# Patient Record
Sex: Male | Born: 1935 | Race: White | Hispanic: No | Marital: Married | Smoking: Never smoker
Health system: Southern US, Community
[De-identification: ages and names within clinical notes are randomized; demographics above are authoritative.]

## PROBLEM LIST (undated history)

## (undated) DIAGNOSIS — Z8719 Personal history of other diseases of the digestive system: Secondary | ICD-10-CM

## (undated) DIAGNOSIS — J189 Pneumonia, unspecified organism: Secondary | ICD-10-CM

## (undated) DIAGNOSIS — I10 Essential (primary) hypertension: Secondary | ICD-10-CM

## (undated) DIAGNOSIS — E049 Nontoxic goiter, unspecified: Secondary | ICD-10-CM

## (undated) DIAGNOSIS — I428 Other cardiomyopathies: Secondary | ICD-10-CM

## (undated) DIAGNOSIS — Z95810 Presence of automatic (implantable) cardiac defibrillator: Secondary | ICD-10-CM

## (undated) DIAGNOSIS — R0601 Orthopnea: Secondary | ICD-10-CM

## (undated) DIAGNOSIS — N400 Enlarged prostate without lower urinary tract symptoms: Secondary | ICD-10-CM

## (undated) DIAGNOSIS — J329 Chronic sinusitis, unspecified: Secondary | ICD-10-CM

## (undated) DIAGNOSIS — R0981 Nasal congestion: Secondary | ICD-10-CM

## (undated) DIAGNOSIS — I447 Left bundle-branch block, unspecified: Secondary | ICD-10-CM

## (undated) DIAGNOSIS — I728 Aneurysm of other specified arteries: Secondary | ICD-10-CM

## (undated) DIAGNOSIS — R0600 Dyspnea, unspecified: Secondary | ICD-10-CM

## (undated) DIAGNOSIS — D649 Anemia, unspecified: Secondary | ICD-10-CM

## (undated) DIAGNOSIS — Z889 Allergy status to unspecified drugs, medicaments and biological substances status: Secondary | ICD-10-CM

## (undated) DIAGNOSIS — K589 Irritable bowel syndrome without diarrhea: Secondary | ICD-10-CM

## (undated) DIAGNOSIS — W19XXXA Unspecified fall, initial encounter: Secondary | ICD-10-CM

## (undated) DIAGNOSIS — R918 Other nonspecific abnormal finding of lung field: Secondary | ICD-10-CM

## (undated) DIAGNOSIS — M1990 Unspecified osteoarthritis, unspecified site: Secondary | ICD-10-CM

## (undated) DIAGNOSIS — I4892 Unspecified atrial flutter: Secondary | ICD-10-CM

## (undated) DIAGNOSIS — Z950 Presence of cardiac pacemaker: Secondary | ICD-10-CM

## (undated) DIAGNOSIS — J984 Other disorders of lung: Secondary | ICD-10-CM

## (undated) DIAGNOSIS — K219 Gastro-esophageal reflux disease without esophagitis: Secondary | ICD-10-CM

## (undated) DIAGNOSIS — F419 Anxiety disorder, unspecified: Secondary | ICD-10-CM

## (undated) DIAGNOSIS — I5022 Chronic systolic (congestive) heart failure: Secondary | ICD-10-CM

## (undated) HISTORY — DX: Chronic sinusitis, unspecified: J32.9

## (undated) HISTORY — PX: CORONARY ANGIOPLASTY: SHX604

## (undated) HISTORY — DX: Other disorders of lung: J98.4

## (undated) HISTORY — PX: INSERT / REPLACE / REMOVE PACEMAKER: SUR710

## (undated) HISTORY — DX: Nontoxic goiter, unspecified: E04.9

## (undated) HISTORY — DX: Nasal congestion: R09.81

## (undated) HISTORY — DX: Unspecified porphyria: E80.20

## (undated) HISTORY — PX: CHOLECYSTECTOMY: SHX55

## (undated) HISTORY — DX: Other nonspecific abnormal finding of lung field: R91.8

## (undated) HISTORY — DX: Chronic systolic (congestive) heart failure: I50.22

## (undated) HISTORY — DX: Gastro-esophageal reflux disease without esophagitis: K21.9

## (undated) HISTORY — DX: Benign prostatic hyperplasia without lower urinary tract symptoms: N40.0

## (undated) HISTORY — PX: OTHER SURGICAL HISTORY: SHX169

## (undated) HISTORY — PX: HERNIA REPAIR: SHX51

## (undated) HISTORY — PX: JOINT REPLACEMENT: SHX530

## (undated) HISTORY — DX: Unspecified fall, initial encounter: W19.XXXA

## (undated) HISTORY — DX: Aneurysm of other specified arteries: I72.8

## (undated) HISTORY — DX: Irritable bowel syndrome, unspecified: K58.9

## (undated) HISTORY — DX: Personal history of other diseases of the digestive system: Z87.19

## (undated) HISTORY — DX: Left bundle-branch block, unspecified: I44.7

## (undated) HISTORY — DX: Unspecified atrial flutter: I48.92

## (undated) HISTORY — DX: Other cardiomyopathies: I42.8

## (undated) HISTORY — DX: Pneumonia, unspecified organism: J18.9

---

## 1969-04-09 HISTORY — PX: NOSE SURGERY: SHX723

## 1995-04-10 HISTORY — PX: BACK SURGERY: SHX140

## 1996-05-18 ENCOUNTER — Encounter: Payer: Self-pay | Admitting: Family Medicine

## 1996-05-18 LAB — CONVERTED CEMR LAB: TSH: 1.67 microintl units/mL

## 1997-12-08 DIAGNOSIS — K589 Irritable bowel syndrome without diarrhea: Secondary | ICD-10-CM | POA: Insufficient documentation

## 1998-01-07 HISTORY — PX: COLONOSCOPY: SHX174

## 1998-01-07 HISTORY — PX: ESOPHAGOGASTRODUODENOSCOPY: SHX1529

## 1998-01-07 LAB — HM COLONOSCOPY

## 1998-02-08 ENCOUNTER — Encounter: Payer: Self-pay | Admitting: Internal Medicine

## 1998-02-08 ENCOUNTER — Ambulatory Visit (HOSPITAL_COMMUNITY): Admission: RE | Admit: 1998-02-08 | Discharge: 1998-02-08 | Payer: Self-pay | Admitting: Internal Medicine

## 1998-02-11 ENCOUNTER — Encounter: Payer: Self-pay | Admitting: Internal Medicine

## 1998-02-11 ENCOUNTER — Ambulatory Visit (HOSPITAL_COMMUNITY): Admission: RE | Admit: 1998-02-11 | Discharge: 1998-02-11 | Payer: Self-pay | Admitting: Internal Medicine

## 1998-02-18 HISTORY — PX: ESOPHAGEAL DILATION: SHX303

## 2000-05-10 ENCOUNTER — Encounter: Payer: Self-pay | Admitting: Family Medicine

## 2000-05-10 LAB — CONVERTED CEMR LAB: PSA: 1.65 ng/mL

## 2000-05-21 ENCOUNTER — Encounter: Payer: Self-pay | Admitting: Family Medicine

## 2000-05-21 LAB — CONVERTED CEMR LAB: PSA: 1.65 ng/mL

## 2000-05-27 ENCOUNTER — Encounter: Payer: Self-pay | Admitting: Family Medicine

## 2000-05-27 LAB — CONVERTED CEMR LAB
Blood Glucose, Fasting: 81 mg/dL
RBC count: 5.02 10*6/uL
TSH: 1.746 microintl units/mL
WBC, blood: 6.4 10*3/uL

## 2001-04-08 ENCOUNTER — Ambulatory Visit (HOSPITAL_COMMUNITY): Admission: RE | Admit: 2001-04-08 | Discharge: 2001-04-08 | Payer: Self-pay | Admitting: Internal Medicine

## 2001-04-08 HISTORY — PX: ESOPHAGOGASTRODUODENOSCOPY: SHX1529

## 2002-01-30 ENCOUNTER — Ambulatory Visit (HOSPITAL_BASED_OUTPATIENT_CLINIC_OR_DEPARTMENT_OTHER): Admission: RE | Admit: 2002-01-30 | Discharge: 2002-01-30 | Payer: Self-pay | Admitting: Surgery

## 2002-01-30 HISTORY — PX: HERNIA REPAIR: SHX51

## 2002-04-09 HISTORY — PX: OTHER SURGICAL HISTORY: SHX169

## 2002-04-17 ENCOUNTER — Encounter: Payer: Self-pay | Admitting: Family Medicine

## 2002-04-17 LAB — CONVERTED CEMR LAB
Blood Glucose, Fasting: 93 mg/dL
PSA: 1.1 ng/mL
RBC count: 5.06 10*6/uL
TSH: 1.82 microintl units/mL
WBC, blood: 4.4 10*3/uL

## 2003-07-06 ENCOUNTER — Encounter: Payer: Self-pay | Admitting: Family Medicine

## 2003-07-06 LAB — CONVERTED CEMR LAB
Blood Glucose, Fasting: 89 mg/dL
RBC count: 5.3 10*6/uL
TSH: 1.71 microintl units/mL
WBC, blood: 7.2 10*3/uL

## 2003-08-06 ENCOUNTER — Other Ambulatory Visit: Payer: Self-pay

## 2004-05-31 ENCOUNTER — Ambulatory Visit: Payer: Self-pay | Admitting: Family Medicine

## 2004-06-09 ENCOUNTER — Ambulatory Visit: Payer: Self-pay | Admitting: Family Medicine

## 2004-06-15 ENCOUNTER — Ambulatory Visit: Payer: Self-pay | Admitting: Cardiology

## 2004-06-22 ENCOUNTER — Inpatient Hospital Stay (HOSPITAL_BASED_OUTPATIENT_CLINIC_OR_DEPARTMENT_OTHER): Admission: RE | Admit: 2004-06-22 | Discharge: 2004-06-22 | Payer: Self-pay | Admitting: Cardiology

## 2004-06-22 ENCOUNTER — Ambulatory Visit: Payer: Self-pay | Admitting: Cardiology

## 2004-06-22 HISTORY — PX: CARDIAC CATHETERIZATION: SHX172

## 2004-07-04 ENCOUNTER — Ambulatory Visit: Payer: Self-pay | Admitting: Cardiology

## 2004-07-13 ENCOUNTER — Ambulatory Visit: Payer: Self-pay

## 2004-07-13 HISTORY — PX: US ECHOCARDIOGRAPHY: HXRAD669

## 2004-07-27 ENCOUNTER — Ambulatory Visit: Payer: Self-pay | Admitting: Cardiology

## 2004-10-23 ENCOUNTER — Ambulatory Visit: Payer: Self-pay | Admitting: Internal Medicine

## 2004-11-06 ENCOUNTER — Ambulatory Visit: Payer: Self-pay | Admitting: Internal Medicine

## 2004-11-23 ENCOUNTER — Ambulatory Visit: Payer: Self-pay | Admitting: Family Medicine

## 2004-11-27 ENCOUNTER — Ambulatory Visit: Payer: Self-pay | Admitting: Family Medicine

## 2004-12-04 ENCOUNTER — Ambulatory Visit: Payer: Self-pay | Admitting: Cardiology

## 2004-12-07 ENCOUNTER — Emergency Department (HOSPITAL_COMMUNITY): Admission: EM | Admit: 2004-12-07 | Discharge: 2004-12-07 | Payer: Self-pay | Admitting: Emergency Medicine

## 2004-12-08 ENCOUNTER — Ambulatory Visit: Payer: Self-pay | Admitting: Family Medicine

## 2004-12-13 ENCOUNTER — Ambulatory Visit: Payer: Self-pay | Admitting: Family Medicine

## 2004-12-19 ENCOUNTER — Ambulatory Visit: Payer: Self-pay | Admitting: Internal Medicine

## 2004-12-22 ENCOUNTER — Ambulatory Visit: Payer: Self-pay | Admitting: Internal Medicine

## 2004-12-22 HISTORY — PX: ESOPHAGOGASTRODUODENOSCOPY: SHX1529

## 2005-01-03 ENCOUNTER — Ambulatory Visit: Payer: Self-pay | Admitting: Cardiology

## 2005-01-25 ENCOUNTER — Ambulatory Visit: Payer: Self-pay | Admitting: Cardiology

## 2005-03-06 ENCOUNTER — Ambulatory Visit: Payer: Self-pay | Admitting: Internal Medicine

## 2005-04-09 HISTORY — PX: PENILE PROSTHESIS IMPLANT: SHX240

## 2005-05-01 ENCOUNTER — Ambulatory Visit: Payer: Self-pay | Admitting: Cardiology

## 2005-06-01 ENCOUNTER — Ambulatory Visit (HOSPITAL_COMMUNITY): Admission: RE | Admit: 2005-06-01 | Discharge: 2005-06-02 | Payer: Self-pay | Admitting: Urology

## 2005-06-29 ENCOUNTER — Ambulatory Visit: Payer: Self-pay | Admitting: Cardiovascular Disease

## 2005-07-11 ENCOUNTER — Ambulatory Visit: Payer: Self-pay

## 2005-07-11 ENCOUNTER — Encounter: Payer: Self-pay | Admitting: Cardiology

## 2005-08-07 ENCOUNTER — Ambulatory Visit: Payer: Self-pay | Admitting: Cardiology

## 2005-08-16 ENCOUNTER — Ambulatory Visit: Payer: Self-pay | Admitting: Internal Medicine

## 2005-08-24 ENCOUNTER — Ambulatory Visit: Payer: Self-pay | Admitting: Family Medicine

## 2005-08-24 LAB — CONVERTED CEMR LAB
Blood Glucose, Fasting: 81 mg/dL
RBC count: 4 10*6/uL
WBC, blood: 5.8 10*3/uL

## 2005-08-31 ENCOUNTER — Inpatient Hospital Stay (HOSPITAL_COMMUNITY): Admission: AD | Admit: 2005-08-31 | Discharge: 2005-09-01 | Payer: Self-pay | Admitting: Internal Medicine

## 2005-08-31 ENCOUNTER — Ambulatory Visit: Payer: Self-pay | Admitting: Internal Medicine

## 2005-09-13 ENCOUNTER — Ambulatory Visit: Payer: Self-pay

## 2005-09-18 ENCOUNTER — Ambulatory Visit: Payer: Self-pay | Admitting: Cardiology

## 2005-10-12 ENCOUNTER — Ambulatory Visit: Payer: Self-pay | Admitting: Family Medicine

## 2005-10-12 LAB — CONVERTED CEMR LAB
Blood Glucose, Fasting: 73 mg/dL
PSA: 1.11 ng/mL
RBC count: 4 10*6/uL
TSH: 1.88 microintl units/mL
WBC, blood: 4.4 10*3/uL

## 2005-10-15 ENCOUNTER — Ambulatory Visit: Payer: Self-pay | Admitting: Family Medicine

## 2005-10-22 ENCOUNTER — Ambulatory Visit: Payer: Self-pay | Admitting: Internal Medicine

## 2005-11-27 ENCOUNTER — Ambulatory Visit: Payer: Self-pay | Admitting: Internal Medicine

## 2005-11-29 ENCOUNTER — Ambulatory Visit: Payer: Self-pay | Admitting: Family Medicine

## 2005-12-06 ENCOUNTER — Ambulatory Visit: Payer: Self-pay | Admitting: Internal Medicine

## 2006-03-12 ENCOUNTER — Ambulatory Visit: Payer: Self-pay | Admitting: Internal Medicine

## 2006-06-21 ENCOUNTER — Ambulatory Visit: Payer: Self-pay | Admitting: Internal Medicine

## 2006-08-12 ENCOUNTER — Ambulatory Visit: Payer: Self-pay | Admitting: Cardiology

## 2006-08-29 ENCOUNTER — Ambulatory Visit: Payer: Self-pay | Admitting: Cardiology

## 2006-10-02 ENCOUNTER — Ambulatory Visit: Payer: Self-pay | Admitting: Cardiology

## 2006-10-30 ENCOUNTER — Ambulatory Visit: Payer: Self-pay | Admitting: Cardiology

## 2006-11-12 ENCOUNTER — Ambulatory Visit: Payer: Self-pay | Admitting: Internal Medicine

## 2006-11-18 ENCOUNTER — Ambulatory Visit: Payer: Self-pay | Admitting: Cardiology

## 2006-11-27 ENCOUNTER — Ambulatory Visit: Payer: Self-pay

## 2006-11-27 ENCOUNTER — Encounter: Payer: Self-pay | Admitting: Cardiology

## 2006-11-27 HISTORY — PX: US ECHOCARDIOGRAPHY: HXRAD669

## 2006-11-29 ENCOUNTER — Ambulatory Visit: Payer: Self-pay | Admitting: Cardiology

## 2006-12-16 ENCOUNTER — Encounter: Payer: Self-pay | Admitting: Family Medicine

## 2006-12-16 ENCOUNTER — Ambulatory Visit: Payer: Self-pay | Admitting: Family Medicine

## 2006-12-16 DIAGNOSIS — I5022 Chronic systolic (congestive) heart failure: Secondary | ICD-10-CM | POA: Insufficient documentation

## 2006-12-16 DIAGNOSIS — N400 Enlarged prostate without lower urinary tract symptoms: Secondary | ICD-10-CM | POA: Insufficient documentation

## 2006-12-16 DIAGNOSIS — J309 Allergic rhinitis, unspecified: Secondary | ICD-10-CM | POA: Insufficient documentation

## 2006-12-16 DIAGNOSIS — K219 Gastro-esophageal reflux disease without esophagitis: Secondary | ICD-10-CM | POA: Insufficient documentation

## 2006-12-16 DIAGNOSIS — E785 Hyperlipidemia, unspecified: Secondary | ICD-10-CM | POA: Insufficient documentation

## 2007-01-09 ENCOUNTER — Encounter: Payer: Self-pay | Admitting: Family Medicine

## 2007-01-16 ENCOUNTER — Ambulatory Visit: Payer: Self-pay | Admitting: Internal Medicine

## 2007-01-17 ENCOUNTER — Ambulatory Visit: Payer: Self-pay | Admitting: Cardiology

## 2007-02-11 ENCOUNTER — Ambulatory Visit: Payer: Self-pay | Admitting: Internal Medicine

## 2007-05-13 ENCOUNTER — Ambulatory Visit: Payer: Self-pay | Admitting: Internal Medicine

## 2007-05-15 ENCOUNTER — Ambulatory Visit: Payer: Self-pay | Admitting: Cardiology

## 2007-08-12 ENCOUNTER — Ambulatory Visit: Payer: Self-pay | Admitting: Internal Medicine

## 2007-09-04 ENCOUNTER — Encounter: Payer: Self-pay | Admitting: Family Medicine

## 2007-10-13 ENCOUNTER — Ambulatory Visit: Payer: Self-pay | Admitting: Family Medicine

## 2007-10-13 LAB — CONVERTED CEMR LAB
ALT: 14 units/L (ref 0–53)
AST: 15 units/L (ref 0–37)
Albumin: 3.9 g/dL (ref 3.5–5.2)
Alkaline Phosphatase: 64 units/L (ref 39–117)
BUN: 20 mg/dL (ref 6–23)
Basophils Absolute: 0 10*3/uL (ref 0.0–0.1)
Basophils Relative: 0.9 % (ref 0.0–1.0)
Bilirubin, Direct: 0.2 mg/dL (ref 0.0–0.3)
CO2: 31 meq/L (ref 19–32)
Calcium: 9.2 mg/dL (ref 8.4–10.5)
Chloride: 105 meq/L (ref 96–112)
Cholesterol: 154 mg/dL (ref 0–200)
Creatinine, Ser: 1.1 mg/dL (ref 0.4–1.5)
Eosinophils Absolute: 0.2 10*3/uL (ref 0.0–0.7)
Eosinophils Relative: 3.8 % (ref 0.0–5.0)
GFR calc Af Amer: 84 mL/min
GFR calc non Af Amer: 70 mL/min
Glucose, Bld: 92 mg/dL (ref 70–99)
HCT: 36.5 % — ABNORMAL LOW (ref 39.0–52.0)
HDL: 35.7 mg/dL — ABNORMAL LOW (ref >39.0)
Hemoglobin: 12.9 g/dL — ABNORMAL LOW (ref 13.0–17.0)
LDL Cholesterol: 100 mg/dL — ABNORMAL HIGH (ref 0–99)
Lymphocytes Relative: 30.7 % (ref 12.0–46.0)
MCHC: 35.3 g/dL (ref 30.0–36.0)
MCV: 85.3 fL (ref 78.0–100.0)
Monocytes Absolute: 0.4 10*3/uL (ref 0.1–1.0)
Monocytes Relative: 8.1 % (ref 3.0–12.0)
Neutro Abs: 2.7 10*3/uL (ref 1.4–7.7)
Neutrophils Relative %: 56.5 % (ref 43.0–77.0)
PSA: 1.69 ng/mL (ref 0.10–4.00)
Platelets: 140 10*3/uL — ABNORMAL LOW (ref 150–400)
Potassium: 4.9 meq/L (ref 3.5–5.1)
RBC: 4.28 M/uL (ref 4.22–5.81)
RDW: 13.7 % (ref 11.5–14.6)
Sodium: 141 meq/L (ref 135–145)
T4, Total: 7 ug/dL (ref 5.0–12.5)
TSH: 2.22 microintl units/mL (ref 0.35–5.50)
Total Bilirubin: 0.9 mg/dL (ref 0.3–1.2)
Total CHOL/HDL Ratio: 4.3
Total Protein: 6.5 g/dL (ref 6.0–8.3)
Triglycerides: 91 mg/dL (ref 0–149)
VLDL: 18 mg/dL (ref 0–40)
WBC: 4.8 10*3/uL (ref 4.5–10.5)

## 2007-10-17 ENCOUNTER — Ambulatory Visit: Payer: Self-pay | Admitting: Family Medicine

## 2007-10-31 ENCOUNTER — Ambulatory Visit: Payer: Self-pay | Admitting: Family Medicine

## 2007-10-31 LAB — CONVERTED CEMR LAB
OCCULT 1: NEGATIVE
OCCULT 2: NEGATIVE
OCCULT 3: NEGATIVE

## 2007-10-31 LAB — FECAL OCCULT BLOOD, GUAIAC: Fecal Occult Blood: NEGATIVE

## 2007-11-03 ENCOUNTER — Encounter (INDEPENDENT_AMBULATORY_CARE_PROVIDER_SITE_OTHER): Payer: Self-pay | Admitting: *Deleted

## 2007-11-14 ENCOUNTER — Ambulatory Visit: Payer: Self-pay | Admitting: Cardiology

## 2007-12-08 ENCOUNTER — Ambulatory Visit: Payer: Self-pay | Admitting: Internal Medicine

## 2008-01-13 ENCOUNTER — Ambulatory Visit: Payer: Self-pay | Admitting: Cardiology

## 2008-03-19 ENCOUNTER — Ambulatory Visit: Payer: Self-pay | Admitting: Internal Medicine

## 2008-04-05 ENCOUNTER — Telehealth: Payer: Self-pay | Admitting: Family Medicine

## 2008-04-09 DIAGNOSIS — K219 Gastro-esophageal reflux disease without esophagitis: Secondary | ICD-10-CM

## 2008-04-09 HISTORY — DX: Gastro-esophageal reflux disease without esophagitis: K21.9

## 2008-05-04 ENCOUNTER — Encounter: Payer: Self-pay | Admitting: Family Medicine

## 2008-05-13 ENCOUNTER — Ambulatory Visit: Payer: Self-pay | Admitting: Family Medicine

## 2008-06-17 ENCOUNTER — Encounter: Payer: Self-pay | Admitting: Internal Medicine

## 2008-06-30 ENCOUNTER — Ambulatory Visit: Payer: Self-pay | Admitting: Internal Medicine

## 2008-08-04 ENCOUNTER — Encounter: Payer: Self-pay | Admitting: Cardiology

## 2008-08-23 ENCOUNTER — Ambulatory Visit: Payer: Self-pay | Admitting: Family Medicine

## 2008-09-13 DIAGNOSIS — I447 Left bundle-branch block, unspecified: Secondary | ICD-10-CM | POA: Insufficient documentation

## 2008-09-13 DIAGNOSIS — Z95810 Presence of automatic (implantable) cardiac defibrillator: Secondary | ICD-10-CM | POA: Insufficient documentation

## 2008-09-13 DIAGNOSIS — I428 Other cardiomyopathies: Secondary | ICD-10-CM | POA: Insufficient documentation

## 2008-09-13 DIAGNOSIS — I429 Cardiomyopathy, unspecified: Secondary | ICD-10-CM | POA: Insufficient documentation

## 2008-09-16 ENCOUNTER — Encounter: Payer: Self-pay | Admitting: Family Medicine

## 2008-09-22 ENCOUNTER — Ambulatory Visit: Payer: Self-pay | Admitting: Family Medicine

## 2008-09-22 DIAGNOSIS — R1013 Epigastric pain: Secondary | ICD-10-CM | POA: Insufficient documentation

## 2008-09-28 ENCOUNTER — Ambulatory Visit: Payer: Self-pay | Admitting: Internal Medicine

## 2008-10-11 ENCOUNTER — Encounter: Payer: Self-pay | Admitting: Internal Medicine

## 2008-10-12 ENCOUNTER — Encounter: Payer: Self-pay | Admitting: Family Medicine

## 2008-10-14 ENCOUNTER — Ambulatory Visit: Payer: Self-pay | Admitting: Unknown Physician Specialty

## 2008-10-21 ENCOUNTER — Encounter: Payer: Self-pay | Admitting: Family Medicine

## 2008-10-21 ENCOUNTER — Ambulatory Visit: Payer: Self-pay | Admitting: Unknown Physician Specialty

## 2008-10-21 HISTORY — PX: ESOPHAGOGASTRODUODENOSCOPY: SHX1529

## 2008-10-21 HISTORY — PX: COLONOSCOPY: SHX174

## 2008-11-10 ENCOUNTER — Ambulatory Visit: Payer: Self-pay | Admitting: Unknown Physician Specialty

## 2008-11-24 ENCOUNTER — Encounter (INDEPENDENT_AMBULATORY_CARE_PROVIDER_SITE_OTHER): Payer: Self-pay | Admitting: *Deleted

## 2008-12-03 ENCOUNTER — Ambulatory Visit: Payer: Self-pay | Admitting: Family Medicine

## 2008-12-04 LAB — CONVERTED CEMR LAB
ALT: 16 units/L (ref 0–53)
AST: 17 units/L (ref 0–37)
Albumin: 3.9 g/dL (ref 3.5–5.2)
Alkaline Phosphatase: 62 units/L (ref 39–117)
BUN: 14 mg/dL (ref 6–23)
Basophils Absolute: 0 10*3/uL (ref 0.0–0.1)
Basophils Relative: 0.4 % (ref 0.0–3.0)
Bilirubin, Direct: 0.1 mg/dL (ref 0.0–0.3)
CO2: 29 meq/L (ref 19–32)
Calcium: 9.1 mg/dL (ref 8.4–10.5)
Chloride: 108 meq/L (ref 96–112)
Cholesterol: 149 mg/dL (ref 0–200)
Creatinine, Ser: 1.1 mg/dL (ref 0.4–1.5)
Eosinophils Absolute: 0.3 10*3/uL (ref 0.0–0.7)
Eosinophils Relative: 5.3 % — ABNORMAL HIGH (ref 0.0–5.0)
GFR calc non Af Amer: 69.32 mL/min (ref >60)
Glucose, Bld: 95 mg/dL (ref 70–99)
HCT: 37.5 % — ABNORMAL LOW (ref 39.0–52.0)
HDL: 43.1 mg/dL (ref >39.00)
Hemoglobin: 12.8 g/dL — ABNORMAL LOW (ref 13.0–17.0)
Iron: 71 ug/dL (ref 42–165)
LDL Cholesterol: 89 mg/dL (ref 0–99)
Lymphocytes Relative: 26.4 % (ref 12.0–46.0)
Lymphs Abs: 1.4 10*3/uL (ref 0.7–4.0)
MCHC: 34.2 g/dL (ref 30.0–36.0)
MCV: 87.9 fL (ref 78.0–100.0)
Monocytes Absolute: 0.4 10*3/uL (ref 0.1–1.0)
Monocytes Relative: 8.4 % (ref 3.0–12.0)
Neutro Abs: 3.2 10*3/uL (ref 1.4–7.7)
Neutrophils Relative %: 59.5 % (ref 43.0–77.0)
PSA: 1.44 ng/mL (ref 0.10–4.00)
Platelets: 121 10*3/uL — ABNORMAL LOW (ref 150.0–400.0)
Potassium: 4.6 meq/L (ref 3.5–5.1)
RBC: 4.27 M/uL (ref 4.22–5.81)
RDW: 14.7 % — ABNORMAL HIGH (ref 11.5–14.6)
Sodium: 141 meq/L (ref 135–145)
Total Bilirubin: 1 mg/dL (ref 0.3–1.2)
Total CHOL/HDL Ratio: 3
Total Protein: 6.3 g/dL (ref 6.0–8.3)
Triglycerides: 83 mg/dL (ref 0.0–149.0)
VLDL: 16.6 mg/dL (ref 0.0–40.0)
Vitamin B-12: 192 pg/mL — ABNORMAL LOW (ref 211–911)
WBC: 5.3 10*3/uL (ref 4.5–10.5)

## 2008-12-08 ENCOUNTER — Ambulatory Visit: Payer: Self-pay | Admitting: Family Medicine

## 2008-12-08 DIAGNOSIS — F419 Anxiety disorder, unspecified: Secondary | ICD-10-CM | POA: Insufficient documentation

## 2008-12-20 ENCOUNTER — Ambulatory Visit: Payer: Self-pay | Admitting: Internal Medicine

## 2008-12-20 DIAGNOSIS — I4892 Unspecified atrial flutter: Secondary | ICD-10-CM | POA: Insufficient documentation

## 2008-12-27 ENCOUNTER — Ambulatory Visit: Payer: Self-pay | Admitting: Internal Medicine

## 2008-12-27 LAB — CONVERTED CEMR LAB: POC INR: 2.2

## 2009-01-03 ENCOUNTER — Ambulatory Visit: Payer: Self-pay | Admitting: Internal Medicine

## 2009-01-03 LAB — CONVERTED CEMR LAB: POC INR: 2.1

## 2009-01-12 ENCOUNTER — Ambulatory Visit: Payer: Self-pay | Admitting: Cardiology

## 2009-01-17 ENCOUNTER — Ambulatory Visit: Payer: Self-pay | Admitting: Internal Medicine

## 2009-01-17 LAB — CONVERTED CEMR LAB: POC INR: 1.5

## 2009-01-24 ENCOUNTER — Ambulatory Visit: Payer: Self-pay | Admitting: Internal Medicine

## 2009-01-24 LAB — CONVERTED CEMR LAB: POC INR: 2

## 2009-02-04 ENCOUNTER — Ambulatory Visit: Payer: Self-pay | Admitting: Cardiology

## 2009-02-04 LAB — CONVERTED CEMR LAB: POC INR: 2

## 2009-02-11 ENCOUNTER — Ambulatory Visit: Payer: Self-pay | Admitting: Cardiovascular Disease

## 2009-02-11 LAB — CONVERTED CEMR LAB: POC INR: 2

## 2009-02-14 ENCOUNTER — Ambulatory Visit: Payer: Self-pay | Admitting: Internal Medicine

## 2009-02-18 ENCOUNTER — Ambulatory Visit: Payer: Self-pay | Admitting: Internal Medicine

## 2009-02-18 LAB — CONVERTED CEMR LAB
BUN: 18 mg/dL (ref 6–23)
CO2: 27 meq/L (ref 19–32)
Calcium: 8.9 mg/dL (ref 8.4–10.5)
Chloride: 104 meq/L (ref 96–112)
Creatinine, Ser: 0.98 mg/dL (ref 0.40–1.50)
Glucose, Bld: 90 mg/dL (ref 70–99)
HCT: 39.6 % (ref 39.0–52.0)
Hemoglobin: 13.3 g/dL (ref 13.0–17.0)
INR: 2.25 — ABNORMAL HIGH (ref <1.50)
MCHC: 33.6 g/dL (ref 30.0–36.0)
MCV: 85 fL (ref 78.0–100.0)
POC INR: 2.3
Platelets: 153 10*3/uL (ref 150–400)
Potassium: 4.6 meq/L (ref 3.5–5.3)
Prothrombin Time: 24.7 s — ABNORMAL HIGH (ref 11.6–15.2)
RBC: 4.66 M/uL (ref 4.22–5.81)
RDW: 15 % (ref 11.5–15.5)
Sodium: 140 meq/L (ref 135–145)
WBC: 5.3 10*3/uL (ref 4.0–10.5)

## 2009-02-22 ENCOUNTER — Ambulatory Visit (HOSPITAL_COMMUNITY): Admission: RE | Admit: 2009-02-22 | Discharge: 2009-02-22 | Payer: Self-pay | Admitting: Internal Medicine

## 2009-02-22 ENCOUNTER — Ambulatory Visit: Payer: Self-pay | Admitting: Internal Medicine

## 2009-02-22 HISTORY — PX: CARDIOVERSION: SHX1299

## 2009-02-23 ENCOUNTER — Telehealth: Payer: Self-pay | Admitting: Cardiology

## 2009-03-02 ENCOUNTER — Ambulatory Visit: Payer: Self-pay | Admitting: Internal Medicine

## 2009-03-02 ENCOUNTER — Encounter: Payer: Self-pay | Admitting: Internal Medicine

## 2009-03-02 LAB — CONVERTED CEMR LAB: POC INR: 1.7

## 2009-03-14 ENCOUNTER — Ambulatory Visit: Payer: Self-pay

## 2009-03-14 ENCOUNTER — Ambulatory Visit: Payer: Self-pay | Admitting: Internal Medicine

## 2009-03-14 LAB — CONVERTED CEMR LAB: POC INR: 2

## 2009-03-16 ENCOUNTER — Ambulatory Visit: Payer: Self-pay | Admitting: Internal Medicine

## 2009-04-05 ENCOUNTER — Telehealth: Payer: Self-pay | Admitting: Internal Medicine

## 2009-04-07 ENCOUNTER — Telehealth: Payer: Self-pay | Admitting: Internal Medicine

## 2009-04-12 ENCOUNTER — Encounter: Payer: Self-pay | Admitting: Internal Medicine

## 2009-04-20 ENCOUNTER — Telehealth: Payer: Self-pay | Admitting: Internal Medicine

## 2009-04-21 ENCOUNTER — Ambulatory Visit: Payer: Self-pay | Admitting: Internal Medicine

## 2009-05-03 ENCOUNTER — Ambulatory Visit: Payer: Self-pay | Admitting: Cardiology

## 2009-05-04 LAB — CONVERTED CEMR LAB: aPTT: 44 s — ABNORMAL HIGH (ref 24–37)

## 2009-05-27 ENCOUNTER — Telehealth: Payer: Self-pay | Admitting: Internal Medicine

## 2009-05-30 ENCOUNTER — Ambulatory Visit: Payer: Self-pay | Admitting: Pain Medicine

## 2009-05-30 ENCOUNTER — Ambulatory Visit: Payer: Self-pay | Admitting: Cardiovascular Disease

## 2009-05-30 ENCOUNTER — Encounter: Payer: Self-pay | Admitting: Family Medicine

## 2009-05-30 LAB — CONVERTED CEMR LAB: POC INR: 2.1

## 2009-06-06 ENCOUNTER — Encounter: Payer: Self-pay | Admitting: Cardiovascular Disease

## 2009-06-06 ENCOUNTER — Ambulatory Visit: Payer: Self-pay | Admitting: Cardiovascular Disease

## 2009-06-06 LAB — CONVERTED CEMR LAB: POC INR: 2.4

## 2009-06-09 ENCOUNTER — Encounter: Payer: Self-pay | Admitting: Family Medicine

## 2009-06-14 ENCOUNTER — Ambulatory Visit: Payer: Self-pay | Admitting: Internal Medicine

## 2009-06-21 ENCOUNTER — Encounter: Payer: Self-pay | Admitting: Internal Medicine

## 2009-07-04 ENCOUNTER — Ambulatory Visit: Payer: Self-pay | Admitting: Cardiovascular Disease

## 2009-07-04 LAB — CONVERTED CEMR LAB: POC INR: 1.8

## 2009-07-14 ENCOUNTER — Encounter: Payer: Self-pay | Admitting: Internal Medicine

## 2009-08-01 ENCOUNTER — Ambulatory Visit: Payer: Self-pay | Admitting: Internal Medicine

## 2009-08-01 LAB — CONVERTED CEMR LAB: POC INR: 1.7

## 2009-08-29 ENCOUNTER — Ambulatory Visit: Payer: Self-pay | Admitting: Cardiovascular Disease

## 2009-08-29 LAB — CONVERTED CEMR LAB: POC INR: 1.7

## 2009-09-13 ENCOUNTER — Ambulatory Visit: Payer: Self-pay | Admitting: Internal Medicine

## 2009-09-19 ENCOUNTER — Ambulatory Visit: Payer: Self-pay | Admitting: Cardiovascular Disease

## 2009-09-19 LAB — CONVERTED CEMR LAB: POC INR: 1.8

## 2009-10-03 ENCOUNTER — Ambulatory Visit: Payer: Self-pay | Admitting: Cardiovascular Disease

## 2009-10-03 LAB — CONVERTED CEMR LAB: POC INR: 2.1

## 2009-10-21 ENCOUNTER — Encounter: Payer: Self-pay | Admitting: Internal Medicine

## 2009-10-27 ENCOUNTER — Encounter: Payer: Self-pay | Admitting: Internal Medicine

## 2009-11-02 ENCOUNTER — Ambulatory Visit: Payer: Self-pay | Admitting: Cardiovascular Disease

## 2009-11-02 LAB — CONVERTED CEMR LAB: POC INR: 1.6

## 2009-11-10 ENCOUNTER — Encounter (INDEPENDENT_AMBULATORY_CARE_PROVIDER_SITE_OTHER): Payer: Self-pay | Admitting: *Deleted

## 2009-11-16 ENCOUNTER — Ambulatory Visit: Payer: Self-pay | Admitting: Cardiovascular Disease

## 2009-11-16 LAB — CONVERTED CEMR LAB: POC INR: 2.2

## 2009-12-14 ENCOUNTER — Ambulatory Visit: Payer: Self-pay | Admitting: Cardiovascular Disease

## 2009-12-14 LAB — CONVERTED CEMR LAB: POC INR: 2.8

## 2009-12-15 ENCOUNTER — Encounter: Payer: Self-pay | Admitting: Family Medicine

## 2009-12-15 ENCOUNTER — Ambulatory Visit: Payer: Self-pay | Admitting: Internal Medicine

## 2009-12-26 ENCOUNTER — Encounter: Payer: Self-pay | Admitting: Internal Medicine

## 2010-01-03 ENCOUNTER — Encounter: Payer: Self-pay | Admitting: Internal Medicine

## 2010-01-03 ENCOUNTER — Ambulatory Visit: Payer: Self-pay | Admitting: Cardiology

## 2010-01-03 ENCOUNTER — Encounter: Payer: Self-pay | Admitting: Cardiovascular Disease

## 2010-01-03 ENCOUNTER — Inpatient Hospital Stay: Payer: Self-pay | Admitting: Internal Medicine

## 2010-01-03 DIAGNOSIS — R079 Chest pain, unspecified: Secondary | ICD-10-CM | POA: Insufficient documentation

## 2010-01-03 LAB — CONVERTED CEMR LAB: POC INR: 2

## 2010-01-04 ENCOUNTER — Encounter: Payer: Self-pay | Admitting: Family Medicine

## 2010-01-04 ENCOUNTER — Encounter: Payer: Self-pay | Admitting: Cardiology

## 2010-01-04 LAB — CONVERTED CEMR LAB
BUN: 19 mg/dL (ref 6–23)
Basophils Absolute: 0 10*3/uL (ref 0.0–0.1)
Basophils Relative: 0.4 % (ref 0.0–3.0)
CO2: 31 meq/L (ref 19–32)
Calcium: 9.3 mg/dL (ref 8.4–10.5)
Chloride: 99 meq/L (ref 96–112)
Creatinine, Ser: 1.1 mg/dL (ref 0.4–1.5)
Eosinophils Absolute: 0.1 10*3/uL (ref 0.0–0.7)
Eosinophils Relative: 0.9 % (ref 0.0–5.0)
GFR calc non Af Amer: 66.33 mL/min (ref >60)
Glucose, Bld: 105 mg/dL — ABNORMAL HIGH (ref 70–99)
HCT: 39.7 % (ref 39.0–52.0)
Hemoglobin: 13.6 g/dL (ref 13.0–17.0)
Lymphocytes Relative: 18.1 % (ref 12.0–46.0)
Lymphs Abs: 1.7 10*3/uL (ref 0.7–4.0)
MCHC: 34.4 g/dL (ref 30.0–36.0)
MCV: 88.7 fL (ref 78.0–100.0)
Monocytes Absolute: 0.8 10*3/uL (ref 0.1–1.0)
Monocytes Relative: 8.8 % (ref 3.0–12.0)
Neutro Abs: 6.9 10*3/uL (ref 1.4–7.7)
Neutrophils Relative %: 71.8 % (ref 43.0–77.0)
Platelets: 135 10*3/uL — ABNORMAL LOW (ref 150.0–400.0)
Potassium: 4.3 meq/L (ref 3.5–5.1)
RBC: 4.47 M/uL (ref 4.22–5.81)
RDW: 15.5 % — ABNORMAL HIGH (ref 11.5–14.6)
Sodium: 137 meq/L (ref 135–145)
WBC: 9.6 10*3/uL (ref 4.5–10.5)

## 2010-01-16 ENCOUNTER — Ambulatory Visit: Payer: Self-pay | Admitting: Family Medicine

## 2010-01-17 LAB — CONVERTED CEMR LAB
Basophils Absolute: 0.1 10*3/uL (ref 0.0–0.1)
Basophils Relative: 1.3 % (ref 0.0–3.0)
Eosinophils Absolute: 0.1 10*3/uL (ref 0.0–0.7)
Eosinophils Relative: 2.2 % (ref 0.0–5.0)
HCT: 37.5 % — ABNORMAL LOW (ref 39.0–52.0)
Hemoglobin: 12.9 g/dL — ABNORMAL LOW (ref 13.0–17.0)
Lymphocytes Relative: 25.3 % (ref 12.0–46.0)
Lymphs Abs: 1.5 10*3/uL (ref 0.7–4.0)
MCHC: 34.4 g/dL (ref 30.0–36.0)
MCV: 89.3 fL (ref 78.0–100.0)
Monocytes Absolute: 0.3 10*3/uL (ref 0.1–1.0)
Monocytes Relative: 5.7 % (ref 3.0–12.0)
Neutro Abs: 4 10*3/uL (ref 1.4–7.7)
Neutrophils Relative %: 65.5 % (ref 43.0–77.0)
Platelets: 121 10*3/uL — ABNORMAL LOW (ref 150.0–400.0)
RBC: 4.2 M/uL — ABNORMAL LOW (ref 4.22–5.81)
RDW: 15.3 % — ABNORMAL HIGH (ref 11.5–14.6)
WBC: 6.1 10*3/uL (ref 4.5–10.5)

## 2010-01-18 ENCOUNTER — Ambulatory Visit: Payer: Self-pay | Admitting: Cardiology

## 2010-02-01 ENCOUNTER — Ambulatory Visit: Payer: Self-pay | Admitting: Cardiovascular Disease

## 2010-02-01 LAB — CONVERTED CEMR LAB: POC INR: 2.4

## 2010-02-08 ENCOUNTER — Telehealth: Payer: Self-pay | Admitting: Cardiology

## 2010-02-09 ENCOUNTER — Ambulatory Visit: Payer: Self-pay | Admitting: Family Medicine

## 2010-02-10 LAB — CONVERTED CEMR LAB
ALT: 16 units/L (ref 0–53)
AST: 19 units/L (ref 0–37)
Albumin: 3.9 g/dL (ref 3.5–5.2)
Alkaline Phosphatase: 58 units/L (ref 39–117)
BUN: 17 mg/dL (ref 6–23)
Basophils Absolute: 0.1 10*3/uL (ref 0.0–0.1)
Basophils Relative: 1.3 % (ref 0.0–3.0)
Bilirubin, Direct: 0.1 mg/dL (ref 0.0–0.3)
CO2: 31 meq/L (ref 19–32)
Calcium: 9.3 mg/dL (ref 8.4–10.5)
Chloride: 105 meq/L (ref 96–112)
Cholesterol: 191 mg/dL (ref 0–200)
Creatinine, Ser: 1 mg/dL (ref 0.4–1.5)
Eosinophils Absolute: 0.2 10*3/uL (ref 0.0–0.7)
Eosinophils Relative: 3.5 % (ref 0.0–5.0)
Free T4: 0.85 ng/dL (ref 0.60–1.60)
GFR calc non Af Amer: 78.96 mL/min (ref >60)
Glucose, Bld: 93 mg/dL (ref 70–99)
HCT: 41.7 % (ref 39.0–52.0)
HDL: 42.5 mg/dL (ref >39.00)
Hemoglobin: 14.4 g/dL (ref 13.0–17.0)
LDL Cholesterol: 119 mg/dL — ABNORMAL HIGH (ref 0–99)
Lymphocytes Relative: 26.8 % (ref 12.0–46.0)
Lymphs Abs: 1.5 10*3/uL (ref 0.7–4.0)
MCHC: 34.4 g/dL (ref 30.0–36.0)
MCV: 88.2 fL (ref 78.0–100.0)
Monocytes Absolute: 0.4 10*3/uL (ref 0.1–1.0)
Monocytes Relative: 8 % (ref 3.0–12.0)
Neutro Abs: 3.3 10*3/uL (ref 1.4–7.7)
Neutrophils Relative %: 60.4 % (ref 43.0–77.0)
PSA: 2.25 ng/mL (ref 0.10–4.00)
Platelets: 113 10*3/uL — ABNORMAL LOW (ref 150.0–400.0)
Potassium: 4.5 meq/L (ref 3.5–5.1)
RBC: 4.73 M/uL (ref 4.22–5.81)
RDW: 15 % — ABNORMAL HIGH (ref 11.5–14.6)
Sodium: 141 meq/L (ref 135–145)
TSH: 2 microintl units/mL (ref 0.35–5.50)
Total Bilirubin: 1 mg/dL (ref 0.3–1.2)
Total CHOL/HDL Ratio: 4
Total Protein: 6.5 g/dL (ref 6.0–8.3)
Triglycerides: 148 mg/dL (ref 0.0–149.0)
VLDL: 29.6 mg/dL (ref 0.0–40.0)
WBC: 5.5 10*3/uL (ref 4.5–10.5)

## 2010-02-16 ENCOUNTER — Ambulatory Visit: Payer: Self-pay | Admitting: Family Medicine

## 2010-02-16 DIAGNOSIS — K222 Esophageal obstruction: Secondary | ICD-10-CM | POA: Insufficient documentation

## 2010-02-22 ENCOUNTER — Telehealth: Payer: Self-pay | Admitting: Cardiology

## 2010-03-01 ENCOUNTER — Ambulatory Visit: Payer: Self-pay | Admitting: Cardiovascular Disease

## 2010-03-01 LAB — CONVERTED CEMR LAB: POC INR: 2.4

## 2010-03-09 ENCOUNTER — Telehealth: Payer: Self-pay | Admitting: Cardiology

## 2010-03-20 ENCOUNTER — Ambulatory Visit: Payer: Self-pay | Admitting: Internal Medicine

## 2010-03-20 ENCOUNTER — Encounter: Payer: Self-pay | Admitting: Internal Medicine

## 2010-03-20 LAB — CONVERTED CEMR LAB
BUN: 16 mg/dL (ref 6–23)
CO2: 29 meq/L (ref 19–32)
Calcium: 9.2 mg/dL (ref 8.4–10.5)
Chloride: 106 meq/L (ref 96–112)
Creatinine, Ser: 0.94 mg/dL (ref 0.40–1.50)
Glucose, Bld: 90 mg/dL (ref 70–99)
INR: 1.66 — ABNORMAL HIGH (ref <1.50)
Magnesium: 2.1 mg/dL (ref 1.5–2.5)
POC INR: 1.66
Potassium: 5.1 meq/L (ref 3.5–5.3)
Prothrombin Time: 19.8 s
Prothrombin Time: 19.8 s — ABNORMAL HIGH (ref 11.6–15.2)
Sodium: 140 meq/L (ref 135–145)
aPTT: 35 s (ref 24–37)

## 2010-03-22 ENCOUNTER — Encounter: Payer: Self-pay | Admitting: Internal Medicine

## 2010-03-23 ENCOUNTER — Ambulatory Visit: Payer: Self-pay | Admitting: Internal Medicine

## 2010-03-23 ENCOUNTER — Telehealth: Payer: Self-pay | Admitting: Internal Medicine

## 2010-03-29 ENCOUNTER — Ambulatory Visit: Payer: Self-pay | Admitting: Cardiovascular Disease

## 2010-03-29 LAB — CONVERTED CEMR LAB: POC INR: 1.9

## 2010-04-05 ENCOUNTER — Ambulatory Visit: Payer: Self-pay | Admitting: Cardiovascular Disease

## 2010-04-05 LAB — CONVERTED CEMR LAB: POC INR: 2.3

## 2010-04-12 ENCOUNTER — Ambulatory Visit: Admission: RE | Admit: 2010-04-12 | Discharge: 2010-04-12 | Payer: Self-pay | Source: Home / Self Care

## 2010-04-12 LAB — CONVERTED CEMR LAB: POC INR: 2.1

## 2010-04-19 ENCOUNTER — Ambulatory Visit: Admission: RE | Admit: 2010-04-19 | Discharge: 2010-04-19 | Payer: Self-pay | Source: Home / Self Care

## 2010-04-19 LAB — CONVERTED CEMR LAB: POC INR: 2.8

## 2010-04-25 ENCOUNTER — Ambulatory Visit: Admission: RE | Admit: 2010-04-25 | Discharge: 2010-04-25 | Payer: Self-pay | Source: Home / Self Care

## 2010-04-25 ENCOUNTER — Ambulatory Visit
Admission: RE | Admit: 2010-04-25 | Discharge: 2010-04-25 | Payer: Self-pay | Source: Home / Self Care | Attending: Internal Medicine | Admitting: Internal Medicine

## 2010-04-25 ENCOUNTER — Other Ambulatory Visit: Payer: Self-pay | Admitting: Internal Medicine

## 2010-04-25 LAB — BASIC METABOLIC PANEL
BUN: 16 mg/dL (ref 6–23)
CO2: 30 mEq/L (ref 19–32)
Calcium: 8.7 mg/dL (ref 8.4–10.5)
Chloride: 105 mEq/L (ref 96–112)
Creatinine, Ser: 1 mg/dL (ref 0.4–1.5)
GFR: 76.21 mL/min (ref >60.00)
Glucose, Bld: 75 mg/dL (ref 70–99)
Potassium: 4.7 mEq/L (ref 3.5–5.1)
Sodium: 141 mEq/L (ref 135–145)

## 2010-04-25 LAB — PROTIME-INR
INR: 3.5 ratio — ABNORMAL HIGH (ref 0.8–1.0)
Prothrombin Time: 34.4 s — ABNORMAL HIGH (ref 10.2–12.4)

## 2010-04-25 LAB — MAGNESIUM: Magnesium: 2.2 mg/dL (ref 1.5–2.5)

## 2010-04-26 ENCOUNTER — Telehealth: Payer: Self-pay | Admitting: Internal Medicine

## 2010-04-26 ENCOUNTER — Ambulatory Visit: Admit: 2010-04-26 | Payer: Self-pay

## 2010-04-26 ENCOUNTER — Inpatient Hospital Stay (HOSPITAL_COMMUNITY)
Admission: AD | Admit: 2010-04-26 | Discharge: 2010-04-29 | Payer: Self-pay | Source: Home / Self Care | Attending: Internal Medicine | Admitting: Internal Medicine

## 2010-04-28 NOTE — H&P (Signed)
.   Electronically Signed by Alen Blew P.A. on 04/27/2010 12:03:04 PM Electronically Signed by Charlton Haws MD [facility] on 04/28/2010 06:09:03 PM

## 2010-04-29 NOTE — Op Note (Signed)
  NAME:  OBE, AHLERS NO.:  [id number]  MEDICAL RECORD NO.:  [id number]          PATIENT TYPE:  INP  LOCATION:  2034                         FACILITY:  [facility]  PHYSICIAN:  Luis Abed, MD, FACCDATE OF BIRTH:  23-Mar-1932  DATE OF PROCEDURE: DATE OF DISCHARGE:                              OPERATIVE REPORT   Mr. Blann has left ventricular dysfunction.  He has been loaded with Tikosyn and he is anticoagulated and plans were to proceed with cardioversion.  This has been done today.  The patient received propofol 90 mg IV.  Anesthesia administered this. Anterior-posterior pads were in place with the biphasic defibrillator. The patient was cardioverted with 120 joules and he converted to sinus rhythm.  His ICD will be checked and interrogated to be sure the rhythm was sinus.  He is stable and waking up.  Successful cardioversion.     Luis Abed, MD, [facility]     JDK/MEDQ  D:  04/28/2010  T:  04/28/2010  Job:  [id number]  Electronically Signed by Willa Rough MD FACC on 04/29/2010 09:11:02 AM

## 2010-05-01 LAB — CBC
HCT: 38.6 % — ABNORMAL LOW (ref 39.0–52.0)
Hemoglobin: 13.1 g/dL (ref 13.0–17.0)
MCH: 28.4 pg (ref 26.0–34.0)
MCHC: 33.9 g/dL (ref 30.0–36.0)
MCV: 83.7 fL (ref 78.0–100.0)
Platelets: 95 10*3/uL — ABNORMAL LOW (ref 150–400)
RBC: 4.61 MIL/uL (ref 4.22–5.81)
RDW: 14.4 % (ref 11.5–15.5)
WBC: 4.8 10*3/uL (ref 4.0–10.5)

## 2010-05-01 LAB — BASIC METABOLIC PANEL
BUN: 16 mg/dL (ref 6–23)
BUN: 15 mg/dL (ref 6–23)
CO2: 26 mEq/L (ref 19–32)
CO2: 30 mEq/L (ref 19–32)
Calcium: 8.8 mg/dL (ref 8.4–10.5)
Calcium: 9 mg/dL (ref 8.4–10.5)
Chloride: 104 mEq/L (ref 96–112)
Chloride: 106 mEq/L (ref 96–112)
Creatinine, Ser: 1.02 mg/dL (ref 0.4–1.5)
Creatinine, Ser: 1.03 mg/dL (ref 0.4–1.5)
GFR calc Af Amer: >60 mL/min (ref >60)
GFR calc Af Amer: >60 mL/min (ref >60)
GFR calc non Af Amer: >60 mL/min (ref >60)
GFR calc non Af Amer: >60 mL/min (ref >60)
Glucose, Bld: 100 mg/dL — ABNORMAL HIGH (ref 70–99)
Glucose, Bld: 103 mg/dL — ABNORMAL HIGH (ref 70–99)
Potassium: 4.2 mEq/L (ref 3.5–5.1)
Potassium: 4.3 mEq/L (ref 3.5–5.1)
Sodium: 140 mEq/L (ref 135–145)
Sodium: 141 mEq/L (ref 135–145)

## 2010-05-01 LAB — PROTIME-INR
INR: 2.17 — ABNORMAL HIGH (ref 0.00–1.49)
INR: 2.21 — ABNORMAL HIGH (ref 0.00–1.49)
Prothrombin Time: 24.3 seconds — ABNORMAL HIGH (ref 11.6–15.2)
Prothrombin Time: 24.7 seconds — ABNORMAL HIGH (ref 11.6–15.2)

## 2010-05-02 LAB — BASIC METABOLIC PANEL
BUN: 19 mg/dL (ref 6–23)
CO2: 28 mEq/L (ref 19–32)
Calcium: 9.2 mg/dL (ref 8.4–10.5)
Chloride: 101 mEq/L (ref 96–112)
Creatinine, Ser: 1.06 mg/dL (ref 0.4–1.5)
GFR calc Af Amer: >60 mL/min (ref >60)
GFR calc non Af Amer: >60 mL/min (ref >60)
Glucose, Bld: 97 mg/dL (ref 70–99)
Potassium: 4.8 mEq/L (ref 3.5–5.1)
Sodium: 140 mEq/L (ref 135–145)

## 2010-05-02 LAB — PROTIME-INR
INR: 2.3 — ABNORMAL HIGH (ref 0.00–1.49)
Prothrombin Time: 25.4 seconds — ABNORMAL HIGH (ref 11.6–15.2)

## 2010-05-03 ENCOUNTER — Ambulatory Visit
Admission: RE | Admit: 2010-05-03 | Discharge: 2010-05-03 | Payer: Self-pay | Source: Home / Self Care | Attending: Internal Medicine | Admitting: Internal Medicine

## 2010-05-03 LAB — CONVERTED CEMR LAB: POC INR: 3

## 2010-05-08 ENCOUNTER — Encounter: Payer: Self-pay | Admitting: Internal Medicine

## 2010-05-08 ENCOUNTER — Ambulatory Visit
Admission: RE | Admit: 2010-05-08 | Discharge: 2010-05-08 | Payer: Self-pay | Source: Home / Self Care | Attending: Internal Medicine | Admitting: Internal Medicine

## 2010-05-08 NOTE — Discharge Summary (Signed)
NAME:  [name], [name] NO.:  [id number]  MEDICAL RECORD NO.:  [id number]          PATIENT TYPE:  INP  LOCATION:  2034                         FACILITY:  [facility]  PHYSICIAN:  Jesse Sans. Wall, MD, FACCDATE OF BIRTH:  02/19/1932  DATE OF ADMISSION:  04/26/2010 DATE OF DISCHARGE:  04/29/2010                              DISCHARGE SUMMARY   PRIMARY CARDIOLOGIST:  Doylene Canning. Ladona Ridgel, MD  PROCEDURES PERFORMED DURING HOSPITALIZATION:  [facility] cardioversion in the setting of atrial fibrillation per Dr. Willa Rough on April 28, 2010.  FINAL DISCHARGE DIAGNOSES: 1. Persistent atrial fibrillation.     a.     Status post direct current cardioversion successful on      April 28, 2010.     b.     Status post Tikosyn loading during hospitalization. 2. Chronic obstructive pulmonary disease. 3. Gastroesophageal reflux disease. 4. Porphyria. 5. Diverticulitis. 6. Benign prostatic hypertrophy. 7. Nonischemic cardiomyopathy.     a.     Congestive heart failure with class I-II symptoms with      ejection fraction of 20-30%. 8. Status post implantable cardioverter-defibrillator insertion dated     February 22, 2009.  HOSPITAL COURSE:  This is a 77 year old male with longstanding history of dilated cardiomyopathy, atrial flutter, and CHF who underwent electrophysiology study several months ago and had a pacemaker implantation in November 2010.  The patient remained in normal sinus rhythm but for several months has been having recurrence of atrial flutter.  The patient was also having New York Heart Association class I- II symptoms as well.  The patient was admitted for Tikosyn loading.  He also was seen by Dr. Myrtis Ser with a [facility] cardioversion.  The patient was followed throughout hospitalization by Dr. Ladona Ridgel and Dr. Valera Castle post cardioversion.  He was also seen by Dr. Willa Rough.  He tolerated all procedures well without incidents.  He was seen and examined by Dr. Valera Castle  on day of discharge with review of all of his EKGs for QT intervals.  The patient was loaded with Tikosyn 500 mg p.o. b.i.d. and will return home on same dose.  Most recent EKG completed 7:00 a.m. April 29, 2010, revealed a QT interval of 544 milliseconds with a QTc of 544 milliseconds.  DISCHARGE LABORATORY DATA:  PT 25.4 and INR 2.3.  Sodium 140, potassium 4.8, chloride 101, CO2 of 28, glucose 97, BUN 19, and creatinine 1.06. Hemoglobin 13.1, hematocrit 38.6, white blood cells 4.8, and platelets 95.  VITAL SIGNS ON DISCHARGE:  Blood pressure 99/52, pulse 60, respirations 18, temperature 97.3 with an O2 saturation of 96% on room air.  DISCHARGE MEDICATIONS: 1. Tikosyn 500 mg 1 p.o. b.i.d. 2. Astepro 0.15% solution to each nostril daily. 3. Carvedilol 6.25 mg b.i.d. 4. Coumadin 5 mg Tuesdays and Thursdays and 7.5 the rest of the week. 5. Docusate sodium 100 mg by mouth daily. 6. Equate Allergy Relief 1 tablet daily. 7. Enalapril 5 mg daily. 8. Furosemide oral solution 10 mg daily. 9. Mucous Relief 400 mg p.r.n. congestion. 10.Nasarel 29 mcg p.r.n. congestion. 11.Singulair 10 mg by mouth daily. 12.Valium 5 mg p.r.n. anxiety and sleep.  ALLERGIES:  PENICILLIN.  FOLLOWUP PLANS AND APPOINTMENT: 1. He will follow up with Dr. Lewayne Bunting in approximately 2 weeks     for reevaluation of atrial flutter with an EKG. 2. He will have prescription for Tikosyn as directed above. 3. He is advised to call our office for any problems associated with     medication and follow up bringing all medications to next     appointment. 4. He will follow with his primary care physician for continued     medical management.  Time spent with the patient to include physician time 40 minutes.     Bettey Mare. Lyman Bishop, NP   ______________________________ Jesse Sans Daleen Squibb, MD, [facility]    KML/MEDQ  D:  04/29/2010  T:  04/29/2010  Job:  [id number]  Electronically Signed by Joni Reining NP on  05/01/2010 04:44:45 PM Electronically Signed by Valera Castle MD FACC on 05/08/2010 10:59:50 AM

## 2010-05-09 NOTE — Medication Information (Signed)
Summary: CCR  Anticoagulant Therapy  Managed by: Cloyde Reams, RN, BSN Referring MD: Tonette Lederer, Fayrene Fearing PCP: Shaune Leeks MD Supervising MD: Mariah Milling Indication 1: Atrial Fibrillation [place] Site: [place] INR POC 1.6 INR RANGE 2.0-3.0  Dietary changes: yes       Details: Had turnips and salad last night.    Health status changes: no    Bleeding/hemorrhagic complications: no    Recent/future hospitalizations: no    Any changes in medication regimen? no    Recent/future dental: no  Any missed doses?: no       Is patient compliant with meds? yes       Allergies: 1)  ! Penicillin 2)  ! Terramycin 3)  ! Prilosec 4)  ! Biaxin 5)  ! Pepcid 6)  ! * Zegerid 7)  ! * Ranitidine  Anticoagulation Management History:      The patient is taking warfarin and comes in today for a routine follow up visit.  Positive risk factors for bleeding include an age of 75 years or older.  The bleeding index is 'intermediate risk'.  Positive CHADS2 values include History of CHF and Age > 69 years old.  His last INR was 2.25.  Anticoagulation responsible provider: [facility].  INR POC: 1.6.  Exp: 12/2010.    Anticoagulation Management Assessment/Plan:      The patient's current anticoagulation dose is Warfarin sodium 5 mg tabs: Use as directed by Anticoagulation Clinic.  The target INR is 2.0-3.0.  The next INR is due 11/16/2009.  Anticoagulation instructions were given to patient.  Results were reviewed/authorized by Cloyde Reams, RN, BSN.  He was notified by Cloyde Reams RN.         Prior Anticoagulation Instructions: INR 2.1  Continue on same dosage 1 tablet daily except 1.5 tablets on Mondays, Wednesdays, and Fridays.  Recheck in 4 weeks.    Current Anticoagulation Instructions: INR 1.6  Take 2 tablets today, then resume 1 tablet daily except 1.5 tablets on Mondays, Wednesdays, and Fridays.  Recheck in 2 weeks.

## 2010-05-09 NOTE — Medication Information (Signed)
Summary: CCR/AMD  Anticoagulant Therapy  Managed by: Charlena Cross, RN, BSN Referring MD: Tonette Lederer, Fayrene Fearing PCP: Shaune Leeks MD Supervising MD: Mariah Milling Indication 1: Atrial Fibrillation [place] Site: [place] INR POC 1.8 INR RANGE 2.0-3.0  Dietary changes: yes       Details: eating whatever he wants  Health status changes: no    Bleeding/hemorrhagic complications: no    Recent/future hospitalizations: no    Any changes in medication regimen? no    Recent/future dental: no  Any missed doses?: no       Is patient compliant with meds? yes       Allergies: 1)  ! Penicillin 2)  ! Terramycin 3)  ! Prilosec 4)  ! Biaxin 5)  ! Pepcid 6)  ! * Zegerid 7)  ! * Ranitidine  Anticoagulation Management History:      The patient is taking warfarin and comes in today for a routine follow up visit.  Positive risk factors for bleeding include an age of 75 years or older.  The bleeding index is 'intermediate risk'.  Positive CHADS2 values include History of CHF and Age > 75 years old.  His last INR was 2.25.  Anticoagulation responsible provider: [facility].  INR POC: 1.8.    Anticoagulation Management Assessment/Plan:      The patient's current anticoagulation dose is Warfarin sodium 5 mg tabs: Use as directed by Anticoagulation Clinic.  The target INR is 2.0-3.0.  The next INR is due 08/01/2009.  Anticoagulation instructions were given to patient.  Results were reviewed/authorized by Charlena Cross, RN, BSN.  He was notified by Charlena Cross, RN, BSN.         Prior Anticoagulation Instructions: The patient is to continue with the same dose of coumadin.  This dosage includes: coumadin 5 mg daily  Current Anticoagulation Instructions: coumadin 7.5 mg today then resume coumadin 5 mg daily

## 2010-05-09 NOTE — Medication Information (Signed)
Summary: ccr  Anticoagulant Therapy  Managed by: Charlena Cross, RN, BSN Referring MD: Tonette Lederer, Shann Medal MD: Ladona Ridgel MD, Sharlot Gowda Indication 1: Atrial Fibrillation [place] Site: [place] INR POC 2.1  Dietary changes: no    Health status changes: no    Bleeding/hemorrhagic complications: no    Recent/future hospitalizations: no    Any changes in medication regimen? no    Recent/future dental: no  Any missed doses?: no       Is patient compliant with meds? yes       Current Medications (verified): 1)  Enalapril Maleate 5 Mg  Tabs (Enalapril Maleate) .Marland Kitchen.. 1 Every Day 2)  Furosemide 20 Mg  Tabs (Furosemide) .... 1/2 Tablet Every Day 3)  Carvedilol 6.25 Mg  Tabs (Carvedilol) .Marland Kitchen.. 1 Tab Twice A Day 4)  Mucus Relief 400 Mg  Tabs (Guaifenesin) .Marland Kitchen.. 1 Tab Every Day 5)  Nasarel 29 Mcg/act  Soln (Flunisolide) .Marland Kitchen.. 1 Spray Every Day 6)  Singulair 10 Mg  Tabs (Montelukast Sodium) .Marland Kitchen.. 1 Tab Every Day As Needed 7)  Equate Allergy Relief 10mg  .... 1 Tab Every Day 8)  Valium 5 Mg  Tabs (Diazepam) .... 1/4 Tab At Bedtime As Needed 9)  Carafate 1 Gm Tabs (Sucralfate) .... 1/2 Tablet By Mouth As Needed 10)  Advair Diskus 100-50 Mcg/dose Aepb (Fluticasone-Salmeterol) .Marland Kitchen.. 1 Inhalation Twice A Day By Mouth 11)  Astepro 0.15 % Soln (Azelastine Hcl) .Marland Kitchen.. 1 Spray Each Nostril As Needed 12)  Warfarin Sodium 5 Mg Tabs (Warfarin Sodium) .... Use As Directed By Anticoagulation Clinic  Allergies (verified): 1)  ! Penicillin 2)  ! Terramycin 3)  ! Prilosec 4)  ! Biaxin 5)  ! Pepcid 6)  ! * Zegerid 7)  ! * Ranitidine  Anticoagulation Management History:      The patient is taking warfarin and comes in today for a routine follow up visit.  Positive risk factors for bleeding include an age of 75 years or older.  The bleeding index is 'intermediate risk'.  Positive CHADS2 values include History of CHF and Age > 79 years old.  Anticoagulation responsible Giavonna Pflum: Ladona Ridgel MD, Sharlot Gowda.   INR POC: 2.1.    Anticoagulation Management Assessment/Plan:      The patient's current anticoagulation dose is Warfarin sodium 5 mg tabs: Use as directed by Anticoagulation Clinic.  The target INR is 2.0-3.0.  The next INR is due 01/17/2009.  Anticoagulation instructions were given to patient.  Results were reviewed/authorized by Charlena Cross, RN, BSN.  He was notified by [place].         Prior Anticoagulation Instructions: The patient is to continue with the same dose of coumadin.  This dosage includes: coumadin 5mg  daily  Current Anticoagulation Instructions: Sun, Mon, Wed, Fri 2.5mg   tablet Tue, Thurs, Sat 5mg   tablet

## 2010-05-09 NOTE — Assessment & Plan Note (Signed)
Summary: eph/ gd   Visit Type:  Follow-up Primary Provider:  Eustaquio Boyden  MD  CC:  Atrial Fibrillation.  History of Present Illness: The patient presents for followup. I saw him a few days ago and admitted him to [facility] for a GI bleed. He did not need any transfusions were studies. Bleeding was self-limited. His Coumadin was held briefly. It was felt to be internal hemorrhoids or diverticular. He is back on his Coumadin now. He's had no problems since that admission. The chest discomfort that he was describing that they is not bothering him. He's not had any shortness of breath, PND or orthopnea. Is not having any palpitations, presyncope or syncope. He denies any weight gain or edema. Is in atrial fibrillation having failed flutter ablation earlier this year and having had a cardioversion. He does not notice this rhythm and was surprised to learn that he is in fibrillation.  Current Medications (verified): 1)  Enalapril Maleate 5 Mg  Tabs (Enalapril Maleate) .Marland Kitchen.. 1 By Mouth Two Times A Day 2)  Furosemide 20 Mg  Tabs (Furosemide) .... 1/2 Tablet Every Day 3)  Carvedilol 6.25 Mg  Tabs (Carvedilol) .Marland Kitchen.. 1 Tab Twice A Day 4)  Valium 5 Mg  Tabs (Diazepam) .... 1/4 Tab At Bedtime As Needed 5)  Carafate 1 Gm Tabs (Sucralfate) .... 1/2 Tablet By Mouth As Needed 6)  Warfarin Sodium 5 Mg Tabs (Warfarin Sodium) .... Use As Directed By Anticoagulation Clinic 7)  Nasarel 29 Mcg/act  Soln (Flunisolide) .Marland Kitchen.. 1 Spray Every Day 8)  Singulair 10 Mg  Tabs (Montelukast Sodium) .Marland Kitchen.. 1 Tab Every Day As Needed 9)  Astepro 0.15 % Soln (Azelastine Hcl) .Marland Kitchen.. 1 Spray Each Nostril As Needed 10)  Advair Diskus 100-50 Mcg/dose Aepb (Fluticasone-Salmeterol) .Marland Kitchen.. 1 Inhalation Twice A Day By Mouth 11)  Mucus Relief 400 Mg  Tabs (Guaifenesin) .Marland Kitchen.. 1 Tab Every Day As Needed 12)  Equate Allergy Relief 10mg  .... 1 Tab Every Day (Claritin Generic) 13)  Docusate Sodium 100 Mg Caps (Docusate Sodium) .... Take One  Pill Daily As Needed Constipation, Hold For Diarrhea  Allergies (verified): 1)  ! Penicillin 2)  ! Terramycin 3)  ! Prilosec 4)  ! Biaxin 5)  ! Pepcid 6)  ! * Zegerid 7)  ! * Ranitidine  Past History:  Past Medical History: Reviewed history from 01/11/2010 and no changes required. LBBB (ICD-426.3) ICD - IN SITU (ICD-V45.02) - EF 35% - (06/2004) CARDIOMYOPATHY, SECONDARY (ICD-425.9) - (06/2004) HYPERLIPIDEMIA (ICD-272.4) - (05/2000) COPD  VIA CXR (ICD-496) - :(07/2003) GERD (ICD-530.81) - via egd withh.h., acute gastritis, duodenitis:(12/2004) GAS/BLOATING (ICD-787.3) GOITER (ICD-240.9) PORPHYRIA (ICD-277.1) IBS (ICD-564.1) BENIGN PROSTATIC HYPERTROPHY (ICD-600.00) - (1995) DIVERTICULITIS, HX OF (ICD-V12.79) - , hx of:(01/1998) ALLERGIC RHINITIS (ICD-477.9) - and asthma per Dr. [place] Callas PAIN IN JOINT, PELVIS/THIGH (RIGHT BUTTOCK) (ICD-719.45) HEEL PAIN, RIGHT (ICD-729.5) SPECIAL SCREENING MALIG NEOPLASMS OTHER SITES (ICD-V76.49) Admission to [facility] with GI bleed 12/2009  Past Surgical History: Reviewed history from 02/23/2009 and no changes required. PENILE PROSTHESIS CHOLEYCYSTECTOMY GOITER REMOVAL CATH CLEART MID 1990s NASAL SURGERY(:1971) COLONOSCOPY;  DIVERTIC,SPLENIC, HEPATIC FLEURE ONLY:(01/1998) EGD STRICTURE,SLIDING H/H. ; GERD:(01/1998) EGD, DILITATION:(02/18/1998) EGD STRICTURE ; GASTRITIS, H.H. , GERD NO DILITATION TODAY DR. PERRY:(04/08/2001) HERNIA REPAIR:(DR. MARTIN):(01/30/2002) PLUMONARY EVALUATION ([facility]) CHRONIC CONGESTIVE  SYMPTOMS:(04/2002) CATHERIZATION, SEVERE NONISCHEMIA CARDIOMYOPATHY, EF 25 TO 30 %:(06/22/2004) ECHO EF25-30% ,MOD LVH; LA SEVERE DILATATION; MILD A.R.; IRTR:(07/13/2004) EGD, STRICTURE; GASTRITIS, DUODENITIS, GERD:(12/22/2004) BIVENTRICULAR ICD IMPLANTATION; 2ND TO CHF AND NON-ISCHEMICA CARDIOMYOPATHY:(12/02/2006) INFLATABLE PENILE PROTHESIS (DR. OTELIN):05/2005) ECHO HYPOKINESIS  POSTERIOR WALL, EF 35%, MILD AR:(11/27/2006) EGD  REFLUX  ERYTHEM. DUOD. (DR ELLIOTT) (10/21/2008) COLONOSCOPY   ABORTED   DIVERTICS INT HEMMS (DR ELLIOTT) (10/21/2008) [facility] AFLUTTER CARDIOVERTED 02/22/2009  Review of Systems       As stated in the HPI and negative for all other systems.   Vital Signs:  Patient profile:   77 year old male Height:      67 inches Weight:      146 pounds BMI:     22.95 Pulse rate:   68 / minute Resp:     16 per minute BP sitting:   138 / 79  (right arm)  Vitals Entered By: Marrion Coy, CNA (January 18, 2010 10:04 AM)  Physical Exam  General:  Well-developed,well-nourished,in no acute distress; alert,appropriate and cooperative throughout examination. Head:  normocephalic and atraumatic Eyes:  PERRLA/EOM intact; conjunctiva and lids normal. Mouth:  Upper dentures, gums and palate normal. Oral mucosa normal. Neck:  Neck supple, no JVD. No masses, thyromegaly or abnormal cervical nodes. Chest Wall:  Well healed ICD pocket Lungs:  Normal respiratory effort, chest expands symmetrically. Lungs are clear to auscultation, no crackles or wheezes. Abdomen:  Bowel sounds positive; abdomen soft and non-tender without masses, organomegaly, or hernias noted. No hepatosplenomegaly. Msk:  Back normal, normal gait. Muscle strength and tone normal. Extremities:  No clubbing or cyanosis. Neurologic:  Alert and oriented x 3. Skin:  Intact without lesions or rashes. Cervical Nodes:  no significant adenopathy Axillary Nodes:  no significant adenopathy Inguinal Nodes:  no significant adenopathy Psych:  Normal affect.   Detailed Cardiovascular Exam  Neck    Carotids: Carotids full and equal bilaterally without bruits.      Neck Veins: Normal, no JVD.    Heart    Inspection: no deformities or lifts noted.      Palpation: normal PMI with no thrills palpable.      Auscultation: irregular rate and rhythm, S1, S2 without murmurs, rubs, gallops, or clicks.    Vascular    Abdominal Aorta: no palpable masses,  pulsations, or audible bruits.      Femoral Pulses: normal femoral pulses bilaterally.      Pedal Pulses: 2+ periph pulses    Radial Pulses: normal radial pulses bilaterally.      Peripheral Circulation: no clubbing, cyanosis, or edema noted with normal capillary refill.     EKG  Procedure date:  01/18/2010  Findings:      past atrial fibrillation with ventricular pacing 100% capture   ICD Specifications Following MD:  Lewayne Bunting, MD     ICD Vendor:  [employer]     ICD Model Number:  7299     ICD Serial Number:  [id number] ICD DOI:  08/31/2005     ICD Implanting MD:  Lewayne Bunting, MD  Lead 1:    Location: RA     DOI: 08/31/2005     Model #: [id number]     Serial #: [id number]     Status: active Lead 2:    Location: RV     DOI: 08/31/2005     Model #: [id number]     Serial #: [id number]     Status: active Lead 3:    Location: LV     DOI: 08/31/2005     Model #: [id number]     Serial #: [id number]     Status: active  Indications::  NICM, CHF   ICD Follow Up ICD Dependent:  Yes  Episodes Coumadin:  Yes  Brady Parameters Mode DDD     Lower Rate Limit:  60     Upper Rate Limit 130 PAV 130     Sensed AV Delay:  100  Tachy Zones VF:  200     VT:  240     VT1:  176     Impression & Recommendations:  Problem # 1:  ATRIAL FLUTTER (ICD-427.32) The patient has bladder/. He is not noticing this dysrhythmia. Pacing 100% of the time. He tolerates Coumadin. At this point I would not pursue further cardioversion. However, the patient will be seeing Dr. Ladona Ridgel to discuss further.  Problem # 2:  CHEST PAIN (ICD-786.50) He is no longer having chest pain. Further cardiovascular testing is suggested. Orders: EKG w/ Interpretation (93000)  Problem # 3:  CONGESTIVE HEART FAILURE (ICD-428.0) He seems to be euvolemic and will continue the meds as listed.  Patient Instructions: 1)  Your physician recommends that you schedule a follow-up appointment in: 6 months 2)  Your physician recommends that you  continue on your current medications as directed. Please refer to the Current Medication list given to you today.

## 2010-05-09 NOTE — Miscellaneous (Signed)
Summary: meds update  Clinical Lists Changes  Medications: Removed medication of WARFARIN SODIUM 5 MG TABS (WARFARIN SODIUM) Use as directed by Anticoagulation Clinic Added new medication of * PRADAXA 150 MG 1 cap by mouth twice daily

## 2010-05-09 NOTE — Cardiovascular Report (Signed)
Summary: Office Visit    Office Visit    Imported By: Roderic Ovens 01/13/2010 16:49:41  _____________________________________________________________________  External Attachment:    Type:   Image     Comment:   External Document

## 2010-05-09 NOTE — Progress Notes (Signed)
Summary: [facility]/Allergy  [facility]/Allergy   Imported By: Eleonore Chiquito 01/28/2007 09:34:38  _____________________________________________________________________  External Attachment:    Type:   Image     Comment:   External Document

## 2010-05-09 NOTE — Assessment & Plan Note (Signed)
Summary: COUGH/DLO   Vital Signs:  Patient profile:   77 year old male Height:      65 inches Weight:      148 pounds BMI:     24.72 Temp:     97.8 degrees F oral Pulse rate:   68 / minute Pulse rhythm:   regular BP sitting:   134 / 64  (left arm) Cuff size:   regular  Vitals Entered By: Providence Crosby (Aug 23, 2008 11:33 AM) CC: cough with gagging// chest congestion// and stomach issues   History of Present Illness: Pt is here for congestion, is blowing out clear but is coughing up yellow sputum and gags with coughing. He wheezes with coughing, and has no fever. He had 2 handkerchiefs in bed with hoim last night, full of mucous by the end of the night. He has no ear pain, no real headache.  He also has RUQ abd pain, started before the coughing. Is located near his prior choleycystectomy scar. It hurts to push in a very specif spot in his RUQ. 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   Allergies: 1)  ! Penicillin 2)  ! Terramycin 3)  ! Prilosec 4)  ! Biaxin 5)  ! Pepcid  Physical Exam  General:  Well-developed,well-nourished,in no acute distress; alert,appropriate and cooperative throughout examination, congested. Head:  Normocephalic and atraumatic without obvious abnormalities. No apparent alopecia or balding. Sinuses nontender. Eyes:  Conjunctiva clear bilaterally.  Ears:  External ear exam shows no significant lesions or deformities.  Otoscopic examination reveals clear canals, tympanic membranes are intact bilaterally without bulging, retraction, inflammation or discharge. Hearing is grossly normal bilaterally. Nose:  External nasal examination shows no deformity or inflammation. Nasal mucosa are pink and moist without lesions or exudates. Mouth:  Oral mucosa and oropharynx without lesions or exudates.  Teeth in good repair. Minimal erythema in pharynx. Neck:  No deformities, masses, or  tenderness noted. Chest Wall:  No deformities, masses, tenderness or gynecomastia noted. Lungs:  Normal respiratory effort, chest expands symmetrically. Lungs are clear to auscultation, no crackles or wheezes. Minimal ronchi centrally. Heart:  Normal rate and regular rhythm. S1 and S2 normal without gallop, murmur, click, rub or other extra sounds. Abdomen:  Bowel sounds positive,abdomen soft and minimally tender without masses, organomegaly or hernias noted. Tenderness with direct palpation just lateral to middle of prior classic choleycystectomy scar.    Impression & Recommendations:  Problem # 1:  BRONCHITIS- ACUTE (ICD-466.0) Assessment New  Probably viral but with his history, will cover fopr bacterial. See instructions. The following medications were removed from the medication list:    Zithromax Z-pak 250 Mg Tabs (Azithromycin) .Marland Kitchen... As dir His updated medication list for this problem includes:    Mucus Relief 400 Mg Tabs (Guaifenesin) .Marland Kitchen... 1 tab every day    Singulair 10 Mg Tabs (Montelukast sodium) .Marland Kitchen... 1 tab every day as needed    Zithromax Z-pak 250 Mg Tabs (Azithromycin) .Marland Kitchen... As dir  Take antibiotics and other medications as directed. Encouraged to push clear liquids, get enough rest, and take acetaminophen as needed. To be seen in 5-7 days if no improvement, sooner if worse.  Problem # 2:  GAS/BLOATING (ICD-787.3) Assessment: New Trial of Somethicone. Do not think this divertics inflamed. Hold Zegerid for a few days and assess. May need to return. Continue activity as able. Drink lots of fluids.  Problem # 3:  DIVERTICULITIS, HX OF (ICD-V12.79) Assessment: Unchanged Seems stable.  Complete Medication List: 1)  Enalapril Maleate 5 Mg Tabs (Enalapril maleate) .Marland Kitchen.. 1 every day 2)  Furosemide 20 Mg Tabs (Furosemide) .... 1/2 tablet every day 3)  Carvedilol 6.25 Mg Tabs (Carvedilol) .Marland Kitchen.. 1 tab twice a day 4)  Mucus Relief 400 Mg Tabs (Guaifenesin) .Marland Kitchen.. 1 tab every  day 5)  Astelin 137 Mcg/spray Soln (Azelastine hcl) .Marland Kitchen.. 1 spray each nostril qd 6)  Nasarel 29 Mcg/act Soln (Flunisolide) .Marland Kitchen.. 1 spray every day 7)  Singulair 10 Mg Tabs (Montelukast sodium) .Marland Kitchen.. 1 tab every day as needed 8)  Equate Allergy Relief 10mg   .... 1 tab every day 9)  Zegerid 40-1100 Mg Caps (Omeprazole-sodium bicarbonate) .Marland Kitchen.. 1 tab  every other day not taken for 2 days 10)  Valium 5 Mg Tabs (Diazepam) .... 1/4 tab at bedtime as needed 11)  Zithromax Z-pak 250 Mg Tabs (Azithromycin) .... As dir  Patient Instructions: 1)  Try Mylanta/Simethicone per label every 4 hrs. 2)  Take Zithromax as directed. 3)  Take Guaifenesin by [facility] or [facility] and getting MUCOUS RELIEF EXPECTORANT (400mg ), take 11/2 tabs by mouth AM and NOON. 4)  Drink lots of fluids anytime taking Guaifenesin.  Prescriptions: ZITHROMAX Z-PAK 250 MG TABS (AZITHROMYCIN) as dir  #1 pak x 0   Entered and Authorized by:   Shaune Leeks MD   Signed by:   Shaune Leeks MD on 08/23/2008   Method used:   Electronically to        [id number] (retail)       [address]       [place], [place]  [address]       Ph: [phone] or [phone]       Fax: [phone]   RxID:   [id number]

## 2010-05-09 NOTE — Medication Information (Signed)
Summary: CCR/AMD  Anticoagulant Therapy  Managed by: Cloyde Reams, RN, BSN Referring MD: Tonette Lederer, Fayrene Fearing PCP: Eustaquio Boyden  MD Supervising MD: Mariah Milling Indication 1: Atrial Fibrillation Lab Used:  [facility] [place] Site: [place] INR POC 2.4 INR RANGE 2.0-3.0  Dietary changes: no    Health status changes: no    Bleeding/hemorrhagic complications: yes       Details: Rectal bleeding off coumadin x several weeks.  Resumed Coumadin 01/18/10.   Recent/future hospitalizations: no    Any changes in medication regimen? yes       Details: Added stool softner to medications.   Recent/future dental: no  Any missed doses?: no       Is patient compliant with meds? yes      Comments: Denies any bleeding since Coumadin restart.   Allergies: 1)  ! Penicillin 2)  ! Terramycin 3)  ! Prilosec 4)  ! Biaxin 5)  ! Pepcid 6)  ! * Zegerid 7)  ! * Ranitidine  Anticoagulation Management History:      The patient is taking warfarin and comes in today for a routine follow up visit.  Positive risk factors for bleeding include an age of 75 years or older.  The bleeding index is 'intermediate risk'.  Positive CHADS2 values include History of CHF and Age > 5 years old.  His last INR was 2.25.  Anticoagulation responsible Melvyn Hommes: gollan.  INR POC: 2.4.  Cuvette Lot#: [id number].  Exp: 02/2011.    Anticoagulation Management Assessment/Plan:      The patient's current anticoagulation dose is Warfarin sodium 5 mg tabs: Use as directed by Anticoagulation Clinic.  The target INR is 2.0-3.0.  The next INR is due 03/01/2010.  Anticoagulation instructions were given to patient.  Results were reviewed/authorized by Cloyde Reams, RN, BSN.  He was notified by Cloyde Reams RN.         Prior Anticoagulation Instructions: INR 2.0  Stop Coumadin.  Pt to call if Coumadin restarted.   Current Anticoagulation Instructions: INR 2.4  Continue on same dosage 1 tablet daily except 1.5  tablets on Mondays, Wednesdays, and Fridays.  Recheck in 4 weeks.

## 2010-05-09 NOTE — Assessment & Plan Note (Signed)
Summary: CPX/RBH   Vital Signs:  Patient profile:   77 year old male Height:      65 inches Weight:      144 pounds BMI:     24.05 Temp:     98.1 degrees F oral Pulse rate:   92 / minute Pulse rhythm:   regular Resp:     20 per minute BP sitting:   100 / 60  (left arm) Cuff size:   regular  Vitals Entered By: Providence Crosby LPN (December 08, 2008 9:22 AM) CC: check up// has been seeing DrEliott for [employer]   .  History of Present Illness: Pt here for Comp Exam, doing ok today. Has had multiple GI exams with EGD and Colonoscopy and then Barium Enema. He is unable to take PPi of any kind and is on Carafate regularly, 1/2 tabs dissolved with most meals. He still gets irritation of the stomach with saucy foods. He is taking water from [place], 1 gallon a day and is not sure it is helping...had a rash and was told it is flushing his kidney. He overall is doing much better. He is able to smile today.   Preventive Screening-Counseling & Management  Alcohol-Tobacco     Alcohol drinks/day: 0     Smoking Status: never     Passive Smoke Exposure: no  Caffeine-Diet-Exercise     Caffeine use/day: 1     Does Patient Exercise: no     Type of exercise: Farms      Times/week: 6  Problems Prior to Update: 1)  Abdominal Pain, Epigastric  (ICD-789.06) 2)  Lbbb  (ICD-426.3) 3)  Icd - in Situ  (ICD-V45.02) 4)  Congestive Heart Failure  (ICD-428.0) 5)  Cardiomyopathy, Secondary  (ICD-425.9) 6)  Hyperlipidemia  (ICD-272.4) 7)  COPD Via Cxr  (ICD-496) 8)  Gerd  (ICD-530.81) 9)  Gas/bloating  (ICD-787.3) 10)  Goiter  (ICD-240.9) 11)  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 1 Every Day 2)  Furosemide 20 Mg  Tabs (Furosemide) .... 1/2 Tablet Every Day 3)  Carvedilol 6.25 Mg  Tabs (Carvedilol) .Marland Kitchen.. 1 Tab Twice A Day 4)  Mucus Relief 400 Mg  Tabs (Guaifenesin) .Marland Kitchen.. 1 Tab Every Day 5)  Astelin 137 Mcg/spray  Soln (Azelastine Hcl) .Marland Kitchen.. 1 Spray Each Nostril Qd 6)  Nasarel 29 Mcg/act  Soln (Flunisolide) .Marland Kitchen.. 1 Spray Every Day 7)  Singulair 10 Mg  Tabs (Montelukast Sodium) .Marland Kitchen.. 1 Tab Every Day As Needed 8)  Equate Allergy Relief 10mg  .... 1 Tab Every Day 9)  Valium 5 Mg  Tabs (Diazepam) .... 1/4 Tab At Bedtime As Needed  Allergies: 1)  ! Penicillin 2)  ! Terramycin 3)  ! Prilosec 4)  ! Biaxin 5)  ! Pepcid 6)  ! * Zegerid 7)  ! * Ranitidine  Past History:  Past Medical History: Last updated: 09/13/2008 LBBB (ICD-426.3) ICD - IN SITU (ICD-V45.02) - EF 35% - (06/2004) CARDIOMYOPATHY, SECONDARY (ICD-425.9) - (06/2004) HYPERLIPIDEMIA (ICD-272.4) - (05/2000) COPD  VIA CXR (ICD-496) - :(07/2003) GERD (ICD-530.81) - via egd withh.h., acute gastritis, duodenitis:(12/2004) GAS/BLOATING (ICD-787.3) GOITER (ICD-240.9) PORPHYRIA (  ICD-277.1) IBS (ICD-564.1) BENIGN PROSTATIC HYPERTROPHY (ICD-600.00) - (1995) DIVERTICULITIS, HX OF (ICD-V12.79) - , hx of:(01/1998) ALLERGIC RHINITIS (ICD-477.9) - 2002 PAIN IN JOINT, PELVIS/THIGH (RIGHT BUTTOCK) (ICD-719.45) HEEL PAIN, RIGHT (ICD-729.5) SPECIAL SCREENING MALIG NEOPLASMS OTHER SITES (ICD-V76.49)  Past Surgical History: Last updated: 10/27/2008 PENILE PROSTHESIS CHOLEYCYSTECTOMY GOITER REMOVAL CATH CLEART MID 1990s NASAL SURGERY(:1971) COLONOSCOPY;  DIVERTIC,SPLENIC, HEPATIC FLEURE ONLY:(01/1998) EGD STRICTURE,SLIDING H/H. ; GERD:(01/1998) EGD, DILITATION:(02/18/1998) EGD STRICTURE ; GASTRITIS, H.H. , GERD NO DILITATION TODAY DR. PERRY:(04/08/2001) HERNIA REPAIR:(DR. MARTIN):(01/30/2002) PLUMONARY EVALUATION ([facility]) CHRONIC CONGESTIVE   SYMPTOMS:(04/2002) CATHERIZATION, SEVERE NONISCHEMIA CARDIOMYOPATHY, EF 25 TO 30 %:(06/22/2004) ECHO EF25-30% ,MOD LVH; LA SEVERE DILATATION; MILD A.R.; IRTR:(07/13/2004) EGD, STRICTURE; GASTRITIS, DUODENITIS, GERD:(12/22/2004) BIVENTRICULAR ICD IMPLANTATION; 2ND TO CHF AND NON-ISCHEMICA CARDIOMYOPATHY:(12/02/2006) INFLATABLE PENILE PROTHESIS (DR. OTELIN):05/2005) ECHO HYPOKINESIS POSTERIOR WALL, EF 35%, MILD AR:(11/27/2006) EGD REFLUX  ERYTHEM. DUOD. (DR ELLIOTT) (10/21/2008) COLONOSCOPY   ABORTED   DIVERTICS INT HEMMS (DR ELLIOTT) (10/21/2008)  Family History: Last updated: 2008-12-26 Father: DECEASED 46 YOA, CAD, HTN Mother: DECEASED 39 YOA CATH AT 41 YOA NORMAL NO BLOCKAGE, CHF SISTER A 76  DYSPHAGIA STOMACH PROBS SISTER A 63 STOMACH PROBS SISTER A 62 STOMACH PROBS CV: + FATHER, + UNCLES HBP: + FATHER DM: NEGATIVE GOUT/ARTHRITIS: PROSTATE/CANCER: BREAST/OVARIAN/UTERINE CANCER:+ COUISINS, +AUNT COLON/CANCER: DEPRESSION: NEGATIVE ETOH ABUSE: +M UNCLES OTHER : STROKE, NEGATIVE  Social History: Last updated: 09/13/2008 Marital Status: MarriedLIVES WITH WIFE Children: 3 CHILDREN// 8 GRANDCHILDREN 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  Alcohol Use - no Regular Exercise - no Drug Use - no  Risk Factors: Alcohol Use: 0 (2008/12/26) Caffeine Use: 1 (12/26/2008) Exercise: no (2008-12-26)  Risk Factors: Smoking Status: never (2008-12-26) Passive Smoke Exposure: no (26-Dec-2008)  Family History: Father: DECEASED 28 YOA, CAD, HTN Mother: DECEASED 24 YOA CATH AT 29 YOA NORMAL NO BLOCKAGE, CHF SISTER A 76  DYSPHAGIA STOMACH PROBS SISTER A 63 STOMACH PROBS SISTER A 62 STOMACH PROBS CV: + FATHER, + UNCLES HBP: + FATHER DM: NEGATIVE GOUT/ARTHRITIS: PROSTATE/CANCER: BREAST/OVARIAN/UTERINE CANCER:+ COUISINS, +AUNT COLON/CANCER: DEPRESSION: NEGATIVE ETOH ABUSE: +M UNCLES OTHER : STROKE, NEGATIVE  Review of Systems General:  Denies chills,  fatigue, fever, loss of appetite, malaise, sleep disorder, sweats, weakness, and weight loss. Eyes:  Denies blurring, discharge, double vision, eye irritation, eye pain, halos, itching, light sensitivity, red eye, vision loss-1 eye, and vision loss-both eyes. ENT:  Denies decreased hearing, difficulty swallowing, ear discharge, earache, hoarseness, nasal congestion, nosebleeds, postnasal drainage, ringing in ears, sinus pressure, and sore throat. CV:  Denies bluish discoloration of lips or nails, chest pain or discomfort, difficulty breathing at night, difficulty breathing while lying down, fainting, fatigue, leg cramps with exertion, lightheadness, near fainting, palpitations, shortness of breath with exertion, swelling of feet, swelling of hands, and weight gain. Resp:  Denies chest discomfort, chest pain with inspiration, cough, coughing up blood, excessive snoring, hypersomnolence, morning headaches, pleuritic, shortness of breath, sputum productive, and wheezing. GI:  Complains of constipation and indigestion; denies abdominal pain, bloody stools, change in bowel habits, dark tarry stools, diarrhea, excessive appetite, gas, hemorrhoids, loss of appetite, nausea, vomiting, vomiting blood, and yellowish skin color; RARELY, HEARTURN IMPROVED WITH CARAFATE. GU:  Complains of nocturia; denies decreased libido, discharge, dysuria, erectile dysfunction, genital sores, hematuria, incontinence, urinary frequency, and urinary hesitancy; MULTIPLE. MS:  Denies joint pain, joint redness, joint swelling, loss of strength, low back pain, mid back pain, muscle aches, muscle , cramps, muscle weakness, stiffness, and thoracic pain. Derm:  Denies changes in color of skin,  changes in nail beds, dryness, excessive perspiration, flushing, hair loss, insect bite(s), itching, lesion(s), poor wound healing, and rash. Neuro:  Denies brief paralysis, difficulty with concentration, disturbances in coordination, falling down,  headaches, inability to speak, memory loss, numbness, poor balance, seizures, sensation of room spinning, tingling, tremors, visual disturbances, and weakness.  Physical Exam  General:  Well-developed,well-nourished,in no acute distress; alert,appropriate and cooperative throughout examination. Head:  Normocephalic and atraumatic without obvious abnormalities. No apparent alopecia or balding. Sinuses nontender. Eyes:  Conjunctiva clear bilaterally.  Ears:  External ear exam shows no significant lesions or deformities.  Otoscopic examination reveals clear canals, tympanic membranes are intact bilaterally without bulging, retraction, inflammation or discharge. Hearing is grossly normal bilaterally. Nose:  External nasal examination shows no deformity or inflammation. Nasal mucosa are pink and moist without lesions or exudates. Mouth:  Oral mucosa and oropharynx without lesions or exudates.  Teeth in good repair. Neck:  No deformities, masses, or tenderness noted. Chest Wall:  No deformities, masses, tenderness or gynecomastia noted. Breasts:  No masses or gynecomastia noted Lungs:  Normal respiratory effort, chest expands symmetrically. Lungs are clear to auscultation, no crackles or wheezes. Heart:  Normal rate and regular rhythm. S1 and S2 normal without gallop, murmur, click, rub or other extra sounds. Abdomen:  Bowel sounds positive,abdomen soft and non-tender without masses, organomegaly or hernias noted. Rectal:  No external abnormalities noted. Normal sphincter tone. No rectal masses or tenderness. G neg. Genitalia:  Testes bilaterally descended without nodularity, tenderness or masses. No scrotal masses or lesions. No penis lesions or urethral discharge. Inflatable erection device in place. Prostate:  Prostate gland firm and smooth, no enlargement, nodularity, tenderness, mass, asymmetry or induration. 10gms. Msk:  No deformity or scoliosis noted of thoracic or lumbar spine.   Pulses:  R  and L carotid,radial,femoral,dorsalis pedis and posterior tibial pulses are full and equal bilaterally Extremities:  No clubbing, cyanosis, edema, or deformity noted with normal full range of motion of all joints.   Neurologic:  No cranial nerve deficits noted. Station and gait are normal. Plantar reflexes are down-going bilaterally. DTRs are symmetrical throughout. Sensory, motor and coordinative functions appear intact. Skin:  Intact without suspicious lesions or rashes wexcept mildy erythematous confluent splotchy rash of mid chest andf upper abd felt to be from "the water" he is drinking. Cervical Nodes:  No lymphadenopathy noted Inguinal Nodes:  No significant adenopathy Psych:  Cognition and judgment appear intact. Alert and cooperative with normal attention span and concentration. No apparent delusions, illusions, hallucinations   Impression & Recommendations:  Problem # 1:  ABDOMINAL PAIN, EPIGASTRIC (ICD-789.06) Assessment Improved  Seems well comntolled, cont Carafate.  Problem # 2:  CONGESTIVE HEART FAILURE (ICD-428.0) Assessment: Unchanged  Heart rate elevated but controlled and nml, no signs of edema. His updated medication list for this problem includes:    Enalapril Maleate 5 Mg Tabs (Enalapril maleate) .Marland Kitchen... 1 every day    Furosemide 20 Mg Tabs (Furosemide) .Marland Kitchen... 1/2 tablet every day    Carvedilol 6.25 Mg Tabs (Carvedilol) .Marland Kitchen... 1 tab twice a day  Echocardiogram: -  There is signifcant hypokinesis of the posterior wall. The EF         seems to have improved since report of prior study. The EF         now is at least 35%. The left ventricle was mildly dilated.         Overall left ventricular systolic function was moderately  decreased. Left ventricular ejection fraction was estimated         to be 35 %.   -  There was mild aortic valvular regurgitation. The mean         transaortic valve gradient was 3.4 mmHg. Estimated aortic         valve area (by Vmax) was  3.34 cm^2.   -  Mitral valve area by pressure half-time was 3.03 cm^2.   -  The left atrium was mildly dilated.   -  There was the appearance of a catheter or pacing wire in the         right ventricle. (11/27/2006)  Problem # 3:  HYPERLIPIDEMIA (ICD-272.4) Assessment: Improved Reasonable nos. Cont. Labs Reviewed: SGOT: 17 (12/03/2008)   SGPT: 16 (12/03/2008)   HDL:43.10 (12/03/2008), 35.7 (10/13/2007)  LDL:89 (12/03/2008), 100 (04/54/0981)  Chol:149 (12/03/2008), 154 (10/13/2007)  Trig:83.0 (12/03/2008), 91 (10/13/2007)  Problem # 4:  GERD (ICD-530.81) Assessment: Unchanged  Stable. His updated medication list for this problem includes:    Carafate 1 Gm Tabs (Sucralfate) .Marland Kitchen... 1 daioly by mouth  Diagnostics Reviewed:  Discussed lifestyle modifications, diet, antacids/medications, and preventive measures. Handout provided.   Problem # 5:  GAS/BLOATING (ICD-787.3) Assessment: Improved Cont Carafate.  Problem # 6:  BENIGN PROSTATIC HYPERTROPHY (ICD-600.00) Assessment: Unchanged  Problem # 7:  ANXIETY STATE, UNSPECIFIED (ICD-300.00) Assessment: Unchanged In general doing fine. Uses Valium very infrequently. May continue. His updated medication list for this problem includes:    Valium 5 Mg Tabs (Diazepam) .Marland Kitchen... 1/4 tab at bedtime as needed  Complete Medication List: 1)  Enalapril Maleate 5 Mg Tabs (Enalapril maleate) .Marland Kitchen.. 1 every day 2)  Furosemide 20 Mg Tabs (Furosemide) .... 1/2 tablet every day 3)  Carvedilol 6.25 Mg Tabs (Carvedilol) .Marland Kitchen.. 1 tab twice a day 4)  Mucus Relief 400 Mg Tabs (Guaifenesin) .Marland Kitchen.. 1 tab every day 5)  Nasarel 29 Mcg/act Soln (Flunisolide) .Marland Kitchen.. 1 spray every day 6)  Singulair 10 Mg Tabs (Montelukast sodium) .Marland Kitchen.. 1 tab every day as needed 7)  Equate Allergy Relief 10mg   .... 1 tab every day 8)  Valium 5 Mg Tabs (Diazepam) .... 1/4 tab at bedtime as needed 9)  Carafate 1 Gm Tabs (Sucralfate) .Marland Kitchen.. 1 daioly by mouth 10)  Advair Diskus 100-50 Mcg/dose  Aepb (Fluticasone-salmeterol) .Marland Kitchen.. 1 inhalation twice a day by mouth 11)  Astepro 0.15 % Soln (Azelastine hcl) .Marland Kitchen.. 1 spray each nostril as needed  Patient Instructions: 1)  RTC  as needed  Prescriptions: VALIUM 5 MG  TABS (DIAZEPAM) 1/4 tab at bedtime as needed  #50 x 0   Entered and Authorized by:   Shaune Leeks MD   Signed by:   Shaune Leeks MD on 12/08/2008   Method used:   Print then Give to Patient   RxID:   [id number]

## 2010-05-09 NOTE — Medication Information (Signed)
Summary: Coumadin Clinic  Anticoagulant Therapy  Managed by: Charlena Cross, RN, BSN Referring MD: Tonette Lederer, Fayrene Fearing PCP: Shaune Leeks MD Supervising MD: Ladona Ridgel MD, Sharlot Gowda Indication 1: Atrial Fibrillation [place] Site: [place] INR POC 2.0  Dietary changes: no    Health status changes: no    Bleeding/hemorrhagic complications: no    Recent/future hospitalizations: no    Any changes in medication regimen? no    Recent/future dental: no  Any missed doses?: no       Is patient compliant with meds? yes       Allergies: 1)  ! Penicillin 2)  ! Terramycin 3)  ! Prilosec 4)  ! Biaxin 5)  ! Pepcid 6)  ! * Zegerid 7)  ! * Ranitidine  Anticoagulation Management History:      The patient is taking warfarin and comes in today for a routine follow up visit.  Positive risk factors for bleeding include an age of 75 years or older.  The bleeding index is 'intermediate risk'.  Positive CHADS2 values include History of CHF and Age > 70 years old.  His last INR was 2.25.  Anticoagulation responsible Niv Darley: Ladona Ridgel MD, Sharlot Gowda.  INR POC: 2.0.    Anticoagulation Management Assessment/Plan:      The patient's current anticoagulation dose is Warfarin sodium 5 mg tabs: Use as directed by Anticoagulation Clinic.  The target INR is 2.0-3.0.  The next INR is due 03/16/2009.  Anticoagulation instructions were given to patient.  Results were reviewed/authorized by Charlena Cross, RN, BSN.  He was notified by Charlena Cross, RN, BSN.         Prior Anticoagulation Instructions: coumadin 7.5 mg today then resume 5mg  daily   Current Anticoagulation Instructions: pt is stopping coumadin to start pradaxa. will recheck inr on Wed.

## 2010-05-09 NOTE — Miscellaneous (Signed)
Summary: [facility]/Dr. Poteau Callas   Imported By: Eleonore Chiquito 05/24/2008 16:31:42  _____________________________________________________________________  External Attachment:    Type:   Image     Comment:   External Document

## 2010-05-09 NOTE — Letter (Signed)
Summary: [facility]  [facility]   Imported By: Sherian Rein 12/28/2009 11:12:06  _____________________________________________________________________  External Attachment:    Type:   Image     Comment:   External Document  Appended Document: [facility]    Clinical Lists Changes  Observations: Added new observation of PAST MED HX: LBBB (ICD-426.3) ICD - IN SITU (ICD-V45.02) - EF 35% - (06/2004) CARDIOMYOPATHY, SECONDARY (ICD-425.9) - (06/2004) HYPERLIPIDEMIA (ICD-272.4) - (05/2000) COPD  VIA CXR (ICD-496) - :(07/2003) GERD (ICD-530.81) - via egd withh.h., acute gastritis, duodenitis:(12/2004) GAS/BLOATING (ICD-787.3) GOITER (ICD-240.9) PORPHYRIA (ICD-277.1) IBS (ICD-564.1) BENIGN PROSTATIC HYPERTROPHY (ICD-600.00) - (1995) DIVERTICULITIS, HX OF (ICD-V12.79) - , hx of:(01/1998) ALLERGIC RHINITIS (ICD-477.9) - and asthma per Dr. Brewster Callas PAIN IN JOINT, PELVIS/THIGH (RIGHT BUTTOCK) (ICD-719.45) HEEL PAIN, RIGHT (ICD-729.5) SPECIAL SCREENING MALIG NEOPLASMS OTHER SITES (ICD-V76.49) (12/28/2009 15:32)       Past History:  Past Medical History: LBBB (ICD-426.3) ICD - IN SITU (ICD-V45.02) - EF 35% - (06/2004) CARDIOMYOPATHY, SECONDARY (ICD-425.9) - (06/2004) HYPERLIPIDEMIA (ICD-272.4) - (05/2000) COPD  VIA CXR (ICD-496) - :(07/2003) GERD (ICD-530.81) - via egd withh.h., acute gastritis, duodenitis:(12/2004) GAS/BLOATING (ICD-787.3) GOITER (ICD-240.9) PORPHYRIA (ICD-277.1) IBS (ICD-564.1) BENIGN PROSTATIC HYPERTROPHY (ICD-600.00) - (1995) DIVERTICULITIS, HX OF (ICD-V12.79) - , hx of:(01/1998) ALLERGIC RHINITIS (ICD-477.9) - and asthma per Dr.  Callas PAIN IN JOINT, PELVIS/THIGH (RIGHT BUTTOCK) (ICD-719.45) HEEL PAIN, RIGHT (ICD-729.5) SPECIAL SCREENING MALIG NEOPLASMS OTHER SITES (ICD-V76.49)

## 2010-05-09 NOTE — Letter (Signed)
Summary: Dr.Ranjan [name],[facility],Note  Dr.Ranjan [name],[facility],Note   Imported By: Beau Fanny 06/24/2009 16:33:26  _____________________________________________________________________  External Attachment:    Type:   Image     Comment:   External Document

## 2010-05-09 NOTE — Letter (Signed)
Summary: Remote Device Check  [facility], Main Office  [address]   [place], [place] [address]   Phone: [phone]  Fax: [phone]     October 21, 2009 MRN: [id number]   [name] [address] [place], [place]  [address]   Dear [name],   Your remote transmission was recieved and reviewed by your physician.  All diagnostics were within normal limits for you.  __X___Your next transmission is scheduled for:   12-15-2009.  Please transmit at any time this day.  If you have a wireless device your transmission will be sent automatically.  ______Your next office visit is scheduled for:                              . Please call our office to schedule an appointment.    Sincerely,  Vella Kohler

## 2010-05-09 NOTE — Medication Information (Signed)
Summary: ccr  Anticoagulant Therapy  Managed by: Charlena Cross, RN, BSN Referring MD: Tonette Lederer, Fayrene Fearing PCP: Shaune Leeks MD Supervising MD: Clifton James MD, Cristal Deer Indication 1: Atrial Fibrillation [place] Site: [place] INR POC 2.3  Dietary changes: no    Health status changes: no    Bleeding/hemorrhagic complications: no    Recent/future hospitalizations: no    Any changes in medication regimen? no    Recent/future dental: no  Any missed doses?: no       Is patient compliant with meds? yes       Allergies: 1)  ! Penicillin 2)  ! Terramycin 3)  ! Prilosec 4)  ! Biaxin 5)  ! Pepcid 6)  ! * Zegerid 7)  ! * Ranitidine  Anticoagulation Management History:      The patient is taking warfarin and comes in today for a routine follow up visit.  Positive risk factors for bleeding include an age of 38 years or older.  The bleeding index is 'intermediate risk'.  Positive CHADS2 values include History of CHF and Age > 17 years old.  Anticoagulation responsible provider: Clifton James MD, Cristal Deer.  INR POC: 2.3.    Anticoagulation Management Assessment/Plan:      The patient's current anticoagulation dose is Warfarin sodium 5 mg tabs: Use as directed by Anticoagulation Clinic.  The target INR is 2.0-3.0.  The next INR is due 02/25/2009.  Anticoagulation instructions were given to patient.  Results were reviewed/authorized by Charlena Cross, RN, BSN.  He was notified by Charlena Cross, RN, BSN.         Prior Anticoagulation Instructions: coumadin 5mg  daily  Current Anticoagulation Instructions: CONTINUE ON CURRENT DOSE (5MG  DAILY) UNTIL PROCEDURE 11/18.

## 2010-05-09 NOTE — Progress Notes (Signed)
Summary: refill request pt is out medication need today  Phone Note Refill Request Message from:  Patient on March 09, 2010 12:33 PM  Refills Requested: Medication #1:  ENALAPRIL MALEATE 5 MG  TABS 1 by mouth two times a day [facility] [address]   Method Requested: Telephone to Pharmacy Initial call taken by: Glynda Jaeger,  March 09, 2010 12:34 PM  Follow-up for Phone Call        RX sent into pharmacy. Pt notified. Marrion Coy, CNA  March 10, 2010 4:10 PM  Follow-up by: Marrion Coy, CNA,  March 10, 2010 4:10 PM    New/Updated Medications: ENALAPRIL MALEATE 5 MG  TABS (ENALAPRIL MALEATE) 1 by mouth two times a day Prescriptions: ENALAPRIL MALEATE 5 MG  TABS (ENALAPRIL MALEATE) 1 by mouth two times a day  #60 x 6   Entered by:   Marrion Coy, CNA   Authorized by:   Rollene Rotunda, MD, [facility]   Signed by:   Marrion Coy, CNA on 03/10/2010   Method used:   [facility]        [id number]ail[facility]       [address]       [place], [place]  [address]       Ph: [phone] or [phone]       Fax: [phone]   RxID:   [id number]

## 2010-05-09 NOTE — Progress Notes (Signed)
Summary: question on meds  Phone Note Call from Patient Call back at Home Phone [phone]   Caller: Patient Reason for Call: Talk to Nurse Summary of Call: pt has question on meds. Initial call taken by: Roe Coombs,  February 22, 2010 9:17 AM  Follow-up for Phone Call        spoke w/pt and he has problem cutting pills in half exactly and ends up with "crumbs" of medicine to take.  The end result is fluid build up in his ankles.  He feels that if he can keep taking around 10mg  or better of Fursemide the edema is controlled.  The problem is he only gets 10 pills from the pharmacy and it is hard to be exact  with cutting them. Do you want to increase his lasix to 20 since it doesn't come in a 10mg  tab?   Follow-up by: Claris Gladden RN,  February 22, 2010 9:35 AM  Additional Follow-up for Phone Call Additional follow up Details #1::        will prescribe 10mg  Furosemide solution for pt to try daily. Pt will let us know if he has further problems.  Additional Follow-up by: Claris Gladden RN,  February 22, 2010 10:06 AM    New/Updated Medications: FUROSEMIDE 10 MG/ML SOLN (FUROSEMIDE) once daily Prescriptions: FUROSEMIDE 10 MG/ML SOLN (FUROSEMIDE) once daily  #30 x 11   Entered by:   Claris Gladden RN   Authorized by:   Rollene Rotunda, MD, [facility]   Signed by:   Claris Gladden RN on 02/22/2010   Method used:   [facility]        [id number]       [address]       [place], [place]  [address]       Ph: [phone] or [phone]       Fax: [phone]   RxID:   [id number]

## 2010-05-09 NOTE — Letter (Signed)
Summary: Remote Device Check  [facility], Main Office  [address]   [place], [place] [address]   Phone: [phone]  Fax: [phone]     June 21, 2009 MRN: [id number]   [name] [address] [place], [place]  [address]   Dear Mr. Cosey,   Your remote transmission was recieved and reviewed by your physician.  All diagnostics were within normal limits for you.  __X___Your next transmission is scheduled for: September 13, 2009.  Please transmit at any time this day.  If you have a wireless device your transmission will be sent automatically.     Sincerely,  Proofreader

## 2010-05-09 NOTE — Assessment & Plan Note (Signed)
Summary: ST,CONGESTION/CLE   Vital Signs:  Patient Profile:   77 Years Old Male Height:     65 inches (175.26 cm) Weight:      150 pounds Temp:     97.8 degrees F oral Pulse rate:   60 / minute Pulse rhythm:   regular BP sitting:   100 / 70  (left arm) Cuff size:   regular  Vitals Entered By: Providence Crosby (May 13, 2008 12:23 PM)                 Chief Complaint:  sore throat and congestion.  History of Present Illness: Here for congestion, using his regular meds. He has been out in the cold, snowy weather recently. His allergy doctor is out of town. Has sneezed a lot every day.    Prior Medications Reviewed Using: List Brought by Patient  Current Allergies (reviewed today): ! PENICILLIN ! TERRAMYCIN ! PRILOSEC ! BIAXIN ! PEPCID      Physical Exam  General:     Well-developed,well-nourished,in no acute distress; alert,appropriate and cooperative throughout examination, congested and slightly hoarse. Head:     Normocephalic and atraumatic without obvious abnormalities. No apparent alopecia or balding. Sinuses nontender. Eyes:     Conjunctiva clear bilaterally.  Ears:     External ear exam shows no significant lesions or deformities.  Otoscopic examination reveals clear canals, tympanic membranes are intact bilaterally without bulging, retraction, inflammation or discharge. Hearing is grossly normal bilaterally. Nose:     Mildly inflamerd. Mouth:     Oral mucosa and oropharynx without lesions or exudates.  Teeth in good repair. Minimal erythema in pharynx. Neck:     No deformities, masses, or tenderness noted. Chest Wall:     No deformities, masses, tenderness or gynecomastia noted. Lungs:     Normal respiratory effort, chest expands symmetrically. Lungs are clear to auscultation, no crackles or wheezes. Heart:     Normal rate and regular rhythm. S1 and S2 normal without gallop, murmur, click, rub or other extra sounds.    Impression &  Recommendations:  Problem # 1:  ALLERGIC RHINITIS (ICD-477.9) Assessment: Unchanged Mildly exacerbated. Incr meds and pulse with steroids. His updated medication list for this problem includes:    Astelin 137 Mcg/spray Soln (Azelastine hcl) .Marland Kitchen... 1 spray each nostril qd    Nasarel 29 Mcg/act Soln (Flunisolide) .Marland Kitchen... 1 spray every day   Problem # 2:  BRONCHITIS, ALLERGIC (ICD-493.90) Assessment: New See instructions. His updated medication list for this problem includes:    Singulair 10 Mg Tabs (Montelukast sodium) .Marland Kitchen... 1 tab every day as needed    Prednisone 50 Mg Tabs (Prednisone) ..... One tab by mouth qam   Complete Medication List: 1)  Enalapril Maleate 5 Mg Tabs (Enalapril maleate) .Marland Kitchen.. 1 every day 2)  Furosemide 20 Mg Tabs (Furosemide) .... 1/2 tablet every day as needed 3)  Carvedilol 6.25 Mg Tabs (Carvedilol) .Marland Kitchen.. 1 tab twice a day 4)  Mucus Relief 400 Mg Tabs (Guaifenesin) .Marland Kitchen.. 1 tab every day 5)  Astelin 137 Mcg/spray Soln (Azelastine hcl) .Marland Kitchen.. 1 spray each nostril qd 6)  Nasarel 29 Mcg/act Soln (Flunisolide) .Marland Kitchen.. 1 spray every day 7)  Singulair 10 Mg Tabs (Montelukast sodium) .Marland Kitchen.. 1 tab every day as needed 8)  Equate Allergy Relief 10mg   .... 1 tab every day 9)  Zegerid 40-1100 Mg Caps (Omeprazole-sodium bicarbonate) .Marland Kitchen.. 1 tab  every other day 10)  Valium 5 Mg Tabs (Diazepam) .... 1/4 tab at bedtime as  needed 11)  Zithromax Z-pak 250 Mg Tabs (Azithromycin) .... As dir 12)  Prednisone 50 Mg Tabs (Prednisone) .... One tab by mouth qam   Patient Instructions: 1)  Start Zithromax. 2)  Start Prednisone 50mg  in AM for 5 days. 3)  Start Zyrtec 10mg  daily 4)  Start Take Guaifenesin by [facility] or [facility] and getting MUCOUS RELIEF EXPECTORANT (400mg ), take 11/2 tabs by mouth AM and NOON. 5)  Drink lots of fluids anytime taking Guaifenesin.  6)  Take Singulair regularly. 7)  25 mins spent with pt.   Prescriptions: PREDNISONE 50 MG TABS  (PREDNISONE) one tab by mouth qAM  #5 x 0   Entered and Authorized by:   Shaune Leeks MD   Signed by:   Shaune Leeks MD on 05/13/2008   Method used:   Print then Give to Patient   RxID:   [id number] Christena Deem Z-PAK 250 MG TABS (AZITHROMYCIN) as dir  #1 pak x 0   Entered and Authorized by:   Shaune Leeks MD   Signed by:   Shaune Leeks MD on 05/13/2008   Method used:   Print then Give to Patient   RxID:   [id number]

## 2010-05-09 NOTE — Assessment & Plan Note (Signed)
Summary: rov   Primary Provider:  Shaune Leeks MD  CC:  no complaints; wants to discuss ablation.  History of Present Illness: [name] returns today for followup of his atrial flutter and CHF.  He is a pleasant 77 yo man with a h/o DCM, LBBB, and is s/p BiV ICD.  He has had increasing dyspnea and was found to be in atrial flutter with a RVR.  When I saw him 2 months ago, I recommended atrial flutter ablation after several weeks of therapeutic coumadin.  He wanted to speak to Dr. Antoine Poche about this and has and returns for followup.  He denies c/p.  Still class 2 CHF symptoms. No intercurrent ICD therapies.  Current Medications (verified): 1)  Enalapril Maleate 5 Mg  Tabs (Enalapril Maleate) .Marland Kitchen.. 1 Every Day 2)  Furosemide 20 Mg  Tabs (Furosemide) .... 1/2 Tablet Every Day 3)  Carvedilol 6.25 Mg  Tabs (Carvedilol) .Marland Kitchen.. 1 Tab Twice A Day 4)  Mucus Relief 400 Mg  Tabs (Guaifenesin) .Marland Kitchen.. 1 Tab Every Day 5)  Nasarel 29 Mcg/act  Soln (Flunisolide) .Marland Kitchen.. 1 Spray Every Day 6)  Singulair 10 Mg  Tabs (Montelukast Sodium) .Marland Kitchen.. 1 Tab Every Day As Needed 7)  Equate Allergy Relief 10mg  .... 1 Tab Every Day 8)  Valium 5 Mg  Tabs (Diazepam) .... 1/4 Tab At Bedtime As Needed 9)  Carafate 1 Gm Tabs (Sucralfate) .... 1/2 Tablet By Mouth As Needed 10)  Advair Diskus 100-50 Mcg/dose Aepb (Fluticasone-Salmeterol) .Marland Kitchen.. 1 Inhalation Twice A Day By Mouth 11)  Astepro 0.15 % Soln (Azelastine Hcl) .Marland Kitchen.. 1 Spray Each Nostril As Needed 12)  Warfarin Sodium 5 Mg Tabs (Warfarin Sodium) .... Use As Directed By Anticoagulation Clinic  Allergies (verified): 1)  ! Penicillin 2)  ! Terramycin 3)  ! Prilosec 4)  ! Biaxin 5)  ! Pepcid 6)  ! * Zegerid 7)  ! * Ranitidine  Past History:  Past Medical History: Last updated: 01/12/2009 LBBB (ICD-426.3) ICD - IN SITU (ICD-V45.02) - EF 35% - (06/2004) CARDIOMYOPATHY, SECONDARY (ICD-425.9) - (06/2004) HYPERLIPIDEMIA (ICD-272.4) - (05/2000) COPD  VIA CXR  (ICD-496) - :(07/2003) GERD (ICD-530.81) - via egd withh.h., acute gastritis, duodenitis:(12/2004) GAS/BLOATING (ICD-787.3) GOITER (ICD-240.9) PORPHYRIA (ICD-277.1) IBS (ICD-564.1) BENIGN PROSTATIC HYPERTROPHY (ICD-600.00) - (1995) DIVERTICULITIS, HX OF (ICD-V12.79) - , hx of:(01/1998) ALLERGIC RHINITIS (ICD-477.9) - 2002 PAIN IN JOINT, PELVIS/THIGH (RIGHT BUTTOCK) (ICD-719.45) HEEL PAIN, RIGHT (ICD-729.5) SPECIAL SCREENING MALIG NEOPLASMS OTHER SITES (ICD-V76.49)  Past Surgical History: Last updated: 10/27/2008 PENILE PROSTHESIS CHOLEYCYSTECTOMY GOITER REMOVAL CATH CLEART MID 1990s NASAL SURGERY(:1971) COLONOSCOPY;  DIVERTIC,SPLENIC, HEPATIC FLEURE ONLY:(01/1998) EGD STRICTURE,SLIDING H/H. ; GERD:(01/1998) EGD, DILITATION:(02/18/1998) EGD STRICTURE ; GASTRITIS, H.H. , GERD NO DILITATION TODAY DR. PERRY:(04/08/2001) HERNIA REPAIR:(DR. 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 DUOD. (DR ELLIOTT) (10/21/2008) COLONOSCOPY   ABORTED   DIVERTICS INT HEMMS (DR ELLIOTT) (10/21/2008)  Vital Signs:  Patient profile:   77 year old male Weight:      148 pounds Pulse rate:   62 / minute Pulse rhythm:   irregularly irregular BP sitting:   120 / 62  (right arm) Cuff size:   regular  Vitals Entered By: Mercer Pod (February 14, 2009 9:45  AM)  Physical Exam  General:  Well developed, well nourished, in no acute distress. Head:  normocephalic and atraumatic Eyes:  PERRLA/EOM intact; conjunctiva and lids  normal. Mouth:  Gums and palate normal. Oral mucosa normal. Neck:  Neck supple, no JVD. No masses, thyromegaly or abnormal cervical nodes. Chest Wall:  Well healed ICD pocket Lungs:  Clear bilaterally to auscultation.  No wheezes, rales, or rhonchi. Heart:  IRIR with normal S1 and S2.  PMI is enlarged and laterally displaced.  No murmur was appreciated. Abdomen:  Bowel sounds positive; abdomen soft and non-tender without masses, organomegaly, or hernias noted. No hepatosplenomegaly. Msk:  Back normal, normal gait. Muscle strength and tone normal. Pulses:  normal pedal pulses bilaterally.   Extremities:  No clubbing or cyanosis. Neurologic:  Alert and oriented x 3.    ICD Specifications Following MD:  Lewayne Bunting, MD     ICD Vendor:  [employer]     ICD Model Number:  7299     ICD Serial Number:  [id number] ICD DOI:  08/31/2005     ICD Implanting MD:  Lewayne Bunting, MD  Lead 1:    Location: RA     DOI: 08/31/2005     Model #: [id number]     Serial #: [id number]     Status: active Lead 2:    Location: RV     DOI: 08/31/2005     Model #: [id number]     Serial #: [id number]     Status: active Lead 3:    Location: LV     DOI: 08/31/2005     Model #: [id number]     Serial #: [id number]     Status: active  Indications::  NICM, CHF   ICD Follow Up Remote Check?  No Battery Voltage:  2.96 V     Charge Time:  9.52 seconds     Underlying rhythm:  A-flutter ICD Dependent:  No       ICD Device Measurements Atrium:  Amplitude: 5.1 mV, Impedance: 552 ohms,  Right Ventricle:  Amplitude: 7.5 mV, Impedance: 584 ohms, Threshold: 1.0 V at 0.2 msec Left Ventricle:  Impedance: 376 ohms, Threshold: 1.0 V at 0.2 msec Shock Impedance: 49 ohms   Episodes MS Episodes:  1     Percent Mode Switch:  100%     Coumadin:  Yes Shock:  0     ATP:  0     Nonsustained:  0     Atrial Pacing:  0%     Ventricular Pacing:  45.7%  Brady Parameters Mode DDD     Lower Rate Limit:  60     Upper Rate Limit 130 PAV 130     Sensed AV Delay:   100  Tachy Zones VF:  200     VT:  240     VT1:  176     Next Remote Date:  05/17/2009     Next Cardiology Appt Due:  02/07/2010 Tech Comments:  A-flutter.  VP only 45%, + coumadin.  6949 lead stable, SIC  0.  Updated letter and magnet given to the patient.  To be set up for ablation.  Altha Harm, LPN  February 14, 2009 9:52 AM  MD Comments:  Agree with above.  Impression & Recommendations:  Problem # 1:  ATRIAL FLUTTER (ICD-427.32) I have discussed the risks, benefits, goals and expectations of catheter ablation with the patient and his wife and they wish to proceed. His updated medication list for  this problem includes:    Carvedilol 6.25 Mg Tabs (Carvedilol) .Marland Kitchen... 1 tab twice a day    Warfarin Sodium 5 Mg Tabs (Warfarin sodium) ..... Use as directed by anticoagulation clinic  Problem # 2:  CONGESTIVE HEART FAILURE (ICD-428.0) His symptoms appear to be controlled.  Continue meds as noted below. His updated medication list for this problem includes:    Enalapril Maleate 5 Mg Tabs (Enalapril maleate) .Marland Kitchen... 1 every day    Furosemide 20 Mg Tabs (Furosemide) .Marland Kitchen... 1/2 tablet every day    Carvedilol 6.25 Mg Tabs (Carvedilol) .Marland Kitchen... 1 tab twice a day    Warfarin Sodium 5 Mg Tabs (Warfarin sodium) ..... Use as directed by anticoagulation clinic  Problem # 3:  ICD - IN SITU (ICD-V45.02) His device is working normally but he is only biV pacing about 50% of the time as he is in atrial flutter.  Appended Document: rov    Clinical Lists Changes  Orders: Added new Referral order of Ablation (Ablation) - Signed Observations: Added new observation of PI CARDIO: Your physician recommends that you return for lab work in: Friday Your physician has recommended that you have an ablation.  Catheter ablation is a medical procedure used to treat some cardiac arrhythmias (irregular heartbeats). During catheter ablation, a long, thin, flexible tube is put into a blood vessel in your groin (upper  thigh), or neck. This tube is called an ablation catheter. It is then guided to your heart through the blood vessel. Radiofrequency waves destroy small areas of heart tissue where abnormal heartbeats may cause an arrhythmia to start.  Please see the instruction sheet given to you today. (02/14/2009 10:09)       Patient Instructions: 1)  Your physician recommends that you return for lab work in: Friday 2)  Your physician has recommended that you have an ablation.  Catheter ablation is a medical procedure used to treat some cardiac arrhythmias (irregular heartbeats). During catheter ablation, a long, thin, flexible tube is put into a blood vessel in your groin (upper thigh), or neck. This tube is called an ablation catheter. It is then guided to your heart through the blood vessel. Radiofrequency waves destroy small areas of heart tissue where abnormal heartbeats may cause an arrhythmia to start.  Please see the instruction sheet given to you today.

## 2010-05-09 NOTE — Assessment & Plan Note (Signed)
Summary: STOMACH PROBLEMS/LDO   Vital Signs:  Patient profile:   77 year old male Weight:      143 pounds Temp:     97.7 degrees F oral Pulse rate:   64 / minute Pulse rhythm:   regular BP sitting:   100 / 70  (left arm) Cuff size:   regular  Vitals Entered By: Sydell Axon (September 22, 2008 10:20 AM) CC: Stomach pain, burping alot, BP has been low, patient thinks that he is taking Enalapril 2 daily   History of Present Illness: Pt here for epigastric abd pain that eases off then worsens again. Had been on Zegerid and had to stop it because he was developing RUQ pain presumably from gas as he was burping a lot. It stopped when he stopped the Zegerid but then he developed the epigastric discomfort, worsened after eating. He has been under significant stress latley with his son having GI problems and also marital difficulties. Most of that is now ironed out. He has lost some weight laelty. He continues to have BMs and appetite is reasonably nml. He has been seen by Dr Marina Goodell but prefers to see Dr Mechele Collin in [place] as that is who recently treated his son.  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 1 Every Day 2)  Furosemide 20 Mg  Tabs (Furosemide) .... 1/2 Tablet Every Day 3)  Carvedilol 6.25 Mg  Tabs  (Carvedilol) .Marland Kitchen.. 1 Tab Twice A Day 4)  Mucus Relief 400 Mg  Tabs (Guaifenesin) .Marland Kitchen.. 1 Tab Every Day 5)  Astelin 137 Mcg/spray  Soln (Azelastine Hcl) .Marland Kitchen.. 1 Spray Each Nostril Qd 6)  Nasarel 29 Mcg/act  Soln (Flunisolide) .Marland Kitchen.. 1 Spray Every Day 7)  Singulair 10 Mg  Tabs (Montelukast Sodium) .Marland Kitchen.. 1 Tab Every Day As Needed 8)  Equate Allergy Relief 10mg  .... 1 Tab Every Day 9)  Zegerid 40-1100 Mg  Caps (Omeprazole-Sodium Bicarbonate) .Marland Kitchen.. 1 Tab  Every Other Day Not Taken For 2 Days 10)  Valium 5 Mg  Tabs (Diazepam) .... 1/4 Tab At Bedtime As Needed 11)  Zithromax Z-Pak 250 Mg Tabs (Azithromycin) .... As Dir  Allergies: 1)  ! Penicillin 2)  ! Terramycin 3)  ! Prilosec 4)  ! Biaxin 5)  ! Pepcid  Physical Exam  General:  Well-developed,well-nourished,in no acute distress; alert,appropriate and cooperative throughout examination, congested. Head:  Normocephalic and atraumatic without obvious abnormalities. No apparent alopecia or balding. Sinuses nontender. Eyes:  Conjunctiva clear bilaterally.  Ears:  External ear exam shows no significant lesions or deformities.  Otoscopic examination reveals clear canals, tympanic membranes are intact bilaterally without bulging, retraction, inflammation or discharge. Hearing is grossly normal bilaterally. Nose:  External nasal examination shows no deformity or inflammation. Nasal mucosa are pink and moist without lesions or  exudates. Mouth:  Oral mucosa and oropharynx without lesions or exudates.  Teeth in good repair. Minimal erythema in pharynx. Neck:  No deformities, masses, or tenderness noted. Chest Wall:  No deformities, masses, tenderness or gynecomastia noted. Lungs:  Normal respiratory effort, chest expands symmetrically. Lungs are clear to auscultation, no crackles or wheezes. Minimal ronchi centrally. Heart:  Normal rate and regular rhythm. S1 and S2 normal without gallop, murmur, click, rub or other extra sounds. Abdomen:  Bowel sounds  positive,abdomen soft and minimally tender without masses, organomegaly or hernias noted. Tenderness with direct palpation just lateral to middle of prior classic choleycystectomy scar. Nontender to direct palpation, area of discomfort is directly in the epigastric area.   Impression & Recommendations:  Problem # 1:  ABDOMINAL PAIN, EPIGASTRIC (ICD-789.06) Assessment New  Long h/o GERD off PPI for a while now...prob flare of GERD, could have developed and ulcer. Will refer to GI for eval and give sampls of  Kapidex....to take one two times a day for 2 weeks then once daily. Orders: Gastroenterology Referral (GI)  Discussed symptom control with the patient.   Problem # 2:  GERD (ICD-530.81) Assessment: Deteriorated Exacerbated acutely. Refer for eval. The following medications were removed from the medication list:    Zegerid 40-1100 Mg Caps (Omeprazole-sodium bicarbonate) .Marland Kitchen... 1 tab  every other day not taken for 2 days  Orders: Gastroenterology Referral (GI)  Complete Medication List: 1)  Enalapril Maleate 5 Mg Tabs (Enalapril maleate) .Marland Kitchen.. 1 every day 2)  Furosemide 20 Mg Tabs (Furosemide) .... 1/2 tablet every day 3)  Carvedilol 6.25 Mg Tabs (Carvedilol) .Marland Kitchen.. 1 tab twice a day 4)  Mucus Relief 400 Mg Tabs (Guaifenesin) .Marland Kitchen.. 1 tab every day 5)  Astelin 137 Mcg/spray Soln (Azelastine hcl) .Marland Kitchen.. 1 spray each nostril qd 6)  Nasarel 29 Mcg/act Soln (Flunisolide) .Marland Kitchen.. 1 spray every day 7)  Singulair 10 Mg Tabs (Montelukast sodium) .Marland Kitchen.. 1 tab every day as needed 8)  Equate Allergy Relief 10mg   .... 1 tab every day 9)  Valium 5 Mg Tabs (Diazepam) .... 1/4 tab at bedtime as needed  Patient Instructions: 1)  Refr to Dr Mechele Collin, [facility].  Current Allergies (reviewed today): ! PENICILLIN ! TERRAMYCIN ! PRILOSEC ! BIAXIN ! PEPCID

## 2010-05-09 NOTE — Cardiovascular Report (Signed)
Summary: [facility]   Imported By: Kassie Mends 10/26/2008 08:01:49  _____________________________________________________________________  External Attachment:    Type:   Image     Comment:   External Document

## 2010-05-09 NOTE — Letter (Signed)
Summary: [facility] - Discharge Instructions  [facility] - Discharge Instructions   Imported By: Marylou Mccoy 01/18/2010 07:53:09  _____________________________________________________________________  External Attachment:    Type:   Image     Comment:   External Document

## 2010-05-09 NOTE — Medication Information (Signed)
Summary: rov/ewj  Anticoagulant Therapy  Managed by: Bethena Midget, RN, BSN Referring MD: Tonette Lederer, Fayrene Fearing PCP: Eustaquio Boyden  MD Supervising MD: Mariah Milling Indication 1: Atrial Fibrillation Lab Used:  [facility] [place] Site: [place] INR POC 2.4 INR RANGE 2.0-3.0  Dietary changes: no    Health status changes: no    Bleeding/hemorrhagic complications: no    Recent/future hospitalizations: no    Any changes in medication regimen? no    Recent/future dental: no  Any missed doses?: no       Is patient compliant with meds? yes       Allergies: 1)  ! Penicillin 2)  ! Terramycin 3)  ! Prilosec 4)  ! Biaxin 5)  ! Pepcid 6)  ! * Zegerid 7)  ! * Ranitidine  Anticoagulation Management History:      The patient is taking warfarin and comes in today for a routine follow up visit.  Positive risk factors for bleeding include an age of 75 years or older.  The bleeding index is 'intermediate risk'.  Positive CHADS2 values include History of CHF and Age > 50 years old.  His last INR was 2.25.  Anticoagulation responsible provider: Herley Bernardini.  INR POC: 2.4.  Cuvette Lot#: [id number].  Exp: 03/2011.    Anticoagulation Management Assessment/Plan:      The patient's current anticoagulation dose is Warfarin sodium 5 mg tabs: Use as directed by Anticoagulation Clinic, Warfarin sodium 5 mg tabs: Use as directed by Anticoagulation Clinic.  The target INR is 2.0-3.0.  The next INR is due 03/29/2010.  Anticoagulation instructions were given to patient.  Results were reviewed/authorized by Bethena Midget, RN, BSN.  He was notified by Bethena Midget, RN, BSN.         Prior Anticoagulation Instructions: INR 2.4  Continue on same dosage 1 tablet daily except 1.5 tablets on Mondays, Wednesdays, and Fridays.  Recheck in 4 weeks.    Current Anticoagulation Instructions: INR 2.4 Continue 5mg s everyday except 7.5mg s on Mondays, Wednesdays and Fridays. Recheck in 4 weeks.

## 2010-05-09 NOTE — Medication Information (Signed)
Summary: rov/ewj  Anticoagulant Therapy  Managed by: Cloyde Reams, RN, BSN Referring MD: Tonette Lederer, Fayrene Fearing PCP: Shaune Leeks MD Supervising MD: Mariah Milling Indication 1: Atrial Fibrillation [place] Site:  INR POC 2.8 INR RANGE 2.0-3.0  Dietary changes: no    Health status changes: no    Bleeding/hemorrhagic complications: no    Recent/future hospitalizations: no    Any changes in medication regimen? no    Recent/future dental: no  Any missed doses?: no       Is patient compliant with meds? yes       Allergies: 1)  ! Penicillin 2)  ! Terramycin 3)  ! Prilosec 4)  ! Biaxin 5)  ! Pepcid 6)  ! * Zegerid 7)  ! * Ranitidine  Anticoagulation Management History:      The patient is taking warfarin and comes in today for a routine follow up visit.  Positive risk factors for bleeding include an age of 75 years or older.  The bleeding index is 'intermediate risk'.  Positive CHADS2 values include History of CHF and Age > 12 years old.  His last INR was 2.25.  Anticoagulation responsible provider: [facility].  INR POC: 2.8.  Cuvette Lot#: [id number].  Exp: 01/2011.    Anticoagulation Management Assessment/Plan:      The patient's current anticoagulation dose is Warfarin sodium 5 mg tabs: Use as directed by Anticoagulation Clinic.  The target INR is 2.0-3.0.  The next INR is due 01/11/2010.  Anticoagulation instructions were given to patient.  Results were reviewed/authorized by Cloyde Reams, RN, BSN.  He was notified by Cloyde Reams RN.         Prior Anticoagulation Instructions: INR 2.2  Continue on same dosage 1 tablet daily except 1.5 tablets on Mondays, Wednesdays, and Fridays.  Recheck in 4 weeks.    Current Anticoagulation Instructions: INR 2.8  Continue on same dosage 1 tablet daily except 1.5 tablets on Mondays, Wednesdays, and Fridays.  Recheck in 4 weeks.

## 2010-05-09 NOTE — Medication Information (Signed)
Summary: CCR/AMD  Anticoagulant Therapy  Managed by: Cloyde Reams, RN, BSN Referring MD: Tonette Lederer, Fayrene Fearing PCP: Shaune Leeks MD Supervising MD: Mariah Milling Indication 1: Atrial Fibrillation [place] Site: [facility] INR POC 1.7 INR RANGE 2.0-3.0  Dietary changes: yes       Details: Incr vit K intake.   Bleeding/hemorrhagic complications: no     Any changes in medication regimen? no     Any missed doses?: no       Is patient compliant with meds? yes      Comments: Pt requests 3 week f/u, aware of risks.  Allergies: 1)  ! Penicillin 2)  ! Terramycin 3)  ! Prilosec 4)  ! Biaxin 5)  ! Pepcid 6)  ! * Zegerid 7)  ! * Ranitidine  Anticoagulation Management History:      The patient is taking warfarin and comes in today for a routine follow up visit.  Positive risk factors for bleeding include an age of 55 years or older.  The bleeding index is 'intermediate risk'.  Positive CHADS2 values include History of CHF and Age > 3 years old.  His last INR was 2.25.  Anticoagulation responsible provider: [facility].  INR POC: 1.7.  Cuvette Lot#: [id number].  Exp: 11/2010.    Anticoagulation Management Assessment/Plan:      The patient's current anticoagulation dose is Warfarin sodium 5 mg tabs: Use as directed by Anticoagulation Clinic.  The target INR is 2.0-3.0.  The next INR is due 09/19/2009.  Anticoagulation instructions were given to patient.  Results were reviewed/authorized by Cloyde Reams, RN, BSN.  He was notified by Cloyde Reams RN.         Prior Anticoagulation Instructions: coumadin 5 mg daily with 7.5 mg on Mondays  Current Anticoagulation Instructions: INR 1.7  Take 2 tablets today,  then start taking 1 tablet daily except 1.5 tablets on Mondays and Fridays.  Recheck in 2 weeks.

## 2010-05-09 NOTE — Letter (Signed)
Summary: Results Follow up Letter  [place] [facility]  [address] [place], [place] [address]   Phone: [phone]  Fax: [phone]    11[birth year]2007 MRN: [id number]  QUASHAWN JEWKES [address] [place], [place]  [address]  Dear Mr. Otte,  The following are the results of your recent test(s):  Test         Result    Pap Smear:        Normal _____  Not Normal _____ Comments: ______________________________________________________ Cholesterol: LDL(Bad cholesterol):         Your goal is less than:         HDL (Good cholesterol):       Your goal is more than: Comments:  ______________________________________________________ Mammogram:        Normal _____  Not Normal _____ Comments:  ___________________________________________________________________ Hemoccult:        Normal __x___  Not normal _______ Comments:  NO BLOOD IN STOOL. THANK YOU FOR RETURNING THE HEMOCCULT CARDS. PLEASE MAKE SURE TO REPEAT IN ONE YEAR.  _____________________________________________________________________ Other Tests:    We routinely do not discuss normal results over the telephone.  If you desire a copy of the results, or you have any questions about this information we can discuss them at your next office visit.   Sincerely,

## 2010-05-09 NOTE — Assessment & Plan Note (Signed)
Summary: HOSPITAL DISCHARGE [facility]AB[facility] FROM [facility]   Vital Signs:  Patient profile:   77 year old male Weight:      147 pounds Temp:     98.3 degrees F oral Pulse rate:   60 / minute Pulse rhythm:   regular BP sitting:   110 / 80  (left arm) Cuff size:   regular  Vitals Entered By: Selena Batten Dance CMA Duncan Dull) (January 16, 2010 3:47 PM) CC: Hospital follow up   History of Present Illness: CC: f/u hospital admission   77 yo with h/o aflutter on coumadin, CM with defibrillator and pacemaker, COPD presents for hospital f/u.  Recent admission 9/27-28 for lower GI bleed thought to be 2/2 internal hemorrhoids at [facility].  Pt with h/o "spasm of colon".  Coumadin stopped while hospitalized but now restarted.  h/o constipation per wife.  No more bleeding since out of hospital.  No recent dizziness, SOB, CP.  Recent colonoscopy this year.  reviewing records - aborted 2/2 significant diverticulosis.  h/o GERD with esophagitis on EGD.  No smokers at home.  Current Medications (verified): 1)  Enalapril Maleate 5 Mg  Tabs (Enalapril Maleate) .Marland Kitchen.. 1 By Mouth Two Times A Day 2)  Furosemide 20 Mg  Tabs (Furosemide) .... 1/2 Tablet Every Day 3)  Carvedilol 6.25 Mg  Tabs (Carvedilol) .Marland Kitchen.. 1 Tab Twice A Day 4)  Valium 5 Mg  Tabs (Diazepam) .... 1/4 Tab At Bedtime As Needed 5)  Carafate 1 Gm Tabs (Sucralfate) .... 1/2 Tablet By Mouth As Needed 6)  Warfarin Sodium 5 Mg Tabs (Warfarin Sodium) .... Use As Directed By Anticoagulation Clinic 7)  Nasarel 29 Mcg/act  Soln (Flunisolide) .Marland Kitchen.. 1 Spray Every Day 8)  Singulair 10 Mg  Tabs (Montelukast Sodium) .Marland Kitchen.. 1 Tab Every Day As Needed 9)  Astepro 0.15 % Soln (Azelastine Hcl) .Marland Kitchen.. 1 Spray Each Nostril As Needed 10)  Advair Diskus 100-50 Mcg/dose Aepb (Fluticasone-Salmeterol) .Marland Kitchen.. 1 Inhalation Twice A Day By Mouth 11)  Mucus Relief 400 Mg  Tabs (Guaifenesin) .Marland Kitchen.. 1 Tab Every Day As Needed 12)  Equate Allergy Relief 10mg  .... 1 Tab Every Day (Claritin  Generic)  Allergies: 1)  ! Penicillin 2)  ! Terramycin 3)  ! Prilosec 4)  ! Biaxin 5)  ! Pepcid 6)  ! * Zegerid 7)  ! * Ranitidine  Past History:  Past Medical History: Last updated: 01/11/2010 LBBB (ICD-426.3) ICD - IN SITU (ICD-V45.02) - EF 35% - (06/2004) CARDIOMYOPATHY, SECONDARY (ICD-425.9) - (06/2004) HYPERLIPIDEMIA (ICD-272.4) - (05/2000) COPD  VIA CXR (ICD-496) - :(07/2003) GERD (ICD-530.81) - via egd withh.h., acute gastritis, duodenitis:(12/2004) GAS/BLOATING (ICD-787.3) GOITER (ICD-240.9) PORPHYRIA (ICD-277.1) IBS (ICD-564.1) BENIGN PROSTATIC HYPERTROPHY (ICD-600.00) - (1995) DIVERTICULITIS, HX OF (ICD-V12.79) - , hx of:(01/1998) ALLERGIC RHINITIS (ICD-477.9) - and asthma per Dr. [place] Callas PAIN IN JOINT, PELVIS/THIGH (RIGHT BUTTOCK) (ICD-719.45) HEEL PAIN, RIGHT (ICD-729.5) SPECIAL SCREENING MALIG NEOPLASMS OTHER SITES (ICD-V76.49) Admission to [facility] with GI bleed 12/2009  Past Surgical History: Last updated: 02/23/2009 PENILE PROSTHESIS CHOLEYCYSTECTOMY GOITER REMOVAL CATH CLEART MID 1990s NASAL SURGERY(:1971) COLONOSCOPY;  DIVERTIC,SPLENIC, HEPATIC FLEURE ONLY:(01/1998) EGD STRICTURE,SLIDING H/H. ; GERD:(01/1998) EGD, DILITATION:(02/18/1998) EGD STRICTURE ; GASTRITIS, H.H. , GERD NO DILITATION TODAY DR. PERRY:(04/08/2001) HERNIA REPAIR:(DR. MARTIN):(01/30/2002) PLUMONARY EVALUATION ([facility]) CHRONIC CONGESTIVE  SYMPTOMS:(04/2002) CATHERIZATION, SEVERE NONISCHEMIA CARDIOMYOPATHY, EF 25 TO 30 %:(06/22/2004) ECHO EF25-30% ,MOD LVH; LA SEVERE DILATATION; MILD A.R.; IRTR:(07/13/2004) EGD, STRICTURE; GASTRITIS, DUODENITIS, GERD:(12/22/2004) BIVENTRICULAR ICD IMPLANTATION; 2ND TO CHF AND NON-ISCHEMICA CARDIOMYOPATHY:(12/02/2006) INFLATABLE PENILE PROTHESIS (DR. OTELIN):05/2005) ECHO HYPOKINESIS POSTERIOR WALL, EF 35%,  MILD AR:(11/27/2006) EGD REFLUX  ERYTHEM. DUOD. 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  Alcohol Use - no Regular Exercise - no Drug Use - no PMH-FH-SH reviewed for relevance  Review of Systems       per HPI  Physical Exam  General:  Well-developed,well-nourished,in no acute distress; alert,appropriate and cooperative throughout examination. Lungs:  Normal respiratory effort, chest expands symmetrically. Lungs are clear to auscultation, no crackles or wheezes. Heart:  Normal rate and regular rhythm. S1 and S2 normal without gallop, murmur, click, rub or other extra sounds. Pulses:  2+ periph pulses Extremities:  no edema   Impression & Recommendations:  Problem # 1:  RECTAL BLEEDING (ICD-569.3) thought 2/2 internal hemorrhoids.  check CBC to f/u low Hgb (10.9) and plt (86) during hospitalization, thought to be dilutional.  seems resolved.  discussed goal of 1 soft stool/day to prevent hemorrhoid flares.  start colace.  if not helping constipaiton, start miralax.  Orders: TLB-CBC Platelet - w/Differential ([id number])  Problem # 2:  GERD (ICD-530.81) doesn't tolerate several PPI/H2 blockers in past.  carafate seems to be only think that controls GERD intermittenly. His updated medication list for this problem includes:    Carafate 1 Gm Tabs (Sucralfate) .Marland Kitchen... 1/2 tablet by mouth as needed  Complete Medication List: 1)  Enalapril Maleate 5 Mg Tabs (Enalapril maleate) .Marland Kitchen.. 1 by mouth two times a day 2)  Furosemide 20 Mg Tabs (Furosemide) .... 1/2 tablet every day 3)  Carvedilol 6.25 Mg Tabs (Carvedilol) .Marland Kitchen.. 1 tab twice a day 4)  Valium 5 Mg Tabs (Diazepam) .... 1/4 tab at bedtime as needed 5)  Carafate 1 Gm Tabs (Sucralfate) .... 1/2 tablet by mouth as needed 6)  Warfarin Sodium 5 Mg Tabs (Warfarin sodium) .... Use as directed by  anticoagulation clinic 7)  Nasarel 29 Mcg/act Soln (Flunisolide) .Marland Kitchen.. 1 spray every day 8)  Singulair 10 Mg Tabs (Montelukast sodium) .Marland Kitchen.. 1 tab every day as needed 9)  Astepro 0.15 % Soln (Azelastine hcl) .Marland Kitchen.. 1 spray each nostril as needed 10)  Advair Diskus 100-50 Mcg/dose Aepb (Fluticasone-salmeterol) .Marland Kitchen.. 1 inhalation twice a day by mouth 11)  Mucus Relief 400 Mg Tabs (Guaifenesin) .Marland Kitchen.. 1 tab every day as needed 12)  Equate Allergy Relief 10mg   .... 1 tab every day (claritin generic) 13)  Docusate Sodium 100 Mg Caps (Docusate sodium) .... Take one pill daily as needed constipation, hold for diarrhea  Patient Instructions: 1)  Start Colace one pill daily.  Goal is 1 soft stool a day to help prevent constipation and hemorrhoidal irritation. 2)  Keep appointment with Dr. Antoine Poche 3)  Good to see you today, call clinic with questions. Prescriptions: DOCUSATE SODIUM 100 MG CAPS (DOCUSATE SODIUM) take one pill daily as needed constipation, hold for diarrhea  #30 x 3   Entered and Authorized by:   Eustaquio Boyden  MD   Signed by:   Eustaquio Boyden  MD on 01/16/2010   Method used:   Electronically to        [id number] * (retail)       [address] [facility], [place]  [address]  Ph: [phone] or [phone]       Fax: [phone]   RxID:   [id number]   Current Allergies (reviewed today): ! PENICILLIN ! TERRAMYCIN ! PRILOSEC ! BIAXIN ! PEPCID ! * ZEGERID ! * RANITIDINE  Appended Document: HOSPITAL DISCHARGE [facility] FROM [facility] correction - plt in hospital 118   Clinical Lists Changes  Observations: Added new observation of SOCIAL HX: Tobacco Use - No.  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 ; GERD:(01/1998)  DILITATION:(02/18/1998) EGD STRICTURE ; GASTRITIS, H.H. , GERD NO DILITATION (Dr. Marina Goodell) (04/08/2001) HERNIA REPAIR:(DR. Daphine Deutscher) (01/30/2002) PLUMONARY EVALUATION ([facility]) CHRONIC CONGESTIVE  SYMPTOMS:(04/2002) CATHERIZATION, SEVERE NONISCHEMIA CARDIOMYOPATHY, EF 25 TO 30 %:(06/22/2004) ECHO EF25-30% ,MOD LVH; LA SEVERE DILATATION; MILD A.R.; IRTR:(07/13/2004) EGD, STRICTURE; GASTRITIS, DUODENITIS, GERD:(12/22/2004) BIVENTRICULAR ICD IMPLANTATION; 2ND TO CHF AND NON-ISCHEMIC CARDIOMYOPATHY:(12/02/2006) ECHO HYPOKINESIS POSTERIOR WALL, EF 35%, MILD AR:(11/27/2006) EGD REFLUX  ERYTHEM. DUOD. (DR ELLIOTT) (10/21/2008) COLONOSCOPY = ABORTED: DIVERTICS, INT HEMMS (DR ELLIOTT) (10/21/2008) [facility] AFLUTTER CARDIOVERTED 02/22/2009 (01/22/2010 13:13) Added new observation of PAST MED HX: NICM (ICD 425.9) with EF 35%, ICD and pacemaker in place (ICD V45.02) - (06/2004) Atrial flutter on coumadin HLD (ICD-272.4) - (05/2000) COPD via CXR (ICD-496) - (07/2003) LBBB (ICD-426.3) GERD (ICD-530.81) - via egd with h.hernia, acute gastritis, duodenitis, s/p stricture dilation (12/2004) (Dr. Markham Jordan) GAS/BLOATING (ICD-787.3) PORPHYRIA (ICD-277.1) IBS (ICD-564.1) BPH (ICD-600.00) - (1995) DIVERTICULITIS, HX OF with severe diverticulosis on colonsocpy (ICD-V12.79) - (01/1998) ALLERGIC RHINITIS (ICD-477.9) - and asthma per Dr. [place] Callas Admission to [facility] with GI bleed thought 2/2 int hemorrhoids (12/2009) (01/22/2010  13:13)        Past History:  Past Medical History: NICM (ICD 425.9) with EF 35%, ICD and pacemaker in place (ICD V45.02) - (06/2004) Atrial flutter on coumadin HLD (ICD-272.4) - (05/2000) COPD via CXR (ICD-496) - (07/2003) LBBB (ICD-426.3) GERD (ICD-530.81) - via egd with h.hernia, acute gastritis, duodenitis, s/p stricture dilation (12/2004) (Dr. Markham Jordan) GAS/BLOATING (ICD-787.3) PORPHYRIA (ICD-277.1) IBS (ICD-564.1) BPH (ICD-600.00) - (1995) DIVERTICULITIS, HX OF with severe diverticulosis on colonsocpy (ICD-V12.79) - (01/1998) ALLERGIC RHINITIS (ICD-477.9) - and asthma per Dr. [place] Callas Admission to [facility] with GI bleed thought 2/2 int hemorrhoids (12/2009)  Past Surgical History: PENILE PROSTHESIS ([place]) 2007 CHOLEYCYSTECTOMY GOITER REMOVAL NASAL SURGERY(:1971) COLONOSCOPY;  DIVERTIC,SPLENIC, HEPATIC FLEURE ONLY:(01/1998) EGD STRICTURE,SLIDING H/H. ; GERD:(01/1998)  DILITATION:(02/18/1998) EGD STRICTURE ; GASTRITIS, H.H. , GERD NO DILITATION (Dr. Marina Goodell) (04/08/2001) HERNIA REPAIR:(DR. Daphine Deutscher) (01/30/2002) PLUMONARY EVALUATION ([facility]) CHRONIC CONGESTIVE  SYMPTOMS:(04/2002) CATHERIZATION, SEVERE NONISCHEMIA CARDIOMYOPATHY, EF 25 TO 30 %:(06/22/2004) ECHO EF25-30% ,MOD LVH; LA SEVERE DILATATION; MILD A.R.; IRTR:(07/13/2004) EGD, STRICTURE; GASTRITIS, DUODENITIS, GERD:(12/22/2004) BIVENTRICULAR ICD IMPLANTATION; 2ND TO CHF AND NON-ISCHEMIC CARDIOMYOPATHY:(12/02/2006) ECHO HYPOKINESIS POSTERIOR WALL, EF 35%, MILD AR:(11/27/2006) EGD REFLUX  ERYTHEM. DUOD. (DR ELLIOTT) (10/21/2008) COLONOSCOPY = ABORTED: DIVERTICS, INT HEMMS (DR ELLIOTT) (10/21/2008) [facility] AFLUTTER CARDIOVERTED 02/22/2009   Allergies: 1)  ! Penicillin 2)  ! Terramycin 3)  ! Prilosec 4)  !  Biaxin 5)  ! Pepcid 6)  ! * Zegerid 7)  ! * Ranitidine   Family History: Father: D 83yo, CAD, HTN Mother: D 31yo CATH @84yo  nl, no blockage, CHF SISTER A 76  DYSPHAGIA STOMACH PROBS SISTER A 63 STOMACH PROBS SISTER A  62 STOMACH PROBS BREAST/OVARIAN/UTERINE CANCER:+ COUISINS, +AUNT ETOH ABUSE: +M UNCLES  No DM, CVA, otherCA  Social History: Tobacco Use - No.  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

## 2010-05-09 NOTE — Medication Information (Signed)
Summary: CCR/AMD  Anticoagulant Therapy  Managed by: Cloyde Reams, RN, BSN Referring MD: Tonette Lederer, Fayrene Fearing PCP: Shaune Leeks MD Supervising MD: Mariah Milling Indication 1: Atrial Fibrillation [place] Site: [place] INR POC 2.1 INR RANGE 2.0-3.0  Dietary changes: no    Health status changes: no    Bleeding/hemorrhagic complications: no    Recent/future hospitalizations: no    Any changes in medication regimen? no    Recent/future dental: no  Any missed doses?: no       Is patient compliant with meds? yes       Allergies: 1)  ! Penicillin 2)  ! Terramycin 3)  ! Prilosec 4)  ! Biaxin 5)  ! Pepcid 6)  ! * Zegerid 7)  ! * Ranitidine  Anticoagulation Management History:      The patient is taking warfarin and comes in today for a routine follow up visit.  Positive risk factors for bleeding include an age of 75 years or older.  The bleeding index is 'intermediate risk'.  Positive CHADS2 values include History of CHF and Age > 45 years old.  His last INR was 2.25.  Anticoagulation responsible provider: [place].  INR POC: 2.1.  Cuvette Lot#: [id number].  Exp: 12/2010.    Anticoagulation Management Assessment/Plan:      The patient's current anticoagulation dose is Warfarin sodium 5 mg tabs: Use as directed by Anticoagulation Clinic.  The target INR is 2.0-3.0.  The next INR is due 11/02/2009.  Anticoagulation instructions were given to patient.  Results were reviewed/authorized by Cloyde Reams, RN, BSN.  He was notified by Cloyde Reams RN.         Prior Anticoagulation Instructions: INR 1.8  Take 1 tablet daily except 1.5 tablets Monday, Wednesday and Friday.  Recheck in 2 weeks.  Current Anticoagulation Instructions: INR 2.1  Continue on same dosage 1 tablet daily except 1.5 tablets on Mondays, Wednesdays, and Fridays.  Recheck in 4 weeks.

## 2010-05-09 NOTE — Letter (Signed)
Summary: Dr.Francisco [facility] Pain Mgmt,Initial Pt Evaluation  Dr.Francisco [facility] Pain Mgmt,Initial Pt Evaluation   Imported By: Beau Fanny 06/02/2009 10:13:33  _____________________________________________________________________  External Attachment:    Type:   Image     Comment:   External Document

## 2010-05-09 NOTE — Letter (Signed)
Summary: Remote Device Check  [employer], Main Office  [address]   [place], [place] [address]   Phone: [phone]  Fax: [phone]     October 11, 2008 MRN: [id number]   [name] [address] [place], [place]  [address]   Dear [name],   Your remote transmission was recieved and reviewed by your physician.  All diagnostics were within normal limits for you.    ___X___Your next office visit is scheduled for: September 2010 with Dr. Ladona Ridgel. Please call our office to schedule an appointment.    Sincerely,  Proofreader

## 2010-05-09 NOTE — Procedures (Signed)
Summary: [facility]/Colonoscopy/Dr. Mechele Collin  [facility]/Colonoscopy/Dr. Mechele Collin   Imported By: Eleonore Chiquito 10/27/2008 10:49:34  _____________________________________________________________________  External Attachment:    Type:   Image     Comment:   External Document  Appended Document: [facility]/Colonoscopy/Dr. Mechele Collin     Clinical Lists Changes  Observations: Added new observation of PAST SURG HX: PENILE PROSTHESIS CHOLEYCYSTECTOMY GOITER REMOVAL CATH CLEART MID 1990s NASAL SURGERY(:1971) COLONOSCOPY;  DIVERTIC,SPLENIC, HEPATIC FLEURE ONLY:(01/1998) EGD STRICTURE,SLIDING H/H. ; GERD:(01/1998) EGD, DILITATION:(02/18/1998) EGD STRICTURE ; GASTRITIS, H.H. , GERD NO DILITATION TODAY DR. PERRY:(04/08/2001) HERNIA REPAIR:(DR. 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 DUOD. (DR ELLIOTT) (10/21/2008) COLONOSCOPY   ABORTED   DIVERTICS INT HEMMS (DR ELLIOTT) (10/21/2008)  (10/27/2008 13:36)       Past Surgical History:    PENILE PROSTHESIS    CHOLEYCYSTECTOMY    GOITER REMOVAL    CATH CLEART MID 1990s    NASAL SURGERY(:1971)    COLONOSCOPY;  DIVERTIC,SPLENIC, HEPATIC FLEURE ONLY:(01/1998)    EGD STRICTURE,SLIDING H/H. ; GERD:(01/1998)    EGD, DILITATION:(02/18/1998)    EGD STRICTURE ; GASTRITIS, H.H. , GERD NO DILITATION TODAY DR. PERRY:(04/08/2001)    HERNIA REPAIR:(DR. MARTIN):(01/30/2002)    PLUMONARY EVALUATION ([facility]) CHRONIC CONGESTIVE  SYMPTOMS:(04/2002)    CATHERIZATION, SEVERE NONISCHEMIA CARDIOMYOPATHY,  EF 25 TO 30 %:(06/22/2004)    ECHO EF25-30% ,MOD LVH; LA SEVERE DILATATION; MILD A.R.; IRTR:(07/13/2004)    EGD, STRICTURE; GASTRITIS, DUODENITIS, GERD:(12/22/2004)    BIVENTRICULAR ICD IMPLANTATION; 2ND TO CHF AND NON-ISCHEMICA CARDIOMYOPATHY:(12/02/2006)    INFLATABLE PENILE PROTHESIS (DR. OTELIN):05/2005)    ECHO HYPOKINESIS POSTERIOR WALL, EF 35%, MILD AR:(11/27/2006)    EGD REFLUX  ERYTHEM. DUOD. (DR ELLIOTT) (10/21/2008)    COLONOSCOPY   ABORTED   DIVERTICS INT HEMMS (DR ELLIOTT) (10/21/2008)

## 2010-05-09 NOTE — Cardiovascular Report (Signed)
Summary: Office Visit Remote   Office Visit Remote   Imported By: Roderic Ovens 10/27/2009 16:07:44  _____________________________________________________________________  External Attachment:    Type:   Image     Comment:   External Document

## 2010-05-09 NOTE — Letter (Signed)
Summary: [name] letter  [name] at [facility]  [address] [place], [place] [address]   Phone: [phone]  Fax: [phone]       11/10/2009 MRN: [id number]  [name] [address] [place], [place]  [address]  Dear [name] [place] [place] announce the retirement of Arta Silence, M.D., from full-time practice at the [facility] office effective October 06, 2009 and his plans of returning part-time.  It is important to Dr. Hetty Ely and to our practice that you understand that [facility] has seven physicians in our office for your health care needs.  We will continue to offer the same exceptional care that you have today.    Dr. Hetty Ely has spoken to many of you about his plans for retirement and returning part-time in the fall.   We will continue to work with you through the transition to schedule appointments for you in the office and meet the high standards that [place] is committed to.   Again, it is with great pleasure that we share the news that Dr. Hetty Ely will return to [facility] at [facility] in October of 2011 with a reduced schedule.    If you have any questions, or would like to request an appointment with one of our physicians, please call us at [phone] and press the option for Scheduling an appointment.  We take pleasure in providing you with excellent patient care and look forward to seeing you at your next office visit.  Our [facility] Physicians are:  Tillman Abide, M.D. Laurita Quint, M.D. Roxy Manns, M.D. Kerby Nora, M.D. Hannah Beat, M.D. Ruthe Mannan, M.D. We proudly welcomed Raechel Ache, M.D. and Eustaquio Boyden, M.D. to the practice in July/August 2011.  Sincerely,  [facility]

## 2010-05-09 NOTE — Letter (Signed)
Summary: [facility]/Gastroenterology/K. Arvilla Market RN  [facility]/Gastroenterology/K. Arvilla Market RN   Imported By: Eleonore Chiquito 11/01/2008 16:52:16  _____________________________________________________________________  External Attachment:    Type:   Image     Comment:   External Document

## 2010-05-09 NOTE — Medication Information (Signed)
Summary: Coumadin Clinic  Anticoagulant Therapy  Managed by: Inactive Referring MD: Rosebud Poles PCP: Shaune Leeks MD Supervising MD: Eden Emms MD, Theron Arista Indication 1: Atrial Fibrillation [place] Site: [place] INR POC 2.0 INR RANGE 2.0-3.0  Dietary changes: no    Health status changes: yes       Details: had some vomiting and diarrhea for several hours yesterday  Bleeding/hemorrhagic complications: yes       Details: pt reports rectal bleeding; says bright red blood.  Seeing Dr. Antoine Poche today and he is stopping Coumadin for now.   Recent/future hospitalizations: no    Any changes in medication regimen? no    Recent/future dental: no  Any missed doses?: no       Is patient compliant with meds? yes       Allergies: 1)  ! Penicillin 2)  ! Terramycin 3)  ! Prilosec 4)  ! Biaxin 5)  ! Pepcid 6)  ! * Zegerid 7)  ! * Ranitidine  Anticoagulation Management History:      The patient is taking warfarin and comes in today for a routine follow up visit.  Positive risk factors for bleeding include an age of 75 years or older.  The bleeding index is 'intermediate risk'.  Positive CHADS2 values include History of CHF and Age > 71 years old.  His last INR was 2.25.  Anticoagulation responsible provider: Eden Emms MD, Theron Arista.  INR POC: 2.0.  Exp: 01/2011.    Anticoagulation Management Assessment/Plan:      The patient's current anticoagulation dose is Warfarin sodium 5 mg tabs: Use as directed by Anticoagulation Clinic.  The target INR is 2.0-3.0.  The next INR is due 01/11/2010.  Anticoagulation instructions were given to patient.  Results were reviewed/authorized by Inactive.  He was notified by Weston Brass PharmD.         Prior Anticoagulation Instructions: INR 2.8  Continue on same dosage 1 tablet daily except 1.5 tablets on Mondays, Wednesdays, and Fridays.  Recheck in 4 weeks.    Current Anticoagulation Instructions: INR 2.0  Stop Coumadin.  Pt to call if Coumadin  restarted.   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 [place] Callas PAIN IN JOINT, PELVIS/THIGH (RIGHT BUTTOCK) (ICD-719.45) HEEL PAIN, RIGHT (ICD-729.5) SPECIAL SCREENING MALIG NEOPLASMS OTHER SITES (ICD-V76.49) Admission to [facility] with GI bleed 12/2009 (01/09/2010 20:45)       Past History:  Past Medical History: LBBB (ICD-426.3) ICD - IN SITU (ICD-V45.02) - EF 35% - (06/2004) CARDIOMYOPATHY, SECONDARY (ICD-425.9) - (06/2004) HYPERLIPIDEMIA (ICD-272.4) - (05/2000) COPD  VIA CXR (ICD-496) - :(07/2003) GERD (ICD-530.81) - via egd withh.h., acute gastritis, duodenitis:(12/2004) GAS/BLOATING (ICD-787.3) GOITER (ICD-240.9) PORPHYRIA (ICD-277.1) IBS (ICD-564.1) BENIGN PROSTATIC HYPERTROPHY (ICD-600.00) - (1995) DIVERTICULITIS, HX OF (ICD-V12.79) - , hx of:(01/1998) ALLERGIC RHINITIS (ICD-477.9) - and asthma per Dr. [place] Callas PAIN IN JOINT, PELVIS/THIGH (RIGHT BUTTOCK) (ICD-719.45) HEEL PAIN, RIGHT (ICD-729.5) SPECIAL SCREENING MALIG NEOPLASMS OTHER SITES (ICD-V76.49) Admission to [facility] with GI bleed 12/2009

## 2010-05-09 NOTE — Medication Information (Signed)
Summary: [facility]in Clinic  Anticoagulant Therapy  Managed by: Charlena Cross, RN, BSN Referring MD: Tonette Lederer, Fayrene Fearing PCP: Shaune Leeks MD Supervising MD: Mariah Milling Indication 1: Atrial Fibrillation [place] Site: [place] INR POC 2.4 INR RANGE 2.0-3.0  Dietary changes: no    Health status changes: no    Bleeding/hemorrhagic complications: no    Recent/future hospitalizations: no    Any changes in medication regimen? no    Recent/future dental: no  Any missed doses?: no       Is patient compliant with meds? yes       Allergies: 1)  ! Penicillin 2)  ! Terramycin 3)  ! Prilosec 4)  ! Biaxin 5)  ! Pepcid 6)  ! * Zegerid 7)  ! * Ranitidine  Anticoagulation Management History:      The patient is taking warfarin and comes in today for a routine follow up visit.  Positive risk factors for bleeding include an age of 42 years or older.  The bleeding index is 'intermediate risk'.  Positive CHADS2 values include History of CHF and Age > 39 years old.  His last INR was 2.25.  Anticoagulation responsible provider: Viveca Beckstrom.  INR POC: 2.4.    Anticoagulation Management Assessment/Plan:      The patient's current anticoagulation dose is Warfarin sodium 5 mg tabs: Use as directed by Anticoagulation Clinic.  The target INR is 2.0-3.0.  The next INR is due 07/04/2009.  Anticoagulation instructions were given to patient.  Results were reviewed/authorized by Charlena Cross, RN, BSN.  He was notified by Charlena Cross, RN, BSN.         Prior Anticoagulation Instructions: coumadin 5 mg daily   Current Anticoagulation Instructions: The patient is to continue with the same dose of coumadin.  This dosage includes: coumadin 5 mg daily

## 2010-05-09 NOTE — Letter (Signed)
Summary[facility]ist, [facility]   [facility]   Imported By: Roderic Ovens 01/18/2010 10:33:35  _____________________________________________________________________  External Attachment:    Type:   Image     Comment:   External Document

## 2010-05-09 NOTE — Letter (Signed)
Summary: Remote Device Check  [employer], Main Office  [address]   [place], [place] [address]   Phone: [phone]  Fax: [phone]     October 27, 2009 MRN: [id number]   [name] [address] [place], [place]  [address]   Dear [name],   Your remote transmission was recieved and reviewed by your physician.  All diagnostics were within normal limits for you.  __X___Your next transmission is scheduled for: 12-15-2009.  Please transmit at any time this day.  If you have a wireless device your transmission will be sent automatically.   Sincerely,  Vella Kohler

## 2010-05-09 NOTE — Assessment & Plan Note (Signed)
Summary: CPx//kad   Vital Signs:  Patient profile:   77 year old male Weight:      148.25 pounds Temp:     98.4 degrees F oral Pulse rate:   68 / minute Pulse rhythm:   regular BP sitting:   126 / 70  (left arm) Cuff size:   regular  Vitals Entered By: Selena Batten Dance CMA (AAMA) (February 16, 2010 8:30 AM) CC: CPx   History of Present Illness: CC: CPE  77 yo with h/o aflutter on coumadin, NICM with defibrillator and pacemaker, COPD presents for CPE.  Recent admission 9/27-28 for lower GI bleed thought to be 2/2 divertics or internal hemorrhoids at [facility].  Yesterday had episode of choking on food, unable to breath and almost passed out.  h/o esophageal strictures s/p dilation.  Saw GI Dr. Woody Seller yesterday who rec rpt dilation, holding coumadin, hopeful to do tomorrow.  Currently on clears.  recently had ingrown toenail w/ pus, self treated with razor and epsom salt soaks.  Preventative: Recent colonoscopy this year.  reviewing records - aborted 2/2 significant diverticulosis Woody Seller).  h/o GERD with esophagitis on EGD. prostate - gets checked by Dr. Vernie Ammons every year. Declines flu shot (hasn't been sick in 30 years). Tetanus shot today. Declines pneumonia shot.  Nocturia every 2-3 hours.  Breathing ok, no chest pain, no blood in stool.  see ROS Hearing and vision fine.  Walking fine.  stays active chopping wood for heat.    Allergies: 1)  ! Penicillin 2)  ! Terramycin 3)  ! Prilosec 4)  ! Biaxin 5)  ! Pepcid 6)  ! * Zegerid 7)  ! * Ranitidine  Past History:  Past Medical History: Last updated: 01/22/2010 NICM (ICD 425.9) with EF 35%, ICD and pacemaker in place (ICD V45.02) - (06/2004) Atrial flutter on coumadin HLD (ICD-272.4) - (05/2000) COPD via CXR (ICD-496) - (07/2003) LBBB (ICD-426.3) GERD (ICD-530.81) - via egd with h.hernia, acute gastritis, duodenitis, s/p stricture dilation (12/2004) (Dr. Markham Jordan) GAS/BLOATING (ICD-787.3) PORPHYRIA (ICD-277.1) IBS  (ICD-564.1) BPH (ICD-600.00) - (1995) DIVERTICULITIS, HX OF with severe diverticulosis on colonsocpy (ICD-V12.79) - (01/1998) ALLERGIC RHINITIS (ICD-477.9) - and asthma per Dr.  Callas Admission to [facility] with GI bleed thought 2/2 int hemorrhoids (12/2009)  Past Surgical History: Last updated: 01/22/2010 PENILE PROSTHESIS ([place]) 2007 CHOLEYCYSTECTOMY GOITER REMOVAL NASAL SURGERY(:1971) COLONOSCOPY;  DIVERTIC,SPLENIC, HEPATIC FLEURE ONLY:(01/1998) EGD STRICTURE,SLIDING H/H. ; GERD:(01/1998)  DILITATION:(02/18/1998) EGD STRICTURE ; GASTRITIS, H.H. , GERD NO DILITATION (Dr. Marina Goodell) (04/08/2001) HERNIA REPAIR:(DR. Daphine Deutscher) (01/30/2002) PLUMONARY EVALUATION ([facility]) CHRONIC CONGESTIVE  SYMPTOMS:(04/2002) CATHERIZATION, SEVERE NONISCHEMIA CARDIOMYOPATHY, EF 25 TO 30 %:(06/22/2004) ECHO EF25-30% ,MOD LVH; LA SEVERE DILATATION; MILD A.R.; IRTR:(07/13/2004) EGD, STRICTURE; GASTRITIS, DUODENITIS, GERD:(12/22/2004) BIVENTRICULAR ICD IMPLANTATION; 2ND TO CHF AND NON-ISCHEMIC CARDIOMYOPATHY:(12/02/2006) ECHO HYPOKINESIS POSTERIOR WALL, EF 35%, MILD AR:(11/27/2006) EGD REFLUX  ERYTHEM. DUOD. (DR ELLIOTT) (10/21/2008) COLONOSCOPY = ABORTED: DIVERTICS, INT HEMMS (DR ELLIOTT) (10/21/2008) [facility] AFLUTTER CARDIOVERTED 02/22/2009  Social History: Tobacco Use - No.  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 Gavin Potters  Review of Systems  The patient denies anorexia, fever, weight loss, weight gain, vision loss, decreased hearing, hoarseness, chest pain, syncope, dyspnea on exertion, peripheral edema, prolonged cough, headaches, hemoptysis, abdominal pain, melena, hematochezia, severe indigestion/heartburn, hematuria, incontinence, genital sores, muscle weakness, transient blindness, difficulty walking, and depression.    Physical Exam  General:   Well-developed,well-nourished,in no  acute distress; alert,appropriate and cooperative throughout examination. Head:  Normocephalic and atraumatic without obvious abnormalities. No apparent alopecia or balding. Sinuses nontender. Eyes:  Conjunctiva clear bilaterally.  Ears:  TM clear bilaterally Nose:  External nasal examination shows no deformity or inflammation. Nasal mucosa are pink and moist without lesions or exudates. Mouth:  Oral mucosa and oropharynx without lesions or exudates.  Teeth in good repair. Neck:  No deformities, masses, or tenderness noted. Lungs:  Normal respiratory effort, chest expands symmetrically. Lungs are clear to auscultation, no crackles or wheezes. Heart:  slight irregular rhythm.  S1 and S2 normal without gallop, murmur, click, rub or other extra sounds. Abdomen:  Bowel sounds positive,abdomen soft and non-tender without masses, organomegaly or hernias noted. Msk:  No deformity or scoliosis noted of thoracic or lumbar spine.   Pulses:  2+ rad pulses Extremities:  no pedal edema.  L big toe medial slight induration, no fluctuance, no pus.  + dry blood. Skin:  Intact without suspicious lesions or rashes    Impression & Recommendations:  Problem # 1:  ESOPHAGEAL STRICTURE (ICD-530.3) holding coumadin.  to undergo dilation hopeful for tomorrow by Dr. Woody Seller Gavin Potters). clears for now.  Problem # 2:  COPD  VIA CXR (ICD-496) stable. His updated medication list for this problem includes:    Singulair 10 Mg Tabs (Montelukast sodium) .Marland Kitchen... 1 tab every day as needed    Advair Diskus 100-50 Mcg/dose Aepb (Fluticasone-salmeterol) .Marland Kitchen... 1 inhalation twice a day by mouth  Problem # 3:  HYPERLIPIDEMIA (ICD-272.4) stable off meds.  rec cut back on red meat, increase fish, watch whole dairy products. Labs Reviewed: SGOT: 19 (02/09/2010)   SGPT: 16 (02/09/2010)   HDL:42.50 (02/09/2010), 43.10 (12/03/2008)  LDL:119 (02/09/2010), 89 (16/01/9603)  Chol:191 (02/09/2010), 149  (12/03/2008)  Trig:148.0 (02/09/2010), 83.0 (12/03/2008)  Problem # 4:  ATRIAL FLUTTER (ICD-427.32) stable per cards.  to see Dr. Ladona Ridgel. His updated medication list for this problem includes:    Warfarin Sodium 5 Mg Tabs (Warfarin sodium) ..... Use as directed by anticoagulation clinic    Carvedilol 6.25 Mg Tabs (Carvedilol) .Marland Kitchen... 1 tab twice a day  Problem # 5:  CONGESTIVE HEART FAILURE (ICD-428.0) stable on meds. His updated medication list for this problem includes:    Warfarin Sodium 5 Mg Tabs (Warfarin sodium) ..... Use as directed by anticoagulation clinic    Enalapril Maleate 5 Mg Tabs (Enalapril maleate) .Marland Kitchen... 1 by mouth two times a day    Carvedilol 6.25 Mg Tabs (Carvedilol) .Marland Kitchen... 1 tab twice a day    Furosemide 20 Mg Tabs (Furosemide) .Marland Kitchen... 1/2 tablet every day  Echocardiogram: -  There is signifcant hypokinesis of the posterior wall. The EF         seems to have improved since report of prior study. The EF         now is at least 35%. The left ventricle was mildly dilated.         Overall left ventricular systolic function was moderately         decreased. Left ventricular ejection fraction was estimated         to be 35 %.   -  There was mild aortic valvular regurgitation. The mean         transaortic valve gradient was 3.4 mmHg. Estimated aortic         valve area (by Vmax) was 3.34 cm^2.   -  Mitral valve area by pressure half-time was 3.03 cm^2.   -  The left atrium was mildly dilated.   -  There was the appearance of a catheter or pacing wire in the         right ventricle. (11/27/2006)  Problem # 6:  HEALTH MAINTENANCE EXAM (ICD-V70.0) Reviewed preventive care protocols, scheduled due services, and updated immunizations. refuses PNA shot, flu shot.  received tdap today.  Problem # 7:  SPECIAL SCREENING MALIGNANT NEOPLASM OF PROSTATE (ICD-V76.44) PSA reassuring.  receives DRE by Uro yearly.  Complete Medication List: 1)  Warfarin Sodium 5 Mg Tabs (Warfarin  sodium) .... Use as directed by anticoagulation clinic 2)  Enalapril Maleate 5 Mg Tabs (Enalapril maleate) .Marland Kitchen.. 1 by mouth two times a day 3)  Carvedilol 6.25 Mg Tabs (Carvedilol) .Marland Kitchen.. 1 tab twice a day 4)  Furosemide 20 Mg Tabs (Furosemide) .... 1/2 tablet every day 5)  Valium 5 Mg Tabs (Diazepam) .... 1/4 tab at bedtime as needed 6)  Carafate 1 Gm Tabs (Sucralfate) .... 1/2 tablet by mouth as needed 7)  Nasarel 29 Mcg/act Soln (Flunisolide) .Marland Kitchen.. 1 spray every day 8)  Singulair 10 Mg Tabs (Montelukast sodium) .Marland Kitchen.. 1 tab every day as needed 9)  Astepro 0.15 % Soln (Azelastine hcl) .Marland Kitchen.. 1 spray each nostril as needed 10)  Advair Diskus 100-50 Mcg/dose Aepb (Fluticasone-salmeterol) .Marland Kitchen.. 1 inhalation twice a day by mouth 11)  Mucus Relief 400 Mg Tabs (Guaifenesin) .Marland Kitchen.. 1 tab every day as needed 12)  Equate Allergy Relief 10mg   .... 1 tab every day (claritin generic) 13)  Docusate Sodium 100 Mg Caps (Docusate sodium) .... Take one pill daily as needed constipation, hold for diarrhea 14)  Warfarin Sodium 5 Mg Tabs (Warfarin sodium) .... Use as directed by anticoagulation clinic  Other Orders: Tdap => 77yrs IM ([address]109) Admin 1st Vaccine (60454)  Patient Instructions: 1)  tetanus shot today 2)  Blood work provided. 3)  Try valium for muscle pain.  Let us konw how that goes. 4)  Good to see you today, call clinic with quesitons. Prescriptions: VALIUM 5 MG  TABS (DIAZEPAM) 1/4 tab at bedtime as needed  #50 x 0   Entered and Authorized by:   Eustaquio Boyden  MD   Signed by:   Eustaquio Boyden  MD on 02/16/2010   Method used:   Print then Give to Patient   RxID:   [id number]    Orders Added: 1)  Est. Patient Level IV [30865] 2)  Tdap => 64yrs IM [78469] 3)  Admin 1st Vaccine [62952]   Immunizations Administered:  Tetanus Vaccine:    Vaccine Type: Tdap    Site: left deltoid    Mfr: GlaxoSmithKline    Dose: 0.5 ml    Route: IM    Given by: Selena Batten Dance CMA (AAMA)    Exp. Date:  01/27/2012    Lot #: [id number]    VIS given: 02/25/08 version given February 16, 2010.   Immunizations Administered:  Tetanus Vaccine:    Vaccine Type: Tdap    Site: left deltoid    Mfr: GlaxoSmithKline    Dose: 0.5 ml    Route: IM    Given by: Selena Batten Dance CMA (AAMA)    Exp. Date: 01/27/2012    Lot #: [id number]    VIS given: 02/25/08 version given February 16, 2010.   Prevention & Chronic Care Immunizations   Influenza vaccine: Not documented    Tetanus booster: 02/16/2010: Tdap   Tetanus booster due: 11/07/2005    Pneumococcal vaccine: Not documented    H.  zoster vaccine: 05/31/2006: Zostavax  Colorectal Screening   Hemoccult: negative  (10/31/2007)    Colonoscopy: Done  (01/07/1998)  Other Screening   PSA: 2.25  (02/09/2010)   PSA due due: 02/10/2011   Smoking status: never  (12/08/2008)  Lipids   Total Cholesterol: 191  (02/09/2010)   LDL: 119  (02/09/2010)   LDL Direct: Not documented   HDL: 42.50  (02/09/2010)   Triglycerides: 148.0  (02/09/2010)    SGOT (AST): 19  (02/09/2010)   SGPT (ALT): 16  (02/09/2010)   Alkaline phosphatase: 58  (02/09/2010)   Total bilirubin: 1.0  (02/09/2010)  Self-Management Support :    Lipid self-management support: Not documented    Current Allergies (reviewed today): ! PENICILLIN ! TERRAMYCIN ! PRILOSEC ! BIAXIN ! PEPCID ! * ZEGERID ! * RANITIDINE
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
## 2010-05-09 NOTE — Medication Information (Signed)
Summary: CCR/AMD  Anticoagulant Therapy  Managed by: Charlena Cross, RN, BSN Referring MD: Tonette Lederer, Fayrene Fearing PCP: Shaune Leeks MD Supervising MD: Graciela Husbands MD, Viviann Spare Indication 1: Atrial Fibrillation [place] Site: [place] INR POC 1.5  Dietary changes: no    Health status changes: no    Bleeding/hemorrhagic complications: no    Recent/future hospitalizations: no    Any changes in medication regimen? no    Recent/future dental: no  Any missed doses?: no       Is patient compliant with meds? yes       Allergies: 1)  ! Penicillin 2)  ! Terramycin 3)  ! Prilosec 4)  ! Biaxin 5)  ! Pepcid 6)  ! * Zegerid 7)  ! * Ranitidine  Anticoagulation Management History:      The patient is taking warfarin and comes in today for a routine follow up visit.  Positive risk factors for bleeding include an age of 57 years or older.  The bleeding index is 'intermediate risk'.  Positive CHADS2 values include History of CHF and Age > 75 years old.  Anticoagulation responsible Bernedette Auston: Graciela Husbands MD, Viviann Spare.  INR POC: 1.5.    Anticoagulation Management Assessment/Plan:      The patient's current anticoagulation dose is Warfarin sodium 5 mg tabs: Use as directed by Anticoagulation Clinic.  The target INR is 2.0-3.0.  The next INR is due 01/24/2009.  Anticoagulation instructions were given to patient.  Results were reviewed/authorized by Charlena Cross, RN, BSN.  He was notified by Charlena Cross, RN, BSN.         Prior Anticoagulation Instructions: Sun, Mon, Wed, Fri 2.5mg   tablet Tue, Thurs, Sat 5mg   tablet  Current Anticoagulation Instructions: coumadin 10mg  today (2 pills) then coumadin 5mg  every daily except 2.5 mg on Tues and Sat.

## 2010-05-09 NOTE — Miscellaneous (Signed)
Summary: DEVICE PRELOAD  Clinical Lists Changes  Observations: Added new observation of ICD INDICATN: NICM, CHF (06/17/2008 9:34) Added new observation of ICDLEADSTAT3: active (06/17/2008 9:34) Added new observation of ICDLEADSER3: [id number] V (06/17/2008 9:34) Added new observation of ICDLEADMOD3: 4194  (06/17/2008 9:34) Added new observation of ICDLEADDOI3: 08/31/2005  (06/17/2008 9:34) Added new observation of ICDLEADLOC3: LV  (06/17/2008 9:34) Added new observation of ICDLEADSTAT2: active  (06/17/2008 9:34) Added new observation of ICDLEADSER2: [id number]  (06/17/2008 9:34) Added new observation of ICDLEADMOD2: 7829  (06/17/2008 9:34) Added new observation of ICDLEADDOI2: 08/31/2005  (06/17/2008 9:34) Added new observation of ICDLEADLOC2: RV  (06/17/2008 9:34) Added new observation of ICDLEADSTAT1: active  (06/17/2008 9:34) Added new observation of ICDLEADSER1: [id number]  (06/17/2008 9:34) Added new observation of ICDLEADMOD1: 5076  (06/17/2008 9:34) Added new observation of ICDLEADDOI1: 08/31/2005  (06/17/2008 9:34) Added new observation of ICDLEADLOC1: RA  (06/17/2008 9:34) Added new observation of ICD IMP MD: Lewayne Bunting, MD  (06/17/2008 9:34) Added new observation of ICD IMPL DTE: 08/31/2005  (06/17/2008 9:34) Added new observation of ICD SERL#: [id number]  (06/17/2008 9:34) Added new observation of ICD MODL#: 7299  (06/17/2008 9:34) Added new observation of ICDMANUFACTR: [employer]  (06/17/2008 9:34) Added new observation of CARDIO MD: Lewayne Bunting, MD  (06/17/2008 9:34)      ICD Specifications Following MD:  Lewayne Bunting, MD     ICD Vendor:  [employer]     ICD Model Number:  7299     ICD Serial Number:  [id number] ICD DOI:  08/31/2005     ICD Implanting MD:  Lewayne Bunting, MD  Lead 1:    Location: RA     DOI: 08/31/2005     Model #: [id number]     Serial #: [id number]     Status: active Lead 2:    Location: RV     DOI: 08/31/2005     Model #: [id number]     Serial #: [id number]      Status: active Lead 3:    Location: LV     DOI: 08/31/2005     Model #: [id number]     Serial #: [id number]     Status: active  Indications::  NICM, CHF

## 2010-05-09 NOTE — Medication Information (Signed)
Summary: CCR/AMD  Anticoagulant Therapy  Managed by: Charlena Cross, RN, BSN Referring MD: Tora Kindred MD: Graciela Husbands MD, Viviann Spare Indication 1: Atrial Fibrillation [place] Site: [place] INR POC 2.2  Dietary changes: no    Health status changes: yes       Details: headache and joint aches since starting coumadin  Bleeding/hemorrhagic complications: no    Recent/future hospitalizations: no    Any changes in medication regimen? no    Recent/future dental: no  Any missed doses?: yes     Details: took 2.5mg  on Sat per pharmacist   Is patient compliant with meds? yes       Allergies: 1)  ! Penicillin 2)  ! Terramycin 3)  ! Prilosec 4)  ! Biaxin 5)  ! Pepcid 6)  ! * Zegerid 7)  ! * Ranitidine  Anticoagulation Management History:      The patient is taking warfarin and comes in today for a routine follow up visit.  Positive risk factors for bleeding include an age of 32 years or older.  The bleeding index is 'intermediate risk'.  Positive CHADS2 values include History of CHF and Age > 53 years old.  Anticoagulation responsible provider: Graciela Husbands MD, Viviann Spare.  INR POC: 2.2.    Anticoagulation Management Assessment/Plan:      The patient's current anticoagulation dose is Warfarin sodium 5 mg tabs: Use as directed by Anticoagulation Clinic.  The next INR is due 01/03/2009.  Anticoagulation instructions were given to patient.  Results were reviewed/authorized by Charlena Cross, RN, BSN.  He was notified by Charlena Cross, RN, BSN.         Current Anticoagulation Instructions: The patient is to continue with the same dose of coumadin.  This dosage includes: coumadin 5mg  daily

## 2010-05-09 NOTE — Medication Information (Signed)
Summary: ccr  Anticoagulant Therapy  Managed by: Charlena Cross, RN, BSN Referring MD: Tonette Lederer, Fayrene Fearing PCP: Shaune Leeks MD Supervising MD: Gala Romney MD, [name] Indication 1: Atrial Fibrillation [place] Site: [place] INR POC 1.7  Dietary changes: no    Health status changes: no    Bleeding/hemorrhagic complications: no    Recent/future hospitalizations: no    Any changes in medication regimen? no    Recent/future dental: no  Any missed doses?: no       Is patient compliant with meds? yes       Allergies: 1)  ! Penicillin 2)  ! Terramycin 3)  ! Prilosec 4)  ! Biaxin 5)  ! Pepcid 6)  ! * Zegerid 7)  ! * Ranitidine  Anticoagulation Management History:      The patient is taking warfarin and comes in today for a routine follow up visit.  Positive risk factors for bleeding include an age of 79 years or older.  The bleeding index is 'intermediate risk'.  Positive CHADS2 values include History of CHF and Age > 58 years old.  His last INR was 2.25.  Anticoagulation responsible provider: Bensimhon MD, [name].  INR POC: 1.7.    Anticoagulation Management Assessment/Plan:      The patient's current anticoagulation dose is Warfarin sodium 5 mg tabs: Use as directed by Anticoagulation Clinic.  The target INR is 2.0-3.0.  The next INR is due 03/16/2009.  Anticoagulation instructions were given to patient.  Results were reviewed/authorized by Charlena Cross, RN, BSN.  He was notified by Charlena Cross, RN, BSN.         Prior Anticoagulation Instructions: CONTINUE ON CURRENT DOSE (5MG  DAILY) UNTIL PROCEDURE 11/18.  Current Anticoagulation Instructions: coumadin 7.5 mg today then resume 5mg  daily

## 2010-05-09 NOTE — Progress Notes (Signed)
Summary: MEDS  Phone Note Call from Patient Call back at Home Phone [phone]   Caller: self Call For: Pocahontas Cohenour Summary of Call: PT HAS QUESTIONS ABOUT MEDS-HE IS HAVING CONSTANT DIARRHEA-PLEASE CALL #[phone] OR ON HIS CELL #[phone] Initial call taken by: Harlon Flor,  May 27, 2009 10:25 AM  Follow-up for Phone Call        pt having GI upset from pradaxa and would like to switch back to coumadin.  Per Shelby Dubin, PhD continue on pradaxa and restart coumadin.  continue on pradaxa until INR above 2.0.  Pt will have INR drawn on Monday. Follow-up by: Charlena Cross, RN, BSN,  May 27, 2009 10:45 AM

## 2010-05-09 NOTE — Assessment & Plan Note (Signed)
Summary: HIP AND FOOT HURT / LFW   Vital Signs:  Patient Profile:   77 Years Old Male Weight:      147 pounds Temp:     98.5 degrees F oral Pulse rate:   60 / minute Pulse rhythm:   regular BP sitting:   110 / 70  (left arm) Cuff size:   regular  Vitals Entered By: Providence Crosby (December 16, 2006 12:38 PM)                 Chief Complaint:  right foot// heal pain// right hip.  History of Present Illness: Started with pain in his heel approximately 3-4 weeks ago which is significant at imes, hurt him while driving up here. His right buttock has now started hurting as well, sitting or getting/down in chair or when walking.  Has had significant stomach problems in the past. Has used tyl and alleeve, both of which have helped the discomfort.  Current Allergies: No known allergies       Physical Exam  General:     Well-developed,well-nourished,in no acute distress; alert,appropriate and cooperative throughout examination Msk:     Pain in Right buttock at lateral insertion site of gluteus. Extremities:     pain in right heel, on plantar surface in the usual location and also achillees insertion site and laterally.    Impression & Recommendations:  Problem # 1:  HEEL PAIN, RIGHT (ICD-729.5) Assessment: New Plantar fasciitis vs talo/calcaneal inflamm/arthitis vs achillees tendonitis. Orders: Podiatry Referral (Podiatry) Tyl ES 2 qid  Heat and ice as directed.  Problem # 2:  PAIN IN JOINT, PELVIS/THIGH (RIGHT BUTTOCK) (ICD-719.45) Assessment: New Presumed due to altered gait from heel pain. Use heat and ice as directed, should improve when heel improves and gait normalizes.  Complete Medication List: 1)  Enalapril Maleate 5 Mg Tabs (Enalapril maleate) .Marland Kitchen.. 1 q d 2)  Furosemide 20 Mg Tabs (Furosemide) .... 1/2 tablet q d 3)  Carvedilol 6.25 Mg Tabs (Carvedilol) .Marland Kitchen.. 1 bid 4)  Mucus Relief 400 Mg Tabs (Guaifenesin) .Marland Kitchen.. 1 q d 5)  Astelin 137 Mcg/spray Soln  (Azelastine hcl) .Marland Kitchen.. 1 spray each nostril qd 6)  Nasarel 29 Mcg/act Soln (Flunisolide) .Marland Kitchen.. 1 q d 7)  Singulair 10 Mg Tabs (Montelukast sodium) .Marland Kitchen.. 1qd 8)  Equate Allergy Relief 10mg   .... 1 q d 9)  Zegerid 40-1100 Mg Caps (Omeprazole-sodium bicarbonate) .Marland Kitchen.. 1 q d   Patient Instructions: 1)  Podiatry Referral. 2)  Tylenol ES 2 tabs four times a day regularly. 3)  Heat and ice as directed. 4)  RTC if sxs don't improve.

## 2010-05-09 NOTE — Medication Information (Signed)
Summary: CCR/AMD  Anticoagulant Therapy  Managed by: Inactive Referring MD: Rosebud Poles PCP: Shaune Leeks MD Supervising MD: Ladona Ridgel MD, Sharlot Gowda Indication 1: Atrial Fibrillation [place] Site: [place]           Allergies: 1)  ! Penicillin 2)  ! Terramycin 3)  ! Prilosec 4)  ! Biaxin 5)  ! Pepcid 6)  ! * Zegerid 7)  ! * Ranitidine  Anticoagulation Management History:      Positive risk factors for bleeding include an age of 9 years or older.  The bleeding index is 'intermediate risk'.  Positive CHADS2 values include History of CHF and Age > 75 years old.  His last INR was 2.25.  Anticoagulation responsible provider: Ladona Ridgel MD, Sharlot Gowda.    Anticoagulation Management Assessment/Plan:      The patient's current anticoagulation dose is Warfarin sodium 5 mg tabs: Use as directed by Anticoagulation Clinic.  The target INR is 2.0-3.0.  The next INR is due 03/16/2009.  Anticoagulation instructions were given to patient.  Results were reviewed/authorized by Inactive.         Prior Anticoagulation Instructions: pt is stopping coumadin to start pradaxa. will recheck inr on Wed.

## 2010-05-09 NOTE — Medication Information (Signed)
Summary: rov/ewj  Anticoagulant Therapy  Managed by: Cloyde Reams, RN, BSN Referring MD: Tonette Lederer, Fayrene Fearing PCP: Shaune Leeks MD Supervising MD: Mariah Milling Indication 1: Atrial Fibrillation [place] Site: [place] INR POC 2.2 INR RANGE 2.0-3.0  Dietary changes: no    Health status changes: no    Bleeding/hemorrhagic complications: no    Recent/future hospitalizations: no    Any changes in medication regimen? no    Recent/future dental: no  Any missed doses?: no       Is patient compliant with meds? yes       Allergies: 1)  ! Penicillin 2)  ! Terramycin 3)  ! Prilosec 4)  ! Biaxin 5)  ! Pepcid 6)  ! * Zegerid 7)  ! * Ranitidine  Anticoagulation Management History:      The patient is taking warfarin and comes in today for a routine follow up visit.  Positive risk factors for bleeding include an age of 75 years or older.  The bleeding index is 'intermediate risk'.  Positive CHADS2 values include History of CHF and Age > 40 years old.  His last INR was 2.25.  Anticoagulation responsible provider: [place].  INR POC: 2.2.  Cuvette Lot#: [id number].  Exp: 01/2011.    Anticoagulation Management Assessment/Plan:      The patient's current anticoagulation dose is Warfarin sodium 5 mg tabs: Use as directed by Anticoagulation Clinic.  The target INR is 2.0-3.0.  The next INR is due 12/14/2009.  Anticoagulation instructions were given to patient.  Results were reviewed/authorized by Cloyde Reams, RN, BSN.  He was notified by Cloyde Reams RN.         Prior Anticoagulation Instructions: INR 1.6  Take 2 tablets today, then resume 1 tablet daily except 1.5 tablets on Mondays, Wednesdays, and Fridays.  Recheck in 2 weeks.    Current Anticoagulation Instructions: INR 2.2  Continue on same dosage 1 tablet daily except 1.5 tablets on Mondays, Wednesdays, and Fridays.  Recheck in 4 weeks.

## 2010-05-09 NOTE — Letter (Signed)
Summary: Appointment - Reminder 2  [facility], Main Office  [address]   [place], [place] [address]   Phone: [phone]  Fax: [phone]     November 24, 2008 MRN: [id number]   [name] [address] [place], [place]  [address]   Dear [name],  Our records indicate that it is time to schedule a follow-up appointment.  Dr.HOCHREIN recommended that you follow up with [place] in OCTOBER 2010. It is very important that we reach you to schedule this appointment. We look forward to participating in your health care needs. Please contact us at the number listed above at your earliest convenience to schedule your appointment.  If you are unable to make an appointment at this time, give [place] a call so we can update our records.     Sincerely, GESILA,DAVIS  Glass blower/designer

## 2010-05-09 NOTE — Letter (Signed)
Summary: Remote Device Check  [facility], Main Office  [address]   [place], [place] [address]   Phone: [phone]  Fax: [phone]     December 26, 2009 MRN: [id number]   [name] [address] [place], [place]  [address]   Dear Mr. Acri,   Your remote transmission was recieved and reviewed by your physician.  All diagnostics were within normal limits for you.  __X____Your next office visit is scheduled for:  December with Dr Ladona Ridgel. Please call our office to schedule an appointment.    Sincerely,  Vella Kohler

## 2010-05-09 NOTE — Assessment & Plan Note (Signed)
Summary: CHECK UP/CLE   Vital Signs:  Patient Profile:   77 Years Old Male Height:     65 inches (175.26 cm) Weight:      148 pounds Temp:     98.1 degrees F oral Pulse rate:   60 / minute BP sitting:   113 / 71  (left arm) Cuff size:   regular  Vitals Entered By: Cooper Render (October 17, 2007 9:36 AM)                 Chief Complaint:  ck up and given stool cards.  History of Present Illness: Here for Comp Exam, pulled acre of corn...silver queen.    Current Allergies (reviewed today): ! PENICILLIN ! TERRAMYCIN ! PRILOSEC ! BIAXIN ! PEPCID    Risk Factors:  Tobacco use:  never Passive smoke exposure:  no Drug use:  no HIV high-risk behavior:  no Caffeine use:  1 drinks per day Alcohol use:  no Exercise:  yes    Times per week:  6    Type:  [employer]  Seatbelt use:  100 %   Review of Systems  General      Denies chills, fatigue, fever, loss of appetite, malaise, sleep disorder, sweats, weakness, and weight loss.  Eyes      Denies blurring, discharge, double vision, eye irritation, eye pain, halos, itching, light sensitivity, red eye, vision loss-1 eye, and vision loss-both eyes.  ENT      Denies decreased hearing, difficulty swallowing, ear discharge, earache, hoarseness, nasal congestion, nosebleeds, postnasal drainage, ringing in ears, sinus pressure, and sore throat.  CV      Denies bluish discoloration of lips or nails, chest pain or discomfort, difficulty breathing at night, difficulty breathing while lying down, fainting, fatigue, leg cramps with exertion, lightheadness, near fainting, palpitations, shortness of breath with exertion, swelling of feet, swelling of hands, and weight gain.  Resp      Denies chest discomfort, chest pain with inspiration, cough, coughing up blood, excessive snoring, hypersomnolence, morning headaches, pleuritic, shortness of breath, sputum productive, and wheezing.      Dr Stoddard Callas has helped significantly.  GI  Denies abdominal pain, bloody stools, change in bowel habits, constipation, dark tarry stools, diarrhea, excessive appetite, gas, hemorrhoids, indigestion, loss of appetite, nausea, vomiting, vomiting blood, and yellowish skin color.  GU      Complains of nocturia.      Denies decreased libido, discharge, dysuria, erectile dysfunction, genital sores, hematuria, incontinence, urinary frequency, and urinary hesitancy.      mild  MS      Complains of stiffness.      Denies joint pain, joint redness, joint swelling, loss of strength, low back pain, mid back pain, muscle aches, muscle , cramps, muscle weakness, and thoracic pain.      typically  Derm      Denies changes in color of skin, changes in nail beds, dryness, excessive perspiration, flushing, hair loss, insect bite(s), itching, lesion(s), poor wound healing, and rash.  Neuro      Denies brief paralysis, difficulty with concentration, disturbances in coordination, falling down, headaches, inability to speak, memory loss, numbness, poor balance, seizures, sensation of room spinning, tingling, tremors, visual disturbances, and weakness.   Physical Exam  General:     Well-developed,well-nourished,in no acute distress; alert,appropriate and cooperative throughout examination. Head:     Normocephalic and atraumatic without obvious abnormalities. No apparent alopecia or balding. Eyes:     Conjunctiva clear bilaterally.  Ears:  External ear exam shows no significant lesions or deformities.  Otoscopic examination reveals clear canals, tympanic membranes are intact bilaterally without bulging, retraction, inflammation or discharge. Hearing is grossly normal bilaterally. Nose:     External nasal examination shows no deformity or inflammation. Nasal mucosa are pink and moist without lesions or exudates. Mouth:     Oral mucosa and oropharynx without lesions or exudates.  Teeth in good repair. Neck:     No deformities, masses, or  tenderness noted. Chest Wall:     No deformities, masses, tenderness or gynecomastia noted. Breasts:     No masses or gynecomastia noted Lungs:     Normal respiratory effort, chest expands symmetrically. Lungs are clear to auscultation, no crackles or wheezes. Heart:     Normal rate and regular rhythm. S1 and S2 normal without gallop, murmur, click, rub or other extra sounds. Abdomen:     Bowel sounds positive,abdomen soft and non-tender without masses, organomegaly or hernias noted. Rectal:     No external abnormalities noted. Normal sphincter tone. No rectal masses or tenderness. G neg. Genitalia:     Testes bilaterally descended without nodularity, tenderness or masses. No scrotal masses or lesions. No penis lesions or urethral discharge. Prosthesis in penis with eqpt in scrotal sac. Prostate:     Prostate gland firm and smooth, no enlargement, nodularity, tenderness, mass, asymmetry or induration. 10gms. Msk:     No deformity or scoliosis noted of thoracic or lumbar spine.   Pulses:     R and L carotid,radial,femoral,dorsalis pedis and posterior tibial pulses are full and equal bilaterally Extremities:     No clubbing, cyanosis, edema, or deformity noted with normal full range of motion of all joints.   Neurologic:     No cranial nerve deficits noted. Station and gait are normal. Plantar reflexes are down-going bilaterally. DTRs are symmetrical throughout. Sensory, motor and coordinative functions appear intact. Skin:     Intact without suspicious lesions or rashes Cervical Nodes:     No lymphadenopathy noted Inguinal Nodes:     No significant adenopathy Psych:     Cognition and judgment appear intact. Alert and cooperative with normal attention span and concentration. No apparent delusions, illusions, hallucinations    Impression & Recommendations:  Problem # 1:  BENIGN PROSTATIC HYPERTROPHY (ICD-600.00) Assessment: Unchanged Stable.  Problem # 2:  HYPERLIPIDEMIA  (ICD-272.4) Assessment: Unchanged Stable and acceptable. Cont curr therapy. Labs Reviewed: Chol: 154 (10/13/2007)   HDL: 35.7 (10/13/2007)   LDL: 100 (10/13/2007)   TG: 91 (10/13/2007) SGOT: 15 (10/13/2007)   SGPT: 14 (10/13/2007)   Problem # 3:  GERD (ICD-530.81) Assessment: Unchanged Stable on curr regimen, cont. His updated medication list for this problem includes:    Zegerid 40-1100 Mg Caps (Omeprazole-sodium bicarbonate) .Marland Kitchen... 1 tab  every other day  Diagnostics Reviewed:  Discussed lifestyle modifications, diet, antacids/medications, and preventive measures.  Problem # 4:  CONGESTIVE HEART FAILURE (ICD-428.0) Assessment: Unchanged Stable. His updated medication list for this problem includes:    Enalapril Maleate 5 Mg Tabs (Enalapril maleate) .Marland Kitchen... 1 every day    Furosemide 20 Mg Tabs (Furosemide) .Marland Kitchen... 1/2 tablet every day as needed    Carvedilol 6.25 Mg Tabs (Carvedilol) .Marland Kitchen... 1 tab twice a day   Problem # 5:  ALLERGIC RHINITIS (ICD-477.9) Assessment: Unchanged Great control. His updated medication list for this problem includes:    Astelin 137 Mcg/spray Soln (Azelastine hcl) .Marland Kitchen... 1 spray each nostril qd    Nasarel 29 Mcg/act Soln (Flunisolide) .Marland KitchenMarland KitchenMarland KitchenMarland Kitchen  1 spray every day Discussed use of allergy medications and environmental measures.   Complete Medication List: 1)  Enalapril Maleate 5 Mg Tabs (Enalapril maleate) .Marland Kitchen.. 1 every day 2)  Furosemide 20 Mg Tabs (Furosemide) .... 1/2 tablet every day as needed 3)  Carvedilol 6.25 Mg Tabs (Carvedilol) .Marland Kitchen.. 1 tab twice a day 4)  Mucus Relief 400 Mg Tabs (Guaifenesin) .Marland Kitchen.. 1 tab every day 5)  Astelin 137 Mcg/spray Soln (Azelastine hcl) .Marland Kitchen.. 1 spray each nostril qd 6)  Nasarel 29 Mcg/act Soln (Flunisolide) .Marland Kitchen.. 1 spray every day 7)  Singulair 10 Mg Tabs (Montelukast sodium) .Marland Kitchen.. 1 tab every day as needed 8)  Equate Allergy Relief 10mg   .... 1 tab every day 9)  Zegerid 40-1100 Mg Caps (Omeprazole-sodium bicarbonate) .Marland Kitchen.. 1 tab   every other day 10)  Valium 5 Mg Tabs (Diazepam) .... 1/4 tab at bedtime as needed   Patient Instructions: 1)  Tdap today...forgot!  WIll give next visit.   Prescriptions: VALIUM 5 MG  TABS (DIAZEPAM) 1/4 tab at bedtime as needed  #30 x 0   Entered and Authorized by:   Shaune Leeks MD   Signed by:   Shaune Leeks MD on 10/17/2007   Method used:   Print then Give to Patient   RxID:   [id number]  ] Prior Medications (reviewed today): ENALAPRIL MALEATE 5 MG  TABS (ENALAPRIL MALEATE) 1 every day FUROSEMIDE 20 MG  TABS (FUROSEMIDE) 1/2 tablet every day as needed CARVEDILOL 6.25 MG  TABS (CARVEDILOL) 1 tab twice a day ASTELIN 137 MCG/SPRAY  SOLN (AZELASTINE HCL) 1 spray each nostril qd NASAREL 29 MCG/ACT  SOLN (FLUNISOLIDE) 1 spray every day SINGULAIR 10 MG  TABS (MONTELUKAST SODIUM) 1 tab every day as needed EQUATE ALLERGY RELIEF 10MG  () 1 tab every day VALIUM 5 MG  TABS (DIAZEPAM) 1/4 tab at bedtime as needed Current Allergies (reviewed today): ! PENICILLIN ! TERRAMYCIN ! PRILOSEC ! BIAXIN ! PEPCID

## 2010-05-09 NOTE — Assessment & Plan Note (Signed)
Summary: eph   Visit Type:  Follow-up Primary Provider:  Shaune Leeks MD  CC:  follow-up from cardioversion. Marland Kitchen  History of Present Illness: [name] returns today for followup of his atrial flutter and CHF.  He is a pleasant 77 yo man with a h/o DCM, LBBB, and is s/p BiV ICD.  He has had increasing dyspnea and was found to be in atrial flutter with a RVR.   He has subsequently undergone invasive EP study where he was found to have left atrial flutter and he was cardioverted.  He denies c/p.  Still class 2 CHF symptoms. No intercurrent ICD therapies. His main complaint today is that he gets gas with his coumadin.  I have instructed him on the importance of anti-coagulation and offered him Pradaxa.     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 1 By Mouth Two Times A Day 2)  Furosemide 20 Mg  Tabs (Furosemide) .... 1/2 Tablet Every Day 3)  Carvedilol 6.25 Mg  Tabs (Carvedilol) .Marland Kitchen.. 1 Tab Twice A Day 4)  Mucus Relief 400 Mg  Tabs (Guaifenesin) .Marland Kitchen.. 1 Tab Every Day 5)  Nasarel 29 Mcg/act  Soln (Flunisolide) .Marland Kitchen.. 1 Spray Every Day 6)  Singulair 10 Mg  Tabs  (Montelukast Sodium) .Marland Kitchen.. 1 Tab Every Day As Needed 7)  Equate Allergy Relief 10mg  .... 1 Tab Every Day 8)  Valium 5 Mg  Tabs (Diazepam) .... 1/4 Tab At Bedtime As Needed 9)  Carafate 1 Gm Tabs (Sucralfate) .... 1/2 Tablet By Mouth As Needed 10)  Advair Diskus 100-50 Mcg/dose Aepb (Fluticasone-Salmeterol) .Marland Kitchen.. 1 Inhalation Twice A Day By Mouth 11)  Astepro 0.15 % Soln (Azelastine Hcl) .Marland Kitchen.. 1 Spray Each Nostril As Needed 12)  Warfarin Sodium 5 Mg Tabs (Warfarin Sodium) .... Use As Directed By Anticoagulation Clinic  Allergies (verified): 1)  ! Penicillin 2)  ! Terramycin 3)  ! Prilosec 4)  ! Biaxin 5)  ! Pepcid 6)  ! * Zegerid 7)  ! * Ranitidine  Past History:  Past Medical History: Last updated: 01/12/2009 LBBB (ICD-426.3) ICD - IN SITU (ICD-V45.02) - EF 35% - (06/2004) CARDIOMYOPATHY, SECONDARY (ICD-425.9) - (06/2004) HYPERLIPIDEMIA (ICD-272.4) - (05/2000) COPD  VIA CXR (ICD-496) - :(07/2003) GERD (ICD-530.81) - via egd withh.h., acute gastritis, duodenitis:(12/2004) GAS/BLOATING (ICD-787.3) GOITER (ICD-240.9) PORPHYRIA (ICD-277.1) IBS (ICD-564.1) BENIGN PROSTATIC HYPERTROPHY (ICD-600.00) - (1995) DIVERTICULITIS, HX OF (ICD-V12.79) - , hx of:(01/1998) ALLERGIC RHINITIS (ICD-477.9) - 2002 PAIN IN JOINT, PELVIS/THIGH (RIGHT BUTTOCK) (ICD-719.45) HEEL PAIN, RIGHT (ICD-729.5) SPECIAL SCREENING MALIG NEOPLASMS OTHER SITES (ICD-V76.49)  Past Surgical History: Last updated: 02/23/2009 PENILE PROSTHESIS CHOLEYCYSTECTOMY GOITER REMOVAL CATH CLEART MID 1990s NASAL SURGERY(:1971) COLONOSCOPY;  DIVERTIC,SPLENIC, HEPATIC  FLEURE ONLY:(01/1998) EGD STRICTURE,SLIDING H/H. ; GERD:(01/1998) EGD, DILITATION:(02/18/1998) EGD STRICTURE ; GASTRITIS, H.H. , GERD NO DILITATION TODAY DR. PERRY:(04/08/2001) HERNIA REPAIR:(DR. MARTIN):(01/30/2002) PLUMONARY EVALUATION ([facility]) CHRONIC CONGESTIVE  SYMPTOMS:(04/2002) CATHERIZATION, SEVERE NONISCHEMIA CARDIOMYOPATHY, EF 25 TO 30  %:(06/22/2004) ECHO EF25-30% ,MOD LVH; LA SEVERE DILATATION; MILD A.R.; IRTR:(07/13/2004) EGD, STRICTURE; GASTRITIS, DUODENITIS, GERD:(12/22/2004) BIVENTRICULAR ICD IMPLANTATION; 2ND TO CHF AND NON-ISCHEMICA CARDIOMYOPATHY:(12/02/2006) INFLATABLE PENILE PROTHESIS (DR. OTELIN):05/2005) ECHO HYPOKINESIS POSTERIOR WALL, EF 35%, MILD AR:(11/27/2006) EGD REFLUX  ERYTHEM. DUOD. (DR [name]) (10/21/2008) COLONOSCOPY   ABORTED   DIVERTICS INT HEMMS (DR [name]) (10/21/2008) [facility] AFLUTTER CARDIOVERTED 02/22/2009  Review of Systems  The patient denies chest pain, syncope, dyspnea on exertion, and peripheral edema.    Vital Signs:  Patient profile:   77 year old male Height:      67 inches Weight:      150 pounds BMI:     23.58 Pulse rate:   64 / minute BP sitting:   122 / 68  (right arm) Cuff size:   regular  Physical Exam  General:  Well developed, well nourished, in no acute distress. Head:  normocephalic and atraumatic Eyes:  PERRLA/EOM intact; conjunctiva and lids normal. Mouth:  Gums and palate normal. Oral mucosa normal. Neck:  Neck supple, no JVD. No masses, thyromegaly or abnormal cervical nodes. Chest Wall:  Well healed ICD pocket Lungs:  Clear bilaterally to auscultation.  No wheezes, rales, or rhonchi. Heart:  IRIR with normal S1 and S2.  PMI is enlarged and laterally displaced.  No murmur was appreciated. Abdomen:  Bowel sounds positive; abdomen soft and non-tender without masses, organomegaly, or hernias noted. No hepatosplenomegaly. Msk:  Back normal, normal gait. Muscle strength and tone normal. Pulses:  normal pedal pulses bilaterally.   Extremities:  No clubbing or cyanosis. Neurologic:  Alert and oriented x 3.    ICD Specifications Following MD:  [name] Bunting, MD     ICD Vendor:  Medtronic     ICD Model Number:  7299     ICD Serial Number:  [id number] H ICD DOI:  08/31/2005     ICD Implanting MD:  [name] Bunting, MD  Lead 1:    Location: RA     DOI: 08/31/2005     Model  #: [id number]     Serial #: [id number]     Status: active Lead 2:    Location: RV     DOI: 08/31/2005     Model #: [id number]     Serial #: [id number]     Status: active Lead 3:    Location: LV     DOI: 08/31/2005     Model #: [id number]     Serial #: [id number]     Status: active  Indications::  NICM, CHF   ICD Follow Up Battery Voltage:  2.93 V     Charge Time:  9.87 seconds     ICD Dependent:  Yes       ICD Device Measurements Atrium:  Amplitude: 2.2 mV, Impedance: 512 ohms,  Right Ventricle:  Impedance: 584 ohms,  Left Ventricle:  Impedance: 344 ohms,  Shock Impedance: 41/51 ohms   Episodes MS Episodes:  2     Coumadin:  Yes Atrial Pacing:  35.7%     Ventricular Pacing:  99.9%  Brady Parameters Mode DDD     Lower Rate Limit:  60     Upper Rate Limit 130 PAV 130     Sensed AV Delay:  100  Tachy Zones VF:  200  VT:  240     VT1:  176     Next Remote Date:  06/14/2009     Next Cardiology Appt Due:  03/09/2010 Tech Comments:  No mode switch episodes since discharge 02/22/2009.  Optivol normal.  [facility]link transmissions every 3 months.  ROV 1 year Dr. Ladona Ridgel in [place]. MD Comments:  Agree with above.  Impression & Recommendations:  Problem # 1:  ATRIAL FLUTTER (ICD-427.32) He has had no recurrence despite DCCV and not being on an anti-arrhythmic drug.  Will continue a period of watchful waiting. He has inquired into changing from coumadin to Pradaxa and we will arrange this.  Problem # 2:  ICD - IN SITU (ICD-V45.02) His device is working normally.  Will recheck in several months.  Problem # 3:  CONGESTIVE HEART FAILURE (ICD-428.0) He remains euvolemic and his optivol is below threshold.  Continue current meds and maintain a low sodium diet. His updated medication list for this problem includes:    Enalapril Maleate 5 Mg Tabs (Enalapril maleate) .Marland Kitchen... 1 by mouth two times a day    Furosemide 20 Mg Tabs (Furosemide) .Marland Kitchen... 1/2 tablet every day    Carvedilol 6.25 Mg Tabs (Carvedilol) .Marland Kitchen... 1  tab twice a day    Warfarin Sodium 5 Mg Tabs (Warfarin sodium) ..... Use as directed by anticoagulation clinic

## 2010-05-09 NOTE — Letter (Signed)
Summary: [facility]   Imported By: Lanelle Bal 01/12/2010 13:45:34  _____________________________________________________________________  External Attachment:    Type:   Image     Comment:   External Document

## 2010-05-09 NOTE — Assessment & Plan Note (Signed)
Summary: 1 yr 428.22 427.32  pfh,rn/ increase fluid/ gd   Visit Type:  Follow-up Primary Provider:  Shaune Leeks MD  CC:  Rectal Bleeding.  History of Present Illness: The patient presents for followup of his cardiomyopathy. This was a routine followup. However, coincidentally he has had some arm and chest discomfort. This is his left arm. It has been sporadic. He's had some chest throbbing. It feels like "gas". He has not had it in the last few days. Is not able to bring this on. He says it's a deep pain. He was not describing associated symptoms. It would come and go at rest. Coincidentally he has apparent bowel problems with by his report spastic colon. This flares of couple times per year. He said he had a severe episode of intense abdominal discomfort yesterday similar to his previous bouts. Following this he had red blood noted per rectum. His wife said there was some in the toilet and on the toilet paper though it didn't turn the water pink. He thought he was a significant amount. He says there is less this morning. He has not had any presyncope or syncope. He has had no new shortness of breath, PND or orthopnea. He had no hematemesis no he was vomiting yesterday with yellow bilious fluid.  Current Medications (verified): 1)  Enalapril Maleate 5 Mg  Tabs (Enalapril Maleate) .Marland Kitchen.. 1 By Mouth Two Times A Day 2)  Furosemide 20 Mg  Tabs (Furosemide) .... 1/2 Tablet Every Day 3)  Carvedilol 6.25 Mg  Tabs (Carvedilol) .Marland Kitchen.. 1 Tab Twice A Day 4)  Mucus Relief 400 Mg  Tabs (Guaifenesin) .Marland Kitchen.. 1 Tab Every Day 5)  Nasarel 29 Mcg/act  Soln (Flunisolide) .Marland Kitchen.. 1 Spray Every Day 6)  Singulair 10 Mg  Tabs (Montelukast Sodium) .Marland Kitchen.. 1 Tab Every Day As Needed 7)  Equate Allergy Relief 10mg  .... 1 Tab Every Day 8)  Valium 5 Mg  Tabs (Diazepam) .... 1/4 Tab At Bedtime As Needed 9)  Carafate 1 Gm Tabs (Sucralfate) .... 1/2 Tablet By Mouth As Needed 10)  Advair Diskus 100-50 Mcg/dose Aepb  (Fluticasone-Salmeterol) .Marland Kitchen.. 1 Inhalation Twice A Day By Mouth 11)  Astepro 0.15 % Soln (Azelastine Hcl) .Marland Kitchen.. 1 Spray Each Nostril As Needed 12)  Warfarin Sodium 5 Mg Tabs (Warfarin Sodium) .... Use As Directed By Anticoagulation Clinic  Allergies (verified): 1)  ! Penicillin 2)  ! Terramycin 3)  ! Prilosec 4)  ! Biaxin 5)  ! Pepcid 6)  ! * Zegerid 7)  ! * Ranitidine  Past History:  Past Medical History: Reviewed history from 12/28/2009 and no changes required. LBBB (ICD-426.3) ICD - IN SITU (ICD-V45.02) - EF 35% - (06/2004) CARDIOMYOPATHY, SECONDARY (ICD-425.9) - (06/2004) HYPERLIPIDEMIA (ICD-272.4) - (05/2000) COPD  VIA CXR (ICD-496) - :(07/2003) GERD (ICD-530.81) - via egd withh.h., acute gastritis, duodenitis:(12/2004) GAS/BLOATING (ICD-787.3) GOITER (ICD-240.9) PORPHYRIA (ICD-277.1) IBS (ICD-564.1) BENIGN PROSTATIC HYPERTROPHY (ICD-600.00) - (1995) DIVERTICULITIS, HX OF (ICD-V12.79) - , hx of:(01/1998) ALLERGIC RHINITIS (ICD-477.9) - and asthma per Dr. Dell Callas PAIN IN JOINT, PELVIS/THIGH (RIGHT BUTTOCK) (ICD-719.45) HEEL PAIN, RIGHT (ICD-729.5) SPECIAL SCREENING MALIG NEOPLASMS OTHER SITES (ICD-V76.49)  Past Surgical History: Reviewed history from 02/23/2009 and no changes required. PENILE PROSTHESIS CHOLEYCYSTECTOMY GOITER REMOVAL CATH CLEART MID 1990s NASAL SURGERY(:1971) COLONOSCOPY;  DIVERTIC,SPLENIC, HEPATIC FLEURE ONLY:(01/1998) EGD STRICTURE,SLIDING H/H. ; GERD:(01/1998) EGD, DILITATION:(02/18/1998) EGD STRICTURE ; GASTRITIS, H.H. , GERD NO DILITATION TODAY DR. PERRY:(04/08/2001) HERNIA REPAIR:(DR. MARTIN):(01/30/2002) PLUMONARY EVALUATION ([facility]) CHRONIC CONGESTIVE  SYMPTOMS:(04/2002) CATHERIZATION, SEVERE NONISCHEMIA CARDIOMYOPATHY, EF 25  TO 30 %:(06/22/2004) ECHO EF25-30% ,MOD LVH; LA SEVERE DILATATION; MILD A.R.; IRTR:(07/13/2004) EGD, STRICTURE; GASTRITIS, DUODENITIS, GERD:(12/22/2004) BIVENTRICULAR ICD IMPLANTATION; 2ND TO CHF AND NON-ISCHEMICA  CARDIOMYOPATHY:(12/02/2006) INFLATABLE PENILE PROTHESIS (DR. OTELIN):05/2005) ECHO HYPOKINESIS POSTERIOR WALL, EF 35%, MILD AR:(11/27/2006) EGD REFLUX  ERYTHEM. DUOD. (DR ELLIOTT) (10/21/2008) COLONOSCOPY   ABORTED   DIVERTICS INT HEMMS (DR ELLIOTT) (10/21/2008) [facility] AFLUTTER CARDIOVERTED 02/22/2009  Family History: Reviewed history from 12/08/2008 and no changes required. Father: DECEASED 54 YOA, CAD, HTN Mother: DECEASED 82 YOA CATH AT 70 YOA NORMAL NO BLOCKAGE, CHF SISTER A 76  DYSPHAGIA STOMACH PROBS SISTER A 63 STOMACH PROBS SISTER A 62 STOMACH PROBS CV: + FATHER, + UNCLES HBP: + FATHER DM: NEGATIVE GOUT/ARTHRITIS: PROSTATE/CANCER: BREAST/OVARIAN/UTERINE CANCER:+ COUISINS, +AUNT COLON/CANCER: DEPRESSION: NEGATIVE ETOH ABUSE: +M UNCLES OTHER : STROKE, NEGATIVE  Social History: Reviewed history from 09/13/2008 and no changes required. 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  Alcohol Use - no Regular Exercise - no Drug Use - no  Review of Systems       As stated in the HPI and negative for all other systems.   Vital Signs:  Patient profile:   77 year old male Height:      67 inches Weight:      142 pounds BMI:     22.32 Pulse rate:   81 / minute Resp:     16 per minute BP sitting:   110 / 76  (right arm)  Vitals Entered By: Marrion Coy, CNA (January 03, 2010 8:50 AM)  Physical Exam  General:  Well developed, well nourished, in no acute distress. Head:  normocephalic and atraumatic Eyes:  PERRLA/EOM intact; conjunctiva and lids normal. Mouth:  Upper dentures, gums and palate normal. Oral mucosa normal. Neck:  Neck supple, no JVD. No masses, thyromegaly or abnormal cervical nodes. Chest Wall:  Well healed ICD pocket Lungs:  Clear bilaterally to auscultation.  No wheezes, rales, or rhonchi. Abdomen:  Bowel sounds positive; abdomen soft and non-tender  without masses, organomegaly, or hernias noted. No hepatosplenomegaly. Msk:  Back normal, normal gait. Muscle strength and tone normal. Extremities:  No clubbing or cyanosis. Neurologic:  Alert and oriented x 3. Skin:  Intact without lesions or rashes. Cervical Nodes:  no significant adenopathy Axillary Nodes:  no significant adenopathy Psych:  Normal affect.   Detailed Cardiovascular Exam  Neck    Carotids: Carotids full and equal bilaterally without bruits.      Neck Veins: Normal, no JVD.    Heart    Inspection: no deformities or lifts noted.      Palpation: normal PMI with no thrills palpable.      Auscultation: regular rate and rhythm, S1, S2 without murmurs, rubs, gallops, or clicks.    Vascular    Abdominal Aorta: no palpable masses, pulsations, or audible bruits.      Femoral Pulses: normal femoral pulses bilaterally.      Pedal Pulses: normal pedal pulses bilaterally.      Radial Pulses: normal radial pulses bilaterally.      Peripheral Circulation: no clubbing, cyanosis, or edema noted with normal capillary refill.      ICD Specifications Following MD:  Lewayne Bunting, MD     ICD Vendor:  [employer]     ICD Model Number:  7299     ICD Serial Number:  [id number] ICD DOI:  08/31/2005     ICD Implanting MD:  Sharlot Gowda  Ladona Ridgel, MD  Lead 1:    Location: RA     DOI: 08/31/2005     Model #: [id number]     Serial #: [id number]     Status: active Lead 2:    Location: RV     DOI: 08/31/2005     Model #: [id number]     Serial #: [id number]     Status: active Lead 3:    Location: LV     DOI: 08/31/2005     Model #: [id number]     Serial #: [id number]     Status: active  Indications::  NICM, CHF   ICD Follow Up ICD Dependent:  Yes      Episodes Coumadin:  Yes  Brady Parameters Mode DDD     Lower Rate Limit:  60     Upper Rate Limit 130 PAV 130     Sensed AV Delay:  100  Tachy Zones VF:  200     VT:  240     VT1:  176     Impression & Recommendations:  Problem # 1:  RECTAL BLEEDING  (ICD-569.3) I will check an INR today and hold his Coumadin. I'll check a CBC. I called his gastroenterologist who suggested that he be admitted to the hospitalist either at our hospital or [place]. The patient chooses [place] and I will arrange this hospitalization. Orders: TLB-CBC Platelet - w/Differential ([id number]-CBCD)  Problem # 2:  ATRIAL FLUTTER (ICD-427.32) Unfortunately the patient is currently in flutter. However, until we resolve the GI bleeding issue needs to be off the Coumadin. Of note there was an attempted flutter ablation in November of last year but it was left-sided and so he was only cardioverted.  Problem # 3:  CARDIOMYOPATHY, SECONDARY (ICD-425.9) He has a cardiomyopathy but seems to be euvolemic. For now I won't change his medicines other than the Coumadin.  Problem # 4:  CHEST PAIN (ICD-786.50) The patient has some chest pain that has atypical and typical features. In 2006 he had minimal nonobstructive coronary disease. My suggestion would be stress testing with perfusion imaging once we get the GI bleeding issue resolved.  Other Orders: TLB-BMP (Basic Metabolic Panel-BMET) ([id number]-METABOL)  Patient Instructions: 1)  Your physician recommends that you schedule a follow-up appointment as directed 2)  Your physician recommends that you have  lab work today:  BMP and CBC.   3)  Your physician has recommended you make the following change in your medication: Stop Coumadin

## 2010-05-09 NOTE — Medication Information (Signed)
Summary: ccr  Anticoagulant Therapy  Managed by: Charlena Cross, RN, BSN Referring MD: Tonette Lederer, Fayrene Fearing PCP: Shaune Leeks MD Supervising MD: Mariah Milling Indication 1: Atrial Fibrillation [place] Site: [place] INR POC 2.1 INR RANGE 2.0-3.0  Dietary changes: no    Health status changes: no    Bleeding/hemorrhagic complications: no    Recent/future hospitalizations: no    Any changes in medication regimen? yes       Details: stopped pradaxa Thurs, started coumadin on thurs- pradaxa caused GI upset  Recent/future dental: no  Any missed doses?: no       Is patient compliant with meds? yes       Allergies: 1)  ! Penicillin 2)  ! Terramycin 3)  ! Prilosec 4)  ! Biaxin 5)  ! Pepcid 6)  ! * Zegerid 7)  ! * Ranitidine  Anticoagulation Management History:      The patient is taking warfarin and comes in today for a routine follow up visit.  Positive risk factors for bleeding include an age of 75 years or older.  The bleeding index is 'intermediate risk'.  Positive CHADS2 values include History of CHF and Age > 41 years old.  His last INR was 2.25.  Anticoagulation responsible provider: [place].  INR POC: 2.1.    Anticoagulation Management Assessment/Plan:      The patient's current anticoagulation dose is Warfarin sodium 5 mg tabs: Use as directed by Anticoagulation Clinic.  The target INR is 2.0-3.0.  The next INR is due 06/06/2009.  Anticoagulation instructions were given to patient.  Results were reviewed/authorized by Charlena Cross, RN, BSN.  He was notified by Charlena Cross, RN, BSN.         Prior Anticoagulation Instructions: pt is stopping coumadin to start pradaxa. will recheck inr on Wed.  Current Anticoagulation Instructions: coumadin 5 mg daily  Prescriptions: WARFARIN SODIUM 5 MG TABS (WARFARIN SODIUM) Use as directed by Anticoagulation Clinic  #30 x 3   Entered by:   Charlena Cross, RN, BSN   Authorized by:   Laren Boom, MD,  [facility]   Signed by:   Charlena Cross, RN, BSN on 05/30/2009   Method used:   Electronically to        [id number] * (retail)       [address]       [place], [place]  [address]       Ph: [phone] or [phone]       Fax: [phone]   RxID:   [id number]

## 2010-05-09 NOTE — Medication Information (Signed)
Summary: CCR/AMD  Anticoagulant Therapy  Managed by: Charlena Cross, RN, BSN Referring MD: Tonette Lederer, Fayrene Fearing PCP: Shaune Leeks MD Supervising MD: Ladona Ridgel MD, Sharlot Gowda Indication 1: Atrial Fibrillation [place] Site: [place] INR POC 2.0  Dietary changes: no    Health status changes: no    Bleeding/hemorrhagic complications: no    Recent/future hospitalizations: no    Any changes in medication regimen? no    Recent/future dental: no  Any missed doses?: no       Is patient compliant with meds? yes       Allergies: 1)  ! Penicillin 2)  ! Terramycin 3)  ! Prilosec 4)  ! Biaxin 5)  ! Pepcid 6)  ! * Zegerid 7)  ! * Ranitidine  Anticoagulation Management History:      The patient is taking warfarin and comes in today for a routine follow up visit.  Positive risk factors for bleeding include an age of 75 years or older.  The bleeding index is 'intermediate risk'.  Positive CHADS2 values include History of CHF and Age > 14 years old.  Anticoagulation responsible [name]: Ladona Ridgel MD, Sharlot Gowda.  INR POC: 2.0.    Anticoagulation Management Assessment/Plan:      The patient's current anticoagulation dose is Warfarin sodium 5 mg tabs: Use as directed by Anticoagulation Clinic.  The target INR is 2.0-3.0.  The next INR is due 01/31/2009.  Anticoagulation instructions were given to patient.  Results were reviewed/authorized by Charlena Cross, RN, BSN.  He was notified by [facility] [facility].         Prior Anticoagulation Instructions: coumadin 10mg  today (2 pills) then coumadin 5mg  every daily except 2.5 mg on Tues and Sat.  Current Anticoagulation Instructions: COUMADIN 10MG  TODAY THEN COUMADIN 5MG  DAILY EXCEPT 2.5MG  ON SAT

## 2010-05-09 NOTE — Progress Notes (Signed)
Summary: refill  Phone Note Refill Request   Refills Requested: Medication #1:  ENALAPRIL MALEATE 5 MG  TABS 1 by mouth two times a day   Supply Requested: 3 months   Notes: 60 count Please contact pharmacy, they are only giving him 30tabs   Method Requested: Telephone to Pharmacy Initial call taken by: Migdalia Dk,  February 23, 2009 2:31 PM  Follow-up for Phone Call        called pharamcy they verifeid its two times a day pt states he received his meds at once daily pt can pick med up at pharmcy as two times a day called pt everything is taken care of Follow-up by: Kem Parkinson,  February 25, 2009 12:54 PM

## 2010-05-09 NOTE — Progress Notes (Signed)
Summary: PLEASE CALL  Phone Note Call from Patient Call back at Home Phone [phone]   Caller: self Call For: TAYLOR Summary of Call: WOULD LIKE A CALL FROM THE NURSE Initial call taken by: Harlon Flor,  April 05, 2009 10:50 AM  Follow-up for Phone Call        samples of pradaxa left at desk for pt to p/u Charlena Cross RN BSN

## 2010-05-09 NOTE — Miscellaneous (Signed)
Summary: [facility]Dr. Challis Callas   Imported By: Eleonore Chiquito 10/13/2008 15:58:38  _____________________________________________________________________  External Attachment:    Type:   Image     Comment:   External Document

## 2010-05-09 NOTE — Medication Information (Signed)
Summary: CCR/GLC  Anticoagulant Therapy  Managed by: Cloyde Reams, RN, BSN Referring MD: Tonette Lederer, Fayrene Fearing PCP: Shaune Leeks MD Supervising MD: Mariah Milling Indication 1: Atrial Fibrillation [place] Site: [place] INR POC 1.8 INR RANGE 2.0-3.0  Dietary changes: no    Health status changes: no    Bleeding/hemorrhagic complications: yes       Details: nosebleed 6/10   Any changes in medication regimen? no     Any missed doses?: no       Is patient compliant with meds? yes       Allergies: 1)  ! Penicillin 2)  ! Terramycin 3)  ! Prilosec 4)  ! Biaxin 5)  ! Pepcid 6)  ! * Zegerid 7)  ! * Ranitidine  Anticoagulation Management History:      The patient is taking warfarin and comes in today for a routine follow up visit.  Positive risk factors for bleeding include an age of 75 years or older.  The bleeding index is 'intermediate risk'.  Positive CHADS2 values include History of CHF and Age > 90 years old.  His last INR was 2.25.  Anticoagulation responsible provider: Nixie Laube.  INR POC: 1.8.  Exp: 11/2010.    Anticoagulation Management Assessment/Plan:      The patient's current anticoagulation dose is Warfarin sodium 5 mg tabs: Use as directed by Anticoagulation Clinic.  The target INR is 2.0-3.0.  The next INR is due 10/03/2009.  Anticoagulation instructions were given to patient.  Results were reviewed/authorized by Cloyde Reams, RN, BSN.  He was notified by Benedict Needy, RN.         Prior Anticoagulation Instructions: INR 1.7  Take 2 tablets today,  then start taking 1 tablet daily except 1.5 tablets on Mondays and Fridays.  Recheck in 2 weeks.    Current Anticoagulation Instructions: INR 1.8  Take 1 tablet daily except 1.5 tablets Monday, Wednesday and Friday.  Recheck in 2 weeks.

## 2010-05-09 NOTE — Medication Information (Signed)
Summary: CCR/AMD  Anticoagulant Therapy  Managed by: Charlena Cross, RN, BSN Referring MD: Tonette Lederer, Fayrene Fearing PCP: Shaune Leeks MD Supervising MD: Graciela Husbands MD, Viviann Spare Indication 1: Atrial Fibrillation [place] Site: [place] INR POC 1.7 INR RANGE 2.0-3.0  Dietary changes: no    Health status changes: no    Bleeding/hemorrhagic complications: no    Recent/future hospitalizations: no    Any changes in medication regimen? no    Recent/future dental: no  Any missed doses?: no       Is patient compliant with meds? yes       Allergies: 1)  ! Penicillin 2)  ! Terramycin 3)  ! Prilosec 4)  ! Biaxin 5)  ! Pepcid 6)  ! * Zegerid 7)  ! * Ranitidine  Anticoagulation Management History:      The patient is taking warfarin and comes in today for a routine follow up visit.  Positive risk factors for bleeding include an age of 75 years or older.  The bleeding index is 'intermediate risk'.  Positive CHADS2 values include History of CHF and Age > 78 years old.  His last INR was 2.25.  Anticoagulation responsible provider: Graciela Husbands MD, Viviann Spare.  INR POC: 1.7.    Anticoagulation Management Assessment/Plan:      The patient's current anticoagulation dose is Warfarin sodium 5 mg tabs: Use as directed by Anticoagulation Clinic.  The target INR is 2.0-3.0.  The next INR is due 08/15/2009.  Anticoagulation instructions were given to patient.  Results were reviewed/authorized by Charlena Cross, RN, BSN.  He was notified by Charlena Cross, RN, BSN.         Prior Anticoagulation Instructions: coumadin 7.5 mg today then resume coumadin 5 mg daily  Current Anticoagulation Instructions: coumadin 5 mg daily with 7.5 mg on Mondays

## 2010-05-09 NOTE — Procedures (Signed)
Summary: [facility]/Upper GI Endoscopy/Dr. Mechele Collin  [facility]/Upper GI Endoscopy/Dr. Mechele Collin   Imported By: Eleonore Chiquito 10/27/2008 10:55:30  _____________________________________________________________________  External Attachment:    Type:   Image     Comment:   External Document

## 2010-05-09 NOTE — Letter (Signed)
Summary: Appointment - Reminder 2  [facility], Main Office  [address]   [place], [place] [address]   Phone: [phone]  Fax: [phone]     November 24, 2008 MRN: [id number]   [name] [address], [place]  [address]   Dear [name],  Our records indicate that it is time to schedule a follow-up appointment.  Dr.HOCHREIN recommended that you follow up with us in OCTOBER 2010. It is very important that we reach you to schedule this appointment. We look forward to participating in your health care needs. Please contact us at the number listed above at your earliest convenience to schedule your appointment.  If you are unable to make an appointment at this time, give us a call so we can update our records.     Sincerely, GESILA,DAVIS  [facility] Scheduling Team 

## 2010-05-09 NOTE — Assessment & Plan Note (Signed)
Summary: F1Y/AMD  Medications Added CARAFATE 1 GM TABS (SUCRALFATE) 1/2 tablet by mouth as needed WARFARIN SODIUM 5 MG TABS (WARFARIN SODIUM) Use as directed by Anticoagulation Clinic        CC:  1 year ROV;Device Check.  History of Present Illness: Mr. Stickler returns today for ICD followup. He notes that he has not felt as well over the past several weeks but admits to being fairly sedentary during that time.  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  He notes rare palpitations.  He denies c/p or peripheral edema or any intercurrent ICD therapies.  Current Medications (verified): 1)  Enalapril Maleate 5 Mg  Tabs (Enalapril Maleate) .Marland Kitchen.. 1 Every Day 2)  Furosemide 20 Mg  Tabs (Furosemide) .... 1/2 Tablet Every Day 3)  Carvedilol 6.25 Mg  Tabs (Carvedilol) .Marland Kitchen.. 1 Tab Twice A Day 4)  Mucus Relief 400 Mg  Tabs (Guaifenesin) .Marland Kitchen.. 1 Tab Every Day 5)  Nasarel 29 Mcg/act  Soln (Flunisolide) .Marland Kitchen.. 1 Spray Every Day 6)  Singulair 10 Mg  Tabs (Montelukast Sodium) .Marland Kitchen.. 1 Tab Every Day As Needed 7)  Equate Allergy Relief 10mg  .... 1 Tab Every Day 8)  Valium 5 Mg  Tabs (Diazepam) .... 1/4 Tab At Bedtime As Needed 9)  Carafate 1 Gm Tabs (Sucralfate) .... 1/2 Tablet By Mouth As Needed 10)  Advair Diskus 100-50 Mcg/dose Aepb (Fluticasone-Salmeterol) .Marland Kitchen.. 1 Inhalation Twice A Day By Mouth 11)  Astepro 0.15 % Soln (Azelastine Hcl) .Marland Kitchen.. 1 Spray Each Nostril As Needed  Allergies: 1)  ! Penicillin 2)  ! Terramycin 3)  ! Prilosec 4)  ! Biaxin 5)  ! Pepcid 6)  ! * Zegerid 7)  ! * Ranitidine  Past History:  Past Medical History: Last updated: 09/13/2008 LBBB (ICD-426.3) ICD - IN SITU (ICD-V45.02) - EF 35% - (06/2004) CARDIOMYOPATHY, SECONDARY (ICD-425.9) - (06/2004) HYPERLIPIDEMIA (ICD-272.4) - (05/2000) COPD  VIA CXR (ICD-496) - :(07/2003) GERD (ICD-530.81) - via egd withh.h., acute gastritis,  duodenitis:(12/2004) GAS/BLOATING (ICD-787.3) GOITER (ICD-240.9) PORPHYRIA (ICD-277.1) IBS (ICD-564.1) BENIGN PROSTATIC HYPERTROPHY (ICD-600.00) - (1995) DIVERTICULITIS, HX OF (ICD-V12.79) - , hx of:(01/1998) ALLERGIC RHINITIS (ICD-477.9) - 2002 PAIN IN JOINT, PELVIS/THIGH (RIGHT BUTTOCK) (ICD-719.45) HEEL PAIN, RIGHT (ICD-729.5) SPECIAL SCREENING MALIG NEOPLASMS OTHER SITES (ICD-V76.49)  Past Surgical History: Last updated: 10/27/2008 PENILE PROSTHESIS CHOLEYCYSTECTOMY GOITER REMOVAL CATH CLEART MID 1990s NASAL SURGERY(:1971) COLONOSCOPY;  DIVERTIC,SPLENIC, HEPATIC FLEURE ONLY:(01/1998) EGD STRICTURE,SLIDING H/H. ; GERD:(01/1998) EGD, DILITATION:(02/18/1998) EGD STRICTURE ; GASTRITIS, H.H. , GERD NO DILITATION TODAY DR. PERRY:(04/08/2001) HERNIA REPAIR:(DR. 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 DUOD. (DR ELLIOTT) (10/21/2008) COLONOSCOPY   ABORTED   DIVERTICS INT HEMMS (DR ELLIOTT) (10/21/2008)  Review of Systems       The patient complains of dyspnea on exertion.  The patient denies chest pain, syncope, and peripheral edema.    Vital Signs:  Patient profile:   77 year old male Height:      66 inches Weight:      142 pounds BMI:     23.00 Pulse rate:   121 / minute BP sitting:   124 / 86  (left arm) Cuff size:  regular  Vitals Entered By: Stanton Kidney, EMT-P (December 20, 2008 9:50 AM)  Physical Exam  General:  Well developed, well nourished, in no acute distress. Head:  normocephalic and  atraumatic Mouth:  Teeth, gums and palate normal. Oral mucosa normal. Neck:  Neck supple, 7 cm JVD. No masses, thyromegaly or abnormal cervical nodes. Chest Wall:  Well healed ICD incision. Lungs:  Rales in the bases bilaterally. Heart:  IRIR with normal S1 and S2.  PMI is enlarged and laterally displaced.  No murmur was appreciated. Abdomen:  Bowel sounds positive; abdomen soft and non-tender without masses, organomegaly, or hernias noted. No hepatosplenomegaly. Msk:  Back normal, normal gait. Muscle strength and tone normal. Pulses:  pulses normal in all 4 extremities Extremities:  No clubbing or cyanosis. No edema. Neurologic:  Alert and oriented x 3.   EKG  Procedure date:  12/20/2008  Findings:      Atrial flutter with a ventricular response rate of:  84   ICD Specifications Following MD:  Lewayne Bunting, MD     ICD Vendor:  [employer]     ICD Model Number:  7299     ICD Serial Number:  [id number] ICD DOI:  08/31/2005     ICD Implanting MD:  Lewayne Bunting, MD  Lead 1:    Location: RA     DOI: 08/31/2005     Model #: [id number]     Serial #: [id number]     Status: active Lead 2:    Location: RV     DOI: 08/31/2005     Model #: [id number]     Serial #: [id number]     Status: active Lead 3:    Location: LV     DOI: 08/31/2005     Model #: [id number]     Serial #: [id number]     Status: active  Indications::  NICM, CHF   ICD Follow Up Remote Check?  No Battery Voltage:  2.96 V     Charge Time:  9.52 seconds     Underlying rhythm:  A-flutter ICD Dependent:  No       ICD Device Measurements Atrium:  Amplitude: 3.6 mV, Impedance: 544 ohms,  Right Ventricle:  Amplitude: 5.1 mV, Impedance: 552 ohms, Threshold: 1.0 V at 0.2 msec Left Ventricle:  Impedance: 352 ohms, Threshold: 1.0 V at 0.1 msec Shock Impedance: 38/48 ohms   Episodes Coumadin:  No Shock:  0     ATP:  0     Nonsustained:  1     Atrial Pacing:  36.3%     Ventricular Pacing:  99.7%  Brady Parameters Mode DDD     Lower Rate Limit:   60     Upper Rate Limit 130 PAV 130     Sensed AV Delay:  100  Tachy Zones VF:  200     VT:  240     VT1:  176     Next Remote Date:  03/22/2009     Next Cardiology Appt Due:  12/08/2009 Tech Comments:  A-flutter since 8/10 - coumadin.  Mode switch programmed on.  Optivol and throacic impedance abnormal throughout August.  One NSVT episode recorded, 6 beats.  [phone] lead stable.  SIC 0. He will send his carelink transmissions every 3 months and return in one year to [facility] with Dr. Ladona Ridgel. Altha Harm, LPN  December 20, 2008 10:03 AM  MD Comments:  Agree with above.  Impression & Recommendations:  Problem # 1:  ATRIAL FLUTTER (  ICD-427.32) The patient's flutter began in August.  I have discussed the treatment options and recommended that he start on coumadin today.  A prescription has been given.  Also I discussed catheter ablation of his atrial flutter.  He is considering his options.  He wants to speak to Dr. Antoine Poche about this in a few weeks.  I have encouraged him to call after to schedule an ablation.  I have discussed the risksk, benefits, goals and expectations with the patient and his wife. His updated medication list for this problem includes:    Carvedilol 6.25 Mg Tabs (Carvedilol) .Marland Kitchen... 1 tab twice a day    Warfarin Sodium 5 Mg Tabs (Warfarin sodium) ..... Use as directed by anticoagulation clinic  Problem # 2:  ICD - IN SITU (ICD-V45.02) His device is working normally. His optiovol was up several weeks ago, coinciding with his development of atrial flutter.  Problem # 3:  CONGESTIVE HEART FAILURE (ICD-428.0) His systolic CHF has gone from class 1 to class 2.  Continue current meds as noted below.  I think he will be improved once he is back in NSR. His updated medication list for this problem includes:    Enalapril Maleate 5 Mg Tabs (Enalapril maleate) .Marland Kitchen... 1 every day    Furosemide 20 Mg Tabs (Furosemide) .Marland Kitchen... 1/2 tablet every day    Carvedilol 6.25 Mg Tabs (Carvedilol)  .Marland Kitchen... 1 tab twice a day    Warfarin Sodium 5 Mg Tabs (Warfarin sodium) ..... Use as directed by anticoagulation clinic  Patient Instructions: 1)  Your physician recommends that you schedule a follow-up appointment in: October 2)  Your physician has recommended you make the following change in your medication: start coumadin 5 mg daily Prescriptions: WARFARIN SODIUM 5 MG TABS (WARFARIN SODIUM) Use as directed by Anticoagulation Clinic  #30 x 3   Entered by:   Charlena Cross, RN, BSN   Authorized by:   Laren Boom, MD, [facility]   Signed by:   Charlena Cross, RN, BSN on 12/20/2008   Method used:   Electronically to        [id number] (retail[facility]       [address]       [place], [place]  [address]       Ph: [phone] or [phone]       Fax: [phone]   RxID:   [id number]   Appended Document: F1Y/AMD    Clinical Lists Changes  Orders: Added new Service order of EKG w/ Interpretation (93000) - Signed

## 2010-05-09 NOTE — Progress Notes (Signed)
Summary: vomiting  Phone Note Call from Patient Call back at Home Phone [phone]   Caller: Patient Call For: Shaune Leeks MD Summary of Call: Pt has had a lot of vomiting and some diarrhea since early am.  Not able to keep anything down... [facility]  Asks if something can be called in for the nausea. Initial call taken by: Lowella Petties,  April 05, 2008 2:25 PM  Follow-up for Phone Call        Go to [facility] for 24 hrs. Take Phenergan 25mg  by mouth every 6 hrs as needed nausea. 20/0RF. Come  in if no impr. Follow-up by: Shaune Leeks MD,  April 05, 2008 3:14 PM  Additional Follow-up for Phone Call Additional follow up Details #1::        Pt said he would prefer suppositories, so I called those in instead.  ...........Marland KitchenLowella Petties  April 05, 2008 4:51 PM Fine! Additional Follow-up by: Shaune Leeks MD,  April 05, 2008 5:01 PM

## 2010-05-09 NOTE — Letter (Signed)
Summary: Dr Jim Desanctis Rosenbower's Office Note   Dr Jim Desanctis Rosenbower's Office Note   Imported By: Roderic Ovens 03/16/2010 14:09:50  _____________________________________________________________________  External Attachment:    Type:   Image     Comment:   External Document

## 2010-05-09 NOTE — Progress Notes (Signed)
Summary: refill pt out medication   Phone Note Refill Request Message from:  Patient on February 08, 2010 8:36 AM  Refills Requested: Medication #1:  CARVEDILOL 6.25 MG  TABS 1 tab twice a day [phone] pt is out medication need it today  Initial call taken by: Judie Grieve,  February 08, 2010 8:36 AM  Follow-up for Phone Call       Follow-up by: Judithe Modest CMA,  February 08, 2010 3:01 PM    Prescriptions: CARVEDILOL 6.25 MG  TABS (CARVEDILOL) 1 tab twice a day  #30 x 11   Entered by:   Judithe Modest CMA   Authorized by:   Rollene Rotunda, MD, [facility]   Signed by:   Judithe Modest CMA on 02/08/2010   Method used:   [facility]        [id number]       [address]       [place], [place]  [address]       Ph: [phone] or [phone]       Fax: [phone]   RxID:   [id number]

## 2010-05-09 NOTE — Progress Notes (Signed)
Summary: RN to Call  Phone Note Call from Patient Call back at Home Phone [phone]   Caller: Patient Call For: RN Summary of Call: Patient would like RN to call him concerning his blood work Initial call taken by: [place],  April 20, 2009 11:07 AM  Follow-up for Phone Call        needs more samples of pradaxa. coming in tomorrow Follow-up by: Charlena Cross, RN, BSN,  April 20, 2009 11:36 AM

## 2010-05-09 NOTE — Cardiovascular Report (Signed)
Summary: Office Visit Remote   Office Visit Remote   Imported By: Roderic Ovens 06/22/2009 11:15:38  _____________________________________________________________________  External Attachment:    Type:   Image     Comment:   External Document

## 2010-05-09 NOTE — Procedures (Signed)
Summary: [facility] / ABDOMINAL PAIN & PROBLEMS DIGESTING FOOD, DIAR  [facility] / ABDOMINAL PAIN & PROBLEMS DIGESTING FOOD, DIARRHEA & WEIGHT LOSS / DR. Molly Maduro ELLIOTT   Imported By: Carin Primrose 10/21/2008 11:58:05  _____________________________________________________________________  External Attachment:    Type:   Image     Comment:   External Document

## 2010-05-09 NOTE — Progress Notes (Signed)
Summary: MEDS  Phone Note Call from Patient Call back at Home Phone [phone] Call back at [phone]   Caller: SELF Call For: [name] Summary of Call: PLEASE CALL PT-HE HAS QUESTIONS ABOUT MEDS Initial call taken by: Harlon Flor,  April 07, 2009 12:28 PM  Follow-up for Phone Call        questions answered.  pt is somewhat concerned about co pay for medication but states that he will handle that when it comes time. Follow-up by: Charlena Cross, RN, BSN,  April 07, 2009 2:50 PM

## 2010-05-09 NOTE — Medication Information (Signed)
Summary: CCR/AMD  Anticoagulant Therapy  Managed by: Charlena Cross, RN, BSN Referring MD: Tonette Lederer, Fayrene Fearing PCP: Shaune Leeks MD Supervising MD: Shirlee Latch MD, [place] Indication 1: Atrial Fibrillation [place] Site:  INR POC 2.0  Dietary changes: no    Health status changes: no    Bleeding/hemorrhagic complications: no    Recent/future hospitalizations: no    Any changes in medication regimen? no    Recent/future dental: no  Any missed doses?: no       Is patient compliant with meds? yes       Allergies: 1)  ! Penicillin 2)  ! Terramycin 3)  ! Prilosec 4)  ! Biaxin 5)  ! Pepcid 6)  ! * Zegerid 7)  ! * Ranitidine  Anticoagulation Management History:      The patient is taking warfarin and comes in today for a routine follow up visit.  Positive risk factors for bleeding include an age of 75 years or older.  The bleeding index is 'intermediate risk'.  Positive CHADS2 values include History of CHF and Age > 41 years old.  Anticoagulation responsible provider: Shirlee Latch MD, [place].  INR POC: 2.0.    Anticoagulation Management Assessment/Plan:      The patient's current anticoagulation dose is Warfarin sodium 5 mg tabs: Use as directed by Anticoagulation Clinic.  The target INR is 2.0-3.0.  The next INR is due 03/04/2009.  Anticoagulation instructions were given to patient.  Results were reviewed/authorized by Charlena Cross, RN, BSN.  He was notified by Charlena Cross, RN, BSN.         Prior Anticoagulation Instructions: COUMADIN 10MG  TODAY THEN COUMADIN 5MG  DAILY EXCEPT 2.5MG  ON SAT  Current Anticoagulation Instructions: The patient is to continue with the same dose of coumadin.  This dosage includes: coumadin 5mg  daily with 2.5 mg on Sat.

## 2010-05-11 NOTE — Progress Notes (Signed)
Summary: PT/INR  ---- Converted from flag ---- ---- 03/22/2010 8:55 AM, Lanny Hurst RN wrote: attempted to call pt to notify cancelled. called [facility] and cancelled admit for 03/27/10  ---- 03/21/2010 5:12 PM, Dennis Bast, RN, BSN wrote: Per Dr Ladona Ridgel  He will need 3 INR's >2.0 prior to going in for Tikosyn load Tresa Endo  ---- 03/21/2010 9:20 AM, Lanny Hurst RN wrote: Pt was scheduled for Tikosyn load on 03/27/10, INR subtherapeutic at 1.6 yesterday, should we cancel admit or would you like to proceed with TEE? ------------------------------ Phone Note Outgoing Call   Call placed by: Lanny Hurst RN,  March 23, 2010 12:45 PM Summary of Call: Pt notified INR subtherapeutic prior to Tikosyn load and that appt for admit 03/27/10 and will need 3 consecutive therapeutic INR's prior to admit. Pt spoke to [place], [place] to dose coumadin and for followup. Initial call taken by: Lanny Hurst RN,  March 23, 2010 12:48 PM

## 2010-05-11 NOTE — Medication Information (Signed)
Summary: rov/ewj  Anticoagulant Therapy  Managed by: Cloyde Reams, RN, BSN Referring MD: Tonette Lederer, Fayrene Fearing PCP: Eustaquio Boyden  MD Supervising MD: Shirlee Latch MD, Kalila Adkison Indication 1: Atrial Fibrillation Lab Used:  [facility] [place] Site: [place] INR POC 2.1 INR RANGE 2.0-3.0  Dietary changes: no    Health status changes: no    Bleeding/hemorrhagic complications: no    Recent/future hospitalizations: no    Any changes in medication regimen? no    Recent/future dental: no  Any missed doses?: no       Is patient compliant with meds? yes       Allergies: 1)  ! Penicillin 2)  ! Terramycin 3)  ! Prilosec 4)  ! Biaxin 5)  ! Pepcid 6)  ! * Zegerid 7)  ! * Ranitidine  Anticoagulation Management History:      The patient is taking warfarin and comes in today for a routine follow up visit.  Positive risk factors for bleeding include an age of 75 years or older.  The bleeding index is 'intermediate risk'.  Positive CHADS2 values include History of CHF and Age > 41 years old.  His last INR was 1.66.  Anticoagulation responsible provider: Shirlee Latch MD, Doralene Glanz.  INR POC: 2.1.  Cuvette Lot#: [id number].  Exp: 03/2011.    Anticoagulation Management Assessment/Plan:      The patient's current anticoagulation dose is Warfarin sodium 5 mg tabs: Use as directed by Anticoagulation Clinic, Warfarin sodium 5 mg tabs: Use as directed by Anticoagulation Clinic.  The target INR is 2.0-3.0.  The next INR is due 04/19/2010.  Anticoagulation instructions were given to patient.  Results were reviewed/authorized by Cloyde Reams, RN, BSN.  He was notified by Cloyde Reams RN.         Prior Anticoagulation Instructions: INR 2.3  Continue on same dosage 1.5 tablets daily except 1 tablet on Sundays, Tuesdays, and Thursdays.  Recheck in 1 week.   Current Anticoagulation Instructions: INR 2.1  Start taking 1.5 tablets daily except 1 tablet on Tuesdays and Thursdays.  Recheck in 1 week.

## 2010-05-11 NOTE — Progress Notes (Signed)
Summary: Calling about a bed  Phone Note Call from Patient Call back at Home Phone [phone]   Caller: Patient Summary of Call: Pt calling about a bed at hospital for a procedure Initial call taken by: Judie Grieve,  April 26, 2010 11:46 AM  Follow-up for Phone Call        Tried to reach pt.  He has already been admitted to [facility].  Weston Brass PharmD  April 26, 2010 2:34 PM

## 2010-05-11 NOTE — Assessment & Plan Note (Signed)
Summary: f1y   Visit Type:  Follow-up Primary Provider:  Eustaquio Boyden  [name]  CC:  "doing well". Denies SOB and chest pain.Marland Kitchen  History of Present Illness: [name] returns today for followup.  He is a pleasant 77 yo man with a longstanding DCM, CHF, and atrial flutter.  He underwent EPS several months ago and was found to have a left atrial flutter.  He was cardioverted back to NSR.  He has been out of rhythm for about 3 months.  He notes dyspnea with exertion (class 2 from a baseline of class 1) and peripheral edema.  No c/p. He does not have palpitations but he is pacing only about 2/3 of the time.  Current Medications (verified): 1)  Warfarin Sodium 5 Mg Tabs (Warfarin Sodium) .... Use As Directed By Anticoagulation Clinic 2)  Enalapril Maleate 5 Mg  Tabs (Enalapril Maleate) .Marland Kitchen.. 1 By Mouth Two Times A Day 3)  Carvedilol 6.25 Mg  Tabs (Carvedilol) .Marland Kitchen.. 1 Tab Twice A Day 4)  Furosemide 10 Mg/ml Soln (Furosemide) .... Once Daily 5)  Valium 5 Mg  Tabs (Diazepam) .... 1/4 Tab At Bedtime As Needed 6)  Carafate 1 Gm Tabs (Sucralfate) .... 1/2 Tablet By Mouth As Needed 7)  Nasarel 29 Mcg/act  Soln (Flunisolide) .Marland Kitchen.. 1 Spray Every Day 8)  Singulair 10 Mg  Tabs (Montelukast Sodium) .Marland Kitchen.. 1 Tab Every Day As Needed 9)  Astepro 0.15 % Soln (Azelastine Hcl) .Marland Kitchen.. 1 Spray Each Nostril As Needed 10)  Advair Diskus 100-50 Mcg/dose Aepb (Fluticasone-Salmeterol) .Marland Kitchen.. 1 Inhalation Twice A Day By Mouth 11)  Mucus Relief 400 Mg  Tabs (Guaifenesin) .Marland Kitchen.. 1 Tab Every Day As Needed 12)  Equate Allergy Relief 10mg  .... 1 Tab Every Day (Claritin Generic) 13)  Docusate Sodium 100 Mg Caps (Docusate Sodium) .... Take One Pill Daily As Needed Constipation, Hold For Diarrhea 14)  Warfarin Sodium 5 Mg Tabs (Warfarin Sodium) .... Use As Directed By Anticoagulation Clinic  Allergies (verified): 1)  ! Penicillin 2)  ! Terramycin 3)  ! Prilosec 4)  ! Biaxin 5)  ! Pepcid 6)  ! * Zegerid 7)  ! * Ranitidine  Past  History:  Past Medical History: Last updated: 01/22/2010 NICM (ICD 425.9) with EF 35%, ICD and pacemaker in place (ICD V45.02) - (06/2004) Atrial flutter on coumadin HLD (ICD-272.4) - (05/2000) COPD via CXR (ICD-496) - (07/2003) LBBB (ICD-426.3) GERD (ICD-530.81) - via egd with h.hernia, acute gastritis, duodenitis, s/p stricture dilation (12/2004) (Dr. Markham Jordan) GAS/BLOATING (ICD-787.3) PORPHYRIA (ICD-277.1) IBS (ICD-564.1) BPH (ICD-600.00) - (1995) DIVERTICULITIS, HX OF with severe diverticulosis on colonsocpy (ICD-V12.79) - (01/1998) ALLERGIC RHINITIS (ICD-477.9) - and asthma per Dr. [place] Callas Admission to [facility] with GI bleed thought 2/2 int hemorrhoids (12/2009)  Past Surgical History: Last updated: 01/22/2010 PENILE PROSTHESIS ([place]) 2007 CHOLEYCYSTECTOMY GOITER REMOVAL NASAL SURGERY(:1971) COLONOSCOPY;  DIVERTIC,SPLENIC, HEPATIC FLEURE ONLY:(01/1998) EGD STRICTURE,SLIDING H/H. ; GERD:(01/1998)  DILITATION:(02/18/1998) EGD STRICTURE ; GASTRITIS, H.H. , GERD NO DILITATION (Dr. Marina Goodell) (04/08/2001) HERNIA REPAIR:(DR. Daphine Deutscher) (01/30/2002) PLUMONARY EVALUATION ([facility]) CHRONIC CONGESTIVE  SYMPTOMS:(04/2002) CATHERIZATION, SEVERE NONISCHEMIA CARDIOMYOPATHY, EF 25 TO 30 %:(06/22/2004) ECHO EF25-30% ,MOD LVH; LA SEVERE DILATATION; MILD A.R.; IRTR:(07/13/2004) EGD, STRICTURE; GASTRITIS, DUODENITIS, GERD:(12/22/2004) BIVENTRICULAR ICD IMPLANTATION; 2ND TO CHF AND NON-ISCHEMIC CARDIOMYOPATHY:(12/02/2006) ECHO HYPOKINESIS POSTERIOR WALL, EF 35%, MILD AR:(11/27/2006) EGD REFLUX  ERYTHEM. DUOD. (DR ELLIOTT) (10/21/2008) COLONOSCOPY = ABORTED: DIVERTICS, INT HEMMS (DR ELLIOTT) (10/21/2008) [facility] AFLUTTER CARDIOVERTED 02/22/2009  Risk Factors: Alcohol Use: 0 (12/08/2008) Caffeine Use: 1 (12/08/2008) Exercise: no (12/08/2008)  Risk Factors:  Smoking Status: never (12/08/2008) Passive Smoke Exposure: no (12/08/2008)  Review of Systems       The patient complains of dyspnea on exertion and  peripheral edema.  The patient denies chest pain and syncope.    Vital Signs:  Patient profile:   77 year old male Height:      67 inches Weight:      148.50 pounds BMI:     23.34 Pulse rate:   76 / minute BP sitting:   102 / 60  (left arm) Cuff size:   regular  Vitals Entered By: [name] (March 20, 2010 10:15 AM)  Physical Exam  General:  Well-developed,well-nourished,in no acute distress; alert,appropriate and cooperative throughout examination. Head:  Normocephalic and atraumatic without obvious abnormalities. No apparent alopecia or balding. Sinuses nontender. Eyes:  Conjunctiva clear bilaterally.  Mouth:  Oral mucosa and oropharynx without lesions or exudates.  Teeth in good repair. Neck:  No deformities, masses, or tenderness noted. Chest Wall:  Well healed ICD pocket Lungs:  Normal respiratory effort, chest expands symmetrically. Lungs are clear to auscultation, no crackles or wheezes. Heart:  slight irregular rhythm.  S1 and S2 normal without gallop, murmur, click, rub or other extra sounds. Abdomen:  Bowel sounds positive,abdomen soft and non-tender without masses, organomegaly or hernias noted. Msk:  No deformity or scoliosis noted of thoracic or lumbar spine.   Pulses:  2+ rad pulses Extremities:  no pedal edema.  L big toe medial slight induration, no fluctuance, no pus.  + dry blood. Neurologic:  Alert and oriented x 3.    ICD Specifications Following [name]:  [name] Bunting, [name]     ICD Vendor:  [employer]     ICD Model Number:  7299     ICD Serial Number:  [id number] ICD DOI:  08/31/2005     ICD Implanting [name]:  [name] Bunting, [name]  Lead 1:    Location: RA     DOI: 08/31/2005     Model #: [id number]     Serial #: [id number]     Status: active Lead 2:    Location: RV     DOI: 08/31/2005     Model #: [id number]     Serial #: [id number]     Status: active Lead 3:    Location: LV     DOI: 08/31/2005     Model #: [id number]     Serial #: [id number]     Status: active  Indications::   NICM, CHF   ICD Follow Up Remote Check?  No Battery Voltage:  2.64 V     Charge Time:  11.97 seconds     Underlying rhythm:  A-flutter ICD Dependent:  Yes       ICD Device Measurements Atrium:  Amplitude: 1.5 mV, Impedance: 472 ohms,  Right Ventricle:  Amplitude: 7.2 mV, Impedance: 464 ohms, Threshold: 1.0 V at 0.3 msec Left Ventricle:  Impedance: 376 ohms, Threshold: 1.0 V at 0.2 msec Shock Impedance: 36/47 ohms   Episodes MS Episodes:  1     Percent Mode Switch:  100%     Coumadin:  Yes Shock:  0     ATP:  0     Nonsustained:  0     Atrial Pacing:  0.2%     Ventricular Pacing:  62.7%  Brady Parameters Mode DDD     Lower Rate Limit:  60     Upper Rate Limit 130 PAV 130     Sensed AV Delay:  100  Tachy Zones VF:  200     VT:  240     VT1:  176     Next Remote Date:  06/15/2010     Next Cardiology Appt Due:  03/10/2011 Tech Comments:  No parameter changes.  Device nearing ERI.  A-fib, + coumadin.  Optivol and thoracic impedance abnomal 10/27-11/2.    Carelink transmissions every 3 months.  ROV 1 year with Dr.Juston Goheen in [place]. [name] Harm, [name]  March 20, 2010 10:24 AM  [name] Comments:  Agree with above.  Impression & Recommendations:  Problem # 1:  ATRIAL FLUTTER (ICD-427.32) While he does not have palpitations, his CHF is slightly worse.  I have recommended admit for Tikosyn initiation. The indications, risks, benefits, or the admit have been discussed and he wishes to proceed. His updated medication list for this problem includes:    Warfarin Sodium 5 Mg Tabs (Warfarin sodium) ..... Use as directed by anticoagulation clinic    Carvedilol 6.25 Mg Tabs (Carvedilol) .Marland Kitchen... 1 tab twice a day  Orders: T-Basic Metabolic Panel [phone]) T-Magnesium [phone]) T-PTT [phone]) T-Protime, Auto [phone])  Problem # 2:  ICD - IN SITU (ICD-V45.02) His device is working normally.  Will recheck in several months.  Problem # 3:  CONGESTIVE HEART FAILURE  (ICD-428.0) His symptoms are class 2. He will continue his current meds, will admit for tikosyn. His updated medication list for this problem includes:    Warfarin Sodium 5 Mg Tabs (Warfarin sodium) ..... Use as directed by anticoagulation clinic    Enalapril Maleate 5 Mg Tabs (Enalapril maleate) .Marland Kitchen... 1 by mouth two times a day    Carvedilol 6.25 Mg Tabs (Carvedilol) .Marland Kitchen... 1 tab twice a day    Furosemide 10 Mg/ml Soln (Furosemide) ..... Once daily

## 2010-05-11 NOTE — Medication Information (Signed)
Summary: Coumadin Clinic  Anticoagulant Therapy  Managed by: Cloyde Reams, RN, BSN Referring MD: Tonette Lederer, Fayrene Fearing PCP: Eustaquio Boyden  MD Supervising MD: Mariah Milling Indication 1: Atrial Fibrillation Lab Used:  [facility] [place] Site: [place] INR POC 2.8 INR RANGE 2.0-3.0  Dietary changes: no    Health status changes: no    Bleeding/hemorrhagic complications: no    Recent/future hospitalizations: no    Any changes in medication regimen? no    Recent/future dental: no  Any missed doses?: no       Is patient compliant with meds? yes      Comments: Pt pending Tikosyn start.  3 INR's greater than 2.0 ready for Tikosyn start.    Allergies: 1)  ! Penicillin 2)  ! Terramycin 3)  ! Prilosec 4)  ! Biaxin 5)  ! Pepcid 6)  ! * Zegerid 7)  ! * Ranitidine  Anticoagulation Management History:      The patient is taking warfarin and comes in today for a routine follow up visit.  Positive risk factors for bleeding include an age of 75 years or older.  The bleeding index is 'intermediate risk'.  Positive CHADS2 values include History of CHF and Age > 35 years old.  His last INR was 1.66.  Anticoagulation responsible provider: gollan.  INR POC: 2.8.  Cuvette Lot#: [id number].  Exp: 05/2011.    Anticoagulation Management Assessment/Plan:      The patient's current anticoagulation dose is Warfarin sodium 5 mg tabs: Use as directed by Anticoagulation Clinic, Warfarin sodium 5 mg tabs: Use as directed by Anticoagulation Clinic.  The target INR is 2.0-3.0.  The next INR is due 04/26/2010.  Anticoagulation instructions were given to patient.  Results were reviewed/authorized by Cloyde Reams, RN, BSN.  He was notified by Cloyde Reams RN.         Prior Anticoagulation Instructions: INR 2.1  Start taking 1.5 tablets daily except 1 tablet on Tuesdays and Thursdays.  Recheck in 1 week.    Current Anticoagulation Instructions: INR 2.8  Continue on same dosage 1.5 tablets  daily except 1 tablet on Tuesdays and Thursdays.  Recheck in 1 week.

## 2010-05-11 NOTE — Medication Information (Signed)
Summary: rov/ewj  Anticoagulant Therapy  Managed by: Cloyde Reams, RN, BSN Referring MD: Tonette Lederer, Fayrene Fearing PCP: Eustaquio Boyden  MD Supervising MD: Mariah Milling Indication 1: Atrial Fibrillation Lab Used:  [facility] [place] Site: [place] INR POC 2.3 INR RANGE 2.0-3.0  Dietary changes: no    Health status changes: no    Bleeding/hemorrhagic complications: no    Recent/future hospitalizations: no    Any changes in medication regimen? no    Recent/future dental: no  Any missed doses?: no       Is patient compliant with meds? yes       Allergies: 1)  ! Penicillin 2)  ! Terramycin 3)  ! Prilosec 4)  ! Biaxin 5)  ! Pepcid 6)  ! * Zegerid 7)  ! * Ranitidine  Anticoagulation Management History:      The patient is taking warfarin and comes in today for a routine follow up visit.  Positive risk factors for bleeding include an age of 75 years or older.  The bleeding index is 'intermediate risk'.  Positive CHADS2 values include History of CHF and Age > 60 years old.  His last INR was 1.66.  Anticoagulation responsible provider: [facility].  INR POC: 2.3.  Cuvette Lot#: [id number].  Exp: 03/2011.    Anticoagulation Management Assessment/Plan:      The patient's current anticoagulation dose is Warfarin sodium 5 mg tabs: Use as directed by Anticoagulation Clinic, Warfarin sodium 5 mg tabs: Use as directed by Anticoagulation Clinic.  The target INR is 2.0-3.0.  The next INR is due 04/12/2010.  Anticoagulation instructions were given to patient.  Results were reviewed/authorized by Cloyde Reams, RN, BSN.  He was notified by Cloyde Reams RN.         Prior Anticoagulation Instructions: INR 1.9  Start taking 1.5 tablets daily except 1 tablet on Tuesdays, Thursdays, and Sundays.  Recheck in 1 week.   Current Anticoagulation Instructions: INR 2.3  Continue on same dosage 1.5 tablets daily except 1 tablet on Sundays, Tuesdays, and Thursdays.  Recheck in 1 week.

## 2010-05-11 NOTE — Medication Information (Signed)
Summary: CCR s/p cardioversion A-Fib  Anticoagulant Therapy  Managed by: Cloyde Reams, RN, BSN Referring MD: Tonette Lederer, Fayrene Fearing PCP: Eustaquio Boyden  MD Supervising MD: Gala Romney MD, [name] Indication 1: Atrial Fibrillation Lab Used:  [facility] [place] Site: [place] INR POC 3.0 INR RANGE 2.0-3.0  Dietary changes: no    Health status changes: yes       Details: Frequent BM with start of Tikosyn.    Bleeding/hemorrhagic complications: no    Recent/future hospitalizations: no    Any changes in medication regimen? yes       Details: Started on Tikosyn and DCCV.   Recent/future dental: no  Any missed doses?: no       Is patient compliant with meds? yes       Allergies: 1)  ! Penicillin 2)  ! Terramycin 3)  ! Prilosec 4)  ! Biaxin 5)  ! Pepcid 6)  ! * Zegerid 7)  ! * Ranitidine  Anticoagulation Management History:      The patient is taking warfarin and comes in today for a routine follow up visit.  Positive risk factors for bleeding include an age of 75 years or older.  The bleeding index is 'intermediate risk'.  Positive CHADS2 values include History of CHF and Age > 62 years old.  His last INR was 3.5 ratio.  Anticoagulation responsible provider: Sherice Ijames MD, [name].  INR POC: 3.0.  Cuvette Lot#: [id number].  Exp: 05/2011.    Anticoagulation Management Assessment/Plan:      The patient's current anticoagulation dose is Warfarin sodium 5 mg tabs: Use as directed by Anticoagulation Clinic, Warfarin sodium 5 mg tabs: Use as directed by Anticoagulation Clinic.  The target INR is 2.0-3.0.  The next INR is due 05/17/2010.  Anticoagulation instructions were given to patient.  Results were reviewed/authorized by Cloyde Reams, RN, BSN.  He was notified by Cloyde Reams RN.         Prior Anticoagulation Instructions: INR 2.8  Continue on same dosage 1.5 tablets daily except 1 tablet on Tuesdays and Thursdays.  Recheck in 1 week.     Current Anticoagulation  Instructions: INR 3.0  Start taking 1.5 tablets daily except 1 tablet on Tuesdays, Thursdays, and Saturdays.  Recheck in 2 weeks.

## 2010-05-11 NOTE — Medication Information (Signed)
Summary: rov/tm  Anticoagulant Therapy  Managed by: Cloyde Reams, RN, BSN Referring MD: Tonette Lederer, Fayrene Fearing PCP: Eustaquio Boyden  MD Supervising MD: Mariah Milling Indication 1: Atrial Fibrillation Lab Used:  [facility] [place] Site: [place] INR POC 1.9 INR RANGE 2.0-3.0  Dietary changes: no    Health status changes: no    Bleeding/hemorrhagic complications: no    Recent/future hospitalizations: no    Any changes in medication regimen? no    Recent/future dental: no  Any missed doses?: no       Is patient compliant with meds? yes       Allergies: 1)  ! Penicillin 2)  ! Terramycin 3)  ! Prilosec 4)  ! Biaxin 5)  ! Pepcid 6)  ! * Zegerid 7)  ! * Ranitidine  Anticoagulation Management History:      The patient is taking warfarin and comes in today for a routine follow up visit.  Positive risk factors for bleeding include an age of 76 years or older.  The bleeding index is 'intermediate risk'.  Positive CHADS2 values include History of CHF and Age > 19 years old.  His last INR was 1.66.  Anticoagulation responsible Essense Bousquet: Gollan.  INR POC: 1.9.  Cuvette Lot#: [id number].  Exp: 03/2011.    Anticoagulation Management Assessment/Plan:      The patient's current anticoagulation dose is Warfarin sodium 5 mg tabs: Use as directed by Anticoagulation Clinic, Warfarin sodium 5 mg tabs: Use as directed by Anticoagulation Clinic.  The target INR is 2.0-3.0.  The next INR is due 04/05/2010.  Anticoagulation instructions were given to patient.  Results were reviewed/authorized by Cloyde Reams, RN, BSN.  He was notified by Cloyde Reams RN.         Prior Anticoagulation Instructions: INR 1.66  Take 2 tablets today, then resume same dosage 1 tablet daily except 1.5 tablets on Mondays, Wednesdays, and Fridays.  Pt pending Tikosyn load once 3 consecutive INR's above 2.0.  Recheck in 1 week.    Current Anticoagulation Instructions: INR 1.9  Start taking 1.5 tablets daily  except 1 tablet on Tuesdays, Thursdays, and Sundays.  Recheck in 1 week.

## 2010-05-17 ENCOUNTER — Encounter (INDEPENDENT_AMBULATORY_CARE_PROVIDER_SITE_OTHER): Payer: Medicare Other

## 2010-05-17 ENCOUNTER — Encounter: Payer: Self-pay | Admitting: Cardiovascular Disease

## 2010-05-17 DIAGNOSIS — Z7901 Long term (current) use of anticoagulants: Secondary | ICD-10-CM

## 2010-05-17 DIAGNOSIS — I4891 Unspecified atrial fibrillation: Secondary | ICD-10-CM

## 2010-05-17 LAB — CONVERTED CEMR LAB: POC INR: 2.4

## 2010-05-17 NOTE — Procedures (Signed)
Summary: 2:00 APPT/AMD   Visit Type:  Follow-up Primary Provider:  Eustaquio Boyden  MD   History of Present Illness: [name] returns today for followup. He is a pleasant 77 yo man with a h/o atrial fib, DCM, CHF, and is s/p ICD implant.  He developed atrial fibrillation which has been persistent and he was admitted several weeks ago for coumadin loading. He underwent successful DCCV, restoring NSR.  Since discharge he has done well. He notes that he is in rhythm and that his fluid status is improved. No c/p or sob.  Current Medications (verified): 1)  Warfarin Sodium 5 Mg Tabs (Warfarin Sodium) .... Use As Directed By Anticoagulation Clinic 2)  Enalapril Maleate 5 Mg  Tabs (Enalapril Maleate) .Marland Kitchen.. 1 By Mouth Two Times A Day 3)  Carvedilol 6.25 Mg  Tabs (Carvedilol) .Marland Kitchen.. 1 Tab Twice A Day 4)  Furosemide 10 Mg/ml Soln (Furosemide) .... Once Daily 5)  Valium 5 Mg  Tabs (Diazepam) .... 1/4 Tab At Bedtime As Needed 6)  Carafate 1 Gm Tabs (Sucralfate) .... 1/2 Tablet By Mouth As Needed 7)  Nasarel 29 Mcg/act  Soln (Flunisolide) .Marland Kitchen.. 1 Spray Every Day 8)  Singulair 10 Mg  Tabs (Montelukast Sodium) .Marland Kitchen.. 1 Tab Every Day As Needed 9)  Astepro 0.15 % Soln (Azelastine Hcl) .Marland Kitchen.. 1 Spray Each Nostril As Needed 10)  Advair Diskus 100-50 Mcg/dose Aepb (Fluticasone-Salmeterol) .Marland Kitchen.. 1 Inhalation Twice A Day By Mouth 11)  Mucus Relief 400 Mg  Tabs (Guaifenesin) .Marland Kitchen.. 1 Tab Every Day As Needed 12)  Equate Allergy Relief 10mg  .... 1 Tab Every Day (Claritin Generic) 13)  Docusate Sodium 100 Mg Caps (Docusate Sodium) .... Take One Pill Daily As Needed Constipation, Hold For Diarrhea 14)  Warfarin Sodium 5 Mg Tabs (Warfarin Sodium) .... Use As Directed By Anticoagulation Clinic 15)  Tikosyn 500 Mcg Caps (Dofetilide) .... One Tablet Two Times A Day  Allergies (verified): 1)  ! Penicillin 2)  ! Terramycin 3)  ! Prilosec 4)  ! Biaxin 5)  ! Pepcid 6)  ! * Zegerid 7)  ! * Ranitidine  Past  History:  Past Medical History: Last updated: 01/22/2010 NICM (ICD 425.9) with EF 35%, ICD and pacemaker in place (ICD V45.02) - (06/2004) Atrial flutter on coumadin HLD (ICD-272.4) - (05/2000) COPD via CXR (ICD-496) - (07/2003) LBBB (ICD-426.3) GERD (ICD-530.81) - via egd with h.hernia, acute gastritis, duodenitis, s/p stricture dilation (12/2004) (Dr. Markham Jordan) GAS/BLOATING (ICD-787.3) PORPHYRIA (ICD-277.1) IBS (ICD-564.1) BPH (ICD-600.00) - (1995) DIVERTICULITIS, HX OF with severe diverticulosis on colonsocpy (ICD-V12.79) - (01/1998) ALLERGIC RHINITIS (ICD-477.9) - and asthma per Dr. [place] Callas Admission to [facility] with GI bleed thought 2/2 int hemorrhoids (12/2009)  Past Surgical History: Last updated: 01/22/2010 PENILE PROSTHESIS ([place]) 2007 CHOLEYCYSTECTOMY GOITER REMOVAL NASAL SURGERY(:1971) COLONOSCOPY;  DIVERTIC,SPLENIC, HEPATIC FLEURE ONLY:(01/1998) EGD STRICTURE,SLIDING H/H. ; GERD:(01/1998)  DILITATION:(02/18/1998) EGD STRICTURE ; GASTRITIS, H.H. , GERD NO DILITATION (Dr. Marina Goodell) (04/08/2001) HERNIA REPAIR:(DR. Daphine Deutscher) (01/30/2002) PLUMONARY EVALUATION ([facility]) CHRONIC CONGESTIVE  SYMPTOMS:(04/2002) CATHERIZATION, SEVERE NONISCHEMIA CARDIOMYOPATHY, EF 25 TO 30 %:(06/22/2004) ECHO EF25-30% ,MOD LVH; LA SEVERE DILATATION; MILD A.R.; IRTR:(07/13/2004) EGD, STRICTURE; GASTRITIS, DUODENITIS, GERD:(12/22/2004) BIVENTRICULAR ICD IMPLANTATION; 2ND TO CHF AND NON-ISCHEMIC CARDIOMYOPATHY:(12/02/2006) ECHO HYPOKINESIS POSTERIOR WALL, EF 35%, MILD AR:(11/27/2006) EGD REFLUX  ERYTHEM. DUOD. (DR ELLIOTT) (10/21/2008) COLONOSCOPY = ABORTED: DIVERTICS, INT HEMMS (DR ELLIOTT) (10/21/2008) [facility] AFLUTTER CARDIOVERTED 02/22/2009  Review of Systems  The patient denies chest pain, syncope, dyspnea on exertion, and peripheral edema.    Vital Signs:  Patient profile:  77 year old male Height:      67 inches Weight:      148 pounds BMI:     23.26 Pulse rate:   61 / minute BP sitting:   98 / 64   (left arm) Cuff size:   regular  Vitals Entered By: Bishop Dublin, CMA (May 08, 2010 1:56 PM)  Physical Exam  General:  Well-developed,well-nourished,in no acute distress; alert,appropriate and cooperative throughout examination. Head:  Normocephalic and atraumatic without obvious abnormalities. No apparent alopecia or balding. Sinuses nontender. Eyes:  Conjunctiva clear bilaterally.  Mouth:  Oral mucosa and oropharynx without lesions or exudates.  Teeth in good repair. Neck:  No deformities, masses, or tenderness noted. Chest Wall:  Well healed ICD pocket Lungs:  Normal respiratory effort, chest expands symmetrically. Lungs are clear to auscultation, no crackles or wheezes. Heart:  slight irregular rhythm.  S1 and S2 normal without gallop, murmur, click, rub or other extra sounds. Abdomen:  Bowel sounds positive,abdomen soft and non-tender without masses, organomegaly or hernias noted. Msk:  No deformity or scoliosis noted of thoracic or lumbar spine.   Pulses:  2+ rad pulses Extremities:  no pedal edema.  L big toe medial slight induration, no fluctuance, no pus.  + dry blood. Neurologic:  Alert and oriented x 3.   EKG  Procedure date:  05/08/2010  Findings:      Normal sinus rhythm with rate of: 61.  Atrium and ventricle are paced.     ICD Specifications Following MD:  Lewayne Bunting, MD     ICD Vendor:  [employer]     ICD Model Number:  7299     ICD Serial Number:  [id number] ICD DOI:  08/31/2005     ICD Implanting MD:  Lewayne Bunting, MD  Lead 1:    Location: RA     DOI: 08/31/2005     Model #: [id number]     Serial #: [id number]     Status: active Lead 2:    Location: RV     DOI: 08/31/2005     Model #: [id number]     Serial #: [id number]     Status: active Lead 3:    Location: LV     DOI: 08/31/2005     Model #: [id number]     Serial #: [id number]     Status: active  Indications::  NICM, CHF   ICD Follow Up Remote Check?  No Battery Voltage:  2.64 V     Charge Time:  11.97 seconds      Underlying rhythm:  Huston Foley ICD Dependent:  No       ICD Device Measurements Atrium:  Amplitude: 4.0 mV, Impedance: 496 ohms, Threshold: 1.0 V at 0.2 msec Right Ventricle:  Amplitude: 6.5 mV, Impedance: 488 ohms, Threshold: 1.0 V at 0.2 msec Left Ventricle:  Impedance: 408 ohms, Threshold: 1.0 V at 0.2 msec  Episodes MS Episodes:  0     Percent Mode Switch:  0     Coumadin:  Yes Shock:  0     ATP:  0     Nonsustained:  0     Atrial Pacing:  78.6%     Ventricular Pacing:  100%  Brady Parameters Mode DDDR     Lower Rate Limit:  60     Upper Rate Limit 130 PAV 130     Sensed AV Delay:  100  Tachy Zones VF:  200     VT:  240  VT1:  176     Next Cardiology Appt Due:  07/09/2010 Tech Comments:  Rate response on today. Underlying rhythm sinus brady.  No parameter changes.  Optivol and thoracic impedance normal.  6949 lead stable.  ROV 3 months with Dr. Ladona Ridgel in [place]. Altha Harm, LPN  May 08, 2010 2:12 PM  MD Comments:  Agree with above.  Impression & Recommendations:  Problem # 1:  ATRIAL FLUTTER (ICD-427.32) He is maintaining NSR after starting Tikosyn. Will follow up in three months. His updated medication list for this problem includes:    Warfarin Sodium 5 Mg Tabs (Warfarin sodium) ..... Use as directed by anticoagulation clinic    Carvedilol 6.25 Mg Tabs (Carvedilol) .Marland Kitchen... 1 tab twice a day    Tikosyn 500 Mcg Caps (Dofetilide) ..... One tablet two times a day  Problem # 2:  CONGESTIVE HEART FAILURE (ICD-428.0) HIS symptoms are currently class 2. Will folllow. His updated medication list for this problem includes:    Warfarin Sodium 5 Mg Tabs (Warfarin sodium) ..... Use as directed by anticoagulation clinic    Enalapril Maleate 5 Mg Tabs (Enalapril maleate) .Marland Kitchen... 1 by mouth two times a day    Carvedilol 6.25 Mg Tabs (Carvedilol) .Marland Kitchen... 1 tab twice a day    Furosemide 10 Mg/ml Soln (Furosemide) ..... Once daily    Tikosyn 500 Mcg Caps (Dofetilide) ..... One tablet two  times a day  Problem # 3:  ICD - IN SITU (ICD-V45.02) HIs device is working normally. Will recheck in several months.  Patient Instructions: 1)  Your physician recommends that you schedule a follow-up appointment in: 3 months 2)  Your physician recommends that you continue on your current medications as directed. Please refer to the Current Medication list given to you today.

## 2010-05-18 NOTE — H&P (Signed)
NAMEMarland [name]  POWELL, HALBERT NO.:  [id number]  MEDICAL RECORD NO.:  [id number]          PATIENT TYPE:  INP  LOCATION:  2034                         FACILITY:  [facility]  PHYSICIAN:  Doylene Canning. Ladona Ridgel, MD    DATE OF BIRTH:  1932-03-30  DATE OF ADMISSION:  04/26/2010 DATE OF DISCHARGE:                             HISTORY & PHYSICAL   ADMITTING DIAGNOSIS:  Persistent atrial fibrillation with the patient being admitted for Tikosyn therapy.  HISTORY OF PRESENT ILLNESS:  The patient is a very pleasant 77 year old male with long-standing dilated cardiomyopathy, atrial flutter, and congestive heart failure.  He underwent electrophysiologic study several months ago and was found to be in left atrial flutter, and he was cardioverted back to sinus rhythm.  The patient maintained sinus rhythm for several months but then developed recurrent atrial flutter.  The patient's heart failure in sinus rhythm is class I and in atrial fibrillation is class II.  He notes occasional bouts of peripheral edema, though these are very self-limited.  He has very minimal palpitations.  He has never had syncope, and his defibrillator has not recently gone off.  He denies chest pain.  PAST MEDICAL HISTORY:  As noted in the HPI.  He also has a history of COPD.  He has a history of gastroesophageal reflux disease, history of porphyria, history of diverticulitis, history of BPH.  FAMILY HISTORY:  Negative for premature coronary disease.  SOCIAL HISTORY:  The patient is married.  He does not have alcohol abuse.  He denies alcohol or ethanol or tobacco abuse.  REVIEW OF SYSTEMS:  All systems were reviewed and negative except as noted in the HPI.  PHYSICAL EXAMINATION:  GENERAL:  He is a pleasant well-appearing elderly man, in no distress. VITAL SIGNS:  The blood pressure today was 113/73, pulse was 75 and irregular, respirations were 18, temperature is 98. HEENT:  Normocephalic, atraumatic.  Pupils  equal, round.  Oropharynx is moist.  Sclerae anicteric. NECK:  No jugular venous distention.  There is no thyromegaly. LUNGS:  Clear bilaterally to auscultation.  No wheezes, rales, or rhonchi were appreciated. CARDIOVASCULAR:  Irregular rhythm with normal S1 and S2.  The PMI was enlarged and laterally displaced. ABDOMEN:  Soft, nontender.  There is no organomegaly.  The bowel sounds are present.  No rebound or guarding. EXTREMITIES:  Demonstrated no cyanosis, clubbing, or edema.  The pulses are 2+ and symmetric. NEUROLOGIC:  Alert and oriented x3.  He was nonfocal.  His EKG demonstrates atrial fibrillation/flutter with a controlled ventricular response.  IMPRESSION: 1. Nonischemic cardiomyopathy. 2. Atrial fibrillation/flutter (persistent). 3. Congestive heart failure, class I-II, ejection fraction 25-30%. 4. Status post implantable cardioverter-defibrillator insertion.  DISCUSSION:  The patient is admitted today for initiation of Tikosyn therapy in hopes of converting him back to sinus rhythm.  His creatinine clearance is right at 60.  I have recommended that he start with a higher dose Tikosyn therapy as he has a defibrillator for backup in case he develops a proarrhythmia.  We plan to follow the patient in the hospital for several days with expectation that he could be discharged home within 72 hours of his initial  admission.     Doylene Canning. Ladona Ridgel, MD     GWT/MEDQ  D:  04/26/2010  T:  04/27/2010  Job:  [id number]  Electronically Signed by Lewayne Bunting MD on 05/18/2010 05:16:12 PM

## 2010-05-25 NOTE — Medication Information (Signed)
Summary: Coumadin Clinic  Anticoagulant Therapy  Managed by: Cloyde Reams, RN, BSN Referring MD: Tonette Lederer, Fayrene Fearing PCP: Eustaquio Boyden  MD Supervising MD: Mariah Milling Indication 1: Atrial Fibrillation Lab Used:  [facility] [place] Site: [place] INR POC 2.4 INR RANGE 2.0-3.0  Dietary changes: no    Health status changes: no    Bleeding/hemorrhagic complications: no    Recent/future hospitalizations: no    Any changes in medication regimen? no    Recent/future dental: no  Any missed doses?: no       Is patient compliant with meds? yes       Allergies: 1)  ! Penicillin 2)  ! Terramycin 3)  ! Prilosec 4)  ! Biaxin 5)  ! Pepcid 6)  ! * Zegerid 7)  ! * Ranitidine  Anticoagulation Management History:      The patient is taking warfarin and comes in today for a routine follow up visit.  Positive risk factors for bleeding include an age of 17 years or older.  The bleeding index is 'intermediate risk'.  Positive CHADS2 values include History of CHF and Age > 40 years old.  His last INR was 3.5 ratio.  Anticoagulation responsible provider: gollan.  INR POC: 2.4.  Cuvette Lot#: [id number].  Exp: 05/2011.    Anticoagulation Management Assessment/Plan:      The patient's current anticoagulation dose is Warfarin sodium 5 mg tabs: Use as directed by Anticoagulation Clinic, Warfarin sodium 5 mg tabs: Use as directed by Anticoagulation Clinic.  The target INR is 2.0-3.0.  The next INR is due 06/14/2010.  Anticoagulation instructions were given to patient.  Results were reviewed/authorized by Cloyde Reams, RN, BSN.  He was notified by Cloyde Reams RN.         Prior Anticoagulation Instructions: INR 3.0  Start taking 1.5 tablets daily except 1 tablet on Tuesdays, Thursdays, and Saturdays.  Recheck in 2 weeks.    Current Anticoagulation Instructions: INR 2.4  Continue on same dosage 1.5 tablets daily except 1 tablet on Tuesdays, Thursdays, and Saturdays.  Recheck in  4 weeks.

## 2010-06-14 ENCOUNTER — Encounter (INDEPENDENT_AMBULATORY_CARE_PROVIDER_SITE_OTHER): Payer: Medicare Other

## 2010-06-14 ENCOUNTER — Encounter: Payer: Self-pay | Admitting: Internal Medicine

## 2010-06-14 DIAGNOSIS — Z7901 Long term (current) use of anticoagulants: Secondary | ICD-10-CM

## 2010-06-14 DIAGNOSIS — I4891 Unspecified atrial fibrillation: Secondary | ICD-10-CM

## 2010-06-20 NOTE — Medication Information (Signed)
Summary: rov/ewj  Anticoagulant Therapy  Managed by: Bethena Midget, RN, BSN Referring MD: Tonette Lederer, Fayrene Fearing PCP: Eustaquio Boyden  MD Supervising MD: Gala Romney MD, [name] Indication 1: Atrial Fibrillation Lab Used:  [facility] [place] Site: [place] INR RANGE 2.0-3.0  Dietary changes: no    Health status changes: no    Bleeding/hemorrhagic complications: no    Recent/future hospitalizations: no    Any changes in medication regimen? no    Recent/future dental: no  Any missed doses?: no       Is patient compliant with meds? yes       Allergies: 1)  ! Penicillin 2)  ! Terramycin 3)  ! Prilosec 4)  ! Biaxin 5)  ! Pepcid 6)  ! * Zegerid 7)  ! * Ranitidine  Anticoagulation Management History:      The patient is taking warfarin and comes in today for a routine follow up visit.  Positive risk factors for bleeding include an age of 29 years or older.  The bleeding index is 'intermediate risk'.  Positive CHADS2 values include History of CHF and Age > 36 years old.  His last INR was 3.5 ratio.  Anticoagulation responsible provider: Mionna Advincula MD, [name].  Cuvette Lot#: [id number].  Exp: 04/2011.    Anticoagulation Management Assessment/Plan:      The patient's current anticoagulation dose is Warfarin sodium 5 mg tabs: Use as directed by Anticoagulation Clinic, Warfarin sodium 5 mg tabs: Use as directed by Anticoagulation Clinic.  The target INR is 2.0-3.0.  The next INR is due 07/12/2010.  Anticoagulation instructions were given to patient.  Results were reviewed/authorized by Bethena Midget, RN, BSN.  He was notified by Bethena Midget, RN, BSN.         Prior Anticoagulation Instructions: INR 2.4  Continue on same dosage 1.5 tablets daily except 1 tablet on Tuesdays, Thursdays, and Saturdays.  Recheck in 4 weeks.    Current Anticoagulation Instructions: INR 2.4 Continue 7.5mg s daily except 5mg s on Tuesdays, Thursdays and Saturdays. Recheck in 4 weeks.

## 2010-06-22 ENCOUNTER — Encounter (INDEPENDENT_AMBULATORY_CARE_PROVIDER_SITE_OTHER): Payer: Medicare Other

## 2010-06-22 DIAGNOSIS — I428 Other cardiomyopathies: Secondary | ICD-10-CM

## 2010-06-22 DIAGNOSIS — I509 Heart failure, unspecified: Secondary | ICD-10-CM

## 2010-06-26 ENCOUNTER — Telehealth: Payer: Self-pay | Admitting: Internal Medicine

## 2010-06-27 ENCOUNTER — Encounter: Payer: Self-pay | Admitting: Cardiology

## 2010-06-27 DIAGNOSIS — I4892 Unspecified atrial flutter: Secondary | ICD-10-CM

## 2010-06-27 DIAGNOSIS — Z7901 Long term (current) use of anticoagulants: Secondary | ICD-10-CM | POA: Insufficient documentation

## 2010-06-28 ENCOUNTER — Other Ambulatory Visit: Payer: Self-pay | Admitting: *Deleted

## 2010-06-28 DIAGNOSIS — I509 Heart failure, unspecified: Secondary | ICD-10-CM

## 2010-06-28 MED ORDER — ENALAPRIL MALEATE 5 MG PO TABS: 5.0000 mg | ORAL_TABLET | Freq: Every day | ORAL | Status: DC

## 2010-06-30 ENCOUNTER — Telehealth: Payer: Self-pay | Admitting: Cardiology

## 2010-06-30 NOTE — Telephone Encounter (Signed)
[phone]. Pt had question regarding meds-  Enalapril .

## 2010-06-30 NOTE — Telephone Encounter (Signed)
Pt calling stating he has always taken enalapril 5MG  BID--now he is getting ,from mail order, enalapril 5mg  Q.D. --what dose is he supposed to be on and someone --thanks nt needs to call [employer] [phone]--pt I.D. Is H [id number]--WGNFAO call or send in correct dosage to mail order pharmacy

## 2010-07-01 ENCOUNTER — Encounter: Payer: Self-pay | Admitting: Physician Assistant

## 2010-07-02 NOTE — Telephone Encounter (Signed)
I have reviewed recent notes including the hospital and Dr. Lubertha Basque notes.  If the patient is tolerating the BID dosing of enalapril we should continue this.  What are his blood pressures?  He needs to have a BMET.  Change prescription to bid.  He was on this for along time and I cannot see why it would have been changed.

## 2010-07-05 ENCOUNTER — Other Ambulatory Visit: Payer: Self-pay | Admitting: *Deleted

## 2010-07-05 DIAGNOSIS — I509 Heart failure, unspecified: Secondary | ICD-10-CM

## 2010-07-05 MED ORDER — ENALAPRIL MALEATE 5 MG PO TABS: 5.0000 mg | ORAL_TABLET | Freq: Two times a day (BID) | ORAL | Status: DC

## 2010-07-05 NOTE — Telephone Encounter (Signed)
RX has been printed and awaiting Dr Antoine Poche to sign - it will be faxed into pharmacy on Friday.

## 2010-07-06 NOTE — Progress Notes (Signed)
Summary: question re device beeping  Phone Note Call from Patient Call back at Home Phone [phone]   Caller: Patient Reason for Call: Talk to Nurse Summary of Call: pt states his deivce box has been going off. pt wants to know does he need to come in sooner.  Initial call taken by: Roe Coombs,  June 26, 2010 9:13 AM  Follow-up for Phone Call        pt states device started beeping last week but didnt know what was beeping at that time.  pt is scheduled for ov on 07-11-10 w/scott. Vella Kohler  June 26, 2010 2:11 PM

## 2010-07-09 ENCOUNTER — Encounter: Payer: Self-pay | Admitting: *Deleted

## 2010-07-11 ENCOUNTER — Encounter: Payer: Self-pay | Admitting: Physician Assistant

## 2010-07-11 ENCOUNTER — Ambulatory Visit (INDEPENDENT_AMBULATORY_CARE_PROVIDER_SITE_OTHER): Payer: Medicare Other | Admitting: Physician Assistant

## 2010-07-11 ENCOUNTER — Encounter: Payer: Self-pay | Admitting: *Deleted

## 2010-07-11 ENCOUNTER — Ambulatory Visit: Payer: Medicare Other | Admitting: Physician Assistant

## 2010-07-11 VITALS — BP 117/72 | HR 62 | Ht 67.0 in | Wt 148.4 lb

## 2010-07-11 DIAGNOSIS — I429 Cardiomyopathy, unspecified: Secondary | ICD-10-CM

## 2010-07-11 DIAGNOSIS — Z95810 Presence of automatic (implantable) cardiac defibrillator: Secondary | ICD-10-CM

## 2010-07-11 DIAGNOSIS — I5022 Chronic systolic (congestive) heart failure: Secondary | ICD-10-CM

## 2010-07-11 DIAGNOSIS — I4892 Unspecified atrial flutter: Secondary | ICD-10-CM

## 2010-07-11 NOTE — Assessment & Plan Note (Signed)
He is on a good medical regimen.  Continue current therapy.

## 2010-07-11 NOTE — Patient Instructions (Signed)
Your physician has recommended that you have a defibrillator battery changed. See instruction sheet

## 2010-07-11 NOTE — Progress Notes (Signed)
History of Present Illness: Primary Electrophysiologist:  Dr. [name] is a 77 y.o. male with a h/o Nonischemic cardiomyopathy with an EF of 35%, chronic systolic congestive heart failure, atrial flutter controlled on Tikosyn therapy, chronic Coumadin therapy, status post Bi-V-ICD implantation.  His device recently reached ERI.  He presents for preoperative assessment.  He denies any chest pain, shortness of breath or syncope.  He describes NYHA class II symptoms.  He denies orthopnea, PND or significant pedal edema.  He denies any therapy from his defibrillator.  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  He essentially reports no limitations.  Past Medical History  Diagnosis Date  . NICM (nonischemic cardiomyopathy)     Cardiac catheterization March 2006 without coronary disease; echocardiogram August 2008: EF 35%, mild AI, left atrial enlargement  . Atrial flutter     A.  Status post cardioversion; B.  Tikosyn therapy  . COPD (chronic obstructive pulmonary disease)   . LBBB (left bundle branch block)   . GERD (gastroesophageal reflux disease)     h/o esophageal stricture with dilation  . Porphyria   . IBS (irritable bowel syndrome)   . BPH (benign prostatic hyperplasia)   . Diverticulitis   . Allergic rhinitis   . Hyperlipidemia   . Chronic systolic congestive heart failure   . History of GI bleed     Secondary to hemorrhoids   Past Surgical History  Procedure Date  . Penile prosthesis implant 2007  . Cholecystectomy   . Goiter removal   . Nose surgery   . Hernia repair   . Cardiac catheterization   . Biv-aicd implant     [employer]    Current Outpatient Prescriptions  Medication Sig Dispense Refill  . azelastine (ASTELIN) 137 MCG/SPRAY nasal spray 1 spray by Nasal route as needed. Use in each nostril as directed       . carvedilol (COREG) 6.25 MG tablet Take 6.25 mg by mouth 2 (two) times daily with a meal.        . diazepam (VALIUM) 5 MG  tablet 1/4 tab at bedtime as needed       . docusate sodium (COLACE) 100 MG capsule Take 100 mg by mouth as needed.        . dofetilide (TIKOSYN) 500 MCG capsule Take 500 mcg by mouth 2 (two) times daily.        . enalapril (VASOTEC) 5 MG tablet Take 1 tablet (5 mg total) by mouth 2 (two) times daily.  180 tablet  3  . flunisolide (NASAREL) 29 MCG/ACT (0.025%) nasal spray 2 sprays by Nasal route daily. Dose is for each nostril.       . Fluticasone-Salmeterol (ADVAIR DISKUS) 100-50 MCG/DOSE AEPB Inhale 1 puff into the lungs every 12 (twelve) hours.        . furosemide (LASIX) 10 MG/ML solution Take by mouth daily.        Marland Kitchen guaifenesin (HUMIBID E) 400 MG TABS Take 400 mg by mouth as needed.        . Loratadine 10 MG CAPS Take 1 capsule by mouth daily.        . montelukast (SINGULAIR) 10 MG tablet Take 10 mg by mouth daily as needed.        . sucralfate (CARAFATE) 1 G tablet Take 0.5 g by mouth as needed.        . warfarin (COUMADIN) 5 MG tablet Take by mouth as directed.  Allergies  Allergen Reactions  . Clarithromycin     REACTION: NAUSEA  . Famotidine     REACTION: ABD. PAIN  . Omeprazole     REACTION: UNSPECIFIED  . Omeprazole-Sodium Bicarbonate     REACTION: diarrhea  . Oxytetracycline     REACTION: BUMPS  . Penicillins     REACTION: SWELLING  . Ranitidine     REACTION: diarrhea   History   Social History  . Marital Status: Married    Spouse Name: N/A    Number of Children: 3  . Years of Education: N/A   Occupational History  . retired    Social History Main Topics  . Smoking status: Never Smoker   . Smokeless tobacco: Never Used  . Alcohol Use: No  . Drug Use: No  . Sexually Active: None   Other Topics Concern  . None   Social History Narrative   Still works on a farm.   Family History  Problem Relation Age of Onset  . Coronary artery disease Father   . Hypertension Father   . Dysphagia Sister   . Breast cancer    . Ovarian cancer    . Uterine  cancer      ROS:  He denies any fevers, chills, cough.  He does have occasional dysphagia.  This is related to his acid reflux.  This is stable.  He denies melena, hematochezia, hematuria, dysuria.  He denies arthralgias or myalgias.  He denies any weight changes or skin changes.  Please see history of present illness.  All other systems reviewed and negative.  Vital Signs: BP 117/72  Pulse 62  Ht 5\' 7"  (1.702 m)  Wt 148 lb 6.4 oz (67.314 kg)  BMI 23.24 kg/m2  PHYSICAL EXAM: Well nourished, well developed, in no acute distress HEENT: normal Neck: no JVD Endocrine: No thyromegaly Vascular: No carotid bruits bilaterally; DP/PT 2+ bilaterally Lymphatic: No cervical adenopathy Cardiac:  normal S1, S2; RRR; no murmur Lungs:  clear to auscultation bilaterally, no wheezing, rhonchi or rales Abd: soft, nontender, no hepatomegaly Ext: no edema Musculoskeletal: No deformities aside from some mild Heberdeens nodes on his hands Skin: warm and dry Neuro:  CNs 2-12 intact, no focal abnormalities noted Psych: Normal affect  ASSESSMENT AND PLAN:

## 2010-07-11 NOTE — Assessment & Plan Note (Signed)
His device is at [facility].  We will schedule his procedure in the next one to 2 weeks Dr. Ladona Ridgel.  I briefly discussed risks of the procedure including infection or bleeding.  He understands and is willing to proceed.

## 2010-07-11 NOTE — Assessment & Plan Note (Signed)
Volume is stable.  He reports class II symptoms.  Continue current therapy.

## 2010-07-12 ENCOUNTER — Ambulatory Visit (INDEPENDENT_AMBULATORY_CARE_PROVIDER_SITE_OTHER): Payer: Medicare Other | Admitting: Emergency Medicine

## 2010-07-12 DIAGNOSIS — I4892 Unspecified atrial flutter: Secondary | ICD-10-CM

## 2010-07-12 DIAGNOSIS — Z7901 Long term (current) use of anticoagulants: Secondary | ICD-10-CM

## 2010-07-12 LAB — POCT INR: INR: 2.2

## 2010-07-12 NOTE — Patient Instructions (Signed)
Continue on same dosage 1.5 tablets daily except 1 tablet on Tuesdays, Thursdays, and Saturdays.  Recheck in 4 weeks.

## 2010-07-17 ENCOUNTER — Other Ambulatory Visit (INDEPENDENT_AMBULATORY_CARE_PROVIDER_SITE_OTHER): Payer: Medicare Other | Admitting: *Deleted

## 2010-07-17 DIAGNOSIS — I429 Cardiomyopathy, unspecified: Secondary | ICD-10-CM

## 2010-07-17 DIAGNOSIS — I5022 Chronic systolic (congestive) heart failure: Secondary | ICD-10-CM

## 2010-07-17 DIAGNOSIS — I4892 Unspecified atrial flutter: Secondary | ICD-10-CM

## 2010-07-17 LAB — CBC WITH DIFFERENTIAL/PLATELET
Basophils Absolute: 0.1 10*3/uL (ref 0.0–0.1)
Basophils Relative: 1.1 % (ref 0.0–3.0)
Eosinophils Absolute: 0.2 10*3/uL (ref 0.0–0.7)
Eosinophils Relative: 4.2 % (ref 0.0–5.0)
HCT: 36.9 % — ABNORMAL LOW (ref 39.0–52.0)
Hemoglobin: 12.8 g/dL — ABNORMAL LOW (ref 13.0–17.0)
Lymphocytes Relative: 27.9 % (ref 12.0–46.0)
Lymphs Abs: 1.3 10*3/uL (ref 0.7–4.0)
MCHC: 34.8 g/dL (ref 30.0–36.0)
MCV: 85.4 fl (ref 78.0–100.0)
Monocytes Absolute: 0.4 10*3/uL (ref 0.1–1.0)
Monocytes Relative: 8.4 % (ref 3.0–12.0)
Neutro Abs: 2.8 10*3/uL (ref 1.4–7.7)
Neutrophils Relative %: 58.4 % (ref 43.0–77.0)
Platelets: 103 10*3/uL — ABNORMAL LOW (ref 150.0–400.0)
RBC: 4.33 Mil/uL (ref 4.22–5.81)
RDW: 15.6 % — ABNORMAL HIGH (ref 11.5–14.6)
WBC: 4.8 10*3/uL (ref 4.5–10.5)

## 2010-07-17 LAB — BASIC METABOLIC PANEL
BUN: 18 mg/dL (ref 6–23)
CO2: 30 mEq/L (ref 19–32)
Calcium: 8.8 mg/dL (ref 8.4–10.5)
Chloride: 102 mEq/L (ref 96–112)
Creatinine, Ser: 1 mg/dL (ref 0.4–1.5)
GFR: 80.76 mL/min (ref >60.00)
Glucose, Bld: 76 mg/dL (ref 70–99)
Potassium: 4.7 mEq/L (ref 3.5–5.1)
Sodium: 137 mEq/L (ref 135–145)

## 2010-07-17 LAB — PROTIME-INR
INR: 2.5 ratio — ABNORMAL HIGH (ref 0.8–1.0)
Prothrombin Time: 25.2 s — ABNORMAL HIGH (ref 10.2–12.4)

## 2010-07-24 ENCOUNTER — Ambulatory Visit (HOSPITAL_COMMUNITY)
Admission: RE | Admit: 2010-07-24 | Discharge: 2010-07-24 | Disposition: A | Discharge status: Home / Self Care | Payer: Medicare Other | Source: Ambulatory Visit | Attending: Internal Medicine | Admitting: Internal Medicine

## 2010-07-24 ENCOUNTER — Ambulatory Visit (HOSPITAL_COMMUNITY): Payer: Medicare Other

## 2010-07-24 DIAGNOSIS — E785 Hyperlipidemia, unspecified: Secondary | ICD-10-CM | POA: Insufficient documentation

## 2010-07-24 DIAGNOSIS — I428 Other cardiomyopathies: Secondary | ICD-10-CM

## 2010-07-24 DIAGNOSIS — Z7901 Long term (current) use of anticoagulants: Secondary | ICD-10-CM | POA: Insufficient documentation

## 2010-07-24 DIAGNOSIS — I10 Essential (primary) hypertension: Secondary | ICD-10-CM | POA: Insufficient documentation

## 2010-07-24 DIAGNOSIS — I509 Heart failure, unspecified: Secondary | ICD-10-CM | POA: Insufficient documentation

## 2010-07-24 DIAGNOSIS — Z4502 Encounter for adjustment and management of automatic implantable cardiac defibrillator: Secondary | ICD-10-CM | POA: Insufficient documentation

## 2010-07-24 DIAGNOSIS — J449 Chronic obstructive pulmonary disease, unspecified: Secondary | ICD-10-CM | POA: Insufficient documentation

## 2010-07-24 DIAGNOSIS — J4489 Other specified chronic obstructive pulmonary disease: Secondary | ICD-10-CM | POA: Insufficient documentation

## 2010-07-24 DIAGNOSIS — Z79899 Other long term (current) drug therapy: Secondary | ICD-10-CM | POA: Insufficient documentation

## 2010-07-24 LAB — SURGICAL PCR SCREEN
MRSA, PCR: NEGATIVE
Staphylococcus aureus: NEGATIVE

## 2010-07-24 LAB — PROTIME-INR
INR: 1.21 (ref 0.00–1.49)
Prothrombin Time: 15.5 seconds — ABNORMAL HIGH (ref 11.6–15.2)

## 2010-07-26 ENCOUNTER — Encounter: Payer: Self-pay | Admitting: Family Medicine

## 2010-08-02 ENCOUNTER — Ambulatory Visit (INDEPENDENT_AMBULATORY_CARE_PROVIDER_SITE_OTHER): Payer: Medicare Other | Admitting: *Deleted

## 2010-08-02 DIAGNOSIS — I428 Other cardiomyopathies: Secondary | ICD-10-CM

## 2010-08-02 NOTE — Progress Notes (Signed)
WOUND CHECK/ICD

## 2010-08-07 NOTE — Op Note (Signed)
  NAME:  [name], [name] NO.:  [id number]  MEDICAL RECORD NO.:  [id number]           PATIENT TYPE:  O  LOCATION:  [facility]                         FACILITY:  [facility]  PHYSICIAN:  Doylene Canning. Ladona Ridgel, MD    DATE OF BIRTH:  1931/09/10  DATE OF PROCEDURE:  07/24/2010 DATE OF DISCHARGE:  07/24/2010                              OPERATIVE REPORT   PROCEDURE PERFORMED:  Removal of a previously implanted biventricular ICD, which had reached elective replacement indication and insertion of a new elective replacement ICD with defibrillation threshold testing.  INTRODUCTION:  The patient is very pleasant 77 year old male with chronic left bundle-branch block, nonischemic cardiomyopathy, EF 20%. He has reached elective replacement indication on his previously implanted BiV ICD, and is referred now for generator change.  PROCEDURE:  After informed consent was obtained, the patient was taken to Diagnostic EP Lab in the fasting state.  After usual preparation and draping, intravenous fentanyl and midazolam was given for sedation.  A 30 mL of lidocaine was infiltrated into the left infraclavicular region. A 6-cm incision was carried out over this region.  Electrocautery was utilized to dissect down to the pacemaker/ICD pocket.  The device was removed without difficulty using gentle traction.  Electrocautery was utilized to free up the fibrous adhesions.  They were minimal.  The pocket was irrigated with antibiotic irrigation.  The leads were evaluated and found to be working satisfactorily.  The new Medtronic BiV ICD serial number [id number] was connected to the atrial RV and LV leads and placed back in the subcutaneous pocket.  It should be noted that the patient did have a 6949 RV lead in, and the rate/sense portion of this lead was hooked up to the LV port, and the LV lead was placed in the rate/sense portion of the RV port.  This was done to minimize lead problems secondary to the  6949 defibrillator lead.  Having accomplished this, the device was placed back in the subcutaneous pocket.  The pocket was irrigated with antibiotic irrigation.  Electrocautery was utilized to assure hemostasis, and the patient was more deeply sedated for defibrillation threshold testing.  After the patient more deeply sedated with fentanyl and Versed, VF was induced with a T-wave shock.  A 15 joules shock was subsequently delivered, which terminated, ventricular fibrillation restored sinus rhythm.  No additional defibrillation threshold testing was carried out, and the incision was closed with 2-0 and 3-0 Vicryl.  Benzoin and Steri- Strips painted on the skin.  A pressure dressing was applied, and the patient was returned to his room in satisfactory condition.  COMPLICATIONS:  There were no immediate procedure complications.  RESULTS:  This demonstrated successful removal of previously implanted Medtronic BiV ICD followed by device testing followed by insertion of a new biopsy ICD with defibrillation threshold testing.     Doylene Canning. Ladona Ridgel, MD     GWT/MEDQ  D:  07/24/2010  T:  07/25/2010  Job:  [id number]  cc:   Rollene Rotunda, MD, [facility]  Electronically Signed by Lewayne Bunting MD on 08/07/2010 07:58:48 AM

## 2010-08-09 ENCOUNTER — Ambulatory Visit (INDEPENDENT_AMBULATORY_CARE_PROVIDER_SITE_OTHER): Payer: Medicare Other | Admitting: Emergency Medicine

## 2010-08-09 DIAGNOSIS — Z7901 Long term (current) use of anticoagulants: Secondary | ICD-10-CM

## 2010-08-09 DIAGNOSIS — I4892 Unspecified atrial flutter: Secondary | ICD-10-CM

## 2010-08-09 LAB — POCT INR: INR: 2.1

## 2010-08-21 ENCOUNTER — Ambulatory Visit (INDEPENDENT_AMBULATORY_CARE_PROVIDER_SITE_OTHER): Payer: Medicare Other | Admitting: Family Medicine

## 2010-08-21 ENCOUNTER — Encounter: Payer: Self-pay | Admitting: Family Medicine

## 2010-08-21 DIAGNOSIS — I429 Cardiomyopathy, unspecified: Secondary | ICD-10-CM

## 2010-08-21 DIAGNOSIS — K5900 Constipation, unspecified: Secondary | ICD-10-CM | POA: Insufficient documentation

## 2010-08-21 MED ORDER — SUCRALFATE 1 G PO TABS: 0.5000 g | ORAL_TABLET | ORAL | Status: DC | PRN

## 2010-08-21 MED ORDER — BETAMETHASONE VALERATE 0.1 % EX CREA: TOPICAL_CREAM | Freq: Two times a day (BID) | CUTANEOUS | Status: DC

## 2010-08-21 NOTE — Patient Instructions (Signed)
I'm glad you're doing well. Return in 6 months for physical and next visit, sooner if needed. For constipation - make sure you stay well hydrated and get good servings of fruits and vegetables.  Fiber handout provided. Good to see you today, call us with questions.

## 2010-08-21 NOTE — Assessment & Plan Note (Signed)
Continue docusate regularly.  Discussed increasing fiber in diet, handout provided.

## 2010-08-21 NOTE — Assessment & Plan Note (Signed)
Stable on current meds continue 

## 2010-08-21 NOTE — Progress Notes (Signed)
  Subjective:    Patient ID: [name], male    DOB: 07/24/1931, 77 y.o.   MRN: [id number]  HPI CC: 45m f/u  77 y.o. with h/o Nonischemic cardiomyopathy with an EF of 35%, chronic systolic congestive heart failure, atrial flutter controlled on Tikosyn therapy, chronic Coumadin therapy, status post Bi-V-ICD implantation, COPD.  Presents for 3 mo f/u actually 6 mo f/u.  constipation - taking docusate pretty often, if doesn't take gets constipated.  Working on farm feels doing well overall.  No chest pain, tightness, shortness of breath, heart fluttering, dizziness, HA, vision changes.  No abd pain, nausea/vomiting, blood in stool or urine.  New pacer/defibrillator placed in April 2012 by Dr. Ladona Ridgel.  Medications and allergies reviewed and updated as above. PMHx reviewed for relevance.  Review of Systems Per HPI    Objective:   Physical Exam  Vitals reviewed. Constitutional: He appears well-developed and well-nourished. No distress.  HENT:  Head: Normocephalic and atraumatic.  Mouth/Throat: Oropharynx is clear and moist. No oropharyngeal exudate.  Eyes: Conjunctivae and EOM are normal. Pupils are equal, round, and reactive to light. No scleral icterus.  Neck: Normal range of motion. Neck supple. Carotid bruit is not present.  Cardiovascular: Normal rate, regular rhythm, normal heart sounds and intact distal pulses.   No murmur heard. Pulmonary/Chest: Breath sounds normal. No respiratory distress. He has no wheezes. He has no rales.  Lymphadenopathy:    He has no cervical adenopathy.  Skin: Skin is warm and dry. No rash noted.  Psychiatric: He has a normal mood and affect.          Assessment & Plan:

## 2010-08-22 NOTE — Assessment & Plan Note (Signed)
[name] OFFICE NOTE   NAME:[name]                MRN:          [id number]  DATE:10/30/2006                            DOB:          04-14-1931    PRIMARY CARDIOLOGIST:  [name] Rotunda, MD, [name].   PRIMARY CARE PHYSICIAN:  [name], M.D.   PATIENT PROFILE:  A 77 year old Caucasian male with a prior history of  nonischemic cardiomyopathy who presents for clinic followup.   PROBLEM LIST:  1. Nonischemic cardiomyopathy.      a.     A 2D echocardiogram in April, 2008.  EF 35%.  EF previously       documented at 25%.      b.     Status post Medtronic InSync biventricular ICD.  2. Hypertension.  3. Sinus bradycardia.  4. Left bundle branch block.  5. Benign prostatic hypertrophy.   HISTORY OF PRESENT ILLNESS:  A 77 year old Caucasian male with a prior  history of nonischemic cardiomyopathy who was last seen in the clinic on  October 02, 2006.  At that time, his Coreg was increased from 6.25 mg  b.i.d. to 9.37 mg b.i.d.  Over the first week or so, he was taking 6.25  in the a.m. and 9.375 in the p.m., and then he switched this to 9.375  b.i.d. about a week ago.  About four days ago or so, he was initiated on  Flomax therapy by his primary care doctor for urinary symptoms of  nocturia.  Although he has noted some improvement in his nocturia with  his Flomax over the past two days, he has also had significant weakness  and a low energy state with blood pressures in the 80s.  Review of  Flomax shows that it can cause orthostatic hypotension.  [name]  prefers to just stop the Flomax, as he has not yet purchased a  prescription and is only using samples and see if his pressure improves.  He otherwise has been doing well and had remained active in his gardens  without any chest pain, shortness of breath, or limitations in  activities.  He denies any PND, orthopnea, dizziness, syncope, or  edema.   HOME MEDICATIONS:  1. Lasix 20 mg 1/2 tablet p.r.n.  2. Enalpril 5 mg b.i.d.  3. Astelin nasal spray 30 mg each nostril daily.  4. Nasarel 25 mcg daily.  5. Mucinex 400 mg daily.  6. Advair 100/50 mg b.i.d.  7. Singulair 10 mg daily.  8. Clarinex 5 mg daily.  9. Zegerid 40 mg every other day.  10.Coreg 9.375 mg b.i.d.  11.Flomax 0.4 mg daily.   PHYSICAL EXAMINATION:  Blood pressure lying is 116/66 with a heart rate  of 64.  Sitting 110/66.  Heart rate 64.  Standing 118/66, heart rate 60.  Standing at 5 minutes 102/62, heart rate 60.  He was asymptomatic  throughout orthostatic vital signs.  A pleasant white male in no acute distress.  Awake, alert and oriented  x3.  HEENT:  Normal.  NECK:  Without bruit or JVD.  LUNGS:  Regular and unlabored.  Clear  to auscultation.  CARDIAC:  Regular S1 and S2.  No S3, S4, or murmurs.  ABDOMEN: Round, soft, nontender, nondistended.  Bowel sounds present x4.  EXTREMITIES:  Warm and dry.  Pink.  No clubbing, cyanosis or edema.  Dorsalis pedis and posterior tibial pulses are 2+ and equal bilaterally.   ACCESSORY CLINICAL FINDINGS:  None.   ASSESSMENT/PLAN:  1. Nonischemic cardiomyopathy.  Patient has been doing well from this      standpoint.  Unfortunately, he has run into some trouble with      hypotension and weakness, potentially related to either titration      of his Coreg or addition of Flomax.  We have advised him to hold      off on his Flomax for the next couple of days and see if he is      improving.  Further, he is advised to hold his Coreg dose if his      systolic pressure is less than 90.  If he has no improvement      overall with discontinuation of Flomax, then we would have to cut      back on his Coreg to 6.25 in the a.m. and 9.375 in the p.m.      Otherwise he remains on ACE inhibitor therapy.  He is not using his      diuretic, as he is euvolemic.  2. Hypertension:  Well controlled and if anything, a bit on the  low      side.  Changes as above.  3. Seasonal allergies:  Patient takes multiple medications for this,      as prescribed by primary care.   DISPOSITION:  Patient will follow up with [name] in approximately  three weeks.      [name], ANP  Electronically Signed      [name] Rotunda, MD, [name]  Electronically Signed   CB/MedQ  DD: 10/30/2006  DT: 10/31/2006  Job #: [id number]

## 2010-08-22 NOTE — Assessment & Plan Note (Signed)
[facility] OFFICE NOTE   NAME:[name] [name] [name]                MRN:          [id number]  DATE:01/17/2007                            DOB:          01-13-1932    PRIMARY:  Dr. Laurita Quint.   REASON FOR PRESENTATION:  Evaluate the patient with cardiomyopathy.   HISTORY OF PRESENT ILLNESS:  The patient returns for followup of the  above.  He is 77 years old.  He has been doing very well.  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  He has worked vigorously in his gardens all  summer.  He says he is now back to baseline of energy.  He is tolerating  current regimen.  He is not having any shortness of breath, PND, or  orthopnea.  He has not had any palpitations, pre-syncope, or syncope.  He denies any chest pain.   PAST MEDICAL HISTORY:  Cardiomyopathy (nonischemic, normal coronary  arteries on catheterization, ejection fraction 25% improved to 35%).  Status post Medtronic ICD CRT.  Hypertension.  Sinus bradycardia.  Left  bundle branch block.   ALLERGIES:  PENICILLIN.   MEDICATIONS:  1. Lasix p.r.n.  2. Enalapril 5 mg b.i.d.  3. Astelin nasal spray.  4. Mucinex.  5. Singulair 10 mg daily.  6. Zegerid.  7. Equate.  8. Flunisolide.  9. Coreg 9.275 mg b.i.d.   REVIEW OF SYSTEMS:  As stated in the HPI and otherwise negative for  other systems.   PHYSICAL EXAMINATION:  The patient is in no distress.  Blood pressure 112/66, heart rate 60 and regular, body mass index 22,  weight 148 pounds.  NECK:  No jugular venous distension at 45 degrees.  Carotid upstroke  brisk and symmetric, no bruits, thyromegaly.  LYMPHATICS:  No adenopathy.  LUNGS:  Clear to auscultation bilaterally.  BACK:  No costovertebral angle tenderness.  CHEST:  Unremarkable.  HEART:  PMI not displaced or sustained, S1 and S2 within normal limits,  no S3, no S4, no clicks, rubs, murmurs.  ABDOMEN:  Flat, positive bowel sounds, normal in  frequency and pitch, no  bruits, rebound, guarding.  No midline pulsatile masses, hepatomegaly,  splenomegaly.  SKIN:  No rashes, no nodules.  EXTREMITIES:  With 2+ pulses, no edema.   ASSESSMENT AND PLAN:  1. Cardiomyopathy.  The patient is doing well with regard to this.  At      this point, I will not titrate his meds further, as he does get      fatigued.  He is a rather small person.  This is a reasonable dose.  2. Defibrillator.  This is up to date and followed.  3. Followup.  I will see him back in about 4 months or sooner if      needed.     Rollene Rotunda, MD, [facility]  Electronically Signed    JH/MedQ  DD: 01/17/2007  DT: 01/17/2007  Job #: [id number]   cc:   Arta Silence, MD

## 2010-08-22 NOTE — Assessment & Plan Note (Signed)
[facility] OFFICE NOTE   NAME:[name] [name] BART                MRN:          [id number]  DATE:11/18/2006                            DOB:          1931/11/13    PRIMARY:  Arta Silence, M.D.   REASON FOR PRESENTATION:  Evaluate the patient with cardiomyopathy.   HISTORY OF PRESENT ILLNESS:  The patient is a 77 year old gentleman.  Over the last 2 to 3 weeks, he has not been feeling well.  He has had  increased fatigue and decreased exercise tolerance.  His blood pressures  have been running in the 100-110 range systolic at home.  He has been  taking 9.375 mg b.i.d. of Coreg.  He is not having any new shortness of  breath and denies any PND or orthopnea.  He has actually had weight loss  and not weight gain.  He has had a decreased appetite.  He has had no  lower extremity swelling and denies any abdominal distension.  He has  had no chest or arm discomfort.  He has not been noticing any  palpitations, pre-syncope, or syncope.   PAST MEDICAL HISTORY:  Cardiomyopathy (nonischemic with normal coronary  arteries on catheterization EF about 25%.  This improved to 35% on the  echo in April of 2007).  Status post ICD/CRT (Medtronic InSync).  Hypertension.  Sinus bradycardia.  Left bundle branch block.   ALLERGIES:  PENICILLIN.   CURRENT MEDICATIONS:  1. Enalapril 5 mg b.i.d.  2. Astelin 30 mg daily.  3. Mucinex.  4. Singulair.  5. Zegerid 40 mg every other day.  6. Coreg 9.375 mg b.i.d.  7. Flunisolide nasal spray.  8. Equate Allergy Relief.   REVIEW OF SYSTEMS:  As stated in the HPI and otherwise negative for  other systems.   PHYSICAL EXAMINATION:  The patient is in no acute distress.  Blood pressure 106/58, heart rate 60 and regular, weight 143.8 pounds.  HEENT:  Eyelids unremarkable.  Pupils are equal, round, and reactive to  light and accommodation.  Fundi within normal limits.  Oral mucosa  unremarkable.  NECK:  No jugular venous distension at 45 degrees, carotid upstroke  brisk and symmetric, no bruits, thyromegaly.  LYMPHATICS:  No cervical, axillary, or inguinal adenopathy.  LUNGS:  Clear to auscultation bilaterally.  BACK:  No costovertebral angle tenderness.  CHEST:  A well-healed ICD pocket.  HEART:  PMI not displaced or sustained, S1 and S2 within normal limits,  no S3, no S4, no clicks, rubs, murmurs.  ABDOMEN:  Flat, positive bowel sounds, normal in frequency and pitch, no  bruits, rebound, guarding.  No midline pulsatile masses, hepatomegaly,  splenomegaly.  SKIN:  No rashes, no nodules.  EXTREMITIES:  With 2+ pulses throughout, no edema, cyanosis, clubbing.  NEURO:  Oriented to person, place, and time, cranial nerves 2-12 grossly  intact, motor grossly intact.   ASSESSMENT AND PLAN:  1. Fatigue.  The patient's predominant complaint is fatigue.  At this      point, I am going to repeat an echocardiogram as it has been over a  year since we assessed this.  I am going to check a BNP level.  I      will check labs, including a CBC, TSH, and CMET.  I will reduce his      Coreg to 6.25 mg b.i.d. to see if he has any improvement with this.      He may need to have further evaluation of his device to see if we      need to make any timing adjustments.  2. Cardiomyopathy, as above.  3. Followup.  I am going to see him back in a couple of weeks to      review the above and see if he has had any improvement.  The next      step would probably be to discontinue his Coreg all together if he      remains this symptomatic.     Rollene Rotunda, MD, [facility]  Electronically Signed    JH/MedQ  DD: 11/18/2006  DT: 11/19/2006  Job #: [id number]   cc:   Arta Silence, MD

## 2010-08-22 NOTE — Assessment & Plan Note (Signed)
[facility]                            CARDIOLOGY OFFICE NOTE   NAME:[name], [name]                MRN:          [id number]  DATE:11/14/2007                            DOB:          [birth year]    PRIMARY CARE PHYSICIAN:  Arta Silence, MD   REASON FOR PRESENTATION:  Evaluate the patient with cardiomyopathy and  hypotension.   HISTORY OF PRESENT ILLNESS:  The patient presents for followup.  He is  77 years old.  He actually has been getting lightheaded and has been  noticing his blood pressure drop into the 90s.  I did go up on his  carvedilol to 12.5 mg twice a day at the last visit.  This was an  increase from 9.375.  He has otherwise done well.  He is not having any  new shortness of breath.  He is not having any PND or orthopnea.  He has  not had any palpitations.  He is having no chest pain.  He has been  working vigorously in his garden.   PAST MEDICAL HISTORY:  1. Cardiomyopathy (nonischemic and normal coronary arteries on      catheterization and an ejection fraction of 25%.  It improved to      35%), status post ICD/CRT (Medtronic Insync).  2. Hypertension.  3. Sinus bradycardia.  4. Left bundle-branch block.   ALLERGIES:  PENICILLIN.   MEDICATIONS:  1. Lasix 10 mg daily.  2. Enalapril 5 mg b.i.d.  3. Astelin.  4. Nasarel.  5. Mucinex.  6. Advair 100/50 b.i.d.  7. Singulair.  8. Zegerid.  9. Carvedilol 12.5 mg b.i.d..   REVIEW OF SYSTEMS:  As stated in the HPI and otherwise negative for  other systems.   PHYSICAL EXAMINATION:  GENERAL:  The patient is in no distress.  VITAL SIGNS:  Blood pressure 132/70, heart rate 60 and regular, weight  148 pounds, body mass index 24.  HEENT:  Eyelids unremarkable, pupils equal round and reactive to light,  fundi not visualized, oral mucosa unremarkable.  NECK:  No jugular venous distention at 45 degrees, carotid upstroke  brisk and symmetrical, no bruits, no thyromegaly.  LYMPHATICS:  No cervical, axillary, or inguinal adenopathy.  LUNGS:  Clear to auscultation bilaterally.  BACK:  No costovertebral angle tenderness.  CHEST:  Well-healed ICD pocket.  HEART:  PMI not displaced or sustained, S1 and S2 within normal limits,  no S3, no S4, no clicks, no rubs, no murmurs.  ABDOMEN:  Flat, positive bowel sounds, normal in frequency and pitch, no  bruits, no rebound, no guarding, no midline pulsatile mass, no  hepatomegaly, no splenomegaly.  SKIN:  No rashes, no nodules.  EXTREMITIES:  Pulses 2+, no edema.   EKG, sinus rhythm with ventricular pacing 100% capture.   1. Cardiomyopathy.  The patient is having some lightheaded episodes      and hypotension.  He is clearly not tolerating the 12.5 b.i.d. of      carvedilol.  Therefore, we will go back down to 6.25 mg twice a      day.  He will let me know if he  continues to have these episodes.      He is otherwise euvolemic and will continue the medications as      listed.  2. Status post defibrillator.  It is up to date and will continue to      have this followed and has an appointment with Dr. Ladona Ridgel.  3. Followup.  I will see the patient again in about 2 months, although      he can defer this to 4 months if he is feeling well.     Rollene Rotunda, MD, [facility]  Electronically Signed    JH/MedQ  DD: 11/14/2007  DT: 11/15/2007  Job #: [id number]   cc:   Arta Silence, MD

## 2010-08-22 NOTE — Assessment & Plan Note (Signed)
[facility] OFFICE NOTE   NAME:[name], [name]                MRN:          [id number]  DATE:05/15/2007                            DOB:          [birth year]    PRIMARY:  Dr. Laurita Quint.   REASON PRESENTATION:  Evaluate the patient with cardiomyopathy.   HISTORY OF PRESENT ILLNESS:  The patient is 77 years old.  He has done  well since I last saw him.  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  He denies any shortness of breath.  Has no  chest discomfort, neck or arm discomfort.  He has no palpitations,  presyncope or syncope.   PAST MEDICAL HISTORY:  Cardiomyopathy (nonischemic.  Normal coronary  arteries in catheterization.  The EF had been 25% improved to 35%.),  status post Medtronic ICD/CRT, hypertension, sinus bradycardia, left  bundle branch block.   ALLERGIES:  PENICILLIN.   MEDICATIONS:  Enalapril 5 mg twice a day, Astelin, Mucinex, Singulair,  Zegerid 40 mg every other day, prednisolone, carvedilol 9.375 mg b.i.d.,  nabumetone.   REVIEW OF SYSTEMS:  Positive for diarrhea.  Negative for other systems.   PHYSICAL EXAMINATION:  The patient is in no distress.  The blood pressure 120/74, heart rate 60 and regular, weight 149 pounds.  HEENT:  Eyes are, pupils equal, round, reactive to fundi not visualized,  oral mucosa normal.  NECK:  No jugular distention at 45 degrees, carotid upstroke brisk and  symmetrical.  No bruits, thyromegaly.  Lymphatics.  No adenopathy.  LUNGS:  Clear to auscultation bilaterally.  BACK:  No costovertebral tenderness.  CHEST:  Well-healed ICD pocket.  HEART:  PMI not displaced or sustained, S1-S2 within normal so S3-S4, no  clicks, rubs, murmur.  ABDOMEN:  Flat, positive bowel sounds normal frequency and pitch, no  bruits, rebound, guarding or midline pulsatile mass, organomegaly.  SKIN:  No rashes.  EXTREMITIES:  2+ pulse, no  edema.   EKG sinus rhythm with 100% ventricular pacing.   ASSESSMENT/PLAN:  1. Cardiomyopathy.  Today I will increased carvedilol 12 mg b.i.d..      No further cardiovascular testing is suggested at this point.  2. Defibrillator / ICD.  He is due to have follow-up in the office      this fall with Dr. Ladona Ridgel.  He has      phone follow-up.  3. Follow-up will see the patient again in 6 months or sooner if      needed.     Rollene Rotunda, MD, [facility]  Electronically Signed    JH/MedQ  DD: 05/15/2007  DT: 05/16/2007  Job #: [id number]   cc:   Arta Silence, MD

## 2010-08-22 NOTE — Assessment & Plan Note (Signed)
[facility]                            CARDIOLOGY OFFICE NOTE   NAME:[name], [name]                MRN:          [id number]  DATE:01/13/2008                            DOB:          1931/07/14    PRIMARY CARE PHYSICIAN:  Arta Silence, MD.   REASON FOR PRESENTATION:  Evaluate the patient with cardiomyopathy.   HISTORY OF PRESENT ILLNESS:  The patient is 77 years old.  He returns  for followup of the above.  Since I last saw him, he has been seen by  [facility] and has had normal ICD check.  He has  done well.  He is not having any shortness of breath.  He has had no PND  or orthopnea.  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  He is  not doing as much activity, but still remains quite active.  He feels  less lightheaded with his reduction in Coreg that we did a couple of  appointments ago.  He clearly did not tolerate 12.5 mg twice a day.   PAST MEDICAL HISTORY:  Cardiomyopathy (nonischemic and normal coronary  arteries on catheterization with an ejection of 25%, improved to 35%),  status post ICD/CRT (Medtronic InSync), hypertension, sinus bradycardia,  and left bundle branch block.   ALLERGIES:  PENICILLIN.   MEDICATIONS:  1. Lasix 10 mg daily.  2. Enalapril 5 mg b.i.d.  3. Astelin.  4. Nasarel.  5. Mucinex.  6. Advair.  7. Singulair 10 mg every other day.  8. Zegerid 40 mg every other day.  9. Carvedilol 6.25 mg b.i.d.   REVIEW OF SYSTEMS:  As stated in the HPI and otherwise negative for  other systems.   PHYSICAL EXAMINATION:  GENERAL:  The patient is in no distress.  VITAL SIGNS:  Blood pressure 130/72, heart rate is 16 and regular, and  weight 150 pounds.  HEENT:  Eyelids are unremarkable, pupils are equal, round, and reactive  to light, fundi not visualized, oral mucosa unremarkable.  NECK:  No  jugular venous distention at 45 degrees, carotid upstroke brisk and  symmetrical.  No bruits,  no thyromegaly.  LUNGS:  Clear to auscultation bilaterally.  BACK:  No costovertebral angle tenderness.  CHEST:  Well-healed ICD pocket.  HEART:  PMI not displaced or sustained, S1 and S2 within normals, no S3,  no S4, no clicks, no rubs, no murmurs.  ABDOMEN:  Flat, positive bowel sounds.  Normal in frequency and pitch,  no bruits, no rebound, no guarding, no midline pulsatile mass.  No  organomegaly.  SKIN:  No rashes, no nodules.  EXTREMITIES:  Pulses are 2+ and no edema.   ASSESSMENT AND PLAN:  1. Cardiomyopathy.  The patient is doing well with the medications as      listed.  He will not tolerate up titration and so we will leave him      on the doses as they are.  2. Status post defibrillator.  He is doing well and having routine      followup.  3. Followup.  I will see the patient again  in 6 months or sooner if      needed.     Rollene Rotunda, MD, [facility]  Electronically Signed    JH/MedQ  DD: 01/13/2008  DT: 01/13/2008  Job #: [id number]   cc:   Arta Silence, MD

## 2010-08-22 NOTE — Progress Notes (Signed)
[facility] OFFICE NOTE   NAME:[name], [name] BONANO                MRN:          [id number]  DATE:12/08/2007                            DOB:          02-Feb-1932    [name] returns today for followup.  He is a pleasant male with a  history of an ischemic cardiomyopathy, congestive heart failure, left  bundle-branch block, who is status post BiV ICD insertion.  He returns  today for followup.  He has been stable, but has noted some dietary  indiscretion with going out to eat and sodium intake.  He denies chest  pain.  He denies peripheral edema.   MEDICATIONS:  1. Enalapril 5 twice a day.  2. Mucinex 400 daily.  3. Singulair 10 a day.  4. Zegerid 40 a day.  5. Flunisolide 25 mcg spray daily.  6. Coreg 12.5 half tablet twice daily.   PHYSICAL EXAMINATION:  GENERAL:  He is a pleasant, well-appearing 77-  year-old man in no acute distress.  VITAL SIGNS:  Blood pressure today was 118/62, the pulse 60 and regular,  respirations were 18, the weight was 148 pounds.  NECK:  No jugular vein distention.  LUNGS:  Clear bilaterally to auscultation.  No wheezes, rales, or  rhonchi are present.  CARDIOVASCULAR:  Regular rate and rhythm.  Normal S1 and S2.  There are  no murmurs, rubs, or gallops.  EXTREMITIES:  No edema.   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  The P-waves are 2, the R-waves are 6, the impedance 504 in the  A, 576 in the RV, and 432 in the LV; threshold volt of 0.2 in the right  atrium and the right ventricle and a volt of 0.3 in the left ventricle.  His battery voltage was 3.07 volts.  He was 99% V-paced, 50% A-paced.   IMPRESSION:  1. Nonischemic cardiomyopathy.  2. Congestive heart failure.  3. Left bundle-branch block.  4. Status post implantable cardioverter-defibrillator insertion.   DISCUSSION:  Overall, [name] is stable.  He does  have some  dietary indiscretion with sodium and I have discussed the importance of  maintaining a low-salt diet at this time.  His heart failure medications  are very well titrated.  He will continue with his very active lifestyle  raising vegetables in retirement.  I will plan to see the patient back  in the office for followup in 1 year.      [name]. [name] Ridgel, MD  Electronically Signed    GWT/MedQ  DD: 12/08/2007  DT: 12/09/2007  Job #: [id number]   cc:   [name] Silence, MD

## 2010-08-22 NOTE — Assessment & Plan Note (Signed)
[facility] OFFICE NOTE   NAME:[name], [name]                MRN:          [id number]  DATE:08/29/2006                            DOB:          02-23-1932    PRIMARY:  Arta Silence, M.D.   REASON FOR PRESENTATION:  Evaluate the patient with cardiomyopathy.   HISTORY OF PRESENT ILLNESS:  The patient is 77 years old.  The last  visit he was not tolerating Coreg, because he thought it was making him  gassy.  He switched then to bisoprolol.  However, he felt very  fatigued on this and started back on 3.125 mg b.i.d. of Coreg.  He has  had no new shortness of breath.  He denies any PND or orthopnea.  He has  had no palpitations, pre-syncope, or syncope.  He denies any chest pain.   PAST MEDICAL HISTORY:  Cardiomyopathy (nonischemic with normal coronary  arteries on catheterization.  EF had been about 25%.  Improved to 35%  most recent echo in April of last year).  Status post ICD/CRT (Medtronic  InSync).  Hypertension.  Sinus bradycardia.  Left bundle branch block.   ALLERGIES:  PENICILLIN.   CURRENT MEDICATIONS:  1. Lasix 10 mg daily.  2. Enalapril 5 mg b.i.d.  3. Astelin 30 mg daily.  4. Nasarel 29 mcg daily.  5. Mucinex.  6. Advair.  7. Singulair 10 mg daily.  8. Clarinex 5 mg daily.  9. Zegerid 40 mg daily.  10.Coreg 3.125 mg b.i.d.   REVIEW OF SYSTEMS:  As stated in the HPI and otherwise negative for  other systems.   PHYSICAL EXAMINATION:  The patient is in no distress.  Blood pressure 122/70, heart rate 60 and regular, weight 148 pounds.  NECK:  No jugular venous distension at 45 degrees, carotid upstroke  brisk and symmetric, no bruits, thyromegaly.  LUNGS:  Clear to auscultation bilaterally.  HEART:  PMI not displaced or sustained, S1 and S2 within normal limits,  no S3, no S4, no clicks, rubs, murmurs.  ABDOMEN:  Flat, positive bowel sounds, normal in frequency and pitch, no  bruits, rebound, guarding.  No midline pulsatile masses, organomegaly.  Marland Kitchen  SKIN:  No rashes, no nodules.  EXTREMITIES:  With 2+ pulses throughout, no edema.   ASSESSMENT AND PLAN:  1. Cardiomyopathy.  Today we will keep him on his Coreg.  He is going      to increase to 6.25 mg at night with 3.125 in the day.  After a few      days of this, he is going to double to 6.25 mg b.i.d.  2. Followup.  I will see him back in 4 weeks for med titration.     Rollene Rotunda, MD, [facility]  Electronically Signed    JH/MedQ  DD: 08/29/2006  DT: 08/29/2006  Job #: [id number]

## 2010-08-22 NOTE — Assessment & Plan Note (Signed)
[facility]                         ELECTROPHYSIOLOGY OFFICE NOTE   NAME:[name], [name]                MRN:          [id number]  DATE:11/12/2006                            DOB:          [birth year]    SUBJECTIVE:  [name] returns today for followup.  He is a very  pleasant male with a history of an ischemic cardiomyopathy, status post  ICD insertion, with congestive heart failure, status post Bi-V ICD  insertion.  He returns today for followup and overall feels well.  He is  very active and gardening.  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  He denies chest pain.  He denies shortness of breath.  He denies peripheral edema.   PHYSICAL EXAMINATION:  GENERAL:  He is a pleasant well-appearing,  elderly man, in no distress.  VITAL SIGNS:  Blood pressure 120/72, pulse 60 and regular, respirations  18, weight 147 pounds.  NECK:  No jugular venous distention.  LUNGS:  Clear bilaterally to auscultation.  No wheezes, rales or rhonchi  were present.  EXTREMITIES:  No edema.   MEDICATIONS:  1. Lasix p.r.n.  2. Enalapril 5 mg twice daily.  3. Coreg 6.25 mg twice daily.   Interrogation of his defibrillator demonstrates a Medtronic In [employer].  The P-waves and R-waves were 2 and 6 respectively.  The  impedance was 528 in the atrium and 600 in the right ventricle and 440  in the left ventricle, a threshold of 0.2 in the right atrium, right  ventricle and left ventricle.  Battery voltage was 3.13 volts.  He is  now just over one year out from his implant.  There are no inter-current  or IC therapies.   IMPRESSION:  1. Non-ischemic cardiomyopathy.  2. Congestive heart failure.  3. Status post Bi-V implantable cardioverter defibrillator insertion.   DISCUSSION:  Overall [name] is stable and his defibrillator is  working normally.  We will see him back in the office in one year, for  ICD followup.     Doylene Canning.  [name] Ridgel, MD  Electronically Signed    GWT/MedQ  DD: 11/12/2006  DT: 11/12/2006  Job #: [id number]

## 2010-08-22 NOTE — Assessment & Plan Note (Signed)
[facility] OFFICE NOTE   NAME:Schooler, [name]                MRN:          [id number]  DATE:10/02/2006                            DOB:          May 26, 1931    REASON FOR PRESENTATION:  Evaluate patient with cardiomyopathy.   HISTORY OF PRESENT ILLNESS:  Patient presents for medication titration.  We are a little bit confused on what he is taking currently.  I think he  is taking 6.125 mg b.i.d. as per my previous note.  He did not bring his  pills with him, and he is not sure of the dose.  He says he is having no  symptoms.  He actually works quite a bit in his garden.  He says he is  sweating and having to drink quite a bit of fluid.  He has not been  having any shortness of breath.  He has great energy.  He has no chest  pain.   PAST MEDICAL HISTORY:  1. Cardiomyopathy (nonischemic with normal coronary arteries on      catheterization.  EF has been about 25%.  I improved to 35% on the      most recent echo in April 2008).  Status post ICD/CRT (Medtronic      InSync).  2. Hypertension.  3. Sinus bradycardia.  4. Left bundle branch block.   ALLERGIES:  PENICILLIN.   MEDICATIONS:  1. Lasix 10 mg daily.  2. Enalapril 5 mg b.i.d.  3. Astelin 30 mg daily.  4. Nasarel.  5. Mucinex.  6. Advair.  7. Singulair 10 mg daily.  8. Clarinex.  9. Zegerid.  10.Coreg.   REVIEW OF SYSTEMS:  As stated in the HPI, and otherwise negative for  other systems.   PHYSICAL EXAMINATION:  The patient is in no distress.  Blood pressure 120/58.  Heart rate is 62 and regular.  HEENT:  Eyes unremarkable.  Pupils equal, round, and reactive to light.  Fundi not visualized.  Oral mucosa unremarkable.  NECK:  No jugular venous distention.  Wave form within normal limits.  Carotid upstroke brisk and symmetric.  No bruits.  No thyromegaly.  LYMPHATICS:  No adenopathy.  LUNGS:  Clear to auscultation bilaterally.  BACK:  No  costovertebral angle tenderness.  CHEST:  ICD pocket intact.  HEART:  PMI not displaced or sustained.  S1 and S2 within normal limits.  No S3.  No S4.  No clicks.  No rubs.  No murmurs.  ABDOMEN:  Flat.  Positive bowel sounds.  Normal in frequency and pitch.  No bruits.  No rebound.  No guarding.  No midline pulsatile mass.  No  organomegaly.  SKIN:  No rashes.  No nodules.  EXTREMITIES:  Two plus pulses.  No edema.   ASSESSMENT AND PLAN:  1. Cardiomyopathy.  Today I am going to have him call back.  I am      going to clarify that his Coreg is actually 6.25 b.i.d., and      probably increase this to 9.375 b.i.d.  He will continue his other      medications as listed.  I have asked him to stop his Lasix since he      is out exercising and sweating quite a bit.  2. Followup.  I will see him back in about 1 month for the next      medication titration.     Rollene Rotunda, MD, [facility]  Electronically Signed    JH/MedQ  DD: 10/02/2006  DT: 10/02/2006  Job #: [id number]   cc:   Arta Silence, MD

## 2010-08-22 NOTE — Assessment & Plan Note (Signed)
[facility]                            CARDIOLOGY OFFICE NOTE   NAME:[name], [name]                MRN:          [id number]  DATE:11/29/2006                            DOB:          1931-08-26    PRIMARY CARE PHYSICIAN:  Arta Silence, MD   REASON FOR PRESENTATION:  Evaluate patient with cardiomyopathy and  fatigue.   HISTORY OF PRESENT ILLNESS:  The patient returns for followup of the  above.  He had been tired and so he dropped his Coreg from 9.375 b.i.d.  to 6.25 b.i.d.; however, he didn't notice any change in his blood  pressure or how he feels.  He actually, in retrospect, says that he is  not as tired as he thinks he should be for the level of activity that he  is doing.  He seems to wax and wane in his energy level, somewhat  independent of these medications changes and he recognizes this now.  He  would very much like to go back up on the dose of Coreg.  I did check an  echocardiogram which demonstrated that his ejection fraction has  improved slightly, up to 35%.  He had blood work which was unremarkable  except for slightly low platelets.  He denies any new shortness of  breath and has no PND or orthopnea.  He has had no chest pain,  presyncope or syncope.   PAST MEDICAL HISTORY:  1. Cardiomyopathy (nonischemic with normal coronary arteries on      catheterization and ejection fraction of 25%, improved to 35% now),      status post ICD/CRT, Medtronic In Sync.  2. Hypertension.  3. Sinus bradycardia.  4. Left bundle branch block.   ALLERGIES:  PENICILLIN.   MEDICATIONS:  1. Lasix p.r.n.  2. Enalapril 5 mg b.i.d.  3. Astelin.  4. Mucinex 400 mg daily.  5. Advair p.r.n.  6. Singulair 10 mg daily.  7. Zegerid 40 mg every other day.  8. Coreg 6.25 mg b.i.d.  9. Equate.  10.Flunisolide.   REVIEW OF SYSTEMS:  Review of systems is as stated in the HPI and  otherwise negative for other systems.   PHYSICAL EXAMINATION:   GENERAL APPEARANCE:  The patient is in no  distress.  VITAL SIGNS:  Blood pressure 130/68, heart rate 60 and regular, weight  144.08 pounds.  HEENT:  Eyes unremarkable.  Pupils equal, round, reactive to light.  Fundi not visualized.  Oral mucosa unremarkable.  NECK:  No jugular venous distention.  Wave form within normal limits.  Carotid upstroke brisk and symmetric.  No bruits.  No thyromegaly.  LYMPHATICS:  No cervical, axillary or inguinal adenopathy.  LUNGS:  Clear to auscultation bilaterally.  BACK:  No costovertebral angle tenderness.  CHEST:  Well-healed ICD pocket.  HEART:  PMI not displaced or sustained.  S1, S2 within normal limits.  No S3.  No S4.  No clicks.  No rubs.  No murmurs.  ABDOMEN:  Flat, positive bowel sounds, normal in frequency and pitch.  No bruits.  No organomegaly.  SKIN:  No rashes, no nodules.  EXTREMITIES:  2+ pulses,  no edema.   CLINICAL DATA:  Electrocardiogram:  Sinus rhythm with ventricular paced  beats, 100% capture.   ASSESSMENT/PLAN:  1. Cardiomyopathy.  The patient is actually feeling well today.  I am      going to increase his Coreg back to 9.375 mg b.i.d.  He will remain      on the other medications as listed.  2. Defibrillator.  The patient is up-to-date in followup.  He said he      did feel a pull on his defibrillator when he reached up recently.      The electrocardiogram is unremarkable.  He will continue his      routine followup.   FOLLOWUP:  I would like to see him back in about a month or sooner if  needed.     Rollene Rotunda, MD, [facility]  Electronically Signed    JH/MedQ  DD: 11/29/2006  DT: 11/30/2006  Job #: [id number]   cc:   Arta Silence, MD

## 2010-08-25 NOTE — Assessment & Plan Note (Signed)
[facility]                   COUMADIN / CHRONIC HEART FAILURE CLINIC NOTE   NAME:[name], [name]                MRN:          [id number]  DATE:12/06/2005                            DOB:          [birth year]    [name] returns today for further evaluation and medication titration  for his heart failure secondary to nonischemic cardiomyopathy.  [name] states he has been feeling quite well.  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  He denies any problems with his  medications.   PAST MEDICAL HISTORY:  1. Congestive heart failure, class I to II, and nonischemic      cardiomyopathy, status post cardiac catheterization with normal      coronaries, and an ejection fraction of 25% to 30%.  The patient is      status post insertion of a biventricular Medtronic implantable      cardioverter-defibrillator with CRT (Medtronic InSync).  2. Hypertension.  3. Sinus brady.  4. Left bundle branch block.   REVIEW OF SYSTEMS:  As stated above, otherwise negative.   MEDICATIONS:  Currently include:  1. Lasix 10 mg daily.  2. Enalapril 5 mg b.i.d.  3. Astelin nasal spray.  4. Nasarel.  5. Mucinex.  6. Advair.  7. Singulair.  8. Clarinex.  9. Zegerid.  10.Coreg 6.25 mg b.i.d.   PHYSICAL EXAMINATION:  Weight 149 pounds, blood pressure 125/67, with a  pulse of 49.  [name] is in no acute distress, very pleasant and  cooperative 77 year old Caucasian gentleman.  Normocephalic, atraumatic.  NECK:  Supple without lymphadenopathy, negative bruit, negative JVD.  LUNGS:  Clear to auscultation bilaterally.  CARDIOVASCULAR:  Exam reveals a normal S1, S2, bradycardic.  ABDOMEN:  Soft, nontender, positive bowel sounds.  EXTREMITIES:  Without clubbing, cyanosis or edema.  The patient is alert and oriented x3.   IMPRESSION:  The patient was stable  class I to II heart failure, status post  implant of cardioverter-defibrillator with CRT therapy.  I am concerned the  patient's heart rate was slightly  low.  I had [name] check his settings.  His pacer is set at 50.  I am going  to have them increase it to 60.  We will continue the Coreg at 6.25 mg twice  a day now, and continue other medications.                                 [name], ACNP    MB/MedQ  DD:  12/06/2005  DT:  12/07/2005  Job #:  [id number]

## 2010-08-25 NOTE — Op Note (Signed)
   NAME:  [name], [name] NO.:  [id number]   MEDICAL RECORD NO.:  [id number]                   PATIENT TYPE:  AMB   LOCATION:  DSC                                  FACILITY:  [facility]   PHYSICIAN:  Thornton Park. Daphine Deutscher, M.D.             DATE OF BIRTH:  09-26-1931   DATE OF PROCEDURE:  01/30/2002  DATE OF DISCHARGE:                                 OPERATIVE REPORT   PREOPERATIVE DIAGNOSIS:  Right inguinal hernia.   POSTOPERATIVE DIAGNOSIS:  Right indirect inguinal hernia with lipoma of the  cord.   PROCEDURE:  Right inguinal herniorrhaphy with mesh.   SURGEON:  Thornton Park. Daphine Deutscher, M.D.   ANESTHESIA:  General by LMA.   DESCRIPTION OF PROCEDURE:  The patient was taken to room six on Friday,  October 24 and given general anesthesia.  The inguinal region was shaved and  prepped with Betadine and draped sterilely.  An oblique incision was made  and carried down to the external oblique, which was incised.  The  ilioinguinal nerve branch was preserved and retracted with the superior flap  of external oblique fascia.  The cord was mobilized and freed up.  Medially,  hernia was dissected free from the cord structures, opened, and the excess  was removed.  I put my finger in and could feel bowel loops and no other  hernia defects.  I then oversewed this closed with a running 2-0 Vicryl.  I  allowed this to retract up in the abdomen.  I approximated the dilated ring  with a single 2-0 silk.  I then repaired the floor and the ring with a piece  of Marlex mesh cut to fit and sutured along the inguinal ligament with a  running 2-0 Prolene.  This was done superiorly and then laterally sutured to  itself with a horizontal mattress suture of 2-0 Prolene and then tacked  laterally with a separate stitch of 2-0 Prolene.  This being tucked beneath  the external oblique, the external oblique was then closed with a running 2-  0 Vicryl.  A 4-0 Vicryl was used in the  subcutaneous tissue and  subcuticularly 5-0 Vicryl was used, and then benzoin and Steri-Strips.  The  patient seemed to tolerate this procedure well.  The area was infiltrated  with lidocaine.   FINAL DIAGNOSIS:  Right indirect inguinal hernia.                                                Thornton Park Daphine Deutscher, M.D.    MBM/MEDQ  D:  01/30/2002  T:  01/31/2002  Job:  [id number]   cc:   Molly Maduro S_____________, M.D.   Ronald L. Ovidio Hanger, M.D.  [address]  [place]  [place] [address]  Fax: [phone]

## 2010-08-25 NOTE — Assessment & Plan Note (Signed)
[facility]                   COUMADIN / CHRONIC HEART FAILURE CLINIC NOTE   NAME:[name], [name]                MRN:          [id number]  DATE:10/22/2005                            DOB:          Aug 11, 1931    HISTORY OF PRESENT ILLNESS:  [name] returns today for further  evaluation and medication titration for his heart failure secondary to  nonischemic cardiomyopathy.  [name] states he has been doing quite  well.  He is recently status post biventricular Medtronic implantable  cardioverter defibrillator by Dr. Ladona Ridgel in May 2007.  The patient initially  could not tell any improvement in his dyspnea on exertion.  Today, he states  he is able to tell an improvement in his breathing.  He can lift a 50 pound  bag of fertilizer without becoming short of breath.  He experiences just  mild dyspnea on exertion.  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  He denies any firing of his  defibrillator.  He denies any orthopnea, PND or peripheral edema.  Overall,  he is doing quite well.  He recently saw Dr. Hetty Ely, his primary care  physician, at which time he had blood work done.  He was noted to have a  hemoglobin of 11.6 and a hematocrit of 34.6, BNP of 261, BUN 23, creatinine  1.0.  Apparently, Dr. Hetty Ely gave [name] some hemoccult cards to do  at home.  Interestingly enough, looking back on the patient's blood work, he  had a normal hemoglobin last fall of 13.9 and in May 2007 his hemoglobin was  11.8, and now he has drifted down to 11.6.  [name] also has a history  of ulcers in the past.  He states he is on Zegerid 40 mg.  He states he just  takes it p.r.n.  The patient is going to follow up with Dr. Hetty Ely  regarding his anemia.   PAST MEDICAL HISTORY:  1.  Congestive heart failure class I-II and nonischemic cardiomyopathy      status post cardiac  catheterization with normal coronary ejection      fraction 25-30% status post insertion of biventricular Medtronic      implantable cardioverter defibrillator with CRT (Medtronic InSync      biventricular implantable cardioverter defibrillator).  2.  Hypertension.  3.  Sinus brady.  4.  For other medical history, see last H&P.   REVIEW OF SYSTEMS:  As stated above, otherwise, negative.  The patient  denies any melena, hemoptysis, epistaxis.   MEDICATIONS:  1.  Lasix 10 mg daily.  2.  Enalapril 5 mg b.i.d.  3.  Astelin 30 mg intranasal daily.  4.  Nasarel 29 mcg daily.  5.  Mucinex 400 mg daily.  6.  Advair 100/50 b.i.d.  7.  Singulair 10 mg daily.  8.  Clarinex 5 mg daily.  9.  Coreg 6.25 mg b.i.d.  10. Zegerid 40 mg p.r.n.   PHYSICAL EXAMINATION:  VITAL SIGNS:  Weight 148, blood pressure 122/64,  pulse 60.  GENERAL APPEARANCE:  Patient in no acute distress.  Very pleasant,  cooperative  gentleman in no acute distress.  NECK:  No jugular venous distention at 45 degrees.  LUNGS:  Clear to auscultation bilaterally.  CARDIOVASCULAR:  Normal S1, S2.  Status post defibrillator site to the left  chest, well granulated.  ABDOMEN:  Soft, nontender.  Positive bowel sounds.  EXTREMITIES:  Without cyanosis, clubbing or edema.  NEUROLOGICAL:  Patient alert and oriented x2.  Cranial nerves II-XII grossly  intact.   IMPRESSION:  The patient has stable class I heart failure status post  implant by the ICD.  Last ejection fraction of 30% by echocardiogram in  April 2007.  The patient has some improvement in his shortness of breath  since defibrillator was placed.  Tolerating his current medications.  The  patient has a follow up appointment with Dr. Ladona Ridgel August 21.  I will see  him back in the heart failure clinic in six weeks.  The patient is to follow  up with Dr. Hetty Ely concerning his anemia.                                 Dorian Pod, ACNP    MB/MedQ  DD:  10/22/2005   DT:  10/22/2005  Job #:  [id number]   cc:   Arta Silence, MD

## 2010-08-25 NOTE — Op Note (Signed)
NAME:  YEE, JOSS NO.:  [id number]   MEDICAL RECORD NO.:  [id number]          PATIENT TYPE:  OIB   LOCATION:  1431                         FACILITY:  [facility]   PHYSICIAN:  Mark C. Vernie Ammons, M.D.  DATE OF BIRTH:  1931-07-01   DATE OF PROCEDURE:  DATE OF DISCHARGE:                                 OPERATIVE REPORT   PREOPERATIVE DIAGNOSIS:  Erectile dysfunction.   POSTOPERATIVE DIAGNOSIS:  Erectile dysfunction.   PROCEDURE:  Insertion of penile prosthesis.   SURGEON:  Mark C. Vernie Ammons, M.D.   ASSISTANT:  Glade Nurse, M.D.   ANESTHESIA:  General.   BLOOD LOSS:  Minimal.   DRAINS:  None.   IMPLANT:  Mentor Titan 18 cm inflatable penile prosthesis with bilateral 2  cm rear-tip extenders and Titan Bioflex 60 mL reservoir with lockout valve.   PROCEDURE:  The patient was identified by his wrist bracelet and brought to  room 12 where he received preprocedure antibiotics. He was then administered  general anesthesia, prepped and draped in the usual sterile fashion taking  great care to minimize the risk of peripheral neuropathy or compartment  syndrome. Next, we placed an 18-French Foley catheter to the level of his  urinary bladder, clear urine output was obtained. The bladder was drained,  Foley catheter was capped. Next we made a midline penile scrotal incision  over approximately 5 cm in length along his median raphe. We carried down  through the dermis and dartos fascia with Bovie cautery. We then bluntly  exposed the corporal bodies. We set our scout retractor. We then sharply  dissected off the remaining layers of tissue over the bilateral corporal  bodies at the level of penoscrotal junction. We then placed in each corporal  body 2-0 Vicryl holding stitches both medially and laterally at the same  level. Next, we made our corporotomy with the 15 blade knife and extended  over approximately 1 cm in length with curved Mayo scissors. Next, we  dilated  the patient's proximal and distal corpora sequentially with Mentor  Brooks dilators from 8-14 [place] in sequential fashion. This was done  without difficulty. We then repeated this process on the contralateral side.  We then measured each corporal body. Each was found to be a total length of  20 cm with 8 cm to the mid portion of the corporotomy. Next we developed a  subdartos plane bluntly and while the IPP was being prepared on the back  table we placed additional preplaced closure sutures in a corporotomy on  each side of the corporotomy above and below our aforementioned holding  stitches. Next we bluntly developed our space for our reservoir. We followed  the left cord to the external ring. We palpated medially and pierced through  the transversalis fascia with the surgeon's first finger. Prior to doing  this the bladder was decompressed. We bluntly developed the space of  Retzius. We then placed our 60 cm reservoir pump, it was inflated with 60 mL  of sterile saline. There was no noted back pressure, the tubing was shotted.  Next we prepared our IPP device, placed the bilateral rear-tip  extenders  using the furlough with the needle. We brought the needle out the lateral  glans bilaterally and removed the furlough. Each cylinder was then placed  both proximally and distally. Next we placed approximately 30 mL of normal  saline into the cylinders, excellent fit was noted, good contour was noted  with a straight phallus. Excellent proximal and distal fit was noted and the  cylinders extended past the mid glans bilaterally in a symmetric fashion.  There was no buckling noted at the corporotomy sites. Next, we removed all  but approximately 10 mL  of normal saline from the cylinders. The tubing was  shotted. We next closed our corporotomy with the preplaced Vicryl sutures.  Next, we placed our pump in our subdartos pouch and the entry to this pouch  was closed with figure-of-eight 2-0  Vicryl. Next we, using the connector  kit, trimmed down our tubing length and using the connector kit connected  the pump and cylinder devices to the reservoir. Following this we cycled the  device several times, again excellent contour and fit was noted. The device  was noted to cycle normally. We did leak a small amount of saline within the  corporal bodies. Next we closed the incision after copiously irrigating the  wound with antibiotic soaked saline. We closed the dartos with running 3-0  Vicryl and closed the skin with a running 3-0 Vicryl. Dressings were  applied. Foley catheter was attached to drainage bag. Clear urine output was  noted. The patient was reversed from his anesthesia which he tolerated  without complication. Please note Dr. Ihor Gully was present and  participated in all aspects of this case.     ______________________________  Glade Nurse, MD      Veverly Fells. Vernie Ammons, M.D.  Electronically Signed    MT/MEDQ  D:  06/01/2005  T:  06/02/2005  Job:  [id number]

## 2010-08-25 NOTE — Cardiovascular Report (Signed)
NAME:  [name], [name] NO.:  [id number]   MEDICAL RECORD NO.:  [id number]          PATIENT TYPE:  OIB   LOCATION:  NA                           FACILITY:  [facility]   PHYSICIAN:  Rollene Rotunda, M.D.   DATE OF BIRTH:  09/16/1931   DATE OF PROCEDURE:  06/22/2004  DATE OF DISCHARGE:                              CARDIAC CATHETERIZATION   REFERRING PHYSICIAN:  Laurita Quint, M.D.   PROCEDURES:  1.  Left heart catheterization.  2.  Coronary arteriography.   INDICATIONS FOR PROCEDURE:  The patient with cardiomyopathy and ejection  fraction of 25% without obvious etiology.   DESCRIPTION OF PROCEDURE:  Left heart catheterization was performed through  the right femoral artery.  The artery was cannulated using puncture.  A 4  French arterial sheath was inserted using the modified Seldinger technique.  Judkins and pigtail catheters were utilized.  The patient tolerated the  procedure well and left the lab in stable condition.   HEMODYNAMICS:  Left ventricle:  127/18; 27/85.   Coronaries:  Left main was normal.  The LAD was normal.  There was a small  first diagonal which was normal.  There was a very large second diagonal  which was normal.  The circumflex in the AV groove is normal.  Obtuse  marginal-1 and obtuse marginal-2 were  moderate size and normal.  Posterolateral was small and normal.  The right coronary artery was the  dominant vessel.  It was normal throughout its course.  There was a large  PDA and posterolateral, both of which were normal.   Left ventriculogram:  Left ventriculogram was obtained in the RAO  projection.  The EF was approximately 25-30% with a global hypokinesis and  some inferobasal akinesis.   CONCLUSION:  Severe nonischemic cardiomyopathy.  No obstructive coronary  disease.   PLAN:  The patient will continue to have medical titration of his heart  failure medications.  At this point, I will increase his Enalapril to 5 mg  twice a day.   Will see him back in a couple of weeks in the Heart Failure  Clinic for the next medication titration.      JH/MEDQ  D:  06/22/2004  T:  06/22/2004  Job:  [id number]   cc:   Laurita Quint, M.D.  [address] [place]  [place] [address]  Fax: [phone]

## 2010-08-25 NOTE — Assessment & Plan Note (Signed)
[facility]                           ELECTROPHYSIOLOGY OFFICE NOTE   NAME:Burridge, EWEL LONA                MRN:          [id number]  DATE:11/27/2005                            DOB:          09-19-1931   REASON FOR VISIT:  Mr. Marcello returns today for followup.  He is a very  pleasant 77 year old man with a non-ischemic cardiomyopathy, EF of 30% with  left bundle branch block, status post bi-v ICD insertion.  He returns today  for followup.  He denies chest pain or shortness of breath.  He continues to  be very active and is exercising regularly.   MEDICATIONS:  1. Lasix p.r.n.  2. Enalapril 5 mg b.i.d.  3. Coreg 6.25 mg b.i.d.  4. Assorted allergy medications.   PHYSICAL EXAMINATION:  GENERAL:  This is a pleasant, well-appearing man in  no acute distress.  VITAL SIGNS:  The blood pressure today was 97/51, pulse 53 and regular,  respirations were 18, the weight was 156 pounds.  NECK:  No jugular venous distention.  LUNG:  Clear to auscultation bilaterally.  CARDIOVASCULAR:  Regular rate and rhythm with normal S1 and S2.  EXTREMITIES:  No cyanosis, clubbing, or edema.   Interrogation of his defibrillator demonstrates a Medtronic INSYNC  SENTRY  with P and R waves of 3 and 6.5.  The pacing impedence is 448 in the atrium,  608 in the right ventricle, 416 in the left ventricle.  The atrial threshold  is 1or 0.2, the RV threshold of 1 of 0.2, and the LV threshold of 1 of 0.1.  There are no intercurrent IC therapies.   IMPRESSION:  1. Non-ischemic cardiomyopathy.  2. Congestive heart failure.  3. Left bundle branch block.  4. Status post bi-v ICD insertion.   DISCUSSION:  Overall, Mr. Hollenbach is stable.  His heart failure is well  controlled.  We will plan to see him back in our Heart Failure Clinic in  several months, and I will see him in one year, sooner should he have more  problems.                                   Doylene Canning.  Ladona Ridgel, MD  GWT/MedQ  DD:  11/27/2005 DT:  11/27/2005 Job #:  [id number]   cc:   Arta Silence, MD

## 2010-08-25 NOTE — Assessment & Plan Note (Signed)
[facility]                            CARDIOLOGY OFFICE NOTE   NAME:[name], [name]                MRN:          [id number]  DATE:08/12/2006                            DOB:          19-Dec-1931    PRIMARY CARE PHYSICIAN:  Dr. Laurita Quint.   REASON FOR PRESENTATION:  Evaluate patient with cardiomyopathy.   HISTORY OF PRESENT ILLNESS:  The patient presents for followup of the  above. He is 77 years old. He has done well since I last saw him except  he has had nausea with his Coreg. He does not think he is tolerating  this. He has not had any heart failure symptoms (class 1). He says he  can carry a 50 pound bag at his nursery and not develop any shortness of  breath. He denies any PND or orthopnea. He has had no palpitations, pre  syncope, or syncope. He has had no firings of his defibrillator. He  denies any chest discomfort, neck discomfort, arm discomfort, activity  induced nausea or vomiting, or excessive diaphoresis.   PAST MEDICAL HISTORY:  Cardiomyopathy (nonischemic with normal  coronaries on catheterization. Ejection fraction had been about 25%. It  was improved to 35% on his most recent echocardiogram in April of last  year.), status post ICD/CRT (Medtronic InSync), hypertension, sinus  bradycardia, left bundle branch block.   ALLERGIES:  PENICILLIN.   MEDICATIONS:  1. Lasix 10 mg daily.  2. Enalapril 5 mg b.i.d.  3. Astelin 30 mg daily.  4. Nasarel 29 mcg daily.  5. Mucinex.  6. Advair 100/50 b.i.d.  7. Singulair.  8. Clarinex.  9. Zegerid 40 mg daily.  10.Coreg 6.25 mg q.a.m. and 3.125 p.m.   REVIEW OF SYSTEMS:  As stated in the HPI and otherwise negative for  other systems.   PHYSICAL EXAMINATION:  Patient is in no distress. He is pleasant. Blood  pressure 119/71, weight 151, heart rate 62 and regular.  HEENT: Eye unremarkable, pupils equal, round, and reactive to light.  Fundi not visualized. Oral mucosa  unremarkable.  NECK: No jugular venous distension, wave form within normal limits.  Carotid upstrokes brisk and symmetric. No bruits or thyromegaly.  LYMPHATICS: No cervical, axillary, inguinal adenopathy.  LUNGS: Clear to auscultation bilaterally.  BACK: No costovertebral angle tenderness.  CHEST: Well healed ICD pocket.  HEART: PMI not displaced or sustained. S1, and S2 within normal limits.  No S3. No S4, no rubs.  ABDOMEN: Flat, positive bowel sounds normal to frequency and pitch. No  bruits, rebound, guarding. No midline pulsatile mass. No organomegaly.  SKIN: No rashes.  EXTREMITIES: 2 + pulses. No edema.   EKG: Atrial paced rhythm, ventricular capture 100%.   OptiVol evaluation. The patient had a good patient activity index. He  has a low heart rate variability but it is stable. His cardiothoracic  impedance is slowly decreasing but he is well below threshold.   ASSESSMENT/PLAN:  1. Cardiomyopathy, the patient is not tolerating his beta blocker.      Therefore, I am going to switch him back to 2 and a half mg of  bisoprolol which he tolerated in the past. I will continue to      titrate his medications. I will follow up his OptiVol, though at      this point he appears to be euvolemic. He will continue the other      medications as listed.  2. ICD/CRT, he has routine follow up and I will make sure that this is      up to date. He transmits telephonically.  3. Follow up, I will see him back in the [place] office in a couple      of weeks for medication titration.     Rollene Rotunda, MD, [facility]     JH/MedQ  DD: 08/12/2006  DT: 08/12/2006  Job #: [id number]

## 2010-08-25 NOTE — Op Note (Signed)
NAME:  [name], [name] NO.:  [id number]   MEDICAL RECORD NO.:  [id number]          PATIENT TYPE:  INP   LOCATION:  3734                         FACILITY:  [facility]   PHYSICIAN:  Doylene Canning. Ladona Ridgel, M.D.  DATE OF BIRTH:  April 03, 1932   DATE OF PROCEDURE:  08/31/2005  DATE OF DISCHARGE:                                 OPERATIVE REPORT   PROCEDURE PERFORMED:  Implantation of a biventricular ICD.   INDICATION:  Nonischemic cardiomyopathy, congestive heart failure class III,  and a left bundle branch block with a QRS duration 140 milliseconds.   INTRODUCTION:  The patient is 77 year old male with a history of nonischemic  cardiomyopathy and congestive heart failure (class III) with left bundle  branch block despite maximal medications.  He is now referred for BiV ICD  implantation.   PROCEDURE:  After informed consent was obtained, the patient was taken to  the diagnostic EP lab in the fasting state.  After usual preparation and  draping, intravenous fentanyl and midazolam was given for sedation.  A total  of 30 mL of lidocaine was infiltrated in the left infraclavicular region.  A  7-cm incision was carried out over this region, electrocautery utilized to  dissect down to the fascial plane.  Ten 10 mL of contrast was injected into  the left upper extremity venous system, demonstrating a patent left  subclavian vein.  It was subsequently punctured x3, and the Medtronic model  6949 65-cm active fixation defibrillation lead, serial number [id number]  was advanced to the right ventricle, and a Medtronic model 5076 52-cm active  fixation pacing lead, serial number [id number] was advanced to the right  atrium.  Mapping was carried out in the right ventricle at the final site on  the RV septum.  The R-waves measured 9 mV, and the pacing impedance was 693  ohms with the lead actively fixed.  ______________ with the diaphragm.  With  the RV lead in satisfactory position, attention  was then turned to placement  of atrial lead which was place in the right atrial appendage where P-waves  measured to 2.5 mV and the pacing impedance 478 ohms with the lead actively  fixed.  Pacing threshold was 0.8 volts at 0.5 milliseconds and again 10 volt  pacing did not stimulate the diaphragm.  With both right atrial and right  ventricular lead in satisfactory position, attention was then turned to  placement of left ventricular lead.  The coronary sinus was cannulated with  a 6-French EP CS catheter and the guiding sheath was advanced over the EP  catheter.  Venography was then carried out of the coronary sinus.  This  demonstrated a large posterior vein with a lateral branch and a posterior  branch.  It also demonstrated a tortuous coronary sinus beyond this with one  small high lateral vein.  The lateral branch of the posterolateral vein was  selected for LV lead placement which was carried out without difficulty,  deploying the Medtronic model 4194 88-cm passive fixation lead in the  lateral branch of the posterior vein in the left ventricle.  The LV serial  number  was [id number].  The LV threshold was 0.6 volts at 0.5 milliseconds  with a pacing impedance of 992 ohms.  LV waves measured at 13 mV.  With all  of the above, the leads were secured to subpectoralis fascia with a figure-  of-eight silk suture.  The sewing sleeves were secured with a silk suture.  Electrocautery was utilized to make subcutaneous pocket.  Kanamycin  irrigation was utilized to irrigate the pocket.  Electrocautery was utilized  to assure hemostasis.  The Medtronic model [id number] InSync Sentry serial number  [id number] was connected to the atrial, RV and LV leads and placed in the  subcutaneous pocket.  Generator was secured with a silk suture.  Defibrillation threshold testing was carried out.   After the patient was more deeply sedated with fentanyl and Versed, VF was  induced with T-wave shock, and a  15-joules shock was delivered which  terminated ventricular fibrillation and restored sinus rhythm.  Five minutes  was allowed to elapse and a second DFT test carried out.  Again, VF was  induced with a T-wave shock, and again, a 15-joules shock was delivered  which terminated ventricular fibrillation and restored sinus rhythm.  At  this point, no additional defibrillation threshold testing was carried out,  and the incision was closed with a layer of 2-0 Vicryl followed by layer of  3-0 Vicryl followed by layer of 4-0 Vicryl.  Benzoin was painted on the  skin, Steri-Strips were applied and a pressure dressing was placed, and the  patient was returned to his room in satisfactory condition.   COMPLICATIONS:  There were no immediate procedure complications.   RESULTS:  This demonstrates successful implantation of a Medtronic InSync  biventricular ICD in a patient with nonischemic cardiomyopathy, class III  heart failure and left bundle branch block.           ______________________________  Doylene Canning. Ladona Ridgel, M.D.     GWT/MEDQ  D:  08/31/2005  T:  09/01/2005  Job:  [id number]   cc:   Rollene Rotunda, M.D.  [address]  [address]  [place]  [place] [address]   Laurita Quint, M.D.  Fax: [phone]

## 2010-08-25 NOTE — Assessment & Plan Note (Signed)
[facility]                            CARDIOLOGY OFFICE NOTE   NAME:[name] [name] CIAMPI                MRN:          [id number]  DATE:06/21/2006                            DOB:          June 27, 1931    CONGESTIVE HEART FAILURE CLINIC NOTE:   CARDIOLOGIST:  Rollene Rotunda, MD, [facility]   INTERVAL HISTORY:  Mr. [name] [name] is a delightful 77 year old male with a  history of congestive heart failure secondary to a non-ischemic  cardiomyopathy.  He is status post BiV ICD.  Most recent ejection  fraction from April of 2007 was 30%.   He returns today for a routine follow up and continued medication  titration.  He was actually supposed to see Dr. Antoine Poche, but got put on  my schedule by accident.  He is a bit frustrated with the time he had to  wait to see me, but in otherwise good spirits.  He says he is doing  great.  He is very active around the house with all kinds of activities  and physical labor without any limitation.  He denies any chest pain or  shortness of breath.  No orthopnea, no PND.  ICD has not fired.   PHYSICAL EXAMINATION:  GENERAL:  Well appearing.  Ambulates around the  clinic without any respiratory difficulty.  VITAL SIGNS:  Blood pressure is 124/62, heart rate 60, weight is 154.  HEENT:  Sclerae are anicteric.  Extraocular movements intact.  There is  no xanthelasma.  His mucous membranes are moist.  Oropharynx is clear.  NECK:  Supple.  No JVD.  Carotids are 2+ bilaterally without any bruits.  There is no lymphadenopathy or thyromegaly.  CARDIAC:  He has a regular rate and rhythm.  There is no S3.  No murmur.  LUNGS:  Clear.  ABDOMEN:  Soft, nontender, nondistended.  No hepatosplenomegaly, no  bruits, no masses.  Good bowel sounds.  EXTREMITIES:  Warm with no clubbing, cyanosis, or edema.  No rash.  NEUROLOGIC:  Alert and oriented x3.  Cranial nerves II-XII are intact.  Moves all 4 extremities without difficulty.  Affect is  bright.   ASSESSMENT AND PLAN:  Congestive heart failure secondary to nonischemic  cardiomyopathy.  He is currently doing quite well.  He has had a very  nice response to his CRT therapy.  I would currently put him in a NYHA  Class I.  Will go ahead and try to increase his Coreg to 9.375 mg twice  a day.  He is concerned that this may be causing him some gas, but will  see if he can tolerate it.  Will have him follow up with Dr. Antoine Poche in  a months time.     [name] Buckles. Bensimhon, MD  Electronically Signed   DRB/MedQ  DD: 06/21/2006  DT: 06/22/2006  Job #: [id number]

## 2010-08-25 NOTE — Discharge Summary (Signed)
NAME:  [name], [name] NO.:  [id number]   MEDICAL RECORD NO.:  [id number]          PATIENT TYPE:  INP   LOCATION:  3734                         FACILITY:  [facility]   PHYSICIAN:  Jesse Sans. Wall, M.D.   DATE OF BIRTH:  [birth year]   DATE OF ADMISSION:  08/31/2005  DATE OF DISCHARGE:  09/01/2005                                 DISCHARGE SUMMARY   PROCEDURES:  Insertion of a biventricular Medtronic ICD.   PRIMARY DIAGNOSIS:  Nonischemic cardiomyopathy with an ejection fraction of  25-30%.   SECONDARY DIAGNOSES:  1.  Status post cardiac catheterization with normal coronary arteries.  2.  Congestive heart failure class II.  3.  Gastritis.  4.  Hypertension.  5.  Asthma.  6.  Sinus bradycardia.  7.  Status post penile prosthesis.  8.  Right inguinal hernia repair.  9.  Cardiac catheterization.  10. Allergy or intolerance to penicillin.   TIME OF DISCHARGE:  Thirty-one minutes.   HOSPITAL COURSE:  [name] is a 77 year old male with a history of  nonischemic cardiomyopathy.  He was evaluated by Dr. Ladona Ridgel in consideration  of a biventricular ICD and scheduled for this on Aug 31, 2005.   A Medtronic biventricular ICD was inserted without complication.   On Sep 01, 2005, a chest x-ray showed no pneumothorax.  The ICD was checked  and all values were within normal limits.  [name] was walking without  chest pain or shortness of breath.  He was evaluated by Dr. Daleen Squibb and  considered stable for discharge with outpatient follow-up arranged.   DISCHARGE INSTRUCTIONS:  His activity level is to be per the discharge  instruction sheet.  He is to stick to a diet that is low in salt.  He is to  call our office for any problems with the incision.  He has an appointment  with the [facility] on September 13, 2005 at 9 a.m. and is to see Dr. Ladona Ridgel on  November 27, 2005.  He will follow up with Dr. Antoine Poche and with Dr. Hetty Ely  as needed.   DISCHARGE MEDICATIONS:  1.   Lasix 20 mg 1/2 tablet q.d.  2.  Enalapril 5 mg b.i.d.  3.  Astelin nasal spray daily.  4.  Mucinex 400 mg daily.  5.  Advair 100/50 b.i.d.  6.  Singulair 10 mg a day.  7.  Clarinex 5 mg a day.  8.  Bisoprolol 5 mg a day.  9.  Zegerid 40/1100 mg daily.  10. Nasarel daily.      [name], P.A. [facility]      Thomas C. Wall, M.D.  Electronically Signed    RB/MEDQ  D:  09/01/2005  T:  09/03/2005  Job:  [id number]   cc:   Laurita Quint, M.D.  Fax: [phone]

## 2010-09-06 ENCOUNTER — Ambulatory Visit (INDEPENDENT_AMBULATORY_CARE_PROVIDER_SITE_OTHER): Payer: Medicare Other | Admitting: Emergency Medicine

## 2010-09-06 DIAGNOSIS — Z7901 Long term (current) use of anticoagulants: Secondary | ICD-10-CM

## 2010-09-06 DIAGNOSIS — I4892 Unspecified atrial flutter: Secondary | ICD-10-CM

## 2010-09-06 LAB — POCT INR: INR: 2.4

## 2010-09-25 ENCOUNTER — Encounter: Payer: Self-pay | Admitting: Family Medicine

## 2010-09-25 ENCOUNTER — Ambulatory Visit (INDEPENDENT_AMBULATORY_CARE_PROVIDER_SITE_OTHER): Payer: Medicare Other | Admitting: Family Medicine

## 2010-09-25 ENCOUNTER — Telehealth: Payer: Self-pay | Admitting: *Deleted

## 2010-09-25 VITALS — BP 122/70 | HR 68 | Temp 97.7°F | Wt 144.1 lb

## 2010-09-25 DIAGNOSIS — R0981 Nasal congestion: Secondary | ICD-10-CM

## 2010-09-25 DIAGNOSIS — R110 Nausea: Secondary | ICD-10-CM

## 2010-09-25 DIAGNOSIS — R197 Diarrhea, unspecified: Secondary | ICD-10-CM

## 2010-09-25 DIAGNOSIS — R112 Nausea with vomiting, unspecified: Secondary | ICD-10-CM

## 2010-09-25 DIAGNOSIS — J988 Other specified respiratory disorders: Secondary | ICD-10-CM | POA: Insufficient documentation

## 2010-09-25 DIAGNOSIS — J3489 Other specified disorders of nose and nasal sinuses: Secondary | ICD-10-CM

## 2010-09-25 HISTORY — DX: Nasal congestion: R09.81

## 2010-09-25 MED ORDER — ONDANSETRON HCL 4 MG PO TABS: 4.0000 mg | ORAL_TABLET | Freq: Three times a day (TID) | ORAL | Status: AC | PRN

## 2010-09-25 MED ORDER — ONDANSETRON 4 MG PO TBDP
4.0000 mg | ORAL_TABLET | Freq: Once | ORAL | Status: AC
Start: 2010-09-25 — End: 2010-09-25
  Administered 2010-09-25: 4 mg via ORAL

## 2010-09-25 NOTE — Patient Instructions (Addendum)
Sounds like viral gastroenteritis, sinus congestion contributing to not feeling well. Treat with zofran for nausea. May try nasal saline. If sinus symptoms not better by Wednesday or any worsening, call us for antibiotic course. If any worsening, call us sooner. Push fluids, bland foods as well while feeling better. I don't expect fever >101 to return.

## 2010-09-25 NOTE — Telephone Encounter (Signed)
Could come in at 2:45 today, or tomorrow.  If wants we can send in zofran for nausea in meantime.

## 2010-09-25 NOTE — Assessment & Plan Note (Signed)
Going on 1 wk. Doesn't seem to currently have infection. Hold on abx treatment (don't want to make diarrhea/stomach upset worse). Update [place] if not better with sinus sxs in 2-3 days for likely augmentin.

## 2010-09-25 NOTE — Progress Notes (Signed)
  Subjective:    Patient ID: [name], male    DOB: [birth year], 77 y.o.   MRN: [id number]  HPI CC: I'm sick  77 yo with h/o NICM and chronic sCHF with EF 35%, aflutter, COPD, LBBB, GERD presents with:  3d h/o feeling ill.  Started with leg weakness and nausea.  Last night started feeling worse - fever to 101.  + HA across top of forehead (new). Last night nausea/vomiting.  Emesis yellow, NBNB.  Diarrhea also last night, no blood.  + sore body throughout.  No vomiting today.  Also with sinus congestion going on for last week.  Dark mucous when coughing.  Clear out of nose.  Minimal cough.  No ear pain, tooth pain, rashes.  No CP/tightness, SOB.  Drinking plenty of fluid to stay hydrated.  No sick contacts at home.  No recent new foods.  No changes in medicines recently.  Medications and allergies reviewed and updated in chart.  PMHx reviewed for relevance  Review of Systems Per HPI    Objective:   Physical Exam  Nursing note and vitals reviewed. Constitutional: He appears well-developed and well-nourished. No distress.       Somewhat nauseated in exam room  HENT:  Head: Normocephalic and atraumatic.  Mouth/Throat: Oropharynx is clear and moist. No oropharyngeal exudate.  Eyes: Conjunctivae and EOM are normal. Pupils are equal, round, and reactive to light. No scleral icterus.  Neck: Normal range of motion. Neck supple.  Cardiovascular: Normal rate, regular rhythm, normal heart sounds and intact distal pulses.   No murmur heard. Pulmonary/Chest: Effort normal and breath sounds normal. No respiratory distress. He has no wheezes. He has no rales.  Abdominal: Soft. Bowel sounds are normal. He exhibits no distension. There is no tenderness. There is no rebound and no guarding.  Lymphadenopathy:    He has no cervical adenopathy.  Skin: Skin is warm and dry. No rash noted.  Psychiatric: He has a normal mood and affect.          Assessment & Plan:

## 2010-09-25 NOTE — Telephone Encounter (Signed)
Spoke with patient. He will be here at 2:45 today. I cancelled the Zofran Rx.

## 2010-09-25 NOTE — Telephone Encounter (Signed)
Pt states he had vomiting and diarrhea last night, fever of 101.  He doesn't have the vomiting or diarrhea now but still has nausea, not sure about any fever because he is taking tylenol.  He has a headache, never has headaches.  He wants to be seen.  Advised pt he probably has a virus that will have to run it's course.  Advised clear fluids, no dairy products, no solid foods if he has nausea. Advised plenty of rest.  He still want to be seen.  Please advise.

## 2010-09-25 NOTE — Assessment & Plan Note (Signed)
Anticipate viral gastroenteritis.  zofran for nausea. Update if not imporving with time, reviewed anticipated recuperation

## 2010-10-04 ENCOUNTER — Ambulatory Visit (INDEPENDENT_AMBULATORY_CARE_PROVIDER_SITE_OTHER): Payer: Medicare Other | Admitting: Emergency Medicine

## 2010-10-04 DIAGNOSIS — I4892 Unspecified atrial flutter: Secondary | ICD-10-CM

## 2010-10-04 DIAGNOSIS — Z7901 Long term (current) use of anticoagulants: Secondary | ICD-10-CM

## 2010-10-04 LAB — POCT INR: INR: 2.3

## 2010-10-31 ENCOUNTER — Encounter: Payer: Medicare Other | Admitting: *Deleted

## 2010-10-31 ENCOUNTER — Encounter: Payer: Medicare Other | Admitting: Internal Medicine

## 2010-11-01 ENCOUNTER — Encounter: Payer: Medicare Other | Admitting: Emergency Medicine

## 2010-11-01 ENCOUNTER — Ambulatory Visit (INDEPENDENT_AMBULATORY_CARE_PROVIDER_SITE_OTHER): Payer: Medicare Other | Admitting: Emergency Medicine

## 2010-11-01 DIAGNOSIS — Z7901 Long term (current) use of anticoagulants: Secondary | ICD-10-CM

## 2010-11-01 DIAGNOSIS — I4892 Unspecified atrial flutter: Secondary | ICD-10-CM

## 2010-11-01 LAB — POCT INR: INR: 2.4

## 2010-11-29 ENCOUNTER — Ambulatory Visit (INDEPENDENT_AMBULATORY_CARE_PROVIDER_SITE_OTHER): Payer: Medicare Other | Admitting: Emergency Medicine

## 2010-11-29 DIAGNOSIS — I4892 Unspecified atrial flutter: Secondary | ICD-10-CM

## 2010-11-29 DIAGNOSIS — Z7901 Long term (current) use of anticoagulants: Secondary | ICD-10-CM

## 2010-11-29 LAB — POCT INR: INR: 2.2

## 2010-12-27 ENCOUNTER — Ambulatory Visit (INDEPENDENT_AMBULATORY_CARE_PROVIDER_SITE_OTHER): Payer: Medicare Other | Admitting: Emergency Medicine

## 2010-12-27 DIAGNOSIS — Z7901 Long term (current) use of anticoagulants: Secondary | ICD-10-CM

## 2010-12-27 DIAGNOSIS — I4892 Unspecified atrial flutter: Secondary | ICD-10-CM

## 2010-12-27 LAB — POCT INR: INR: 3.3

## 2011-01-09 ENCOUNTER — Ambulatory Visit (INDEPENDENT_AMBULATORY_CARE_PROVIDER_SITE_OTHER): Payer: Medicare Other | Admitting: Internal Medicine

## 2011-01-09 ENCOUNTER — Encounter: Payer: Self-pay | Admitting: Internal Medicine

## 2011-01-09 DIAGNOSIS — I429 Cardiomyopathy, unspecified: Secondary | ICD-10-CM

## 2011-01-09 DIAGNOSIS — I5022 Chronic systolic (congestive) heart failure: Secondary | ICD-10-CM

## 2011-01-09 DIAGNOSIS — I509 Heart failure, unspecified: Secondary | ICD-10-CM

## 2011-01-09 DIAGNOSIS — Z95810 Presence of automatic (implantable) cardiac defibrillator: Secondary | ICD-10-CM

## 2011-01-09 DIAGNOSIS — I4892 Unspecified atrial flutter: Secondary | ICD-10-CM

## 2011-01-09 DIAGNOSIS — I428 Other cardiomyopathies: Secondary | ICD-10-CM

## 2011-01-09 LAB — ICD DEVICE OBSERVATION
AL AMPLITUDE: 5.8 mv
AL IMPEDENCE ICD: 475 Ohm
AL THRESHOLD: 0.5 V
ATRIAL PACING ICD: 1.03 pct
BAMS-0001: 170 {beats}/min
BATTERY VOLTAGE: 3.1764 V
CHARGE TIME: 2.572 s
FVT: 0
LV LEAD IMPEDENCE ICD: 494 Ohm
LV LEAD THRESHOLD: 0.625 V
PACEART VT: 0
RV LEAD AMPLITUDE: 16.6 mv
RV LEAD IMPEDENCE ICD: 513 Ohm
RV LEAD THRESHOLD: 0.875 V
TOT-0001: 1
TOT-0002: 0
TOT-0006: 20120416000000
TZAT-0001ATACH: 1
TZAT-0001ATACH: 2
TZAT-0001ATACH: 3
TZAT-0001FASTVT: 1
TZAT-0001SLOWVT: 1
TZAT-0001SLOWVT: 2
TZAT-0002ATACH: NEGATIVE
TZAT-0002ATACH: NEGATIVE
TZAT-0002ATACH: NEGATIVE
TZAT-0002FASTVT: NEGATIVE
TZAT-0004SLOWVT: 6
TZAT-0004SLOWVT: 8
TZAT-0005SLOWVT: 84 pct
TZAT-0005SLOWVT: 91 pct
TZAT-0011SLOWVT: 10 ms
TZAT-0011SLOWVT: 10 ms
TZAT-0012ATACH: 150 ms
TZAT-0012ATACH: 150 ms
TZAT-0012ATACH: 150 ms
TZAT-0012FASTVT: 200 ms
TZAT-0012SLOWVT: 200 ms
TZAT-0012SLOWVT: 200 ms
TZAT-0013SLOWVT: 2
TZAT-0013SLOWVT: 2
TZAT-0018ATACH: NEGATIVE
TZAT-0018ATACH: NEGATIVE
TZAT-0018ATACH: NEGATIVE
TZAT-0018FASTVT: NEGATIVE
TZAT-0018SLOWVT: NEGATIVE
TZAT-0018SLOWVT: NEGATIVE
TZAT-0019ATACH: 6 V
TZAT-0019ATACH: 6 V
TZAT-0019ATACH: 6 V
TZAT-0019FASTVT: 8 V
TZAT-0019SLOWVT: 8 V
TZAT-0019SLOWVT: 8 V
TZAT-0020ATACH: 1.5 ms
TZAT-0020ATACH: 1.5 ms
TZAT-0020ATACH: 1.5 ms
TZAT-0020FASTVT: 1.5 ms
TZAT-0020SLOWVT: 1.5 ms
TZAT-0020SLOWVT: 1.5 ms
TZON-0003ATACH: 350 ms
TZON-0003SLOWVT: 340 ms
TZON-0003VSLOWVT: 450 ms
TZON-0004SLOWVT: 24
TZON-0004VSLOWVT: 20
TZON-0005SLOWVT: 12
TZST-0001ATACH: 4
TZST-0001ATACH: 5
TZST-0001ATACH: 6
TZST-0001FASTVT: 2
TZST-0001FASTVT: 3
TZST-0001FASTVT: 4
TZST-0001FASTVT: 5
TZST-0001FASTVT: 6
TZST-0001SLOWVT: 3
TZST-0001SLOWVT: 4
TZST-0001SLOWVT: 5
TZST-0001SLOWVT: 6
TZST-0002ATACH: NEGATIVE
TZST-0002ATACH: NEGATIVE
TZST-0002ATACH: NEGATIVE
TZST-0002FASTVT: NEGATIVE
TZST-0002FASTVT: NEGATIVE
TZST-0002FASTVT: NEGATIVE
TZST-0002FASTVT: NEGATIVE
TZST-0002FASTVT: NEGATIVE
TZST-0003SLOWVT: 15 J
TZST-0003SLOWVT: 28 J
TZST-0003SLOWVT: 35 J
TZST-0003SLOWVT: 35 J
VENTRICULAR PACING ICD: 87.47 pct
VF: 0

## 2011-01-09 MED ORDER — DOFETILIDE 500 MCG PO CAPS: 500.0000 ug | ORAL_CAPSULE | Freq: Two times a day (BID) | ORAL | Status: DC

## 2011-01-09 NOTE — Assessment & Plan Note (Signed)
His ventricular rate appears to be well controlled and he is biventricular pacing. He'll undergo a period of Watchful waiting.

## 2011-01-09 NOTE — Patient Instructions (Addendum)
Your physician recommends that you schedule a follow-up appointment in: 3-4 months with Dr Ladona Ridgel in [facility]   Remote monitoring is used to monitor your Pacemaker of ICD from home. This monitoring reduces the number of office visits required to check your device to one time per year. It allows [place] to keep an eye on the functioning of your device to ensure it is working properly. You are scheduled for a device check from home on 04/12/2011. You may send your transmission at any time that day. If you have a wireless device, the transmission will be sent automatically. After your physician reviews your transmission, you will receive a postcard with your next transmission date   Your physician has recommended you make the following change in your medication: 1) Increase Furosemide take 20mg  for 3 days then go back to his regular dose

## 2011-01-09 NOTE — Progress Notes (Signed)
HPI [name] returns today for followup. He has a DCM, Chronic systolic heart failure, and is s/p ICD. He also has atrial fibrillation. He was placed on Tikosyn and returned to NSR. He has subsequently gone back to atrial flutter. He feels well. He works hard in his garden. No c/p, sob, or peripheral edema. Allergies  Allergen Reactions  . Clarithromycin     REACTION: NAUSEA  . Famotidine     REACTION: ABD. PAIN  . Omeprazole     REACTION: UNSPECIFIED  . Omeprazole-Sodium Bicarbonate     REACTION: diarrhea  . Oxytetracycline     REACTION: BUMPS  . Penicillins     REACTION: SWELLING Amoxicillin ok  . Ranitidine     REACTION: diarrhea     Current Outpatient Prescriptions  Medication Sig Dispense Refill  . azelastine (ASTELIN) 137 MCG/SPRAY nasal spray 1 spray by Nasal route as needed. Use in each nostril as directed       . betamethasone valerate (VALISONE) 0.1 % cream Apply topically 2 (two) times daily.  45 g  0  . carvedilol (COREG) 6.25 MG tablet Take 6.25 mg by mouth 2 (two) times daily with a meal.        . diazepam (VALIUM) 5 MG tablet 1/4 tab at bedtime as needed       . docusate sodium (COLACE) 100 MG capsule Take 100 mg by mouth as needed.        . dofetilide (TIKOSYN) 500 MCG capsule Take 500 mcg by mouth 2 (two) times daily.        . enalapril (VASOTEC) 5 MG tablet Take 1 tablet (5 mg total) by mouth 2 (two) times daily.  180 tablet  3  . flunisolide (NASAREL) 29 MCG/ACT (0.025%) nasal spray 2 sprays by Nasal route daily. Dose is for each nostril.       . Fluticasone-Salmeterol (ADVAIR DISKUS) 100-50 MCG/DOSE AEPB Inhale 1 puff into the lungs every 12 (twelve) hours.        . furosemide (LASIX) 10 MG/ML solution Take by mouth daily.        Marland Kitchen guaifenesin (HUMIBID E) 400 MG TABS Take 400 mg by mouth as needed.        . Loratadine 10 MG CAPS Take 1 capsule by mouth daily.        . montelukast (SINGULAIR) 10 MG tablet Take 10 mg by mouth daily as needed.        .  sucralfate (CARAFATE) 1 G tablet Take 0.5 tablets (0.5 g total) by mouth as needed.  30 tablet  6  . warfarin (COUMADIN) 5 MG tablet Take by mouth as directed.           Past Medical History  Diagnosis Date  . NICM (nonischemic cardiomyopathy)     Cardiac catheterization March 2006 without coronary disease; echocardiogram August 2008: EF 35%, mild AI, left atrial enlargement  . Atrial flutter     A.  Status post cardioversion; B.  Tikosyn therapy  . COPD (chronic obstructive pulmonary disease)   . LBBB (left bundle branch block)   . GERD (gastroesophageal reflux disease)     h/o esophageal stricture with dilation  . Porphyria   . IBS (irritable bowel syndrome)   . BPH (benign prostatic hyperplasia)   . Diverticulitis   . Allergic rhinitis   . Hyperlipidemia   . Chronic systolic congestive heart failure   . History of GI bleed     Secondary to hemorrhoids  . Flatulence,  eructation, and gas pain     ROS:   All systems reviewed and negative except as noted in the HPI.   Past Surgical History  Procedure Date  . Penile prosthesis implant 2007    Otelin  . Cholecystectomy   . Goiter removal   . Nose surgery 1971  . Hernia repair   . Cardiac catheterization 06/22/04    Severe, nonischemia cardiomyopathy,EF 25-30%  . Biv-aicd implant     Medtronic  . Colonoscopy 10/99    Divertic, splenic, hepatic fleure only  . Esophagogastroduodenoscopy 01/1998    stricture, sliding HH, GERD  . Esophageal dilation 02/18/98  . Esophagogastroduodenoscopy 04/08/01    stricture, gastritis, HH, GERD-no dilation(Dr. Marina Goodell)  . Hernia repair 01/30/02    Dr. Daphine Deutscher  . Pulmonary eval 04/2002    ([facility]) Chronic congestive symptoms  . US echocardiography 07/13/04    EF 25-30%, Mod LVH; LA severe dilation; Mild AR; IRTR  . Esophagogastroduodenoscopy 12/22/04    stricture; gastritis; duodenitis, GERD  . US echocardiography 11/27/06    hypokinesis posterior wall, EF 35%; mild AR  .  Esophagogastroduodenoscopy 10/21/08    Reflux; Erythem. Duod. (Dr. Mechele Collin)  . Colonoscopy 10/21/08    aborted-divertics, int hemms (Dr. Mechele Collin)  . Cardioversion 02/22/09    AFlutter-([facility])     Family History  Problem Relation Age of Onset  . Coronary artery disease Father   . Hypertension Father   . Dysphagia Sister   . Breast cancer    . Ovarian cancer    . Uterine cancer    . Heart failure Mother   . Other Sister     stomach problems  . Other Sister     stomach problems  . Other Sister     stomach problems  . Alcohol abuse Maternal Uncle      History   Social History  . Marital Status: Married    Spouse Name: N/A    Number of Children: 3  . Years of Education: N/A   Occupational History  . retired   .     Social History Main Topics  . Smoking status: Never Smoker   . Smokeless tobacco: Never Used  . Alcohol Use: No  . Drug Use: No  . Sexually Active: Not on file   Other Topics Concern  . Not on file   Social History Narrative   Still works on a farm.No regular exerciseLives with wife3 children-8 grandchildrenRetired from [employer] tobaccoHad greenhouseGood friend with Dr. Gavin Potters     BP 120/76  Pulse 80  Ht 5\' 7"  (1.702 m)  Wt 147 lb 6.4 oz (66.86 kg)  BMI 23.09 kg/m2  Physical Exam:  Well appearing NAD HEENT: Unremarkable Neck:  No JVD, no thyromegally Lymphatics:  No adenopathy Back:  No CVA tenderness Lungs:  Clear no wheezes, rales, or rhonchi. HEART:  Regular rate rhythm, no murmurs, no rubs, no clicks Abd:  soft, positive bowel sounds, no organomegally, no rebound, no guarding Ext:  2 plus pulses, no edema, no cyanosis, no clubbing Skin:  No rashes no nodules Neuro:  CN II through XII intact, motor grossly intact   DEVICE  Normal device function.  See PaceArt for details. Atrial flutter. Optivol elevated.  Assess/Plan:

## 2011-01-09 NOTE — Assessment & Plan Note (Signed)
His symptoms are class I. His alcohol is elevated. We'll increase his Lasix for a few days. He will maintain a low-salt diet.

## 2011-01-24 ENCOUNTER — Ambulatory Visit (INDEPENDENT_AMBULATORY_CARE_PROVIDER_SITE_OTHER): Payer: Medicare Other | Admitting: Emergency Medicine

## 2011-01-24 DIAGNOSIS — I4892 Unspecified atrial flutter: Secondary | ICD-10-CM

## 2011-01-24 DIAGNOSIS — Z7901 Long term (current) use of anticoagulants: Secondary | ICD-10-CM

## 2011-01-24 LAB — POCT INR: INR: 2.6

## 2011-02-15 ENCOUNTER — Ambulatory Visit (INDEPENDENT_AMBULATORY_CARE_PROVIDER_SITE_OTHER): Payer: Medicare Other | Admitting: Cardiology

## 2011-02-15 ENCOUNTER — Encounter: Payer: Self-pay | Admitting: Cardiology

## 2011-02-15 DIAGNOSIS — I5022 Chronic systolic (congestive) heart failure: Secondary | ICD-10-CM

## 2011-02-15 DIAGNOSIS — I4892 Unspecified atrial flutter: Secondary | ICD-10-CM

## 2011-02-15 MED ORDER — CARVEDILOL 6.25 MG PO TABS: 6.2500 mg | ORAL_TABLET | Freq: Two times a day (BID) | ORAL | Status: DC

## 2011-02-15 NOTE — Assessment & Plan Note (Signed)
We had a long discussion about this.  He will come off of the Tikosyn as it is not controlling the rhythm.  He tolerates coumadin.  He understands that there does not seem to be an indication for ablation at this point.  I reviewed the ICD interrogation.

## 2011-02-15 NOTE — Progress Notes (Signed)
HPI [name] presents for follow up of his cardiomyopathy.  Since I last saw him he has done relatively well. He is back in atrial flutter. Dr. Ladona Ridgel told him to stop he Tikosyn but he was leery of doing this.  He doesn't feel any palpitations. He has a paced ventricular rate and rhythm. He does say he gets more tired than he used to but he still able to cut wood for his house. He still able to farms tomatoes. He's not describing any PND or orthopnea. He's had no weight gain or edema. He's had no chest pressure, neck or arm discomfort.  Allergies  Allergen Reactions  . Clarithromycin     REACTION: NAUSEA  . Famotidine     REACTION: ABD. PAIN  . Omeprazole     REACTION: UNSPECIFIED  . Omeprazole-Sodium Bicarbonate     REACTION: diarrhea  . Oxytetracycline     REACTION: BUMPS  . Penicillins     REACTION: SWELLING Amoxicillin ok  . Ranitidine     REACTION: diarrhea    Current Outpatient Prescriptions  Medication Sig Dispense Refill  . betamethasone valerate (VALISONE) 0.1 % cream Apply topically as needed.        . carvedilol (COREG) 6.25 MG tablet Take 6.25 mg by mouth 2 (two) times daily with a meal.        . diazepam (VALIUM) 5 MG tablet 1/4 tab at bedtime as needed       . docusate sodium (COLACE) 100 MG capsule Take 100 mg by mouth as needed.        . dofetilide (TIKOSYN) 500 MCG capsule Take 1 capsule (500 mcg total) by mouth 2 (two) times daily.  60 capsule  6  . enalapril (VASOTEC) 5 MG tablet Take 1 tablet (5 mg total) by mouth 2 (two) times daily.  180 tablet  3  . flunisolide (NASAREL) 29 MCG/ACT (0.025%) nasal spray 2 sprays by Nasal route daily. Dose is for each nostril.       . Fluticasone-Salmeterol (ADVAIR DISKUS) 100-50 MCG/DOSE AEPB Inhale 1 puff into the lungs as needed.       . furosemide (LASIX) 10 MG/ML solution Take by mouth daily.        Marland Kitchen guaifenesin (HUMIBID E) 400 MG TABS Take 400 mg by mouth as needed.        . Loratadine 10 MG CAPS Take 1  capsule by mouth as needed.       . montelukast (SINGULAIR) 10 MG tablet Take 10 mg by mouth daily as needed.        . sucralfate (CARAFATE) 1 G tablet Take 0.5 tablets (0.5 g total) by mouth as needed.  30 tablet  6  . warfarin (COUMADIN) 5 MG tablet Take by mouth as directed.          Past Medical History  Diagnosis Date  . NICM (nonischemic cardiomyopathy)     Cardiac catheterization March 2006 without coronary disease; echocardiogram August 2008: EF 35%, mild AI, left atrial enlargement  . Atrial flutter     A.  Status post cardioversion; B.  Tikosyn therapy  . COPD (chronic obstructive pulmonary disease)   . LBBB (left bundle branch block)   . GERD (gastroesophageal reflux disease)     h/o esophageal stricture with dilation  . Porphyria   . IBS (irritable bowel syndrome)   . BPH (benign prostatic hyperplasia)   . Diverticulitis   . Allergic rhinitis   . Hyperlipidemia   .  Chronic systolic congestive heart failure   . History of GI bleed     Secondary to hemorrhoids  . Flatulence, eructation, and gas pain     Past Surgical History  Procedure Date  . Penile prosthesis implant 2007    Otelin  . Cholecystectomy   . Goiter removal   . Nose surgery 1971  . Hernia repair   . Cardiac catheterization 06/22/04    Severe, nonischemia cardiomyopathy,EF 25-30%  . Biv-aicd implant     Medtronic  . Colonoscopy 10/99    Divertic, splenic, hepatic fleure only  . Esophagogastroduodenoscopy 01/1998    stricture, sliding HH, GERD  . Esophageal dilation 02/18/98  . Esophagogastroduodenoscopy 04/08/01    stricture, gastritis, HH, GERD-no dilation(Dr. Marina Goodell)  . Hernia repair 01/30/02    Dr. Daphine Deutscher  . Pulmonary eval 04/2002    ([facility]) Chronic congestive symptoms  . US echocardiography 07/13/04    EF 25-30%, Mod LVH; LA severe dilation; Mild AR; IRTR  . Esophagogastroduodenoscopy 12/22/04    stricture; gastritis; duodenitis, GERD  . US echocardiography 11/27/06    hypokinesis posterior  wall, EF 35%; mild AR  . Esophagogastroduodenoscopy 10/21/08    Reflux; Erythem. Duod. (Dr. Mechele Collin)  . Colonoscopy 10/21/08    aborted-divertics, int hemms (Dr. Mechele Collin)  . Cardioversion 02/22/09    AFlutter-([facility])    ROS:  As stated in the HPI and negative for all other systems.  PHYSICAL EXAM BP 145/84  Pulse 71  Ht 5\' 7"  (1.702 m)  Wt 145 lb (65.772 kg)  BMI 22.71 kg/m2 GENERAL:  Well appearing HEENT:  Pupils equal round and reactive, fundi not visualized, oral mucosa unremarkable NECK:  No jugular venous distention, waveform within normal limits, carotid upstroke brisk and symmetric, no bruits, no thyromegaly LYMPHATICS:  No cervical, inguinal adenopathy LUNGS:  Clear to auscultation bilaterally BACK:  No CVA tenderness CHEST:  Well healed ICD pocket HEART:  PMI not displaced or sustained,S1 and S2 within normal limits, no S3, no S4, no clicks, no rubs, no murmurs ABD:  Flat, positive bowel sounds normal in frequency in pitch, no bruits, no rebound, no guarding, no midline pulsatile mass, no hepatomegaly, no splenomegaly EXT:  2 plus pulses throughout, no edema, no cyanosis no clubbing SKIN:  No rashes no nodules NEURO:  Cranial nerves II through XII grossly intact, motor grossly intact throughout PSYCH:  Cognitively intact, oriented to person place and time   EKG:  Atrial flutter. Ventricular pacing 100% capture rate 67  ASSESSMENT AND PLAN

## 2011-02-15 NOTE — Assessment & Plan Note (Signed)
His Optivol is trending down and he has no symptoms.  I will not change his meds at this point.  He has been sensitive to med titration in the past.

## 2011-02-15 NOTE — Patient Instructions (Signed)
Please stop your Tikosyn Continue all other medications as listed  Follow up in 6 months with Dr Antoine Poche.  You will receive a letter in the mail 2 months before you are due.  Please call us when you receive this letter to schedule your follow up appointment.

## 2011-02-21 ENCOUNTER — Ambulatory Visit (INDEPENDENT_AMBULATORY_CARE_PROVIDER_SITE_OTHER): Payer: Medicare Other | Admitting: Emergency Medicine

## 2011-02-21 DIAGNOSIS — I4892 Unspecified atrial flutter: Secondary | ICD-10-CM

## 2011-02-21 DIAGNOSIS — Z7901 Long term (current) use of anticoagulants: Secondary | ICD-10-CM

## 2011-02-21 LAB — POCT INR: INR: 2.6

## 2011-02-22 ENCOUNTER — Encounter: Payer: Self-pay | Admitting: Family Medicine

## 2011-02-22 ENCOUNTER — Ambulatory Visit (INDEPENDENT_AMBULATORY_CARE_PROVIDER_SITE_OTHER): Payer: Medicare Other | Admitting: Family Medicine

## 2011-02-22 VITALS — BP 116/82 | HR 78 | Temp 97.8°F | Wt 147.2 lb

## 2011-02-22 DIAGNOSIS — I4892 Unspecified atrial flutter: Secondary | ICD-10-CM

## 2011-02-22 DIAGNOSIS — E538 Deficiency of other specified B group vitamins: Secondary | ICD-10-CM

## 2011-02-22 DIAGNOSIS — E785 Hyperlipidemia, unspecified: Secondary | ICD-10-CM

## 2011-02-22 DIAGNOSIS — I5022 Chronic systolic (congestive) heart failure: Secondary | ICD-10-CM

## 2011-02-22 DIAGNOSIS — I429 Cardiomyopathy, unspecified: Secondary | ICD-10-CM

## 2011-02-22 LAB — BASIC METABOLIC PANEL
BUN: 21 mg/dL (ref 6–23)
CO2: 29 mEq/L (ref 19–32)
Calcium: 9.2 mg/dL (ref 8.4–10.5)
Chloride: 105 mEq/L (ref 96–112)
Creatinine, Ser: 1.2 mg/dL (ref 0.4–1.5)
GFR: 64.18 mL/min (ref >60.00)
Glucose, Bld: 98 mg/dL (ref 70–99)
Potassium: 4.8 mEq/L (ref 3.5–5.1)
Sodium: 139 mEq/L (ref 135–145)

## 2011-02-22 LAB — LIPID PANEL
Cholesterol: 161 mg/dL (ref 0–200)
HDL: 43.8 mg/dL (ref >39.00)
LDL Cholesterol: 97 mg/dL (ref 0–99)
Total CHOL/HDL Ratio: 4
Triglycerides: 102 mg/dL (ref 0.0–149.0)
VLDL: 20.4 mg/dL (ref 0.0–40.0)

## 2011-02-22 LAB — CBC WITH DIFFERENTIAL/PLATELET
Basophils Absolute: 0 10*3/uL (ref 0.0–0.1)
Basophils Relative: 0.9 % (ref 0.0–3.0)
Eosinophils Absolute: 0.2 10*3/uL (ref 0.0–0.7)
Eosinophils Relative: 3.2 % (ref 0.0–5.0)
HCT: 41.3 % (ref 39.0–52.0)
Hemoglobin: 13.8 g/dL (ref 13.0–17.0)
Lymphocytes Relative: 31.7 % (ref 12.0–46.0)
Lymphs Abs: 1.7 10*3/uL (ref 0.7–4.0)
MCHC: 33.3 g/dL (ref 30.0–36.0)
MCV: 88.8 fl (ref 78.0–100.0)
Monocytes Absolute: 0.4 10*3/uL (ref 0.1–1.0)
Monocytes Relative: 8 % (ref 3.0–12.0)
Neutro Abs: 3.1 10*3/uL (ref 1.4–7.7)
Neutrophils Relative %: 56.2 % (ref 43.0–77.0)
Platelets: 106 10*3/uL — ABNORMAL LOW (ref 150.0–400.0)
RBC: 4.65 Mil/uL (ref 4.22–5.81)
RDW: 14.5 % (ref 11.5–14.6)
WBC: 5.4 10*3/uL (ref 4.5–10.5)

## 2011-02-22 MED ORDER — DIAZEPAM 5 MG PO TABS: ORAL_TABLET | ORAL | Status: DC

## 2011-02-22 NOTE — Patient Instructions (Signed)
We will check blood work today.  If B12 level staying low, we will call you to set up replacement schedule. Return as needed or in 6 months for physical.

## 2011-02-22 NOTE — Progress Notes (Signed)
  Subjective:    Patient ID: [name], male    DOB: [birth year], 77 y.o.   MRN: [id number]  HPI CC: 82mo f/u  77 y.o. with h/o nonischemic cardiomyopathy with an EF of 35%, chronic systolic congestive heart failure, atrial flutter failed Tikosyn therapy so stopped, chronic Coumadin therapy, status post Bi-V-ICD implantation, COPD, HLD, GERD.  Presents for 6 mo f/u.  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  No concerns today.  No activity limiting sxs.  No chest pain, tightness, DOE, SOB, heart fluttering, dizziness, HA, vision changes. No abd pain, nausea/vomiting, blood in stool or urine.  New pacer/defibrillator placed in April 2012 by Dr. Ladona Ridgel.  Reviewed blood work, found to have vit b12 def 2010, denies fatigue.    Lab Results  Component Value Date   CHOL 191 02/09/2010   HDL 42.50 02/09/2010   LDLCALC 119* 02/09/2010   TRIG 148.0 02/09/2010   CHOLHDL 4 02/09/2010   Review of Systems Per HPI    Objective:   Physical Exam  Nursing note and vitals reviewed. Constitutional: He is oriented to person, place, and time. He appears well-developed and well-nourished. No distress.  HENT:  Head: Normocephalic and atraumatic.  Right Ear: External ear normal.  Left Ear: External ear normal.  Nose: Nose normal.  Mouth/Throat: Oropharynx is clear and moist. No oropharyngeal exudate.  Eyes: Conjunctivae and EOM are normal. Pupils are equal, round, and reactive to light. No scleral icterus.  Neck: Normal range of motion. Neck supple.  Cardiovascular: Normal rate, regular rhythm, normal heart sounds and intact distal pulses.   No murmur heard. Pulses:      Radial pulses are 2+ on the right side, and 2+ on the left side.  Pulmonary/Chest: Effort normal and breath sounds normal. No respiratory distress. He has no wheezes. He has no rales.  Abdominal: Soft. Bowel sounds are normal. He exhibits no distension and no mass. There is no tenderness. There is no  rebound and no guarding.  Musculoskeletal: Normal range of motion.  Lymphadenopathy:    He has no cervical adenopathy.  Neurological: He is alert and oriented to person, place, and time.       CN grossly intact, station and gait intact  Skin: Skin is warm and dry. No rash noted.  Psychiatric: He has a normal mood and affect. His behavior is normal. Judgment and thought content normal.       Assessment & Plan:

## 2011-02-22 NOTE — Assessment & Plan Note (Signed)
Regular rhythm, not tachycardic on exam today.

## 2011-02-22 NOTE — Assessment & Plan Note (Signed)
Chronic stable. No edema, no sxs.  continue meds.

## 2011-02-22 NOTE — Assessment & Plan Note (Signed)
recheck cbc and b12 today, if remaining low, start supplementation.  Discussed with pt

## 2011-02-22 NOTE — Assessment & Plan Note (Signed)
Recheck FLP today as fasting. 

## 2011-02-23 LAB — VITAMIN B12: Vitamin B-12: 211 pg/mL (ref 211–911)

## 2011-02-23 LAB — TSH: TSH: 1.65 u[IU]/mL (ref 0.35–5.50)

## 2011-02-25 ENCOUNTER — Encounter: Payer: Self-pay | Admitting: Family Medicine

## 2011-02-25 ENCOUNTER — Telehealth: Payer: Self-pay | Admitting: Family Medicine

## 2011-02-25 DIAGNOSIS — D696 Thrombocytopenia, unspecified: Secondary | ICD-10-CM | POA: Insufficient documentation

## 2011-02-25 NOTE — Telephone Encounter (Signed)
Please notify vitamin B12 level low. I'd like him to take b12 shots one a month for 12 months, then start daily oral B12 supplement.  Please set this up. Kidneys, sugar, thyroid, cholesterol levels normal.   Blood count normal except platelets staying low.  Have been low in past.  Would like him to return after taking b12 shots for 3 months for recheck blood count.

## 2011-02-26 ENCOUNTER — Other Ambulatory Visit: Payer: Self-pay | Admitting: *Deleted

## 2011-02-26 MED ORDER — WARFARIN SODIUM 5 MG PO TABS: 5.0000 mg | ORAL_TABLET | ORAL | Status: DC

## 2011-02-26 NOTE — Telephone Encounter (Signed)
Message left with patients wife to return my call.

## 2011-02-27 ENCOUNTER — Encounter: Payer: Self-pay | Admitting: Family Medicine

## 2011-02-27 NOTE — Telephone Encounter (Signed)
Patient notified and B-12 injection scheduled as well as lab appointment.

## 2011-03-09 ENCOUNTER — Ambulatory Visit: Payer: Medicare Other

## 2011-03-13 ENCOUNTER — Ambulatory Visit (INDEPENDENT_AMBULATORY_CARE_PROVIDER_SITE_OTHER): Payer: Medicare Other | Admitting: *Deleted

## 2011-03-13 DIAGNOSIS — E538 Deficiency of other specified B group vitamins: Secondary | ICD-10-CM

## 2011-03-13 MED ORDER — CYANOCOBALAMIN 1000 MCG/ML IJ SOLN
1000.0000 ug | Freq: Once | INTRAMUSCULAR | Status: AC
  Administered 2011-03-13: 1000 ug via INTRAMUSCULAR

## 2011-03-16 ENCOUNTER — Encounter: Payer: Self-pay | Admitting: Family Medicine

## 2011-03-21 ENCOUNTER — Ambulatory Visit (INDEPENDENT_AMBULATORY_CARE_PROVIDER_SITE_OTHER): Payer: Medicare Other | Admitting: Emergency Medicine

## 2011-03-21 DIAGNOSIS — I4892 Unspecified atrial flutter: Secondary | ICD-10-CM

## 2011-03-21 DIAGNOSIS — Z7901 Long term (current) use of anticoagulants: Secondary | ICD-10-CM

## 2011-03-21 LAB — POCT INR: INR: 2.2

## 2011-04-12 ENCOUNTER — Other Ambulatory Visit: Payer: Self-pay | Admitting: Internal Medicine

## 2011-04-12 ENCOUNTER — Ambulatory Visit (INDEPENDENT_AMBULATORY_CARE_PROVIDER_SITE_OTHER): Payer: Medicare Other | Admitting: *Deleted

## 2011-04-12 ENCOUNTER — Encounter: Payer: Self-pay | Admitting: Internal Medicine

## 2011-04-12 DIAGNOSIS — I429 Cardiomyopathy, unspecified: Secondary | ICD-10-CM

## 2011-04-12 DIAGNOSIS — I5022 Chronic systolic (congestive) heart failure: Secondary | ICD-10-CM | POA: Diagnosis not present

## 2011-04-14 LAB — REMOTE ICD DEVICE
AL AMPLITUDE: 4 mv
AL IMPEDENCE ICD: 437 Ohm
AL THRESHOLD: 0.5 V
ATRIAL PACING ICD: 0 pct
BAMS-0001: 170 {beats}/min
BATTERY VOLTAGE: 3.1628 V
CHARGE TIME: 8.578 s
FVT: 0
LV LEAD IMPEDENCE ICD: 494 Ohm
LV LEAD THRESHOLD: 0.625 V
PACEART VT: 0
RV LEAD AMPLITUDE: 19.4 mv
RV LEAD IMPEDENCE ICD: 551 Ohm
RV LEAD THRESHOLD: 1 V
TOT-0001: 1
TOT-0002: 0
TOT-0006: 20120416000000
TZAT-0001ATACH: 1
TZAT-0001ATACH: 2
TZAT-0001ATACH: 3
TZAT-0001FASTVT: 1
TZAT-0001SLOWVT: 1
TZAT-0001SLOWVT: 2
TZAT-0002ATACH: NEGATIVE
TZAT-0002ATACH: NEGATIVE
TZAT-0002ATACH: NEGATIVE
TZAT-0002FASTVT: NEGATIVE
TZAT-0004SLOWVT: 6
TZAT-0004SLOWVT: 8
TZAT-0005SLOWVT: 84 pct
TZAT-0005SLOWVT: 91 pct
TZAT-0011SLOWVT: 10 ms
TZAT-0011SLOWVT: 10 ms
TZAT-0012ATACH: 150 ms
TZAT-0012ATACH: 150 ms
TZAT-0012ATACH: 150 ms
TZAT-0012FASTVT: 200 ms
TZAT-0012SLOWVT: 200 ms
TZAT-0012SLOWVT: 200 ms
TZAT-0013SLOWVT: 2
TZAT-0013SLOWVT: 2
TZAT-0018ATACH: NEGATIVE
TZAT-0018ATACH: NEGATIVE
TZAT-0018ATACH: NEGATIVE
TZAT-0018FASTVT: NEGATIVE
TZAT-0018SLOWVT: NEGATIVE
TZAT-0018SLOWVT: NEGATIVE
TZAT-0019ATACH: 6 V
TZAT-0019ATACH: 6 V
TZAT-0019ATACH: 6 V
TZAT-0019FASTVT: 8 V
TZAT-0019SLOWVT: 8 V
TZAT-0019SLOWVT: 8 V
TZAT-0020ATACH: 1.5 ms
TZAT-0020ATACH: 1.5 ms
TZAT-0020ATACH: 1.5 ms
TZAT-0020FASTVT: 1.5 ms
TZAT-0020SLOWVT: 1.5 ms
TZAT-0020SLOWVT: 1.5 ms
TZON-0003ATACH: 350 ms
TZON-0003SLOWVT: 340 ms
TZON-0003VSLOWVT: 450 ms
TZON-0004SLOWVT: 24
TZON-0004VSLOWVT: 20
TZON-0005SLOWVT: 12
TZST-0001ATACH: 4
TZST-0001ATACH: 5
TZST-0001ATACH: 6
TZST-0001FASTVT: 2
TZST-0001FASTVT: 3
TZST-0001FASTVT: 4
TZST-0001FASTVT: 5
TZST-0001FASTVT: 6
TZST-0001SLOWVT: 3
TZST-0001SLOWVT: 4
TZST-0001SLOWVT: 5
TZST-0001SLOWVT: 6
TZST-0002ATACH: NEGATIVE
TZST-0002ATACH: NEGATIVE
TZST-0002ATACH: NEGATIVE
TZST-0002FASTVT: NEGATIVE
TZST-0002FASTVT: NEGATIVE
TZST-0002FASTVT: NEGATIVE
TZST-0002FASTVT: NEGATIVE
TZST-0002FASTVT: NEGATIVE
TZST-0003SLOWVT: 15 J
TZST-0003SLOWVT: 28 J
TZST-0003SLOWVT: 35 J
TZST-0003SLOWVT: 35 J
VENTRICULAR PACING ICD: 72.29 pct
VF: 0

## 2011-04-17 ENCOUNTER — Ambulatory Visit: Payer: Medicare Other

## 2011-04-17 ENCOUNTER — Ambulatory Visit (INDEPENDENT_AMBULATORY_CARE_PROVIDER_SITE_OTHER): Payer: Medicare Other | Admitting: *Deleted

## 2011-04-17 DIAGNOSIS — E538 Deficiency of other specified B group vitamins: Secondary | ICD-10-CM

## 2011-04-17 MED ORDER — CYANOCOBALAMIN 1000 MCG/ML IJ SOLN
1000.0000 ug | Freq: Once | INTRAMUSCULAR | Status: AC
  Administered 2011-04-17: 1000 ug via INTRAMUSCULAR

## 2011-04-18 ENCOUNTER — Ambulatory Visit (INDEPENDENT_AMBULATORY_CARE_PROVIDER_SITE_OTHER): Payer: Medicare Other | Admitting: Emergency Medicine

## 2011-04-18 DIAGNOSIS — I4892 Unspecified atrial flutter: Secondary | ICD-10-CM

## 2011-04-18 DIAGNOSIS — Z7901 Long term (current) use of anticoagulants: Secondary | ICD-10-CM

## 2011-04-18 LAB — POCT INR: INR: 2.4

## 2011-04-19 NOTE — Progress Notes (Signed)
Remote icd check w/icm  

## 2011-04-25 ENCOUNTER — Encounter: Payer: Self-pay | Admitting: *Deleted

## 2011-05-02 DIAGNOSIS — L570 Actinic keratosis: Secondary | ICD-10-CM | POA: Diagnosis not present

## 2011-05-02 DIAGNOSIS — L989 Disorder of the skin and subcutaneous tissue, unspecified: Secondary | ICD-10-CM | POA: Diagnosis not present

## 2011-05-16 DIAGNOSIS — H11159 Pinguecula, unspecified eye: Secondary | ICD-10-CM | POA: Diagnosis not present

## 2011-05-16 DIAGNOSIS — H2510 Age-related nuclear cataract, unspecified eye: Secondary | ICD-10-CM | POA: Diagnosis not present

## 2011-05-17 ENCOUNTER — Other Ambulatory Visit: Payer: Self-pay | Admitting: Cardiovascular Disease

## 2011-05-17 MED ORDER — WARFARIN SODIUM 5 MG PO TABS: ORAL_TABLET | ORAL | Status: DC

## 2011-05-18 ENCOUNTER — Ambulatory Visit: Payer: Medicare Other

## 2011-05-18 ENCOUNTER — Other Ambulatory Visit: Payer: Self-pay

## 2011-05-18 MED ORDER — WARFARIN SODIUM 5 MG PO TABS: ORAL_TABLET | ORAL | Status: DC

## 2011-05-23 ENCOUNTER — Other Ambulatory Visit: Payer: Self-pay | Admitting: Family Medicine

## 2011-05-23 DIAGNOSIS — E538 Deficiency of other specified B group vitamins: Secondary | ICD-10-CM

## 2011-05-30 ENCOUNTER — Ambulatory Visit (INDEPENDENT_AMBULATORY_CARE_PROVIDER_SITE_OTHER): Payer: Medicare Other

## 2011-05-30 ENCOUNTER — Telehealth: Payer: Self-pay | Admitting: *Deleted

## 2011-05-30 DIAGNOSIS — I4892 Unspecified atrial flutter: Secondary | ICD-10-CM | POA: Diagnosis not present

## 2011-05-30 DIAGNOSIS — Z7901 Long term (current) use of anticoagulants: Secondary | ICD-10-CM | POA: Diagnosis not present

## 2011-05-30 LAB — POCT INR: INR: 1.8

## 2011-05-30 MED ORDER — VITAMIN B-12 500 MCG PO TABS
500.0000 ug | ORAL_TABLET | Freq: Every day | ORAL | Status: DC
Start: 2011-05-30 — End: 2011-08-14

## 2011-05-30 NOTE — Telephone Encounter (Signed)
Patient called asking if he needed to keep lab appt for Friday because he stopped his B12 injections. He stopped them on his own because he said they were causing constipation issues. I advised that he should've let us know that he was having issues instead of just stopping his meds because that was not a common side effect of B12. I advised he still needed the labs to see if the injections that he did take made any difference.

## 2011-05-30 NOTE — Telephone Encounter (Signed)
He received 2 injections. Will keep lab appt.

## 2011-05-30 NOTE — Telephone Encounter (Signed)
How many total shots received? If more than 1-2 then ok to come in for labs.  O/w not necessary.  I do recommend starting oral supplement for B12 at daily (OTC).  Placed on med list.

## 2011-06-01 ENCOUNTER — Other Ambulatory Visit: Payer: Medicare Other

## 2011-06-08 ENCOUNTER — Other Ambulatory Visit (INDEPENDENT_AMBULATORY_CARE_PROVIDER_SITE_OTHER): Payer: Medicare Other

## 2011-06-08 DIAGNOSIS — D696 Thrombocytopenia, unspecified: Secondary | ICD-10-CM

## 2011-06-08 DIAGNOSIS — E538 Deficiency of other specified B group vitamins: Secondary | ICD-10-CM

## 2011-06-08 LAB — CBC WITH DIFFERENTIAL/PLATELET
Basophils Absolute: 0.1 10*3/uL (ref 0.0–0.1)
Basophils Relative: 1.8 % (ref 0.0–3.0)
Eosinophils Absolute: 0.2 10*3/uL (ref 0.0–0.7)
Eosinophils Relative: 4.9 % (ref 0.0–5.0)
HCT: 41.4 % (ref 39.0–52.0)
Hemoglobin: 13.9 g/dL (ref 13.0–17.0)
Lymphocytes Relative: 30.5 % (ref 12.0–46.0)
Lymphs Abs: 1.4 10*3/uL (ref 0.7–4.0)
MCHC: 33.7 g/dL (ref 30.0–36.0)
MCV: 87.8 fl (ref 78.0–100.0)
Monocytes Absolute: 0.4 10*3/uL (ref 0.1–1.0)
Monocytes Relative: 8.1 % (ref 3.0–12.0)
Neutro Abs: 2.5 10*3/uL (ref 1.4–7.7)
Neutrophils Relative %: 54.7 % (ref 43.0–77.0)
Platelets: 103 10*3/uL — ABNORMAL LOW (ref 150.0–400.0)
RBC: 4.71 Mil/uL (ref 4.22–5.81)
RDW: 15.6 % — ABNORMAL HIGH (ref 11.5–14.6)
WBC: 4.5 10*3/uL (ref 4.5–10.5)

## 2011-06-08 LAB — VITAMIN B12: Vitamin B-12: 297 pg/mL (ref 211–911)

## 2011-06-11 ENCOUNTER — Encounter: Payer: Self-pay | Admitting: Family Medicine

## 2011-06-11 ENCOUNTER — Ambulatory Visit (INDEPENDENT_AMBULATORY_CARE_PROVIDER_SITE_OTHER): Payer: Medicare Other | Admitting: Family Medicine

## 2011-06-11 DIAGNOSIS — M5432 Sciatica, left side: Secondary | ICD-10-CM | POA: Insufficient documentation

## 2011-06-11 DIAGNOSIS — M5430 Sciatica, unspecified side: Secondary | ICD-10-CM | POA: Diagnosis not present

## 2011-06-11 DIAGNOSIS — E538 Deficiency of other specified B group vitamins: Secondary | ICD-10-CM

## 2011-06-11 NOTE — Assessment & Plan Note (Signed)
rec start B12 supplement oral - shot made him constipated.

## 2011-06-11 NOTE — Patient Instructions (Signed)
You have sciatica on left side.  Treat with tyelnol scheduled twice daily for 1 week then as needed. Do stretching exercises provided today. If not better, let me know for referral to physical therapy.

## 2011-06-11 NOTE — Progress Notes (Signed)
Subjective:    Patient ID: [name], male    DOB: October 27, 1931, 78 y.o.   MRN: [id number]  HPI CC: L hip pain  78 y.o. with h/o nonischemic cardiomyopathy with an EF of 35%, chronic systolic congestive heart failure, atrial flutter failed Tikosyn therapy so stopped, chronic Coumadin therapy, status post Bi-V-ICD implantation, COPD, HLD, GERD.   Several week h/o L hip pain.  Trouble lifting leg 2/2 pain.  Even trouble with going up steps.  When supine no pain.  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  + shooting pain down front and back of leg.  Pain starts in buttock.  No lower back pain.  Trouble getting in car even.  Has been using tylenol arthritis.  No fevers/chills, night sweats, leg numbness.  No leg weakness.  No bowel/bladder incontinence.  Stopped B12 because made him constipated.  Wt Readings from Last 3 Encounters:  06/11/11 147 lb 12 oz (67.019 kg)  02/22/11 147 lb 4 oz (66.792 kg)  02/15/11 145 lb (65.772 kg)   1 fall in last year, no injury  Past Medical History  Diagnosis Date  . NICM (nonischemic cardiomyopathy)     Cardiac catheterization March 2006 without coronary disease; echocardiogram August 2008: EF 35%, mild AI, left atrial enlargement  . Atrial flutter     A.  Status post cardioversion; B.  Tikosyn therapy - failed, remains in aflutte  . COPD (chronic obstructive pulmonary disease)   . LBBB (left bundle branch block)   . GERD (gastroesophageal reflux disease)     h/o esophageal stricture with dilation  . Porphyria   . IBS (irritable bowel syndrome)   . BPH (benign prostatic hyperplasia)   . Diverticulitis   . Allergic rhinitis     [name]  . Hyperlipidemia   . Chronic systolic congestive heart failure   . History of GI bleed     Secondary to hemorrhoids  . Extrinsic asthma     [place] [facility]   Past Surgical History  Procedure Date  . Penile prosthesis implant 2007    Otelin  . Cholecystectomy   .  Goiter removal   . Nose surgery 1971  . Hernia repair   . Cardiac catheterization 06/22/04    Severe, nonischemia cardiomyopathy,EF 25-30%  . Biv-aicd implant     Medtronic  . Colonoscopy 10/99    Divertic, splenic, hepatic fleure only  . Esophagogastroduodenoscopy 01/1998    stricture, sliding HH, GERD  . Esophageal dilation 02/18/98  . Esophagogastroduodenoscopy 04/08/01    stricture, gastritis, HH, GERD-no dilation(Dr. Marina Goodell)  . Hernia repair 01/30/02    Dr. Daphine Deutscher  . Pulmonary eval 04/2002    ([facility]) Chronic congestive symptoms  . US echocardiography 07/13/04    EF 25-30%, Mod LVH; LA severe dilation; Mild AR; IRTR  . Esophagogastroduodenoscopy 12/22/04    stricture; gastritis; duodenitis, GERD  . US echocardiography 11/27/06    hypokinesis posterior wall, EF 35%; mild AR  . Esophagogastroduodenoscopy 10/21/08    Reflux; Erythem. Duod. (Dr. Mechele Collin)  . Colonoscopy 10/21/08    aborted-divertics, int hemms (Dr. Mechele Collin)  . Cardioversion 02/22/09    AFlutter-([facility])  . Coronary angioplasty     Review of Systems Per HPI    Objective:   Physical Exam  Nursing note and vitals reviewed. Constitutional: He is oriented to person, place, and time. He appears well-developed and well-nourished. No distress.  Musculoskeletal: Normal range of motion. He exhibits no edema.  No midline spine pain or paraspinous mm tenderness. Neg SLR bilaterally. No pain with int/ext rotation at hip. No pain with palpation of GTB or SIJ. Neg FABER ++ tender with palpation at left sciatic notch.  Neurological: He is alert and oriented to person, place, and time. He has normal reflexes.  Skin: Skin is warm and dry.  Psychiatric: He has a normal mood and affect.       Assessment & Plan:

## 2011-06-11 NOTE — Assessment & Plan Note (Signed)
No red flags. Use tylenol and stretching exercises provided from [facility] pt advisor on piriformis syndrome. If not better, consider steroid and PT referral.

## 2011-06-12 ENCOUNTER — Other Ambulatory Visit: Payer: Self-pay | Admitting: Cardiology

## 2011-06-12 ENCOUNTER — Other Ambulatory Visit: Payer: Self-pay | Admitting: *Deleted

## 2011-06-27 ENCOUNTER — Ambulatory Visit (INDEPENDENT_AMBULATORY_CARE_PROVIDER_SITE_OTHER): Payer: Medicare Other

## 2011-06-27 DIAGNOSIS — Z7901 Long term (current) use of anticoagulants: Secondary | ICD-10-CM | POA: Diagnosis not present

## 2011-06-27 DIAGNOSIS — I4892 Unspecified atrial flutter: Secondary | ICD-10-CM

## 2011-06-27 LAB — POCT INR: INR: 2.4

## 2011-07-11 ENCOUNTER — Telehealth: Payer: Self-pay

## 2011-07-11 DIAGNOSIS — M5432 Sciatica, left side: Secondary | ICD-10-CM

## 2011-07-11 MED ORDER — PREDNISONE 20 MG PO TABS: ORAL_TABLET | ORAL | Status: DC

## 2011-07-11 NOTE — Telephone Encounter (Signed)
Pt left v/m sciatic nerve no better. I called and pt was not available but spoke with Gigi Gin his wife and she said sciatic nerve pain in left hip area was not better. Pt has been doing exercises that Dr Reece Agar gave pt and taking Tylenol.  Pt uses [facility] and can be reached at [phone]. I explained call back would not be today and Gigi Gin said that was OK.

## 2011-07-11 NOTE — Telephone Encounter (Addendum)
I'd like to prescribe steroid taper - sent to pharmacy. If pt interested, may also refer to physical therapy (placed order in chart). If not improving or if worsening to return to see me.

## 2011-07-12 ENCOUNTER — Ambulatory Visit (INDEPENDENT_AMBULATORY_CARE_PROVIDER_SITE_OTHER): Payer: Medicare Other | Admitting: *Deleted

## 2011-07-12 ENCOUNTER — Encounter: Payer: Self-pay | Admitting: Internal Medicine

## 2011-07-12 DIAGNOSIS — I429 Cardiomyopathy, unspecified: Secondary | ICD-10-CM | POA: Diagnosis not present

## 2011-07-12 DIAGNOSIS — I5022 Chronic systolic (congestive) heart failure: Secondary | ICD-10-CM | POA: Diagnosis not present

## 2011-07-12 LAB — REMOTE ICD DEVICE
AL AMPLITUDE: 4 mv
AL IMPEDENCE ICD: 437 Ohm
AL THRESHOLD: 0.5 V
ATRIAL PACING ICD: 0 pct
BAMS-0001: 170 {beats}/min
BATTERY VOLTAGE: 3.1436 V
CHARGE TIME: 8.578 s
FVT: 0
LV LEAD IMPEDENCE ICD: 418 Ohm
LV LEAD THRESHOLD: 0.75 V
PACEART VT: 0
RV LEAD AMPLITUDE: 18.25 mv
RV LEAD IMPEDENCE ICD: 551 Ohm
RV LEAD THRESHOLD: 0.75 V
TOT-0001: 1
TOT-0002: 0
TOT-0006: 20120416000000
TZAT-0001ATACH: 1
TZAT-0001ATACH: 2
TZAT-0001ATACH: 3
TZAT-0001FASTVT: 1
TZAT-0001SLOWVT: 1
TZAT-0001SLOWVT: 2
TZAT-0002ATACH: NEGATIVE
TZAT-0002ATACH: NEGATIVE
TZAT-0002ATACH: NEGATIVE
TZAT-0002FASTVT: NEGATIVE
TZAT-0004SLOWVT: 6
TZAT-0004SLOWVT: 8
TZAT-0005SLOWVT: 84 pct
TZAT-0005SLOWVT: 91 pct
TZAT-0011SLOWVT: 10 ms
TZAT-0011SLOWVT: 10 ms
TZAT-0012ATACH: 150 ms
TZAT-0012ATACH: 150 ms
TZAT-0012ATACH: 150 ms
TZAT-0012FASTVT: 200 ms
TZAT-0012SLOWVT: 200 ms
TZAT-0012SLOWVT: 200 ms
TZAT-0013SLOWVT: 2
TZAT-0013SLOWVT: 2
TZAT-0018ATACH: NEGATIVE
TZAT-0018ATACH: NEGATIVE
TZAT-0018ATACH: NEGATIVE
TZAT-0018FASTVT: NEGATIVE
TZAT-0018SLOWVT: NEGATIVE
TZAT-0018SLOWVT: NEGATIVE
TZAT-0019ATACH: 6 V
TZAT-0019ATACH: 6 V
TZAT-0019ATACH: 6 V
TZAT-0019FASTVT: 8 V
TZAT-0019SLOWVT: 8 V
TZAT-0019SLOWVT: 8 V
TZAT-0020ATACH: 1.5 ms
TZAT-0020ATACH: 1.5 ms
TZAT-0020ATACH: 1.5 ms
TZAT-0020FASTVT: 1.5 ms
TZAT-0020SLOWVT: 1.5 ms
TZAT-0020SLOWVT: 1.5 ms
TZON-0003ATACH: 350 ms
TZON-0003SLOWVT: 340 ms
TZON-0003VSLOWVT: 450 ms
TZON-0004SLOWVT: 24
TZON-0004VSLOWVT: 20
TZON-0005SLOWVT: 12
TZST-0001ATACH: 4
TZST-0001ATACH: 5
TZST-0001ATACH: 6
TZST-0001FASTVT: 2
TZST-0001FASTVT: 3
TZST-0001FASTVT: 4
TZST-0001FASTVT: 5
TZST-0001FASTVT: 6
TZST-0001SLOWVT: 3
TZST-0001SLOWVT: 4
TZST-0001SLOWVT: 5
TZST-0001SLOWVT: 6
TZST-0002ATACH: NEGATIVE
TZST-0002ATACH: NEGATIVE
TZST-0002ATACH: NEGATIVE
TZST-0002FASTVT: NEGATIVE
TZST-0002FASTVT: NEGATIVE
TZST-0002FASTVT: NEGATIVE
TZST-0002FASTVT: NEGATIVE
TZST-0002FASTVT: NEGATIVE
TZST-0003SLOWVT: 15 J
TZST-0003SLOWVT: 28 J
TZST-0003SLOWVT: 35 J
TZST-0003SLOWVT: 35 J
VENTRICULAR PACING ICD: 65.4 pct
VF: 0

## 2011-07-12 NOTE — Telephone Encounter (Signed)
Patient notified. He was not interested in PT but said he would try the steroids. He will come back if no improvement or if worsening.

## 2011-07-18 ENCOUNTER — Encounter: Payer: Self-pay | Admitting: *Deleted

## 2011-07-25 ENCOUNTER — Ambulatory Visit (INDEPENDENT_AMBULATORY_CARE_PROVIDER_SITE_OTHER): Payer: Medicare Other

## 2011-07-25 DIAGNOSIS — Z7901 Long term (current) use of anticoagulants: Secondary | ICD-10-CM | POA: Diagnosis not present

## 2011-07-25 DIAGNOSIS — I4892 Unspecified atrial flutter: Secondary | ICD-10-CM

## 2011-07-25 LAB — POCT INR: INR: 2.6

## 2011-07-26 NOTE — Progress Notes (Signed)
Remote icd check w/icm  

## 2011-08-09 DIAGNOSIS — J309 Allergic rhinitis, unspecified: Secondary | ICD-10-CM | POA: Diagnosis not present

## 2011-08-09 DIAGNOSIS — H1045 Other chronic allergic conjunctivitis: Secondary | ICD-10-CM | POA: Diagnosis not present

## 2011-08-09 DIAGNOSIS — J45909 Unspecified asthma, uncomplicated: Secondary | ICD-10-CM | POA: Diagnosis not present

## 2011-08-09 DIAGNOSIS — J019 Acute sinusitis, unspecified: Secondary | ICD-10-CM | POA: Diagnosis not present

## 2011-08-13 ENCOUNTER — Other Ambulatory Visit: Payer: Self-pay | Admitting: Cardiology

## 2011-08-14 ENCOUNTER — Ambulatory Visit (INDEPENDENT_AMBULATORY_CARE_PROVIDER_SITE_OTHER): Payer: Medicare Other | Admitting: Cardiology

## 2011-08-14 ENCOUNTER — Encounter: Payer: Self-pay | Admitting: Cardiology

## 2011-08-14 ENCOUNTER — Ambulatory Visit (INDEPENDENT_AMBULATORY_CARE_PROVIDER_SITE_OTHER): Payer: Medicare Other | Admitting: Pharmacist

## 2011-08-14 VITALS — BP 125/80 | HR 68 | Ht 67.0 in | Wt 147.4 lb

## 2011-08-14 DIAGNOSIS — Z7901 Long term (current) use of anticoagulants: Secondary | ICD-10-CM | POA: Diagnosis not present

## 2011-08-14 DIAGNOSIS — I5022 Chronic systolic (congestive) heart failure: Secondary | ICD-10-CM | POA: Diagnosis not present

## 2011-08-14 DIAGNOSIS — I4892 Unspecified atrial flutter: Secondary | ICD-10-CM

## 2011-08-14 DIAGNOSIS — I429 Cardiomyopathy, unspecified: Secondary | ICD-10-CM | POA: Diagnosis not present

## 2011-08-14 LAB — POCT INR: INR: 3.4

## 2011-08-14 MED ORDER — ENALAPRIL MALEATE 5 MG PO TABS: 5.0000 mg | ORAL_TABLET | Freq: Two times a day (BID) | ORAL | Status: DC

## 2011-08-14 MED ORDER — FUROSEMIDE 10 MG/ML PO SOLN: 10.0000 mg | Freq: Every day | ORAL | Status: DC

## 2011-08-14 MED ORDER — CARVEDILOL 6.25 MG PO TABS: 6.2500 mg | ORAL_TABLET | Freq: Two times a day (BID) | ORAL | Status: DC

## 2011-08-14 MED ORDER — FUROSEMIDE 20 MG PO TABS: 10.0000 mg | ORAL_TABLET | Freq: Every day | ORAL | Status: DC

## 2011-08-14 NOTE — Progress Notes (Signed)
HPI [name] presents for follow up of his cardiomyopathy and atrial flutter.  He is in permanent atrial flutter and we discontinued his  Tikosyn at the last appointment. .Since I last saw him he has done relatively well.  His tomatoes or in the ground. 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 He's not describing any PND or orthopnea. He's had no weight gain or edema. He's had no chest pressure, neck or arm discomfort.  Allergies  Allergen Reactions  . Clarithromycin     REACTION: NAUSEA  . Famotidine     REACTION: ABD. PAIN  . Omeprazole     REACTION: UNSPECIFIED  . Omeprazole-Sodium Bicarbonate     REACTION: diarrhea  . Oxytetracycline     REACTION: BUMPS  . Penicillins     REACTION: SWELLING Amoxicillin ok  . Ranitidine     REACTION: diarrhea    Current Outpatient Prescriptions  Medication Sig Dispense Refill  . betamethasone valerate (VALISONE) 0.1 % cream Apply topically as needed.        . carvedilol (COREG) 6.25 MG tablet Take 1 tablet (6.25 mg total) by mouth 2 (two) times daily with a meal.  180 tablet  3  . diazepam (VALIUM) 5 MG tablet 1/4 tab at bedtime as needed  30 tablet  0  . docusate sodium (COLACE) 100 MG capsule Take 100 mg by mouth as needed.        . enalapril (VASOTEC) 5 MG tablet TAKE 1 TABLET (5MG  TOTAL) TWICE DAILY  180 tablet  PRN  . flunisolide (NASAREL) 29 MCG/ACT (0.025%) nasal spray 2 sprays by Nasal route daily. Dose is for each nostril.       . Fluticasone-Salmeterol (ADVAIR DISKUS) 100-50 MCG/DOSE AEPB Inhale 1 puff into the lungs as needed.       . furosemide (LASIX) 10 MG/ML solution TAKE 1 ML ONCE DAILY  30 mL  11  . guaifenesin (HUMIBID E) 400 MG TABS Take 400 mg by mouth as needed.        . Loratadine 10 MG CAPS Take 1 capsule by mouth as needed.       . montelukast (SINGULAIR) 10 MG tablet Take 10 mg by mouth daily as needed.        . sucralfate (CARAFATE) 1 G tablet Take 0.5 tablets (0.5 g total) by mouth as  needed.  30 tablet  6  . warfarin (COUMADIN) 5 MG tablet Take as directed by the anticoagulation clinic  135 tablet  1    Past Medical History  Diagnosis Date  . NICM (nonischemic cardiomyopathy)     Cardiac catheterization March 2006 without coronary disease; echocardiogram August 2008: EF 35%, mild AI, left atrial enlargement  . Atrial flutter     A.  Status post cardioversion; B.  Tikosyn therapy - failed, remains in aflutte  . COPD (chronic obstructive pulmonary disease)   . LBBB (left bundle branch block)   . GERD (gastroesophageal reflux disease)     h/o esophageal stricture with dilation  . Porphyria   . IBS (irritable bowel syndrome)   . BPH (benign prostatic hyperplasia)   . Diverticulitis   . Allergic rhinitis     [name]  . Hyperlipidemia   . Chronic systolic congestive heart failure   . History of GI bleed     Secondary to hemorrhoids  . Extrinsic asthma     [place] [facility]    Past Surgical History  Procedure Date  . Penile  prosthesis implant 2007    Otelin  . Cholecystectomy   . Goiter removal   . Nose surgery 1971  . Hernia repair   . Cardiac catheterization 06/22/04    Severe, nonischemia cardiomyopathy,EF 25-30%  . Biv-aicd implant     [employer]tronic  . Colonoscopy 10/99    Divertic, splenic, hepatic fleure only  . Esophagogastroduodenoscopy 01/1998    stricture, sliding HH, GERD  . Esophageal dilation 02/18/98  . Esophagogastroduodenoscopy 04/08/01    stricture, gastritis, HH, GERD-no dilation(Dr. Marina Goodell)  . Hernia repair 01/30/02    Dr. Daphine Deutscher  . Pulmonary eval 04/2002    ([facility]) Chronic congestive symptoms  . US echocardiography 07/13/04    EF 25-30%, Mod LVH; LA severe dilation; Mild AR; IRTR  . Esophagogastroduodenoscopy 12/22/04    stricture; gastritis; duodenitis, GERD  . US echocardiography 11/27/06    hypokinesis posterior wall, EF 35%; mild AR  . Esophagogastroduodenoscopy 10/21/08    Reflux; Erythem. Duod. (Dr. Mechele Collin)  . Colonoscopy 10/21/08     aborted-divertics, int hemms (Dr. Mechele Collin)  . Cardioversion 02/22/09    AFlutter-([facility])  . Coronary angioplasty   . Back surgery 1997    bulging disks    ROS:  As stated in the HPI and negative for all other systems.  PHYSICAL EXAM BP 125/80  Pulse 68  Ht 5\' 7"  (1.702 m)  Wt 147 lb 6.4 oz (66.86 kg)  BMI 23.09 kg/m2 GENERAL:  Well appearing HEENT:  Pupils equal round and reactive, fundi not visualized, oral mucosa unremarkable NECK:  No jugular venous distention, waveform within normal limits, carotid upstroke brisk and symmetric, no bruits, no thyromegaly LYMPHATICS:  No cervical, inguinal adenopathy LUNGS:  Clear to auscultation bilaterally BACK:  No CVA tenderness CHEST:  Well healed ICD pocket HEART:  PMI not displaced or sustained,S1 and S2 within normal limits, no S3, no S4, no clicks, no rubs, no murmurs ABD:  Flat, positive bowel sounds normal in frequency in pitch, no bruits, no rebound, no guarding, no midline pulsatile mass, no hepatomegaly, no splenomegaly EXT:  2 plus pulses throughout, no edema, no cyanosis no clubbing SKIN:  No rashes no nodules NEURO:  Cranial nerves II through XII grossly intact, motor grossly intact throughout PSYCH:  Cognitively intact, oriented to person place and time   EKG:  Atrial flutter. Ventricular pacing 100% capture rate 68.  No change from previous.  08/14/2011  ASSESSMENT AND PLAN

## 2011-08-14 NOTE — Patient Instructions (Signed)
The current medical regimen is effective;  continue present plan and medications.  Follow up in 6 months with Dr Hochrein.  You will receive a letter in the mail 2 months before you are due.  Please call us when you receive this letter to schedule your follow up appointment.  

## 2011-08-14 NOTE — Assessment & Plan Note (Signed)
The patient  tolerates this rhythm and rate control and anticoagulation. We will continue with the meds as listed.  He does not want to switch from warfarin.

## 2011-08-14 NOTE — Assessment & Plan Note (Signed)
He seems to be euvolemic.  At this point, no change in therapy is indicated.  We have reviewed salt and fluid restrictions.  No further cardiovascular testing is indicated.   

## 2011-08-16 DIAGNOSIS — M47817 Spondylosis without myelopathy or radiculopathy, lumbosacral region: Secondary | ICD-10-CM | POA: Diagnosis not present

## 2011-08-16 DIAGNOSIS — IMO0002 Reserved for concepts with insufficient information to code with codable children: Secondary | ICD-10-CM | POA: Diagnosis not present

## 2011-08-17 ENCOUNTER — Ambulatory Visit: Payer: Self-pay | Admitting: Physical Medicine and Rehabilitation

## 2011-08-17 DIAGNOSIS — M5137 Other intervertebral disc degeneration, lumbosacral region: Secondary | ICD-10-CM | POA: Diagnosis not present

## 2011-08-17 DIAGNOSIS — M545 Low back pain, unspecified: Secondary | ICD-10-CM | POA: Diagnosis not present

## 2011-08-28 DIAGNOSIS — IMO0002 Reserved for concepts with insufficient information to code with codable children: Secondary | ICD-10-CM | POA: Diagnosis not present

## 2011-08-28 DIAGNOSIS — M48062 Spinal stenosis, lumbar region with neurogenic claudication: Secondary | ICD-10-CM | POA: Diagnosis not present

## 2011-08-28 DIAGNOSIS — M47817 Spondylosis without myelopathy or radiculopathy, lumbosacral region: Secondary | ICD-10-CM | POA: Diagnosis not present

## 2011-09-05 ENCOUNTER — Ambulatory Visit (INDEPENDENT_AMBULATORY_CARE_PROVIDER_SITE_OTHER): Payer: Medicare Other

## 2011-09-05 DIAGNOSIS — Z7901 Long term (current) use of anticoagulants: Secondary | ICD-10-CM

## 2011-09-05 DIAGNOSIS — I4892 Unspecified atrial flutter: Secondary | ICD-10-CM

## 2011-09-05 LAB — POCT INR: INR: 4.6

## 2011-09-07 ENCOUNTER — Other Ambulatory Visit: Payer: Self-pay | Admitting: Family Medicine

## 2011-09-19 DIAGNOSIS — R141 Gas pain: Secondary | ICD-10-CM | POA: Diagnosis not present

## 2011-09-19 DIAGNOSIS — R1013 Epigastric pain: Secondary | ICD-10-CM | POA: Diagnosis not present

## 2011-09-19 DIAGNOSIS — R142 Eructation: Secondary | ICD-10-CM | POA: Diagnosis not present

## 2011-09-19 DIAGNOSIS — K5900 Constipation, unspecified: Secondary | ICD-10-CM | POA: Diagnosis not present

## 2011-09-26 ENCOUNTER — Ambulatory Visit (INDEPENDENT_AMBULATORY_CARE_PROVIDER_SITE_OTHER): Payer: Medicare Other

## 2011-09-26 DIAGNOSIS — I4892 Unspecified atrial flutter: Secondary | ICD-10-CM | POA: Diagnosis not present

## 2011-09-26 DIAGNOSIS — Z7901 Long term (current) use of anticoagulants: Secondary | ICD-10-CM | POA: Diagnosis not present

## 2011-09-26 LAB — POCT INR: INR: 2.2

## 2011-09-28 DIAGNOSIS — K5900 Constipation, unspecified: Secondary | ICD-10-CM | POA: Diagnosis not present

## 2011-10-08 DIAGNOSIS — I728 Aneurysm of other specified arteries: Secondary | ICD-10-CM

## 2011-10-08 HISTORY — DX: Aneurysm of other specified arteries: I72.8

## 2011-10-12 ENCOUNTER — Encounter: Payer: Self-pay | Admitting: Internal Medicine

## 2011-10-12 ENCOUNTER — Ambulatory Visit (INDEPENDENT_AMBULATORY_CARE_PROVIDER_SITE_OTHER): Payer: Medicare Other | Admitting: *Deleted

## 2011-10-12 DIAGNOSIS — Z95810 Presence of automatic (implantable) cardiac defibrillator: Secondary | ICD-10-CM | POA: Diagnosis not present

## 2011-10-12 DIAGNOSIS — I5022 Chronic systolic (congestive) heart failure: Secondary | ICD-10-CM

## 2011-10-12 LAB — REMOTE ICD DEVICE
AL AMPLITUDE: 7.375 mv
AL IMPEDENCE ICD: 475 Ohm
ATRIAL PACING ICD: 0 pct
BAMS-0001: 170 {beats}/min
BATTERY VOLTAGE: 3.1287 V
CHARGE TIME: 8.968 s
FVT: 0
LV LEAD IMPEDENCE ICD: 437 Ohm
LV LEAD THRESHOLD: 0.625 V
PACEART VT: 0
RV LEAD AMPLITUDE: 18.5 mv
RV LEAD IMPEDENCE ICD: 570 Ohm
RV LEAD THRESHOLD: 1 V
TOT-0001: 1
TOT-0002: 0
TOT-0006: 20120416000000
TZAT-0001ATACH: 1
TZAT-0001ATACH: 2
TZAT-0001ATACH: 3
TZAT-0001FASTVT: 1
TZAT-0001SLOWVT: 1
TZAT-0001SLOWVT: 2
TZAT-0002ATACH: NEGATIVE
TZAT-0002ATACH: NEGATIVE
TZAT-0002ATACH: NEGATIVE
TZAT-0002FASTVT: NEGATIVE
TZAT-0004SLOWVT: 6
TZAT-0004SLOWVT: 8
TZAT-0005SLOWVT: 84 pct
TZAT-0005SLOWVT: 91 pct
TZAT-0011SLOWVT: 10 ms
TZAT-0011SLOWVT: 10 ms
TZAT-0012ATACH: 150 ms
TZAT-0012ATACH: 150 ms
TZAT-0012ATACH: 150 ms
TZAT-0012FASTVT: 200 ms
TZAT-0012SLOWVT: 200 ms
TZAT-0012SLOWVT: 200 ms
TZAT-0013SLOWVT: 2
TZAT-0013SLOWVT: 2
TZAT-0018ATACH: NEGATIVE
TZAT-0018ATACH: NEGATIVE
TZAT-0018ATACH: NEGATIVE
TZAT-0018FASTVT: NEGATIVE
TZAT-0018SLOWVT: NEGATIVE
TZAT-0018SLOWVT: NEGATIVE
TZAT-0019ATACH: 6 V
TZAT-0019ATACH: 6 V
TZAT-0019ATACH: 6 V
TZAT-0019FASTVT: 8 V
TZAT-0019SLOWVT: 8 V
TZAT-0019SLOWVT: 8 V
TZAT-0020ATACH: 1.5 ms
TZAT-0020ATACH: 1.5 ms
TZAT-0020ATACH: 1.5 ms
TZAT-0020FASTVT: 1.5 ms
TZAT-0020SLOWVT: 1.5 ms
TZAT-0020SLOWVT: 1.5 ms
TZON-0003ATACH: 350 ms
TZON-0003SLOWVT: 340 ms
TZON-0003VSLOWVT: 450 ms
TZON-0004SLOWVT: 24
TZON-0004VSLOWVT: 20
TZON-0005SLOWVT: 12
TZST-0001ATACH: 4
TZST-0001ATACH: 5
TZST-0001ATACH: 6
TZST-0001FASTVT: 2
TZST-0001FASTVT: 3
TZST-0001FASTVT: 4
TZST-0001FASTVT: 5
TZST-0001FASTVT: 6
TZST-0001SLOWVT: 3
TZST-0001SLOWVT: 4
TZST-0001SLOWVT: 5
TZST-0001SLOWVT: 6
TZST-0002ATACH: NEGATIVE
TZST-0002ATACH: NEGATIVE
TZST-0002ATACH: NEGATIVE
TZST-0002FASTVT: NEGATIVE
TZST-0002FASTVT: NEGATIVE
TZST-0002FASTVT: NEGATIVE
TZST-0002FASTVT: NEGATIVE
TZST-0002FASTVT: NEGATIVE
TZST-0003SLOWVT: 15 J
TZST-0003SLOWVT: 28 J
TZST-0003SLOWVT: 35 J
TZST-0003SLOWVT: 35 J
VENTRICULAR PACING ICD: 70.06 pct
VF: 0

## 2011-10-15 DIAGNOSIS — R634 Abnormal weight loss: Secondary | ICD-10-CM | POA: Diagnosis not present

## 2011-10-15 DIAGNOSIS — R1013 Epigastric pain: Secondary | ICD-10-CM | POA: Diagnosis not present

## 2011-10-15 DIAGNOSIS — R110 Nausea: Secondary | ICD-10-CM | POA: Diagnosis not present

## 2011-10-15 DIAGNOSIS — R142 Eructation: Secondary | ICD-10-CM | POA: Diagnosis not present

## 2011-10-15 DIAGNOSIS — R141 Gas pain: Secondary | ICD-10-CM | POA: Diagnosis not present

## 2011-10-17 ENCOUNTER — Ambulatory Visit: Payer: Self-pay | Admitting: Unknown Physician Specialty

## 2011-10-17 DIAGNOSIS — R110 Nausea: Secondary | ICD-10-CM | POA: Diagnosis not present

## 2011-10-17 DIAGNOSIS — R634 Abnormal weight loss: Secondary | ICD-10-CM | POA: Diagnosis not present

## 2011-10-17 DIAGNOSIS — K449 Diaphragmatic hernia without obstruction or gangrene: Secondary | ICD-10-CM | POA: Diagnosis not present

## 2011-10-17 DIAGNOSIS — R1013 Epigastric pain: Secondary | ICD-10-CM | POA: Diagnosis not present

## 2011-10-21 ENCOUNTER — Encounter: Payer: Self-pay | Admitting: Family Medicine

## 2011-10-24 ENCOUNTER — Ambulatory Visit (INDEPENDENT_AMBULATORY_CARE_PROVIDER_SITE_OTHER): Payer: Medicare Other

## 2011-10-24 DIAGNOSIS — Z7901 Long term (current) use of anticoagulants: Secondary | ICD-10-CM

## 2011-10-24 DIAGNOSIS — I4892 Unspecified atrial flutter: Secondary | ICD-10-CM | POA: Diagnosis not present

## 2011-10-24 LAB — POCT INR: INR: 2.7

## 2011-10-26 DIAGNOSIS — I509 Heart failure, unspecified: Secondary | ICD-10-CM | POA: Diagnosis not present

## 2011-10-26 DIAGNOSIS — I499 Cardiac arrhythmia, unspecified: Secondary | ICD-10-CM | POA: Diagnosis not present

## 2011-10-26 DIAGNOSIS — I10 Essential (primary) hypertension: Secondary | ICD-10-CM | POA: Diagnosis not present

## 2011-11-02 ENCOUNTER — Encounter: Payer: Self-pay | Admitting: *Deleted

## 2011-11-06 ENCOUNTER — Encounter: Payer: Self-pay | Admitting: Family Medicine

## 2011-11-20 DIAGNOSIS — K589 Irritable bowel syndrome without diarrhea: Secondary | ICD-10-CM | POA: Diagnosis not present

## 2011-11-20 DIAGNOSIS — R1013 Epigastric pain: Secondary | ICD-10-CM | POA: Diagnosis not present

## 2011-11-21 ENCOUNTER — Ambulatory Visit (INDEPENDENT_AMBULATORY_CARE_PROVIDER_SITE_OTHER): Payer: Medicare Other

## 2011-11-21 DIAGNOSIS — I4892 Unspecified atrial flutter: Secondary | ICD-10-CM | POA: Diagnosis not present

## 2011-11-21 DIAGNOSIS — Z7901 Long term (current) use of anticoagulants: Secondary | ICD-10-CM | POA: Diagnosis not present

## 2011-11-21 LAB — POCT INR: INR: 2.6

## 2012-01-02 ENCOUNTER — Ambulatory Visit (INDEPENDENT_AMBULATORY_CARE_PROVIDER_SITE_OTHER): Payer: Medicare Other

## 2012-01-02 DIAGNOSIS — Z7901 Long term (current) use of anticoagulants: Secondary | ICD-10-CM

## 2012-01-02 DIAGNOSIS — I4892 Unspecified atrial flutter: Secondary | ICD-10-CM | POA: Diagnosis not present

## 2012-01-02 LAB — POCT INR: INR: 2.4

## 2012-01-15 ENCOUNTER — Encounter: Payer: Self-pay | Admitting: *Deleted

## 2012-01-22 ENCOUNTER — Ambulatory Visit (INDEPENDENT_AMBULATORY_CARE_PROVIDER_SITE_OTHER): Payer: Medicare Other | Admitting: Internal Medicine

## 2012-01-22 ENCOUNTER — Encounter: Payer: Self-pay | Admitting: Internal Medicine

## 2012-01-22 VITALS — BP 102/68 | HR 71 | Ht 66.0 in | Wt 143.0 lb

## 2012-01-22 DIAGNOSIS — I4892 Unspecified atrial flutter: Secondary | ICD-10-CM

## 2012-01-22 DIAGNOSIS — I429 Cardiomyopathy, unspecified: Secondary | ICD-10-CM

## 2012-01-22 DIAGNOSIS — I5022 Chronic systolic (congestive) heart failure: Secondary | ICD-10-CM

## 2012-01-22 DIAGNOSIS — Z95810 Presence of automatic (implantable) cardiac defibrillator: Secondary | ICD-10-CM

## 2012-01-22 LAB — ICD DEVICE OBSERVATION
AL AMPLITUDE: 7.5 mv
AL IMPEDENCE ICD: 475 Ohm
AL THRESHOLD: 0.5 V
ATRIAL PACING ICD: 0 pct
BAMS-0001: 170 {beats}/min
BATTERY VOLTAGE: 3.1074 V
CHARGE TIME: 8.968 s
FVT: 0
LV LEAD IMPEDENCE ICD: 437 Ohm
LV LEAD THRESHOLD: 0.75 V
PACEART VT: 0
RV LEAD AMPLITUDE: 15.5 mv
RV LEAD IMPEDENCE ICD: 513 Ohm
RV LEAD THRESHOLD: 0.875 V
TOT-0001: 1
TOT-0002: 0
TOT-0006: 20120416000000
TZAT-0001ATACH: 1
TZAT-0001ATACH: 2
TZAT-0001ATACH: 3
TZAT-0001FASTVT: 1
TZAT-0001SLOWVT: 1
TZAT-0001SLOWVT: 2
TZAT-0002ATACH: NEGATIVE
TZAT-0002ATACH: NEGATIVE
TZAT-0002ATACH: NEGATIVE
TZAT-0002FASTVT: NEGATIVE
TZAT-0004SLOWVT: 6
TZAT-0004SLOWVT: 8
TZAT-0005SLOWVT: 84 pct
TZAT-0005SLOWVT: 91 pct
TZAT-0011SLOWVT: 10 ms
TZAT-0011SLOWVT: 10 ms
TZAT-0012ATACH: 150 ms
TZAT-0012ATACH: 150 ms
TZAT-0012ATACH: 150 ms
TZAT-0012FASTVT: 200 ms
TZAT-0012SLOWVT: 200 ms
TZAT-0012SLOWVT: 200 ms
TZAT-0013SLOWVT: 2
TZAT-0013SLOWVT: 2
TZAT-0018ATACH: NEGATIVE
TZAT-0018ATACH: NEGATIVE
TZAT-0018ATACH: NEGATIVE
TZAT-0018FASTVT: NEGATIVE
TZAT-0018SLOWVT: NEGATIVE
TZAT-0018SLOWVT: NEGATIVE
TZAT-0019ATACH: 6 V
TZAT-0019ATACH: 6 V
TZAT-0019ATACH: 6 V
TZAT-0019FASTVT: 8 V
TZAT-0019SLOWVT: 8 V
TZAT-0019SLOWVT: 8 V
TZAT-0020ATACH: 1.5 ms
TZAT-0020ATACH: 1.5 ms
TZAT-0020ATACH: 1.5 ms
TZAT-0020FASTVT: 1.5 ms
TZAT-0020SLOWVT: 1.5 ms
TZAT-0020SLOWVT: 1.5 ms
TZON-0003ATACH: 350 ms
TZON-0003SLOWVT: 340 ms
TZON-0003VSLOWVT: 450 ms
TZON-0004SLOWVT: 32
TZON-0004VSLOWVT: 20
TZON-0005SLOWVT: 12
TZST-0001ATACH: 4
TZST-0001ATACH: 5
TZST-0001ATACH: 6
TZST-0001FASTVT: 2
TZST-0001FASTVT: 3
TZST-0001FASTVT: 4
TZST-0001FASTVT: 5
TZST-0001FASTVT: 6
TZST-0001SLOWVT: 3
TZST-0001SLOWVT: 4
TZST-0001SLOWVT: 5
TZST-0001SLOWVT: 6
TZST-0002ATACH: NEGATIVE
TZST-0002ATACH: NEGATIVE
TZST-0002ATACH: NEGATIVE
TZST-0002FASTVT: NEGATIVE
TZST-0002FASTVT: NEGATIVE
TZST-0002FASTVT: NEGATIVE
TZST-0002FASTVT: NEGATIVE
TZST-0002FASTVT: NEGATIVE
TZST-0003SLOWVT: 15 J
TZST-0003SLOWVT: 28 J
TZST-0003SLOWVT: 35 J
TZST-0003SLOWVT: 35 J
VENTRICULAR PACING ICD: 68.74 pct
VF: 0

## 2012-01-22 NOTE — Assessment & Plan Note (Signed)
He has both fibrillation and left atrial flutter. His ventricular rate appears to be well-controlled.

## 2012-01-22 NOTE — Assessment & Plan Note (Signed)
His symptoms are currently class II. He will continue his current medical therapy, and maintain a low-sodium diet. 

## 2012-01-22 NOTE — Patient Instructions (Signed)
Your physician wants you to follow-up in: 12 months with Dr Taylor You will receive a reminder letter in the mail two months in advance. If you don't receive a letter, please call our office to schedule the follow-up appointment.    Remote monitoring is used to monitor your Pacemaker of ICD from home. This monitoring reduces the number of office visits required to check your device to one time per year. It allows us to keep an eye on the functioning of your device to ensure it is working properly. You are scheduled for a device check from home on 1/20-14. You may send your transmission at any time that day. If you have a wireless device, the transmission will be sent automatically. After your physician reviews your transmission, you will receive a postcard with your next transmission date.   

## 2012-01-22 NOTE — Assessment & Plan Note (Signed)
His Medtronic biventricular ICD is working normally. We'll plan to recheck in several months. 

## 2012-01-22 NOTE — Progress Notes (Signed)
HPI [name] returns today for followup. He is a very pleasant 78 year old man with a nonischemic cardiomyopathy, chronic systolic heart failure, chronic atrial fibrillation, status post biventricular ICD implantation secondary to all the above in the setting of left bundle branch block. The patient denies chest pain, shortness of breath, or peripheral edema. He remains active. No syncope. Allergies  Allergen Reactions  . Clarithromycin Nausea Only  . Famotidine Other (See Comments)    ABD. PAIN  . Omeprazole Diarrhea  . Oxytetracycline     REACTION: BUMPS  . Penicillins     REACTION: SWELLING Amoxicillin ok  . Proton Pump Inhibitors Other (See Comments)    GI upset, diarrhea, gas, bloating  . Ranitidine Diarrhea     Current Outpatient Prescriptions  Medication Sig Dispense Refill  . carvedilol (COREG) 6.25 MG tablet Take 1 tablet (6.25 mg total) by mouth 2 (two) times daily with a meal.  180 tablet  3  . diazepam (VALIUM) 5 MG tablet 1/4 tab at bedtime as needed  30 tablet  0  . docusate sodium (COLACE) 100 MG capsule Take 100 mg by mouth as needed.        . enalapril (VASOTEC) 5 MG tablet Take 1 tablet (5 mg total) by mouth 2 (two) times daily.  180 tablet  3  . flunisolide (NASAREL) 29 MCG/ACT (0.025%) nasal spray 2 sprays by Nasal route daily. Dose is for each nostril.       . Fluticasone-Salmeterol (ADVAIR DISKUS) 100-50 MCG/DOSE AEPB Inhale 1 puff into the lungs as needed.       . furosemide (LASIX) 20 MG tablet Take 10 mg by mouth 2 (two) times daily.      . Loratadine 10 MG CAPS Take 1 capsule by mouth as needed.       . montelukast (SINGULAIR) 10 MG tablet Take 10 mg by mouth daily as needed.        . sucralfate (CARAFATE) 1 G tablet TAKE 1/2 A TABLET BY MOUTH AS NEEDED  30 tablet  3  . warfarin (COUMADIN) 5 MG tablet Take as directed by the anticoagulation clinic  135 tablet  1     Past Medical History  Diagnosis Date  . NICM (nonischemic cardiomyopathy)    Cardiac catheterization March 2006 without coronary disease; echocardiogram August 2008: EF 35%, mild AI, left atrial enlargement  . Atrial flutter     A.  Status post cardioversion; B.  Tikosyn therapy - failed, remains in aflutte  . COPD (chronic obstructive pulmonary disease)   . LBBB (left bundle branch block)   . GERD (gastroesophageal reflux disease) 2010    h/o esophageal stricture with dilation, LA grade C reflux esophagitis by EGD 2010  . Porphyria   . IBS (irritable bowel syndrome)   . BPH (benign prostatic hyperplasia)   . Diverticulitis   . Allergic rhinitis     [name]  . Hyperlipidemia   . Chronic systolic congestive heart failure   . History of GI bleed     Secondary to hemorrhoids  . Extrinsic asthma     [name]  . 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 Wyn Quaker)    ROS:   All systems reviewed and negative except as noted in the HPI.   Past Surgical History  Procedure Date  . Penile prosthesis implant 2007    Otelin  . Cholecystectomy   . Goiter removal   . Nose surgery 1971  . Hernia repair   .  Cardiac catheterization 06/22/04    Severe, nonischemia cardiomyopathy,EF 25-30%  . Biv-aicd implant     [employer]tronic  . Colonoscopy 10/99    Divertic, splenic, hepatic fleure only  . Esophagogastroduodenoscopy 01/1998    stricture, sliding HH, GERD  . Esophageal dilation 02/18/98  . Esophagogastroduodenoscopy 04/08/01    stricture, gastritis, HH, GERD-no dilation(Dr. Marina Goodell)  . Hernia repair 01/30/02    Dr. Daphine Deutscher  . Pulmonary eval 04/2002    ([facility]) Chronic congestive symptoms  . US echocardiography 07/13/04    EF 25-30%, Mod LVH; LA severe dilation; Mild AR; IRTR  . Esophagogastroduodenoscopy 12/22/04    stricture; gastritis; duodenitis, GERD  . US echocardiography 11/27/06    hypokinesis posterior wall, EF 35%; mild AR  . Esophagogastroduodenoscopy 10/21/08    Reflux esophagitis; Erythem. Duod. (Dr. Mechele Collin)  . Colonoscopy 10/21/08     aborted-divertics, int hemms (Dr. Mechele Collin)  . Cardioversion 02/22/09    AFlutter-([facility])  . Coronary angioplasty   . Back surgery 1997    bulging disks     Family History  Problem Relation Age of Onset  . Coronary artery disease Father   . Hypertension Father   . Dysphagia Sister   . Breast cancer    . Ovarian cancer    . Uterine cancer    . Heart failure Mother   . Other Sister     stomach problems  . Other Sister     stomach problems  . Other Sister     stomach problems  . Alcohol abuse Maternal Uncle      History   Social History  . Marital Status: Married    Spouse Name: N/A    Number of Children: 3  . Years of Education: N/A   Occupational History  . retired   .     Social History Main Topics  . Smoking status: Never Smoker   . Smokeless tobacco: Never Used  . Alcohol Use: No  . Drug Use: No  . Sexually Active: Not on file   Other Topics Concern  . Not on file   Social History Narrative   Still works on a farm.No regular exerciseLives with wife3 children-8 grandchildrenRetired from [employer] tobaccoHad greenhouseGood friend with Dr. Gavin Potters     BP 102/68  Pulse 71  Ht 5\' 6"  (1.676 m)  Wt 143 lb (64.864 kg)  BMI 23.08 kg/m2  SpO2 97%  Physical Exam:  Well appearing NAD HEENT: Unremarkable Neck:  No JVD, no thyromegally Lungs:  Clear with no wheezes, rales, or rhonchi. Well-healed ICD incision. HEART:  Regular rate rhythm, no murmurs, no rubs, no clicks Abd:  soft, positive bowel sounds, no organomegally, no rebound, no guarding Ext:  2 plus pulses, no edema, no cyanosis, no clubbing Skin:  No rashes no nodules Neuro:  CN II through XII intact, motor grossly intact  DEVICE  Normal device function.  See PaceArt for details.   Assess/Plan:

## 2012-01-31 DIAGNOSIS — H1045 Other chronic allergic conjunctivitis: Secondary | ICD-10-CM | POA: Diagnosis not present

## 2012-01-31 DIAGNOSIS — J309 Allergic rhinitis, unspecified: Secondary | ICD-10-CM | POA: Diagnosis not present

## 2012-01-31 DIAGNOSIS — J45909 Unspecified asthma, uncomplicated: Secondary | ICD-10-CM | POA: Diagnosis not present

## 2012-01-31 DIAGNOSIS — J019 Acute sinusitis, unspecified: Secondary | ICD-10-CM | POA: Diagnosis not present

## 2012-02-13 ENCOUNTER — Ambulatory Visit (INDEPENDENT_AMBULATORY_CARE_PROVIDER_SITE_OTHER): Payer: Medicare Other

## 2012-02-13 DIAGNOSIS — Z7901 Long term (current) use of anticoagulants: Secondary | ICD-10-CM | POA: Diagnosis not present

## 2012-02-13 DIAGNOSIS — I4892 Unspecified atrial flutter: Secondary | ICD-10-CM | POA: Diagnosis not present

## 2012-02-13 LAB — POCT INR: INR: 5.5

## 2012-02-21 ENCOUNTER — Ambulatory Visit (INDEPENDENT_AMBULATORY_CARE_PROVIDER_SITE_OTHER): Payer: Medicare Other | Admitting: Cardiology

## 2012-02-21 ENCOUNTER — Encounter: Payer: Self-pay | Admitting: Cardiology

## 2012-02-21 VITALS — BP 116/71 | HR 113 | Ht 66.0 in | Wt 144.6 lb

## 2012-02-21 DIAGNOSIS — I4892 Unspecified atrial flutter: Secondary | ICD-10-CM

## 2012-02-21 DIAGNOSIS — I4891 Unspecified atrial fibrillation: Secondary | ICD-10-CM

## 2012-02-21 DIAGNOSIS — R079 Chest pain, unspecified: Secondary | ICD-10-CM | POA: Diagnosis not present

## 2012-02-21 DIAGNOSIS — I5022 Chronic systolic (congestive) heart failure: Secondary | ICD-10-CM

## 2012-02-21 DIAGNOSIS — E785 Hyperlipidemia, unspecified: Secondary | ICD-10-CM | POA: Diagnosis not present

## 2012-02-21 NOTE — Progress Notes (Signed)
HPI [name] presents for follow up of his cardiomyopathy and atrial flutter.  Since I last saw him he has done relatively well.  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 He's not describing any PND or orthopnea. He's had no weight gain or edema. He's had no chest pressure, neck or arm discomfort.  Allergies  Allergen Reactions  . Clarithromycin Nausea Only  . Famotidine Other (See Comments)    ABD. PAIN  . Omeprazole Diarrhea  . Oxytetracycline     REACTION: BUMPS  . Penicillins     REACTION: SWELLING Amoxicillin ok  . Proton Pump Inhibitors Other (See Comments)    GI upset, diarrhea, gas, bloating  . Ranitidine Diarrhea    Current Outpatient Prescriptions  Medication Sig Dispense Refill  . carvedilol (COREG) 6.25 MG tablet Take 1 tablet (6.25 mg total) by mouth 2 (two) times daily with a meal.  180 tablet  3  . diazepam (VALIUM) 5 MG tablet 1/4 tab at bedtime as needed  30 tablet  0  . docusate sodium (COLACE) 100 MG capsule Take 100 mg by mouth as needed.        . enalapril (VASOTEC) 5 MG tablet Take 1 tablet (5 mg total) by mouth 2 (two) times daily.  180 tablet  3  . flunisolide (NASAREL) 29 MCG/ACT (0.025%) nasal spray 2 sprays by Nasal route daily. Dose is for each nostril.       . Fluticasone-Salmeterol (ADVAIR DISKUS) 100-50 MCG/DOSE AEPB Inhale 1 puff into the lungs as needed.       . furosemide (LASIX) 20 MG tablet Take 10 mg by mouth 2 (two) times daily.      . Loratadine 10 MG CAPS Take 1 capsule by mouth as needed.       . montelukast (SINGULAIR) 10 MG tablet Take 10 mg by mouth daily as needed.        . sucralfate (CARAFATE) 1 G tablet TAKE 1/2 A TABLET BY MOUTH AS NEEDED  30 tablet  3  . warfarin (COUMADIN) 5 MG tablet Take as directed by the anticoagulation clinic  135 tablet  1    Past Medical History  Diagnosis Date  . NICM (nonischemic  cardiomyopathy)     Cardiac catheterization March 2006 without coronary disease; echocardiogram August 2008: EF 35%, mild AI, left atrial enlargement  . Atrial flutter     A.  Status post cardioversion; B.  Tikosyn therapy - failed, remains in aflutte  . COPD (chronic obstructive pulmonary disease)   . LBBB (left bundle branch block)   . GERD (gastroesophageal reflux disease) 2010    h/o esophageal stricture with dilation, LA grade C reflux esophagitis by EGD 2010  . Porphyria   . IBS (irritable bowel syndrome)   . BPH (benign prostatic hyperplasia)   . Diverticulitis   . Allergic rhinitis     [name]  . Hyperlipidemia   . Chronic systolic congestive heart failure   . History of GI bleed     Secondary to hemorrhoids  . Extrinsic asthma     [name]  . 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 Wyn Quaker)    Past Surgical History  Procedure Date  . Penile prosthesis implant 2007    Otelin  . Cholecystectomy   . Goiter removal   . Nose surgery 1971  .  Hernia repair   . Cardiac catheterization 06/22/04    Severe, nonischemia cardiomyopathy,EF 25-30%  . Biv-aicd implant     Medtronic  . Colonoscopy 10/99    Divertic, splenic, hepatic fleure only  . Esophagogastroduodenoscopy 01/1998    stricture, sliding HH, GERD  . Esophageal dilation 02/18/98  . Esophagogastroduodenoscopy 04/08/01    stricture, gastritis, HH, GERD-no dilation(Dr. Marina Goodell)  . Hernia repair 01/30/02    Dr. Daphine Deutscher  . Pulmonary eval 04/2002    ([facility]) Chronic congestive symptoms  . US echocardiography 07/13/04    EF 25-30%, Mod LVH; LA severe dilation; Mild AR; IRTR  . Esophagogastroduodenoscopy 12/22/04    stricture; gastritis; duodenitis, GERD  . US echocardiography 11/27/06    hypokinesis posterior wall, EF 35%; mild AR  . Esophagogastroduodenoscopy 10/21/08    Reflux esophagitis; Erythem. Duod. (Dr. Mechele Collin)  . Colonoscopy 10/21/08    aborted-divertics, int hemms (Dr. Mechele Collin)  . Cardioversion  02/22/09    AFlutter-([facility])  . Coronary angioplasty   . Back surgery 1997    bulging disks    ROS:  As stated in the HPI and negative for all other systems.  PHYSICAL EXAM BP 116/71  Pulse 113  Ht 5\' 6"  (1.676 m)  Wt 144 lb 9.6 oz (65.59 kg)  BMI 23.34 kg/m2 GENERAL:  Well appearing NECK:  No jugular venous distention, waveform within normal limits, carotid upstroke brisk and symmetric, no bruits, no thyromegaly LUNGS:  Clear to auscultation bilaterally BACK:  No CVA tenderness CHEST:  Well healed ICD pocket HEART:  PMI not displaced or sustained,S1 and S2 within normal limits, no S3, no clicks, no rubs, no murmurs ABD:  Flat, positive bowel sounds normal in frequency in pitch, no bruits, no rebound, no guarding, no midline pulsatile mass, no hepatomegaly, no splenomegaly EXT:  2 plus pulses throughout, no edema, no cyanosis no clubbing   EKG:  Atrial flutter, demand ventricular pacemaker.  02/21/2012  ASSESSMENT AND PLAN  Nonischemic cardiomyopathy -  He seems to be euvolemic. At this point, no change in therapy is indicated. We have reviewed salt and fluid restrictions. No further cardiovascular testing is indicated.   Atrial flutter -  The patient tolerates this rhythm and rate control and anticoagulation. We will continue with the meds as listed. He does not want to switch from warfarin.

## 2012-02-21 NOTE — Patient Instructions (Addendum)
Your physician wants you to follow-up in: 6 months with Dr. Hochrein.  You will receive a reminder letter in the mail two months in advance. If you don't receive a letter, please call our office to schedule the follow-up appointment.  

## 2012-02-27 ENCOUNTER — Ambulatory Visit (INDEPENDENT_AMBULATORY_CARE_PROVIDER_SITE_OTHER): Payer: Medicare Other

## 2012-02-27 DIAGNOSIS — Z7901 Long term (current) use of anticoagulants: Secondary | ICD-10-CM | POA: Diagnosis not present

## 2012-02-27 DIAGNOSIS — I4892 Unspecified atrial flutter: Secondary | ICD-10-CM | POA: Diagnosis not present

## 2012-02-27 LAB — POCT INR: INR: 3.3

## 2012-03-03 DIAGNOSIS — H1045 Other chronic allergic conjunctivitis: Secondary | ICD-10-CM | POA: Diagnosis not present

## 2012-03-03 DIAGNOSIS — J45909 Unspecified asthma, uncomplicated: Secondary | ICD-10-CM | POA: Diagnosis not present

## 2012-03-03 DIAGNOSIS — J309 Allergic rhinitis, unspecified: Secondary | ICD-10-CM | POA: Diagnosis not present

## 2012-03-03 DIAGNOSIS — J019 Acute sinusitis, unspecified: Secondary | ICD-10-CM | POA: Diagnosis not present

## 2012-03-19 ENCOUNTER — Ambulatory Visit (INDEPENDENT_AMBULATORY_CARE_PROVIDER_SITE_OTHER): Payer: Medicare Other

## 2012-03-19 DIAGNOSIS — Z7901 Long term (current) use of anticoagulants: Secondary | ICD-10-CM

## 2012-03-19 DIAGNOSIS — I4892 Unspecified atrial flutter: Secondary | ICD-10-CM | POA: Diagnosis not present

## 2012-03-19 LAB — POCT INR: INR: 3.8

## 2012-03-20 DIAGNOSIS — H1045 Other chronic allergic conjunctivitis: Secondary | ICD-10-CM | POA: Diagnosis not present

## 2012-03-20 DIAGNOSIS — J019 Acute sinusitis, unspecified: Secondary | ICD-10-CM | POA: Diagnosis not present

## 2012-03-20 DIAGNOSIS — J45909 Unspecified asthma, uncomplicated: Secondary | ICD-10-CM | POA: Diagnosis not present

## 2012-03-20 DIAGNOSIS — J309 Allergic rhinitis, unspecified: Secondary | ICD-10-CM | POA: Diagnosis not present

## 2012-03-21 ENCOUNTER — Telehealth: Payer: Self-pay

## 2012-03-21 NOTE — Telephone Encounter (Signed)
Pt called states he saw MD and he rx 4 day Prednisone taper starting today 4-3-2-1.  Last time pt took Prednisone taper INR was 5.   Instructed pt to hold Coumadin dosage on Saturday, and made appt for INR on 03/26/12 in [place].

## 2012-03-24 ENCOUNTER — Ambulatory Visit: Payer: Self-pay | Admitting: Allergy

## 2012-03-24 DIAGNOSIS — J329 Chronic sinusitis, unspecified: Secondary | ICD-10-CM | POA: Diagnosis not present

## 2012-03-26 ENCOUNTER — Ambulatory Visit (INDEPENDENT_AMBULATORY_CARE_PROVIDER_SITE_OTHER): Payer: Medicare Other

## 2012-03-26 DIAGNOSIS — I4892 Unspecified atrial flutter: Secondary | ICD-10-CM | POA: Diagnosis not present

## 2012-03-26 DIAGNOSIS — Z7901 Long term (current) use of anticoagulants: Secondary | ICD-10-CM

## 2012-03-26 LAB — POCT INR: INR: 1.9

## 2012-04-10 ENCOUNTER — Ambulatory Visit (INDEPENDENT_AMBULATORY_CARE_PROVIDER_SITE_OTHER): Payer: Medicare Other

## 2012-04-10 DIAGNOSIS — Z7901 Long term (current) use of anticoagulants: Secondary | ICD-10-CM | POA: Diagnosis not present

## 2012-04-10 DIAGNOSIS — I4892 Unspecified atrial flutter: Secondary | ICD-10-CM | POA: Diagnosis not present

## 2012-04-10 LAB — POCT INR: INR: 2.2

## 2012-04-17 ENCOUNTER — Other Ambulatory Visit: Payer: Self-pay | Admitting: Family Medicine

## 2012-04-17 ENCOUNTER — Other Ambulatory Visit (INDEPENDENT_AMBULATORY_CARE_PROVIDER_SITE_OTHER): Payer: Medicare Other

## 2012-04-17 DIAGNOSIS — D696 Thrombocytopenia, unspecified: Secondary | ICD-10-CM

## 2012-04-17 DIAGNOSIS — I5022 Chronic systolic (congestive) heart failure: Secondary | ICD-10-CM | POA: Diagnosis not present

## 2012-04-17 DIAGNOSIS — E049 Nontoxic goiter, unspecified: Secondary | ICD-10-CM | POA: Diagnosis not present

## 2012-04-17 DIAGNOSIS — E785 Hyperlipidemia, unspecified: Secondary | ICD-10-CM

## 2012-04-17 LAB — BASIC METABOLIC PANEL
BUN: 21 mg/dL (ref 6–23)
CO2: 29 mEq/L (ref 19–32)
Calcium: 9.1 mg/dL (ref 8.4–10.5)
Chloride: 103 mEq/L (ref 96–112)
Creatinine, Ser: 1.1 mg/dL (ref 0.4–1.5)
GFR: 71.71 mL/min (ref >60.00)
Glucose, Bld: 98 mg/dL (ref 70–99)
Potassium: 4.4 mEq/L (ref 3.5–5.1)
Sodium: 138 mEq/L (ref 135–145)

## 2012-04-17 LAB — CBC WITH DIFFERENTIAL/PLATELET
Basophils Absolute: 0.1 10*3/uL (ref 0.0–0.1)
Basophils Relative: 1 % (ref 0.0–3.0)
Eosinophils Absolute: 0.1 10*3/uL (ref 0.0–0.7)
Eosinophils Relative: 2 % (ref 0.0–5.0)
HCT: 42 % (ref 39.0–52.0)
Hemoglobin: 14 g/dL (ref 13.0–17.0)
Lymphocytes Relative: 29.9 % (ref 12.0–46.0)
Lymphs Abs: 1.9 10*3/uL (ref 0.7–4.0)
MCHC: 33.3 g/dL (ref 30.0–36.0)
MCV: 87.4 fl (ref 78.0–100.0)
Monocytes Absolute: 0.4 10*3/uL (ref 0.1–1.0)
Monocytes Relative: 7 % (ref 3.0–12.0)
Neutro Abs: 3.8 10*3/uL (ref 1.4–7.7)
Neutrophils Relative %: 60.1 % (ref 43.0–77.0)
Platelets: 130 10*3/uL — ABNORMAL LOW (ref 150.0–400.0)
RBC: 4.8 Mil/uL (ref 4.22–5.81)
RDW: 16.1 % — ABNORMAL HIGH (ref 11.5–14.6)
WBC: 6.4 10*3/uL (ref 4.5–10.5)

## 2012-04-17 LAB — TSH: TSH: 2.58 u[IU]/mL (ref 0.35–5.50)

## 2012-04-17 LAB — LIPID PANEL
Cholesterol: 156 mg/dL (ref 0–200)
HDL: 46.7 mg/dL (ref >39.00)
LDL Cholesterol: 86 mg/dL (ref 0–99)
Total CHOL/HDL Ratio: 3
Triglycerides: 115 mg/dL (ref 0.0–149.0)
VLDL: 23 mg/dL (ref 0.0–40.0)

## 2012-04-22 ENCOUNTER — Encounter: Payer: Self-pay | Admitting: Family Medicine

## 2012-04-22 ENCOUNTER — Ambulatory Visit (INDEPENDENT_AMBULATORY_CARE_PROVIDER_SITE_OTHER): Payer: Medicare Other | Admitting: Family Medicine

## 2012-04-22 VITALS — BP 126/84 | HR 84 | Temp 97.8°F | Ht 66.25 in | Wt 146.8 lb

## 2012-04-22 DIAGNOSIS — E538 Deficiency of other specified B group vitamins: Secondary | ICD-10-CM

## 2012-04-22 DIAGNOSIS — E785 Hyperlipidemia, unspecified: Secondary | ICD-10-CM

## 2012-04-22 DIAGNOSIS — M5432 Sciatica, left side: Secondary | ICD-10-CM

## 2012-04-22 DIAGNOSIS — D696 Thrombocytopenia, unspecified: Secondary | ICD-10-CM

## 2012-04-22 DIAGNOSIS — K219 Gastro-esophageal reflux disease without esophagitis: Secondary | ICD-10-CM

## 2012-04-22 DIAGNOSIS — Z0000 Encounter for general adult medical examination without abnormal findings: Secondary | ICD-10-CM | POA: Diagnosis not present

## 2012-04-22 DIAGNOSIS — I4892 Unspecified atrial flutter: Secondary | ICD-10-CM

## 2012-04-22 DIAGNOSIS — I728 Aneurysm of other specified arteries: Secondary | ICD-10-CM

## 2012-04-22 DIAGNOSIS — I429 Cardiomyopathy, unspecified: Secondary | ICD-10-CM

## 2012-04-22 DIAGNOSIS — M5430 Sciatica, unspecified side: Secondary | ICD-10-CM | POA: Diagnosis not present

## 2012-04-22 MED ORDER — DIAZEPAM 5 MG PO TABS: ORAL_TABLET | ORAL | Status: DC

## 2012-04-22 NOTE — Assessment & Plan Note (Signed)
Stable, followed by cards.

## 2012-04-22 NOTE — Assessment & Plan Note (Signed)
Cannot tolerate PPIs.  Takes carafate and watches diet.

## 2012-04-22 NOTE — Patient Instructions (Signed)
Look into sudoku.  Good to see you today, call us with questions.

## 2012-04-22 NOTE — Assessment & Plan Note (Signed)
Reviewed #s.  Actually this year improved compared to previously.

## 2012-04-22 NOTE — Assessment & Plan Note (Signed)
Does not recognize flutter - has been recommended against further intervention.

## 2012-04-22 NOTE — Assessment & Plan Note (Signed)
No longer on B12 supplement.  Consider recheck next blood work. Lab Results  Component Value Date   VITAMINB12 297 06/08/2011

## 2012-04-22 NOTE — Assessment & Plan Note (Addendum)
I have personally reviewed the Medicare Annual Wellness questionnaire and have noted 1. The patient's medical and social history 2. Their use of alcohol, tobacco or illicit drugs 3. Their current medications and supplements 4. The patient's functional ability including ADL's, fall risks, home safety risks and hearing or visual impairment. 5. Diet and physical activity 6. Evidence for depression or mood disorders The patients weight, height, BMI have been recorded in the chart.  Hearing and vision has been addressed. I have made referrals, counseling and provided education to the patient based review of the above and I have provided the pt with a written personalized care plan for preventive services. See scanned questionairre. Advanced directives discussed: would not want prolonged life support.  Would want wife to make medical decisions if he is unable.  Reviewed preventative protocols and updated unless pt declined. Declines flu and pneumonia shots. Declines audiology eval as would not want hearing aides. Some trouble with recall and concentration noted today.  Advised keep mind active , look into sudoku (likes math > reading), will recheck next visit.

## 2012-04-22 NOTE — Progress Notes (Signed)
Subjective:    Patient ID: [name], male    DOB: 1931-06-28, 79 y.o.   MRN: [id number]  HPI CC: medicare wellness visit  79 y.o. with h/o nonischemic cardiomyopathy with an EF of 35%, chronic systolic congestive heart failure, atrial flutter failed Tikosyn therapy so stopped, chronic Coumadin therapy, status post Bi-V-ICD implantation, COPD, HLD, GERD presents today for medicare wellness visit.   H/o celiac artery aneurysm, has f/u ultrasoiund scheduled for next week (Dr. Wyn Quaker).  Some trouble at higher frequencies on hearing screen - declines audiology referral. Passes hearing screen. Denies falls in last year, denies depression/anhedonia.  Preventative: No recent CPE Colonoscopy - 2010 [facility]) aborted early 2/2 significant diverticulosis. Td 2011 zostavax 2008 Flu shot - declines Pneumovax - declines today Advanced directive: Wouldn't want prolonged life support.  Wife would be medical decision maker.  Has defibrillator in place.  Medications and allergies reviewed and updated in chart.  Past histories reviewed and updated if relevant as below. Patient Active Problem List  Diagnosis  . GOITER  . HYPERLIPIDEMIA  . PORPHYRIA  . ANXIETY STATE, UNSPECIFIED  . Nonischemic cardiomyopathy  . LBBB  . Atrial flutter  . Chronic systolic heart failure  . ALLERGIC RHINITIS  . ESOPHAGEAL STRICTURE  . GERD  . DIVERTICULOSIS OF COLON  . IBS  . BENIGN PROSTATIC HYPERTROPHY  . CHEST PAIN  . DIVERTICULITIS, HX OF  . CRT-ICD-Medtronic  . Long term current use of anticoagulant  . Constipation  . Sinus congestion  . B12 deficiency  . Thrombocytopenia  . Left sided sciatica   Past Medical History  Diagnosis Date  . NICM (nonischemic cardiomyopathy)     Cardiac catheterization March 2006 without coronary disease; echocardiogram August 2008: EF 35%, mild AI, left atrial enlargement  . Atrial flutter     A.  Status post cardioversion; B.  Tikosyn therapy - failed,  remains in aflutte  . COPD (chronic obstructive pulmonary disease)   . LBBB (left bundle branch block)   . GERD (gastroesophageal reflux disease) 2010    h/o esophageal stricture with dilation, LA grade C reflux esophagitis by EGD 2010  . Porphyria   . IBS (irritable bowel syndrome)   . BPH (benign prostatic hyperplasia)   . Diverticulitis   . Allergic rhinitis     [name]  . Hyperlipidemia   . Chronic systolic congestive heart failure   . History of GI bleed     Secondary to hemorrhoids  . Extrinsic asthma     [name]  . 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 Wyn Quaker)   Past Surgical History  Procedure Date  . Penile prosthesis implant 2007    Otelin  . Cholecystectomy   . Goiter removal   . Nose surgery 1971  . Hernia repair   . Cardiac catheterization 06/22/04    Severe, nonischemia cardiomyopathy,EF 25-30%  . Biv-aicd implant     Medtronic  . Colonoscopy 10/99    Divertic, splenic, hepatic fleure only  . Esophagogastroduodenoscopy 01/1998    stricture, sliding HH, GERD  . Esophageal dilation 02/18/98  . Esophagogastroduodenoscopy 04/08/01    stricture, gastritis, HH, GERD-no dilation(Dr. Marina Goodell)  . Hernia repair 01/30/02    Dr. Daphine Deutscher  . Pulmonary eval 04/2002    ([facility]) Chronic congestive symptoms  . US echocardiography 07/13/04    EF 25-30%, Mod LVH; LA severe dilation; Mild AR; IRTR  . Esophagogastroduodenoscopy 12/22/04    stricture; gastritis; duodenitis, GERD  . US  echocardiography 11/27/06    hypokinesis posterior wall, EF 35%; mild AR  . Esophagogastroduodenoscopy 10/21/08    Reflux esophagitis; Erythem. Duod. (Dr. Mechele Collin)  . Colonoscopy 10/21/08    aborted-divertics, int hemms (Dr. Mechele Collin)  . Cardioversion 02/22/09    AFlutter-([facility])  . Coronary angioplasty   . Back surgery 1997    bulging disks   History  Substance Use Topics  . Smoking status: Never Smoker   . Smokeless tobacco: Never Used  . Alcohol Use: No   Family History    Problem Relation Age of Onset  . Coronary artery disease Father   . Hypertension Father   . Dysphagia Sister   . Breast cancer    . Ovarian cancer    . Uterine cancer    . Heart failure Mother   . Other Sister     stomach problems  . Other Sister     stomach problems  . Other Sister     stomach problems  . Alcohol abuse Maternal Uncle    Allergies  Allergen Reactions  . Clarithromycin Nausea Only  . Famotidine Other (See Comments)    ABD. PAIN  . Omeprazole Diarrhea  . Oxytetracycline     REACTION: BUMPS  . Penicillins     REACTION: SWELLING Amoxicillin ok  . Proton Pump Inhibitors Other (See Comments)    GI upset, diarrhea, gas, bloating  . Ranitidine Diarrhea   Current Outpatient Prescriptions on File Prior to Visit  Medication Sig Dispense Refill  . carvedilol (COREG) 6.25 MG tablet Take 1 tablet (6.25 mg total) by mouth 2 (two) times daily with a meal.  180 tablet  3  . enalapril (VASOTEC) 5 MG tablet Take 1 tablet (5 mg total) by mouth 2 (two) times daily.  180 tablet  3  . flunisolide (NASAREL) 29 MCG/ACT (0.025%) nasal spray 2 sprays by Nasal route daily. Dose is for each nostril.       . Fluticasone-Salmeterol (ADVAIR DISKUS) 100-50 MCG/DOSE AEPB Inhale 1 puff into the lungs as needed.       . furosemide (LASIX) 20 MG tablet Take 10 mg by mouth 2 (two) times daily.      . Loratadine 10 MG CAPS Take 1 capsule by mouth as needed.       . montelukast (SINGULAIR) 10 MG tablet Take 10 mg by mouth daily as needed.        . sucralfate (CARAFATE) 1 G tablet TAKE 1/2 A TABLET BY MOUTH AS NEEDED  30 tablet  3  . warfarin (COUMADIN) 5 MG tablet Take as directed by the anticoagulation clinic  135 tablet  1     Review of Systems  Constitutional: Negative for fever, chills, activity change, appetite change, fatigue and unexpected weight change.  HENT: Positive for congestion. Negative for hearing loss and neck pain.   Eyes: Negative for visual disturbance.  Respiratory:  Negative for cough, chest tightness, shortness of breath and wheezing.   Cardiovascular: Negative for chest pain, palpitations and leg swelling.  Gastrointestinal: Negative for nausea, vomiting, abdominal pain, diarrhea, constipation, blood in stool and abdominal distention.  Genitourinary: Negative for hematuria and difficulty urinating.  Musculoskeletal: Negative for myalgias and arthralgias.  Skin: Negative for rash.  Neurological: Negative for dizziness, seizures, syncope and headaches.  Hematological: Does not bruise/bleed easily.  Psychiatric/Behavioral: Negative for dysphoric mood. The patient is not nervous/anxious.        Objective:   Physical Exam  Nursing note and vitals reviewed. Constitutional: He is oriented  to person, place, and time. He appears well-developed and well-nourished. No distress.  HENT:  Head: Normocephalic and atraumatic.  Right Ear: Hearing, tympanic membrane, external ear and ear canal normal.  Left Ear: Hearing, tympanic membrane, external ear and ear canal normal.  Nose: Nose normal.  Mouth/Throat: Uvula is midline, oropharynx is clear and moist and mucous membranes are normal. No oropharyngeal exudate, posterior oropharyngeal edema, posterior oropharyngeal erythema or tonsillar abscesses.  Eyes: Conjunctivae normal and EOM are normal. Pupils are equal, round, and reactive to light. No scleral icterus.  Neck: Normal range of motion. Neck supple. No thyromegaly present.  Cardiovascular: Normal heart sounds and intact distal pulses.  An irregular rhythm present. Tachycardia present.   No murmur heard. Pulses:      Radial pulses are 2+ on the right side, and 2+ on the left side.  Pulmonary/Chest: Effort normal and breath sounds normal. No respiratory distress. He has no wheezes. He has no rales.  Abdominal: Soft. Bowel sounds are normal. He exhibits no distension and no mass. There is no tenderness. There is no rebound and no guarding.  Musculoskeletal:  Normal range of motion. He exhibits no edema.  Lymphadenopathy:    He has no cervical adenopathy.  Neurological: He is alert and oriented to person, place, and time.       CN grossly intact, station and gait intact  Skin: Skin is warm and dry. No rash noted.  Psychiatric: He has a normal mood and affect. His behavior is normal. Judgment and thought content normal.       Assessment & Plan:

## 2012-04-22 NOTE — Assessment & Plan Note (Signed)
Reviewed FLP - normal given h/o nonischemic CM.  Will remove from problem list.

## 2012-04-22 NOTE — Assessment & Plan Note (Signed)
Has f/u imaging scheduled next week.

## 2012-04-22 NOTE — Assessment & Plan Note (Signed)
Continued intermittent issue with this, worse if more active.

## 2012-04-28 ENCOUNTER — Ambulatory Visit (INDEPENDENT_AMBULATORY_CARE_PROVIDER_SITE_OTHER): Payer: Medicare Other | Admitting: *Deleted

## 2012-04-28 ENCOUNTER — Encounter: Payer: Self-pay | Admitting: Internal Medicine

## 2012-04-28 DIAGNOSIS — I429 Cardiomyopathy, unspecified: Secondary | ICD-10-CM | POA: Diagnosis not present

## 2012-04-28 DIAGNOSIS — I5022 Chronic systolic (congestive) heart failure: Secondary | ICD-10-CM | POA: Diagnosis not present

## 2012-04-28 DIAGNOSIS — Z95810 Presence of automatic (implantable) cardiac defibrillator: Secondary | ICD-10-CM

## 2012-04-30 LAB — REMOTE ICD DEVICE
AL AMPLITUDE: 5.9 mv
AL IMPEDENCE ICD: 437 Ohm
AL THRESHOLD: 0.5 V
ATRIAL PACING ICD: 0 pct
BAMS-0001: 170 {beats}/min
BATTERY VOLTAGE: 3.0878 V
CHARGE TIME: 9.419 s
FVT: 0
LV LEAD IMPEDENCE ICD: 418 Ohm
LV LEAD THRESHOLD: 0.625 V
PACEART VT: 0
RV LEAD AMPLITUDE: 18.5 mv
RV LEAD IMPEDENCE ICD: 513 Ohm
RV LEAD THRESHOLD: 0.75 V
TOT-0001: 1
TOT-0002: 0
TOT-0006: 20120416000000
TZAT-0001ATACH: 1
TZAT-0001ATACH: 2
TZAT-0001ATACH: 3
TZAT-0001FASTVT: 1
TZAT-0001SLOWVT: 1
TZAT-0001SLOWVT: 2
TZAT-0002ATACH: NEGATIVE
TZAT-0002ATACH: NEGATIVE
TZAT-0002ATACH: NEGATIVE
TZAT-0002FASTVT: NEGATIVE
TZAT-0004SLOWVT: 6
TZAT-0004SLOWVT: 8
TZAT-0005SLOWVT: 84 pct
TZAT-0005SLOWVT: 91 pct
TZAT-0011SLOWVT: 10 ms
TZAT-0011SLOWVT: 10 ms
TZAT-0012ATACH: 150 ms
TZAT-0012ATACH: 150 ms
TZAT-0012ATACH: 150 ms
TZAT-0012FASTVT: 200 ms
TZAT-0012SLOWVT: 200 ms
TZAT-0012SLOWVT: 200 ms
TZAT-0013SLOWVT: 2
TZAT-0013SLOWVT: 2
TZAT-0018ATACH: NEGATIVE
TZAT-0018ATACH: NEGATIVE
TZAT-0018ATACH: NEGATIVE
TZAT-0018FASTVT: NEGATIVE
TZAT-0018SLOWVT: NEGATIVE
TZAT-0018SLOWVT: NEGATIVE
TZAT-0019ATACH: 6 V
TZAT-0019ATACH: 6 V
TZAT-0019ATACH: 6 V
TZAT-0019FASTVT: 8 V
TZAT-0019SLOWVT: 8 V
TZAT-0019SLOWVT: 8 V
TZAT-0020ATACH: 1.5 ms
TZAT-0020ATACH: 1.5 ms
TZAT-0020ATACH: 1.5 ms
TZAT-0020FASTVT: 1.5 ms
TZAT-0020SLOWVT: 1.5 ms
TZAT-0020SLOWVT: 1.5 ms
TZON-0003ATACH: 350 ms
TZON-0003SLOWVT: 340 ms
TZON-0003VSLOWVT: 450 ms
TZON-0004SLOWVT: 32
TZON-0004VSLOWVT: 20
TZON-0005SLOWVT: 12
TZST-0001ATACH: 4
TZST-0001ATACH: 5
TZST-0001ATACH: 6
TZST-0001FASTVT: 2
TZST-0001FASTVT: 3
TZST-0001FASTVT: 4
TZST-0001FASTVT: 5
TZST-0001FASTVT: 6
TZST-0001SLOWVT: 3
TZST-0001SLOWVT: 4
TZST-0001SLOWVT: 5
TZST-0001SLOWVT: 6
TZST-0002ATACH: NEGATIVE
TZST-0002ATACH: NEGATIVE
TZST-0002ATACH: NEGATIVE
TZST-0002FASTVT: NEGATIVE
TZST-0002FASTVT: NEGATIVE
TZST-0002FASTVT: NEGATIVE
TZST-0002FASTVT: NEGATIVE
TZST-0002FASTVT: NEGATIVE
TZST-0003SLOWVT: 15 J
TZST-0003SLOWVT: 28 J
TZST-0003SLOWVT: 35 J
TZST-0003SLOWVT: 35 J
VENTRICULAR PACING ICD: 58.24 pct
VF: 0

## 2012-05-08 ENCOUNTER — Encounter: Payer: Self-pay | Admitting: *Deleted

## 2012-05-13 DIAGNOSIS — L723 Sebaceous cyst: Secondary | ICD-10-CM | POA: Diagnosis not present

## 2012-05-13 DIAGNOSIS — Z0189 Encounter for other specified special examinations: Secondary | ICD-10-CM | POA: Diagnosis not present

## 2012-05-13 DIAGNOSIS — L570 Actinic keratosis: Secondary | ICD-10-CM | POA: Diagnosis not present

## 2012-05-14 ENCOUNTER — Ambulatory Visit (INDEPENDENT_AMBULATORY_CARE_PROVIDER_SITE_OTHER): Payer: Medicare Other

## 2012-05-14 DIAGNOSIS — Z7901 Long term (current) use of anticoagulants: Secondary | ICD-10-CM

## 2012-05-14 DIAGNOSIS — I4892 Unspecified atrial flutter: Secondary | ICD-10-CM | POA: Diagnosis not present

## 2012-05-14 LAB — POCT INR: INR: 2

## 2012-05-20 DIAGNOSIS — J45909 Unspecified asthma, uncomplicated: Secondary | ICD-10-CM | POA: Diagnosis not present

## 2012-05-20 DIAGNOSIS — J301 Allergic rhinitis due to pollen: Secondary | ICD-10-CM | POA: Diagnosis not present

## 2012-05-20 DIAGNOSIS — J018 Other acute sinusitis: Secondary | ICD-10-CM | POA: Diagnosis not present

## 2012-05-20 DIAGNOSIS — J328 Other chronic sinusitis: Secondary | ICD-10-CM | POA: Diagnosis not present

## 2012-05-23 ENCOUNTER — Telehealth: Payer: Self-pay | Admitting: Nurse Practitioner

## 2012-05-23 NOTE — Telephone Encounter (Signed)
Pts wife called in this evening stating that he is due to have sinus surgery on Thursday of next week and she is wondering when he needs to stop his coumadin.  I advised that most surgeons prefer 3-5 days but that she should check with the surgeon's office to get their specific requirement.  He does not have prior h/o CVA and has never been bridged with lovenox before.

## 2012-05-26 ENCOUNTER — Ambulatory Visit: Payer: Self-pay | Admitting: Otolaryngology

## 2012-05-26 ENCOUNTER — Other Ambulatory Visit: Payer: Self-pay | Admitting: Anesthesiology

## 2012-05-26 ENCOUNTER — Telehealth: Payer: Self-pay | Admitting: Cardiology

## 2012-05-26 DIAGNOSIS — I499 Cardiac arrhythmia, unspecified: Secondary | ICD-10-CM | POA: Diagnosis not present

## 2012-05-26 DIAGNOSIS — Z01810 Encounter for preprocedural cardiovascular examination: Secondary | ICD-10-CM | POA: Diagnosis not present

## 2012-05-26 NOTE — Telephone Encounter (Signed)
New problem    Surgery on 2/20.    Status of cardiac clearance . Fax request on 2/11.

## 2012-05-26 NOTE — Telephone Encounter (Signed)
I agree with the stated plan.  Resume warfarin when OK with the surgeon.

## 2012-05-26 NOTE — Telephone Encounter (Signed)
**Note De-Identified [name] Obfuscation** Note faxed to [facility] at Dr. Ophelia Charter office at [phone].

## 2012-05-26 NOTE — Telephone Encounter (Signed)
**Note De-Identified Artice Bergerson Obfuscation** Kriste Basque is advised that Dr. Antoine Poche is not in the office today but will be here tomorrow. She states that they advised the pt to stop taking Coumadin on Sat. 2/15 and that his surgery is scheduled on 05/29/12 (Thursday). Will forward note to Dr. Antoine Poche and his nurse, Elita Quick.

## 2012-05-29 ENCOUNTER — Ambulatory Visit: Payer: Self-pay | Admitting: Otolaryngology

## 2012-05-29 DIAGNOSIS — R609 Edema, unspecified: Secondary | ICD-10-CM | POA: Diagnosis not present

## 2012-05-29 DIAGNOSIS — J322 Chronic ethmoidal sinusitis: Secondary | ICD-10-CM | POA: Diagnosis not present

## 2012-05-29 DIAGNOSIS — I428 Other cardiomyopathies: Secondary | ICD-10-CM | POA: Diagnosis not present

## 2012-05-29 DIAGNOSIS — I10 Essential (primary) hypertension: Secondary | ICD-10-CM | POA: Diagnosis not present

## 2012-05-29 DIAGNOSIS — Z79899 Other long term (current) drug therapy: Secondary | ICD-10-CM | POA: Diagnosis not present

## 2012-05-29 DIAGNOSIS — I4892 Unspecified atrial flutter: Secondary | ICD-10-CM | POA: Diagnosis not present

## 2012-05-29 DIAGNOSIS — Z881 Allergy status to other antibiotic agents status: Secondary | ICD-10-CM | POA: Diagnosis not present

## 2012-05-29 DIAGNOSIS — J329 Chronic sinusitis, unspecified: Secondary | ICD-10-CM | POA: Diagnosis not present

## 2012-05-29 DIAGNOSIS — G43909 Migraine, unspecified, not intractable, without status migrainosus: Secondary | ICD-10-CM | POA: Diagnosis not present

## 2012-05-29 DIAGNOSIS — J320 Chronic maxillary sinusitis: Secondary | ICD-10-CM | POA: Diagnosis not present

## 2012-05-29 DIAGNOSIS — Z950 Presence of cardiac pacemaker: Secondary | ICD-10-CM | POA: Diagnosis not present

## 2012-05-29 DIAGNOSIS — Z880 Allergy status to penicillin: Secondary | ICD-10-CM | POA: Diagnosis not present

## 2012-05-29 DIAGNOSIS — Z7901 Long term (current) use of anticoagulants: Secondary | ICD-10-CM | POA: Diagnosis not present

## 2012-05-29 DIAGNOSIS — Z888 Allergy status to other drugs, medicaments and biological substances status: Secondary | ICD-10-CM | POA: Diagnosis not present

## 2012-05-29 DIAGNOSIS — N419 Inflammatory disease of prostate, unspecified: Secondary | ICD-10-CM | POA: Diagnosis not present

## 2012-05-29 DIAGNOSIS — N400 Enlarged prostate without lower urinary tract symptoms: Secondary | ICD-10-CM | POA: Diagnosis not present

## 2012-05-29 DIAGNOSIS — I4460 Unspecified fascicular block: Secondary | ICD-10-CM | POA: Diagnosis not present

## 2012-05-29 LAB — PROTIME-INR
INR: 1
Prothrombin Time: 13.9 secs (ref 11.5–14.7)

## 2012-06-02 LAB — PATHOLOGY REPORT

## 2012-06-03 DIAGNOSIS — J018 Other acute sinusitis: Secondary | ICD-10-CM | POA: Diagnosis not present

## 2012-06-03 DIAGNOSIS — J328 Other chronic sinusitis: Secondary | ICD-10-CM | POA: Diagnosis not present

## 2012-06-10 DIAGNOSIS — J328 Other chronic sinusitis: Secondary | ICD-10-CM | POA: Diagnosis not present

## 2012-06-10 DIAGNOSIS — J018 Other acute sinusitis: Secondary | ICD-10-CM | POA: Diagnosis not present

## 2012-06-18 ENCOUNTER — Ambulatory Visit (INDEPENDENT_AMBULATORY_CARE_PROVIDER_SITE_OTHER): Payer: Medicare Other

## 2012-06-18 DIAGNOSIS — I4892 Unspecified atrial flutter: Secondary | ICD-10-CM

## 2012-06-18 DIAGNOSIS — Z7901 Long term (current) use of anticoagulants: Secondary | ICD-10-CM

## 2012-06-18 LAB — POCT INR: INR: 3.7

## 2012-06-19 LAB — CULTURE, FUNGUS WITHOUT SMEAR

## 2012-06-27 ENCOUNTER — Telehealth: Payer: Self-pay

## 2012-06-27 DIAGNOSIS — J320 Chronic maxillary sinusitis: Secondary | ICD-10-CM | POA: Diagnosis not present

## 2012-06-27 DIAGNOSIS — J322 Chronic ethmoidal sinusitis: Secondary | ICD-10-CM | POA: Diagnosis not present

## 2012-06-27 NOTE — Telephone Encounter (Signed)
Called spoke with pt, pt states rx Prednisone by ENT today.  Called [facility] Prednisone 40mg  qd x 5 days.  Returned call to pt discussed dosage with Weston Brass, Pharm D advised to to take 2.5mg  on Sat and Sun and 5mg  on Mondays, then resume same dosage of Coumadin.  Keep scheduled f/u on 07/09/12.

## 2012-06-27 NOTE — Telephone Encounter (Signed)
Pt states he had nose surgery, and they Dr. Dewayne Hatch to give him prednisone. States he was told to call Melissa and let you know so his Warfarin can be decreased. Please advise

## 2012-06-27 NOTE — Telephone Encounter (Signed)
Can you help me with this?

## 2012-07-09 ENCOUNTER — Ambulatory Visit (INDEPENDENT_AMBULATORY_CARE_PROVIDER_SITE_OTHER): Payer: Medicare Other

## 2012-07-09 DIAGNOSIS — I4892 Unspecified atrial flutter: Secondary | ICD-10-CM | POA: Diagnosis not present

## 2012-07-09 DIAGNOSIS — Z7901 Long term (current) use of anticoagulants: Secondary | ICD-10-CM

## 2012-07-09 LAB — POCT INR: INR: 2

## 2012-07-11 DIAGNOSIS — J320 Chronic maxillary sinusitis: Secondary | ICD-10-CM | POA: Diagnosis not present

## 2012-07-11 DIAGNOSIS — J322 Chronic ethmoidal sinusitis: Secondary | ICD-10-CM | POA: Diagnosis not present

## 2012-07-16 ENCOUNTER — Other Ambulatory Visit: Payer: Self-pay | Admitting: *Deleted

## 2012-07-16 MED ORDER — WARFARIN SODIUM 5 MG PO TABS: ORAL_TABLET | ORAL | Status: DC

## 2012-07-21 ENCOUNTER — Other Ambulatory Visit: Payer: Self-pay

## 2012-07-21 MED ORDER — WARFARIN SODIUM 5 MG PO TABS: ORAL_TABLET | ORAL | Status: DC

## 2012-07-28 ENCOUNTER — Other Ambulatory Visit: Payer: Self-pay | Admitting: Internal Medicine

## 2012-07-28 ENCOUNTER — Encounter: Payer: Self-pay | Admitting: Internal Medicine

## 2012-07-28 ENCOUNTER — Ambulatory Visit (INDEPENDENT_AMBULATORY_CARE_PROVIDER_SITE_OTHER): Payer: Medicare Other | Admitting: *Deleted

## 2012-07-28 DIAGNOSIS — I429 Cardiomyopathy, unspecified: Secondary | ICD-10-CM | POA: Diagnosis not present

## 2012-07-28 DIAGNOSIS — Z95810 Presence of automatic (implantable) cardiac defibrillator: Secondary | ICD-10-CM | POA: Diagnosis not present

## 2012-07-28 DIAGNOSIS — I5022 Chronic systolic (congestive) heart failure: Secondary | ICD-10-CM

## 2012-08-04 LAB — REMOTE ICD DEVICE
AL AMPLITUDE: 4.6 mv
AL IMPEDENCE ICD: 418 Ohm
AL THRESHOLD: 0.5 V
ATRIAL PACING ICD: 0 pct
BAMS-0001: 170 {beats}/min
BATTERY VOLTAGE: 3.0332 V
CHARGE TIME: 9.889 s
FVT: 0
LV LEAD IMPEDENCE ICD: 418 Ohm
LV LEAD THRESHOLD: 0.75 V
PACEART VT: 0
RV LEAD AMPLITUDE: 17.8 mv
RV LEAD IMPEDENCE ICD: 513 Ohm
RV LEAD THRESHOLD: 0.875 V
TOT-0001: 1
TOT-0002: 0
TOT-0006: 20120416000000
TZAT-0001ATACH: 1
TZAT-0001ATACH: 2
TZAT-0001ATACH: 3
TZAT-0001FASTVT: 1
TZAT-0001SLOWVT: 1
TZAT-0001SLOWVT: 2
TZAT-0002ATACH: NEGATIVE
TZAT-0002ATACH: NEGATIVE
TZAT-0002ATACH: NEGATIVE
TZAT-0002FASTVT: NEGATIVE
TZAT-0004SLOWVT: 6
TZAT-0004SLOWVT: 8
TZAT-0005SLOWVT: 84 pct
TZAT-0005SLOWVT: 91 pct
TZAT-0011SLOWVT: 10 ms
TZAT-0011SLOWVT: 10 ms
TZAT-0012ATACH: 150 ms
TZAT-0012ATACH: 150 ms
TZAT-0012ATACH: 150 ms
TZAT-0012FASTVT: 200 ms
TZAT-0012SLOWVT: 200 ms
TZAT-0012SLOWVT: 200 ms
TZAT-0013SLOWVT: 2
TZAT-0013SLOWVT: 2
TZAT-0018ATACH: NEGATIVE
TZAT-0018ATACH: NEGATIVE
TZAT-0018ATACH: NEGATIVE
TZAT-0018FASTVT: NEGATIVE
TZAT-0018SLOWVT: NEGATIVE
TZAT-0018SLOWVT: NEGATIVE
TZAT-0019ATACH: 6 V
TZAT-0019ATACH: 6 V
TZAT-0019ATACH: 6 V
TZAT-0019FASTVT: 8 V
TZAT-0019SLOWVT: 8 V
TZAT-0019SLOWVT: 8 V
TZAT-0020ATACH: 1.5 ms
TZAT-0020ATACH: 1.5 ms
TZAT-0020ATACH: 1.5 ms
TZAT-0020FASTVT: 1.5 ms
TZAT-0020SLOWVT: 1.5 ms
TZAT-0020SLOWVT: 1.5 ms
TZON-0003ATACH: 350 ms
TZON-0003SLOWVT: 340 ms
TZON-0003VSLOWVT: 450 ms
TZON-0004SLOWVT: 32
TZON-0004VSLOWVT: 20
TZON-0005SLOWVT: 12
TZST-0001ATACH: 4
TZST-0001ATACH: 5
TZST-0001ATACH: 6
TZST-0001FASTVT: 2
TZST-0001FASTVT: 3
TZST-0001FASTVT: 4
TZST-0001FASTVT: 5
TZST-0001FASTVT: 6
TZST-0001SLOWVT: 3
TZST-0001SLOWVT: 4
TZST-0001SLOWVT: 5
TZST-0001SLOWVT: 6
TZST-0002ATACH: NEGATIVE
TZST-0002ATACH: NEGATIVE
TZST-0002ATACH: NEGATIVE
TZST-0002FASTVT: NEGATIVE
TZST-0002FASTVT: NEGATIVE
TZST-0002FASTVT: NEGATIVE
TZST-0002FASTVT: NEGATIVE
TZST-0002FASTVT: NEGATIVE
TZST-0003SLOWVT: 15 J
TZST-0003SLOWVT: 28 J
TZST-0003SLOWVT: 35 J
TZST-0003SLOWVT: 35 J
VENTRICULAR PACING ICD: 70.43 pct
VF: 0

## 2012-08-06 ENCOUNTER — Ambulatory Visit (INDEPENDENT_AMBULATORY_CARE_PROVIDER_SITE_OTHER): Payer: Medicare Other

## 2012-08-06 DIAGNOSIS — I4892 Unspecified atrial flutter: Secondary | ICD-10-CM

## 2012-08-06 DIAGNOSIS — Z7901 Long term (current) use of anticoagulants: Secondary | ICD-10-CM | POA: Diagnosis not present

## 2012-08-06 LAB — POCT INR: INR: 2.8

## 2012-08-11 DIAGNOSIS — J320 Chronic maxillary sinusitis: Secondary | ICD-10-CM | POA: Diagnosis not present

## 2012-08-11 DIAGNOSIS — B49 Unspecified mycosis: Secondary | ICD-10-CM | POA: Diagnosis not present

## 2012-08-11 DIAGNOSIS — J322 Chronic ethmoidal sinusitis: Secondary | ICD-10-CM | POA: Diagnosis not present

## 2012-08-14 ENCOUNTER — Ambulatory Visit (INDEPENDENT_AMBULATORY_CARE_PROVIDER_SITE_OTHER): Payer: Medicare Other | Admitting: Cardiology

## 2012-08-14 ENCOUNTER — Encounter: Payer: Self-pay | Admitting: Cardiology

## 2012-08-14 VITALS — BP 107/66 | HR 73 | Ht 67.0 in | Wt 148.6 lb

## 2012-08-14 DIAGNOSIS — I429 Cardiomyopathy, unspecified: Secondary | ICD-10-CM | POA: Diagnosis not present

## 2012-08-14 DIAGNOSIS — Z95810 Presence of automatic (implantable) cardiac defibrillator: Secondary | ICD-10-CM | POA: Diagnosis not present

## 2012-08-14 DIAGNOSIS — I447 Left bundle-branch block, unspecified: Secondary | ICD-10-CM

## 2012-08-14 DIAGNOSIS — I5022 Chronic systolic (congestive) heart failure: Secondary | ICD-10-CM

## 2012-08-14 DIAGNOSIS — I4892 Unspecified atrial flutter: Secondary | ICD-10-CM

## 2012-08-14 NOTE — Patient Instructions (Addendum)
The current medical regimen is effective;  continue present plan and medications.  Follow up in 3 months with Dr Hochrein. 

## 2012-08-14 NOTE — Progress Notes (Signed)
HPI [name] presents for follow up of his cardiomyopathy and atrial flutter.  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 He's not describing any PND or orthopnea. He's had no weight gain or edema. He's had no chest pressure, neck or arm discomfort.  Allergies  Allergen Reactions  . Clarithromycin Nausea Only  . Famotidine Other (See Comments)    ABD. PAIN  . Omeprazole Diarrhea  . Oxytetracycline     REACTION: BUMPS  . Penicillins     REACTION: SWELLING Amoxicillin ok  . Proton Pump Inhibitors Other (See Comments)    GI upset, diarrhea, gas, bloating  . Ranitidine Diarrhea    Current Outpatient Prescriptions  Medication Sig Dispense Refill  . carvedilol (COREG) 6.25 MG tablet Take 1 tablet (6.25 mg total) by mouth 2 (two) times daily with a meal.  180 tablet  3  . diazepam (VALIUM) 5 MG tablet 1/4 tab at bedtime as needed  30 tablet  0  . enalapril (VASOTEC) 5 MG tablet Take 1 tablet (5 mg total) by mouth 2 (two) times daily.  180 tablet  3  . flunisolide (NASAREL) 29 MCG/ACT (0.025%) nasal spray 2 sprays by Nasal route daily. Dose is for each nostril.       . Fluticasone-Salmeterol (ADVAIR DISKUS) 100-50 MCG/DOSE AEPB Inhale 1 puff into the lungs as needed.       . furosemide (LASIX) 20 MG tablet Take 10 mg by mouth 2 (two) times daily.      . Loratadine 10 MG CAPS Take 1 capsule by mouth as needed.       . montelukast (SINGULAIR) 10 MG tablet Take 10 mg by mouth daily as needed.        . sucralfate (CARAFATE) 1 G tablet TAKE 1/2 A TABLET BY MOUTH AS NEEDED  30 tablet  3  . warfarin (COUMADIN) 5 MG tablet Take as directed by the anticoagulation clinic  40 tablet  1   No current facility-administered medications for this visit.    Past Medical History  Diagnosis Date  . NICM (nonischemic cardiomyopathy)     Cardiac catheterization March 2006 without coronary disease; echocardiogram August 2008: EF 35%, mild AI, left atrial  enlargement  . Atrial flutter     A.  Status post cardioversion; B.  Tikosyn therapy - failed, remains in aflutte  . COPD (chronic obstructive pulmonary disease)   . LBBB (left bundle branch block)   . GERD (gastroesophageal reflux disease) 2010    h/o esophageal stricture with dilation, LA grade C reflux esophagitis by EGD 2010  . Porphyria   . IBS (irritable bowel syndrome)   . BPH (benign prostatic hyperplasia)   . History of diverticulitis of colon   . Allergic rhinitis     [name]  . Chronic systolic congestive heart failure   . History of GI bleed     Secondary to hemorrhoids  . Extrinsic asthma     [name]  . 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 Wyn Quaker)    Past Surgical History  Procedure Laterality Date  . Penile prosthesis implant  2007    Otelin  . Cholecystectomy    . Goiter removal    . Nose surgery  1971  . Hernia repair    . Cardiac catheterization  06/22/04    Severe, nonischemic cardiomyopathy,EF 25-30%  . Biv-aicd implant      Medtronic  .  Colonoscopy  10/99    Divertic, splenic, hepatic fleure only  . Esophagogastroduodenoscopy  01/1998    stricture, sliding HH, GERD  . Esophageal dilation  02/18/98  . Esophagogastroduodenoscopy  04/08/01    stricture, gastritis, HH, GERD-no dilation(Dr. Marina Goodell)  . Hernia repair  01/30/02    Dr. Daphine Deutscher  . Pulmonary eval  04/2002    ([facility]) Chronic congestive symptoms  . US echocardiography  07/13/04    EF 25-30%, Mod LVH; LA severe dilation; Mild AR; IRTR  . Esophagogastroduodenoscopy  12/22/04    stricture; gastritis; duodenitis, GERD  . US echocardiography  11/27/06    hypokinesis posterior wall, EF 35%; mild AR  . Esophagogastroduodenoscopy  10/21/08    Reflux esophagitis; Erythem. Duod. (Dr. Mechele Collin)  . Colonoscopy  10/21/08    aborted-divertics, int hemms (Dr. Mechele Collin)  . Cardioversion  02/22/09    AFlutter-([facility])  . Coronary angioplasty    . Back surgery  1997    bulging disks    ROS:  As  stated in the HPI and negative for all other systems.  PHYSICAL EXAM BP 107/66  Pulse 73  Ht 5\' 7"  (1.702 m)  Wt 148 lb 9.6 oz (67.405 kg)  BMI 23.27 kg/m2 GENERAL:  Well appearing NECK:  No jugular venous distention, waveform within normal limits, carotid upstroke brisk and symmetric, no bruits, no thyromegaly LUNGS:  Clear to auscultation bilaterally CHEST:  Well healed ICD pocket HEART:  PMI not displaced or sustained,S1 and S2 within normal limits, no S3, no clicks, no rubs, no murmurs ABD:  Flat, positive bowel sounds normal in frequency in pitch, no bruits, no rebound, no guarding, no midline pulsatile mass, no hepatomegaly, no splenomegaly EXT:  2 plus pulses throughout, no edema, no cyanosis no clubbing   EKG:  Atrial flutter, demand ventricular pacemaker rate 64.  08/14/2012  ASSESSMENT AND PLAN  Nonischemic cardiomyopathy -  He seems to be euvolemic. At this point, no change in therapy is indicated. We have reviewed salt and fluid restrictions. No further cardiovascular testing is indicated.   Atrial flutter -  The patient tolerates this rhythm and rate control and anticoagulation. We will continue with the meds as listed. He does not want to switch from warfarin.

## 2012-08-19 ENCOUNTER — Other Ambulatory Visit: Payer: Self-pay

## 2012-08-19 DIAGNOSIS — I5022 Chronic systolic (congestive) heart failure: Secondary | ICD-10-CM

## 2012-08-19 MED ORDER — FUROSEMIDE 20 MG PO TABS: 10.0000 mg | ORAL_TABLET | Freq: Two times a day (BID) | ORAL | Status: DC

## 2012-08-19 MED ORDER — ENALAPRIL MALEATE 5 MG PO TABS: 5.0000 mg | ORAL_TABLET | Freq: Two times a day (BID) | ORAL | Status: DC

## 2012-08-19 NOTE — Telephone Encounter (Signed)
..   Requested Prescriptions   Signed Prescriptions Disp Refills  . enalapril (VASOTEC) 5 MG tablet 180 tablet 3    Sig: Take 1 tablet (5 mg total) by mouth 2 (two) times daily.    Authorizing Provider: Rollene Rotunda    Ordering User: Christella Hartigan, Chan Rosasco Judie Petit

## 2012-08-19 NOTE — Telephone Encounter (Signed)
..   Requested Prescriptions   Signed Prescriptions Disp Refills  . furosemide (LASIX) 20 MG tablet 30 tablet 11    Sig: Take 0.5 tablets (10 mg total) by mouth 2 (two) times daily.    Authorizing Provider: Rollene Rotunda    Ordering User: Christella Hartigan, Dallyn Bergland Judie Petit

## 2012-08-28 ENCOUNTER — Encounter: Payer: Self-pay | Admitting: *Deleted

## 2012-08-28 ENCOUNTER — Ambulatory Visit: Payer: Medicare Other | Admitting: Cardiology

## 2012-09-10 ENCOUNTER — Ambulatory Visit (INDEPENDENT_AMBULATORY_CARE_PROVIDER_SITE_OTHER): Payer: Medicare Other

## 2012-09-10 DIAGNOSIS — I4892 Unspecified atrial flutter: Secondary | ICD-10-CM | POA: Diagnosis not present

## 2012-09-10 DIAGNOSIS — Z7901 Long term (current) use of anticoagulants: Secondary | ICD-10-CM

## 2012-09-10 LAB — POCT INR: INR: 2.8

## 2012-09-12 ENCOUNTER — Other Ambulatory Visit: Payer: Self-pay | Admitting: Family Medicine

## 2012-09-12 DIAGNOSIS — J018 Other acute sinusitis: Secondary | ICD-10-CM | POA: Diagnosis not present

## 2012-09-12 DIAGNOSIS — J301 Allergic rhinitis due to pollen: Secondary | ICD-10-CM | POA: Diagnosis not present

## 2012-09-12 DIAGNOSIS — J328 Other chronic sinusitis: Secondary | ICD-10-CM | POA: Diagnosis not present

## 2012-09-12 NOTE — Telephone Encounter (Signed)
plz phone in. 

## 2012-09-12 NOTE — Telephone Encounter (Signed)
Ok to refill 

## 2012-09-15 NOTE — Telephone Encounter (Signed)
Rx called in as directed.   

## 2012-09-26 DIAGNOSIS — J301 Allergic rhinitis due to pollen: Secondary | ICD-10-CM | POA: Diagnosis not present

## 2012-09-26 DIAGNOSIS — J328 Other chronic sinusitis: Secondary | ICD-10-CM | POA: Diagnosis not present

## 2012-10-08 ENCOUNTER — Ambulatory Visit (INDEPENDENT_AMBULATORY_CARE_PROVIDER_SITE_OTHER): Payer: Medicare Other | Admitting: Family Medicine

## 2012-10-08 ENCOUNTER — Telehealth: Payer: Self-pay | Admitting: Family Medicine

## 2012-10-08 ENCOUNTER — Encounter: Payer: Self-pay | Admitting: Family Medicine

## 2012-10-08 VITALS — BP 108/68 | HR 72 | Temp 98.2°F | Wt 145.5 lb

## 2012-10-08 DIAGNOSIS — L0390 Cellulitis, unspecified: Secondary | ICD-10-CM

## 2012-10-08 DIAGNOSIS — L0291 Cutaneous abscess, unspecified: Secondary | ICD-10-CM | POA: Diagnosis not present

## 2012-10-08 MED ORDER — SULFAMETHOXAZOLE-TRIMETHOPRIM 800-160 MG PO TABS: 1.0000 | ORAL_TABLET | Freq: Two times a day (BID) | ORAL | Status: DC

## 2012-10-08 NOTE — Telephone Encounter (Signed)
Patient Information:  Caller Name: Ector  Phone: [phone]  Patient: [name]  Gender: Male  DOB: 09/20/1935  Age: 79 Years  PCP: Eustaquio Boyden [facility])  Office Follow Up:  Does the office need to follow up with this patient?: Yes  Instructions For The Office: Please call back ASAP to advise about work in appointment for  10/08/12  RN Note:  Felt chilled during the night.  Afebrile now. Advised to see MD within 4 hours due to nursing judgement.  Symptoms  Reason For Call & Symptoms: Swelling of left upper arm to elbow with redness after tick bite.  Tick broke off during removal.  Removed all residual tick "by digging it out."   Entire arm itches.  Reviewed Health History In EMR: Yes  Reviewed Medications In EMR: Yes  Reviewed Allergies In EMR: Yes  Reviewed Surgeries / Procedures: Yes  Date of Onset of Symptoms: 10/04/2012  Treatments Tried: ice pack to relieve itching  Treatments Tried Worked: Yes  Guideline(s) Used:  Tick Bite  Disposition Per Guideline:   See Today in Office  Reason For Disposition Reached:   Patient wants to be seen  Advice Given:  Antibiotic Ointment:  Wash the wound and your hands with soap and water after removal to prevent catching any tick disease. Apply an over-the-counter antibiotic ointment (e.g., bacitracin) to the bite once.  Expected Course:  Tick bites normally do not itch or hurt. That is why they often go unnoticed.  Call Back If:  Bite begins to look infected  You become worse.  Patient Will Follow Care Advice:  YES

## 2012-10-08 NOTE — Patient Instructions (Addendum)
Take septra twice a day.  Take 1/2 a pill of coumadin tonight and call the coumadin clinic tomorrow morning.   Recheck with Dr. Reece Agar early next week.  If worsening over the weekend (increase in pain, a fever, spreading redness), then you'll need to be rechecked sooner.  Take care.

## 2012-10-08 NOTE — Assessment & Plan Note (Signed)
Appears to have cellulitis with incidental tick bite, ie the erythema appears to be from the skin disruption due to the bite, but not necessarily tick associated itself.  Would likely not tolerate doxy, would use septra.  Cut coumadin back tonight and he'll notify the coumadin clinic tomorrow per routine.  See f/u instructions.  Nontoxic. Okay for outpatient f/u.

## 2012-10-08 NOTE — Telephone Encounter (Signed)
Dr Para March can see pt today at 4:15 pm. Advised pt if condition changes or worsens to go to UC if needed. Pt voiced understanding.

## 2012-10-08 NOTE — Telephone Encounter (Signed)
Noted! Thank you

## 2012-10-08 NOTE — Progress Notes (Signed)
Tick bite.  L arm.  It didn't get engorged.  He tried to get it out Saturday.  In the interval, the L posterior arm got red and bruised, puffy, tracking down to the L arm to the elbow.  The red area itches. No fevers, but had some chills last night.  No sx other than on the L arm.  On coumadin.    Meds, vitals, and allergies reviewed.   ROS: See HPI.  Otherwise, noncontributory.  nad ncat Mmm Sounds to be rrr ctab abd soft L shoulder elbow and wrist with normal ROM Tick bite site is not irritated No fluctuant mass but diffuse erythema on the posterior side of the distal upper arm with bruising noted just proximal to the elbow Distally NV intact

## 2012-10-13 ENCOUNTER — Ambulatory Visit (INDEPENDENT_AMBULATORY_CARE_PROVIDER_SITE_OTHER): Payer: Medicare Other

## 2012-10-13 DIAGNOSIS — Z7901 Long term (current) use of anticoagulants: Secondary | ICD-10-CM

## 2012-10-13 DIAGNOSIS — I4892 Unspecified atrial flutter: Secondary | ICD-10-CM

## 2012-10-13 LAB — POCT INR: INR: 1.2

## 2012-10-22 ENCOUNTER — Ambulatory Visit (INDEPENDENT_AMBULATORY_CARE_PROVIDER_SITE_OTHER): Payer: Medicare Other | Admitting: *Deleted

## 2012-10-22 DIAGNOSIS — I4892 Unspecified atrial flutter: Secondary | ICD-10-CM | POA: Diagnosis not present

## 2012-10-22 DIAGNOSIS — Z7901 Long term (current) use of anticoagulants: Secondary | ICD-10-CM

## 2012-10-22 LAB — POCT INR: INR: 1.9

## 2012-10-23 DIAGNOSIS — J45909 Unspecified asthma, uncomplicated: Secondary | ICD-10-CM | POA: Diagnosis not present

## 2012-10-23 DIAGNOSIS — H1045 Other chronic allergic conjunctivitis: Secondary | ICD-10-CM | POA: Diagnosis not present

## 2012-10-23 DIAGNOSIS — K219 Gastro-esophageal reflux disease without esophagitis: Secondary | ICD-10-CM | POA: Diagnosis not present

## 2012-10-23 DIAGNOSIS — J309 Allergic rhinitis, unspecified: Secondary | ICD-10-CM | POA: Diagnosis not present

## 2012-11-03 ENCOUNTER — Encounter: Payer: Self-pay | Admitting: Internal Medicine

## 2012-11-03 ENCOUNTER — Ambulatory Visit (INDEPENDENT_AMBULATORY_CARE_PROVIDER_SITE_OTHER): Payer: Medicare Other | Admitting: *Deleted

## 2012-11-03 DIAGNOSIS — I5022 Chronic systolic (congestive) heart failure: Secondary | ICD-10-CM

## 2012-11-03 DIAGNOSIS — Z95810 Presence of automatic (implantable) cardiac defibrillator: Secondary | ICD-10-CM

## 2012-11-03 DIAGNOSIS — I429 Cardiomyopathy, unspecified: Secondary | ICD-10-CM

## 2012-11-05 ENCOUNTER — Ambulatory Visit (INDEPENDENT_AMBULATORY_CARE_PROVIDER_SITE_OTHER): Payer: Medicare Other | Admitting: *Deleted

## 2012-11-05 DIAGNOSIS — I4892 Unspecified atrial flutter: Secondary | ICD-10-CM | POA: Diagnosis not present

## 2012-11-05 DIAGNOSIS — Z7901 Long term (current) use of anticoagulants: Secondary | ICD-10-CM

## 2012-11-05 LAB — POCT INR: INR: 2.1

## 2012-11-10 ENCOUNTER — Other Ambulatory Visit: Payer: Self-pay

## 2012-11-10 DIAGNOSIS — I5022 Chronic systolic (congestive) heart failure: Secondary | ICD-10-CM

## 2012-11-10 MED ORDER — CARVEDILOL 6.25 MG PO TABS: 6.2500 mg | ORAL_TABLET | Freq: Two times a day (BID) | ORAL | Status: DC

## 2012-11-11 ENCOUNTER — Other Ambulatory Visit: Payer: Self-pay | Admitting: Pharmacist

## 2012-11-11 DIAGNOSIS — I5022 Chronic systolic (congestive) heart failure: Secondary | ICD-10-CM

## 2012-11-11 LAB — REMOTE ICD DEVICE
AL AMPLITUDE: 5.6 mv
AL IMPEDENCE ICD: 418 Ohm
AL THRESHOLD: 0.5 V
ATRIAL PACING ICD: 0 pct
BAMS-0001: 170 {beats}/min
BATTERY VOLTAGE: 3.0537 V
CHARGE TIME: 9.889 s
FVT: 0
LV LEAD IMPEDENCE ICD: 418 Ohm
LV LEAD THRESHOLD: 0.875 V
PACEART VT: 0
RV LEAD AMPLITUDE: 15.4 mv
RV LEAD IMPEDENCE ICD: 494 Ohm
RV LEAD THRESHOLD: 0.875 V
TOT-0001: 1
TOT-0002: 0
TOT-0006: 20120416000000
TZAT-0001ATACH: 1
TZAT-0001ATACH: 2
TZAT-0001ATACH: 3
TZAT-0001FASTVT: 1
TZAT-0001SLOWVT: 1
TZAT-0001SLOWVT: 2
TZAT-0002ATACH: NEGATIVE
TZAT-0002ATACH: NEGATIVE
TZAT-0002ATACH: NEGATIVE
TZAT-0002FASTVT: NEGATIVE
TZAT-0004SLOWVT: 6
TZAT-0004SLOWVT: 8
TZAT-0005SLOWVT: 84 pct
TZAT-0005SLOWVT: 91 pct
TZAT-0011SLOWVT: 10 ms
TZAT-0011SLOWVT: 10 ms
TZAT-0012ATACH: 150 ms
TZAT-0012ATACH: 150 ms
TZAT-0012ATACH: 150 ms
TZAT-0012FASTVT: 200 ms
TZAT-0012SLOWVT: 200 ms
TZAT-0012SLOWVT: 200 ms
TZAT-0013SLOWVT: 2
TZAT-0013SLOWVT: 2
TZAT-0018ATACH: NEGATIVE
TZAT-0018ATACH: NEGATIVE
TZAT-0018ATACH: NEGATIVE
TZAT-0018FASTVT: NEGATIVE
TZAT-0018SLOWVT: NEGATIVE
TZAT-0018SLOWVT: NEGATIVE
TZAT-0019ATACH: 6 V
TZAT-0019ATACH: 6 V
TZAT-0019ATACH: 6 V
TZAT-0019FASTVT: 8 V
TZAT-0019SLOWVT: 8 V
TZAT-0019SLOWVT: 8 V
TZAT-0020ATACH: 1.5 ms
TZAT-0020ATACH: 1.5 ms
TZAT-0020ATACH: 1.5 ms
TZAT-0020FASTVT: 1.5 ms
TZAT-0020SLOWVT: 1.5 ms
TZAT-0020SLOWVT: 1.5 ms
TZON-0003ATACH: 350 ms
TZON-0003SLOWVT: 340 ms
TZON-0003VSLOWVT: 450 ms
TZON-0004SLOWVT: 32
TZON-0004VSLOWVT: 20
TZON-0005SLOWVT: 12
TZST-0001ATACH: 4
TZST-0001ATACH: 5
TZST-0001ATACH: 6
TZST-0001FASTVT: 2
TZST-0001FASTVT: 3
TZST-0001FASTVT: 4
TZST-0001FASTVT: 5
TZST-0001FASTVT: 6
TZST-0001SLOWVT: 3
TZST-0001SLOWVT: 4
TZST-0001SLOWVT: 5
TZST-0001SLOWVT: 6
TZST-0002ATACH: NEGATIVE
TZST-0002ATACH: NEGATIVE
TZST-0002ATACH: NEGATIVE
TZST-0002FASTVT: NEGATIVE
TZST-0002FASTVT: NEGATIVE
TZST-0002FASTVT: NEGATIVE
TZST-0002FASTVT: NEGATIVE
TZST-0002FASTVT: NEGATIVE
TZST-0003SLOWVT: 15 J
TZST-0003SLOWVT: 28 J
TZST-0003SLOWVT: 35 J
TZST-0003SLOWVT: 35 J
VENTRICULAR PACING ICD: 85.17 pct
VF: 0

## 2012-11-11 MED ORDER — CARVEDILOL 6.25 MG PO TABS: 6.2500 mg | ORAL_TABLET | Freq: Two times a day (BID) | ORAL | Status: DC

## 2012-11-21 ENCOUNTER — Ambulatory Visit (INDEPENDENT_AMBULATORY_CARE_PROVIDER_SITE_OTHER): Payer: Medicare Other | Admitting: Cardiology

## 2012-11-21 ENCOUNTER — Ambulatory Visit: Payer: Medicare Other | Admitting: Cardiology

## 2012-11-21 ENCOUNTER — Encounter: Payer: Self-pay | Admitting: Cardiology

## 2012-11-21 VITALS — BP 116/68 | HR 64 | Ht 66.0 in | Wt 146.8 lb

## 2012-11-21 DIAGNOSIS — I5022 Chronic systolic (congestive) heart failure: Secondary | ICD-10-CM

## 2012-11-21 DIAGNOSIS — I4892 Unspecified atrial flutter: Secondary | ICD-10-CM | POA: Diagnosis not present

## 2012-11-21 NOTE — Patient Instructions (Addendum)
The current medical regimen is effective;  continue present plan and medications.  Follow up in 4 months with Dr Hochrein.  You will receive a letter in the mail 2 months before you are due.  Please call us when you receive this letter to schedule your follow up appointment.  

## 2012-11-21 NOTE — Progress Notes (Signed)
HPI [name] presents for follow up of his cardiomyopathy and atrial flutter.  He has done well. He remains active farming.  The patient denies any new symptoms such as chest discomfort, neck or arm discomfort. There has been no new shortness of breath, PND or orthopnea. There have been no reported palpitations, presyncope or syncope.   Allergies  Allergen Reactions  . Clarithromycin Nausea Only  . Famotidine Other (See Comments)    ABD. PAIN  . Omeprazole Diarrhea  . Oxytetracycline     REACTION: BUMPS  . Penicillins     REACTION: SWELLING Amoxicillin ok  . Proton Pump Inhibitors Other (See Comments)    GI upset, diarrhea, gas, bloating  . Ranitidine Diarrhea    Current Outpatient Prescriptions  Medication Sig Dispense Refill  . carvedilol (COREG) 6.25 MG tablet Take 1 tablet (6.25 mg total) by mouth 2 (two) times daily with a meal.  180 tablet  1  . diazepam (VALIUM) 5 MG tablet TAKE 1/4 TABLET EVERY EVENING AT BEDTIME AS NEEDED  30 tablet  1  . enalapril (VASOTEC) 5 MG tablet Take 1 tablet (5 mg total) by mouth 2 (two) times daily.  180 tablet  3  . flunisolide (NASAREL) 29 MCG/ACT (0.025%) nasal spray 2 sprays by Nasal route daily. Dose is for each nostril.       . Fluticasone-Salmeterol (ADVAIR DISKUS) 100-50 MCG/DOSE AEPB Inhale 1 puff into the lungs as needed.       . furosemide (LASIX) 20 MG tablet Take 10 mg by mouth daily.      . Loratadine 10 MG CAPS Take 1 capsule by mouth as needed.       . montelukast (SINGULAIR) 10 MG tablet Take 10 mg by mouth daily as needed.        . sucralfate (CARAFATE) 1 G tablet TAKE 1/2 A TABLET BY MOUTH AS NEEDED  30 tablet  3  . warfarin (COUMADIN) 5 MG tablet Take as directed by the anticoagulation clinic  40 tablet  1   No current facility-administered medications for this visit.    Past Medical History  Diagnosis Date  . NICM (nonischemic cardiomyopathy)     Cardiac catheterization March 2006 without coronary  disease; echocardiogram August 2008: EF 35%, mild AI, left atrial enlargement  . Atrial flutter     A.  Status post cardioversion; B.  Tikosyn therapy - failed, remains in aflutte  . COPD (chronic obstructive pulmonary disease)   . LBBB (left bundle branch block)   . GERD (gastroesophageal reflux disease) 2010    h/o esophageal stricture with dilation, LA grade C reflux esophagitis by EGD 2010  . Porphyria   . IBS (irritable bowel syndrome)   . BPH (benign prostatic hyperplasia)   . History of diverticulitis of colon   . Allergic rhinitis     [name]  . Chronic systolic congestive heart failure   . History of GI bleed     Secondary to hemorrhoids  . Extrinsic asthma     [name]  . 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 Wyn Quaker)    Past Surgical History  Procedure Laterality Date  . Penile prosthesis implant  2007    Otelin  . Cholecystectomy    . Goiter removal    . Nose surgery  1971  . Hernia repair    . Cardiac catheterization  06/22/04    Severe, nonischemic cardiomyopathy,EF 25-30%  . Biv-aicd implant  [employer]  . Colonoscopy  10/99    Divertic, splenic, hepatic fleure only  . Esophagogastroduodenoscopy  01/1998    stricture, sliding HH, GERD  . Esophageal dilation  02/18/98  . Esophagogastroduodenoscopy  04/08/01    stricture, gastritis, HH, GERD-no dilation(Dr. Marina Goodell)  . Hernia repair  01/30/02    Dr. Daphine Deutscher  . Pulmonary eval  04/2002    ([facility]) Chronic congestive symptoms  . US echocardiography  07/13/04    EF 25-30%, Mod LVH; LA severe dilation; Mild AR; IRTR  . Esophagogastroduodenoscopy  12/22/04    stricture; gastritis; duodenitis, GERD  . US echocardiography  11/27/06    hypokinesis posterior wall, EF 35%; mild AR  . Esophagogastroduodenoscopy  10/21/08    Reflux esophagitis; Erythem. Duod. (Dr. Mechele Collin)  . Colonoscopy  10/21/08    aborted-divertics, int hemms (Dr. Mechele Collin)  . Cardioversion  02/22/09    AFlutter-([facility])  . Coronary  angioplasty    . Back surgery  1997    bulging disks    ROS:  As stated in the HPI and negative for all other systems.  PHYSICAL EXAM BP 116/68  Pulse 64  Ht 5\' 6"  (1.676 m)  Wt 146 lb 12.8 oz (66.588 kg)  BMI 23.71 kg/m2 GENERAL:  Well appearing NECK:  No jugular venous distention, waveform within normal limits, carotid upstroke brisk and symmetric, no bruits, no thyromegaly LUNGS:  Clear to auscultation bilaterally CHEST:  Well healed ICD pocket HEART:  PMI not displaced or sustained,S1 and S2 within normal limits, no S3, no clicks, no rubs, no murmurs ABD:  Flat, positive bowel sounds normal in frequency in pitch, no bruits, no rebound, no guarding, no midline pulsatile mass, no hepatomegaly, no splenomegaly EXT:  2 plus pulses throughout, no edema, no cyanosis no clubbing   EKG:  Atrial flutter, demand ventricular pacemaker rate 66.  11/21/2012  ASSESSMENT AND PLAN  Nonischemic cardiomyopathy -  He seems to be euvolemic. At this point, no change in therapy is indicated. . No further cardiovascular testing is indicated.   Atrial flutter -  The patient tolerates this rhythm and rate control and anticoagulation. We will continue with the meds as listed. He does not want to switch from warfarin.

## 2012-12-03 ENCOUNTER — Ambulatory Visit (INDEPENDENT_AMBULATORY_CARE_PROVIDER_SITE_OTHER): Payer: Medicare Other

## 2012-12-03 DIAGNOSIS — I4892 Unspecified atrial flutter: Secondary | ICD-10-CM | POA: Diagnosis not present

## 2012-12-03 DIAGNOSIS — Z7901 Long term (current) use of anticoagulants: Secondary | ICD-10-CM

## 2012-12-03 LAB — POCT INR: INR: 2.8

## 2013-01-07 ENCOUNTER — Ambulatory Visit (INDEPENDENT_AMBULATORY_CARE_PROVIDER_SITE_OTHER): Payer: Medicare Other | Admitting: General Practice

## 2013-01-07 DIAGNOSIS — Z7901 Long term (current) use of anticoagulants: Secondary | ICD-10-CM

## 2013-01-07 DIAGNOSIS — I4892 Unspecified atrial flutter: Secondary | ICD-10-CM

## 2013-01-07 LAB — POCT INR: INR: 2.6

## 2013-01-07 MED ORDER — WARFARIN SODIUM 5 MG PO TABS: ORAL_TABLET | ORAL | Status: DC

## 2013-01-20 ENCOUNTER — Encounter: Payer: Self-pay | Admitting: Family Medicine

## 2013-01-20 ENCOUNTER — Ambulatory Visit (INDEPENDENT_AMBULATORY_CARE_PROVIDER_SITE_OTHER): Payer: Medicare Other | Admitting: Family Medicine

## 2013-01-20 ENCOUNTER — Ambulatory Visit (INDEPENDENT_AMBULATORY_CARE_PROVIDER_SITE_OTHER)
Admission: RE | Admit: 2013-01-20 | Discharge: 2013-01-20 | Disposition: A | Discharge status: Home / Self Care | Payer: Medicare Other | Source: Ambulatory Visit | Attending: Family Medicine | Admitting: Family Medicine

## 2013-01-20 VITALS — BP 118/82 | HR 68 | Temp 98.0°F | Wt 146.5 lb

## 2013-01-20 DIAGNOSIS — M25519 Pain in unspecified shoulder: Secondary | ICD-10-CM | POA: Diagnosis not present

## 2013-01-20 DIAGNOSIS — M25569 Pain in unspecified knee: Secondary | ICD-10-CM | POA: Diagnosis not present

## 2013-01-20 DIAGNOSIS — M7052 Other bursitis of knee, left knee: Secondary | ICD-10-CM | POA: Insufficient documentation

## 2013-01-20 DIAGNOSIS — S99919A Unspecified injury of unspecified ankle, initial encounter: Secondary | ICD-10-CM | POA: Diagnosis not present

## 2013-01-20 DIAGNOSIS — M76899 Other specified enthesopathies of unspecified lower limb, excluding foot: Secondary | ICD-10-CM

## 2013-01-20 DIAGNOSIS — M25562 Pain in left knee: Secondary | ICD-10-CM

## 2013-01-20 DIAGNOSIS — M25511 Pain in right shoulder: Secondary | ICD-10-CM

## 2013-01-20 DIAGNOSIS — S8990XA Unspecified injury of unspecified lower leg, initial encounter: Secondary | ICD-10-CM | POA: Diagnosis not present

## 2013-01-20 NOTE — Progress Notes (Signed)
  Subjective:    Patient ID: [name], male    DOB: 09-Jun-1931, 79 y.o.   MRN: [id number]  HPI CC: L knee pain  2 mo ago fell down while walking in [employer].  Hit left elbow, L knee, and fell on R shoulder.  Persistent pain at L knee and R shoulder. Treated with ice.  Has not tried meds for this. Persistent swelling at knee - painful with walking.  Swelling seems to be increasing.  No redness or warmth. R shoulder with limited movement 2/2 pain but seems to be improving daily.  Declines flu shot today.    Past Medical History  Diagnosis Date  . NICM (nonischemic cardiomyopathy)     Cardiac catheterization March 2006 without coronary disease; echocardiogram August 2008: EF 35%, mild AI, left atrial enlargement  . Atrial flutter     A.  Status post cardioversion; B.  Tikosyn therapy - failed, remains in aflutte  . COPD (chronic obstructive pulmonary disease)   . LBBB (left bundle branch block)   . GERD (gastroesophageal reflux disease) 2010    h/o esophageal stricture with dilation, LA grade C reflux esophagitis by EGD 2010  . Porphyria   . IBS (irritable bowel syndrome)   . BPH (benign prostatic hyperplasia)   . History of diverticulitis of colon   . Allergic rhinitis     [name]  . Chronic systolic congestive heart failure   . History of GI bleed     Secondary to hemorrhoids  . Extrinsic asthma     [name]  . 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 Wyn Quaker)     Review of Systems Per HPI    Objective:   Physical Exam  Nursing note and vitals reviewed. Constitutional: He appears well-developed and well-nourished. No distress.  Musculoskeletal: He exhibits no edema.  L shoulder WNL. R anterior shoulder tender to palpation. Tender with int/ext rotation of shoulder.  Pain with rotation of humeral head in [facility] joint.  Unable to perform R sided supraspinatus 2/2 pain.  Tender with speed test.  R knee WNL. L knee - swelling present anterior and  superior to patella. Painful at patella.  No pain with palpation of lateral/medial knee.  FROM in flexion and extension but pain at full flexion.       Assessment & Plan:

## 2013-01-20 NOTE — Patient Instructions (Signed)
xrays looking ok. We drained the bursa of your left knee today. Keep bandage on for next day. Let me know if worsening pain, swelling or redness Call us with questions. If reaccumulates, let me know.

## 2013-01-21 ENCOUNTER — Encounter: Payer: Self-pay | Admitting: Family Medicine

## 2013-01-21 NOTE — Assessment & Plan Note (Signed)
Anticipate RTC injury - as improving on its own, will continue to monitor for now.  rec ice, tylenol.  Update if not improving as expected

## 2013-01-21 NOTE — Assessment & Plan Note (Addendum)
Traumatic suprapatellar bursitis after direct fall on knee present for last 2 months. IC obtained and in chart. Discussed risk of bleed after procedure as on coumadin, but we regardless decided to proceed with bursa aspiration as not improving.  Area cleaned with betadine then alcohol pad.  Skin numbed with 1/2 cc 1% lidocaine, then using 21g needle 1/2 cc of serosanguinous fluid and 1 cc air drained from suprapatellar bursa.  Improved swelling at area immediately noted.  Compression bandage placed.  Pt tolerated procedure well. Fluid not sent for analysis.

## 2013-01-22 ENCOUNTER — Encounter: Payer: Self-pay | Admitting: Internal Medicine

## 2013-01-22 ENCOUNTER — Ambulatory Visit (INDEPENDENT_AMBULATORY_CARE_PROVIDER_SITE_OTHER): Payer: Medicare Other | Admitting: Internal Medicine

## 2013-01-22 VITALS — BP 122/78 | HR 76 | Ht 67.0 in | Wt 146.1 lb

## 2013-01-22 DIAGNOSIS — I429 Cardiomyopathy, unspecified: Secondary | ICD-10-CM | POA: Diagnosis not present

## 2013-01-22 DIAGNOSIS — I5022 Chronic systolic (congestive) heart failure: Secondary | ICD-10-CM

## 2013-01-22 DIAGNOSIS — Z95810 Presence of automatic (implantable) cardiac defibrillator: Secondary | ICD-10-CM | POA: Diagnosis not present

## 2013-01-22 DIAGNOSIS — I4892 Unspecified atrial flutter: Secondary | ICD-10-CM | POA: Diagnosis not present

## 2013-01-22 LAB — ICD DEVICE OBSERVATION
AL AMPLITUDE: 8.375 mv
AL IMPEDENCE ICD: 475 Ohm
AL THRESHOLD: 0.5 V
ATRIAL PACING ICD: 0 pct
BAMS-0001: 170 {beats}/min
BATTERY VOLTAGE: 3.0332 V
CHARGE TIME: 9.889 s
FVT: 0
LV LEAD IMPEDENCE ICD: 418 Ohm
LV LEAD THRESHOLD: 0.75 V
PACEART VT: 0
RV LEAD AMPLITUDE: 14.875 mv
RV LEAD IMPEDENCE ICD: 494 Ohm
RV LEAD THRESHOLD: 0.875 V
TOT-0001: 1
TOT-0002: 0
TOT-0006: 20120416000000
TZAT-0001ATACH: 1
TZAT-0001ATACH: 2
TZAT-0001ATACH: 3
TZAT-0001FASTVT: 1
TZAT-0001SLOWVT: 1
TZAT-0001SLOWVT: 2
TZAT-0002ATACH: NEGATIVE
TZAT-0002ATACH: NEGATIVE
TZAT-0002ATACH: NEGATIVE
TZAT-0002FASTVT: NEGATIVE
TZAT-0004SLOWVT: 6
TZAT-0004SLOWVT: 8
TZAT-0005SLOWVT: 84 pct
TZAT-0005SLOWVT: 91 pct
TZAT-0011SLOWVT: 10 ms
TZAT-0011SLOWVT: 10 ms
TZAT-0012ATACH: 150 ms
TZAT-0012ATACH: 150 ms
TZAT-0012ATACH: 150 ms
TZAT-0012FASTVT: 200 ms
TZAT-0012SLOWVT: 200 ms
TZAT-0012SLOWVT: 200 ms
TZAT-0013SLOWVT: 2
TZAT-0013SLOWVT: 2
TZAT-0018ATACH: NEGATIVE
TZAT-0018ATACH: NEGATIVE
TZAT-0018ATACH: NEGATIVE
TZAT-0018FASTVT: NEGATIVE
TZAT-0018SLOWVT: NEGATIVE
TZAT-0018SLOWVT: NEGATIVE
TZAT-0019ATACH: 6 V
TZAT-0019ATACH: 6 V
TZAT-0019ATACH: 6 V
TZAT-0019FASTVT: 8 V
TZAT-0019SLOWVT: 8 V
TZAT-0019SLOWVT: 8 V
TZAT-0020ATACH: 1.5 ms
TZAT-0020ATACH: 1.5 ms
TZAT-0020ATACH: 1.5 ms
TZAT-0020FASTVT: 1.5 ms
TZAT-0020SLOWVT: 1.5 ms
TZAT-0020SLOWVT: 1.5 ms
TZON-0003ATACH: 350 ms
TZON-0003SLOWVT: 340 ms
TZON-0003VSLOWVT: 450 ms
TZON-0004SLOWVT: 32
TZON-0004VSLOWVT: 20
TZON-0005SLOWVT: 12
TZST-0001ATACH: 4
TZST-0001ATACH: 5
TZST-0001ATACH: 6
TZST-0001FASTVT: 2
TZST-0001FASTVT: 3
TZST-0001FASTVT: 4
TZST-0001FASTVT: 5
TZST-0001FASTVT: 6
TZST-0001SLOWVT: 3
TZST-0001SLOWVT: 4
TZST-0001SLOWVT: 5
TZST-0001SLOWVT: 6
TZST-0002ATACH: NEGATIVE
TZST-0002ATACH: NEGATIVE
TZST-0002ATACH: NEGATIVE
TZST-0002FASTVT: NEGATIVE
TZST-0002FASTVT: NEGATIVE
TZST-0002FASTVT: NEGATIVE
TZST-0002FASTVT: NEGATIVE
TZST-0002FASTVT: NEGATIVE
TZST-0003SLOWVT: 15 J
TZST-0003SLOWVT: 28 J
TZST-0003SLOWVT: 35 J
TZST-0003SLOWVT: 35 J
VENTRICULAR PACING ICD: 74.77 pct
VF: 0

## 2013-01-22 NOTE — Patient Instructions (Signed)
Remote monitoring is used to monitor your Pacemaker of ICD from home. This monitoring reduces the number of office visits required to check your device to one time per year. It allows [place] to keep an eye on the functioning of your device to ensure it is working properly. You are scheduled for a device check from home on 04/14/2013. You may send your transmission at any time that day. If you have a wireless device, the transmission will be sent automatically. After your physician reviews your transmission, you will receive a postcard with your next transmission date.  Your physician wants you to follow-up in: one year with Dr. Ladona Ridgel.  You will receive a reminder letter in the mail two months in advance. If you don't receive a letter, please call our office to schedule the follow-up appointment.

## 2013-01-25 ENCOUNTER — Encounter: Payer: Self-pay | Admitting: Internal Medicine

## 2013-01-25 NOTE — Assessment & Plan Note (Signed)
the patient's symptoms are well compensated and class IIA. He will continue his current medical therapy. He is instructed to maintain a low-sodium diet.

## 2013-01-25 NOTE — Assessment & Plan Note (Signed)
His Medtronic biventricular ICD is working normally. We'll plan to recheck in several months. 

## 2013-01-25 NOTE — Progress Notes (Signed)
HPI [name] returns today for followup. He is a very pleasant 79 year old man with a nonischemic cardiomyopathy, complete heart block, chronic systolic heart failure, chronic atrial fibrillation, status post biventricular ICD implantation secondary to all the above in the setting of left bundle branch block. The patient denies chest pain, shortness of breath, or peripheral edema. He remains active. No syncope. Allergies  Allergen Reactions  . Clarithromycin Nausea Only  . Famotidine Other (See Comments)    ABD. PAIN  . Omeprazole Diarrhea  . Oxytetracycline     REACTION: BUMPS  . Penicillins     REACTION: SWELLING Amoxicillin ok  . Proton Pump Inhibitors Other (See Comments)    GI upset, diarrhea, gas, bloating  . Ranitidine Diarrhea     Current Outpatient Prescriptions  Medication Sig Dispense Refill  . carvedilol (COREG) 6.25 MG tablet Take 1 tablet (6.25 mg total) by mouth 2 (two) times daily with a meal.  180 tablet  1  . diazepam (VALIUM) 5 MG tablet TAKE 1/4 TABLET EVERY EVENING AT BEDTIME AS NEEDED  30 tablet  1  . enalapril (VASOTEC) 5 MG tablet Take 1 tablet (5 mg total) by mouth 2 (two) times daily.  180 tablet  3  . flunisolide (NASAREL) 29 MCG/ACT (0.025%) nasal spray 2 sprays by Nasal route daily. Dose is for each nostril.       . Fluticasone-Salmeterol (ADVAIR DISKUS) 100-50 MCG/DOSE AEPB Inhale 1 puff into the lungs as needed.       . furosemide (LASIX) 20 MG tablet Take 10 mg by mouth daily.      . Loratadine 10 MG CAPS Take 1 capsule by mouth as needed.       . montelukast (SINGULAIR) 10 MG tablet Take 10 mg by mouth daily as needed.        . sucralfate (CARAFATE) 1 G tablet TAKE 1/2 A TABLET BY MOUTH AS NEEDED  30 tablet  3  . warfarin (COUMADIN) 5 MG tablet Take as directed by the anticoagulation clinic  135 tablet  1   No current facility-administered medications for this visit.     Past Medical History  Diagnosis Date  . NICM (nonischemic  cardiomyopathy)     Cardiac catheterization March 2006 without coronary disease; echocardiogram August 2008: EF 35%, mild AI, left atrial enlargement  . Atrial flutter     A.  Status post cardioversion; B.  Tikosyn therapy - failed, remains in aflutte  . COPD (chronic obstructive pulmonary disease)   . LBBB (left bundle branch block)   . GERD (gastroesophageal reflux disease) 2010    h/o esophageal stricture with dilation, LA grade C reflux esophagitis by EGD 2010  . Porphyria   . IBS (irritable bowel syndrome)   . BPH (benign prostatic hyperplasia)   . History of diverticulitis of colon   . Allergic rhinitis     [name]  . Chronic systolic congestive heart failure   . History of GI bleed     Secondary to hemorrhoids  . Extrinsic asthma     [name]  . 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   Past Surgical History  Procedure Laterality Date  . Penile prosthesis implant  2007    [name]  . Cholecystectomy    . Goiter removal    . Nose surgery  1971  . Hernia repair    .  Cardiac catheterization  06/22/04    Severe, nonischemic cardiomyopathy,EF 25-30%  . Biv-aicd implant      [employer]  . Colonoscopy  10/99    Divertic, splenic, hepatic fleure only  . Esophagogastroduodenoscopy  01/1998    stricture, sliding HH, GERD  . Esophageal dilation  02/18/98  . Esophagogastroduodenoscopy  04/08/01    stricture, gastritis, HH, GERD-no dilation([name])  . Hernia repair  01/30/02    [name]  . Pulmonary eval  04/2002    ([name]) Chronic congestive symptoms  . US echocardiography  07/13/04    EF 25-30%, Mod LVH; LA severe dilation; Mild AR; IRTR  . Esophagogastroduodenoscopy  12/22/04    stricture; gastritis; duodenitis, GERD  . US echocardiography  11/27/06    hypokinesis posterior wall, EF 35%; mild AR  . Esophagogastroduodenoscopy  10/21/08    Reflux esophagitis; Erythem. Duod. (Dr.  Mechele [name])  . Colonoscopy  10/21/08    aborted-divertics, int hemms (Dr. Mechele [name])  . Cardioversion  02/22/09    AFlutter-([name])  . Coronary angioplasty    . Back surgery  1997    bulging disks     Family History  Problem Relation Age of Onset  . Coronary artery disease Father   . Hypertension Father   . Dysphagia Sister   . Breast cancer    . Ovarian cancer    . Uterine cancer    . Heart failure Mother   . Other Sister     stomach problems  . Other Sister     stomach problems  . Other Sister     stomach problems  . Alcohol abuse Maternal Uncle      History   Social History  . Marital Status: Married    Spouse Name: N/A    Number of Children: 3  . Years of Education: N/A   Occupational History  . retired   .     Social History Main Topics  . Smoking status: Never Smoker   . Smokeless tobacco: Never Used  . Alcohol Use: No  . Drug Use: No  . Sexual Activity: Not on file   Other Topics Concern  . Not on file   Social History Narrative   Lives with wife   3 children-8 grandchildren   Still works on a farm.   Retired from [employer]   Had [employer] friend with Dr. Gavin [name]   No regular exercise     BP 122/78  Pulse 76  Ht 5\' 7"  (1.702 m)  Wt 146 lb 1.9 oz (66.28 kg)  BMI 22.88 kg/m2  Physical Exam:  Well appearing 79 year old man, NAD HEENT: Unremarkable Neck:  No JVD, no thyromegally Lungs:  Clear with no wheezes, rales, or rhonchi. Well-healed ICD incision. HEART:  Regular rate rhythm, no murmurs, no rubs, no clicks Abd:  soft, positive bowel sounds, no organomegally, no rebound, no guarding Ext:  2 plus pulses, no edema, no cyanosis, no clubbing Skin:  No rashes no nodules Neuro:  CN II through XII intact, motor grossly intact  DEVICE  Normal device function.  See PaceArt for details.   Assess/Plan:

## 2013-01-25 NOTE — Assessment & Plan Note (Signed)
His ventricular rate is well controlled as the patient has underlying complete heart block. No change in medical therapy.

## 2013-02-03 ENCOUNTER — Other Ambulatory Visit: Payer: Self-pay | Admitting: *Deleted

## 2013-02-03 MED ORDER — WARFARIN SODIUM 5 MG PO TABS: ORAL_TABLET | ORAL | Status: DC

## 2013-02-18 ENCOUNTER — Ambulatory Visit (INDEPENDENT_AMBULATORY_CARE_PROVIDER_SITE_OTHER): Payer: Medicare Other | Admitting: *Deleted

## 2013-02-18 DIAGNOSIS — Z7901 Long term (current) use of anticoagulants: Secondary | ICD-10-CM

## 2013-02-18 DIAGNOSIS — I4892 Unspecified atrial flutter: Secondary | ICD-10-CM

## 2013-02-18 LAB — POCT INR: INR: 2.5

## 2013-03-25 DIAGNOSIS — J018 Other acute sinusitis: Secondary | ICD-10-CM | POA: Diagnosis not present

## 2013-03-25 DIAGNOSIS — J328 Other chronic sinusitis: Secondary | ICD-10-CM | POA: Diagnosis not present

## 2013-03-25 DIAGNOSIS — H6120 Impacted cerumen, unspecified ear: Secondary | ICD-10-CM | POA: Diagnosis not present

## 2013-03-26 ENCOUNTER — Ambulatory Visit: Payer: Medicare Other | Admitting: Cardiology

## 2013-04-08 ENCOUNTER — Ambulatory Visit (INDEPENDENT_AMBULATORY_CARE_PROVIDER_SITE_OTHER): Payer: Medicare Other

## 2013-04-08 DIAGNOSIS — I4892 Unspecified atrial flutter: Secondary | ICD-10-CM | POA: Diagnosis not present

## 2013-04-08 DIAGNOSIS — Z7901 Long term (current) use of anticoagulants: Secondary | ICD-10-CM

## 2013-04-08 LAB — POCT INR: INR: 3.2

## 2013-04-24 ENCOUNTER — Ambulatory Visit (INDEPENDENT_AMBULATORY_CARE_PROVIDER_SITE_OTHER): Payer: Medicare Other | Admitting: *Deleted

## 2013-04-24 DIAGNOSIS — I447 Left bundle-branch block, unspecified: Secondary | ICD-10-CM | POA: Diagnosis not present

## 2013-04-24 DIAGNOSIS — I429 Cardiomyopathy, unspecified: Secondary | ICD-10-CM | POA: Diagnosis not present

## 2013-04-24 DIAGNOSIS — I5022 Chronic systolic (congestive) heart failure: Secondary | ICD-10-CM

## 2013-04-24 DIAGNOSIS — I4892 Unspecified atrial flutter: Secondary | ICD-10-CM

## 2013-04-24 LAB — MDC_IDC_ENUM_SESS_TYPE_REMOTE
Battery Voltage: 3.01 V
Brady Statistic AP VP Percent: 0 %
Brady Statistic AP VS Percent: 0 %
Brady Statistic AS VP Percent: 93.52 %
Brady Statistic AS VS Percent: 6.48 %
Brady Statistic RA Percent Paced: 0 %
Brady Statistic RV Percent Paced: 93.52 %
Date Time Interrogation Session: 20150116093628
HighPow Impedance: 228 Ohm
HighPow Impedance: 35 Ohm
HighPow Impedance: 361 Ohm
HighPow Impedance: 44 Ohm
Lead Channel Impedance Value: 209 Ohm
Lead Channel Impedance Value: 285 Ohm
Lead Channel Impedance Value: 399 Ohm
Lead Channel Impedance Value: 418 Ohm
Lead Channel Impedance Value: 494 Ohm
Lead Channel Pacing Threshold Amplitude: 0.5 V
Lead Channel Pacing Threshold Amplitude: 0.875 V
Lead Channel Pacing Threshold Amplitude: 1.125 V
Lead Channel Pacing Threshold Pulse Width: 0.4 ms
Lead Channel Pacing Threshold Pulse Width: 0.4 ms
Lead Channel Pacing Threshold Pulse Width: 0.4 ms
Lead Channel Sensing Intrinsic Amplitude: 15.375 mV
Lead Channel Sensing Intrinsic Amplitude: 15.375 mV
Lead Channel Sensing Intrinsic Amplitude: 5.875 mV
Lead Channel Sensing Intrinsic Amplitude: 5.875 mV
Lead Channel Setting Pacing Amplitude: 2 V
Lead Channel Setting Pacing Amplitude: 2.25 V
Lead Channel Setting Pacing Amplitude: 2.5 V
Lead Channel Setting Pacing Pulse Width: 0.4 ms
Lead Channel Setting Pacing Pulse Width: 0.4 ms
Lead Channel Setting Sensing Sensitivity: 0.45 mV
Zone Setting Detection Interval: 300 ms
Zone Setting Detection Interval: 340 ms
Zone Setting Detection Interval: 350 ms
Zone Setting Detection Interval: 450 ms

## 2013-04-28 DIAGNOSIS — J309 Allergic rhinitis, unspecified: Secondary | ICD-10-CM | POA: Diagnosis not present

## 2013-04-28 DIAGNOSIS — J45909 Unspecified asthma, uncomplicated: Secondary | ICD-10-CM | POA: Diagnosis not present

## 2013-04-28 DIAGNOSIS — J019 Acute sinusitis, unspecified: Secondary | ICD-10-CM | POA: Diagnosis not present

## 2013-04-28 DIAGNOSIS — H1045 Other chronic allergic conjunctivitis: Secondary | ICD-10-CM | POA: Diagnosis not present

## 2013-04-28 DIAGNOSIS — K219 Gastro-esophageal reflux disease without esophagitis: Secondary | ICD-10-CM | POA: Diagnosis not present

## 2013-05-05 ENCOUNTER — Encounter: Payer: Self-pay | Admitting: *Deleted

## 2013-05-06 ENCOUNTER — Ambulatory Visit (INDEPENDENT_AMBULATORY_CARE_PROVIDER_SITE_OTHER): Payer: Medicare Other

## 2013-05-06 DIAGNOSIS — I4892 Unspecified atrial flutter: Secondary | ICD-10-CM

## 2013-05-06 DIAGNOSIS — Z5181 Encounter for therapeutic drug level monitoring: Secondary | ICD-10-CM

## 2013-05-06 DIAGNOSIS — Z7901 Long term (current) use of anticoagulants: Secondary | ICD-10-CM

## 2013-05-06 LAB — POCT INR: INR: 2.7

## 2013-05-15 ENCOUNTER — Encounter: Payer: Self-pay | Admitting: Cardiology

## 2013-05-15 ENCOUNTER — Other Ambulatory Visit: Payer: Self-pay

## 2013-05-15 ENCOUNTER — Ambulatory Visit (INDEPENDENT_AMBULATORY_CARE_PROVIDER_SITE_OTHER): Payer: Medicare Other | Admitting: Cardiology

## 2013-05-15 VITALS — BP 124/80 | HR 75 | Ht 67.0 in | Wt 145.0 lb

## 2013-05-15 DIAGNOSIS — I429 Cardiomyopathy, unspecified: Secondary | ICD-10-CM | POA: Diagnosis not present

## 2013-05-15 DIAGNOSIS — I5022 Chronic systolic (congestive) heart failure: Secondary | ICD-10-CM | POA: Diagnosis not present

## 2013-05-15 DIAGNOSIS — I4892 Unspecified atrial flutter: Secondary | ICD-10-CM | POA: Diagnosis not present

## 2013-05-15 DIAGNOSIS — I728 Aneurysm of other specified arteries: Secondary | ICD-10-CM

## 2013-05-15 MED ORDER — FUROSEMIDE 20 MG PO TABS: 10.0000 mg | ORAL_TABLET | Freq: Every day | ORAL | Status: DC

## 2013-05-15 MED ORDER — CARVEDILOL 6.25 MG PO TABS: 6.2500 mg | ORAL_TABLET | Freq: Two times a day (BID) | ORAL | Status: DC

## 2013-05-15 MED ORDER — ENALAPRIL MALEATE 5 MG PO TABS: 5.0000 mg | ORAL_TABLET | Freq: Two times a day (BID) | ORAL | Status: DC

## 2013-05-15 NOTE — Patient Instructions (Signed)
Your physician recommends that you continue on your current medications as directed. Please refer to the Current Medication list given to you today.  Your physician wants you to follow-up in: 6 months. You will receive a reminder letter in the mail two months in advance. If you don't receive a letter, please call our office to schedule the follow-up appointment.  

## 2013-05-15 NOTE — Progress Notes (Signed)
HPI [name] presents for follow up of his cardiomyopathy and atrial flutter.  He has done well. 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  The patient denies any new symptoms such as chest discomfort, neck or arm discomfort. There has been no new shortness of breath, PND or orthopnea. There have been no reported palpitations, presyncope or syncope.   Allergies  Allergen Reactions  . Clarithromycin Nausea Only  . Famotidine Other (See Comments)    ABD. PAIN  . Omeprazole Diarrhea  . Oxytetracycline     REACTION: BUMPS  . Penicillins     REACTION: SWELLING Amoxicillin ok  . Proton Pump Inhibitors Other (See Comments)    GI upset, diarrhea, gas, bloating  . Ranitidine Diarrhea    Current Outpatient Prescriptions  Medication Sig Dispense Refill  . carvedilol (COREG) 6.25 MG tablet Take 1 tablet (6.25 mg total) by mouth 2 (two) times daily with a meal.  180 tablet  1  . diazepam (VALIUM) 5 MG tablet TAKE 1/4 TABLET EVERY EVENING AT BEDTIME AS NEEDED  30 tablet  1  . enalapril (VASOTEC) 5 MG tablet Take 1 tablet (5 mg total) by mouth 2 (two) times daily.  180 tablet  3  . flunisolide (NASAREL) 29 MCG/ACT (0.025%) nasal spray 2 sprays by Nasal route daily. Dose is for each nostril.       . Fluticasone-Salmeterol (ADVAIR DISKUS) 100-50 MCG/DOSE AEPB Inhale 1 puff into the lungs as needed.       . furosemide (LASIX) 20 MG tablet Take 10 mg by mouth daily.      . Loratadine 10 MG CAPS Take 1 capsule by mouth as needed.       . montelukast (SINGULAIR) 10 MG tablet Take 10 mg by mouth daily as needed.        . sucralfate (CARAFATE) 1 G tablet TAKE 1/2 A TABLET BY MOUTH AS NEEDED  30 tablet  3  . warfarin (COUMADIN) 5 MG tablet Take as directed by the anticoagulation clinic  135 tablet  1   No current facility-administered medications for this visit.    Past Medical History  Diagnosis Date  . NICM (nonischemic cardiomyopathy)    Cardiac catheterization March 2006 without coronary disease; echocardiogram August 2008: EF 35%, mild AI, left atrial enlargement  . Atrial flutter     A.  Status post cardioversion; B.  Tikosyn therapy - failed, remains in aflutte  . COPD (chronic obstructive pulmonary disease)   . LBBB (left bundle branch block)   . GERD (gastroesophageal reflux disease) 2010    h/o esophageal stricture with dilation, LA grade C reflux esophagitis by EGD 2010  . Porphyria   . IBS (irritable bowel syndrome)   . BPH (benign prostatic hyperplasia)   . History of diverticulitis of colon   . Allergic rhinitis     [name]  . Chronic systolic congestive heart failure   . History of GI bleed     Secondary to hemorrhoids  . Extrinsic asthma     [name]  . 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 Lucky Cowboy)    Past Surgical History  Procedure Laterality Date  . Penile prosthesis implant  2007    Otelin  . Cholecystectomy    . Goiter removal    . Nose surgery  1971  . Hernia repair    . Cardiac catheterization  06/22/04    Severe, nonischemic  cardiomyopathy,EF 25-30%  . Biv-aicd implant      [employer]tr[employer]  . Colonoscopy  10/99    Divertic, splenic, hepatic fleure only  . Esophagogastroduodenoscopy  01/1998    stricture, sliding HH, GERD  . Esophageal dilation  02/18/98  . Esophagogastroduodenoscopy  04/08/01    stricture, gastritis, HH, GERD-no dilation(Dr. Henrene Pastor)  . Hernia repair  01/30/02    Dr. Hassell Done  . Pulmonary eval  04/2002    ([facility]) Chronic congestive symptoms  . US echocardiography  07/13/04    EF 25-30%, Mod LVH; LA severe dilation; Mild AR; IRTR  . Esophagogastroduodenoscopy  12/22/04    stricture; gastritis; duodenitis, GERD  . US echocardiography  11/27/06    hypokinesis posterior wall, EF 35%; mild AR  . Esophagogastroduodenoscopy  10/21/08    Reflux esophagitis; Erythem. Duod. (Dr. Vira Agar)  . Colonoscopy  10/21/08    aborted-divertics, int hemms (Dr. Vira Agar)  .  Cardioversion  02/22/09    AFlutter-([facility])  . Coronary angioplasty    . Back surgery  1997    bulging disks    ROS:  As stated in the HPI and negative for all other systems.  PHYSICAL EXAM BP 124/80  Pulse 75  Ht 5\' 7"  (1.702 m)  Wt 145 lb (65.772 kg)  BMI 22.71 kg/m2 GENERAL:  Well appearing NECK:  No jugular venous distention, waveform within normal limits, carotid upstroke brisk and symmetric, no bruits, no thyromegaly LUNGS:  Clear to auscultation bilaterally CHEST:  Well healed ICD pocket HEART:  PMI not displaced or sustained,S1 and S2 within normal limits, no S3, no clicks, no rubs, no murmurs ABD:  Flat, positive bowel sounds normal in frequency in pitch, no bruits, no rebound, no guarding, no midline pulsatile mass, no hepatomegaly, no splenomegaly EXT:  2 plus pulses throughout, no edema, no cyanosis no clubbing   EKG:  Atrial flutter, demand ventricular pacemaker rate 75.  05/15/2013  ASSESSMENT AND PLAN  Nonischemic cardiomyopathy -  He seems to be euvolemic. At this point, no change in therapy is indicated. . No further cardiovascular testing is indicated.   Atrial flutter -  The patient tolerates this rhythm and rate control and anticoagulation. We will continue with the meds as listed. He does not want to switch from warfarin.

## 2013-05-25 ENCOUNTER — Encounter: Payer: Self-pay | Admitting: Internal Medicine

## 2013-06-03 DIAGNOSIS — R066 Hiccough: Secondary | ICD-10-CM | POA: Diagnosis not present

## 2013-06-03 DIAGNOSIS — R112 Nausea with vomiting, unspecified: Secondary | ICD-10-CM | POA: Diagnosis not present

## 2013-06-07 DIAGNOSIS — R918 Other nonspecific abnormal finding of lung field: Secondary | ICD-10-CM

## 2013-06-07 HISTORY — DX: Other nonspecific abnormal finding of lung field: R91.8

## 2013-06-10 ENCOUNTER — Ambulatory Visit (INDEPENDENT_AMBULATORY_CARE_PROVIDER_SITE_OTHER): Payer: Medicare Other

## 2013-06-10 DIAGNOSIS — Z7901 Long term (current) use of anticoagulants: Secondary | ICD-10-CM | POA: Diagnosis not present

## 2013-06-10 DIAGNOSIS — I4892 Unspecified atrial flutter: Secondary | ICD-10-CM | POA: Diagnosis not present

## 2013-06-10 DIAGNOSIS — Z5181 Encounter for therapeutic drug level monitoring: Secondary | ICD-10-CM | POA: Diagnosis not present

## 2013-06-10 LAB — POCT INR: INR: 3.5

## 2013-06-30 DIAGNOSIS — H1045 Other chronic allergic conjunctivitis: Secondary | ICD-10-CM | POA: Diagnosis not present

## 2013-06-30 DIAGNOSIS — J309 Allergic rhinitis, unspecified: Secondary | ICD-10-CM | POA: Diagnosis not present

## 2013-06-30 DIAGNOSIS — J019 Acute sinusitis, unspecified: Secondary | ICD-10-CM | POA: Diagnosis not present

## 2013-06-30 DIAGNOSIS — J45909 Unspecified asthma, uncomplicated: Secondary | ICD-10-CM | POA: Diagnosis not present

## 2013-06-30 DIAGNOSIS — K219 Gastro-esophageal reflux disease without esophagitis: Secondary | ICD-10-CM | POA: Diagnosis not present

## 2013-07-01 ENCOUNTER — Ambulatory Visit (INDEPENDENT_AMBULATORY_CARE_PROVIDER_SITE_OTHER): Payer: Medicare Other

## 2013-07-01 DIAGNOSIS — I4892 Unspecified atrial flutter: Secondary | ICD-10-CM

## 2013-07-01 DIAGNOSIS — Z5181 Encounter for therapeutic drug level monitoring: Secondary | ICD-10-CM

## 2013-07-01 DIAGNOSIS — Z7901 Long term (current) use of anticoagulants: Secondary | ICD-10-CM

## 2013-07-01 LAB — POCT INR: INR: 2.7

## 2013-07-07 ENCOUNTER — Ambulatory Visit: Payer: Self-pay | Admitting: Otolaryngology

## 2013-07-07 DIAGNOSIS — R059 Cough, unspecified: Secondary | ICD-10-CM | POA: Diagnosis not present

## 2013-07-07 DIAGNOSIS — R042 Hemoptysis: Secondary | ICD-10-CM | POA: Diagnosis not present

## 2013-07-07 DIAGNOSIS — J328 Other chronic sinusitis: Secondary | ICD-10-CM | POA: Diagnosis not present

## 2013-07-07 DIAGNOSIS — J018 Other acute sinusitis: Secondary | ICD-10-CM | POA: Diagnosis not present

## 2013-07-07 DIAGNOSIS — J449 Chronic obstructive pulmonary disease, unspecified: Secondary | ICD-10-CM | POA: Diagnosis not present

## 2013-07-08 ENCOUNTER — Ambulatory Visit (INDEPENDENT_AMBULATORY_CARE_PROVIDER_SITE_OTHER): Payer: Medicare Other

## 2013-07-08 DIAGNOSIS — Z7901 Long term (current) use of anticoagulants: Secondary | ICD-10-CM | POA: Diagnosis not present

## 2013-07-08 DIAGNOSIS — I4892 Unspecified atrial flutter: Secondary | ICD-10-CM

## 2013-07-08 DIAGNOSIS — Z5181 Encounter for therapeutic drug level monitoring: Secondary | ICD-10-CM | POA: Diagnosis not present

## 2013-07-08 LAB — POCT INR: INR: 3

## 2013-07-09 ENCOUNTER — Telehealth: Payer: Self-pay | Admitting: Family Medicine

## 2013-07-09 ENCOUNTER — Encounter: Payer: Self-pay | Admitting: Family Medicine

## 2013-07-09 NOTE — Telephone Encounter (Signed)
recieved call from Dr. Nadeen Landau ENT regarding new dx lung cancer RUL mass.  Pt has been plugged in with Dr. Maryjane Hurter onc and Dr. Sharolyn Douglas CT surgery.

## 2013-07-10 ENCOUNTER — Ambulatory Visit: Payer: Self-pay | Admitting: Otolaryngology

## 2013-07-10 DIAGNOSIS — R911 Solitary pulmonary nodule: Secondary | ICD-10-CM | POA: Diagnosis not present

## 2013-07-10 DIAGNOSIS — R042 Hemoptysis: Secondary | ICD-10-CM | POA: Diagnosis not present

## 2013-07-10 DIAGNOSIS — J984 Other disorders of lung: Secondary | ICD-10-CM | POA: Diagnosis not present

## 2013-07-13 ENCOUNTER — Ambulatory Visit: Payer: Self-pay | Admitting: Oncology

## 2013-07-13 DIAGNOSIS — Z950 Presence of cardiac pacemaker: Secondary | ICD-10-CM | POA: Diagnosis not present

## 2013-07-13 DIAGNOSIS — I4891 Unspecified atrial fibrillation: Secondary | ICD-10-CM | POA: Diagnosis not present

## 2013-07-13 DIAGNOSIS — J984 Other disorders of lung: Secondary | ICD-10-CM | POA: Diagnosis not present

## 2013-07-13 DIAGNOSIS — I10 Essential (primary) hypertension: Secondary | ICD-10-CM | POA: Diagnosis not present

## 2013-07-13 DIAGNOSIS — Z79899 Other long term (current) drug therapy: Secondary | ICD-10-CM | POA: Diagnosis not present

## 2013-07-13 LAB — CBC CANCER CENTER
Basophil #: 0.2 x10 3/mm — ABNORMAL HIGH (ref 0.0–0.1)
Basophil %: 1.8 %
Eosinophil #: 0.2 x10 3/mm (ref 0.0–0.7)
Eosinophil %: 2.1 %
HCT: 42.2 % (ref 40.0–52.0)
HGB: 13.7 g/dL (ref 13.0–18.0)
Lymphocyte #: 1.3 x10 3/mm (ref 1.0–3.6)
Lymphocyte %: 15.5 %
MCH: 28 pg (ref 26.0–34.0)
MCHC: 32.6 g/dL (ref 32.0–36.0)
MCV: 86 fL (ref 80–100)
Monocyte #: 0.6 x10 3/mm (ref 0.2–1.0)
Monocyte %: 7.1 %
Neutrophil #: 6.3 x10 3/mm (ref 1.4–6.5)
Neutrophil %: 73.5 %
Platelet: 168 x10 3/mm (ref 150–440)
RBC: 4.9 10*6/uL (ref 4.40–5.90)
RDW: 16 % — ABNORMAL HIGH (ref 11.5–14.5)
WBC: 8.6 x10 3/mm (ref 3.8–10.6)

## 2013-07-13 LAB — COMPREHENSIVE METABOLIC PANEL
Albumin: 3.5 g/dL (ref 3.4–5.0)
Alkaline Phosphatase: 82 U/L
Anion Gap: 8 (ref 7–16)
BUN: 32 mg/dL — ABNORMAL HIGH (ref 7–18)
Bilirubin,Total: 0.5 mg/dL (ref 0.2–1.0)
Calcium, Total: 9 mg/dL (ref 8.5–10.1)
Chloride: 101 mmol/L (ref 98–107)
Co2: 29 mmol/L (ref 21–32)
Creatinine: 1.24 mg/dL (ref 0.60–1.30)
EGFR (African American): >60
EGFR (Non-African Amer.): 55 — ABNORMAL LOW
Glucose: 104 mg/dL — ABNORMAL HIGH (ref 65–99)
Osmolality: 283 (ref 275–301)
Potassium: 5.3 mmol/L — ABNORMAL HIGH (ref 3.5–5.1)
SGOT(AST): 22 U/L (ref 15–37)
SGPT (ALT): 18 U/L (ref 12–78)
Sodium: 138 mmol/L (ref 136–145)
Total Protein: 6.8 g/dL (ref 6.4–8.2)

## 2013-07-13 LAB — SEDIMENTATION RATE: Erythrocyte Sed Rate: 5 mm/hr (ref 0–20)

## 2013-07-13 LAB — LACTATE DEHYDROGENASE: LDH: 197 U/L (ref 85–241)

## 2013-07-17 DIAGNOSIS — R918 Other nonspecific abnormal finding of lung field: Secondary | ICD-10-CM | POA: Diagnosis not present

## 2013-07-17 DIAGNOSIS — J329 Chronic sinusitis, unspecified: Secondary | ICD-10-CM | POA: Diagnosis not present

## 2013-07-18 DIAGNOSIS — R918 Other nonspecific abnormal finding of lung field: Secondary | ICD-10-CM | POA: Diagnosis not present

## 2013-07-20 DIAGNOSIS — R918 Other nonspecific abnormal finding of lung field: Secondary | ICD-10-CM | POA: Diagnosis not present

## 2013-07-21 DIAGNOSIS — R918 Other nonspecific abnormal finding of lung field: Secondary | ICD-10-CM | POA: Diagnosis not present

## 2013-07-21 DIAGNOSIS — J329 Chronic sinusitis, unspecified: Secondary | ICD-10-CM | POA: Diagnosis not present

## 2013-07-27 ENCOUNTER — Encounter: Payer: Medicare Other | Admitting: *Deleted

## 2013-07-29 ENCOUNTER — Ambulatory Visit (INDEPENDENT_AMBULATORY_CARE_PROVIDER_SITE_OTHER): Payer: Medicare Other

## 2013-07-29 DIAGNOSIS — Z5181 Encounter for therapeutic drug level monitoring: Secondary | ICD-10-CM | POA: Diagnosis not present

## 2013-07-29 DIAGNOSIS — Z7901 Long term (current) use of anticoagulants: Secondary | ICD-10-CM | POA: Diagnosis not present

## 2013-07-29 DIAGNOSIS — I4892 Unspecified atrial flutter: Secondary | ICD-10-CM | POA: Diagnosis not present

## 2013-07-29 LAB — POCT INR: INR: 3.3

## 2013-08-02 ENCOUNTER — Other Ambulatory Visit: Payer: Self-pay | Admitting: Family Medicine

## 2013-08-02 DIAGNOSIS — E538 Deficiency of other specified B group vitamins: Secondary | ICD-10-CM

## 2013-08-02 DIAGNOSIS — I5022 Chronic systolic (congestive) heart failure: Secondary | ICD-10-CM

## 2013-08-02 DIAGNOSIS — Z5181 Encounter for therapeutic drug level monitoring: Secondary | ICD-10-CM

## 2013-08-02 DIAGNOSIS — I509 Heart failure, unspecified: Secondary | ICD-10-CM

## 2013-08-02 DIAGNOSIS — D696 Thrombocytopenia, unspecified: Secondary | ICD-10-CM

## 2013-08-03 ENCOUNTER — Encounter: Payer: Self-pay | Admitting: *Deleted

## 2013-08-03 ENCOUNTER — Other Ambulatory Visit (INDEPENDENT_AMBULATORY_CARE_PROVIDER_SITE_OTHER): Payer: Medicare Other

## 2013-08-03 DIAGNOSIS — I509 Heart failure, unspecified: Secondary | ICD-10-CM

## 2013-08-03 DIAGNOSIS — D696 Thrombocytopenia, unspecified: Secondary | ICD-10-CM

## 2013-08-03 DIAGNOSIS — E538 Deficiency of other specified B group vitamins: Secondary | ICD-10-CM

## 2013-08-03 DIAGNOSIS — I5022 Chronic systolic (congestive) heart failure: Secondary | ICD-10-CM

## 2013-08-03 LAB — BASIC METABOLIC PANEL
BUN: 20 mg/dL (ref 6–23)
CO2: 29 mEq/L (ref 19–32)
Calcium: 9.1 mg/dL (ref 8.4–10.5)
Chloride: 103 mEq/L (ref 96–112)
Creatinine, Ser: 1 mg/dL (ref 0.4–1.5)
GFR: 77.33 mL/min (ref >60.00)
Glucose, Bld: 94 mg/dL (ref 70–99)
Potassium: 4.7 mEq/L (ref 3.5–5.1)
Sodium: 138 mEq/L (ref 135–145)

## 2013-08-03 LAB — CBC WITH DIFFERENTIAL/PLATELET
Basophils Absolute: 0.1 10*3/uL (ref 0.0–0.1)
Basophils Relative: 1.2 % (ref 0.0–3.0)
Eosinophils Absolute: 0.2 10*3/uL (ref 0.0–0.7)
Eosinophils Relative: 4 % (ref 0.0–5.0)
HCT: 40.8 % (ref 39.0–52.0)
Hemoglobin: 13.6 g/dL (ref 13.0–17.0)
Lymphocytes Relative: 25.9 % (ref 12.0–46.0)
Lymphs Abs: 1.2 10*3/uL (ref 0.7–4.0)
MCHC: 33.2 g/dL (ref 30.0–36.0)
MCV: 87.2 fl (ref 78.0–100.0)
Monocytes Absolute: 0.4 10*3/uL (ref 0.1–1.0)
Monocytes Relative: 7.5 % (ref 3.0–12.0)
Neutro Abs: 2.9 10*3/uL (ref 1.4–7.7)
Neutrophils Relative %: 61.4 % (ref 43.0–77.0)
Platelets: 131 10*3/uL — ABNORMAL LOW (ref 150.0–400.0)
RBC: 4.69 Mil/uL (ref 4.22–5.81)
RDW: 15.9 % — ABNORMAL HIGH (ref 11.5–14.6)
WBC: 4.7 10*3/uL (ref 4.5–10.5)

## 2013-08-03 LAB — LIPID PANEL
Cholesterol: 172 mg/dL (ref 0–200)
HDL: 47.8 mg/dL (ref >39.00)
LDL Cholesterol: 96 mg/dL (ref 0–99)
Total CHOL/HDL Ratio: 4
Triglycerides: 140 mg/dL (ref 0.0–149.0)
VLDL: 28 mg/dL (ref 0.0–40.0)

## 2013-08-03 LAB — TSH: TSH: 1.8 u[IU]/mL (ref 0.35–5.50)

## 2013-08-03 LAB — VITAMIN B12: Vitamin B-12: 179 pg/mL — ABNORMAL LOW (ref 211–911)

## 2013-08-06 ENCOUNTER — Other Ambulatory Visit: Payer: Medicare Other

## 2013-08-07 ENCOUNTER — Ambulatory Visit: Payer: Self-pay | Admitting: Oncology

## 2013-08-07 HISTORY — PX: ESOPHAGOGASTRODUODENOSCOPY: SHX1529

## 2013-08-13 ENCOUNTER — Encounter: Payer: Medicare Other | Admitting: Family Medicine

## 2013-08-17 ENCOUNTER — Telehealth: Payer: Self-pay | Admitting: Family Medicine

## 2013-08-17 ENCOUNTER — Ambulatory Visit (INDEPENDENT_AMBULATORY_CARE_PROVIDER_SITE_OTHER): Payer: Medicare Other | Admitting: Family Medicine

## 2013-08-17 ENCOUNTER — Encounter: Payer: Self-pay | Admitting: Family Medicine

## 2013-08-17 VITALS — BP 112/78 | HR 60 | Temp 97.6°F | Wt 137.8 lb

## 2013-08-17 DIAGNOSIS — R112 Nausea with vomiting, unspecified: Secondary | ICD-10-CM

## 2013-08-17 DIAGNOSIS — R110 Nausea: Secondary | ICD-10-CM

## 2013-08-17 DIAGNOSIS — R5381 Other malaise: Secondary | ICD-10-CM | POA: Insufficient documentation

## 2013-08-17 DIAGNOSIS — R5383 Other fatigue: Secondary | ICD-10-CM | POA: Diagnosis not present

## 2013-08-17 DIAGNOSIS — R1110 Vomiting, unspecified: Secondary | ICD-10-CM | POA: Diagnosis not present

## 2013-08-17 LAB — CBC WITH DIFFERENTIAL/PLATELET
Basophils Absolute: 0 10*3/uL (ref 0.0–0.1)
Basophils Relative: 0.6 % (ref 0.0–3.0)
Eosinophils Absolute: 0.1 10*3/uL (ref 0.0–0.7)
Eosinophils Relative: 1.1 % (ref 0.0–5.0)
HCT: 42.3 % (ref 39.0–52.0)
Hemoglobin: 14.3 g/dL (ref 13.0–17.0)
Lymphocytes Relative: 29 % (ref 12.0–46.0)
Lymphs Abs: 2.1 10*3/uL (ref 0.7–4.0)
MCHC: 33.7 g/dL (ref 30.0–36.0)
MCV: 86.5 fl (ref 78.0–100.0)
Monocytes Absolute: 0.6 10*3/uL (ref 0.1–1.0)
Monocytes Relative: 7.8 % (ref 3.0–12.0)
Neutro Abs: 4.4 10*3/uL (ref 1.4–7.7)
Neutrophils Relative %: 61.5 % (ref 43.0–77.0)
Platelets: 149 10*3/uL — ABNORMAL LOW (ref 150.0–400.0)
RBC: 4.89 Mil/uL (ref 4.22–5.81)
RDW: 15.6 % — ABNORMAL HIGH (ref 11.5–15.5)
WBC: 7.2 10*3/uL (ref 4.0–10.5)

## 2013-08-17 LAB — COMPREHENSIVE METABOLIC PANEL
ALT: 19 U/L (ref 0–53)
AST: 24 U/L (ref 0–37)
Albumin: 4.3 g/dL (ref 3.5–5.2)
Alkaline Phosphatase: 53 U/L (ref 39–117)
BUN: 16 mg/dL (ref 6–23)
CO2: 26 mEq/L (ref 19–32)
Calcium: 9.6 mg/dL (ref 8.4–10.5)
Chloride: 99 mEq/L (ref 96–112)
Creatinine, Ser: 1.1 mg/dL (ref 0.4–1.5)
GFR: 69.94 mL/min (ref >60.00)
Glucose, Bld: 104 mg/dL — ABNORMAL HIGH (ref 70–99)
Potassium: 4.7 mEq/L (ref 3.5–5.1)
Sodium: 133 mEq/L — ABNORMAL LOW (ref 135–145)
Total Bilirubin: 1.4 mg/dL — ABNORMAL HIGH (ref 0.2–1.2)
Total Protein: 6.8 g/dL (ref 6.0–8.3)

## 2013-08-17 LAB — POCT URINALYSIS DIPSTICK
Bilirubin, UA: NEGATIVE
Blood, UA: NEGATIVE
Glucose, UA: NEGATIVE
Ketones, UA: NEGATIVE
Leukocytes, UA: NEGATIVE
Nitrite, UA: NEGATIVE
Spec Grav, UA: <=1.005
Urobilinogen, UA: 0.2
pH, UA: 7

## 2013-08-17 LAB — LIPASE: Lipase: 26 U/L (ref 11.0–59.0)

## 2013-08-17 LAB — HIGH SENSITIVITY CRP: CRP, High Sensitivity: <0.2 mg/L (ref 0.000–5.000)

## 2013-08-17 LAB — SEDIMENTATION RATE: Sed Rate: 9 mm/hr (ref 0–22)

## 2013-08-17 MED ORDER — ONDANSETRON HCL 4 MG PO TABS: 4.0000 mg | ORAL_TABLET | Freq: Four times a day (QID) | ORAL | Status: DC | PRN

## 2013-08-17 MED ORDER — ONDANSETRON 4 MG PO TBDP
4.0000 mg | ORAL_TABLET | Freq: Once | ORAL | Status: AC
  Administered 2013-08-17: 4 mg via ORAL

## 2013-08-17 NOTE — Assessment & Plan Note (Addendum)
I spoke with Dr. Rich Reining [facility] pulmonologist to touch base about patient.  Thought possible chronic fungal pulmonary infection - pending further evaluation with CT maxillofacial and lung.  Unclear etiology of current sxs, but he does not think related to current pulmonary process.  I don't think this is cardiac related either.  ?viral gastritis vs other. Responded to zofran ODT in office - will send home with zofran $RemoveBef'4mg'bhCteCkVWC$  tab to take prn nausea.  Encouraged continued small sips and clear fluids. Will further evaluate with blood work (CMP, lipase, CBC, CRP and ESR) and UA today UA - tr protein otherwise normal. If worsening malaise, nausea, or new symptoms of abdominal pain or fever, recommended ER evaluation. If not improving with above treatment, pt will call Dr. Dorthula Matas office for sooner evaluation than next scheduled appt of 08/28/2013.

## 2013-08-17 NOTE — Telephone Encounter (Signed)
[name] pts son notified zofran is ready for pick up at [facility]; spoke with Estill Bamberg at [facility] to confirm rx ready.

## 2013-08-17 NOTE — Telephone Encounter (Signed)
Patient's son notified.

## 2013-08-17 NOTE — Patient Instructions (Addendum)
Blood work today - and we will contact you with results. Treat nausea with zofran every 6hours as needed. I touched base with Dr. Adam Phenix.  Please call his office if not improving in next few days for sooner appointment. If feeling better, call to reschedule imaging studies.

## 2013-08-17 NOTE — Progress Notes (Signed)
Pre visit review using our clinic review tool, if applicable. No additional management support is needed unless otherwise documented below in the visit note. 

## 2013-08-17 NOTE — Telephone Encounter (Signed)
plz notify this was sent in for patient.

## 2013-08-17 NOTE — Telephone Encounter (Signed)
Son [name] called and Zophran prescribed for pt was supposed to go to [facility] in [place].  Please call [name] @ [phone] when medication has been re-routed.  / lt

## 2013-08-17 NOTE — Progress Notes (Signed)
BP 112/78  Pulse 60  Temp(Src) 97.6 F (36.4 C) (Oral)  Wt 137 lb 12 oz (62.483 kg)  SpO2 99%   CC: malaise , dry heaves Subjective:    Patient ID: [name], male    DOB: 19-Nov-1931, 80 y.o.   MRN: [id number]  HPI: [name] is a 80 y.o. male presenting on 08/17/2013 for Emesis   Not feeling well today. For last 3-4 days dry heaves and gagging.  + nausea, but no vomiting.  Increased sneezing as well.  Staying congested, mild mucous. Neck staying sore.  Minimal headache.  Constant PNDrainage - attributes nausea to this. No cough, no chest pain, no dyspnea.  No abd pain, diarrhea or constipation.  Appetite comes and goes.  No sick contacts at home.  Weight loss from malaise (3-4 lbs down).  Last stool this am normal.  Normal flatus.  Saw Dr. Nadeen Landau ENT 07/09/2013 regarding concern for new RUL mass. Saw Dr. Maryjane Hurter onc.  Actually saw Dr. Adam Phenix pulmonologist at [facility] - pending workup for this.  Pulm nodules + chronic sinusitis.  CT more likely lung infection than cancer.  Had scheduled rpt CT scan, blood work today and nasal fiber scoping tomorrow - had to cancel this due to malaise and generalized weakness.  Not on any medications for this.  No new medications recently.  Dr. Dorthula Matas # - [phone], [phone]. I spoke with Dr Adam Phenix - sputum cultures and rheum serologies so far negative.  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  Doubt lung cancer as cause of atypical lung imaging findings.  He does not think current sxs are related (malaise, weakness, nausea/dry heaves).  Past Medical History  Diagnosis Date  . NICM (nonischemic cardiomyopathy)     Cardiac catheterization March 2006 without coronary disease; echocardiogram August 2008: EF 35%, mild AI, left atrial enlargement  . Atrial flutter     A.  Status post cardioversion; B.  Tikosyn therapy - failed, remains in aflutte  . COPD (chronic obstructive pulmonary disease)   . LBBB (left bundle branch  block)   . GERD (gastroesophageal reflux disease) 2010    h/o esophageal stricture with dilation, LA grade C reflux esophagitis by EGD 2010  . Porphyria   . IBS (irritable bowel syndrome)   . BPH (benign prostatic hyperplasia)   . History of diverticulitis of colon   . Allergic rhinitis     [name]  . Chronic systolic congestive heart failure   . History of GI bleed     Secondary to hemorrhoids  . Extrinsic asthma     [name]  . 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 Lucky Cowboy)  . Multiple pulmonary nodules 06/2013    RUL ([facility]. [facility] Phenix at [facility]) - ?chronic fungal infection    Past Surgical History  Procedure Laterality Date  . Penile prosthesis implant  2007    Otelin  . Cholecystectomy    . Goiter removal    . Nose surgery  1971  . Hernia repair    . Cardiac catheterization  06/22/04    Severe, nonischemic cardiomyopathy,EF 25-30%  . Biv-aicd implant      [employer]  . Colonoscopy  10/99    Divertic, splenic, hepatic fleure only  . Esophagogastroduodenoscopy  01/1998    stricture, sliding HH, GERD  . Esophageal dilation  02/18/98  . Esophagogastroduodenoscopy  04/08/01    stricture, gastritis, HH, GERD-no dilation(Dr. Henrene Pastor)  . Hernia repair  01/30/02    Dr. Daphine Deutscher  . Pulmonary eval  04/2002    ([facility]) Chronic congestive symptoms  . US echocardiography  07/13/04    EF 25-30%, Mod LVH; LA severe dilation; Mild AR; IRTR  . Esophagogastroduodenoscopy  12/22/04    stricture; gastritis; duodenitis, GERD  . US echocardiography  11/27/06    hypokinesis posterior wall, EF 35%; mild AR  . Esophagogastroduodenoscopy  10/21/08    Reflux esophagitis; Erythem. Duod. (Dr. Mechele Collin)  . Colonoscopy  10/21/08    aborted-divertics, int hemms (Dr. Mechele Collin)  . Cardioversion  02/22/09    AFlutter-([facility])  . Coronary angioplasty    . Back surgery  1997    bulging disks    Relevant past medical, surgical, family and social history reviewed and updated as indicated.    Allergies and medications reviewed and updated. Current Outpatient Prescriptions on File Prior to Visit  Medication Sig  . carvedilol (COREG) 6.25 MG tablet Take 1 tablet (6.25 mg total) by mouth 2 (two) times daily with a meal.  . diazepam (VALIUM) 5 MG tablet TAKE 1/4 TABLET EVERY EVENING AT BEDTIME AS NEEDED  . enalapril (VASOTEC) 5 MG tablet Take 1 tablet (5 mg total) by mouth 2 (two) times daily.  . flunisolide (NASAREL) 29 MCG/ACT (0.025%) nasal spray 2 sprays by Nasal route daily. Dose is for each nostril.   . Fluticasone-Salmeterol (ADVAIR DISKUS) 100-50 MCG/DOSE AEPB Inhale 1 puff into the lungs as needed.   . furosemide (LASIX) 20 MG tablet Take 0.5 tablets (10 mg total) by mouth daily.  . Loratadine 10 MG CAPS Take 1 capsule by mouth as needed.   . montelukast (SINGULAIR) 10 MG tablet Take 10 mg by mouth daily as needed.    . sucralfate (CARAFATE) 1 G tablet TAKE 1/2 A TABLET BY MOUTH AS NEEDED  . warfarin (COUMADIN) 5 MG tablet Take as directed by the anticoagulation clinic   No current facility-administered medications on file prior to visit.    Review of Systems Per HPI unless specifically indicated above    Objective:    BP 112/78  Pulse 60  Temp(Src) 97.6 F (36.4 C) (Oral)  Wt 137 lb 12 oz (62.483 kg)  SpO2 99%  Physical Exam  Nursing note and vitals reviewed. Constitutional: He appears well-developed and well-nourished. No distress.  HENT:  Head: Normocephalic and atraumatic.  Right Ear: Hearing, tympanic membrane, external ear and ear canal normal.  Left Ear: Hearing, tympanic membrane, external ear and ear canal normal.  Nose: Nose normal. No mucosal edema or rhinorrhea. Right sinus exhibits no maxillary sinus tenderness and no frontal sinus tenderness. Left sinus exhibits no maxillary sinus tenderness and no frontal sinus tenderness.  Mouth/Throat: Uvula is midline, oropharynx is clear and moist and mucous membranes are normal. No oropharyngeal exudate,  posterior oropharyngeal edema, posterior oropharyngeal erythema or tonsillar abscesses.  Eyes: Conjunctivae and EOM are normal. Pupils are equal, round, and reactive to light. No scleral icterus.  Neck: Normal range of motion. Neck supple.  Cardiovascular: Normal rate, regular rhythm, normal heart sounds and intact distal pulses.   No murmur heard. Pulmonary/Chest: Effort normal and breath sounds normal. No respiratory distress. He has no wheezes. He has no rales.  Abdominal: Soft. Normal appearance and bowel sounds are normal. He exhibits no distension and no mass. There is no hepatosplenomegaly. There is tenderness (mild) in the epigastric area. There is no rigidity, no rebound, no guarding, no CVA tenderness and negative Murphy's sign.  Lymphadenopathy:    He  has no cervical adenopathy.  Skin: Skin is warm and dry. No rash noted.   Results for orders placed in visit on 08/17/13  POCT URINALYSIS DIPSTICK      Result Value Ref Range   Color, UA Amber     Clarity, UA Clear     Glucose, UA Negative     Bilirubin, UA Negative     Ketones, UA Negative     Spec Grav, UA <=1.005     Blood, UA Negative     pH, UA 7.0     Protein, UA Trace     Urobilinogen, UA 0.2     Nitrite, UA Negative     Leukocytes, UA Negative        Assessment & Plan:  zofran $Remov'4mg'oVepbf$  ODT led to symptomatic improvement of nausea.  Problem List Items Addressed This Visit   Malaise - Primary     I spoke with Dr. Rich Reining [facility] pulmonologist to touch base about patient.  Thought possible chronic fungal pulmonary infection - pending further evaluation with CT maxillofacial and lung.  Unclear etiology of current sxs, but he does not think related to current pulmonary process.  I don't think this is cardiac related either.  ?viral gastritis vs other. Responded to zofran ODT in office - will send home with zofran $RemoveBef'4mg'AesaLjoYVI$  tab to take prn nausea.  Encouraged continued small sips and clear fluids. Will further evaluate with blood  work (CMP, lipase, CBC, CRP and ESR) and UA today UA - tr protein otherwise normal. If worsening malaise, nausea, or new symptoms of abdominal pain or fever, recommended ER evaluation. If not improving with above treatment, pt will call Dr. Dorthula Matas office for sooner evaluation than next scheduled appt of 08/28/2013.    Relevant Orders      Comprehensive metabolic panel      CBC with Differential      Sedimentation rate      High sensitivity CRP      Lipase      POCT Urinalysis Dipstick (Completed)    Other Visit Diagnoses   Nausea with vomiting        Relevant Medications       ondansetron (ZOFRAN-ODT) disintegrating tablet 4 mg (Completed)    Other Relevant Orders       High sensitivity CRP    Nausea alone        Relevant Orders       Comprehensive metabolic panel       CBC with Differential       Sedimentation rate       High sensitivity CRP       Lipase       POCT Urinalysis Dipstick (Completed)    Dry heaves            Follow up plan: Return if symptoms worsen or fail to improve.

## 2013-08-18 ENCOUNTER — Telehealth: Payer: Self-pay | Admitting: *Deleted

## 2013-08-18 ENCOUNTER — Emergency Department: Payer: Self-pay | Admitting: Emergency Medicine

## 2013-08-18 ENCOUNTER — Encounter: Payer: Medicare Other | Admitting: Family Medicine

## 2013-08-18 DIAGNOSIS — Z9889 Other specified postprocedural states: Secondary | ICD-10-CM | POA: Diagnosis not present

## 2013-08-18 DIAGNOSIS — R4182 Altered mental status, unspecified: Secondary | ICD-10-CM | POA: Diagnosis not present

## 2013-08-18 DIAGNOSIS — I10 Essential (primary) hypertension: Secondary | ICD-10-CM | POA: Diagnosis not present

## 2013-08-18 DIAGNOSIS — R112 Nausea with vomiting, unspecified: Secondary | ICD-10-CM | POA: Diagnosis not present

## 2013-08-18 DIAGNOSIS — J449 Chronic obstructive pulmonary disease, unspecified: Secondary | ICD-10-CM | POA: Diagnosis not present

## 2013-08-18 DIAGNOSIS — I4891 Unspecified atrial fibrillation: Secondary | ICD-10-CM | POA: Diagnosis not present

## 2013-08-18 DIAGNOSIS — R9389 Abnormal findings on diagnostic imaging of other specified body structures: Secondary | ICD-10-CM | POA: Diagnosis not present

## 2013-08-18 DIAGNOSIS — I6350 Cerebral infarction due to unspecified occlusion or stenosis of unspecified cerebral artery: Secondary | ICD-10-CM | POA: Diagnosis not present

## 2013-08-18 DIAGNOSIS — Z950 Presence of cardiac pacemaker: Secondary | ICD-10-CM | POA: Diagnosis not present

## 2013-08-18 DIAGNOSIS — R5383 Other fatigue: Secondary | ICD-10-CM | POA: Diagnosis not present

## 2013-08-18 DIAGNOSIS — G9389 Other specified disorders of brain: Secondary | ICD-10-CM | POA: Diagnosis not present

## 2013-08-18 DIAGNOSIS — R5381 Other malaise: Secondary | ICD-10-CM | POA: Diagnosis not present

## 2013-08-18 DIAGNOSIS — E860 Dehydration: Secondary | ICD-10-CM | POA: Diagnosis not present

## 2013-08-18 DIAGNOSIS — Z7901 Long term (current) use of anticoagulants: Secondary | ICD-10-CM | POA: Diagnosis not present

## 2013-08-18 DIAGNOSIS — Z5181 Encounter for therapeutic drug level monitoring: Secondary | ICD-10-CM | POA: Diagnosis not present

## 2013-08-18 DIAGNOSIS — Z79899 Other long term (current) drug therapy: Secondary | ICD-10-CM | POA: Diagnosis not present

## 2013-08-18 DIAGNOSIS — I6789 Other cerebrovascular disease: Secondary | ICD-10-CM | POA: Diagnosis not present

## 2013-08-18 LAB — CBC WITH DIFFERENTIAL/PLATELET
Basophil #: 0.1 10*3/uL (ref 0.0–0.1)
Basophil %: 0.7 %
Eosinophil #: 0.1 10*3/uL (ref 0.0–0.7)
Eosinophil %: 0.9 %
HCT: 41.6 % (ref 40.0–52.0)
HGB: 14.4 g/dL (ref 13.0–18.0)
Lymphocyte #: 1.8 10*3/uL (ref 1.0–3.6)
Lymphocyte %: 24.7 %
MCH: 29.4 pg (ref 26.0–34.0)
MCHC: 34.6 g/dL (ref 32.0–36.0)
MCV: 85 fL (ref 80–100)
Monocyte #: 0.6 x10 3/mm (ref 0.2–1.0)
Monocyte %: 8.1 %
Neutrophil #: 4.7 10*3/uL (ref 1.4–6.5)
Neutrophil %: 65.6 %
Platelet: 161 10*3/uL (ref 150–440)
RBC: 4.89 10*6/uL (ref 4.40–5.90)
RDW: 15.6 % — ABNORMAL HIGH (ref 11.5–14.5)
WBC: 7.2 10*3/uL (ref 3.8–10.6)

## 2013-08-18 LAB — BASIC METABOLIC PANEL
Anion Gap: 6 — ABNORMAL LOW (ref 7–16)
BUN: 14 mg/dL (ref 7–18)
Calcium, Total: 9.4 mg/dL (ref 8.5–10.1)
Chloride: 97 mmol/L — ABNORMAL LOW (ref 98–107)
Co2: 28 mmol/L (ref 21–32)
Creatinine: 1.25 mg/dL (ref 0.60–1.30)
EGFR (African American): >60
EGFR (Non-African Amer.): 54 — ABNORMAL LOW
Glucose: 101 mg/dL — ABNORMAL HIGH (ref 65–99)
Osmolality: 263 (ref 275–301)
Potassium: 4.4 mmol/L (ref 3.5–5.1)
Sodium: 131 mmol/L — ABNORMAL LOW (ref 136–145)

## 2013-08-18 LAB — PROTIME-INR
INR: 3.1
Prothrombin Time: 30.8 secs — ABNORMAL HIGH (ref 11.5–14.7)

## 2013-08-18 LAB — URINALYSIS, COMPLETE
Bacteria: NONE SEEN
Bilirubin,UR: NEGATIVE
Glucose,UR: NEGATIVE mg/dL (ref 0–75)
Hyaline Cast: 2
Leukocyte Esterase: NEGATIVE
Nitrite: NEGATIVE
Ph: 5 (ref 4.5–8.0)
Protein: NEGATIVE
RBC,UR: 2 /HPF (ref 0–5)
Specific Gravity: 1.021 (ref 1.003–1.030)
Squamous Epithelial: <1
WBC UR: 1 /HPF (ref 0–5)

## 2013-08-18 LAB — TROPONIN I: Troponin-I: <0.02 ng/mL

## 2013-08-18 LAB — MAGNESIUM: Magnesium: 1.7 mg/dL — ABNORMAL LOW

## 2013-08-18 NOTE — Telephone Encounter (Signed)
Patient's sister Bertram Millard, called and left message that [facility] said they couldn't help patient based on your office note from yesterday. They said there was nothing more they could do for him. Her message asked what else could be for him since he was still sick and they were desperate to help him. I called back and got her voicemail. I advised that your original advice was to go to the ER instead of [facility] and that is what he needed to do. I advised that if he didn't feel that he could make it to [facility], then he should go to [facility], since he was already in the parking lot [facility] and [facility] share the same parking lot area) to get him some help sooner.

## 2013-08-18 NOTE — Telephone Encounter (Signed)
No answer at home. Called cell - spoke with patient - eval at [facility] - possible stroke.  To be transferred to [facility]. Appreciative of the call.

## 2013-08-19 ENCOUNTER — Telehealth: Payer: Self-pay | Admitting: *Deleted

## 2013-08-19 NOTE — Telephone Encounter (Signed)
Spouse calls inform us that pt went to [facility] yesterday and she states he was told his INR was 5 something. Accessed care everywhere records and INR was 5.0 at [facility]. Staff at [facility] office checked [facility] records and INR yesterday was 3.1. Spouse states pt is not eating well and is not on any new med except PRN Phenergan Supp and or Zofran sublingual. Pt did skip dose of Coumadin yesterday, thus was instructed to resume his current dose from last [facility] visit on 07/29/13, keep appt for INR on Wednesday. Call for any other questions or other concerns.

## 2013-08-20 DIAGNOSIS — I10 Essential (primary) hypertension: Secondary | ICD-10-CM | POA: Diagnosis not present

## 2013-08-20 DIAGNOSIS — R112 Nausea with vomiting, unspecified: Secondary | ICD-10-CM | POA: Diagnosis not present

## 2013-08-20 DIAGNOSIS — E860 Dehydration: Secondary | ICD-10-CM | POA: Diagnosis not present

## 2013-08-20 DIAGNOSIS — J449 Chronic obstructive pulmonary disease, unspecified: Secondary | ICD-10-CM | POA: Diagnosis not present

## 2013-08-20 DIAGNOSIS — R1115 Cyclical vomiting syndrome unrelated to migraine: Secondary | ICD-10-CM | POA: Diagnosis not present

## 2013-08-20 DIAGNOSIS — I4891 Unspecified atrial fibrillation: Secondary | ICD-10-CM | POA: Diagnosis not present

## 2013-08-20 LAB — CBC
HCT: 39.9 % — ABNORMAL LOW (ref 40.0–52.0)
HGB: 13.9 g/dL (ref 13.0–18.0)
MCH: 29.1 pg (ref 26.0–34.0)
MCHC: 34.8 g/dL (ref 32.0–36.0)
MCV: 84 fL (ref 80–100)
Platelet: 154 10*3/uL (ref 150–440)
RBC: 4.77 10*6/uL (ref 4.40–5.90)
RDW: 15.6 % — ABNORMAL HIGH (ref 11.5–14.5)
WBC: 6.8 10*3/uL (ref 3.8–10.6)

## 2013-08-20 LAB — URINALYSIS, COMPLETE
Bacteria: NONE SEEN
Bilirubin,UR: NEGATIVE
Glucose,UR: NEGATIVE mg/dL (ref 0–75)
Leukocyte Esterase: NEGATIVE
Nitrite: NEGATIVE
Ph: 7 (ref 4.5–8.0)
Protein: NEGATIVE
RBC,UR: <1 /HPF (ref 0–5)
Specific Gravity: 1.01 (ref 1.003–1.030)
Squamous Epithelial: <1
WBC UR: <1 /HPF (ref 0–5)

## 2013-08-20 LAB — COMPREHENSIVE METABOLIC PANEL
Albumin: 3.6 g/dL (ref 3.4–5.0)
Alkaline Phosphatase: 62 U/L
Anion Gap: 7 (ref 7–16)
BUN: 14 mg/dL (ref 7–18)
Bilirubin,Total: 1.3 mg/dL — ABNORMAL HIGH (ref 0.2–1.0)
Calcium, Total: 8.7 mg/dL (ref 8.5–10.1)
Chloride: 97 mmol/L — ABNORMAL LOW (ref 98–107)
Co2: 26 mmol/L (ref 21–32)
Creatinine: 1.05 mg/dL (ref 0.60–1.30)
EGFR (African American): >60
EGFR (Non-African Amer.): >60
Glucose: 95 mg/dL (ref 65–99)
Osmolality: 261 (ref 275–301)
Potassium: 3.8 mmol/L (ref 3.5–5.1)
SGOT(AST): 22 U/L (ref 15–37)
SGPT (ALT): 21 U/L (ref 12–78)
Sodium: 130 mmol/L — ABNORMAL LOW (ref 136–145)
Total Protein: 6.5 g/dL (ref 6.4–8.2)

## 2013-08-20 LAB — PROTIME-INR
INR: 2.8
Prothrombin Time: 28.8 secs — ABNORMAL HIGH (ref 11.5–14.7)

## 2013-08-20 LAB — LIPASE, BLOOD: Lipase: 182 U/L (ref 73–393)

## 2013-08-20 LAB — CK TOTAL AND CKMB (NOT AT ARMC)
CK, Total: 58 U/L
CK-MB: 3.7 ng/mL — ABNORMAL HIGH (ref 0.5–3.6)

## 2013-08-20 LAB — TROPONIN I: Troponin-I: <0.02 ng/mL

## 2013-08-20 NOTE — Telephone Encounter (Signed)
plz obtain ER records from [place] and [facility]. Spoke with wife - continued nausea.  Treated with phenergan and zofran.  Phenergan 12.5mg  helps but knocks him out and may cause visual hallucinations.  Zofran ODT not controlling nausea either.  Not eating or drinking. Neurology ruled out stroke. rec return to ER for intractable nausea, unable to keep food and fluids down.

## 2013-08-20 NOTE — Telephone Encounter (Signed)
[facility] notes in your IN box for review. Still waiting on records from [facility].

## 2013-08-20 NOTE — Telephone Encounter (Signed)
Pt was not admitted to [facility]; pt continues with nausea and dry heaves, pt has not eaten since 08/15/13 and only drinks sips of gatorade, Mrs Bastin does not know if pt needs to be seen or what to do; wants Dr Synthia Innocent opinion before proceeds. Mrs Schauer thinks he needs admission.

## 2013-08-20 NOTE — Telephone Encounter (Signed)
Records requested

## 2013-08-21 DIAGNOSIS — K921 Melena: Secondary | ICD-10-CM | POA: Diagnosis not present

## 2013-08-21 DIAGNOSIS — Z7902 Long term (current) use of antithrombotics/antiplatelets: Secondary | ICD-10-CM | POA: Diagnosis not present

## 2013-08-21 DIAGNOSIS — I4891 Unspecified atrial fibrillation: Secondary | ICD-10-CM | POA: Diagnosis not present

## 2013-08-21 DIAGNOSIS — R1115 Cyclical vomiting syndrome unrelated to migraine: Secondary | ICD-10-CM | POA: Diagnosis not present

## 2013-08-21 DIAGNOSIS — J449 Chronic obstructive pulmonary disease, unspecified: Secondary | ICD-10-CM | POA: Diagnosis not present

## 2013-08-21 DIAGNOSIS — I10 Essential (primary) hypertension: Secondary | ICD-10-CM | POA: Diagnosis not present

## 2013-08-21 LAB — BASIC METABOLIC PANEL
Anion Gap: 7 (ref 7–16)
BUN: 12 mg/dL (ref 7–18)
Calcium, Total: 8.1 mg/dL — ABNORMAL LOW (ref 8.5–10.1)
Chloride: 106 mmol/L (ref 98–107)
Co2: 24 mmol/L (ref 21–32)
Creatinine: 0.99 mg/dL (ref 0.60–1.30)
EGFR (African American): >60
EGFR (Non-African Amer.): >60
Glucose: 87 mg/dL (ref 65–99)
Osmolality: 273 (ref 275–301)
Potassium: 4 mmol/L (ref 3.5–5.1)
Sodium: 137 mmol/L (ref 136–145)

## 2013-08-21 LAB — CBC WITH DIFFERENTIAL/PLATELET
Basophil #: 0.1 10*3/uL (ref 0.0–0.1)
Basophil %: 1.4 %
Eosinophil #: 0.1 10*3/uL (ref 0.0–0.7)
Eosinophil %: 2.2 %
HCT: 35.1 % — ABNORMAL LOW (ref 40.0–52.0)
HGB: 12.1 g/dL — ABNORMAL LOW (ref 13.0–18.0)
Lymphocyte #: 1.1 10*3/uL (ref 1.0–3.6)
Lymphocyte %: 22.8 %
MCH: 29 pg (ref 26.0–34.0)
MCHC: 34.6 g/dL (ref 32.0–36.0)
MCV: 84 fL (ref 80–100)
Monocyte #: 0.4 x10 3/mm (ref 0.2–1.0)
Monocyte %: 8.4 %
Neutrophil #: 3.2 10*3/uL (ref 1.4–6.5)
Neutrophil %: 65.2 %
Platelet: 117 10*3/uL — ABNORMAL LOW (ref 150–440)
RBC: 4.2 10*6/uL — ABNORMAL LOW (ref 4.40–5.90)
RDW: 15.6 % — ABNORMAL HIGH (ref 11.5–14.5)
WBC: 5 10*3/uL (ref 3.8–10.6)

## 2013-08-22 ENCOUNTER — Inpatient Hospital Stay: Payer: Self-pay | Admitting: Internal Medicine

## 2013-08-22 DIAGNOSIS — J449 Chronic obstructive pulmonary disease, unspecified: Secondary | ICD-10-CM | POA: Diagnosis not present

## 2013-08-22 DIAGNOSIS — K2990 Gastroduodenitis, unspecified, without bleeding: Secondary | ICD-10-CM | POA: Diagnosis not present

## 2013-08-22 DIAGNOSIS — Z7902 Long term (current) use of antithrombotics/antiplatelets: Secondary | ICD-10-CM | POA: Diagnosis not present

## 2013-08-22 DIAGNOSIS — Z8673 Personal history of transient ischemic attack (TIA), and cerebral infarction without residual deficits: Secondary | ICD-10-CM | POA: Diagnosis not present

## 2013-08-22 DIAGNOSIS — I428 Other cardiomyopathies: Secondary | ICD-10-CM | POA: Diagnosis not present

## 2013-08-22 DIAGNOSIS — R066 Hiccough: Secondary | ICD-10-CM | POA: Diagnosis not present

## 2013-08-22 DIAGNOSIS — I359 Nonrheumatic aortic valve disorder, unspecified: Secondary | ICD-10-CM | POA: Diagnosis not present

## 2013-08-22 DIAGNOSIS — K449 Diaphragmatic hernia without obstruction or gangrene: Secondary | ICD-10-CM | POA: Diagnosis not present

## 2013-08-22 DIAGNOSIS — K3189 Other diseases of stomach and duodenum: Secondary | ICD-10-CM | POA: Diagnosis not present

## 2013-08-22 DIAGNOSIS — I5022 Chronic systolic (congestive) heart failure: Secondary | ICD-10-CM | POA: Diagnosis not present

## 2013-08-22 DIAGNOSIS — E860 Dehydration: Secondary | ICD-10-CM | POA: Diagnosis present

## 2013-08-22 DIAGNOSIS — Z72820 Sleep deprivation: Secondary | ICD-10-CM | POA: Diagnosis not present

## 2013-08-22 DIAGNOSIS — Z95810 Presence of automatic (implantable) cardiac defibrillator: Secondary | ICD-10-CM | POA: Diagnosis not present

## 2013-08-22 DIAGNOSIS — K5900 Constipation, unspecified: Secondary | ICD-10-CM | POA: Diagnosis not present

## 2013-08-22 DIAGNOSIS — J9819 Other pulmonary collapse: Secondary | ICD-10-CM | POA: Diagnosis not present

## 2013-08-22 DIAGNOSIS — F411 Generalized anxiety disorder: Secondary | ICD-10-CM | POA: Diagnosis present

## 2013-08-22 DIAGNOSIS — Z880 Allergy status to penicillin: Secondary | ICD-10-CM | POA: Diagnosis not present

## 2013-08-22 DIAGNOSIS — J4489 Other specified chronic obstructive pulmonary disease: Secondary | ICD-10-CM | POA: Diagnosis not present

## 2013-08-22 DIAGNOSIS — K219 Gastro-esophageal reflux disease without esophagitis: Secondary | ICD-10-CM | POA: Diagnosis present

## 2013-08-22 DIAGNOSIS — R112 Nausea with vomiting, unspecified: Secondary | ICD-10-CM | POA: Diagnosis not present

## 2013-08-22 DIAGNOSIS — E43 Unspecified severe protein-calorie malnutrition: Secondary | ICD-10-CM | POA: Diagnosis not present

## 2013-08-22 DIAGNOSIS — K319 Disease of stomach and duodenum, unspecified: Secondary | ICD-10-CM | POA: Diagnosis present

## 2013-08-22 DIAGNOSIS — J328 Other chronic sinusitis: Secondary | ICD-10-CM | POA: Diagnosis not present

## 2013-08-22 DIAGNOSIS — Z79899 Other long term (current) drug therapy: Secondary | ICD-10-CM | POA: Diagnosis not present

## 2013-08-22 DIAGNOSIS — R1013 Epigastric pain: Secondary | ICD-10-CM | POA: Diagnosis not present

## 2013-08-22 DIAGNOSIS — K2940 Chronic atrophic gastritis without bleeding: Secondary | ICD-10-CM | POA: Diagnosis not present

## 2013-08-22 DIAGNOSIS — Z888 Allergy status to other drugs, medicaments and biological substances status: Secondary | ICD-10-CM | POA: Diagnosis not present

## 2013-08-22 DIAGNOSIS — K2970 Gastritis, unspecified, without bleeding: Secondary | ICD-10-CM | POA: Diagnosis present

## 2013-08-22 DIAGNOSIS — R918 Other nonspecific abnormal finding of lung field: Secondary | ICD-10-CM | POA: Diagnosis not present

## 2013-08-22 DIAGNOSIS — R1115 Cyclical vomiting syndrome unrelated to migraine: Secondary | ICD-10-CM | POA: Diagnosis not present

## 2013-08-22 DIAGNOSIS — R51 Headache: Secondary | ICD-10-CM | POA: Diagnosis not present

## 2013-08-22 DIAGNOSIS — I4891 Unspecified atrial fibrillation: Secondary | ICD-10-CM | POA: Diagnosis not present

## 2013-08-22 DIAGNOSIS — J90 Pleural effusion, not elsewhere classified: Secondary | ICD-10-CM | POA: Diagnosis not present

## 2013-08-22 DIAGNOSIS — Z8711 Personal history of peptic ulcer disease: Secondary | ICD-10-CM | POA: Diagnosis not present

## 2013-08-22 DIAGNOSIS — I509 Heart failure, unspecified: Secondary | ICD-10-CM | POA: Diagnosis not present

## 2013-08-22 DIAGNOSIS — I10 Essential (primary) hypertension: Secondary | ICD-10-CM | POA: Diagnosis not present

## 2013-08-22 DIAGNOSIS — R1110 Vomiting, unspecified: Secondary | ICD-10-CM | POA: Diagnosis not present

## 2013-08-22 DIAGNOSIS — Z452 Encounter for adjustment and management of vascular access device: Secondary | ICD-10-CM | POA: Diagnosis not present

## 2013-08-22 DIAGNOSIS — K3184 Gastroparesis: Secondary | ICD-10-CM | POA: Diagnosis present

## 2013-08-22 LAB — CBC WITH DIFFERENTIAL/PLATELET
Basophil #: 0.1 10*3/uL (ref 0.0–0.1)
Basophil %: 1.2 %
Eosinophil #: 0.2 10*3/uL (ref 0.0–0.7)
Eosinophil %: 2.9 %
HCT: 33.4 % — ABNORMAL LOW (ref 40.0–52.0)
HGB: 11.6 g/dL — ABNORMAL LOW (ref 13.0–18.0)
Lymphocyte #: 1.2 10*3/uL (ref 1.0–3.6)
Lymphocyte %: 23.5 %
MCH: 29.1 pg (ref 26.0–34.0)
MCHC: 34.6 g/dL (ref 32.0–36.0)
MCV: 84 fL (ref 80–100)
Monocyte #: 0.4 x10 3/mm (ref 0.2–1.0)
Monocyte %: 8.3 %
Neutrophil #: 3.3 10*3/uL (ref 1.4–6.5)
Neutrophil %: 64.1 %
Platelet: 114 10*3/uL — ABNORMAL LOW (ref 150–440)
RBC: 3.97 10*6/uL — ABNORMAL LOW (ref 4.40–5.90)
RDW: 15.7 % — ABNORMAL HIGH (ref 11.5–14.5)
WBC: 5.2 10*3/uL (ref 3.8–10.6)

## 2013-08-22 LAB — PROTIME-INR
INR: 2.8
Prothrombin Time: 29 secs — ABNORMAL HIGH (ref 11.5–14.7)

## 2013-08-23 LAB — PROTIME-INR
INR: 2.9
Prothrombin Time: 29.5 secs — ABNORMAL HIGH (ref 11.5–14.7)

## 2013-08-24 LAB — PROTIME-INR
INR: 1.6
Prothrombin Time: 19 secs — ABNORMAL HIGH (ref 11.5–14.7)

## 2013-08-24 NOTE — Telephone Encounter (Signed)
Reviewed [place] records from 08/18/2013- transferred to [facility] where he was discharged Mg low, INR 3.1, WBC 7.2, Hgb 14.4, Cr 1.25, Na 131. CT head - ?recent infarct R anterior basal ganglia region but after eval at [facility] did not think acute CVA causative of sxs.

## 2013-08-25 LAB — CREATININE, SERUM
Creatinine: 0.89 mg/dL (ref 0.60–1.30)
EGFR (African American): >60
EGFR (Non-African Amer.): >60

## 2013-08-25 LAB — SODIUM: Sodium: 135 mmol/L — ABNORMAL LOW (ref 136–145)

## 2013-08-25 LAB — POTASSIUM: Potassium: 3.5 mmol/L (ref 3.5–5.1)

## 2013-08-25 LAB — MAGNESIUM: Magnesium: 1.4 mg/dL — ABNORMAL LOW

## 2013-08-25 LAB — PHOSPHORUS: Phosphorus: 2.7 mg/dL (ref 2.5–4.9)

## 2013-08-25 LAB — HEMATOCRIT: HCT: 32.2 % — ABNORMAL LOW (ref 40.0–52.0)

## 2013-08-25 LAB — CALCIUM: Calcium, Total: 8.3 mg/dL — ABNORMAL LOW (ref 8.5–10.1)

## 2013-08-25 LAB — TSH: Thyroid Stimulating Horm: 1.41 u[IU]/mL

## 2013-08-26 ENCOUNTER — Telehealth: Payer: Self-pay

## 2013-08-26 LAB — TPN PANEL
Activated PTT: 28.9 secs (ref 23.6–35.9)
Albumin: 3.2 g/dL — ABNORMAL LOW (ref 3.4–5.0)
Alkaline Phosphatase: 45 U/L
Anion Gap: 7 (ref 7–16)
BUN: 13 mg/dL (ref 7–18)
Calcium, Total: 8 mg/dL — ABNORMAL LOW (ref 8.5–10.1)
Chloride: 101 mmol/L (ref 98–107)
Cholesterol: 103 mg/dL (ref 0–200)
Co2: 26 mmol/L (ref 21–32)
Creatinine: 1.01 mg/dL (ref 0.60–1.30)
EGFR (African American): >60
EGFR (Non-African Amer.): >60
Glucose: 128 mg/dL — ABNORMAL HIGH (ref 65–99)
HGB: 11.9 g/dL — ABNORMAL LOW (ref 13.0–18.0)
INR: 1.3
Magnesium: 2 mg/dL
Osmolality: 270 (ref 275–301)
Phosphorus: 2.2 mg/dL — ABNORMAL LOW (ref 2.5–4.9)
Platelet: 136 10*3/uL — ABNORMAL LOW (ref 150–440)
Potassium: 3 mmol/L — ABNORMAL LOW (ref 3.5–5.1)
Prothrombin Time: 15.6 secs — ABNORMAL HIGH (ref 11.5–14.7)
SGOT(AST): 30 U/L (ref 15–37)
Sodium: 134 mmol/L — ABNORMAL LOW (ref 136–145)
Total Protein: 5.5 g/dL — ABNORMAL LOW (ref 6.4–8.2)
Triglycerides: 102 mg/dL (ref 0–200)
WBC: 6.9 10*3/uL (ref 3.8–10.6)

## 2013-08-26 LAB — PATHOLOGY REPORT

## 2013-08-26 NOTE — Telephone Encounter (Signed)
Pt missed Coumadin follow-up appt this am, called spoke with pt's daughter she states pt is very sick and is currently in the hospital at [facility] with a bowel blockage.  Advised pt's daughter I would cancel today's appt and to have pt's wife call me to reschedule appt once pt is discharged from the hospital. Pt's daughter verbalized understanding.

## 2013-08-27 LAB — BASIC METABOLIC PANEL
Anion Gap: 8 (ref 7–16)
BUN: 13 mg/dL (ref 7–18)
Calcium, Total: 8.1 mg/dL — ABNORMAL LOW (ref 8.5–10.1)
Chloride: 96 mmol/L — ABNORMAL LOW (ref 98–107)
Co2: 28 mmol/L (ref 21–32)
Creatinine: 0.96 mg/dL (ref 0.60–1.30)
EGFR (African American): >60
EGFR (Non-African Amer.): >60
Glucose: 530 mg/dL (ref 65–99)
Osmolality: 289 (ref 275–301)
Potassium: 4.5 mmol/L (ref 3.5–5.1)
Sodium: 132 mmol/L — ABNORMAL LOW (ref 136–145)

## 2013-08-27 LAB — MAGNESIUM: Magnesium: 2 mg/dL

## 2013-08-27 LAB — PHOSPHORUS
Phosphorus: 5.3 mg/dL — ABNORMAL HIGH (ref 2.5–4.9)
Phosphorus: 2.8 mg/dL (ref 2.5–4.9)

## 2013-08-27 LAB — PROTIME-INR
INR: 1.3
Prothrombin Time: 15.9 s — ABNORMAL HIGH

## 2013-08-27 LAB — GLUCOSE, RANDOM: Glucose: 130 mg/dL — ABNORMAL HIGH

## 2013-08-28 ENCOUNTER — Encounter: Payer: Self-pay | Admitting: Cardiovascular Disease

## 2013-08-28 DIAGNOSIS — I5022 Chronic systolic (congestive) heart failure: Secondary | ICD-10-CM

## 2013-08-28 DIAGNOSIS — I509 Heart failure, unspecified: Secondary | ICD-10-CM

## 2013-08-28 DIAGNOSIS — I359 Nonrheumatic aortic valve disorder, unspecified: Secondary | ICD-10-CM

## 2013-08-28 LAB — HEPATIC FUNCTION PANEL A (ARMC)
Albumin: 2.9 g/dL — ABNORMAL LOW (ref 3.4–5.0)
Alkaline Phosphatase: 34 U/L — ABNORMAL LOW
Bilirubin, Direct: 0.2 mg/dL (ref 0.00–0.20)
Bilirubin,Total: 0.4 mg/dL (ref 0.2–1.0)
SGOT(AST): 27 U/L (ref 15–37)
SGPT (ALT): 20 U/L (ref 12–78)
Total Protein: 4.9 g/dL — ABNORMAL LOW (ref 6.4–8.2)

## 2013-08-28 LAB — PROTIME-INR
INR: 1.3
Prothrombin Time: 15.8 secs — ABNORMAL HIGH (ref 11.5–14.7)

## 2013-08-28 LAB — SODIUM: Sodium: 136 mmol/L (ref 136–145)

## 2013-08-28 LAB — PHOSPHORUS: Phosphorus: 3.2 mg/dL (ref 2.5–4.9)

## 2013-08-28 LAB — CALCIUM: Calcium, Total: 8.2 mg/dL — ABNORMAL LOW (ref 8.5–10.1)

## 2013-08-28 LAB — MAGNESIUM: Magnesium: 1.9 mg/dL

## 2013-08-28 LAB — POTASSIUM: Potassium: 3.1 mmol/L — ABNORMAL LOW (ref 3.5–5.1)

## 2013-08-29 ENCOUNTER — Encounter: Payer: Self-pay | Admitting: Family Medicine

## 2013-08-29 LAB — CALCIUM: Calcium, Total: 7 mg/dL — CL (ref 8.5–10.1)

## 2013-08-29 LAB — POTASSIUM: Potassium: 3.7 mmol/L (ref 3.5–5.1)

## 2013-08-29 LAB — MAGNESIUM: Magnesium: 1.8 mg/dL

## 2013-08-29 LAB — PHOSPHORUS: Phosphorus: 4.5 mg/dL (ref 2.5–4.9)

## 2013-08-29 LAB — PROTIME-INR
INR: 1.7
Prothrombin Time: 19.6 secs — ABNORMAL HIGH (ref 11.5–14.7)

## 2013-08-29 LAB — SODIUM: Sodium: 134 mmol/L — ABNORMAL LOW (ref 136–145)

## 2013-08-30 DIAGNOSIS — I4891 Unspecified atrial fibrillation: Secondary | ICD-10-CM

## 2013-08-30 LAB — POTASSIUM: Potassium: 3.7 mmol/L (ref 3.5–5.1)

## 2013-08-30 LAB — SODIUM: Sodium: 130 mmol/L — ABNORMAL LOW (ref 136–145)

## 2013-08-30 LAB — PROTIME-INR
INR: 1.7
Prothrombin Time: 19.5 secs — ABNORMAL HIGH (ref 11.5–14.7)

## 2013-08-30 LAB — CALCIUM: Calcium, Total: 8.5 mg/dL (ref 8.5–10.1)

## 2013-08-30 LAB — MAGNESIUM: Magnesium: 1.9 mg/dL

## 2013-08-30 LAB — PHOSPHORUS: Phosphorus: 3.4 mg/dL (ref 2.5–4.9)

## 2013-08-30 LAB — ALBUMIN: Albumin: 3 g/dL — ABNORMAL LOW (ref 3.4–5.0)

## 2013-08-31 LAB — CALCIUM: Calcium, Total: 8.2 mg/dL — ABNORMAL LOW (ref 8.5–10.1)

## 2013-08-31 LAB — POTASSIUM: Potassium: 3.9 mmol/L (ref 3.5–5.1)

## 2013-08-31 LAB — MAGNESIUM: Magnesium: 2 mg/dL

## 2013-08-31 LAB — PROTIME-INR
INR: 1.8
Prothrombin Time: 20.1 secs — ABNORMAL HIGH (ref 11.5–14.7)

## 2013-08-31 LAB — SODIUM: Sodium: 131 mmol/L — ABNORMAL LOW (ref 136–145)

## 2013-08-31 LAB — PHOSPHORUS: Phosphorus: 3.7 mg/dL (ref 2.5–4.9)

## 2013-08-31 LAB — HEMOGLOBIN: HGB: 10.9 g/dL — ABNORMAL LOW (ref 13.0–18.0)

## 2013-09-01 LAB — TPN PANEL
Activated PTT: 30.5 secs (ref 23.6–35.9)
Albumin: 3.1 g/dL — ABNORMAL LOW (ref 3.4–5.0)
Alkaline Phosphatase: 44 U/L — ABNORMAL LOW
Anion Gap: 6 — ABNORMAL LOW (ref 7–16)
BUN: 28 mg/dL — ABNORMAL HIGH (ref 7–18)
Calcium, Total: 8.3 mg/dL — ABNORMAL LOW (ref 8.5–10.1)
Chloride: 99 mmol/L (ref 98–107)
Cholesterol: 115 mg/dL (ref 0–200)
Co2: 29 mmol/L (ref 21–32)
Creatinine: 0.96 mg/dL (ref 0.60–1.30)
EGFR (African American): >60
EGFR (Non-African Amer.): >60
Glucose: 133 mg/dL — ABNORMAL HIGH (ref 65–99)
HGB: 11.9 g/dL — ABNORMAL LOW (ref 13.0–18.0)
INR: 1.9
Magnesium: 2 mg/dL
Osmolality: 276 (ref 275–301)
Phosphorus: 3.4 mg/dL (ref 2.5–4.9)
Platelet: 151 10*3/uL (ref 150–440)
Potassium: 4 mmol/L (ref 3.5–5.1)
Prothrombin Time: 21.5 secs — ABNORMAL HIGH (ref 11.5–14.7)
SGOT(AST): 47 U/L — ABNORMAL HIGH (ref 15–37)
Sodium: 134 mmol/L — ABNORMAL LOW (ref 136–145)
Total Protein: 5.7 g/dL — ABNORMAL LOW (ref 6.4–8.2)
Triglycerides: 120 mg/dL (ref 0–200)
WBC: 7 10*3/uL (ref 3.8–10.6)

## 2013-09-02 ENCOUNTER — Telehealth: Payer: Self-pay | Admitting: *Deleted

## 2013-09-02 NOTE — Telephone Encounter (Signed)
Returned call to pt's wife discharged from hospital yesterday 09/01/13 on Prednisone taper 10mg  4 tablets daily x 7 days, then 3 tablets daily x 7 days, then 2 tablets daily x 7 days, then 1 tablet daily.  Discharge Coumadin instructions say to take 5mg  daily except 10mg  on Mondays and Fridays.  This is a higher dosage than pt has been on in the past with me, and with the additional Prednisone taper pt is presently on instructed pt's wife to resume previous dosage of 5mg  QD except 7.5mg  MF.  Made appt to check INR on 09/04/13 in [place] results called to [facility] office to dose.  INR on 5/26 at [facility] 1.8.  Will await results for INR check on Friday. Wife verbalized understanding.

## 2013-09-02 NOTE — Telephone Encounter (Signed)
Please call his wife regarding his Warfarin.

## 2013-09-04 ENCOUNTER — Ambulatory Visit (INDEPENDENT_AMBULATORY_CARE_PROVIDER_SITE_OTHER): Payer: Medicare Other | Admitting: Cardiovascular Disease

## 2013-09-04 ENCOUNTER — Encounter (INDEPENDENT_AMBULATORY_CARE_PROVIDER_SITE_OTHER): Payer: Medicare Other

## 2013-09-04 DIAGNOSIS — I4892 Unspecified atrial flutter: Secondary | ICD-10-CM

## 2013-09-04 DIAGNOSIS — Z5181 Encounter for therapeutic drug level monitoring: Secondary | ICD-10-CM | POA: Diagnosis not present

## 2013-09-04 DIAGNOSIS — Z7901 Long term (current) use of anticoagulants: Secondary | ICD-10-CM | POA: Diagnosis not present

## 2013-09-04 LAB — POCT INR: INR: 2.1

## 2013-09-08 ENCOUNTER — Ambulatory Visit (INDEPENDENT_AMBULATORY_CARE_PROVIDER_SITE_OTHER): Payer: Medicare Other | Admitting: Family Medicine

## 2013-09-08 ENCOUNTER — Encounter: Payer: Self-pay | Admitting: Family Medicine

## 2013-09-08 VITALS — BP 122/64 | HR 68 | Temp 98.2°F | Wt 139.8 lb

## 2013-09-08 DIAGNOSIS — R5383 Other fatigue: Secondary | ICD-10-CM | POA: Diagnosis not present

## 2013-09-08 DIAGNOSIS — R7309 Other abnormal glucose: Secondary | ICD-10-CM

## 2013-09-08 DIAGNOSIS — R5381 Other malaise: Secondary | ICD-10-CM

## 2013-09-08 DIAGNOSIS — I429 Cardiomyopathy, unspecified: Secondary | ICD-10-CM

## 2013-09-08 DIAGNOSIS — R739 Hyperglycemia, unspecified: Secondary | ICD-10-CM

## 2013-09-08 DIAGNOSIS — I5022 Chronic systolic (congestive) heart failure: Secondary | ICD-10-CM

## 2013-09-08 DIAGNOSIS — K5900 Constipation, unspecified: Secondary | ICD-10-CM | POA: Diagnosis not present

## 2013-09-08 DIAGNOSIS — E46 Unspecified protein-calorie malnutrition: Secondary | ICD-10-CM

## 2013-09-08 NOTE — Progress Notes (Signed)
Pre visit review using our clinic review tool, if applicable. No additional management support is needed unless otherwise documented below in the visit note. 

## 2013-09-08 NOTE — Progress Notes (Addendum)
BP 122/64  Pulse 68  Temp(Src) 98.2 F (36.8 C) (Oral)  Wt 139 lb 12 oz (63.39 kg)   CC: hospital f/u  Subjective:    Patient ID: [name], male    DOB: Dec 26, 1931, 80 y.o.   MRN: [id number]  HPI: [name] is a 80 y.o. male presenting on 09/08/2013 for Follow-up   F/u phone call not completed as I did not have notice from [facility] pt was admitted or discharged.  Recent hospitalization from May ~14-26 for intractable nausea/vomiting due to severe constipation along with hiccups curently treated with baclofen 10d course, I have not received D/C summary but have received paperwork he brings today as well as endoscopy report.  Severe constipation leading to protein calorie malnutrition s/p TPN use complicated by hyperglycemia.  Gastric emptying study showed delayed gastric emptying.  Echo showed severe decrease in EF 25%.  F/u CT chest showed near complete resolution of previously demonstrated bilateral airspace opaicities consistent with resolving pneumonia.  Prednisone was started - unclear reason, pt states due to hiccups. On slow taper. Also on baclofen for this.  Has f/u with Dr. Tiffany Kocher scheduled for Thursday.  Since he's been home, slowly recovering strength. stooling once a day with new daily senna plus regimen. Denies fevers abd pain or nausea/vomiting.  ESOPHAGOGASTRODUODENOSCOPY Date: 08/2013 erythema gastric fundus s/p biopsy, HH ([facility])  Relevant past medical, surgical, family and social history reviewed and updated as indicated.  Allergies and medications reviewed and updated. Current Outpatient Prescriptions on File Prior to Visit  Medication Sig  . carvedilol (COREG) 6.25 MG tablet Take 1 tablet (6.25 mg total) by mouth 2 (two) times daily with a meal.  . enalapril (VASOTEC) 5 MG tablet Take 1 tablet (5 mg total) by mouth 2 (two) times daily.  . flunisolide (NASAREL) 29 MCG/ACT (0.025%) nasal spray 2 sprays by Nasal route daily. Dose is for each nostril.    . Fluticasone-Salmeterol (ADVAIR DISKUS) 100-50 MCG/DOSE AEPB Inhale 1 puff into the lungs as needed.   . furosemide (LASIX) 20 MG tablet Take 0.5 tablets (10 mg total) by mouth daily.  . Loratadine 10 MG CAPS Take 1 capsule by mouth as needed.   . montelukast (SINGULAIR) 10 MG tablet Take 10 mg by mouth daily as needed.    . ondansetron (ZOFRAN) 4 MG tablet Take 1 tablet (4 mg total) by mouth every 6 (six) hours as needed for nausea or vomiting.  . warfarin (COUMADIN) 5 MG tablet Take as directed by the anticoagulation clinic  . diazepam (VALIUM) 5 MG tablet TAKE 1/4 TABLET EVERY EVENING AT BEDTIME AS NEEDED   No current facility-administered medications on file prior to visit.    Review of Systems Per HPI unless specifically indicated above    Objective:    BP 122/64  Pulse 68  Temp(Src) 98.2 F (36.8 C) (Oral)  Wt 139 lb 12 oz (63.39 kg)  Physical Exam  Nursing note and vitals reviewed. Constitutional: He appears well-developed and well-nourished. No distress.  HENT:  Mouth/Throat: Oropharynx is clear and moist. No oropharyngeal exudate.  Cardiovascular: Normal rate, regular rhythm, normal heart sounds and intact distal pulses.   No murmur heard. Pulmonary/Chest: Effort normal and breath sounds normal. No respiratory distress. He has no wheezes. He has no rales.  Abdominal: Soft. Normal appearance and bowel sounds are normal. He exhibits no distension. There is no hepatosplenomegaly. There is no tenderness. There is no rigidity, no rebound, no guarding, no CVA tenderness and negative Murphy's  sign.  Musculoskeletal: He exhibits edema (tr pedal edema).   Results for orders placed in visit on 09/04/13  POCT INR      Result Value Ref Range   INR 2.1        Assessment & Plan:   Problem List Items Addressed This Visit   Protein calorie malnutrition     Recovering from this. Consider checking alb next visit.    Nonischemic cardiomyopathy     Deteriorated EF at latest echo  in hospital - to 25%. Pt not currently fluid overloaded.    RESOLVED: Malaise     Attributed to severe constipation during recent hospitalization, now resolving, along with nausea.    Hyperglycemia     Attributed first to TPN then to current prednisone taper.    Constipation - Primary     Severe constipation leading to intractable nausea/vomiting. S/p cleanout in hospital. Now on senna plus 1 tablet daily. Also found to have delayed gastric emptying. Has f/u planned with GI later this week. I have requested records and will review when they arrive.    Chronic systolic heart failure     Deteriorated by latest echo 08/2013        Follow up plan: Return in about 6 months (around 03/10/2014), or if symptoms worsen or fail to improve, for annual exam, prior fasting for blood work.  ADDENDUM ==> D/C summary addendum received and asked to scan. Will request full discharge summary.........Marland Kitchen Dx intractable nausea, vomiting, hiccups along with chronic afib, systolic CHF with ER 82%, COPD and mod to severe protein-calorie malnutrition along with anxiety Solumedrol was given for hiccups  ADDENDUM 09/10/2013 ==> finally received full D/C summary and asked to scan. Admission 5/14-25/2015. Consults: GI (Dr. Tiffany Kocher), [facility] and ENT (Dr Tami Ribas). Pt could not complete gastric emptying study but initial eval showed delayed gastric emptying. Echo- EF 20-25% with decreased LV function, severely increased LV size, impaired diastolic filling. Solumedrol for possible phrenic nerve irritation. Relief with this and baclofen. Thorazine was stopped 2/2 concern for possible EPS side effects. Was suggested start reglan 2/2 delayed gastric emptying but this was not started in hospital. Will await GI eval this week.

## 2013-09-08 NOTE — Assessment & Plan Note (Signed)
Deteriorated EF at latest echo in hospital - to 25%. Pt not currently fluid overloaded.

## 2013-09-08 NOTE — Patient Instructions (Addendum)
Good to see you today - I'm glad you're feeling better. Continue senna plus one pill daily.  Once you're feeling better - increase fiber in diet, at same time increasing water. Keep appointment with Dr. Tiffany Kocher later this week. Reschedule medicare wellness visit for 4-6 months.

## 2013-09-09 ENCOUNTER — Ambulatory Visit (INDEPENDENT_AMBULATORY_CARE_PROVIDER_SITE_OTHER): Payer: Medicare Other

## 2013-09-09 DIAGNOSIS — I4892 Unspecified atrial flutter: Secondary | ICD-10-CM

## 2013-09-09 DIAGNOSIS — K5909 Other constipation: Secondary | ICD-10-CM | POA: Insufficient documentation

## 2013-09-09 DIAGNOSIS — Z5181 Encounter for therapeutic drug level monitoring: Secondary | ICD-10-CM | POA: Diagnosis not present

## 2013-09-09 DIAGNOSIS — K5900 Constipation, unspecified: Secondary | ICD-10-CM | POA: Insufficient documentation

## 2013-09-09 DIAGNOSIS — E46 Unspecified protein-calorie malnutrition: Secondary | ICD-10-CM | POA: Insufficient documentation

## 2013-09-09 DIAGNOSIS — R739 Hyperglycemia, unspecified: Secondary | ICD-10-CM | POA: Insufficient documentation

## 2013-09-09 LAB — POCT INR: INR: 3.3

## 2013-09-09 NOTE — Assessment & Plan Note (Signed)
Attributed first to TPN then to current prednisone taper.

## 2013-09-09 NOTE — Assessment & Plan Note (Signed)
Recovering from this. Consider checking alb next visit.

## 2013-09-09 NOTE — Assessment & Plan Note (Addendum)
Severe constipation leading to intractable nausea/vomiting. S/p cleanout in hospital. Now on senna plus 1 tablet daily. Also found to have delayed gastric emptying. Has f/u planned with GI later this week. I have requested records and will review when they arrive.

## 2013-09-09 NOTE — Assessment & Plan Note (Signed)
Deteriorated by latest echo 08/2013

## 2013-09-09 NOTE — Assessment & Plan Note (Signed)
Attributed to severe constipation during recent hospitalization, now resolving, along with nausea.

## 2013-09-10 DIAGNOSIS — R1110 Vomiting, unspecified: Secondary | ICD-10-CM | POA: Diagnosis not present

## 2013-09-16 ENCOUNTER — Encounter: Payer: Medicare Other | Admitting: Family Medicine

## 2013-09-16 ENCOUNTER — Encounter: Payer: Self-pay | Admitting: Family Medicine

## 2013-09-18 ENCOUNTER — Encounter: Payer: Self-pay | Admitting: Cardiology

## 2013-09-23 ENCOUNTER — Ambulatory Visit (INDEPENDENT_AMBULATORY_CARE_PROVIDER_SITE_OTHER): Payer: Medicare Other

## 2013-09-23 DIAGNOSIS — I4892 Unspecified atrial flutter: Secondary | ICD-10-CM

## 2013-09-23 DIAGNOSIS — Z7901 Long term (current) use of anticoagulants: Secondary | ICD-10-CM | POA: Diagnosis not present

## 2013-09-23 DIAGNOSIS — Z5181 Encounter for therapeutic drug level monitoring: Secondary | ICD-10-CM | POA: Diagnosis not present

## 2013-09-23 LAB — POCT INR: INR: 3.4

## 2013-09-23 MED ORDER — WARFARIN SODIUM 5 MG PO TABS: ORAL_TABLET | ORAL | Status: DC

## 2013-09-25 ENCOUNTER — Telehealth: Payer: Self-pay | Admitting: Internal Medicine

## 2013-09-25 NOTE — Telephone Encounter (Signed)
Spoke with pt wife and informed her to send a manual transmission. She verbalized understanding.

## 2013-09-25 NOTE — Telephone Encounter (Signed)
New problem   Pt received a letter stating his transmission didn't go through. Please call pt.

## 2013-10-07 HISTORY — PX: OTHER SURGICAL HISTORY: SHX169

## 2013-10-14 ENCOUNTER — Ambulatory Visit (INDEPENDENT_AMBULATORY_CARE_PROVIDER_SITE_OTHER): Payer: Medicare Other

## 2013-10-14 DIAGNOSIS — I4892 Unspecified atrial flutter: Secondary | ICD-10-CM

## 2013-10-14 DIAGNOSIS — Z7901 Long term (current) use of anticoagulants: Secondary | ICD-10-CM

## 2013-10-14 DIAGNOSIS — Z5181 Encounter for therapeutic drug level monitoring: Secondary | ICD-10-CM | POA: Diagnosis not present

## 2013-10-14 LAB — POCT INR: INR: 1.9

## 2013-11-02 ENCOUNTER — Encounter: Payer: Self-pay | Admitting: Internal Medicine

## 2013-11-02 ENCOUNTER — Ambulatory Visit (INDEPENDENT_AMBULATORY_CARE_PROVIDER_SITE_OTHER): Payer: Medicare Other | Admitting: Internal Medicine

## 2013-11-02 ENCOUNTER — Ambulatory Visit (INDEPENDENT_AMBULATORY_CARE_PROVIDER_SITE_OTHER)
Admission: RE | Admit: 2013-11-02 | Discharge: 2013-11-02 | Disposition: A | Discharge status: Home / Self Care | Payer: Medicare Other | Source: Ambulatory Visit | Attending: Internal Medicine | Admitting: Internal Medicine

## 2013-11-02 VITALS — BP 112/80 | HR 68 | Temp 98.3°F | Resp 15 | Wt 142.0 lb

## 2013-11-02 DIAGNOSIS — R059 Cough, unspecified: Secondary | ICD-10-CM

## 2013-11-02 DIAGNOSIS — R05 Cough: Secondary | ICD-10-CM

## 2013-11-02 DIAGNOSIS — J189 Pneumonia, unspecified organism: Secondary | ICD-10-CM | POA: Insufficient documentation

## 2013-11-02 DIAGNOSIS — J984 Other disorders of lung: Secondary | ICD-10-CM | POA: Diagnosis not present

## 2013-11-02 HISTORY — DX: Pneumonia, unspecified organism: J18.9

## 2013-11-02 MED ORDER — AZITHROMYCIN 250 MG PO TABS: ORAL_TABLET | ORAL | Status: DC

## 2013-11-02 NOTE — Progress Notes (Signed)
Pre visit review using our clinic review tool, if applicable. No additional management support is needed unless otherwise documented below in the visit note. 

## 2013-11-02 NOTE — Assessment & Plan Note (Signed)
I see mild infiltrates in both bases--will await the radiology report He can't tolerate levaquin but does okay with z-pak Will try this Decrease coumadin while on this

## 2013-11-02 NOTE — Patient Instructions (Signed)
Please decrease the warfarin (coumadin) to 2.5 mg daily while you are on the azithromycin.

## 2013-11-02 NOTE — Progress Notes (Signed)
Subjective:    Patient ID: [name], male    DOB: [birth year], 80 y.o.   MRN: [id number]  HPI Here with wife  Has been coughing up yellow stuff for a couple of weeks Wheezing last night Sore throat on right also Felt sick last night in general  Mild SOB---he doesn't really think it is different from usual Did do some work with tying tomatoes this AM--got him more dyspneic than usual  No fever Almost had chill last night--- no sweats  Some head congestion No ear pain Does have some PND --causing nausea  Current Outpatient Prescriptions on File Prior to Visit  Medication Sig Dispense Refill  . ALPRAZolam (XANAX) 0.25 MG tablet Take 0.25 mg by mouth 3 (three) times daily as needed for anxiety.      . baclofen (LIORESAL) 5 mg TABS tablet Take 5 mg by mouth 3 (three) times daily. 10 day course      . carvedilol (COREG) 6.25 MG tablet Take 1 tablet (6.25 mg total) by mouth 2 (two) times daily with a meal.  180 tablet  4  . enalapril (VASOTEC) 5 MG tablet Take 1 tablet (5 mg total) by mouth 2 (two) times daily.  180 tablet  4  . flunisolide (NASAREL) 29 MCG/ACT (0.025%) nasal spray 2 sprays by Nasal route daily. Dose is for each nostril.       . Fluticasone-Salmeterol (ADVAIR DISKUS) 100-50 MCG/DOSE AEPB Inhale 1 puff into the lungs as needed.       . furosemide (LASIX) 20 MG tablet Take 0.5 tablets (10 mg total) by mouth daily.  45 tablet  4  . Loratadine 10 MG CAPS Take 1 capsule by mouth as needed.       . montelukast (SINGULAIR) 10 MG tablet Take 10 mg by mouth daily as needed.        . ondansetron (ZOFRAN) 4 MG tablet Take 1 tablet (4 mg total) by mouth every 6 (six) hours as needed for nausea or vomiting.  30 tablet  0  . Sennosides-Docusate Sodium (SENNA PLUS PO) Take 1 capsule by mouth daily.      . sucralfate (CARAFATE) 1 G tablet TAKE 1 TABLET BY MOUTH AS NEEDED      . warfarin (COUMADIN) 5 MG tablet Take as directed by the anticoagulation clinic  135 tablet  1    No current facility-administered medications on file prior to visit.    Allergies  Allergen Reactions  . Clarithromycin Nausea Only  . Famotidine Other (See Comments)    ABD. PAIN  . Omeprazole Diarrhea  . Oxytetracycline     REACTION: BUMPS  . Penicillins     REACTION: SWELLING Amoxicillin ok  . Proton Pump Inhibitors Other (See Comments)    GI upset, diarrhea, gas, bloating  . Ranitidine Diarrhea    Past Medical History  Diagnosis Date  . NICM (nonischemic cardiomyopathy)     Cardiac catheterization March 2006 without coronary disease; echocardiogram August 2008: EF 35%, mild AI, left atrial enlargement  . Atrial flutter     A.  Status post cardioversion; B.  Tikosyn therapy - failed, remains in aflutte  . COPD (chronic obstructive pulmonary disease)   . LBBB (left bundle branch block)   . GERD (gastroesophageal reflux disease) 2010    h/o esophageal stricture with dilation, LA grade C reflux esophagitis by EGD 2010  . Porphyria   . IBS (irritable bowel syndrome)   . BPH (benign prostatic hyperplasia)   . History  of diverticulitis of colon   . Allergic rhinitis     [name]  . Chronic systolic congestive heart failure   . History of GI bleed     Secondary to hemorrhoids  . Extrinsic asthma     [name]  . 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 Lucky Cowboy)  . Multiple pulmonary nodules 06/2013    RUL ([facility]. Adam Phenix at [facility]) - ?chronic fungal infection    Past Surgical History  Procedure Laterality Date  . Penile prosthesis implant  2007    Otelin  . Cholecystectomy    . Goiter removal    . Nose surgery  1971  . Hernia repair    . Cardiac catheterization  06/22/04    Severe, nonischemic cardiomyopathy,EF 25-30%  . Biv-aicd implant      [employer]  . Colonoscopy  10/99    Divertic, splenic, hepatic fleure only  . Esophagogastroduodenoscopy  01/1998    stricture, sliding HH, GERD  . Esophageal dilation  02/18/98  .  Esophagogastroduodenoscopy  04/08/01    stricture, gastritis, HH, GERD-no dilation(Dr. Henrene Pastor)  . Hernia repair  01/30/02    Dr. Hassell Done  . Pulmonary eval  04/2002    ([facility]) Chronic congestive symptoms  . US echocardiography  07/13/04    EF 25-30%, Mod LVH; LA severe dilation; Mild AR; IRTR  . Esophagogastroduodenoscopy  12/22/04    stricture; gastritis; duodenitis, GERD  . US echocardiography  11/27/06    hypokinesis posterior wall, EF 35%; mild AR  . Esophagogastroduodenoscopy  10/21/08    Reflux esophagitis; Erythem. Duod. (Dr. Vira Agar)  . Colonoscopy  10/21/08    aborted-divertics, int hemms (Dr. Vira Agar)  . Cardioversion  02/22/09    AFlutter-([facility])  . Coronary angioplasty    . Back surgery  1997    bulging disks  . Esophagogastroduodenoscopy  08/2013    erythema gastric fundus - minimal gastritis, HH ([facility])    Family History  Problem Relation Age of Onset  . Coronary artery disease Father   . Hypertension Father   . Dysphagia Sister   . Breast cancer    . Ovarian cancer    . Uterine cancer    . Heart failure Mother   . Other Sister     stomach problems  . Other Sister     stomach problems  . Other Sister     stomach problems  . Alcohol abuse Maternal Uncle     History   Social History  . Marital Status: Married    Spouse Name: N/A    Number of Children: 3  . Years of Education: N/A   Occupational History  . retired   .     Social History Main Topics  . Smoking status: Never Smoker   . Smokeless tobacco: Never Used  . Alcohol Use: No  . Drug Use: No  . Sexual Activity: Not on file   Other Topics Concern  . Not on file   Social History Narrative   Lives with wife   3 children-8 grandchildren   Still works on a farm.   Retired from [employer] friend with Dr. Jefm Bryant   No regular exercise   Review of Systems Some headache No vomiting or diarrhea No rash Appetite off today--had been okay    Objective:   Physical Exam   Constitutional: He appears well-developed and well-nourished. No distress.  HENT:  Mouth/Throat: Oropharynx is  clear and moist. No oropharyngeal exudate.  No sinus tenderness TMs normal Mild nasal congestion only but ?bilateral posterior polyps  Neck: Normal range of motion. Neck supple. No thyromegaly present.  Pulmonary/Chest: Effort normal. No respiratory distress. He has no wheezes. He has rales.  Bilateral coarse crackles but also in RUL  Lymphadenopathy:    He has no cervical adenopathy.  Skin: No rash noted.          Assessment & Plan:

## 2013-11-05 ENCOUNTER — Encounter: Payer: Self-pay | Admitting: Family Medicine

## 2013-11-05 DIAGNOSIS — R599 Enlarged lymph nodes, unspecified: Secondary | ICD-10-CM | POA: Diagnosis not present

## 2013-11-05 DIAGNOSIS — R042 Hemoptysis: Secondary | ICD-10-CM | POA: Diagnosis not present

## 2013-11-05 DIAGNOSIS — R0602 Shortness of breath: Secondary | ICD-10-CM | POA: Diagnosis not present

## 2013-11-05 DIAGNOSIS — R918 Other nonspecific abnormal finding of lung field: Secondary | ICD-10-CM | POA: Diagnosis not present

## 2013-11-05 DIAGNOSIS — J90 Pleural effusion, not elsewhere classified: Secondary | ICD-10-CM | POA: Diagnosis not present

## 2013-11-05 DIAGNOSIS — R9389 Abnormal findings on diagnostic imaging of other specified body structures: Secondary | ICD-10-CM | POA: Diagnosis not present

## 2013-11-05 LAB — PULMONARY FUNCTION TEST

## 2013-11-11 ENCOUNTER — Ambulatory Visit (INDEPENDENT_AMBULATORY_CARE_PROVIDER_SITE_OTHER): Payer: Medicare Other

## 2013-11-11 DIAGNOSIS — Z5181 Encounter for therapeutic drug level monitoring: Secondary | ICD-10-CM | POA: Diagnosis not present

## 2013-11-11 DIAGNOSIS — Z7901 Long term (current) use of anticoagulants: Secondary | ICD-10-CM | POA: Diagnosis not present

## 2013-11-11 DIAGNOSIS — I4892 Unspecified atrial flutter: Secondary | ICD-10-CM

## 2013-11-11 LAB — POCT INR: INR: 1.8

## 2013-11-13 ENCOUNTER — Encounter: Payer: Self-pay | Admitting: Cardiology

## 2013-11-13 ENCOUNTER — Ambulatory Visit: Payer: Medicare Other | Admitting: Cardiology

## 2013-11-13 ENCOUNTER — Ambulatory Visit (INDEPENDENT_AMBULATORY_CARE_PROVIDER_SITE_OTHER): Payer: Medicare Other | Admitting: Cardiology

## 2013-11-13 VITALS — BP 122/64 | HR 82 | Ht 66.0 in | Wt 139.4 lb

## 2013-11-13 DIAGNOSIS — I5022 Chronic systolic (congestive) heart failure: Secondary | ICD-10-CM | POA: Diagnosis not present

## 2013-11-13 NOTE — Progress Notes (Signed)
HPI [name] presents for follow up of his cardiomyopathy and atrial flutter.  Unfortunately he has had problems with constipation and was hospitalized.  He is also having ongoing lung process and is being evaluated for this. There was some mild hemoptysis. He was treated with steroids. He is being seen in the [place] pulmonary clinic for this. He denies any weight gain or edema.  He's had no palpitations, presyncope or syncope. He's not describing PND or orthopnea. His weight is actually down.   Allergies  Allergen Reactions  . Clarithromycin Nausea Only  . Famotidine Other (See Comments)    ABD. PAIN  . Omeprazole Diarrhea  . Oxytetracycline     REACTION: BUMPS  . Penicillins     REACTION: SWELLING Amoxicillin ok  . Proton Pump Inhibitors Other (See Comments)    GI upset, diarrhea, gas, bloating  . Ranitidine Diarrhea    Current Outpatient Prescriptions  Medication Sig Dispense Refill  . ALPRAZolam (XANAX) 0.25 MG tablet Take 0.25 mg by mouth 3 (three) times daily as needed for anxiety.      . baclofen (LIORESAL) 5 mg TABS tablet Take 5 mg by mouth 3 (three) times daily. 10 day course      . carvedilol (COREG) 6.25 MG tablet Take 1 tablet (6.25 mg total) by mouth 2 (two) times daily with a meal.  180 tablet  4  . enalapril (VASOTEC) 5 MG tablet Take 1 tablet (5 mg total) by mouth 2 (two) times daily.  180 tablet  4  . flunisolide (NASAREL) 29 MCG/ACT (0.025%) nasal spray 2 sprays by Nasal route daily. Dose is for each nostril.       . Fluticasone-Salmeterol (ADVAIR DISKUS) 100-50 MCG/DOSE AEPB Inhale 1 puff into the lungs as needed.       . furosemide (LASIX) 20 MG tablet Take 0.5 tablets (10 mg total) by mouth daily.  45 tablet  4  . levofloxacin (LEVAQUIN) 500 MG tablet Take 1 tablet by mouth daily.      . Loratadine 10 MG CAPS Take 1 capsule by mouth as needed.       . montelukast (SINGULAIR) 10 MG tablet Take 10 mg by mouth daily as needed.        . ondansetron  (ZOFRAN) 4 MG tablet Take 1 tablet (4 mg total) by mouth every 6 (six) hours as needed for nausea or vomiting.  30 tablet  0  . Sennosides-Docusate Sodium (SENNA PLUS PO) Take 1 capsule by mouth daily.      . sucralfate (CARAFATE) 1 G tablet TAKE 1 TABLET BY MOUTH AS NEEDED      . warfarin (COUMADIN) 5 MG tablet Take as directed by the anticoagulation clinic  135 tablet  1   No current facility-administered medications for this visit.    Past Medical History  Diagnosis Date  . NICM (nonischemic cardiomyopathy)     Cardiac catheterization March 2006 without coronary disease; echocardiogram August 2008: EF 35%, mild AI, left atrial enlargement  . Atrial flutter     A.  Status post cardioversion; B.  Tikosyn therapy - failed, remains in aflutte  . COPD (chronic obstructive pulmonary disease)   . LBBB (left bundle branch block)   . GERD (gastroesophageal reflux disease) 2010    h/o esophageal stricture with dilation, LA grade C reflux esophagitis by EGD 2010  . Porphyria   . IBS (irritable bowel syndrome)   . BPH (benign prostatic hyperplasia)   . History of diverticulitis  of colon   . Allergic rhinitis     [name]  . Chronic systolic congestive heart failure   . History of GI bleed     Secondary to hemorrhoids  . Extrinsic asthma     [name]  . 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 Lucky Cowboy)  . Multiple pulmonary nodules 06/2013    RUL (Dr. Adam Phenix at [facility]) - ?chronic fungal infection    Past Surgical History  Procedure Laterality Date  . Penile prosthesis implant  2007    Otelin  . Cholecystectomy    . Goiter removal    . Nose surgery  1971  . Hernia repair    . Cardiac catheterization  06/22/04    Severe, nonischemic cardiomyopathy,EF 25-30%  . Biv-aicd implant      [employer]  . Colonoscopy  10/99    Divertic, splenic, hepatic fleure only  . Esophagogastroduodenoscopy  01/1998    stricture, sliding HH, GERD  . Esophageal dilation  02/18/98   . Esophagogastroduodenoscopy  04/08/01    stricture, gastritis, HH, GERD-no dilation(Dr. Henrene Pastor)  . Hernia repair  01/30/02    Dr. Hassell Done  . Pulmonary eval  04/2002    ([facility]) Chronic congestive symptoms  . US echocardiography  07/13/04    EF 25-30%, Mod LVH; LA severe dilation; Mild AR; IRTR  . Esophagogastroduodenoscopy  12/22/04    stricture; gastritis; duodenitis, GERD  . US echocardiography  11/27/06    hypokinesis posterior wall, EF 35%; mild AR  . Esophagogastroduodenoscopy  10/21/08    Reflux esophagitis; Erythem. Duod. (Dr. Vira Agar)  . Colonoscopy  10/21/08    aborted-divertics, int hemms (Dr. Vira Agar)  . Cardioversion  02/22/09    AFlutter-([facility])  . Coronary angioplasty    . Back surgery  1997    bulging disks  . Esophagogastroduodenoscopy  08/2013    erythema gastric fundus - minimal gastritis, HH ([facility])  . Pft  10/2013    FVC 61%, FEV1 63%, ratio 0.76    ROS:  As stated in the HPI and negative for all other systems.  PHYSICAL EXAM BP 122/64  Pulse 82  Ht 5\' 6"  (1.676 m)  Wt 139 lb 6.4 oz (63.231 kg)  BMI 22.51 kg/m2 GENERAL:  Well appearing NECK:  No jugular venous distention, waveform within normal limits, carotid upstroke brisk and symmetric, no bruits, no thyromegaly LUNGS:  Diffuse fine crackles CHEST:  Well healed ICD pocket HEART:  PMI not displaced or sustained,S1 and S2 within normal limits, no S3, no clicks, no rubs, no murmurs ABD:  Flat, positive bowel sounds normal in frequency in pitch, no bruits, no rebound, no guarding, no midline pulsatile mass, no hepatomegaly, no splenomegaly EXT:  2 plus pulses throughout, no edema, no cyanosis no clubbing  EKG:  Atrial flutter, demand ventricular pacemaker rate 82.  No change from previous  11/13/2013  ASSESSMENT AND PLAN  Nonischemic cardiomyopathy -  He seems to be euvolemic. I do not suspect heart failure is the etiology of his pulmonary complaints.At this point, no change in therapy is indicated. . No further  cardiovascular testing is indicated.   He will remain on the meds as listed.   Atrial flutter -  The patient tolerates this rhythm and rate control and anticoagulation. We will continue with the meds as listed. He does not want to switch from warfarin.

## 2013-11-13 NOTE — Patient Instructions (Signed)
Your physician recommends that you schedule a follow-up appointment in: 6 months with Dr. Hochrein  

## 2013-11-16 ENCOUNTER — Encounter: Payer: Self-pay | Admitting: Family Medicine

## 2013-11-21 ENCOUNTER — Encounter: Payer: Self-pay | Admitting: Family Medicine

## 2013-11-25 ENCOUNTER — Ambulatory Visit (INDEPENDENT_AMBULATORY_CARE_PROVIDER_SITE_OTHER): Payer: Medicare Other

## 2013-11-25 DIAGNOSIS — Z7901 Long term (current) use of anticoagulants: Secondary | ICD-10-CM | POA: Diagnosis not present

## 2013-11-25 DIAGNOSIS — I4892 Unspecified atrial flutter: Secondary | ICD-10-CM | POA: Diagnosis not present

## 2013-11-25 DIAGNOSIS — Z5181 Encounter for therapeutic drug level monitoring: Secondary | ICD-10-CM

## 2013-11-25 LAB — POCT INR: INR: 1.9

## 2013-12-09 ENCOUNTER — Ambulatory Visit (INDEPENDENT_AMBULATORY_CARE_PROVIDER_SITE_OTHER): Payer: Medicare Other

## 2013-12-09 DIAGNOSIS — Z5181 Encounter for therapeutic drug level monitoring: Secondary | ICD-10-CM

## 2013-12-09 DIAGNOSIS — I4892 Unspecified atrial flutter: Secondary | ICD-10-CM | POA: Diagnosis not present

## 2013-12-09 DIAGNOSIS — Z7901 Long term (current) use of anticoagulants: Secondary | ICD-10-CM

## 2013-12-09 LAB — POCT INR: INR: 2.7

## 2013-12-15 ENCOUNTER — Encounter: Payer: Self-pay | Admitting: Family Medicine

## 2013-12-15 ENCOUNTER — Ambulatory Visit (INDEPENDENT_AMBULATORY_CARE_PROVIDER_SITE_OTHER)
Admission: RE | Admit: 2013-12-15 | Discharge: 2013-12-15 | Disposition: A | Discharge status: Home / Self Care | Payer: Medicare Other | Source: Ambulatory Visit | Attending: Family Medicine | Admitting: Family Medicine

## 2013-12-15 ENCOUNTER — Ambulatory Visit (INDEPENDENT_AMBULATORY_CARE_PROVIDER_SITE_OTHER): Payer: Medicare Other | Admitting: Family Medicine

## 2013-12-15 VITALS — BP 110/62 | HR 68 | Temp 97.8°F | Wt 140.5 lb

## 2013-12-15 DIAGNOSIS — M25519 Pain in unspecified shoulder: Secondary | ICD-10-CM

## 2013-12-15 DIAGNOSIS — M25512 Pain in left shoulder: Secondary | ICD-10-CM

## 2013-12-15 MED ORDER — DOCUSATE SODIUM 100 MG PO CAPS: 100.0000 mg | ORAL_CAPSULE | Freq: Every day | ORAL | Status: DC

## 2013-12-15 NOTE — Progress Notes (Signed)
Pre visit review using our clinic review tool, if applicable. No additional management support is needed unless otherwise documented below in the visit note. 

## 2013-12-15 NOTE — Patient Instructions (Addendum)
Stop senna plus. Start colace (generic docusate) 100mg  daily  L shoulder xray today. I'm suspicious for a torn rotator cuff and will refer you to orthopedics for further evaluation/treatment. May continue tylenol for pain as up to now. Use ice or heating pad whichever soothes shoulder better at night time.

## 2013-12-15 NOTE — Assessment & Plan Note (Addendum)
Persistent L shoulder pain/limitation 3 mo after injury, with evident loss of active but not passive ROM Anticipate partial or full thickness tear of one of RTC tendons, along with bursitis component. Not consistent with adhesive capsulitis Check Xray today to r/o bony injury after fall. Continue tylenol prn pain, avoid NSAIDs given anticoagulation, and refer to ortho for further evaluation. I would be hesitant for surgery given cardiac history but would get cards opinion re this if needed.

## 2013-12-15 NOTE — Progress Notes (Signed)
BP 110/62  Pulse 68  Temp(Src) 97.8 F (36.6 C) (Oral)  Wt 140 lb 8 oz (63.73 kg)   CC: L shoulder pain  Subjective:    Patient ID: [name], male    DOB: 23-Sep-1931, 80 y.o.   MRN: [id number]  HPI: [name] is a 80 y.o. male presenting on 12/15/2013 for Shoulder Pain   Right handed.  3 mo ago while sitting on bucket in yard fell off and hit his left shoulder. Since then, has left shoulder pain and trouble raising left arm past 90 degrees. Worse pain at night time. Takes tylenol for left shoulder which helps some. No neck pain  On coumadin  Recently treated with 10d course levaquin for lung infection. Has f/u planned with pulm at [facility] next week.  Relevant past medical, surgical, family and social history reviewed and updated as indicated.  Allergies and medications reviewed and updated. Current Outpatient Prescriptions on File Prior to Visit  Medication Sig  . ALPRAZolam (XANAX) 0.25 MG tablet Take 0.25 mg by mouth 3 (three) times daily as needed for anxiety.  . carvedilol (COREG) 6.25 MG tablet Take 1 tablet (6.25 mg total) by mouth 2 (two) times daily with a meal.  . enalapril (VASOTEC) 5 MG tablet Take 1 tablet (5 mg total) by mouth 2 (two) times daily.  . flunisolide (NASAREL) 29 MCG/ACT (0.025%) nasal spray 2 sprays by Nasal route daily. Dose is for each nostril.   . furosemide (LASIX) 20 MG tablet Take 0.5 tablets (10 mg total) by mouth daily.  . Loratadine 10 MG CAPS Take 1 capsule by mouth as needed.   . montelukast (SINGULAIR) 10 MG tablet Take 10 mg by mouth daily as needed.    . warfarin (COUMADIN) 5 MG tablet Take as directed by the anticoagulation clinic  . Fluticasone-Salmeterol (ADVAIR DISKUS) 100-50 MCG/DOSE AEPB Inhale 1 puff into the lungs as needed.   . sucralfate (CARAFATE) 1 G tablet TAKE 1 TABLET BY MOUTH AS NEEDED   No current facility-administered medications on file prior to visit.   Patient Active Problem List   Diagnosis  Date Noted  . Cough 11/02/2013  . Bilateral pneumonia 11/02/2013  . Constipation 09/09/2013  . Hyperglycemia 09/09/2013  . Protein calorie malnutrition 09/09/2013  . Encounter for therapeutic drug monitoring 05/06/2013  . Left shoulder pain 01/20/2013  . Cellulitis 10/08/2012  . Medicare annual wellness visit, initial 04/22/2012  . 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 Left sided sciatica 06/11/2011  . Thrombocytopenia 02/25/2011  . B12 deficiency 02/22/2011  . Sinus congestion 09/25/2010  . Long term current use of anticoagulant 06/27/2010  . ESOPHAGEAL STRICTURE 02/16/2010  . CHEST PAIN 01/03/2010  . Atrial flutter 12/20/2008  . ANXIETY STATE, UNSPECIFIED 12/08/2008  . Nonischemic cardiomyopathy 09/13/2008  . LBBB 09/13/2008  . CRT-ICD-Medtronic 09/13/2008  . PORPHYRIA 12/16/2006  . Chronic systolic heart failure 51/05/5850  . ALLERGIC RHINITIS 12/16/2006  . GERD 12/16/2006  . BENIGN PROSTATIC HYPERTROPHY 12/16/2006  . IBS 12/08/1997   Past Medical History  Diagnosis Date  . NICM (nonischemic cardiomyopathy)     Cardiac catheterization March 2006 without coronary disease; echocardiogram August 2008: EF 35%, mild AI, left atrial enlargement  . Atrial flutter     A.  Status post cardioversion; B.  Tikosyn therapy - failed, remains in aflutte  . COPD (chronic obstructive pulmonary disease)     spirometry 2015 - no obstruction, + mild restrictive lung disease  . LBBB (left bundle branch block)   .  GERD (gastroesophageal reflux disease) 2010    h/o esophageal stricture with dilation, LA grade C reflux esophagitis by EGD 2010  . Porphyria   . IBS (irritable bowel syndrome)   . BPH (benign prostatic hyperplasia)   . History of diverticulitis of colon   . Allergic rhinitis     [name]  . Chronic systolic congestive heart failure   . History of GI bleed     Secondary to hemorrhoids  . Extrinsic asthma     [name]  . 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  Lucky Cowboy)  . Multiple pulmonary nodules 06/2013    RUL (Dr. Adam Phenix at [facility]) - ?vasculitis as of last CT at [facility]   Past Surgical History  Procedure Laterality Date  . Penile prosthesis implant  2007    Otelin  . Cholecystectomy    . Goiter removal    . Nose surgery  1971  . Hernia repair    . Cardiac catheterization  06/22/04    Severe, nonischemic cardiomyopathy,EF 25-30%  . Biv-aicd implant      Medtronic  . Colonoscopy  10/99    Divertic, splenic, hepatic fleure only  . Esophagogastroduodenoscopy  01/1998    stricture, sliding HH, GERD  . Esophageal dilation  02/18/98  . Esophagogastroduodenoscopy  04/08/01    stricture, gastritis, HH, GERD-no dilation(Dr. Henrene Pastor)  . Hernia repair  01/30/02    Dr. Hassell Done  . Pulmonary eval  04/2002    ([facility]) Chronic congestive symptoms  . US echocardiography  07/13/04    EF 25-30%, Mod LVH; LA severe dilation; Mild AR; IRTR  . Esophagogastroduodenoscopy  12/22/04    stricture; gastritis; duodenitis, GERD  . US echocardiography  11/27/06    hypokinesis posterior wall, EF 35%; mild AR  . Esophagogastroduodenoscopy  10/21/08    Reflux esophagitis; Erythem. Duod. (Dr. Vira Agar)  . Colonoscopy  10/21/08    aborted-divertics, int hemms (Dr. Vira Agar)  . Cardioversion  02/22/09    AFlutter-([facility])  . Coronary angioplasty    . Back surgery  1997    bulging disks  . Esophagogastroduodenoscopy  08/2013    erythema gastric fundus - minimal gastritis, HH ([facility])  . Pft  10/2013    FVC 61%, FEV1 63%, ratio 0.76   History  Substance Use Topics  . Smoking status: Never Smoker   . Smokeless tobacco: Never Used  . Alcohol Use: No   Family History  Problem Relation Age of Onset  . Coronary artery disease Father   . Hypertension Father   . Dysphagia Sister   . Breast cancer    . Ovarian cancer    . Uterine cancer    . Heart failure Mother   . Other Sister     stomach problems  . Other Sister     stomach problems  . Other Sister     stomach  problems  . Alcohol abuse Maternal Uncle     Review of Systems Per HPI unless specifically indicated above    Objective:    BP 110/62  Pulse 68  Temp(Src) 97.8 F (36.6 C) (Oral)  Wt 140 lb 8 oz (63.73 kg)  Physical Exam  Nursing note and vitals reviewed. Constitutional: He appears well-developed and well-nourished. No distress.  thin  Musculoskeletal: He exhibits no edema.  L shoulder with some pain to full ROM in abduction and forward flexion. R Shoulder exam: No deformity of shoulders on inspection. Tender to palpation anterior shoulder  at [facility] joints and posterior shoulder at subdeltoid/acromial bursas. Limited active ROM past 45 degrees, + drop test. Full passive ROM  + pain/weakness with testing SITS in ext/int rotation. + impingement. + pain with rotation of humeral head in [facility] joint.   Skin: Skin is warm and dry. No rash noted.   Results for orders placed in visit on 12/09/13  POCT INR      Result Value Ref Range   INR 2.7        Assessment & Plan:   Problem List Items Addressed This Visit   Left shoulder pain - Primary     Persistent L shoulder pain/limitation 3 mo after injury, with evident loss of active but not passive ROM Anticipate partial or full thickness tear of one of RTC tendons, along with bursitis component. Not consistent with adhesive capsulitis Check Xray today to r/o bony injury after fall. Continue tylenol prn pain, avoid NSAIDs given anticoagulation, and refer to ortho for further evaluation. I would be hesitant for surgery given cardiac history but would get cards opinion re this if needed.    Relevant Orders      DG Shoulder Left      Ambulatory referral to Orthopedic Surgery       Follow up plan: Return if symptoms worsen or fail to improve.

## 2013-12-21 DIAGNOSIS — R918 Other nonspecific abnormal finding of lung field: Secondary | ICD-10-CM | POA: Diagnosis not present

## 2013-12-29 DIAGNOSIS — Z79899 Other long term (current) drug therapy: Secondary | ICD-10-CM | POA: Diagnosis not present

## 2013-12-29 DIAGNOSIS — R918 Other nonspecific abnormal finding of lung field: Secondary | ICD-10-CM | POA: Diagnosis not present

## 2013-12-29 DIAGNOSIS — Q321 Other congenital malformations of trachea: Secondary | ICD-10-CM | POA: Diagnosis not present

## 2013-12-29 DIAGNOSIS — J811 Chronic pulmonary edema: Secondary | ICD-10-CM | POA: Diagnosis not present

## 2013-12-29 DIAGNOSIS — K219 Gastro-esophageal reflux disease without esophagitis: Secondary | ICD-10-CM | POA: Diagnosis not present

## 2013-12-29 DIAGNOSIS — I509 Heart failure, unspecified: Secondary | ICD-10-CM | POA: Diagnosis not present

## 2013-12-29 DIAGNOSIS — J449 Chronic obstructive pulmonary disease, unspecified: Secondary | ICD-10-CM | POA: Diagnosis not present

## 2013-12-29 DIAGNOSIS — J9819 Other pulmonary collapse: Secondary | ICD-10-CM | POA: Diagnosis not present

## 2013-12-29 DIAGNOSIS — Q318 Other congenital malformations of larynx: Secondary | ICD-10-CM | POA: Diagnosis not present

## 2013-12-29 DIAGNOSIS — E785 Hyperlipidemia, unspecified: Secondary | ICD-10-CM | POA: Diagnosis not present

## 2013-12-29 DIAGNOSIS — Q324 Other congenital malformations of bronchus: Secondary | ICD-10-CM | POA: Diagnosis not present

## 2013-12-30 DIAGNOSIS — M67919 Unspecified disorder of synovium and tendon, unspecified shoulder: Secondary | ICD-10-CM | POA: Diagnosis not present

## 2013-12-30 DIAGNOSIS — M25519 Pain in unspecified shoulder: Secondary | ICD-10-CM | POA: Diagnosis not present

## 2013-12-30 DIAGNOSIS — M719 Bursopathy, unspecified: Secondary | ICD-10-CM | POA: Diagnosis not present

## 2014-01-01 DIAGNOSIS — I2789 Other specified pulmonary heart diseases: Secondary | ICD-10-CM | POA: Diagnosis not present

## 2014-01-01 DIAGNOSIS — I7090 Unspecified atherosclerosis: Secondary | ICD-10-CM | POA: Diagnosis not present

## 2014-01-01 DIAGNOSIS — R918 Other nonspecific abnormal finding of lung field: Secondary | ICD-10-CM | POA: Diagnosis not present

## 2014-01-01 DIAGNOSIS — I700 Atherosclerosis of aorta: Secondary | ICD-10-CM | POA: Diagnosis not present

## 2014-01-01 DIAGNOSIS — I079 Rheumatic tricuspid valve disease, unspecified: Secondary | ICD-10-CM | POA: Diagnosis not present

## 2014-01-01 DIAGNOSIS — R042 Hemoptysis: Secondary | ICD-10-CM | POA: Diagnosis not present

## 2014-01-01 DIAGNOSIS — R0902 Hypoxemia: Secondary | ICD-10-CM | POA: Diagnosis not present

## 2014-01-01 DIAGNOSIS — I517 Cardiomegaly: Secondary | ICD-10-CM | POA: Diagnosis not present

## 2014-01-01 DIAGNOSIS — I080 Rheumatic disorders of both mitral and aortic valves: Secondary | ICD-10-CM | POA: Diagnosis not present

## 2014-01-01 DIAGNOSIS — R0602 Shortness of breath: Secondary | ICD-10-CM | POA: Diagnosis not present

## 2014-01-01 DIAGNOSIS — I7789 Other specified disorders of arteries and arterioles: Secondary | ICD-10-CM | POA: Diagnosis not present

## 2014-01-05 DIAGNOSIS — H2510 Age-related nuclear cataract, unspecified eye: Secondary | ICD-10-CM | POA: Diagnosis not present

## 2014-01-06 ENCOUNTER — Ambulatory Visit (INDEPENDENT_AMBULATORY_CARE_PROVIDER_SITE_OTHER): Payer: Medicare Other

## 2014-01-06 DIAGNOSIS — Z7901 Long term (current) use of anticoagulants: Secondary | ICD-10-CM

## 2014-01-06 DIAGNOSIS — Z5181 Encounter for therapeutic drug level monitoring: Secondary | ICD-10-CM

## 2014-01-06 DIAGNOSIS — I4892 Unspecified atrial flutter: Secondary | ICD-10-CM | POA: Diagnosis not present

## 2014-01-06 LAB — POCT INR: INR: 1.8

## 2014-01-25 ENCOUNTER — Encounter: Payer: Self-pay | Admitting: Family Medicine

## 2014-01-25 ENCOUNTER — Ambulatory Visit (INDEPENDENT_AMBULATORY_CARE_PROVIDER_SITE_OTHER): Payer: Medicare Other | Admitting: Family Medicine

## 2014-01-25 VITALS — BP 110/60 | HR 70 | Temp 97.7°F | Wt 139.8 lb

## 2014-01-25 DIAGNOSIS — B9789 Other viral agents as the cause of diseases classified elsewhere: Secondary | ICD-10-CM

## 2014-01-25 DIAGNOSIS — J329 Chronic sinusitis, unspecified: Secondary | ICD-10-CM | POA: Diagnosis not present

## 2014-01-25 DIAGNOSIS — B349 Viral infection, unspecified: Secondary | ICD-10-CM

## 2014-01-25 MED ORDER — GUAIFENESIN-CODEINE 100-10 MG/5ML PO SYRP: 5.0000 mL | ORAL_SOLUTION | Freq: Two times a day (BID) | ORAL | Status: DC | PRN

## 2014-01-25 MED ORDER — AZITHROMYCIN 250 MG PO TABS: ORAL_TABLET | ORAL | Status: DC

## 2014-01-25 NOTE — Progress Notes (Signed)
BP 110/60  Pulse 70  Temp(Src) 97.7 F (36.5 C) (Oral)  Wt 139 lb 12 oz (63.39 kg)  SpO2 98%   CC: "stopped up"  Subjective:    Patient ID: [name], male    DOB: [birth year], 80 y.o.   MRN: [id number]  HPI: [name] is a 81 y.o. male presenting on 01/25/2014 for Sinusitis   Recent trip to [place] with sick contact. Cough productive of thick yellow mucous, head congestion, PNdrainage. Tmax 99.8. Minimal dyspnea.  No fevers/chills, ear or tooth pain, headaches, wheezing.   CT of lungs and bronchoscopy 12/2013 - told lungs were clean. Did have several blood vessels close to bronchial surface. No longer hemoptysis.  Last abx was levaquin 10d course 12/2013.  Relevant past medical, surgical, family and social history reviewed and updated as indicated.  Allergies and medications reviewed and updated. Current Outpatient Prescriptions on File Prior to Visit  Medication Sig  . ALPRAZolam (XANAX) 0.25 MG tablet Take 0.25 mg by mouth 3 (three) times daily as needed for anxiety.  . carvedilol (COREG) 6.25 MG tablet Take 1 tablet (6.25 mg total) by mouth 2 (two) times daily with a meal.  . docusate sodium (COLACE) 100 MG capsule Take 1 capsule (100 mg total) by mouth daily.  . enalapril (VASOTEC) 5 MG tablet Take 1 tablet (5 mg total) by mouth 2 (two) times daily.  . flunisolide (NASAREL) 29 MCG/ACT (0.025%) nasal spray 2 sprays by Nasal route daily. Dose is for each nostril.   . Fluticasone-Salmeterol (ADVAIR DISKUS) 100-50 MCG/DOSE AEPB Inhale 1 puff into the lungs as needed.   . furosemide (LASIX) 20 MG tablet Take 0.5 tablets (10 mg total) by mouth daily.  . Loratadine 10 MG CAPS Take 1 capsule by mouth as needed.   . montelukast (SINGULAIR) 10 MG tablet Take 10 mg by mouth daily as needed.    . sucralfate (CARAFATE) 1 G tablet TAKE 1 TABLET BY MOUTH AS NEEDED  . warfarin (COUMADIN) 5 MG tablet Take as directed by the anticoagulation clinic   No current  facility-administered medications on file prior to visit.    Review of Systems Per HPI unless specifically indicated above    Objective:    BP 110/60  Pulse 70  Temp(Src) 97.7 F (36.5 C) (Oral)  Wt 139 lb 12 oz (63.39 kg)  SpO2 98%  Physical Exam  Nursing note and vitals reviewed. Constitutional: He appears well-developed and well-nourished. No distress.  HENT:  Head: Normocephalic and atraumatic.  Right Ear: Hearing, tympanic membrane, external ear and ear canal normal.  Left Ear: Hearing, tympanic membrane, external ear and ear canal normal.  Nose: Mucosal edema (nasal pallor) present. No rhinorrhea. Right sinus exhibits no maxillary sinus tenderness and no frontal sinus tenderness. Left sinus exhibits no maxillary sinus tenderness and no frontal sinus tenderness.  Mouth/Throat: Uvula is midline, oropharynx is clear and moist and mucous membranes are normal. No oropharyngeal exudate, posterior oropharyngeal edema, posterior oropharyngeal erythema or tonsillar abscesses.  Eyes: Conjunctivae and EOM are normal. Pupils are equal, round, and reactive to light. No scleral icterus.  Neck: Normal range of motion. Neck supple.  Cardiovascular: Normal rate, regular rhythm, normal heart sounds and intact distal pulses.   No murmur heard. Pulmonary/Chest: Effort normal and breath sounds normal. No respiratory distress. He has no wheezes. He has no rales.  Minimal crackles bibasilarly  Lymphadenopathy:    He has no cervical adenopathy.  Skin: Skin is warm and dry. No rash  noted.   Results for orders placed in visit on 01/06/14  POCT INR      Result Value Ref Range   INR 1.8        Assessment & Plan:   Problem List Items Addressed This Visit   Viral sinusitis - Primary     Anticipate viral given short duration. Discussed this. Supportive care as per instructions, codeine cough syrup for night time. Given comorbidities, provided with WASP for zpack with instructions on when to  fill. Pt will contact coumadin clinic if he fills to titrate coumadin accordingly. rtc if not improving as expected or any acute worsening.    Relevant Medications      azithromycin (ZITHROMAX) tablet      cheratussin       Follow up plan: Return if symptoms worsen or fail to improve.

## 2014-01-25 NOTE — Progress Notes (Signed)
Pre visit review using our clinic review tool, if applicable. No additional management support is needed unless otherwise documented below in the visit note. 

## 2014-01-25 NOTE — Assessment & Plan Note (Signed)
Anticipate viral given short duration. Discussed this. Supportive care as per instructions, codeine cough syrup for night time. Given comorbidities, provided with WASP for zpack with instructions on when to fill. Pt will contact coumadin clinic if he fills to titrate coumadin accordingly. rtc if not improving as expected or any acute worsening.

## 2014-01-25 NOTE — Patient Instructions (Signed)
I think you have viral sinusitis - treat with plenty of fluids and rest. May use codeine cough syrup for night time. Fill zpack if not improving towards end of week, fill sooner if any worsening symptoms. Let us know how you're doing.

## 2014-01-27 ENCOUNTER — Encounter: Payer: Medicare Other | Admitting: Internal Medicine

## 2014-02-03 ENCOUNTER — Ambulatory Visit (INDEPENDENT_AMBULATORY_CARE_PROVIDER_SITE_OTHER): Payer: Medicare Other

## 2014-02-03 DIAGNOSIS — I4892 Unspecified atrial flutter: Secondary | ICD-10-CM | POA: Diagnosis not present

## 2014-02-03 DIAGNOSIS — Z5181 Encounter for therapeutic drug level monitoring: Secondary | ICD-10-CM | POA: Diagnosis not present

## 2014-02-03 DIAGNOSIS — Z7901 Long term (current) use of anticoagulants: Secondary | ICD-10-CM | POA: Diagnosis not present

## 2014-02-03 LAB — POCT INR: INR: 2.8

## 2014-03-02 ENCOUNTER — Ambulatory Visit (INDEPENDENT_AMBULATORY_CARE_PROVIDER_SITE_OTHER): Payer: Medicare Other | Admitting: Internal Medicine

## 2014-03-02 ENCOUNTER — Encounter: Payer: Self-pay | Admitting: Internal Medicine

## 2014-03-02 VITALS — BP 110/62 | HR 77 | Ht 66.0 in | Wt 143.8 lb

## 2014-03-02 DIAGNOSIS — I484 Atypical atrial flutter: Secondary | ICD-10-CM

## 2014-03-02 DIAGNOSIS — Z95810 Presence of automatic (implantable) cardiac defibrillator: Secondary | ICD-10-CM

## 2014-03-02 DIAGNOSIS — I5022 Chronic systolic (congestive) heart failure: Secondary | ICD-10-CM | POA: Diagnosis not present

## 2014-03-02 LAB — MDC_IDC_ENUM_SESS_TYPE_INCLINIC
Battery Voltage: 2.86 V
Brady Statistic AP VP Percent: 0.52 %
Brady Statistic AP VS Percent: 0 %
Brady Statistic AS VP Percent: 86.11 %
Brady Statistic AS VS Percent: 13.38 %
Brady Statistic RA Percent Paced: 0.52 %
Brady Statistic RV Percent Paced: 86.62 %
Date Time Interrogation Session: 20151124221818
HighPow Impedance: 209 Ohm
HighPow Impedance: 361 Ohm
HighPow Impedance: 38 Ohm
HighPow Impedance: 50 Ohm
Lead Channel Impedance Value: 200 Ohm
Lead Channel Impedance Value: 285 Ohm
Lead Channel Impedance Value: 361 Ohm
Lead Channel Impedance Value: 437 Ohm
Lead Channel Impedance Value: 475 Ohm
Lead Channel Pacing Threshold Amplitude: 0.5 V
Lead Channel Pacing Threshold Amplitude: 0.875 V
Lead Channel Pacing Threshold Amplitude: 1 V
Lead Channel Pacing Threshold Pulse Width: 0.4 ms
Lead Channel Pacing Threshold Pulse Width: 0.4 ms
Lead Channel Pacing Threshold Pulse Width: 0.4 ms
Lead Channel Sensing Intrinsic Amplitude: 12.625 mV
Lead Channel Sensing Intrinsic Amplitude: 12.875 mV
Lead Channel Sensing Intrinsic Amplitude: 5.375 mV
Lead Channel Sensing Intrinsic Amplitude: 5.5 mV
Lead Channel Setting Pacing Amplitude: 2 V
Lead Channel Setting Pacing Amplitude: 2.5 V
Lead Channel Setting Pacing Pulse Width: 0.4 ms
Lead Channel Setting Pacing Pulse Width: 0.4 ms
Lead Channel Setting Sensing Sensitivity: 0.45 mV
Zone Setting Detection Interval: 300 ms
Zone Setting Detection Interval: 340 ms
Zone Setting Detection Interval: 350 ms
Zone Setting Detection Interval: 450 ms

## 2014-03-02 NOTE — Assessment & Plan Note (Signed)
He has left atrial flutter which persists. His ventricular rate is well controlled. He will continue systemic anticoagulation for now.

## 2014-03-02 NOTE — Progress Notes (Signed)
HPI Mr. Bursch returns today for followup. He is a very pleasant 80 year old man with a nonischemic cardiomyopathy, complete heart block, chronic systolic heart failure, chronic atrial fibrillation, status post biventricular ICD implantation secondary to all the above in the setting of left bundle branch block. The patient denies chest pain, shortness of breath, or peripheral edema. In the interim, he has been hospitalized with GI illness that sounds like some type of intestinal volvulus or intussusception. I do not have records presently. He also has a history of pulmonary nodules and there has been a question of vasculitis. He was placed on steroid therapy. Overall he feels better. Allergies  Allergen Reactions  . Clarithromycin Nausea Only  . Famotidine Other (See Comments)    ABD. PAIN  . Omeprazole Diarrhea  . Oxytetracycline     REACTION: BUMPS  . Penicillins     REACTION: SWELLING Amoxicillin ok  . Proton Pump Inhibitors Other (See Comments)    GI upset, diarrhea, gas, bloating  . Ranitidine Diarrhea     Current Outpatient Prescriptions  Medication Sig Dispense Refill  . acetaminophen (TYLENOL) 500 MG tablet Take 250 mg by mouth daily as needed. Pain    . ALPRAZolam (XANAX) 0.25 MG tablet Take 0.25 mg by mouth 3 (three) times daily as needed for anxiety.    . carvedilol (COREG) 6.25 MG tablet Take 1 tablet (6.25 mg total) by mouth 2 (two) times daily with a meal. 180 tablet 4  . docusate sodium (COLACE) 100 MG capsule Take 1 capsule (100 mg total) by mouth daily. (Patient taking differently: Take 100 mg by mouth daily as needed for mild constipation. )    . enalapril (VASOTEC) 5 MG tablet Take 1 tablet (5 mg total) by mouth 2 (two) times daily. 180 tablet 4  . flunisolide (NASAREL) 29 MCG/ACT (0.025%) nasal spray 2 sprays by Nasal route daily. Dose is for each nostril.     . Fluticasone-Salmeterol (ADVAIR DISKUS) 100-50 MCG/DOSE AEPB Inhale 1 puff into the lungs daily as needed  (shortness of breath or wheezing).     . furosemide (LASIX) 20 MG tablet Take 0.5 tablets (10 mg total) by mouth daily. 45 tablet 4  . Loratadine 10 MG CAPS Take 1 capsule by mouth daily as needed (allergies).     . montelukast (SINGULAIR) 10 MG tablet Take 10 mg by mouth daily as needed.      . sucralfate (CARAFATE) 1 G tablet TAKE 1 TABLET BY MOUTH AS NEEDED for stomach issues    . warfarin (COUMADIN) 5 MG tablet Take as directed by the anticoagulation clinic 135 tablet 1   No current facility-administered medications for this visit.     Past Medical History  Diagnosis Date  . NICM (nonischemic cardiomyopathy)     Cardiac catheterization March 2006 without coronary disease; echocardiogram August 2008: EF 35%, mild AI, left atrial enlargement  . Atrial flutter     A.  Status post cardioversion; B.  Tikosyn therapy - failed, remains in aflutte  . COPD (chronic obstructive pulmonary disease)     spirometry 2015 - no obstruction, + mild restrictive lung disease  . LBBB (left bundle branch block)   . GERD (gastroesophageal reflux disease) 2010    h/o esophageal stricture with dilation, LA grade C reflux esophagitis by EGD 2010  . Porphyria   . IBS (irritable bowel syndrome)   . BPH (benign prostatic hyperplasia)   . History of diverticulitis of colon   . Allergic rhinitis  Donneta Romberg  . Chronic systolic congestive heart failure   . History of GI bleed     Secondary to hemorrhoids  . Extrinsic asthma     [name]  . 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 Lucky Cowboy)  . Multiple pulmonary nodules 06/2013    RUL (Dr. Adam Phenix at [facility]) - ?vasculitis as of last CT at [place]:   All systems reviewed and negative except as noted in the HPI.   Past Surgical History  Procedure Laterality Date  . Penile prosthesis implant  2007    Otelin  . Cholecystectomy    . Goiter removal    . Nose surgery  1971  . Hernia repair    . Cardiac catheterization   06/22/04    Severe, nonischemic cardiomyopathy,EF 25-30%  . Biv-aicd implant      Medtronic  . Colonoscopy  10/99    Divertic, splenic, hepatic fleure only  . Esophagogastroduodenoscopy  01/1998    stricture, sliding HH, GERD  . Esophageal dilation  02/18/98  . Esophagogastroduodenoscopy  04/08/01    stricture, gastritis, HH, GERD-no dilation(Dr. Henrene Pastor)  . Hernia repair  01/30/02    Dr. Hassell Done  . Pulmonary eval  04/2002    ([facility]) Chronic congestive symptoms  . US echocardiography  07/13/04    EF 25-30%, Mod LVH; LA severe dilation; Mild AR; IRTR  . Esophagogastroduodenoscopy  12/22/04    stricture; gastritis; duodenitis, GERD  . US echocardiography  11/27/06    hypokinesis posterior wall, EF 35%; mild AR  . Esophagogastroduodenoscopy  10/21/08    Reflux esophagitis; Erythem. Duod. (Dr. Vira Agar)  . Colonoscopy  10/21/08    aborted-divertics, int hemms (Dr. Vira Agar)  . Cardioversion  02/22/09    AFlutter-([facility])  . Coronary angioplasty    . Back surgery  1997    bulging disks  . Esophagogastroduodenoscopy  08/2013    erythema gastric fundus - minimal gastritis, HH ([facility])  . Pft  10/2013    FVC 61%, FEV1 63%, ratio 0.76     Family History  Problem Relation Age of Onset  . Coronary artery disease Father   . Hypertension Father   . Dysphagia Sister   . Breast cancer    . Ovarian cancer    . Uterine cancer    . Heart failure Mother   . Other Sister     stomach problems  . Other Sister     stomach problems  . Other Sister     stomach problems  . Alcohol abuse Maternal Uncle      History   Social History  . Marital Status: Married    Spouse Name: N/A    Number of Children: 3  . Years of Education: N/A   Occupational History  . retired   .     Social History Main Topics  . Smoking status: Never Smoker   . Smokeless tobacco: Never Used  . Alcohol Use: No  . Drug Use: No  . Sexual Activity: Not on file   Other Topics Concern  . Not on file   Social History  Narrative   Lives with wife   3 children-8 grandchildren   Still works on a farm.   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 Jefm Bryant   No regular exercise     BP 110/62 mmHg  Pulse 77  Ht 5\' 6"  (1.676 m)  Wt  143 lb 12.8 oz (65.227 kg)  BMI 23.22 kg/m2  Physical Exam:  Well appearing 80 year old man, NAD HEENT: Unremarkable Neck:  No JVD, no thyromegally Lungs:  Clear with no wheezes, rales, or rhonchi. Well-healed ICD incision. HEART:  Regular rate rhythm, no murmurs, no rubs, no clicks Abd:  soft, positive bowel sounds, no organomegally, no rebound, no guarding Ext:  2 plus pulses, no edema, no cyanosis, no clubbing Skin:  No rashes no nodules Neuro:  CN II through XII intact, motor grossly intact  DEVICE  Normal device function.  See PaceArt for details.   Assess/Plan:

## 2014-03-02 NOTE — Patient Instructions (Signed)
Your physician wants you to follow-up in: 12 months with Dr. Knox Saliva will receive a reminder letter in the mail two months in advance. If you don't receive a letter, please call our office to schedule the follow-up appointment.    Remote monitoring is used to monitor your Pacemaker or ICD from home. This monitoring reduces the number of office visits required to check your device to one time per year. It allows [place] to keep an eye on the functioning of your device to ensure it is working properly. You are scheduled for a device check from home on 06/01/14. You may send your transmission at any time that day. If you have a wireless device, the transmission will be sent automatically. After your physician reviews your transmission, you will receive a postcard with your next transmission date.

## 2014-03-02 NOTE — Assessment & Plan Note (Signed)
His symptoms remain class II. He will continue his current medical therapy. He has biventricular pacing approximately 86% of the time, secondary to PVCs.

## 2014-03-02 NOTE — Assessment & Plan Note (Signed)
His Medtronic biventricular ICD is working normally. We'll plan to recheck in several months. His fluid index is stable.

## 2014-03-09 ENCOUNTER — Encounter: Payer: Self-pay | Admitting: Internal Medicine

## 2014-03-10 ENCOUNTER — Ambulatory Visit (INDEPENDENT_AMBULATORY_CARE_PROVIDER_SITE_OTHER): Payer: Medicare Other

## 2014-03-10 DIAGNOSIS — Z5181 Encounter for therapeutic drug level monitoring: Secondary | ICD-10-CM | POA: Diagnosis not present

## 2014-03-10 DIAGNOSIS — I4892 Unspecified atrial flutter: Secondary | ICD-10-CM

## 2014-03-10 DIAGNOSIS — Z7901 Long term (current) use of anticoagulants: Secondary | ICD-10-CM | POA: Diagnosis not present

## 2014-03-10 LAB — POCT INR: INR: 1.6

## 2014-03-12 ENCOUNTER — Encounter: Payer: Medicare Other | Admitting: Family Medicine

## 2014-03-12 DIAGNOSIS — J320 Chronic maxillary sinusitis: Secondary | ICD-10-CM | POA: Diagnosis not present

## 2014-03-12 DIAGNOSIS — R042 Hemoptysis: Secondary | ICD-10-CM | POA: Diagnosis not present

## 2014-03-12 DIAGNOSIS — J4541 Moderate persistent asthma with (acute) exacerbation: Secondary | ICD-10-CM | POA: Diagnosis not present

## 2014-03-12 DIAGNOSIS — R938 Abnormal findings on diagnostic imaging of other specified body structures: Secondary | ICD-10-CM | POA: Diagnosis not present

## 2014-03-16 ENCOUNTER — Ambulatory Visit (INDEPENDENT_AMBULATORY_CARE_PROVIDER_SITE_OTHER): Payer: Medicare Other | Admitting: Family Medicine

## 2014-03-16 ENCOUNTER — Encounter: Payer: Self-pay | Admitting: Family Medicine

## 2014-03-16 VITALS — BP 116/60 | HR 72 | Temp 97.8°F | Ht 66.25 in | Wt 146.2 lb

## 2014-03-16 DIAGNOSIS — E538 Deficiency of other specified B group vitamins: Secondary | ICD-10-CM | POA: Diagnosis not present

## 2014-03-16 DIAGNOSIS — I5022 Chronic systolic (congestive) heart failure: Secondary | ICD-10-CM | POA: Diagnosis not present

## 2014-03-16 DIAGNOSIS — Z7189 Other specified counseling: Secondary | ICD-10-CM

## 2014-03-16 DIAGNOSIS — Z7901 Long term (current) use of anticoagulants: Secondary | ICD-10-CM

## 2014-03-16 DIAGNOSIS — Z0000 Encounter for general adult medical examination without abnormal findings: Secondary | ICD-10-CM

## 2014-03-16 LAB — RENAL FUNCTION PANEL
Albumin: 3.7 g/dL (ref 3.5–5.2)
BUN: 21 mg/dL (ref 6–23)
CO2: 25 mEq/L (ref 19–32)
Calcium: 8.8 mg/dL (ref 8.4–10.5)
Chloride: 104 mEq/L (ref 96–112)
Creatinine, Ser: 1.2 mg/dL (ref 0.4–1.5)
GFR: 64.95 mL/min (ref >60.00)
Glucose, Bld: 93 mg/dL (ref 70–99)
Phosphorus: 3.3 mg/dL (ref 2.3–4.6)
Potassium: 4.6 mEq/L (ref 3.5–5.1)
Sodium: 136 mEq/L (ref 135–145)

## 2014-03-16 LAB — VITAMIN B12: Vitamin B-12: 169 pg/mL — ABNORMAL LOW (ref 211–911)

## 2014-03-16 MED ORDER — CYANOCOBALAMIN 1000 MCG/ML IJ SOLN
1000.0000 ug | Freq: Once | INTRAMUSCULAR | Status: AC
  Administered 2014-03-16: 1000 ug via INTRAMUSCULAR

## 2014-03-16 NOTE — Assessment & Plan Note (Signed)

## 2014-03-16 NOTE — Assessment & Plan Note (Signed)
Check b12 level today, then give B12 IM 1033mcg today.

## 2014-03-16 NOTE — Patient Instructions (Addendum)
Bring me copy of living will/advanced directive. Packet provided today.  Blood work today. Good to see you today.

## 2014-03-16 NOTE — Progress Notes (Signed)
BP 116/60 mmHg  Pulse 72  Temp(Src) 97.8 F (36.6 C) (Oral)  Ht 5' 6.25" (1.683 m)  Wt 146 lb 4 oz (66.339 kg)  BMI 23.42 kg/m2   CC: medicare wellness  Subjective:    Patient ID: [name], male    DOB: 07-02-1931, 80 y.o.   MRN: [id number]  HPI: [name] is a 80 y.o. male presenting on 03/16/2014 for Annual Exam   Saw [facility] pulm last week, started on levaquin course   Some trouble at higher frequencies on hearing screen - declines audiology referral. Vision screen with eye doctor Denies falls in last year, denies depression/anhedonia.  Preventative: Colonoscopy - 2010 Tiffany Kocher) aborted early 2/2 significant diverticulosis. Flu shot - declines Pneumovax - declines today Td 2011 zostavax 2008 Advanced directive: Wouldn't want prolonged life support. Wife would be medical decision maker. Has defibrillator in place. Asked to bring me copy  Lives with wife 3 children-8 grandchildren Still works on a farm. Retired from [employer] Had [employer] friend with Dr. Jefm Bryant Activity: No regular exercise Diet: good water, fruits/vegetables daily  Relevant past medical, surgical, family and social history reviewed and updated as indicated. Interim medical history since our last visit reviewed. Allergies and medications reviewed and updated.  Current Outpatient Prescriptions on File Prior to Visit  Medication Sig  . acetaminophen (TYLENOL) 500 MG tablet Take 250 mg by mouth daily as needed. Pain  . ALPRAZolam (XANAX) 0.25 MG tablet Take 0.25 mg by mouth 3 (three) times daily as needed for anxiety.  . carvedilol (COREG) 6.25 MG tablet Take 1 tablet (6.25 mg total) by mouth 2 (two) times daily with a meal.  . docusate sodium (COLACE) 100 MG capsule Take 1 capsule (100 mg total) by mouth daily. (Patient taking differently: Take 100 mg by mouth daily as needed for mild constipation. )  . enalapril (VASOTEC) 5 MG tablet Take 1 tablet (5 mg total)  by mouth 2 (two) times daily.  . flunisolide (NASAREL) 29 MCG/ACT (0.025%) nasal spray 2 sprays by Nasal route daily. Dose is for each nostril.   . Fluticasone-Salmeterol (ADVAIR DISKUS) 100-50 MCG/DOSE AEPB Inhale 1 puff into the lungs daily as needed (shortness of breath or wheezing).   . furosemide (LASIX) 20 MG tablet Take 0.5 tablets (10 mg total) by mouth daily.  . Loratadine 10 MG CAPS Take 1 capsule by mouth daily as needed (allergies).   . montelukast (SINGULAIR) 10 MG tablet Take 10 mg by mouth daily as needed.    . sucralfate (CARAFATE) 1 G tablet TAKE 1 TABLET BY MOUTH AS NEEDED for stomach issues  . warfarin (COUMADIN) 5 MG tablet Take as directed by the anticoagulation clinic   No current facility-administered medications on file prior to visit.    Review of Systems Per HPI unless specifically indicated above     Objective:    BP 116/60 mmHg  Pulse 72  Temp(Src) 97.8 F (36.6 C) (Oral)  Ht 5' 6.25" (1.683 m)  Wt 146 lb 4 oz (66.339 kg)  BMI 23.42 kg/m2  Wt Readings from Last 3 Encounters:  03/16/14 146 lb 4 oz (66.339 kg)  03/02/14 143 lb 12.8 oz (65.227 kg)  01/25/14 139 lb 12 oz (63.39 kg)    Physical Exam  Constitutional: He is oriented to person, place, and time. He appears well-developed and well-nourished. No distress.  HENT:  Head: Normocephalic and atraumatic.  Right Ear: Hearing, tympanic membrane, external ear and ear canal normal.  Left Ear:  Hearing, tympanic membrane, external ear and ear canal normal.  Nose: Nose normal.  Mouth/Throat: Uvula is midline, oropharynx is clear and moist and mucous membranes are normal. No oropharyngeal exudate, posterior oropharyngeal edema or posterior oropharyngeal erythema.  Eyes: Conjunctivae and EOM are normal. Pupils are equal, round, and reactive to light. No scleral icterus.  Neck: Normal range of motion. Neck supple. Carotid bruit is not present. No thyromegaly present.  Cardiovascular: Normal rate, regular  rhythm and intact distal pulses.   Murmur (mild) heard. Pulses:      Radial pulses are 2+ on the right side, and 2+ on the left side.  Pulmonary/Chest: Effort normal and breath sounds normal. No respiratory distress. He has no wheezes. He has no rales.  Abdominal: Soft. Bowel sounds are normal. He exhibits no distension and no mass. There is no tenderness. There is no rebound and no guarding.  Musculoskeletal: Normal range of motion. He exhibits no edema.  Lymphadenopathy:    He has no cervical adenopathy.  Neurological: He is alert and oriented to person, place, and time.  CN grossly intact, station and gait intact Recall 2/3, 2/3 with cue Calculation 3/5 serial 3s, D-L-R  Skin: Skin is warm and dry. No rash noted.  Psychiatric: He has a normal mood and affect. His behavior is normal. Judgment and thought content normal.  Nursing note and vitals reviewed.  Results for orders placed or performed in visit on 03/10/14  POCT INR  Result Value Ref Range   INR 1.6       Assessment & Plan:   Problem List Items Addressed This Visit    Medicare annual wellness visit, subsequent - Primary    I have personally reviewed the Medicare Annual Wellness questionnaire and have noted 1. The patient's medical and social history 2. Their use of alcohol, tobacco or illicit drugs 3. Their current medications and supplements 4. The patient's functional ability including ADL's, fall risks, home safety risks and hearing or visual impairment. 5. Diet and physical activity 6. Evidence for depression or mood disorders The patients weight, height, BMI have been recorded in the chart.  Hearing and vision has been addressed. I have made referrals, counseling and provided education to the patient based review of the above and I have provided the pt with a written personalized care plan for preventive services. Provider list updated - see scanned questionairre.  Reviewed preventative protocols and updated  unless pt declined.    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 Continue f/u with cards.    Relevant Orders      Renal function panel   B12 deficiency    Check b12 level today, then give B12 IM 1034mcg today.    Relevant Orders      Vitamin B12   Advanced care planning/counseling discussion    Advanced directive: Wouldn't want prolonged life support. Wife would be medical decision maker. Has defibrillator in place. Asked to bring me copy.        Follow up plan: Return in about 1 year (around 03/17/2015), or as needed, for medicare wellness.

## 2014-03-16 NOTE — Assessment & Plan Note (Addendum)
Advanced directive: Wouldn't want prolonged life support. Wife would be medical decision maker. Has defibrillator in place. Asked to bring me copy.

## 2014-03-16 NOTE — Addendum Note (Signed)
Addended by: Marchia Bond on: 03/16/2014 01:36 PM   Modules accepted: Orders, SmartSet

## 2014-03-16 NOTE — Assessment & Plan Note (Signed)
Chronic, stable. Continue f/u with cards.

## 2014-03-16 NOTE — Addendum Note (Signed)
Addended by: Marchia Bond on: 03/16/2014 04:11 PM   Modules accepted: Miquel Dunn

## 2014-03-16 NOTE — Assessment & Plan Note (Signed)
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

## 2014-03-17 ENCOUNTER — Ambulatory Visit (INDEPENDENT_AMBULATORY_CARE_PROVIDER_SITE_OTHER): Payer: Medicare Other

## 2014-03-17 DIAGNOSIS — Z7901 Long term (current) use of anticoagulants: Secondary | ICD-10-CM | POA: Diagnosis not present

## 2014-03-17 DIAGNOSIS — I4892 Unspecified atrial flutter: Secondary | ICD-10-CM

## 2014-03-17 DIAGNOSIS — Z5181 Encounter for therapeutic drug level monitoring: Secondary | ICD-10-CM | POA: Diagnosis not present

## 2014-03-17 LAB — POCT INR: INR: 2.2

## 2014-03-17 NOTE — Addendum Note (Signed)
Addended by: Ria Bush on: 03/17/2014 02:20 PM   Modules accepted: Miquel Dunn

## 2014-03-18 ENCOUNTER — Encounter: Payer: Self-pay | Admitting: Family Medicine

## 2014-03-20 ENCOUNTER — Other Ambulatory Visit: Payer: Self-pay | Admitting: Family Medicine

## 2014-04-07 ENCOUNTER — Ambulatory Visit (INDEPENDENT_AMBULATORY_CARE_PROVIDER_SITE_OTHER): Payer: Medicare Other

## 2014-04-07 DIAGNOSIS — I4892 Unspecified atrial flutter: Secondary | ICD-10-CM

## 2014-04-07 DIAGNOSIS — Z5181 Encounter for therapeutic drug level monitoring: Secondary | ICD-10-CM

## 2014-04-07 DIAGNOSIS — Z7901 Long term (current) use of anticoagulants: Secondary | ICD-10-CM | POA: Diagnosis not present

## 2014-04-07 LAB — POCT INR: INR: 2.9

## 2014-04-21 ENCOUNTER — Ambulatory Visit (INDEPENDENT_AMBULATORY_CARE_PROVIDER_SITE_OTHER): Payer: Medicare Other | Admitting: *Deleted

## 2014-04-21 DIAGNOSIS — D519 Vitamin B12 deficiency anemia, unspecified: Secondary | ICD-10-CM

## 2014-04-21 MED ORDER — CYANOCOBALAMIN 1000 MCG/ML IJ SOLN
1000.0000 ug | Freq: Once | INTRAMUSCULAR | Status: AC
  Administered 2014-04-21: 1000 ug via INTRAMUSCULAR

## 2014-05-05 ENCOUNTER — Ambulatory Visit (INDEPENDENT_AMBULATORY_CARE_PROVIDER_SITE_OTHER): Payer: Medicare Other | Admitting: Pharmacist

## 2014-05-05 DIAGNOSIS — I4892 Unspecified atrial flutter: Secondary | ICD-10-CM

## 2014-05-05 DIAGNOSIS — Z5181 Encounter for therapeutic drug level monitoring: Secondary | ICD-10-CM | POA: Diagnosis not present

## 2014-05-05 DIAGNOSIS — Z7901 Long term (current) use of anticoagulants: Secondary | ICD-10-CM

## 2014-05-05 LAB — POCT INR: INR: 1.7

## 2014-05-07 ENCOUNTER — Ambulatory Visit (INDEPENDENT_AMBULATORY_CARE_PROVIDER_SITE_OTHER): Payer: Medicare Other | Admitting: Family Medicine

## 2014-05-07 ENCOUNTER — Encounter: Payer: Self-pay | Admitting: Family Medicine

## 2014-05-07 ENCOUNTER — Ambulatory Visit (INDEPENDENT_AMBULATORY_CARE_PROVIDER_SITE_OTHER)
Admission: RE | Admit: 2014-05-07 | Discharge: 2014-05-07 | Disposition: A | Discharge status: Home / Self Care | Payer: Medicare Other | Source: Ambulatory Visit | Attending: Family Medicine | Admitting: Family Medicine

## 2014-05-07 VITALS — BP 120/60 | HR 64 | Temp 97.5°F | Ht 66.25 in | Wt 143.8 lb

## 2014-05-07 DIAGNOSIS — M75122 Complete rotator cuff tear or rupture of left shoulder, not specified as traumatic: Secondary | ICD-10-CM | POA: Insufficient documentation

## 2014-05-07 DIAGNOSIS — S4992XA Unspecified injury of left shoulder and upper arm, initial encounter: Secondary | ICD-10-CM

## 2014-05-07 DIAGNOSIS — Z7901 Long term (current) use of anticoagulants: Secondary | ICD-10-CM | POA: Insufficient documentation

## 2014-05-07 DIAGNOSIS — M19012 Primary osteoarthritis, left shoulder: Secondary | ICD-10-CM | POA: Diagnosis not present

## 2014-05-07 MED ORDER — HYDROCODONE-ACETAMINOPHEN 5-325 MG PO TABS: 0.5000 | ORAL_TABLET | Freq: Four times a day (QID) | ORAL | Status: DC | PRN

## 2014-05-07 NOTE — Progress Notes (Signed)
BP 120/60 mmHg  Pulse 64  Temp(Src) 97.5 F (36.4 C) (Oral)  Ht 5' 6.25" (1.683 m)  Wt 143 lb 12 oz (65.205 kg)  BMI 23.02 kg/m2  SpO2 97%   CC: shoulder injury  Subjective:    Patient ID: [name], male    DOB: 1932-03-19, 81 y.o.   MRN: [id number]  HPI: [name] is a 81 y.o. male presenting on 05/07/2014 for Shoulder Pain   Today putting small pull trailer onto [place], trailer fell down, pt reached to grab trailer and felt sudden sharp pain at left lateral shoulder. Point tender at left lateral shoulder. May have heard a pop.  No falls, neck pain, numbness or weakness of arm other than due to pain.   Took tylenol and xanax for pain today. Using linament patches as well.  Multiple comorbidities. On chronic coumadin. Recent dental work 2 wks ago, pain med made him feel drunk. Prior history of short head of biceps tendon rupture as well as L RTC injury.  Relevant past medical, surgical, family and social history reviewed and updated as indicated. Interim medical history since our last visit reviewed. Allergies and medications reviewed and updated. Current Outpatient Prescriptions on File Prior to Visit  Medication Sig  . acetaminophen (TYLENOL) 500 MG tablet Take 250 mg by mouth daily as needed. Pain  . ALPRAZolam (XANAX) 0.25 MG tablet Take 0.25 mg by mouth 3 (three) times daily as needed for anxiety.  . carvedilol (COREG) 6.25 MG tablet Take 1 tablet (6.25 mg total) by mouth 2 (two) times daily with a meal.  . cyanocobalamin (,VITAMIN B-12,) 1000 MCG/ML injection Inject 1 mL (1,000 mcg total) into the muscle every 30 (thirty) days. Started 03/2014  . docusate sodium (COLACE) 100 MG capsule Take 1 capsule (100 mg total) by mouth daily. (Patient taking differently: Take 100 mg by mouth daily as needed for mild constipation. )  . enalapril (VASOTEC) 5 MG tablet Take 1 tablet (5 mg total) by mouth 2 (two) times daily.  . flunisolide (NASAREL) 29 MCG/ACT  (0.025%) nasal spray 2 sprays by Nasal route daily. Dose is for each nostril.   . furosemide (LASIX) 20 MG tablet Take 0.5 tablets (10 mg total) by mouth daily.  . Loratadine 10 MG CAPS Take 1 capsule by mouth daily as needed (allergies).   . montelukast (SINGULAIR) 10 MG tablet Take 10 mg by mouth daily as needed.    . warfarin (COUMADIN) 5 MG tablet Take as directed by the anticoagulation clinic  . Fluticasone-Salmeterol (ADVAIR DISKUS) 100-50 MCG/DOSE AEPB Inhale 1 puff into the lungs daily as needed (shortness of breath or wheezing).   . sucralfate (CARAFATE) 1 G tablet TAKE 1 TABLET BY MOUTH AS NEEDED for stomach issues   No current facility-administered medications on file prior to visit.   Past Medical History  Diagnosis Date  . NICM (nonischemic cardiomyopathy)     Cardiac catheterization March 2006 without coronary disease; echocardiogram August 2008: EF 35%, mild AI, left atrial enlargement  . Atrial flutter     A.  Status post cardioversion; B.  Tikosyn therapy - failed, remains in aflutte  . COPD (chronic obstructive pulmonary disease)     spirometry 2015 - no obstruction, + mild restrictive lung disease  . LBBB (left bundle branch block)   . GERD (gastroesophageal reflux disease) 2010    h/o esophageal stricture with dilation, LA grade C reflux esophagitis by EGD 2010  . Porphyria   . IBS (irritable  bowel syndrome)   . BPH (benign prostatic hyperplasia)   . History of diverticulitis of colon   . Allergic rhinitis     [name]  . Chronic systolic congestive heart failure   . History of GI bleed     Secondary to hemorrhoids  . Extrinsic asthma     [name]  . 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 Lucky Cowboy)  . Multiple pulmonary nodules 06/2013    RUL ([facility]. Adam Phenix at [facility]) - ?vasculitis as of last CT at [facility]  . Chronic sinusitis   . Goiter     Past Surgical History  Procedure Laterality Date  . Penile prosthesis implant  2007    Otelin    . Cholecystectomy    . Goiter removal    . Nose surgery  1971  . Hernia repair    . Cardiac catheterization  06/22/04    Severe, nonischemic cardiomyopathy,EF 25-30%  . Biv-aicd implant      [employer]  . Colonoscopy  10/99    Divertic, splenic, hepatic fleure only  . Esophagogastroduodenoscopy  01/1998    stricture, sliding HH, GERD  . Esophageal dilation  02/18/98  . Esophagogastroduodenoscopy  04/08/01    stricture, gastritis, HH, GERD-no dilation(Dr. Henrene Pastor)  . Hernia repair  01/30/02    Dr. Hassell Done  . Pulmonary eval  04/2002    ([facility]) Chronic congestive symptoms  . US echocardiography  07/13/04    EF 25-30%, Mod LVH; LA severe dilation; Mild AR; IRTR  . Esophagogastroduodenoscopy  12/22/04    stricture; gastritis; duodenitis, GERD  . US echocardiography  11/27/06    hypokinesis posterior wall, EF 35%; mild AR  . Esophagogastroduodenoscopy  10/21/08    Reflux esophagitis; Erythem. Duod. (Dr. Vira Agar)  . Colonoscopy  10/21/08    aborted-divertics, int hemms (Dr. Vira Agar)  . Cardioversion  02/22/09    AFlutter-([facility])  . Coronary angioplasty    . Back surgery  1997    bulging disks  . Esophagogastroduodenoscopy  08/2013    erythema gastric fundus - minimal gastritis, HH ([facility])  . Pft  10/2013    FVC 61%, FEV1 63%, ratio 0.76    Review of Systems Per HPI unless specifically indicated above     Objective:    BP 120/60 mmHg  Pulse 64  Temp(Src) 97.5 F (36.4 C) (Oral)  Ht 5' 6.25" (1.683 m)  Wt 143 lb 12 oz (65.205 kg)  BMI 23.02 kg/m2  SpO2 97%  Wt Readings from Last 3 Encounters:  05/07/14 143 lb 12 oz (65.205 kg)  03/16/14 146 lb 4 oz (66.339 kg)  03/02/14 143 lb 12.8 oz (65.227 kg)    Physical Exam  Constitutional: He appears well-developed and well-nourished. No distress.  Musculoskeletal: He exhibits no edema.  Limited exam 2/2 pain. No gross deformity appreciated Point tender to palpation along left biceps tendons in bicipital groove as well as lateral superior  humerus. Tender to palpation along entire biceps tendon. No pain at clavicle or AC joint. Pain with SITS testing.  Nursing note and vitals reviewed.  Results for orders placed or performed in visit on 05/05/14  POCT INR  Result Value Ref Range   INR 1.7       Assessment & Plan:   Problem List Items Addressed This Visit    Injury of left shoulder - Primary    I'm concerned that he's again injured L biceps tendon - ?long head rupture, less likely RTC  tear. Recommended conservative treatment with arm sling, tylenol, and hydrocodone for breakthrough pain - start at 1/2 tablet to hopefully avoid sedation. I will also refer to ortho for further evaluation and ?biceps tendon injection - pt agrees with plan. One view L shoulder xray today      Relevant Orders   DG Shoulder 1V Left   Ambulatory referral to Orthopedic Surgery       Follow up plan: Return if symptoms worsen or fail to improve.

## 2014-05-07 NOTE — Assessment & Plan Note (Addendum)
I'm concerned that he's again injured L biceps tendon - ?long head rupture, less likely RTC tear. Recommended conservative treatment with arm sling, tylenol, and hydrocodone for breakthrough pain - start at 1/2 tablet to hopefully avoid sedation. I will also refer to ortho for further evaluation and ?biceps tendon injection - pt agrees with plan. One view L shoulder xray today

## 2014-05-07 NOTE — Patient Instructions (Signed)
I am suspicious of biceps tendon injury - we will refer you to ortho on Monday. Use arm sling, rest arm (no exertion), and may take 1/2 hydrocodone for pain (printed today). We will call you with ortho appointment later today.

## 2014-05-07 NOTE — Progress Notes (Signed)
Pre-visit discussion using our clinic review tool. No additional management support is needed unless otherwise documented below in the visit note.  

## 2014-05-10 DIAGNOSIS — M25512 Pain in left shoulder: Secondary | ICD-10-CM | POA: Diagnosis not present

## 2014-05-10 DIAGNOSIS — M75122 Complete rotator cuff tear or rupture of left shoulder, not specified as traumatic: Secondary | ICD-10-CM | POA: Diagnosis not present

## 2014-05-12 ENCOUNTER — Ambulatory Visit (INDEPENDENT_AMBULATORY_CARE_PROVIDER_SITE_OTHER): Payer: Medicare Other | Admitting: Cardiology

## 2014-05-12 ENCOUNTER — Encounter: Payer: Self-pay | Admitting: Cardiology

## 2014-05-12 VITALS — BP 110/64 | HR 63 | Ht 66.0 in | Wt 142.3 lb

## 2014-05-12 DIAGNOSIS — I5022 Chronic systolic (congestive) heart failure: Secondary | ICD-10-CM

## 2014-05-12 NOTE — Progress Notes (Signed)
HPI [name] presents for follow up of his cardiomyopathy and atrial flutter. Since I last saw him he has [name] well.   The patient denies any new symptoms such as chest discomfort, neck or arm discomfort. There has been no new shortness of breath, PND or orthopnea. There have been no reported palpitations, presyncope or syncope.  He did hurt his shoulder.    Allergies  Allergen Reactions  . Clarithromycin Nausea Only  . Famotidine Other (See Comments)    ABD. PAIN  . Omeprazole Diarrhea  . Oxytetracycline     REACTION: BUMPS  . Penicillins     REACTION: SWELLING Amoxicillin ok  . Proton Pump Inhibitors Other (See Comments)    GI upset, diarrhea, gas, bloating  . Ranitidine Diarrhea    Current Outpatient Prescriptions  Medication Sig Dispense Refill  . acetaminophen (TYLENOL) 500 MG tablet Take 250 mg by mouth daily as needed. Pain    . ALPRAZolam (XANAX) 0.25 MG tablet Take 0.25 mg by mouth 3 (three) times daily as needed for anxiety.    . carvedilol (COREG) 6.25 MG tablet Take 1 tablet (6.25 mg total) by mouth 2 (two) times daily with a meal. 180 tablet 4  . cyanocobalamin (,VITAMIN B-12,) 1000 MCG/ML injection Inject 1 mL (1,000 mcg total) into the muscle every 30 (thirty) days. Started 03/2014    . docusate sodium (COLACE) 100 MG capsule Take 1 capsule (100 mg total) by mouth daily. (Patient taking differently: Take 100 mg by mouth daily as needed for mild constipation. )    . enalapril (VASOTEC) 5 MG tablet Take 1 tablet (5 mg total) by mouth 2 (two) times daily. 180 tablet 4  . flunisolide (NASAREL) 29 MCG/ACT (0.025%) nasal spray 2 sprays by Nasal route daily. Dose is for each nostril.     . Fluticasone-Salmeterol (ADVAIR DISKUS) 100-50 MCG/DOSE AEPB Inhale 1 puff into the lungs daily as needed (shortness of breath or wheezing).     . furosemide (LASIX) 20 MG tablet Take 0.5 tablets (10 mg total) by mouth daily. 45 tablet 4  . HYDROcodone-acetaminophen  (NORCO/VICODIN) 5-325 MG per tablet Take by mouth.    . Loratadine 10 MG CAPS Take 1 capsule by mouth daily as needed (allergies).     . montelukast (SINGULAIR) 10 MG tablet Take 10 mg by mouth daily as needed.      . sucralfate (CARAFATE) 1 G tablet TAKE 1 TABLET BY MOUTH AS NEEDED for stomach issues    . warfarin (COUMADIN) 5 MG tablet Take as directed by the anticoagulation clinic 135 tablet 1   No current facility-administered medications for this visit.    Past Medical History  Diagnosis Date  . NICM (nonischemic cardiomyopathy)     Cardiac catheterization March 2006 without coronary disease; echocardiogram August 2008: EF 35%, mild AI, left atrial enlargement  . Atrial flutter     A.  Status post cardioversion; B.  Tikosyn therapy - failed, remains in aflutte  . COPD (chronic obstructive pulmonary disease)     spirometry 2015 - no obstruction, + mild restrictive lung disease  . LBBB (left bundle branch block)   . GERD (gastroesophageal reflux disease) 2010    h/o esophageal stricture with dilation, LA grade C reflux esophagitis by EGD 2010  . Porphyria   . IBS (irritable bowel syndrome)   . BPH (benign prostatic hyperplasia)   . History of diverticulitis of colon   . Allergic rhinitis     [name]  .  Chronic systolic congestive heart failure   . History of GI bleed     Secondary to hemorrhoids  . Extrinsic asthma     [name]  . 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 Lucky [name])  . Multiple pulmonary nodules 06/2013    RUL ([name]) - ?vasculitis as of last CT at [name]  . Chronic sinusitis   . Goiter     Past Surgical History  Procedure Laterality Date  . Penile prosthesis implant  2007    Otelin  . Cholecystectomy    . Goiter removal    . Nose surgery  1971  . Hernia repair    . Cardiac catheterization  06/22/04    Severe, nonischemic cardiomyopathy,EF 25-30%  . Biv-aicd implant      [employer]  . Colonoscopy  10/99     Divertic, splenic, hepatic fleure only  . Esophagogastroduodenoscopy  01/1998    stricture, sliding HH, GERD  . Esophageal dilation  02/18/98  . Esophagogastroduodenoscopy  04/08/01    stricture, gastritis, HH, GERD-no dilation(Dr. Henrene [name])  . Hernia repair  01/30/02    Dr. Hassell [name]  . Pulmonary eval  04/2002    ([name]) Chronic congestive symptoms  . US echocardiography  07/13/04    EF 25-30%, Mod LVH; LA severe dilation; Mild AR; IRTR  . Esophagogastroduodenoscopy  12/22/04    stricture; gastritis; duodenitis, GERD  . US echocardiography  11/27/06    hypokinesis posterior wall, EF 35%; mild AR  . Esophagogastroduodenoscopy  10/21/08    Reflux esophagitis; Erythem. Duod. (Dr. Vira [name])  . Colonoscopy  10/21/08    aborted-divertics, int hemms (Dr. Vira [name])  . Cardioversion  02/22/09    AFlutter-([facility])  . Coronary angioplasty    . Back surgery  1997    bulging disks  . Esophagogastroduodenoscopy  08/2013    erythema gastric fundus - minimal gastritis, HH ([name])  . Pft  10/2013    FVC 61%, FEV1 63%, ratio 0.76    ROS:  Left shoulder pain.  Otherwise as stated in the HPI and negative for all other systems.  PHYSICAL EXAM BP 110/64 mmHg  Pulse 63  Ht 5\' 6"  (1.676 m)  Wt 142 lb 4.8 oz (64.547 kg)  BMI 22.98 kg/m2 GENERAL:  Well appearing NECK:  No jugular venous distention, waveform within normal limits, carotid upstroke brisk and symmetric, no bruits, no thyromegaly LUNGS:  Diffuse fine crackles CHEST:  Well healed ICD pocket HEART:  PMI not displaced or sustained,S1 and S2 within normal limits, no S3, no clicks, no rubs, no murmurs ABD:  Flat, positive bowel sounds normal in frequency in pitch, no bruits, no rebound, no guarding, no midline pulsatile mass, no hepatomegaly, no splenomegaly EXT:  2 plus pulses throughout, no edema, no cyanosis no clubbing  EKG:  Atrial flutter, demand ventricular pacemaker rate 63.  No change from previous  05/12/2014  ASSESSMENT AND PLAN  Nonischemic  cardiomyopathy -  He seems to be euvolemic. No change in therapy is indicated.  I will repeat an echo this spring to follow up his reduced EF.    Atrial flutter -  The patient tolerates this rhythm and rate control and anticoagulation. We will continue with the meds as listed. He does not want to switch from warfarin.

## 2014-05-12 NOTE — Patient Instructions (Signed)
Your physician recommends that you schedule a follow-up appointment in: 6 months with Dr. [name]  We are scheduling an echo in June to be done at the hospital in [facility]

## 2014-05-25 DIAGNOSIS — M75122 Complete rotator cuff tear or rupture of left shoulder, not specified as traumatic: Secondary | ICD-10-CM | POA: Diagnosis not present

## 2014-05-26 ENCOUNTER — Ambulatory Visit (INDEPENDENT_AMBULATORY_CARE_PROVIDER_SITE_OTHER): Payer: Medicare Other

## 2014-05-26 DIAGNOSIS — Z7901 Long term (current) use of anticoagulants: Secondary | ICD-10-CM | POA: Diagnosis not present

## 2014-05-26 DIAGNOSIS — I4892 Unspecified atrial flutter: Secondary | ICD-10-CM | POA: Diagnosis not present

## 2014-05-26 LAB — POCT INR: INR: 2.6

## 2014-06-01 ENCOUNTER — Telehealth: Payer: Self-pay | Admitting: Cardiology

## 2014-06-01 ENCOUNTER — Ambulatory Visit (INDEPENDENT_AMBULATORY_CARE_PROVIDER_SITE_OTHER): Payer: Medicare Other | Admitting: *Deleted

## 2014-06-01 DIAGNOSIS — Z95810 Presence of automatic (implantable) cardiac defibrillator: Secondary | ICD-10-CM

## 2014-06-01 DIAGNOSIS — I5022 Chronic systolic (congestive) heart failure: Secondary | ICD-10-CM | POA: Diagnosis not present

## 2014-06-01 NOTE — Telephone Encounter (Signed)
LMOVM reminding pt to send remote transmission.   

## 2014-06-02 NOTE — Progress Notes (Signed)
Remote ICD transmission.   

## 2014-06-04 LAB — MDC_IDC_ENUM_SESS_TYPE_REMOTE
Battery Voltage: 2.78 V
Brady Statistic AP VP Percent: 0 %
Brady Statistic AP VS Percent: 0 %
Brady Statistic AS VP Percent: 91.21 %
Brady Statistic AS VS Percent: 8.79 %
Brady Statistic RA Percent Paced: 0 %
Brady Statistic RV Percent Paced: 91.21 %
Date Time Interrogation Session: 20160223230900
HighPow Impedance: 342 Ohm
HighPow Impedance: 39 Ohm
HighPow Impedance: 50 Ohm
Lead Channel Impedance Value: 209 Ohm
Lead Channel Impedance Value: 285 Ohm
Lead Channel Impedance Value: 361 Ohm
Lead Channel Impedance Value: 437 Ohm
Lead Channel Impedance Value: 475 Ohm
Lead Channel Pacing Threshold Amplitude: 0.5 V
Lead Channel Pacing Threshold Amplitude: 1 V
Lead Channel Pacing Threshold Amplitude: 1.125 V
Lead Channel Pacing Threshold Pulse Width: 0.4 ms
Lead Channel Pacing Threshold Pulse Width: 0.4 ms
Lead Channel Pacing Threshold Pulse Width: 0.4 ms
Lead Channel Sensing Intrinsic Amplitude: 13 mV
Lead Channel Sensing Intrinsic Amplitude: 13 mV
Lead Channel Sensing Intrinsic Amplitude: 4.875 mV
Lead Channel Sensing Intrinsic Amplitude: 4.875 mV
Lead Channel Setting Pacing Amplitude: 2.25 V
Lead Channel Setting Pacing Amplitude: 2.5 V
Lead Channel Setting Pacing Pulse Width: 0.4 ms
Lead Channel Setting Pacing Pulse Width: 0.4 ms
Lead Channel Setting Sensing Sensitivity: 0.45 mV
Zone Setting Detection Interval: 300 ms
Zone Setting Detection Interval: 340 ms
Zone Setting Detection Interval: 350 ms
Zone Setting Detection Interval: 450 ms

## 2014-06-07 ENCOUNTER — Other Ambulatory Visit: Payer: Self-pay | Admitting: *Deleted

## 2014-06-07 ENCOUNTER — Other Ambulatory Visit: Payer: Self-pay | Admitting: Cardiology

## 2014-06-07 MED ORDER — ENALAPRIL MALEATE 5 MG PO TABS: 5.0000 mg | ORAL_TABLET | Freq: Two times a day (BID) | ORAL | Status: DC

## 2014-06-11 ENCOUNTER — Encounter: Payer: Self-pay | Admitting: Cardiology

## 2014-06-14 ENCOUNTER — Telehealth: Payer: Self-pay | Admitting: Cardiology

## 2014-06-14 NOTE — Telephone Encounter (Signed)
°  1. Which medications need to be refilled? Enalapril-enough until his mail order comes in  2. Which pharmacy is medication to be sent to?[phone] 3. Do they need a 30 day or 90 day supply? 10 until order comes  4. Would they like a call back once the medication has been sent to the pharmacy? yes

## 2014-06-15 ENCOUNTER — Other Ambulatory Visit: Payer: Self-pay

## 2014-06-15 MED ORDER — ENALAPRIL MALEATE 5 MG PO TABS: 5.0000 mg | ORAL_TABLET | Freq: Two times a day (BID) | ORAL | Status: DC

## 2014-06-16 ENCOUNTER — Ambulatory Visit (INDEPENDENT_AMBULATORY_CARE_PROVIDER_SITE_OTHER): Payer: Medicare Other

## 2014-06-16 ENCOUNTER — Encounter: Payer: Self-pay | Admitting: Internal Medicine

## 2014-06-16 DIAGNOSIS — I4892 Unspecified atrial flutter: Secondary | ICD-10-CM | POA: Diagnosis not present

## 2014-06-16 DIAGNOSIS — Z7901 Long term (current) use of anticoagulants: Secondary | ICD-10-CM | POA: Diagnosis not present

## 2014-06-16 LAB — POCT INR: INR: 2

## 2014-06-16 MED ORDER — WARFARIN SODIUM 5 MG PO TABS: ORAL_TABLET | ORAL | Status: DC

## 2014-06-17 NOTE — Telephone Encounter (Signed)
Checking to see this encounter could be closed.Hit documentation by mistake.

## 2014-06-30 ENCOUNTER — Other Ambulatory Visit: Payer: Self-pay | Admitting: Cardiology

## 2014-07-01 ENCOUNTER — Other Ambulatory Visit: Payer: Self-pay

## 2014-07-01 DIAGNOSIS — I5022 Chronic systolic (congestive) heart failure: Secondary | ICD-10-CM

## 2014-07-01 MED ORDER — CARVEDILOL 6.25 MG PO TABS: 6.2500 mg | ORAL_TABLET | Freq: Two times a day (BID) | ORAL | Status: DC

## 2014-07-12 DIAGNOSIS — J300 Vasomotor rhinitis: Secondary | ICD-10-CM | POA: Diagnosis not present

## 2014-07-12 DIAGNOSIS — K219 Gastro-esophageal reflux disease without esophagitis: Secondary | ICD-10-CM | POA: Diagnosis not present

## 2014-07-12 DIAGNOSIS — H1045 Other chronic allergic conjunctivitis: Secondary | ICD-10-CM | POA: Diagnosis not present

## 2014-07-12 DIAGNOSIS — J4530 Mild persistent asthma, uncomplicated: Secondary | ICD-10-CM | POA: Diagnosis not present

## 2014-07-12 DIAGNOSIS — J019 Acute sinusitis, unspecified: Secondary | ICD-10-CM | POA: Diagnosis not present

## 2014-07-13 ENCOUNTER — Ambulatory Visit (INDEPENDENT_AMBULATORY_CARE_PROVIDER_SITE_OTHER): Payer: Medicare Other | Admitting: *Deleted

## 2014-07-13 ENCOUNTER — Other Ambulatory Visit: Payer: Self-pay | Admitting: *Deleted

## 2014-07-13 DIAGNOSIS — D519 Vitamin B12 deficiency anemia, unspecified: Secondary | ICD-10-CM | POA: Diagnosis not present

## 2014-07-13 MED ORDER — FUROSEMIDE 20 MG PO TABS: 10.0000 mg | ORAL_TABLET | Freq: Every day | ORAL | Status: DC

## 2014-07-13 MED ORDER — CYANOCOBALAMIN 1000 MCG/ML IJ SOLN
1000.0000 ug | Freq: Once | INTRAMUSCULAR | Status: AC
  Administered 2014-07-13: 1000 ug via INTRAMUSCULAR

## 2014-07-14 ENCOUNTER — Ambulatory Visit (INDEPENDENT_AMBULATORY_CARE_PROVIDER_SITE_OTHER): Payer: Medicare Other

## 2014-07-14 DIAGNOSIS — I4892 Unspecified atrial flutter: Secondary | ICD-10-CM | POA: Diagnosis not present

## 2014-07-14 DIAGNOSIS — Z7901 Long term (current) use of anticoagulants: Secondary | ICD-10-CM | POA: Diagnosis not present

## 2014-07-14 LAB — POCT INR: INR: 2.5

## 2014-07-28 ENCOUNTER — Ambulatory Visit (INDEPENDENT_AMBULATORY_CARE_PROVIDER_SITE_OTHER): Payer: Medicare Other

## 2014-07-28 DIAGNOSIS — I4892 Unspecified atrial flutter: Secondary | ICD-10-CM | POA: Diagnosis not present

## 2014-07-28 DIAGNOSIS — Z7901 Long term (current) use of anticoagulants: Secondary | ICD-10-CM | POA: Diagnosis not present

## 2014-07-28 LAB — POCT INR: INR: 4.1

## 2014-07-28 IMAGING — CT CT HEAD WITHOUT CONTRAST
1 series · 16 of 30 positions shown, 20 images · non-contrast
Comparison: Report of prior brain MRI March 29, 2003 available ;
images from that study not available.

CLINICAL DATA: Altered mental status and persistent nausea

EXAM:
CT HEAD WITHOUT CONTRAST
TECHNIQUE: Contiguous axial images were obtained from the base of the skull
through the vertex without intravenous contrast.

[Series 2: head wo · axial · 0.39mm/px · z∈[-47,+79]mm · 16 of 32 slices shown, 20 images]
[im 2/32  brain]
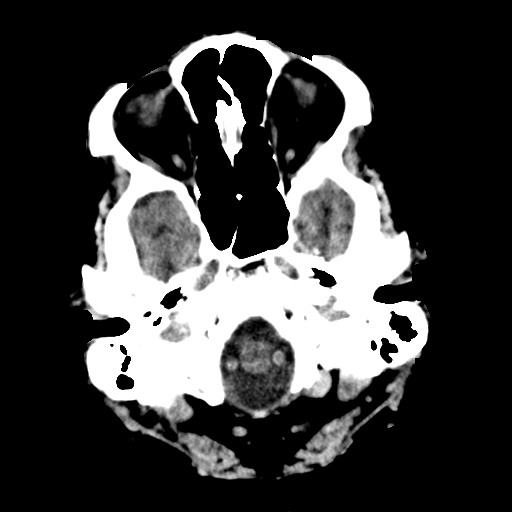
[im 2/32  bone]
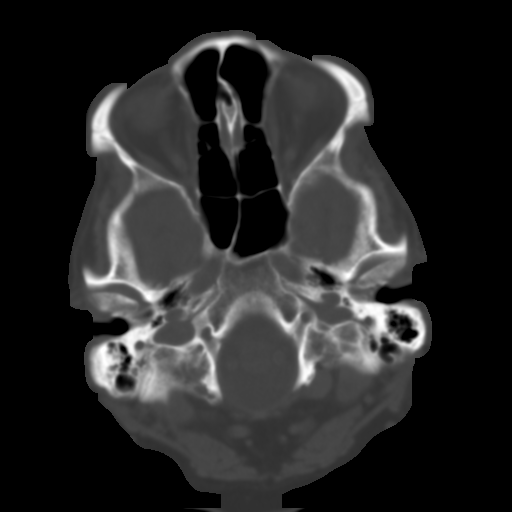
[im 4/32  brain]
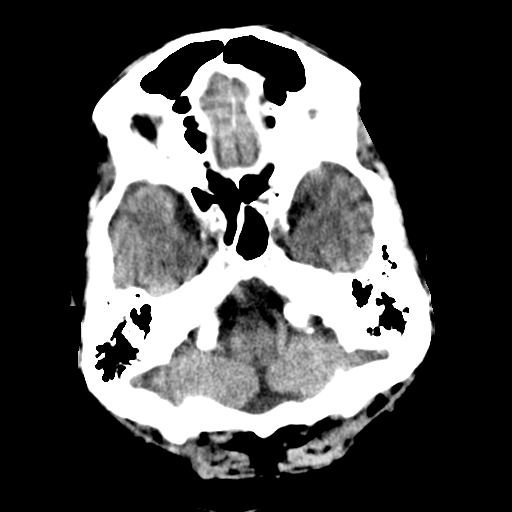
[im 6/32  brain]
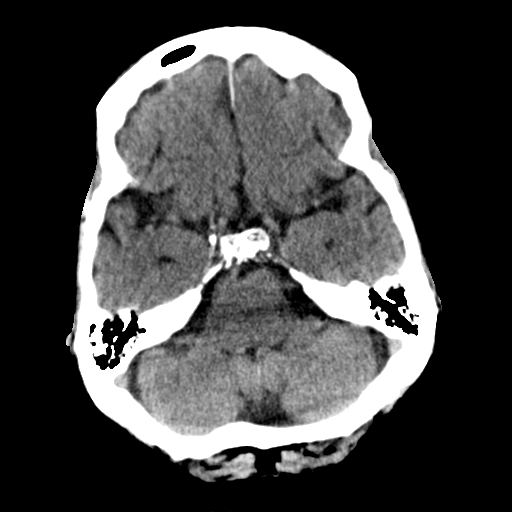
[im 8/32  brain]
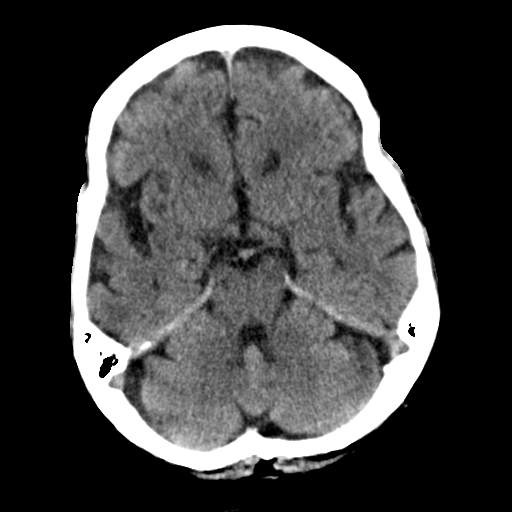
[im 9/32  brain]
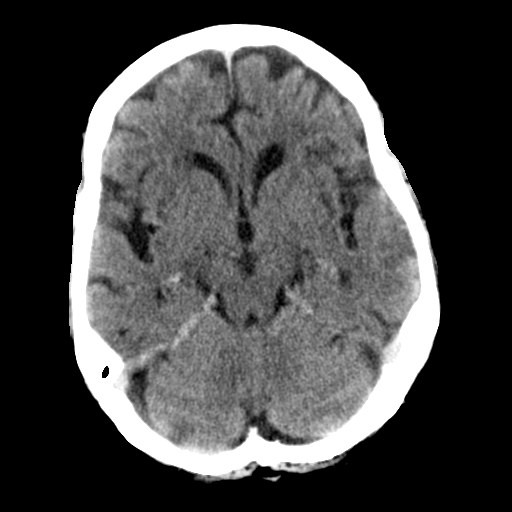
[im 9/32  bone]
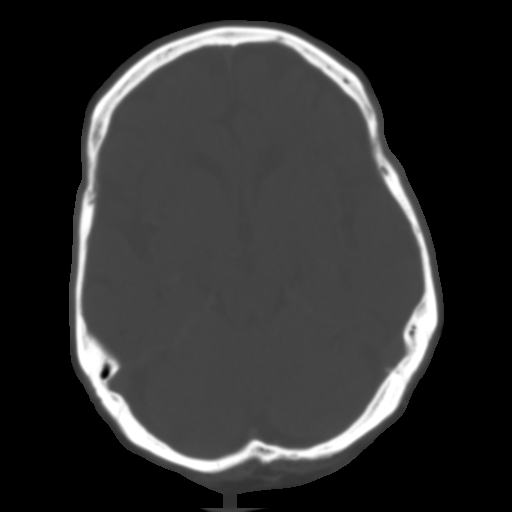
[im 11/32  brain]
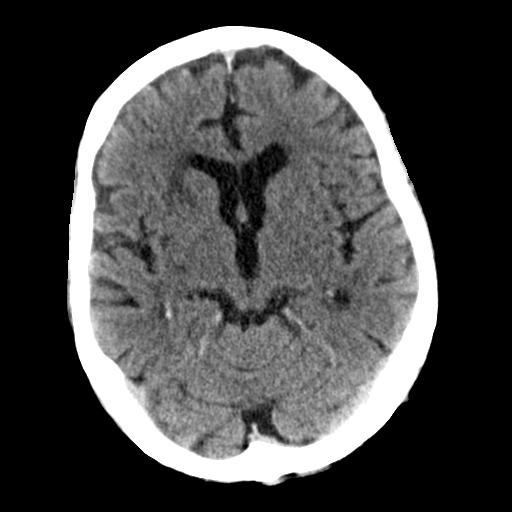
[im 13/32  brain]
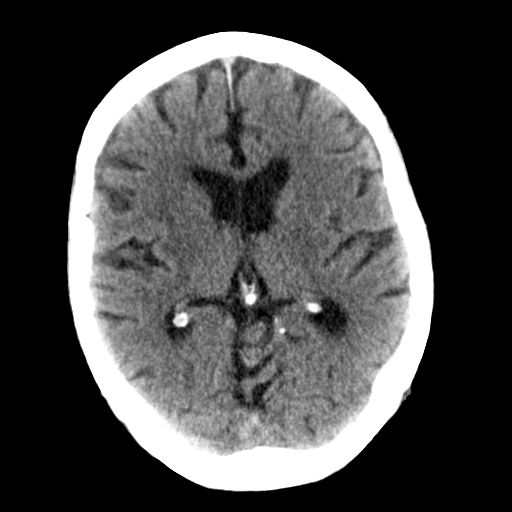
[im 15/32  brain]
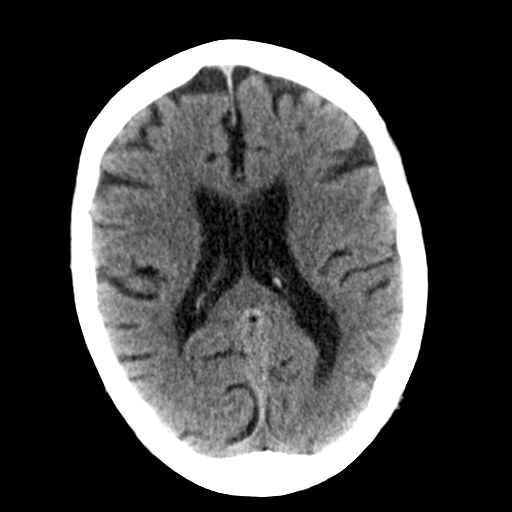
[im 17/32  brain]
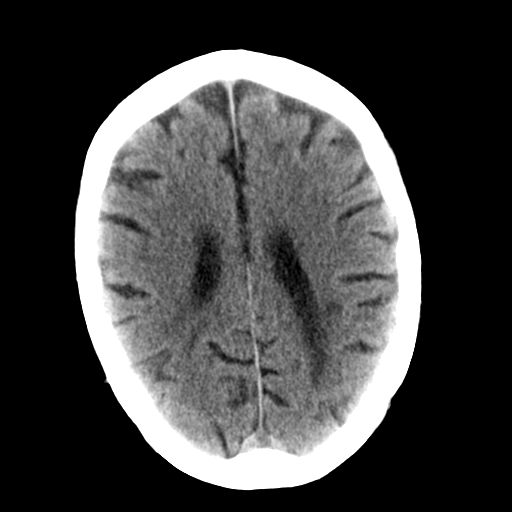
[im 17/32  bone]
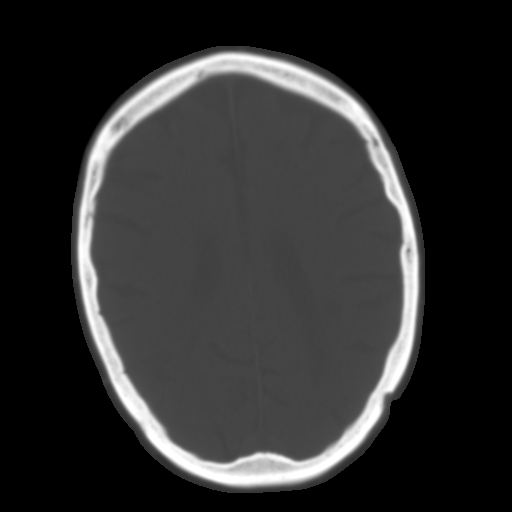
[im 19/32  brain]
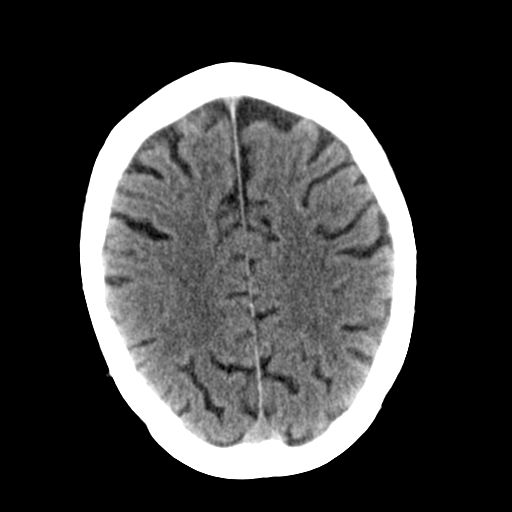
[im 21/32  brain]
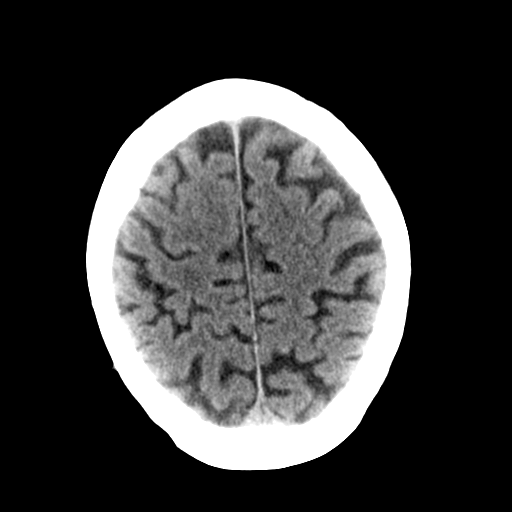
[im 23/32  brain]
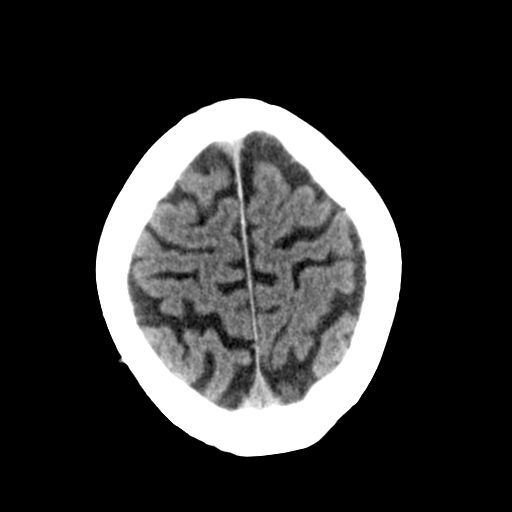
[im 24/32  brain]
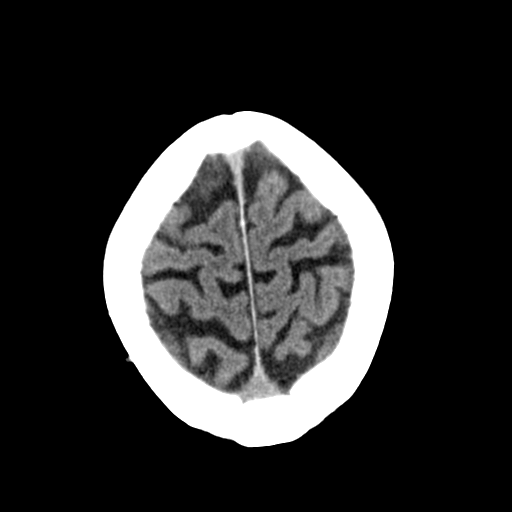
[im 24/32  bone]
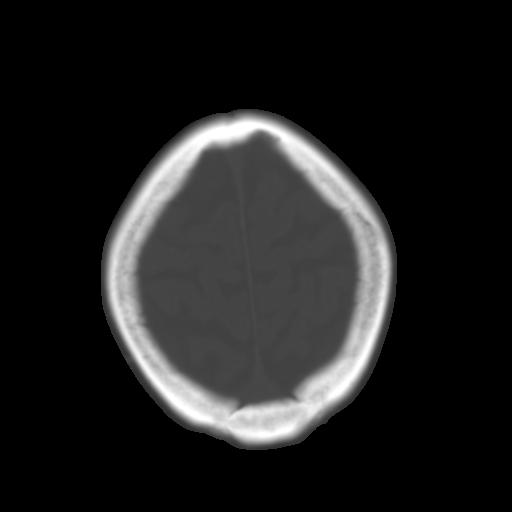
[im 26/32  brain]
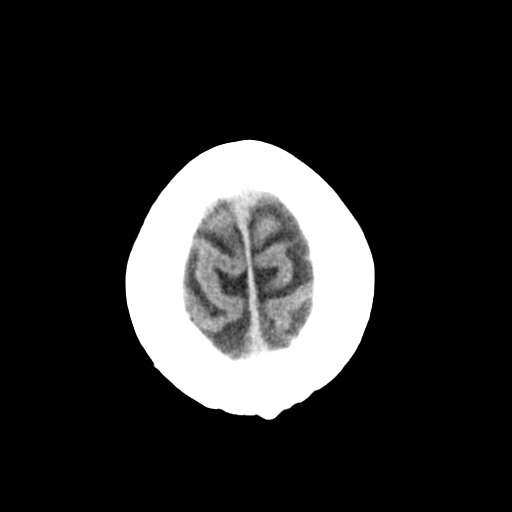
[im 28/32  brain]
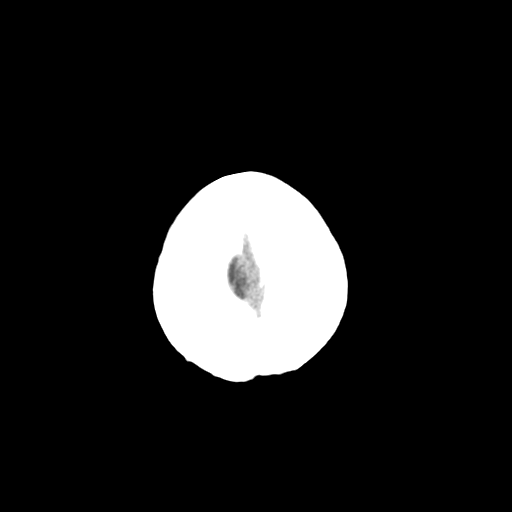
[im 30/32  brain]
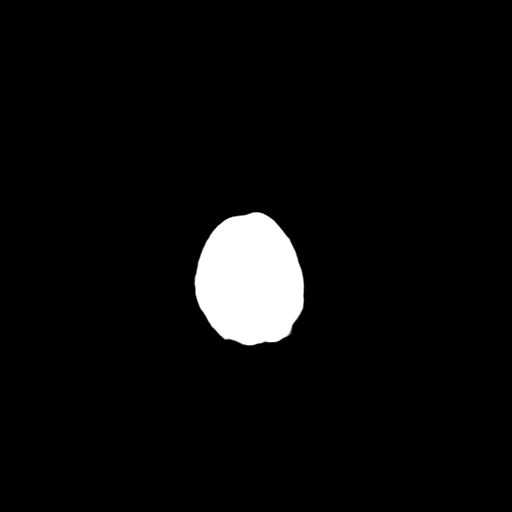

[16 of 30 positions shown; findings below may reference images not displayed]

FINDINGS: There is moderate diffuse atrophy. There is no mass, hemorrhage,
extra-axial fluid collection, or midline shift. There is focal
decreased attenuation in the right anterior external capsule and
claustrum as well as in the anterior limb of the right internal
capsule which may represent a recent infarct. There is patchy small
vessel disease in the centra semiovale bilaterally. Elsewhere,
gray-white compartments appear normal.

Bony calvarium appears intact.  The mastoid air cells are clear.
IMPRESSION: Question recent infarct in the right anterior basal ganglia region
as described. There is atrophy with patchy periventricular small
vessel disease. No hemorrhage or mass effect.

## 2014-07-30 ENCOUNTER — Telehealth: Payer: Self-pay | Admitting: Cardiology

## 2014-07-30 DIAGNOSIS — J329 Chronic sinusitis, unspecified: Secondary | ICD-10-CM | POA: Diagnosis not present

## 2014-07-30 DIAGNOSIS — J0100 Acute maxillary sinusitis, unspecified: Secondary | ICD-10-CM | POA: Diagnosis not present

## 2014-07-30 DIAGNOSIS — J301 Allergic rhinitis due to pollen: Secondary | ICD-10-CM | POA: Diagnosis not present

## 2014-07-30 IMAGING — CT CT ABD-PELV W/ CM
2 of 5 series · 15 of 46 positions shown, 17 images · IV contrast (agent unspecified)
Comparison: DG ACUTE ABDOMEN SERIES dated 08/20/2013

CLINICAL DATA: Nausea, vomiting for 6 days, dehydration.

EXAM:
CT ABDOMEN AND PELVIS WITH CONTRAST
TECHNIQUE: Multidetector CT imaging of the abdomen and pelvis was performed
using the standard protocol following bolus administration of
intravenous contrast.
CONTRAST:  80 cc Usovue-6PP

[Series 2: routine abd pel with · axial · 0.69mm/px · z∈[-972,-592]mm · 12 of 86 slices shown, 14 images]
[im 5/86  soft-tissue]
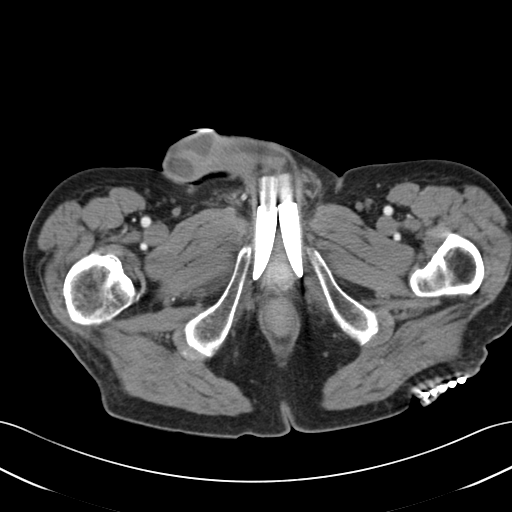
[im 5/86  bone]
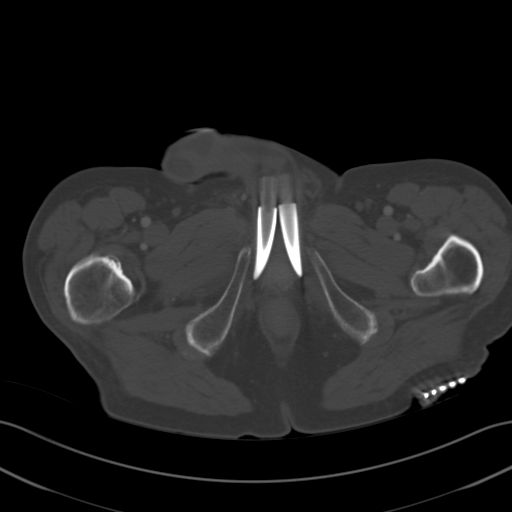
[im 14/86  soft-tissue]
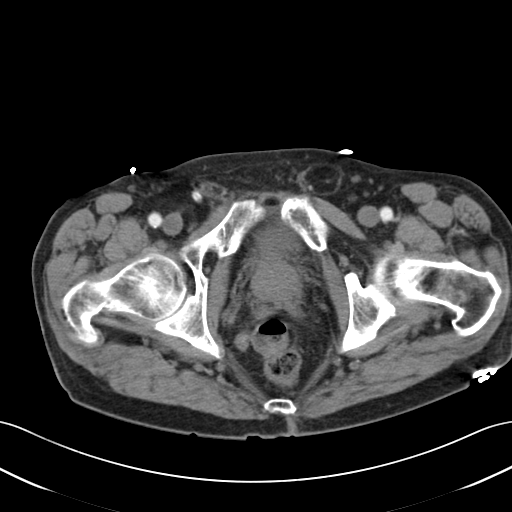
[im 18/86  soft-tissue]
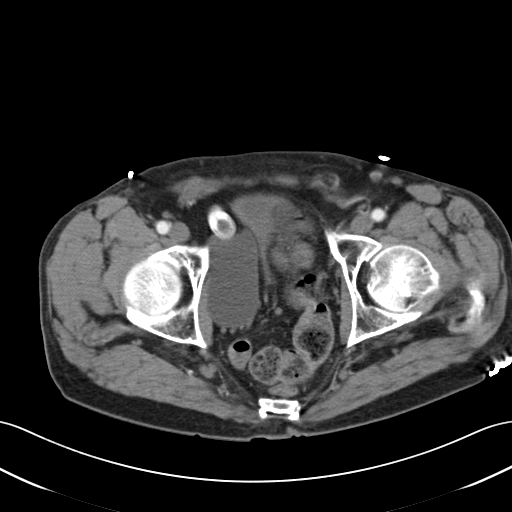
[im 27/86  soft-tissue]
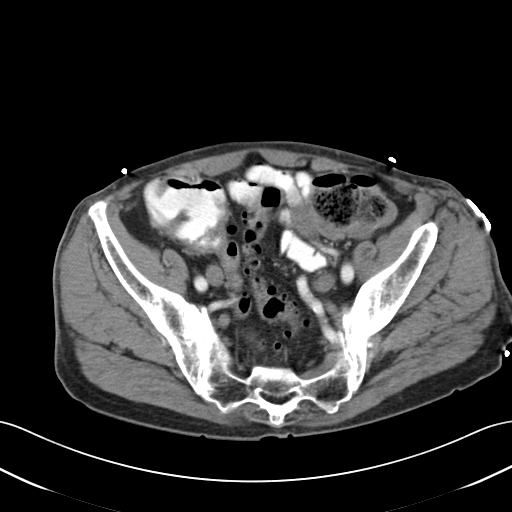
[im 32/86  soft-tissue]
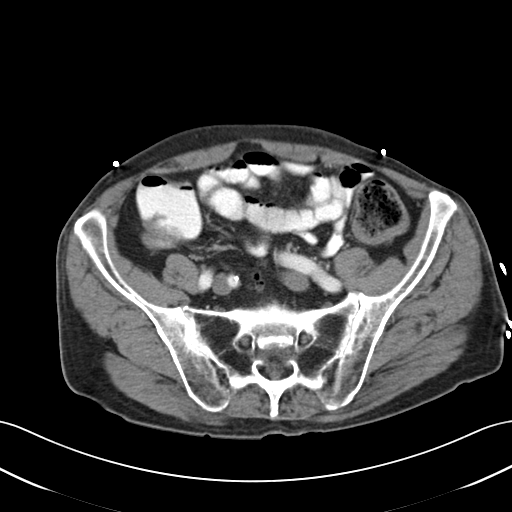
[im 41/86  soft-tissue]
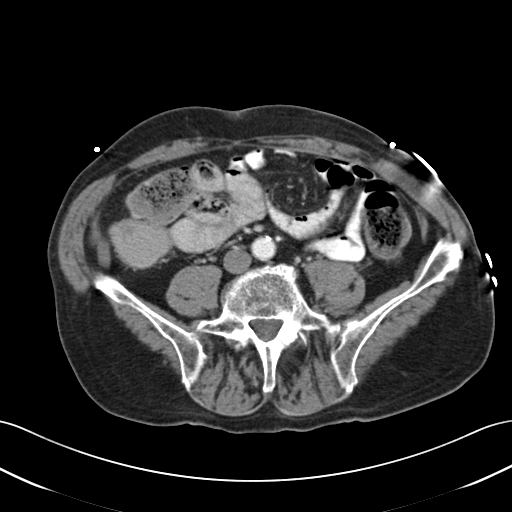
[im 45/86  soft-tissue]
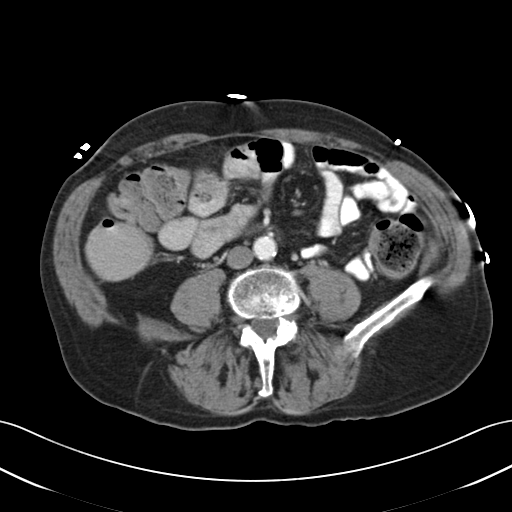
[im 54/86  soft-tissue]
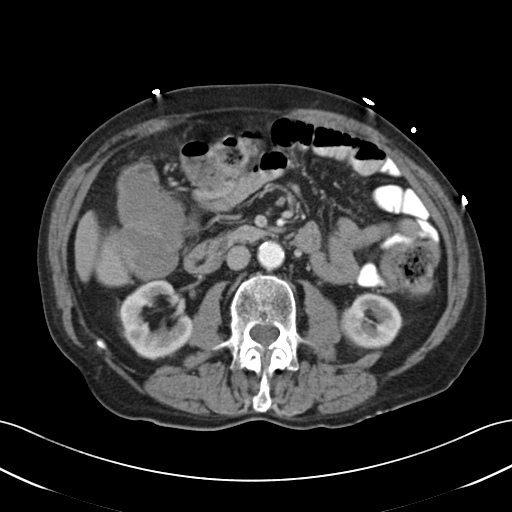
[im 59/86  soft-tissue]
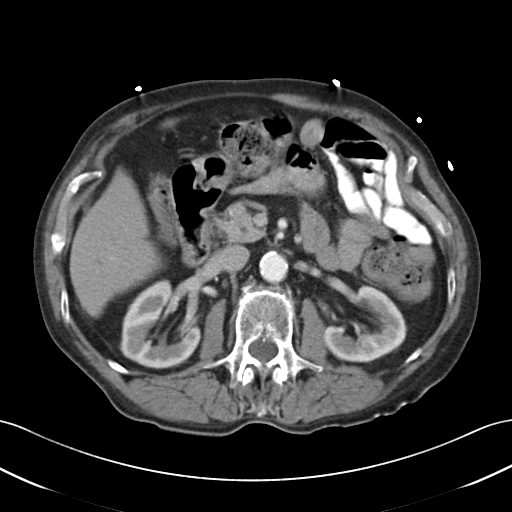
[im 59/86  bone]
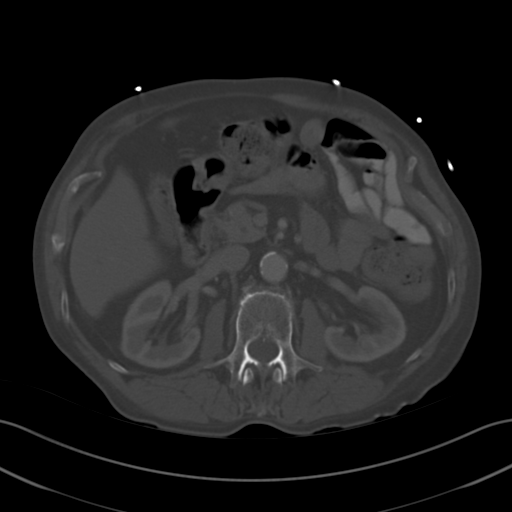
[im 68/86  soft-tissue]
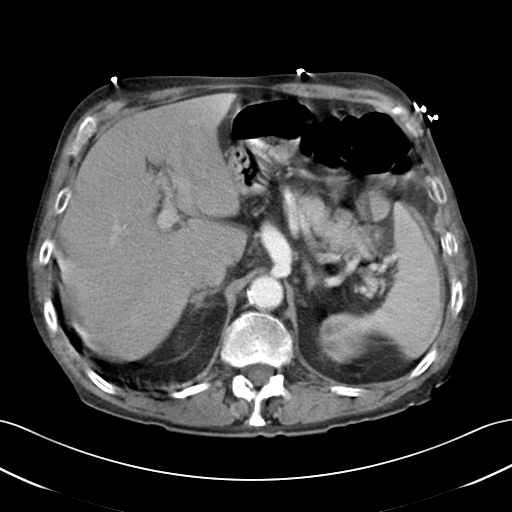
[im 72/86  soft-tissue]
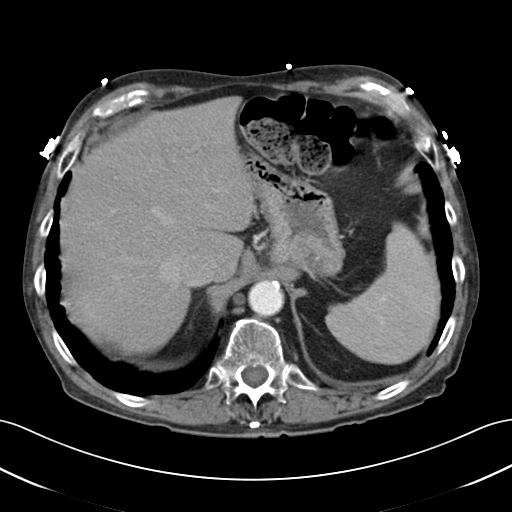
[im 81/86  soft-tissue]
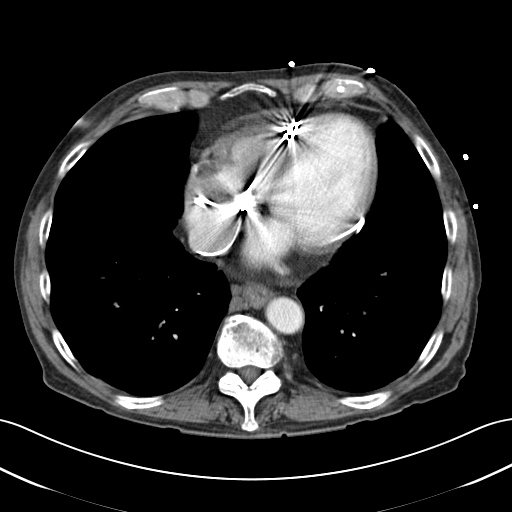

[Series 5: cor routine abd pel with · coronal · 0.64mm/px · 3 of 123 slices shown]
[im 41/123  soft-tissue]
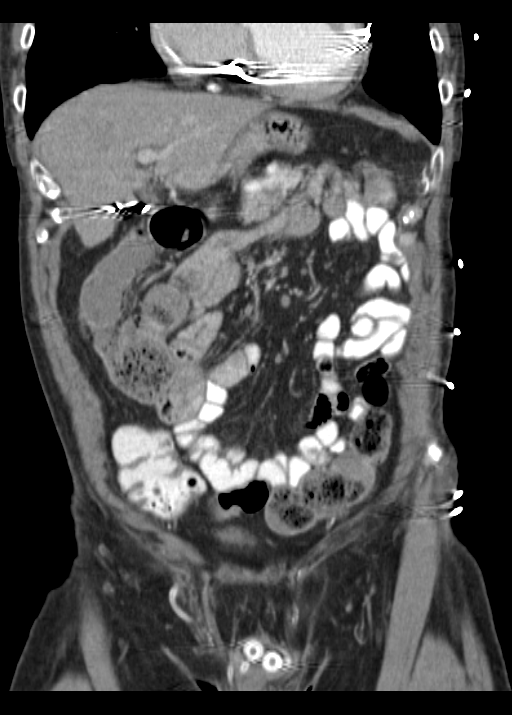
[im 55/123  soft-tissue]
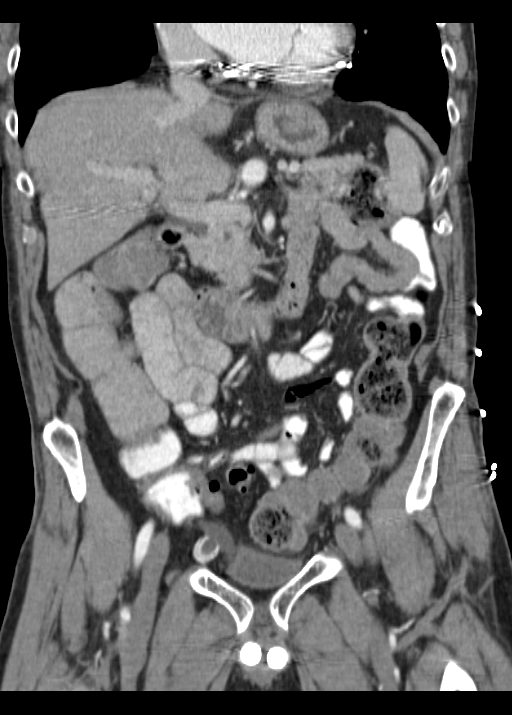
[im 68/123  soft-tissue]
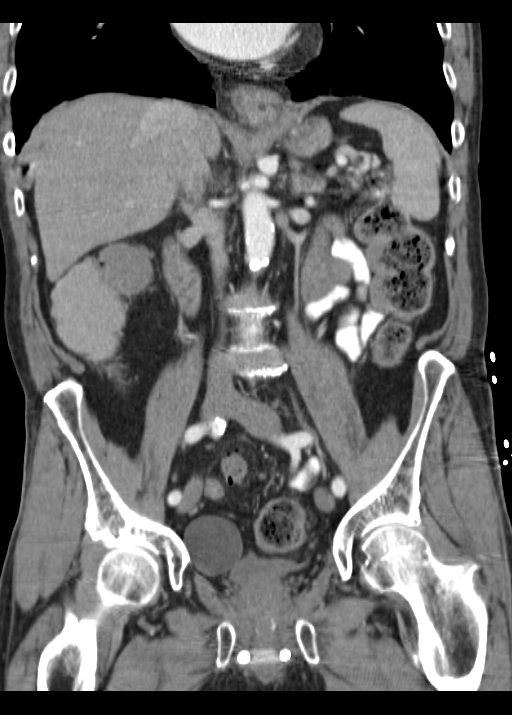

[15 of 46 positions shown; findings below may reference images not displayed]

FINDINGS: Limited view of the chest demonstrates mild to moderate
cardiomegaly, cardiac pacer wires in place. Pericardial thickening
without frank effusion. Lung bases are clear, minimal right middle
lobe scarring

Mild motion degraded examination of the solid abdominal organs. The
liver, spleen, pancreas and adrenal glands are grossly normal.
Status post cholecystectomy.

Small hiatal hernia. Stomach, small and large bowel are normal
course and caliber without inflammatory changes. Fluid filled
nondistended cecum with mild to moderate amount of retained large
bowel stool. Sigmoid diverticulosis without superimposed
inflammatory changes. Normal appendix. No intraperitoneal free fluid
nor free air.

Motion degraded examination of the kidneys on the initial phase,
though, the kidneys appear unremarkable on delayed phase, with
prompt symmetric excretion of contrast into the proximal urinary
collecting system. Aortoiliac vessels are normal course and caliber
with mild calcific atherosclerosis.

Urinary bladder is predominately decompressed and unremarkable.
Prostate is 3.8 cm in transaxial dimension. Penile implant with
reservoir in the right pelvis. Moderate left fat containing inguinal
hernia. Small fat containing umbilical hernia. Grade 1 L2-3
retrolisthesis without spondylolysis. Moderate to severe
degenerative change of the lumbar spine. Severe left and moderate
right sacroiliac osteoarthrosis.
IMPRESSION: Mild to moderate amount of retained large bowel stool, without bowel
obstruction. Sigmoid diverticulosis without acute diverticulitis.

Status post cholecystectomy.

  By: Sui Chung [place]

## 2014-07-30 NOTE — Telephone Encounter (Signed)
Telephoned pt and spoke with him, he is going to start a Prednisone 6 day taper tomorrow. Thus, scheduled him for INR check on Wednesday, 4 days after starting taper.

## 2014-07-30 NOTE — Op Note (Signed)
PATIENT NAME:  CARROL, BONDAR MR#:  [id number] DATE OF BIRTH:  [birth year]  DATE OF PROCEDURE:  05/29/2012  SURGEON: Janalee Dane, MD.   PREOPERATIVE DIAGNOSIS: Chronic sinusitis with possible right maxillary sinus mycetoma.   POSTOPERATIVE DIAGNOSIS: Chronic sinusitis with possible right maxillary sinus mycetoma.   PROCEDURES:  1. Image-guided sinus surgery (Stryker navigation).  2. Bilateral anterior and posterior ethmoidectomies with tissue removal.  3. Bilateral maxillary sinus antrostomies with tissue removal.   ANESTHESIA: Total amount of local used was 10 mL.   ESTIMATED BLOOD LOSS: 20 mL.   FINDINGS: The right maxillary sinus was involved with purulence and partially involved with a large mycetoma.   DESCRIPTION OF PROCEDURE: The image-guided sinus surgery system mask was attached and registration was carried out in the standard fashion using the appropriate fiduciary points. Calibration of the system was confirmed and extensive review of the CT scan in all three dimensions preoperatively and intraoperatively was carried out.  Each instrument was registered and confirmed for anatomic accuracy.   Attention was directed to the sinus surgery portion of the surgery. The left side was addressed first. The middle turbinate was taken down subtotally with a through-cutting forceps. The uncinate process was then identified and completely resected revealing the natural maxillary sinus ostium. The natural maxillary sinus ostium was progressively enlarged by removing tissue to create a large maxillary antrostomy, removing diseased tissue. A large mycetoma was encountered, and completely debrided. The maxillary antrum was then irrigated copiously with saline. Attention was directed to the ethmoid sinuses where bone and mucosal tissue from the ethmoid sinus was taken down from anterior to posterior preserving the skull base and lamina papyracea, removing diseased tissue. Attention was then  directed to the right side where an identical series of procedures was accomplished. Upon completion of the sinus surgery, the Surgiflo was placed. A total of 1 unit of Surgiflo was used.  Temporary Telfa pledgets were placed and tied over the columella to prevent dislodging.   The patient was then returned to anesthesia, allowed to emerge from anesthesia in the operating room, extubated, and taken to the recovery room in stable condition.   ____________________________ Lenna Sciara. Nadeen Landau, MD jmc:gb D: 05/29/2012 20:49:05 ET T: 05/30/2012 07:11:10 ET JOB#: [id number]  cc: Janalee Dane, MD, <Dictator> Nicholos Johns MD ELECTRONICALLY SIGNED 06/01/2012 17:19

## 2014-07-30 NOTE — Telephone Encounter (Signed)
New message      Pt is going to start prednisone in the morning-----pt is on coumadin.  Will he need his dosage adjusted?

## 2014-07-31 NOTE — H&P (Signed)
PATIENT NAME:  [name], [name] MR#:  [id number] DATE OF BIRTH:  [birth year]  DATE OF ADMISSION:  08/20/2013  PRIMARY CARE PHYSICIAN: Dr. Danise Mina.  CHIEF COMPLAINT: Intractable nausea and vomiting.   HISTORY OF PRESENT ILLNESS: This is a 81 year old male who presents to the hospital due to a week history of intractable nausea and vomiting. The patient denies any dysphagia or odynophagia. But says that every time he tries to eat or drink something, he gets quite nauseated and shortly thereafter he vomits. He has only been able to eat bites of Jell-O and maybe a few sips of water over the past few days to a week. He came to the Emergency Room a few days back and was thought to have a possible basal ganglia stroke. He was sent over to [facility] for further evaluation and had a workup done which was negative for any acute neurologic process or stroke. He was then discharged home from the Emergency Room at [facility]. He now continues to have persistent nausea, vomiting and, therefore, hospitalist services were contacted for further treatment and evaluation.   REVIEW OF SYSTEMS:  CONSTITUTIONAL: No documented fever. No weight gain. No weight loss.  EYES: No blurred or double vision.  ENT: No tinnitus. No postnasal drip. No redness of the oropharynx.  RESPIRATORY: No cough. No wheeze. No hemoptysis. No dyspnea.  CARDIOVASCULAR: No chest pain. No orthopnea. No palpitations. No syncope.  GASTROINTESTINAL: Positive nausea. Positive vomiting. No diarrhea. No abdominal pain. No melena or hematochezia.  GENITOURINARY: No dysuria and hematuria.  ENDOCRINE: No polyuria or nocturia, heat or cold intolerance. HEMATOLOGIC: No anemia. No bruising. No bleeding.  INTEGUMENTARY: No rashes. No lesions.  MUSCULOSKELETAL: No arthritis. No swelling. No gout.  NEUROLOGIC: No numbness. No tingling. No ataxia. No seizure-type activity.  PSYCHIATRIC: No anxiety. No insomnia. No ADD.   PAST MEDICAL HISTORY: Consistent with  chronic atrial fibrillation, cardiomyopathy, status post pacemaker/defibrillator placement. COPD. Hypertension.   ALLERGIES: ERYTHROMYCIN, FAMOTIDINE, OMEPRAZOLE, PROTONIX, ANY OTHER PROTON PUMP INHIBITORS. THE REACTION FROM THE PROTON PUMP INHIBITORS IS GI DISTRESS.   SOCIAL HISTORY: No smoking. No alcohol abuse. No illicit drug abuse. Lives at home with his wife.   FAMILY HISTORY: Both mother and father are deceased. Mother died from complications of old age. Father died from GI problems from possible bowel obstruction.   CURRENT MEDICATIONS: As follows: Advair on 100/50 one puff b.i.d., Coreg 6.25 mg b.i.d., Valium 2.5 mg at bedtime as needed, enalapril 5 mg 1 tab b.i.d., Flonase 1 spray to each nostril daily, Lasix 10 mg daily as needed, loratadine 10 mg daily, singular 10 mg daily as needed, Zofran 4 mg q.6 hours as needed, warfarin 5 mg on every day except on Mondays and Fridays 10 mg.   PHYSICAL EXAMINATION: Presently is as follows:  VITAL SIGNS:  The patient is afebrile, pulse 72, respirations 18, blood pressure 132/72, sats 95% on room air.  GENERAL: The patient is a pleasant-appearing male in no apparent distress. HEAD, EYES, EARS, NOSE AND THROAT:  Atraumatic, normocephalic. Extraocular muscles are intact. Pupils equal and react to light. Sclerae anicteric. No conjunctival injection. No pharyngeal erythema.  NECK: Supple. There is no jugular venous distention. No bruits. No lymphadenopathy. No thyromegaly.  HEART: Regular rate and rhythm. No murmurs, rubs. No clicks.  LUNGS: Clear to auscultation bilaterally. No rales or rhonchi. No wheezes.  ABDOMEN: Soft, flat, nontender, nondistended. Has good bowel sounds. No hepatosplenomegaly appreciated.  EXTREMITIES: No evidence of any cyanosis, clubbing or peripheral edema. Has +  2 pedal and radial pulses bilaterally.  NEUROLOGICAL: The patient is alert, awake and oriented x 3 with no focal motor or sensory deficits appreciated bilaterally.   SKIN: Moist and warm with no rashes appreciated.  LYMPHATIC: There is no cervical, axillary lymphadenopathy.   LABORATORY DATA: Showed a serum glucose of 95, BUN 14, creatinine 1.05. Sodium 130, potassium 3.8, chloride 97, bicarb 26. LFTs are within normal limits. White cell count is 6.8, hemoglobin 13.9, hematocrit 39.9, platelet count 154. Troponin 0.02. INR is 2.8. Urinalysis within normal limits.   The patient did have an abdominal 3-way done, which showed COPD with mild amount of retained large bowel stool without obstruction. The patient also had a CT scan of the abdomen and pelvis done with contrast showing mild to moderate amount of retained stool in the large bowel without bowel obstruction. Sigmoid diverticulosis without acute diverticulitis.   ASSESSMENT AND PLAN: This is an 81 year old male with a history of chronic atrial fibrillation, hypertension, chronic obstructive pulmonary disease, cardiomyopathy, status post automatic implantable cardiac defibrillator presents to the hospital due to intractable nausea, vomiting and not being able to eat anything for days.  1.  Intractable nausea, vomiting. The exact etiology of this is unclear. The patient's CT abdomen and pelvis shows no acute pathology. The patient does have a previous history of peptic ulcer disease and gastritis but is intolerant to any proton pump inhibitors. The patient can only take Carafate apparently. For now, I will continue supportive care with IV fluids and some Zofran as needed for nausea and vomiting. We will get a gastroenterology consult. The patient likely will need upper gastrointestinal endoscopy.  2.  Chronic atrial fibrillation. The patient is currently rate controlled. He is also status post pacemaker and automatic implantable cardiac defibrillator. I will continue his Coreg. He is already on Coumadin. His INR is therapeutic.  3.  Hypertension. Continue with his Coreg and continue with enalapril. 4.  History of  chronic obstructive pulmonary disease. No acute exacerbation. Continue his Advair and Singulair.   CODE STATUS: The patient is a FULL CODE.   TIME SPENT: 45 minutes.   ____________________________ Belia Heman. Verdell Carmine, MD vjs:ce D: 08/20/2013 18:40:54 ET T: 08/20/2013 19:00:30 ET JOB#: [id number]  cc: Belia Heman. Verdell Carmine, MD, <Dictator> Henreitta Leber MD ELECTRONICALLY SIGNED 09/02/2013 15:08

## 2014-07-31 NOTE — Consult Note (Signed)
Chief Complaint:  Subjective/Chief Complaint Pt had 2 episodes of nausea & vomiting yesterday.  No nausea & vomiting today.  Wanting to advance diet.  Hiccoughs resolved.  1 soft brown BM yesterday.   VITAL SIGNS/ANCILLARY NOTES: **Vital Signs.:   25-May-15 05:00  Vital Signs Type Routine  Temperature Temperature (F) 98.5  Celsius 36.9  Temperature Source oral  Pulse Pulse 65  Respirations Respirations 18  Systolic BP Systolic BP 94  Diastolic BP (mmHg) Diastolic BP (mmHg) 54  Mean BP 67  Pulse Ox % Pulse Ox % 92  Pulse Ox Activity Level  At rest  Oxygen Delivery Room Air/ 21 %   Brief Assessment:  GEN well developed, no acute distress, thin, A/Ox3, wife at bedside   Cardiac Regular   Respiratory normal resp effort   Gastrointestinal Normal   Gastrointestinal details normal Soft  Nontender  Nondistended  Bowel sounds normal  No rebound tenderness  No gaurding   EXTR negative cyanosis/clubbing, negative edema   Additional Physical Exam Skin: pink, warm, dry   Lab Results: Routine Chem:  25-May-15 06:28   Sodium, Serum  131 (Result(s) reported on 31 Aug 2013 at 07:00AM.)  Potassium, Serum 3.9 (Result(s) reported on 31 Aug 2013 at 07:00AM.)  Result Comment LABS - This specimen was collected through an   - indwelling catheter or arterial line.  - A minimum of 7mls of blood was wasted prior    - to collecting the sample.  Interpret  - results with caution.  Result(s) reported on 31 Aug 2013 at 07:00AM.  Magnesium, Serum 2.0 (1.8-2.4 THERAPEUTIC RANGE: 4-7 mg/dL TOXIC: > 10 mg/dL  -----------------------)  Phosphorus, Serum 3.7 (Result(s) reported on 31 Aug 2013 at [facility].)  Calcium (Total), Serum  8.2 (Result(s) reported on 31 Aug 2013 at 07:00AM.)  Routine Coag:  25-May-15 06:28   Prothrombin  20.1  INR 1.8 (INR reference interval applies to patients on anticoagulant therapy. A single INR therapeutic range for coumarins is not optimal for all indications;  however, the suggested range for most indications is 2.0 - 3.0. Exceptions to the INR Reference Range may include: Prosthetic heart valves, acute myocardial infarction, prevention of myocardial infarction, and combinations of aspirin and anticoagulant. The need for a higher or lower target INR must be assessed individually. Reference: The Pharmacology and Management of the Vitamin K  antagonists: the seventh ACCP Conference on Antithrombotic and Thrombolytic Therapy. OVZCH.8850 Sept:126 (3suppl): N9146842. A HCT value >55% may artifactually increase the PT.  In one study,  the increase was an average of 25%. Reference:  "Effect on Routine and Special Coagulation Testing Values of Citrate Anticoagulant Adjustment in Patients with High HCT Values." American Journal of Clinical Pathology 2006;126:400-405.)   Assessment/Plan:  Assessment/Plan:  Assessment Persistent hiccoughs:  Resolved on Baclofen & Solumedrol Nausea & vomiting:  Last episode yesterday   Plan 1) Advance to full liquid diet 2) Continue PPI 3) Continue solumedrol & baclofen [facility] to resume care tomorrow Please call if you have any questions or concerns   Electronic Signatures: Andria Meuse (NP)  (Signed 25-May-15 13:37)  Authored: Chief Complaint, VITAL SIGNS/ANCILLARY NOTES, Brief Assessment, Lab Results, Assessment/Plan   Last Updated: 25-May-15 13:37 by Andria Meuse (NP)

## 2014-07-31 NOTE — Consult Note (Signed)
Chief Complaint:  Subjective/Chief Complaint Anxious. Not getting enough sleep. Normally takes valium prn for sleep and anxiety. Still with nausea/vomiting. Vomited full liquids this morning.   VITAL SIGNS/ANCILLARY NOTES: **Vital Signs.:   16-May-15 04:03  Vital Signs Type Routine  Temperature Temperature (F) 99  Celsius 37.2  Temperature Source oral  Pulse Pulse 52  Respirations Respirations 20  Systolic BP Systolic BP 633  Diastolic BP (mmHg) Diastolic BP (mmHg) 67  Mean BP 81  Pulse Ox % Pulse Ox % 98  Pulse Ox Activity Level  At rest  Oxygen Delivery Room Air/ 21 %   Brief Assessment:  GEN no acute distress   Cardiac Regular   Respiratory clear BS   Lab Results: Routine Coag:  16-May-15 10:41   Prothrombin  29.0  INR 2.8 (INR reference interval applies to patients on anticoagulant therapy. A single INR therapeutic range for coumarins is not optimal for all indications; however, the suggested range for most indications is 2.0 - 3.0. Exceptions to the INR Reference Range may include: Prosthetic heart valves, acute myocardial infarction, prevention of myocardial infarction, and combinations of aspirin and anticoagulant. The need for a higher or lower target INR must be assessed individually. Reference: The Pharmacology and Management of the Vitamin K  antagonists: the seventh ACCP Conference on Antithrombotic and Thrombolytic Therapy. [id number].2563 Sept:126 (3suppl): [id number]. A HCT value >55% may artifactually increase the PT.  In one study,  the increase was an average of 25%. Reference:  "Effect on Routine and Special Coagulation Testing Values of Citrate Anticoagulant Adjustment in Patients with High HCT Values." American Journal of Clinical Pathology 2006;126:400-405.)  Routine Hem:  16-May-15 05:45   WBC (CBC) 5.2  RBC (CBC)  3.97  Hemoglobin (CBC)  11.6  Hematocrit (CBC)  33.4  Platelet Count (CBC)  114  MCV 84  MCH 29.1  MCHC 34.6  RDW  15.7   Neutrophil % 64.1  Lymphocyte % 23.5  Monocyte % 8.3  Eosinophil % 2.9  Basophil % 1.2  Neutrophil # 3.3  Lymphocyte # 1.2  Monocyte # 0.4  Eosinophil # 0.2  Basophil # 0.1 (Result(s) reported on 22 Aug 2013 at 06:14AM.)   Assessment/Plan:  Assessment/Plan:  Assessment N/V. Persists. INR still high.   Plan Would like to plan EGD by Monday if INR comes down. Continue carafate.   Electronic Signatures: Verdie Shire (MD)  (Signed 16-May-15 11:24)  Authored: Chief Complaint, VITAL SIGNS/ANCILLARY NOTES, Brief Assessment, Lab Results, Assessment/Plan   Last Updated: 16-May-15 11:24 by Verdie Shire (MD)

## 2014-07-31 NOTE — Consult Note (Signed)
General Aspect Pt has a long hx of dilated cardiomyopathy, has an ICD / pacer.  pt of Dr. [name].  EF is 20-25% according to wife. 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  he has not been able to eat since then.  he has been taking his meds ( until he was unable to swallow adequately)  no CP , no worsening dyspnea.   Present Illness he has had a rough 2 weeks.  has not been able to eat anything.  family is very concerned.   Physical Exam:  GEN cachectic, critically ill appearing   HEENT pink conjunctivae   NECK No masses  no obvious JVD   RESP normal resp effort  clear BS   CARD Regular rate and rhythm  no S3   ABD denies tenderness  soft  normal BS  no hepatomegaly, no HJR   LYMPH negative neck   EXTR negative cyanosis/clubbing, negative edema   NEURO cranial nerves intact   PSYCH sedated   Review of Systems:  Subjective/Chief Complaint severe nausea and hiccups with every inspiration   General: Weight loss or gain  weight loss.   Skin: No Complaints   ENT: No Complaints   Eyes: No Complaints   Neck: No Complaints   Respiratory: No Complaints   Cardiovascular: No Complaints   Gastrointestinal: Nausea  inability to et   Vascular: No Complaints   Musculoskeletal: No Complaints   Hematologic: No Complaints   Endocrine: No Complaints   Psychiatric: No Complaints     defibrillator:    pacemaker:    hypertension:    gall bladder removed:    hernia repair:    goiter removed:   Home Medications: Medication Instructions Status  carvedilol 6.25 mg oral tablet 1 tab(s) orally 2 times a day Active  enalapril 5 mg oral tablet 1 tab(s) orally 2 times a day Active  diazepam 5 mg oral tablet 0.25 tab(s) orally once a day (at bedtime), As Needed Active  furosemide 20 mg oral tablet 0.5 tab(s) orally once a day (in the morning) Active  loratadine 10 mg oral tablet 1  tab(s) orally once a day Active  montelukast 10 mg oral tablet 1 tab(s) orally once a day (in the evening), As Needed Active  ondansetron 4 mg oral tablet 1 tab(s) orally every 6 hours, As Needed Active  warfarin 5 mg oral tablet 2 tab(s) orally 2 times a week on monday and friday Active  warfarin 5 mg oral tablet 1 tab(s) orally once a day on all other days Active  fluticasone nasal 50 mcg/inh nasal spray 1 spray(s) nasal once a day Active  Advair Diskus 100 mcg-50 mcg inhalation powder 1 puff(s) inhaled 2 times a day, As Needed Active   Lab Results: Thyroid:  18-May-15 04:48   Thyroid Stimulating Hormone 1.41 (0.45-4.50 (International Unit)  ----------------------- Pregnant patients have  different reference  ranges for TSH:  - - - - - - - - - -  Pregnant, first trimetser:  0.36 - 2.50 uIU/mL)  LabObservation:  22-May-15 10:17   OBSERVATION Reason for Test  Hepatic:  14-May-15 12:20   Bilirubin, Total  1.3  Alkaline Phosphatase 62 (45-117 NOTE: New Reference Range 02/27/13)  SGPT (ALT) 21  SGOT (AST) 22  Total Protein, Serum 6.5  Albumin, Serum 3.6  20-May-15 06:33   Alkaline Phosphatase 45 (45-117 NOTE: New Reference Range 02/27/13)  SGOT (  AST) 30 (Result(s) reported on 26 Aug 2013 at 07:17AM.)  Total Protein, Serum  5.5 (Result(s) reported on 26 Aug 2013 at 07:17AM.)  Albumin, Serum  3.2  22-May-15 05:49   Bilirubin, Total 0.4  Bilirubin, Direct 0.2 (Result(s) reported on 28 Aug 2013 at 06:52AM.)  Alkaline Phosphatase  34 (45-117 NOTE: New Reference Range 02/27/13)  SGPT (ALT) 20  SGOT (AST) 27  Total Protein, Serum  4.9  Albumin, Serum  2.9  Pathology:  19-May-15 00:12   Pathology Report ========== TEST NAME ==========  ========= RESULTS =========  = REFERENCE RANGE =  PATHOLOGY REPORT  Pathology Report .                               [   Final Report         ]                   Material submitted:         Marland Kitchen GASTRIC BIOPSIES .                                [   Final Report         ]                   Pre-operative diagnosis:                                        . NAUSEA AND VOMITING .                               [   Final Report  ]                   Post-operative diagnosis:                                       . GASTRITIS, HIATAL HERNIA .                               [   Final Report         ]                   ********************************************************************** Diagnosis: GASTRIC BIOPSIES: - FUNDIC TYPE MUCOSA WITH FOCAL EDEMA AND MINIMAL CHRONIC GASTRITIS. - NEGATIVE FOR H.PYLORI, DYSPLASIA AND MALIGNANCY. XDB/08/26/2013 ********************************************************************** .                               [   Final Report         ]                   Electronically signed:                                     . Lum Babe, MD, Pathologist .                               [  Final Report         ]   Gross description:                                         . Received in a formalin filled container labeled Marvis Repress and gastric biopsies are two fragments of pink to tan tissue, 0.2 and 0.4 cm in greatest dimensions, filtered into mesh bag, entirely submitted in block A1. DEC/KCT .                               [   Final Report         ]                   Pathologist provided ICD-9: 535.50 .                               [   Final Report         ]                   CPT                                                      . [place]            No: [id number]           [address], [employer], [place] [address]           Lindon Romp, MD         [phone]                            [id number]   Result(s) reported on 26 Aug 2013 at 02:57PM.  General Ref:  20-May-15 06:33   Prealbumin ========== TEST NAME ==========  ========= RESULTS =========  = REFERENCE RANGE =  TPN PANEL  Prealbumin Prealbumin                       [   23 mg/dL             ]             20-40               [facility]            No: [id number]           [address], [place], [place] [address]           Lindon Romp, MD         [phone]   Result(s) reported on 27 Aug 2013 at 05:48AM.  Cardiology:  22-May-15 10:17   Echo Doppler REASON FOR EXAM:     COMMENTS:     PROCEDURE: [facility] - ECHO DOPPLER COMPLETE(TRANSTHOR)  - Aug 28 2013 10:17AM   RESULT: Echocardiogram Report  Patient Name:   [name] Date of Exam: 08/28/2013 Medical Rec #:  [id number]      Custom1: Date of Birth:  05-27-1931                 Height:       66.0 in Patient Age:    12 years                  Weight:       139.0 lb Patient Gender: M                         BSA:          1.71 m??  Indications: Right heart CHF Sonographer:    Sherrie Sport RDCS Referring Phys: Abel Presto, J  Summary:  1. Left ventricular ejection fraction, by visual estimation, is 20 to  25%.  2. Severely decreased global left ventricular systolic function.  3. Moderate to severely increased left ventricular internal cavity size.  4. Impaired relaxation pattern of LV diastolic filling.  5. Severely dilated left atrium.  6. Trivial pericardial effusion, as described above.  7. Thickening of the posterior mitral valve leaflet.  8. Mild aortic regurgitation.  9. Mildly elevated pulmonary artery systolic pressure. 10. Moderately increased left ventricular posterior wall thickness. 11. Mild tricuspid regurgitation. 12. The estimated PA pressure is moderately elevated - 54 mm Hg. 2D AND M-MODE MEASUREMENTS (normal ranges within parentheses): Left Ventricle:          Normal IVSd (2D):      0.82 cm (0.7-1.1) LVPWd (2D):     1.43 cm (0.7-1.1) Aorta/LA:                  Normal LVIDd (2D):     6.20 cm (3.4-5.7) Aortic Root (2D): 2.50 cm (2.4-3.7) LVIDs (2D):     5.91 cm           Left Atrium (2D): 5.70 cm (1.9-4.0) LV FS (2D):      4.7 %   (>25%) LV  EF (2D):     10.4 %   (>50%)                                   Right Ventricle:                                   RVd(2D):        1.50 cm LV SYSTOLIC FUNCTION BY 2D PLANIMETRY (MOD): EF-A4C View: 15.0 % EF-A2C View: 22.1 % EF-Biplane: 56.9 % LV DIASTOLIC FUNCTION: MV Peak E: 1.12 m/s E/e' Ratio: 14.20 MV Peak A: 0.61 m/s Decel Time: 162 msec E/A Ratio: 1.83 SPECTRAL DOPPLER ANALYSIS (where applicable): Mitral Valve: MV P1/2 Time: 46.98 msec MV Area, PHT: 4.68 cm?? Aortic Valve: AoV Max Vel: 1.17 m/s AoV Peak PG: 5.5 mmHg AoV Mean PG:  3.0 mmHg LVOT Vmax: 1.20 m/s LVOT VTI: 0.269 m LVOT Diameter: 2.10 cm AoV Area, Vmax: 3.54 cm?? AoV Area, VTI: 3.96 cm?? AoV Area, Vmn: 3.57 cm?? Aortic Insufficiency: AI Half-time:  620 msec AI Decel Rate: 1.74 m/s?? Tricuspid Valve and PA/RV Systolic Pressure: TR Max Velocity: 3.13 m/s RA  Pressure: 5 mmHg RVSP/PASP: 44.1 mmHg Pulmonic Valve: PV Max Velocity: 0.95 m/s PV Max PG: 3.6 mmHg PV Mean PG:  PHYSICIAN INTERPRETATION: Left Ventricle: The left ventricular internal cavity size was moderate to  severely increased.  LV septal wall thickness was normal. LV posterior  wall thickness was moderately increased. Global LV systolic function was  severely decreased. Left ventricular ejection fraction, by visual  estimation, is 20 to 25%. Spectral Doppler shows impaired relaxation  pattern of LV diastolic filling. Right Ventricle: The right ventricular size is normal. Global RV systolic  function is normal. Left Atrium: The left atrium is severely dilated. Right Atrium: The right atrium is normal in size. Pericardium: Trivial pericardial effusion is present. Mitral Valve: There is thickening of the posterior mitral valve leaflet.  Trace mitral valve regurgitation is seen. Tricuspid Valve: The tricuspid valve is normal. Mild tricuspid  regurgitation is visualized. The tricuspid regurgitant velocity is 3.13  m/s, and with an assumed right atrial  pressure of 5 mmHg, the estimated  right ventricular systolic pressure is mildly elevated at 44.1 mmHg. Aortic Valve: The aortic valve is normal. The aortic valve is  structurally normal, with no evidence of sclerosis or stenosis. Mild  aortic valve regurgitation is seen. Aorta: The aortic root is normal in size and structure. Pulmonary Artery: The estimated PA pressure is moderately elevated - 54  mm Hg.  [id number] Mertie Moores Electronically signed by [id number] Mertie Moores Signature Date/Time: 08/28/2013/1:14:12 PM  *** Final ***  IMPRESSION: .    Verified By: Wonda Cheng. Acie Fredrickson, M.D., MD  Routine Chem:  14-May-15 12:20   Potassium, Serum 3.8  Sodium, Serum  130  Calcium (Total), Serum 8.7  Glucose, Serum 95  BUN 14  Creatinine (comp) 1.05  Chloride, Serum  97  CO2, Serum 26  Anion Gap 7  Osmolality (calc) 261  eGFR (African American) >60  eGFR (Non-African American) >60 (eGFR values <27mL/min/1.73 m2 may be an indication of chronic kidney disease (CKD). Calculated eGFR is useful in patients with stable renal function. The eGFR calculation will not be reliable in acutely ill patients when serum creatinine is changing rapidly. It is not useful in  patients on dialysis. The eGFR calculation may not be applicable to patients at the low and high extremes of body sizes, pregnant women, and vegetarians.)  Lipase 182 (Result(s) reported on 20 Aug 2013 at 12:49PM.)  15-May-15 05:19   Potassium, Serum 4.0  Sodium, Serum 137  Calcium (Total), Serum  8.1  Glucose, Serum 87  BUN 12  Creatinine (comp) 0.99  Chloride, Serum 106  CO2, Serum 24  Anion Gap 7  Osmolality (calc) 273  eGFR (African American) >60  eGFR (Non-African American) >60 (eGFR values <18mL/min/1.73 m2 may be an indication of chronic kidney disease (CKD). Calculated eGFR is useful in patients with stable renal function. The eGFR calculation will not be reliable in acutely ill patients when serum creatinine is  changing rapidly. It is not useful in  patients on dialysis. The eGFR calculation may not be applicable to patients at the low and high extremes of body sizes, pregnant women, and vegetarians.)  19-May-15 15:39   Potassium, Serum 3.5 (Result(s) reported on 25 Aug 2013 at 04:24PM.)  Sodium, Serum  135 (Result(s) reported on 25 Aug 2013 at 04:24PM.)  Magnesium, Serum  1.4 (1.8-2.4 THERAPEUTIC RANGE: 4-7 mg/dL TOXIC: > 10 mg/dL  -----------------------)  Phosphorus, Serum 2.7 (Result(s) reported on 25 Aug 2013 at 04:11PM.)  Calcium (Total), Serum  8.3 (Result(s) reported on 25 Aug 2013 at 04:24PM.)  Creatinine (comp) 0.89  eGFR (African American) >60  eGFR (Non-African American) >60 (eGFR values <61mL/min/1.73 m2 may be an indication of chronic kidney disease (CKD). Calculated eGFR is useful in  patients with stable renal function. The eGFR calculation will not be reliable in acutely ill patients when serum creatinine is changing rapidly. It is not useful in  patients on dialysis. The eGFR calculation may not be applicable to patients at the low and high extremes of body sizes, pregnant women, and vegetarians.)  20-May-15 06:33   Potassium, Serum  3.0  Potassium, Serum -  Sodium, Serum  134  Sodium, Serum -  Magnesium, Serum 2.0 (1.8-2.4 THERAPEUTIC RANGE: 4-7 mg/dL TOXIC: > 10 mg/dL  -----------------------)  Phosphorus, Serum  2.2  Calcium (Total), Serum  8.0  Calcium (Total), Serum -  Result Comment LABS - DUPLICATE TESTS. TPN PANEL IS ALREADY  - ORDERED ON THIS PATIENT...TPL  Result(s) reported on 26 Aug 2013 at 06:42AM.  Glucose, Serum  128  Glucose, Serum -  BUN 13  BUN -  Creatinine (comp) 1.01  Creatinine (comp) -  Chloride, Serum 101  Chloride, Serum -  CO2, Serum 26  CO2, Serum -  Anion Gap 7  Anion Gap -  Osmolality (calc) 270  Osmolality (calc) -  eGFR (African American) >60  eGFR (African American) -  eGFR (Non-African American) >60 (eGFR values  <89mL/min/1.73 m2 may be an indication of chronic kidney disease (CKD). Calculated eGFR is useful in patients with stable renal function. The eGFR calculation will not be reliable in acutely ill patients when serum creatinine is changing rapidly. It is not useful in  patients on dialysis. The eGFR calculation may not be applicable to patients at the low and high extremes of body sizes, pregnant women, and vegetarians.)  eGFR (Non-African American) - (eGFR values <63mL/min/1.73 m2 may be an indication of chronic kidney disease (CKD). Calculated eGFR is useful in patients with stable renal function. The eGFR calculation will not be reliable in acutely ill patients when serum creatinine is changing rapidly. It is not useful in  patients on dialysis. The eGFR calculation may not be applicable to patients at the low and high extremes of body sizes, pregnant women, and vegetarians.)  Cholesterol, Serum 103 (Result(s) reported on 26 Aug 2013 at 07:17AM.)  Triglycerides, Serum 102 (Result(s) reported on 26 Aug 2013 at 07:17AM.)  21-May-15 04:14   Potassium, Serum 4.5  Sodium, Serum  132  Magnesium, Serum 2.0 (1.8-2.4 THERAPEUTIC RANGE: 4-7 mg/dL TOXIC: > 10 mg/dL  -----------------------)  Phosphorus, Serum  5.3 (Result(s) reported on 27 Aug 2013 at 05:13AM.)  Calcium (Total), Serum  8.1  Result Comment labs - This specimen was collected through an   - indwelling catheter or arterial line.  - A minimum of 50mls of blood was wasted prior    - to collecting the sample.  Interpret  - results with caution. GLUCOSE - RESULTS VERIFIED BY REPEAT TESTING.  - NOTIFIED OF CRITICAL VALUE  - MEGAN WERNER $[email or web address]'@[id number]'[email or web address]$  08-27-13 BY [facility]  - READ-BACK PROCESS PERFORMED.  Result(s) reported on 27 Aug 2013 at 05:45AM.  Glucose, Serum  530  BUN 13  Creatinine (comp) 0.96  Chloride, Serum  96  CO2, Serum 28  Anion Gap 8  Osmolality (calc) 289  eGFR (African American) >60  eGFR (Non-African American) >60 (eGFR  values <53mL/min/1.73 m2 may be an indication of chronic kidney disease (CKD). Calculated eGFR is useful in patients with stable renal function. The eGFR calculation will not be reliable in acutely ill patients when serum creatinine is changing rapidly. It is not useful in  patients on dialysis. The eGFR calculation may not be applicable to patients at  the low and high extremes of body sizes, pregnant women, and vegetarians.)    05:55   Result Comment labs - This specimen was collected through an   - indwelling catheter or arterial line.  - A minimum of 24mls of blood was wasted prior    - to collecting the sample.  Interpret  - results with caution.  Result(s) reported on 27 Aug 2013 at 06:29AM.  Glucose, Serum  130    14:24   Phosphorus, Serum 2.8 (Result(s) reported on 27 Aug 2013 at 03:00PM.)  22-May-15 05:49   Potassium, Serum  3.1 (Result(s) reported on 28 Aug 2013 at 06:47AM.)  Sodium, Serum 136 (Result(s) reported on 28 Aug 2013 at 06:47AM.)  Magnesium, Serum 1.9 (1.8-2.4 THERAPEUTIC RANGE: 4-7 mg/dL TOXIC: > 10 mg/dL  -----------------------)  Phosphorus, Serum 3.2 (Result(s) reported on 28 Aug 2013 at [facility].)  Calcium (Total), Serum  8.2 (Result(s) reported on 28 Aug 2013 at 06:47AM.)  Result Comment LABS - This specimen was collected through an   - indwelling catheter or arterial line.  - A minimum of 57mls of blood was wasted prior    - to collecting the sample.  Interpret  - results with caution.  Result(s) reported on 28 Aug 2013 at 06:29AM.  Cardiac:  14-May-15 12:20   CK, Total 58 (39-308 NOTE: NEW REFERENCE RANGE  05/11/2013)  CPK-MB, Serum  3.7 (Result(s) reported on 20 Aug 2013 at 12:56PM.)  Troponin I < 0.02 (0.00-0.05 0.05 ng/mL or less: NEGATIVE  Repeat testing in 3-6 hrs  if clinically indicated. >0.05 ng/mL: POTENTIAL  MYOCARDIAL INJURY. Repeat  testing in 3-6 hrs if  clinically indicated. NOTE: An increase or decrease  of 30% or more on  serial  testing suggests a  clinically important change)  Routine UA:  14-May-15 12:20   Color (UA) Yellow  Clarity (UA) Clear  Glucose (UA) Negative  Bilirubin (UA) Negative  Ketones (UA) Trace  Specific Gravity (UA) 1.010  Blood (UA) 1+  pH (UA) 7.0  Protein (UA) Negative  Nitrite (UA) Negative  Leukocyte Esterase (UA) Negative (Result(s) reported on 20 Aug 2013 at 02:27PM.)  RBC (UA) <1 /HPF  WBC (UA) <1 /HPF  Bacteria (UA) NONE SEEN  Epithelial Cells (UA) <1 /HPF (Result(s) reported on 20 Aug 2013 at 02:27PM.)  Routine Coag:  14-May-15 12:20   Prothrombin  28.8  INR 2.8 (INR reference interval applies to patients on anticoagulant therapy. A single INR therapeutic range for coumarins is not optimal for all indications; however, the suggested range for most indications is 2.0 - 3.0. Exceptions to the INR Reference Range may include: Prosthetic heart valves, acute myocardial infarction, prevention of myocardial infarction, and combinations of aspirin and anticoagulant. The need for a higher or lower target INR must be assessed individually. Reference: The Pharmacology and Management of the Vitamin K  antagonists: the seventh ACCP Conference on Antithrombotic and Thrombolytic Therapy. [id number] Sept:126 (3suppl): [id number]. A HCT value >55% may artifactually increase the PT.  In one study,  the increase was an average of 25%. Reference:  "Effect on Routine and Special Coagulation Testing Values of Citrate Anticoagulant Adjustment in Patients with High HCT Values." American Journal of Clinical Pathology 2006;126:400-405.)  16-May-15 10:41   Prothrombin  29.0  INR 2.8 (INR reference interval applies to patients on anticoagulant therapy. A single INR therapeutic range for coumarins is not optimal for all indications; however, the suggested range for most indications is 2.0 - 3.0. Exceptions to the INR Reference Range  may include: Prosthetic heart valves, acute myocardial  infarction, prevention of myocardial infarction, and combinations of aspirin and anticoagulant. The need for a higher or lower target INR must be assessed individually. Reference: The Pharmacology and Management of the Vitamin K  antagonists: the seventh ACCP Conference on Antithrombotic and Thrombolytic Therapy. [id number] Sept:126 (3suppl): [id number]. A HCT value >55% may artifactually increase the PT.  In one study,  the increase was an average of 25%. Reference:  "Effect on Routine and Special Coagulation Testing Values of Citrate Anticoagulant Adjustment in Patients with High HCT Values." American Journal of Clinical Pathology 2006;126:400-405.)  17-May-15 05:15   Prothrombin  29.5  INR 2.9 (INR reference interval applies to patients on anticoagulant therapy. A single INR therapeutic range for coumarins is not optimal for all indications; however, the suggested range for most indications is 2.0 - 3.0. Exceptions to the INR Reference Range may include: Prosthetic heart valves, acute myocardial infarction, prevention of myocardial infarction, and combinations of aspirin and anticoagulant. The need for a higher or lower target INR must be assessed individually. Reference: The Pharmacology and Management of the Vitamin K  antagonists: the seventh ACCP Conference on Antithrombotic and Thrombolytic Therapy. [id number] Sept:126 (3suppl): [id number]. A HCT value >55% may artifactually increase the PT.  In one study,  the increase was an average of 25%. Reference:  "Effect on Routine and Special Coagulation Testing Values of Citrate Anticoagulant Adjustment in Patients with High HCT Values." American Journal of Clinical Pathology 2006;126:400-405.)  18-May-15 04:48   Prothrombin  19.0  INR 1.6 (INR reference interval applies to patients on anticoagulant therapy. A single INR therapeutic range for coumarins is not optimal for all indications; however, the suggested range for most  indications is 2.0 - 3.0. Exceptions to the INR Reference Range may include: Prosthetic heart valves, acute myocardial infarction, prevention of myocardial infarction, and combinations of aspirin and anticoagulant. The need for a higher or lower target INR must be assessed individually. Reference: The Pharmacology and Management of the Vitamin K  antagonists: the seventh ACCP Conference on Antithrombotic and Thrombolytic Therapy. [id number] Sept:126 (3suppl): [id number]. A HCT value >55% may artifactually increase the PT.  In one study,  the increase was an average of 25%. Reference:  "Effect on Routine and Special Coagulation Testing Values of Citrate Anticoagulant Adjustment in Patients with High HCT Values." American Journal of Clinical Pathology 2006;126:400-405.)  20-May-15 06:33   Prothrombin  15.6  Prothrombin -  INR 1.3 (INR reference interval applies to patients on anticoagulant therapy. A single INR therapeutic range for coumarins is not optimal for all indications; however, the suggested range for most indications is 2.0 - 3.0. Exceptions to the INR Reference Range may include: Prosthetic heart valves, acute myocardial infarction, prevention of myocardial infarction, and combinations of aspirin and anticoagulant. The need for a higher or lower target INR must be assessed individually. Reference: The Pharmacology and Management of the Vitamin K  antagonists: the seventh ACCP Conference on Antithrombotic and Thrombolytic Therapy. [id number] Sept:126 (3suppl): [id number]. A HCT value >55% may artifactually increase the PT.  In one study,  the increase was an average of 25%. Reference:  "Effect on Routine and Special Coagulation Testing Values of Citrate Anticoagulant Adjustment in Patients with High HCT Values." American Journal of Clinical Pathology 2006;126:400-405.)  INR - (INR reference interval applies to patients on anticoagulant therapy. A single INR therapeutic range  for coumarins is not optimal for all indications; however, the suggested range for most indications is 2.0 - 3.0.  Exceptions to the INR Reference Range may include: Prosthetic heart valves, acute myocardial infarction, prevention of myocardial infarction, and combinations of aspirin and anticoagulant. The need for a higher or lower target INR must be assessed individually. Reference: The Pharmacology and Management of the Vitamin K  antagonists: the seventh ACCP Conference on Antithrombotic and Thrombolytic Therapy. [id number] Sept:126 (3suppl): [id number]. A HCT value >55% may artifactually increase the PT.  In one study,  the increase was an average of 25%. Reference:  "Effect on Routine and Special Coagulation Testing Values of Citrate Anticoagulant Adjustment in Patients with High HCT Values." American Journal of Clinical Pathology 2006;126:400-405.)  Activated PTT (APTT) 28.9 (A HCT value >55% may artifactually increase the APTT. In one study, the increase was an average of 19%. Reference: "Effect on Routine and Special Coagulation Testing Values of Citrate Anticoagulant Adjustment in Patients with High HCT Values." American Journal of Clinical Pathology 2006;126:400-405.)  21-May-15 14:24   Prothrombin  15.9  INR 1.3 (INR reference interval applies to patients on anticoagulant therapy. A single INR therapeutic range for coumarins is not optimal for all indications; however, the suggested range for most indications is 2.0 - 3.0. Exceptions to the INR Reference Range may include: Prosthetic heart valves, acute myocardial infarction, prevention of myocardial infarction, and combinations of aspirin and anticoagulant. The need for a higher or lower target INR must be assessed individually. Reference: The Pharmacology and Management of the Vitamin K  antagonists: the seventh ACCP Conference on Antithrombotic and Thrombolytic Therapy. [id number] Sept:126 (3suppl): [id number]. A HCT  value >55% may artifactually increase the PT.  In one study,  the increase was an average of 25%. Reference:  "Effect on Routine and Special Coagulation Testing Values of Citrate Anticoagulant Adjustment in Patients with High HCT Values." American Journal of Clinical Pathology 2006;126:400-405.)  22-May-15 05:49   Prothrombin  15.8  INR 1.3 (INR reference interval applies to patients on anticoagulant therapy. A single INR therapeutic range for coumarins is not optimal for all indications; however, the suggested range for most indications is 2.0 - 3.0. Exceptions to the INR Reference Range may include: Prosthetic heart valves, acute myocardial infarction, prevention of myocardial infarction, and combinations of aspirin and anticoagulant. The need for a higher or lower target INR must be assessed individually. Reference: The Pharmacology and Management of the Vitamin K  antagonists: the seventh ACCP Conference on Antithrombotic and Thrombolytic Therapy. [id number] Sept:126 (3suppl): [id number]. A HCT value >55% may artifactually increase the PT.  In one study,  the increase was an average of 25%. Reference:  "Effect on Routine and Special Coagulation Testing Values of Citrate Anticoagulant Adjustment in Patients with High HCT Values." American Journal of Clinical Pathology 2006;126:400-405.)  Routine Hem:  14-May-15 12:20   WBC (CBC) 6.8  Hemoglobin (CBC) 13.9  Platelet Count (CBC) 154 (Result(s) reported on 20 Aug 2013 at 12:36PM.)  Hematocrit (CBC)  39.9  RBC (CBC) 4.77  MCV 84  MCH 29.1  MCHC 34.8  RDW  15.6  15-May-15 05:19   WBC (CBC) 5.0  Hemoglobin (CBC)  12.1  Platelet Count (CBC)  117  Hematocrit (CBC)  35.1  RBC (CBC)  4.20  MCV 84  MCH 29.0  MCHC 34.6  RDW  15.6  Neutrophil % 65.2  Lymphocyte % 22.8  Monocyte % 8.4  Eosinophil % 2.2  Basophil % 1.4  Neutrophil # 3.2  Lymphocyte # 1.1  Monocyte # 0.4  Eosinophil # 0.1  Basophil # 0.1 (Result(s) reported on  21 Aug 2013 at 05:59AM.)  16-May-15 05:45   WBC (CBC) 5.2  Hemoglobin (CBC)  11.6  Platelet Count (CBC)  114  Hematocrit (CBC)  33.4  RBC (CBC)  3.97  MCV 84  MCH 29.1  MCHC 34.6  RDW  15.7  Neutrophil % 64.1  Lymphocyte % 23.5  Monocyte % 8.3  Eosinophil % 2.9  Basophil % 1.2  Neutrophil # 3.3  Lymphocyte # 1.2  Monocyte # 0.4  Eosinophil # 0.2  Basophil # 0.1 (Result(s) reported on 22 Aug 2013 at 06:14AM.)  19-May-15 19:56   Hematocrit (CBC)  32.2 (Result(s) reported on 25 Aug 2013 at 08:22PM.)  20-May-15 06:33   WBC (CBC) 6.9 (Result(s) reported on 26 Aug 2013 at 08:50AM.)  Hemoglobin (CBC)  11.9 (Result(s) reported on 26 Aug 2013 at 08:50AM.)  Platelet Count (CBC)  136 (Result(s) reported on 26 Aug 2013 at 08:50AM.)    Penicillin: Rash  Omeprazole: GI Distress  Famotidine: GI Distress  proton pump inhibitors: GI Distress  Ranitidine: GI Distress  Other- Explain in Comments Line: Other  Clarithromycin: Unknown  oxytetracycline ophthalmic: Unknown  Vital Signs/Nurse's Notes: **Vital Signs.:   22-May-15 04:35  Vital Signs Type Routine  Temperature Temperature (F) 98.3  Celsius 36.8  Temperature Source oral  Pulse Pulse 60  Respirations Respirations 18  Systolic BP Systolic BP 254  Diastolic BP (mmHg) Diastolic BP (mmHg) 75  Mean BP 93  Pulse Ox % Pulse Ox % 95  Pulse Ox Activity Level  At rest  Oxygen Delivery Room Air/ 21 %    08:25  Vital Signs Type Pre Medication  Pulse Pulse 59  Systolic BP Systolic BP 270  Diastolic BP (mmHg) Diastolic BP (mmHg) 58  Mean BP 82    14:37  Vital Signs Type Routine  Temperature Temperature (F) 98.9  Celsius 37.1  Temperature Source oral  Pulse Pulse 63  Respirations Respirations 18  Systolic BP Systolic BP 623  Diastolic BP (mmHg) Diastolic BP (mmHg) 75  Mean BP 91  Pulse Ox % Pulse Ox % 96  Pulse Ox Activity Level  At rest  Oxygen Delivery Room Air/ 21 %    Impression Mr. Kost has a long hx of severe  systolic CHF.  the wife recalls that his EF is ~25-30%.  I could not find an echo on the systome ( none since 2012) .  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  2 weeks ago, he developed GI issues - nausea, hiccups,  inability to eat .  At CT was performed which showed some reflux of the IV contrast back into the IVC ( sussestive of right hear failure)  The echo today shows an EF of 20-25% ( slightly lower than the previous one).  he did have moderate pulmonary HTN but his RV contracted well and was not enlarged.   he is able to lie flat in bed without any distress, no ankle  edema.  no S3 on exam.  I have discussed the case briefly with Dr. Haroldine Laws.  he did comment that some patients with severe right heart failure can present with GI symptoms  we will get a BMP.  I would like to get a mixed venous O2 ( ABG drawn throgh a  PICC line)   this would be helpful to get an idea of his cardiac output.    he has been found to have gastroparesis which may explain his symptoms.   The GI  team is tryng reglan and other GI meds.  Our team will follow .   Electronic Signatures: Elveta Rape, Dreama Saa (MD)  (Signed [phone] 17:26)  Authored: General Aspect/Present Illness, History and Physical Exam, Review of System, Past Medical History, Home Medications, Labs, Allergies, Vital Signs/Nurse's Notes, Impression/Plan   Last Updated: 22-May-15 17:26 by Kinza Gouveia, Dreama Saa (MD)

## 2014-07-31 NOTE — Consult Note (Signed)
Chief Complaint:  Subjective/Chief Complaint Pt had few large BM's after bowel prep through NG. Pt does feel somewhat better. Wants NG out.   VITAL SIGNS/ANCILLARY NOTES: **Vital Signs.:   20-May-15 04:20  Vital Signs Type Routine  Temperature Temperature (F) 99.2  Celsius 37.3  Temperature Source oral  Pulse Pulse 59  Respirations Respirations 18  Systolic BP Systolic BP 409  Diastolic BP (mmHg) Diastolic BP (mmHg) 62  Mean BP 78  Pulse Ox % Pulse Ox % 96  Pulse Ox Activity Level  At rest  Oxygen Delivery Room Air/ 21 %   Brief Assessment:  GEN no acute distress   Cardiac Regular   Respiratory clear BS   Gastrointestinal Normal   Lab Results: Hepatic:  20-May-15 06:33   SGOT (AST) 30 (Result(s) reported on 26 Aug 2013 at 08:50AM.)  Alkaline Phosphatase 45 (45-117 NOTE: New Reference Range 02/27/13)  Albumin, Serum  3.2  Total Protein, Serum  5.5 (Result(s) reported on 26 Aug 2013 at 08:50AM.)  General Ref:  20-May-15 06:33   Prealbumin PREALBUMIN   Result(s) reported on 26 Aug 2013 at 08:50AM.  Routine Chem:  20-May-15 06:33   Phosphorus, Serum  2.2  Magnesium, Serum 2.0 (1.8-2.4 THERAPEUTIC RANGE: 4-7 mg/dL TOXIC: > 10 mg/dL  -----------------------)  Cholesterol, Serum 103 (Result(s) reported on 26 Aug 2013 at 08:50AM.)  Triglycerides, Serum 102 (Result(s) reported on 26 Aug 2013 at 08:50AM.)  Glucose, Serum  128  BUN 13  Creatinine (comp) 1.01  Sodium, Serum  134  Potassium, Serum  3.0  Chloride, Serum 101  CO2, Serum 26  Anion Gap 7  Osmolality (calc) 270  Calcium (Total), Serum  8.0  eGFR (African American) >60  eGFR (Non-African American) >60 (eGFR values <59m/min/1.73 m2 may be an indication of chronic kidney disease (CKD). Calculated eGFR is useful in patients with stable renal function. The eGFR calculation will not be reliable in acutely ill patients when serum creatinine is changing rapidly. It is not useful in  patients on dialysis.  The eGFR calculation may not be applicable to patients at the low and high extremes of body sizes, pregnant women, and vegetarians.)  Result Comment LABS - DUPLICATE TESTS. TPN PANEL IS ALREADY  - ORDERED ON THIS PATIENT...TPL  Result(s) reported on 26 Aug 2013 at 06:42AM.  Routine Coag:  20-May-15 06:33   Prothrombin  15.6  INR 1.3 (INR reference interval applies to patients on anticoagulant therapy. A single INR therapeutic range for coumarins is not optimal for all indications; however, the suggested range for most indications is 2.0 - 3.0. Exceptions to the INR Reference Range may include: Prosthetic heart valves, acute myocardial infarction, prevention of myocardial infarction, and combinations of aspirin and anticoagulant. The need for a higher or lower target INR must be assessed individually. Reference: The Pharmacology and Management of the Vitamin K  antagonists: the seventh ACCP Conference on Antithrombotic and Thrombolytic Therapy. [id number]:126 (3suppl): [id number] A HCT value >55% may artifactually increase the PT.  In one study,  the increase was an average of 25%. Reference:  "Effect on Routine and Special Coagulation Testing Values of Citrate Anticoagulant Adjustment in Patients with High HCT Values." American Journal of Clinical Pathology 2006;126:400-405.)  Activated PTT (APTT) 28.9 (A HCT value >55% may artifactually increase the APTT. In one study, the increase was an average of 19%. Reference: "Effect on Routine and Special Coagulation Testing Values of Citrate Anticoagulant Adjustment in Patients with High HCT Values." American Journal of Clinical Pathology 2006;126:400-405.)  Routine Hem:  20-May-15 06:33   WBC (CBC) 6.9 (Result(s) reported on 26 Aug 2013 at 08:50AM.)  Hemoglobin (CBC)  11.9 (Result(s) reported on 26 Aug 2013 at 08:50AM.)  Platelet Count (CBC)  136 (Result(s) reported on 26 Aug 2013 at 08:50AM.)   Radiology Results: XRay:     19-May-15 16:31, Abdomen Complete 3 or more views of abd  Abdomen Complete 3 or more views of abd   REASON FOR EXAM:    Vomiting, Verify NG tube placement  COMMENTS:       PROCEDURE: DXR - DXR ABDOMEN COMPLETE  - Aug 25 2013  4:31PM     CLINICAL DATA:  Vomiting, evaluate NG tube placement    EXAM:  ABDOMEN SERIES    COMPARISON:  Chest and acute abdomen of 08/20/2013 and abdomen film  of 08/23/2013    FINDINGS:  The lung apices are not included on the chest x-ray, and repeat film  can be obtained if warranted. For verification of the placement of  the NG tube, the film is sufficient showing the tip to be at the  expected gastroesophageal junction. Haziness at both lung bases is  consistent with atelectasis and small effusions right larger than  left. Cardiomegaly is stable and and AICD lead remains.    Supine and erect views of the abdomen show no bowel obstruction. No  free air is seen. A moderate amount of feces is noted throughout the  colon. Surgical clips are present in the right upper quadrant from  prior cholecystectomy. The pubic symphysis is slightly widened but  stable comparedto prior KUB. Penile prosthesis is noted.     IMPRESSION:  1. NG tube tip at the expected gastroesophageal junction.  2. Probable small pleural effusions and basilar atelectasis right  greater than left. The lung apices are not included on the present  film.  3. No bowel obstruction or free air. Moderate amount of feces  throughout the colon.      Electronically Signed    By: Ivar Drape M.D.    On: 08/25/2013 16:40         Verified By: Joretta Bachelor, M.D.,   Assessment/Plan:  Assessment/Plan:  Assessment Chronic nausea. Mild gastritis. Already had neurologic w/u. Possibly nauea related to constipation?   Plan Long discussion with family. Previous attempt at colonoscopy failed. Pt not able to tolerate BE in the past; mainly unable to defacate barium once in. Remove NG. Start clears.  Order GET tomorrow. Make sure patient is on daily laxatives. Continue valium or ativan. If hiccups continue, could also try thorazine which may help with nausea also. Could arrange virtual colonoscopy in [place] as outpt. Discussed case with Dr. Vira Agar, who is his GI doctor.   Electronic Signatures: Verdie Shire (MD)  (Signed 20-May-15 13:38)  Authored: Chief Complaint, VITAL SIGNS/ANCILLARY NOTES, Brief Assessment, Lab Results, Radiology Results, Assessment/Plan   Last Updated: 20-May-15 13:38 by Verdie Shire (MD)

## 2014-07-31 NOTE — Consult Note (Signed)
CT of chest showing retro flow of blood of vena cava, this showing signif right heart failure.  Will get cardiology consult, spoke to Dr Sophronia Simas. Pt sleepy today, less vomiting but a lot of hiccups.  Low dose thorazine may help with this but will try xanax first.  Exam of chest shows no rales.  Check liver panel in morning.  Electronic Signatures: Manya Silvas (MD)  (Signed on 21-May-15 17:28)  Authored  Last Updated: 21-May-15 17:28 by Manya Silvas (MD)

## 2014-07-31 NOTE — Consult Note (Signed)
Persistent nausea. EGD showed mild gastritis mainly in fundus. Small hiatal hernia also seen. Gastric bx taken. Doubt this finding accounts for his persistent nausea. Question if nausea may be psychological. Nevertheless, continue carafate daily since patient cannot tolerate PPI's. Give laxatives to help with constipation. Coumadin can be resumed tomorrow. If nausea persists, consider gastric emptying scan as well. Ideally, patient should be off reglan prior to scheduling GET scan. Since reglan not helping, will stop reglan so gastric emptying scan can be ordered in few days. Also, will add miralax daily to help with constipation. Will also decrease IVF to decrease arm/feet swelling.Thanks.   Electronic Signatures: Verdie Shire (MD) (Signed on 18-May-15 15:20)  Authored   Last Updated: 18-May-15 16:46 by Verdie Shire (MD)

## 2014-07-31 NOTE — Consult Note (Signed)
Pt back from radiology. Still with hiccups. Some nausea though able to keep down clears so far today.  GET showed delayed gastric emptying. Pt back on reglan. Chest CT showed no evidence of cancer. Some CHF. Sinus CT showed no active infection. Will see how he does rest of today. If stable, may be able to advance diet. Dr. Vira Agar to check on patient tomorrow. Thanks.  Electronic Signatures: Verdie Shire (MD)  (Signed on 21-May-15 14:42)  Authored  Last Updated: 21-May-15 14:42 by Verdie Shire (MD)

## 2014-07-31 NOTE — Consult Note (Signed)
Pt with hiccups, intractable.  Spitting a  lot of saliva out , vomiting clear fluid from stomach also.  Will increase Reglan to 5mg  iv q6 hours. There is potential interaction with Reglan and Thorazine of extrapyramidal side effects.  Given his severe hiccups wonder about phrenic nerve neuritis of viral origin.  Wonder if steroids might be beneficial for nerve inflammation and if no improvement in 2 days would consider empiric steroids for a few days. Discussed with family that we don't know why he has this symptom complex and am at loss for a singular cause for his symptoms.  therefore we are treating his symptoms for now with his current meds.  Electronic Signatures: Manya Silvas (MD)  (Signed on 22-May-15 16:43)  Authored  Last Updated: 22-May-15 16:43 by Manya Silvas (MD)

## 2014-07-31 NOTE — Consult Note (Signed)
Details:   - Still having hiccups and n/v.  started solumedrol 30 q8 but not helping yet.   The thorazine was d/c'd for unclear reasons so will try adding that back Will also try baclofen and continue the solumedrol for a bit longer.   Difficult  case.   Could consider esophageal manometry althoug would likely be low yield.   Electronic Signatures: Arther Dames (MD)  (Signed [phone] 16:45)  Authored: Details   Last Updated: 24-May-15 16:45 by Arther Dames (MD)

## 2014-07-31 NOTE — Consult Note (Signed)
Pt with intractable nausea/vomiting. Please see [facility] notes. Does need EGD scheduled but not until coumadin held and INR < 1.5 or so. Will follow. Thanks  Electronic Signatures: Verdie Shire (MD)  (Signed on 15-May-15 14:17)  Authored  Last Updated: 15-May-15 14:17 by Verdie Shire (MD)

## 2014-07-31 NOTE — Consult Note (Signed)
Pt seen yesterday at request of family.  I know patient frrom previous office and procedures.  He was given gatorade to drink by me and with in 5 minutes vomited 1/3 of it.  He has recent chest CT(2 months ago)) supicious for malignancy.  Complains of chest pain and forehead pain and sinus problems. No hematemesis, did have constipation. better now.  He has a gastric empty study scheduled, I  will repeat CT of chest and get Ct of sinuses and get ENT consult with Dr. Nadeen Landau who has seen him before.  May need pulmonary consult if things of interest show up on CT.  Electronic Signatures: Manya Silvas (MD)  (Signed on 21-May-15 10:23)  Authored  Last Updated: 21-May-15 10:23 by Manya Silvas (MD)

## 2014-07-31 NOTE — Consult Note (Signed)
PATIENT NAME:  [name], [name] MR#:  [id number] DATE OF BIRTH:  1932/03/13  DATE OF CONSULTATION:  08/27/2013  REQUESTING PHYSICIAN:  Dr. Gaylyn Cheers CONSULTING PHYSICIAN:  Janalee Dane, MD  REASON FOR CONSULTATION: Chronic sinusitis and protracted nausea.  HISTORY OF PRESENT ILLNESS: The patient is a 81 year old gentleman who is well known to me and in whom I have previously done endoscopic sinus surgery. He has had approximately 2 weeks of nausea and vomiting and was admitted to the hospital for this on 05/14. The patient has undergone upper endoscopy which was negative. He had a CT scan of the chest today, which was improved compared to his previous chest CT but still with right lower lobe atelectasis. He also had a CT scan of the sinuses today which showed post surgical changes and no significant air fluid level or sinusitis. He has nearly constant hiccups; although during my discussion with the family, he was initially hiccuping but vomited some mucus and afterward stopped hiccuping.  He has had a previous colonoscopy, which the patient's family describes as "difficult because of his anatomy."    ALLERGIES:  ERYTHROMYCIN, FAMOTIDINE, OMEPRAZOLE, PROTONIX.  HOME MEDICATIONS:  Advair, Coreg, Valium, enalapril, Flonase, Lasix, loratadine, Singulair, Zofran.   PAST SURGICAL HISTORY:  1. Sinus surgery.  2. Cholecystectomy (Dr. Duffy Rhody).   PHYSICAL EXAMINATION:  GENERAL: Mildly disheveled but answers questions appropriately and very cooperative.  NECK: There are no palpable masses or lesions.  NOSE: The nares are patent. Septum is midline.  ORAL CAVITY AND OROPHARYNX: No masses or lesions.   PROCEDURE: Flexible fiber-optic nasal endoscopy. The nose was topically anesthetized with topical phenylephrine lidocaine soaked on cotton balls. This was removed. The 5 mm flexible scope was used to perform sinonasal endoscopy. There are postsurgical changes noted bilaterally with no  polyps or purulence. There is a small sessile polyp at the floor of the right maxillary sinus medially. Postsurgical changes are all well-healed with no exposed bone. Nasopharynx is without masses or lesions. The larynx is also without masses or lesions. The vocal cords abduct and adduct normally.   IMPRESSION: Chronic pansinusitis with no acute exacerbation. The patient's sinus disease is not contributing to his nausea and vomiting. I reassured the patient's family that the paranasal sinuses produce approximately a liter of mucus a day and that mucus is being affected by the same process that is causing his nausea and vomiting and not the cause of his nausea and vomiting.   ____________________________ Lenna Sciara. Nadeen Landau, MD jmc:dd D: 08/27/2013 20:27:58 ET T: 08/27/2013 21:13:18 ET JOB#: [id number]  cc: Janalee Dane, MD, <Dictator> Sharon MD ELECTRONICALLY SIGNED 09/06/2013 17:36

## 2014-07-31 NOTE — Consult Note (Signed)
Pt just left for radiology. Family left too. Vomited gatorade yest. For gastric emptying scan, CT of chest/sinuses today. ENT evaluation also. Last CT of chest done on 4/3. Ativan started by Dr. Vira Agar. Pulm consult if CT of chest abnormal. I will be out tomorrow. Dr. Vira Agar will see patient tomorrow in my absence. thanks  Electronic Signatures: Verdie Shire (MD)  (Signed on 21-May-15 11:30)  Authored  Last Updated: 21-May-15 11:30 by Verdie Shire (MD)

## 2014-07-31 NOTE — Consult Note (Signed)
Pt hiccups have gone away after starting the prednisone.  It could possibly be due to other meds which have finally kicked in or it could be the original illness has played itself out.   Since he appears to have responded to the current regimen and the prednisone I will give him a prescription for a  4 week taper of prednisone.  He is instructed to see me in a week or to call and check in if he is doing great.  Electronic Signatures: Manya Silvas (MD)  (Signed on 26-May-15 12:08)  Authored  Last Updated: 26-May-15 12:08 by Manya Silvas (MD)

## 2014-07-31 NOTE — Discharge Summary (Signed)
PATIENT NAME:  [name], [name] MR#:  [id number] DATE OF BIRTH:  [birth year]  DATE OF ADMISSION:  08/22/2013 DATE OF DISCHARGE:  09/01/2013  PRIMARY CARE PHYSICIAN: Dr. Danise Mina.    DISCHARGE DIAGNOSES: 1.  Intractable nausea, vomiting and hiccups.  2.  Chronic atrial fibrillation.  3.  Chronic systolic congestive heart failure with ejection fraction 20%.  4.  Chronic obstructive pulmonary disease.  5.  Moderate to severe protein-calorie malnutrition.  6.  Anxiety.   CONDITION: Stable.   CODE STATUS: Full code.   HOME MEDICATIONS: Please refer the medication reconciliation list.   DIET: Low-sodium, low-fat, low-cholesterol diet.   ACTIVITY: As tolerated.   FOLLOWUP CARE:  Follow up with PCP within 1 to 2 weeks. Followup, with Dr. Tiffany Kocher within 1 week.   HOSPITAL COURSE:  This is an addendum.  For detailed discharge summary, please refer to the interim discharge summary dictated by Dr. Vianne Bulls yesterday. The patient's intractable  nausea, vomiting and hiccups improved for the past 2 days after treatment with the baclofen and Solu-Medrol.  The patient was on full liquid diet and had 2 grams sodium diet. The patient tolerated diet well. No complaints. The patient wants to go home today. I discussed with Dr. Tiffany Kocher.  He agreed to discharge the patient today. The patient is clinically stable and will be discharged to home today. I discussed the patient's discharge plan with the patient, the patient's wife and daughter, nurse, case manager and Dr. Tiffany Kocher.   TIME SPENT: About 38 minutes.   ____________________________ Demetrios Loll, MD qc:cs D: 09/01/2013 16:11:00 ET T: 09/01/2013 19:34:43 ET JOB#: [id number]  cc: Demetrios Loll, MD, <Dictator> Demetrios Loll MD ELECTRONICALLY SIGNED 09/02/2013 17:18

## 2014-07-31 NOTE — Consult Note (Signed)
Chief Complaint:  Subjective/Chief Complaint Still feel anxious. Nausea persists. No vomiting since yest AM. Did not get valium yet. Protime remains high despite holding coumadin.   VITAL SIGNS/ANCILLARY NOTES: **Vital Signs.:   17-May-15 09:11  Pulse Pulse 74  Systolic BP Systolic BP 300  Diastolic BP (mmHg) Diastolic BP (mmHg) 65  Mean BP 80   Brief Assessment:  GEN no acute distress   Cardiac Regular   Respiratory clear BS   Gastrointestinal Normal   Lab Results: Routine Coag:  17-May-15 05:15   Prothrombin  29.5  INR 2.9 (INR reference interval applies to patients on anticoagulant therapy. A single INR therapeutic range for coumarins is not optimal for all indications; however, the suggested range for most indications is 2.0 - 3.0. Exceptions to the INR Reference Range may include: Prosthetic heart valves, acute myocardial infarction, prevention of myocardial infarction, and combinations of aspirin and anticoagulant. The need for a higher or lower target INR must be assessed individually. Reference: The Pharmacology and Management of the Vitamin K  antagonists: the seventh ACCP Conference on Antithrombotic and Thrombolytic Therapy. PQZRA.0762 Sept:126 (3suppl): [id number]. A HCT value >55% may artifactually increase the PT.  In one study,  the increase was an average of 25%. Reference:  "Effect on Routine and Special Coagulation Testing Values of Citrate Anticoagulant Adjustment in Patients with High HCT Values." American Journal of Clinical Pathology 2006;126:400-405.)   Assessment/Plan:  Assessment/Plan:  Assessment Persistent nausea.   Plan Agree with KUB. Agree with Vit K. Recheck INR in AM. If still elevated, may give FFP. Will try to schedule EGD tomorrow afternoon. Pt quite anxious to get EGD done but INR has to be lower. Order vaium prn. thanks   Electronic Signatures: Verdie Shire (MD)  (Signed 17-May-15 11:20)  Authored: Chief Complaint, VITAL  SIGNS/ANCILLARY NOTES, Brief Assessment, Lab Results, Assessment/Plan   Last Updated: 17-May-15 11:20 by Verdie Shire (MD)

## 2014-07-31 NOTE — Consult Note (Signed)
PATIENT NAME:  [name], [name] MR#:  [id number] DATE OF BIRTH:  [birth year]  DATE OF CONSULTATION:  08/21/2013  REFERRING PHYSICIAN: Dr. Verdell Carmine  CONSULTING PHYSICIAN:  Verdie Shire, MD and Corky Sox. Rolondo Pierre, PA-C  REASON FOR CONSULTATION: Nausea and vomiting.  HISTORY OF PRESENT ILLNESS: This is a pleasant 81 year old gentleman, who was initially admitted yesterday, May 14 with concerns of intractable nausea and vomiting. This has been going on for about 5 to 6 days and anytime he tries to eat something and comes right back up within just a few minutes. He does not feel that he has been able to tolerate more than a few bites over this period of time. He has not seen any right red blood in his vomit, but he does describe it as appearing like coffee grounds. Bowel habits have been regular up until this morning when he had a black-colored stool. It does appear that this was heme tested and was negative. This morning, he had a few sips of broth and started to feel nauseated, so he completely stopped and he was placed n.p.o. status. No actual vomiting yet today. This is a patient established with Dr. Gaylyn Cheers of gastroenterology and has undergone multiple EGDs to evaluate a history of GERD and gastritis. Most recently, his EGD was in 2010 where he had LA grade C esophagitis. Unfortunately, this patient cannot tolerate PPIs or H2-blockers and previously he has done well with Mylanta, Maalox and Carafate. Currently, during this hospitalization, he has been given antiemetics but unfortunately he is still feeling quite nauseated, These are p.r.n. and not currently scheduled. There is no abdominal pain. There have been no fever or chills. There is no chest pain or shortness of breath. A CT scan was obtained on the patient showing mild to moderate amount of retained stool without obstruction. No other acute abnormality. Abdominal x-ray revealed similar findings with retained stool, but no clear evidence of  obstruction. Liver enzymes are unremarkable and white blood cells are normal. A hemoglobin today of 12.1. INR is 2.8 and the patient does take warfarin for A. fib. The patient is reporting ongoing fatigue. No other symptomatic concerns or complaints.   PAST MEDICAL HISTORY: Atrial fibrillation, currently on warfarin, history of cardiomyopathy, status post pacemaker placement with defibrillator, COPD and hypertension.   PAST SURGICAL HISTORY: Pacemaker/defibrillator placement.   ALLERGIES: FAMOTIDINE, OMEPRAZOLE, ERYTHROMYCIN, ALL PPIS AND H2-BLOCKERS.   FAMILY HISTORY: The patient denies any GI malignancy, colon polyps or IBD in the family. Father passed away from a bowel obstruction that became complicated.   SOCIAL HISTORY: The patient denies any current alcohol, tobacco or illicit drug use. He is married.   HOME MEDICATIONS: Advair, Coreg, Valium, enalapril, Flonase, Lasix, loratadine, Singulair, Zofran and warfarin.   REVIEW OF SYSTEMS: A 10 system review of systems was obtained on the patient. Pertinent positives are mentioned above and otherwise negative.   OBJECTIVE:  VITAL SIGNS: Blood pressure 93/66, heart rate 60, respirations 20, temperature 98.2, bedside pulse oximetry 96%.  GENERAL: This is a pleasant 81 year old gentleman resting quietly and comfortably in bed, accompanied by a few family members, in no acute distress. Alert and oriented x 3.  HEAD: Atraumatic, normocephalic.  NECK: Supple. No lymphadenopathy noted.  HEENT: Sclerae anicteric. Mucous membranes moist.  LUNGS: Respirations are even and unlabored. Clear to auscultation bilateral anterior lung fields.  HEART: Regular rate and rhythm. S1, S2 noted.  ABDOMEN: Soft, nontender, nondistended. Normoactive bowel sounds noted in all 4 quadrants. No masses, hernias  or organomegaly appreciated. No signs of an acute abdomen.  RECTAL: Deferred.  PSYCHIATRIC: Appropriate mood and affect.  NEUROLOGIC: Cranial nerves II through  XII are grossly intact.  EXTREMITIES: Negative for lower extremity edema. 2+ pulses noted in bilateral upper extremities.   LABORATORY DATA: White blood cells 5, hemoglobin 12.1, hematocrit 35.1, platelets 117, MCV 84, heme negative. Sodium 137, potassium 4, BUN 12, creatinine 0.99, glucose 87, bilirubin 1.3, alkaline phosphatase 62, ALT 21, AST 22. Lipase 182. INR 2.8. PT 28.8.   IMAGING: A CT scan of the abdomen and pelvis was obtained on the patient showing mild to moderate retained large bowel stool without obstruction, sigmoid diverticulosis, but no diverticulitis, status post cholecystectomy, no other acute abnormality. Abdominal x-ray was obtained on the patient showing retained stool that was mild without any evidence of obstruction.   ASSESSMENT:  1.  Intractable nausea and vomiting with possible coffee-ground appearance.  2.  Black stool this morning concerning for possible melena. The patient was heme tested yesterday and was negative.  3.  History of gastritis and LA grade C esophagitis, most recent esophagogastroduodenoscopy in 2010.  4.  Atrial fibrillation, currently on warfarin with an INR of 2.8.   PLAN: I have discussed this patient's case in detail with Dr. Verdie Shire, who is involved in the development of the patient's plan of care. At this time, because he is describing his emesis as appearing like coffee grounds, we do recommend keeping a close eye on his hemoglobin and being prepared to monitor the trend and transfuse him if necessary. Unfortunately, this patient cannot tolerate any PPI or H2-blocker, but per medical records, he can tolerate Carafate, Maalox and Mylanta. Therefore, we will start him on Carafate 4 times a day. He likely can benefit from an upper EGD but, unfortunately, with an INR of 2.8, would not be clinically feasible at the current moment. Therefore, we will place his warfarin on hold for the time being and continue to monitor the patient. We will continue to  follow throughout hospitalization and make further recommendations pending above and per clinical course.   Thank you so much for this consultation and for allowing [place] to participate in the patient's plan of care.   ____________________________ Corky Sox. Rubi Tooley, PA-C kme:aw D: 08/21/2013 13:08:11 ET T: 08/21/2013 13:17:25 ET JOB#: [id number]  cc: Corky Sox. Pasha Gadison, PA-C, <Dictator> [place] PA ELECTRONICALLY SIGNED 08/21/2013 13:47

## 2014-08-04 ENCOUNTER — Ambulatory Visit (INDEPENDENT_AMBULATORY_CARE_PROVIDER_SITE_OTHER): Payer: Medicare Other | Admitting: *Deleted

## 2014-08-04 DIAGNOSIS — I4892 Unspecified atrial flutter: Secondary | ICD-10-CM

## 2014-08-04 DIAGNOSIS — Z7901 Long term (current) use of anticoagulants: Secondary | ICD-10-CM

## 2014-08-04 LAB — POCT INR: INR: 2.8

## 2014-08-11 ENCOUNTER — Ambulatory Visit (INDEPENDENT_AMBULATORY_CARE_PROVIDER_SITE_OTHER): Payer: Medicare Other

## 2014-08-11 ENCOUNTER — Other Ambulatory Visit: Payer: Self-pay | Admitting: Cardiovascular Disease

## 2014-08-11 ENCOUNTER — Other Ambulatory Visit (INDEPENDENT_AMBULATORY_CARE_PROVIDER_SITE_OTHER): Payer: Medicare Other | Admitting: *Deleted

## 2014-08-11 DIAGNOSIS — I4892 Unspecified atrial flutter: Secondary | ICD-10-CM

## 2014-08-11 DIAGNOSIS — Z7901 Long term (current) use of anticoagulants: Secondary | ICD-10-CM | POA: Diagnosis not present

## 2014-08-11 LAB — INR (THROMBOREL-S): INR (Thromborel-S): 5.7 — ABNORMAL HIGH (ref 0.8–1.2)

## 2014-08-11 LAB — PT (THROMBOREL-S): PT (Thromborel-S): 58.4 s — ABNORMAL HIGH (ref 10.7–13.1)

## 2014-08-11 LAB — PROTIME-INR: INR: 5.7 — AB (ref <=1.1)

## 2014-08-11 LAB — POCT INR: INR: 7.5

## 2014-08-18 ENCOUNTER — Ambulatory Visit (INDEPENDENT_AMBULATORY_CARE_PROVIDER_SITE_OTHER): Payer: Medicare Other

## 2014-08-18 DIAGNOSIS — I4892 Unspecified atrial flutter: Secondary | ICD-10-CM | POA: Diagnosis not present

## 2014-08-18 DIAGNOSIS — Z7901 Long term (current) use of anticoagulants: Secondary | ICD-10-CM | POA: Diagnosis not present

## 2014-08-18 LAB — POCT INR: INR: 1.9

## 2014-08-23 ENCOUNTER — Ambulatory Visit (INDEPENDENT_AMBULATORY_CARE_PROVIDER_SITE_OTHER): Payer: Medicare Other

## 2014-08-23 DIAGNOSIS — I4892 Unspecified atrial flutter: Secondary | ICD-10-CM

## 2014-08-23 DIAGNOSIS — Z7901 Long term (current) use of anticoagulants: Secondary | ICD-10-CM | POA: Diagnosis not present

## 2014-08-23 LAB — POCT INR: INR: 1.6

## 2014-08-25 DIAGNOSIS — J310 Chronic rhinitis: Secondary | ICD-10-CM | POA: Diagnosis not present

## 2014-08-27 ENCOUNTER — Encounter: Payer: Self-pay | Admitting: Family Medicine

## 2014-08-27 ENCOUNTER — Ambulatory Visit (INDEPENDENT_AMBULATORY_CARE_PROVIDER_SITE_OTHER): Payer: Medicare Other | Admitting: Family Medicine

## 2014-08-27 ENCOUNTER — Telehealth: Payer: Self-pay | Admitting: *Deleted

## 2014-08-27 VITALS — BP 112/60 | HR 68 | Temp 98.0°F | Wt 143.0 lb

## 2014-08-27 DIAGNOSIS — E538 Deficiency of other specified B group vitamins: Secondary | ICD-10-CM

## 2014-08-27 DIAGNOSIS — J209 Acute bronchitis, unspecified: Secondary | ICD-10-CM | POA: Diagnosis not present

## 2014-08-27 DIAGNOSIS — Z7901 Long term (current) use of anticoagulants: Secondary | ICD-10-CM | POA: Diagnosis not present

## 2014-08-27 MED ORDER — AZITHROMYCIN 250 MG PO TABS: ORAL_TABLET | ORAL | Status: DC

## 2014-08-27 MED ORDER — CYANOCOBALAMIN 1000 MCG/ML IJ SOLN
1000.0000 ug | Freq: Once | INTRAMUSCULAR | Status: AC
  Administered 2014-08-27: 1000 ug via INTRAMUSCULAR

## 2014-08-27 NOTE — Assessment & Plan Note (Addendum)
Given comorbidities, will treat aggressively with zpack. Further supportive care provided - rec fluids, mucinex.  Update if not improving with treatment. rec continued nasal steroid. Sinuses seem clear today.

## 2014-08-27 NOTE — Assessment & Plan Note (Signed)
B12 shot today. 

## 2014-08-27 NOTE — Patient Instructions (Addendum)
B12 shot today. I think you may be developing COPD exacerbation.  Treat with zpack. Push fluids and rest. Continue nasal steroid (nasarel). May use plain mucinex with plenty of water to break up the mucous. Let us know if fever >101 or worsening productive cough or shortness of breath.

## 2014-08-27 NOTE — Progress Notes (Signed)
BP 112/60 mmHg  Pulse 68  Temp(Src) 98 F (36.7 C) (Oral)  Wt 143 lb (64.864 kg)  SpO2 94%   CC: cough, nasal congestion  Subjective:    Patient ID: [name], male    DOB: 03/15/1932, 81 y.o.   MRN: [id number]  HPI: [name] is a 81 y.o. male presenting on 08/27/2014 for Cough and Nasal Congestion   "I've got distemper" 4-5 day history of increased nasal congestion, PNdrainage, coughing productive of yellow sputum, ST. Wheezing when he's breathing, mild dyspnea. Some gen weakness felt today. Some chest tightness with cough.  No fevers/chills, nausea, ear or tooth pain.  Known chronic sinusitis. Saw ENT 3-4 wks ago treated with doxycycline and prednisone which caused rash. Returned 2d ago and told not sinus infection.   Endorses B12 shot is due today. Known COPD and asthma. Chronic systolic CHF.   Relevant past medical, surgical, family and social history reviewed and updated as indicated. Interim medical history since our last visit reviewed. Allergies and medications reviewed and updated. Current Outpatient Prescriptions on File Prior to Visit  Medication Sig  . acetaminophen (TYLENOL) 500 MG tablet Take 250 mg by mouth daily as needed. Pain  . ALPRAZolam (XANAX) 0.25 MG tablet Take 0.25 mg by mouth 3 (three) times daily as needed for anxiety.  . carvedilol (COREG) 6.25 MG tablet Take 1 tablet (6.25 mg total) by mouth 2 (two) times daily with a meal.  . cyanocobalamin (,VITAMIN B-12,) 1000 MCG/ML injection Inject 1 mL (1,000 mcg total) into the muscle every 30 (thirty) days. Started 03/2014  . docusate sodium (COLACE) 100 MG capsule Take 1 capsule (100 mg total) by mouth daily. (Patient taking differently: Take 100 mg by mouth daily as needed for mild constipation. )  . enalapril (VASOTEC) 5 MG tablet Take 1 tablet (5 mg total) by mouth 2 (two) times daily.  . flunisolide (NASAREL) 29 MCG/ACT (0.025%) nasal spray 2 sprays by Nasal route daily.  Dose is for each nostril.   . Fluticasone-Salmeterol (ADVAIR DISKUS) 100-50 MCG/DOSE AEPB Inhale 1 puff into the lungs daily as needed (shortness of breath or wheezing).   . furosemide (LASIX) 20 MG tablet Take 0.5 tablets (10 mg total) by mouth daily.  Marland Kitchen HYDROcodone-acetaminophen (NORCO/VICODIN) 5-325 MG per tablet Take by mouth.  . Loratadine 10 MG CAPS Take 1 capsule by mouth daily as needed (allergies).   . montelukast (SINGULAIR) 10 MG tablet Take 10 mg by mouth daily as needed.    . sucralfate (CARAFATE) 1 G tablet TAKE 1 TABLET BY MOUTH AS NEEDED for stomach issues  . warfarin (COUMADIN) 5 MG tablet Take as directed by the anticoagulation clinic   No current facility-administered medications on file prior to visit.    Review of Systems Per HPI unless specifically indicated above     Objective:    BP 112/60 mmHg  Pulse 68  Temp(Src) 98 F (36.7 C) (Oral)  Wt 143 lb (64.864 kg)  SpO2 94%  Wt Readings from Last 3 Encounters:  08/27/14 143 lb (64.864 kg)  05/12/14 142 lb 4.8 oz (64.547 kg)  05/07/14 143 lb 12 oz (65.205 kg)    Physical Exam  Constitutional: He appears well-developed and well-nourished. No distress.  HENT:  Head: Normocephalic and atraumatic.  Right Ear: Hearing, tympanic membrane, external ear and ear canal normal.  Left Ear: Hearing, tympanic membrane, external ear and ear canal normal.  Nose: Mucosal edema (on right) present. No rhinorrhea. Right sinus exhibits no  maxillary sinus tenderness and no frontal sinus tenderness. Left sinus exhibits no maxillary sinus tenderness and no frontal sinus tenderness.  Mouth/Throat: Uvula is midline, oropharynx is clear and moist and mucous membranes are normal. No oropharyngeal exudate, posterior oropharyngeal edema, posterior oropharyngeal erythema or tonsillar abscesses.  Eyes: Conjunctivae and EOM are normal. Pupils are equal, round, and reactive to light. No scleral icterus.  Neck: Normal range of motion. Neck  supple.  Cardiovascular: Normal rate, regular rhythm, normal heart sounds and intact distal pulses.   No murmur heard. Pulmonary/Chest: Effort normal. No respiratory distress. He has wheezes (mild expiratory). He has no rales.  coarse  Lymphadenopathy:    He has no cervical adenopathy.  Skin: Skin is warm and dry. No rash noted.  Nursing note and vitals reviewed.  Results for orders placed or performed in visit on 08/23/14  POCT INR  Result Value Ref Range   INR 1.6       Assessment & Plan:   Problem List Items Addressed This Visit    Acute bronchitis - Primary    Given comorbidities, will treat aggressively with zpack. Further supportive care provided - rec fluids, mucinex.  Update if not improving with treatment. rec continued nasal steroid. Sinuses seem clear today.      B12 deficiency    B12 shot today.      Relevant Medications   cyanocobalamin ((VITAMIN B-12)) injection 1,000 mcg (Completed)   Warfarin anticoagulation    Asked to call coumadin clinic to titrate comadin dosing while on zpack.         Follow up plan: Return if symptoms worsen or fail to improve.

## 2014-08-27 NOTE — Progress Notes (Signed)
Pre visit review using our clinic review tool, if applicable. No additional management support is needed unless otherwise documented below in the visit note. 

## 2014-08-27 NOTE — Telephone Encounter (Signed)
Patient called and stated that he is now on a zpak and would like to know if he needs to adjust his coumadin dose while taking this. He said that if you could not reach him, feel free to leave a vm. Thanks, MI

## 2014-08-27 NOTE — Telephone Encounter (Signed)
Returned call to pt, advised pt Zpak can increase effects of Coumadin slightly but because it is such a short course it usually has minimal effect on INR.  Suggested pt increase vit K intake while on abx.  Pt verbalized understanding. Keep scheduled f/u appt.

## 2014-08-27 NOTE — Assessment & Plan Note (Signed)
Asked to call coumadin clinic to titrate comadin dosing while on zpack.

## 2014-08-31 ENCOUNTER — Telehealth: Payer: Self-pay

## 2014-08-31 MED ORDER — LEVALBUTEROL TARTRATE 45 MCG/ACT IN AERO: 1.0000 | INHALATION_SPRAY | Freq: Four times a day (QID) | RESPIRATORY_TRACT | Status: DC | PRN

## 2014-08-31 NOTE — Telephone Encounter (Signed)
Pt was seen 08/27/14 and finishing abx today; pt said still has prod cough with dark yellow phlegm; pt rattles in chest at night;no more SOB than normally has;no fever.pt taking mucinex but request stronger abx to [facility]. Pt request cb.

## 2014-08-31 NOTE — Telephone Encounter (Signed)
xopenex inhaler sent in 2/2 aflutter history - watch for tachycardia with inhaler.

## 2014-08-31 NOTE — Telephone Encounter (Signed)
Spoke to Mrs. Dengel and advised of message below.  She states patient does not have an albuterol inhaler at this time but would like to have one [facility] to [facility] [address].  She will continue to monitor his symptoms and call back if dyspnea or fever occur.

## 2014-08-31 NOTE — Telephone Encounter (Signed)
Mrs. Bordon notified.

## 2014-08-31 NOTE — Telephone Encounter (Signed)
Azithromycin is a long acting antibiotic - should stay in his system for 1-2 weeks. Recommend give this more time. Cough can be expected to last up to a few weeks prior to fully going away. Watch for worsening dyspnea or any fever Does he have albuterol inhaler for when he starts wheezing?

## 2014-09-02 ENCOUNTER — Telehealth: Payer: Self-pay | Admitting: Cardiology

## 2014-09-02 ENCOUNTER — Encounter: Payer: Self-pay | Admitting: Internal Medicine

## 2014-09-02 ENCOUNTER — Ambulatory Visit (INDEPENDENT_AMBULATORY_CARE_PROVIDER_SITE_OTHER): Payer: Medicare Other | Admitting: *Deleted

## 2014-09-02 ENCOUNTER — Telehealth: Payer: Self-pay | Admitting: *Deleted

## 2014-09-02 DIAGNOSIS — I5022 Chronic systolic (congestive) heart failure: Secondary | ICD-10-CM | POA: Diagnosis not present

## 2014-09-02 DIAGNOSIS — Z95810 Presence of automatic (implantable) cardiac defibrillator: Secondary | ICD-10-CM

## 2014-09-02 NOTE — Telephone Encounter (Signed)
Xopenex not on formulary. Will need a substitution. (Proair, Proventil or Ventolin)

## 2014-09-02 NOTE — Telephone Encounter (Signed)
Spoke with pt and reminded pt of remote transmission that is due today. Pt verbalized understanding.   

## 2014-09-03 LAB — CUP PACEART REMOTE DEVICE CHECK
Battery Voltage: 2.67 V
Brady Statistic AP VP Percent: 0 %
Brady Statistic AP VS Percent: 0 %
Brady Statistic AS VP Percent: 90.71 %
Brady Statistic AS VS Percent: 9.29 %
Brady Statistic RA Percent Paced: 0 %
Brady Statistic RV Percent Paced: 90.71 %
Date Time Interrogation Session: 20160527005600
HighPow Impedance: 361 Ohm
HighPow Impedance: 41 Ohm
HighPow Impedance: 52 Ohm
Lead Channel Impedance Value: 200 Ohm
Lead Channel Impedance Value: 285 Ohm
Lead Channel Impedance Value: 361 Ohm
Lead Channel Impedance Value: 437 Ohm
Lead Channel Impedance Value: 494 Ohm
Lead Channel Pacing Threshold Amplitude: 0.5 V
Lead Channel Pacing Threshold Amplitude: 0.875 V
Lead Channel Pacing Threshold Amplitude: 1 V
Lead Channel Pacing Threshold Pulse Width: 0.4 ms
Lead Channel Pacing Threshold Pulse Width: 0.4 ms
Lead Channel Pacing Threshold Pulse Width: 0.4 ms
Lead Channel Sensing Intrinsic Amplitude: 14.75 mV
Lead Channel Sensing Intrinsic Amplitude: 4.375 mV
Lead Channel Setting Pacing Amplitude: 2 V
Lead Channel Setting Pacing Amplitude: 2.5 V
Lead Channel Setting Pacing Pulse Width: 0.4 ms
Lead Channel Setting Pacing Pulse Width: 0.4 ms
Lead Channel Setting Sensing Sensitivity: 0.45 mV
Zone Setting Detection Interval: 300 ms
Zone Setting Detection Interval: 340 ms
Zone Setting Detection Interval: 350 ms
Zone Setting Detection Interval: 450 ms

## 2014-09-03 NOTE — Progress Notes (Signed)
Remote ICD transmission.   

## 2014-09-07 NOTE — Telephone Encounter (Signed)
plz fill out PA for xopenex.

## 2014-09-08 ENCOUNTER — Encounter: Payer: Self-pay | Admitting: Cardiology

## 2014-09-08 ENCOUNTER — Ambulatory Visit (INDEPENDENT_AMBULATORY_CARE_PROVIDER_SITE_OTHER): Payer: Medicare Other

## 2014-09-08 DIAGNOSIS — Z7901 Long term (current) use of anticoagulants: Secondary | ICD-10-CM

## 2014-09-08 DIAGNOSIS — I4892 Unspecified atrial flutter: Secondary | ICD-10-CM

## 2014-09-08 LAB — POCT INR: INR: 2.5

## 2014-09-08 NOTE — Telephone Encounter (Deleted)
In your IN box for completion.  

## 2014-09-10 NOTE — Telephone Encounter (Signed)
Form faxed. Will await determination. 

## 2014-09-10 NOTE — Telephone Encounter (Signed)
Filled and in Kim's box. 

## 2014-09-10 NOTE — Telephone Encounter (Signed)
In your IN box for completion. (I had a difficult time finding where to send PA)

## 2014-09-22 ENCOUNTER — Ambulatory Visit
Admission: RE | Admit: 2014-09-22 | Discharge: 2014-09-22 | Disposition: A | Discharge status: Home / Self Care | Payer: Medicare Other | Source: Ambulatory Visit | Attending: Internal Medicine | Admitting: Internal Medicine

## 2014-09-22 ENCOUNTER — Other Ambulatory Visit: Payer: Self-pay | Admitting: Internal Medicine

## 2014-09-22 DIAGNOSIS — I5022 Chronic systolic (congestive) heart failure: Secondary | ICD-10-CM | POA: Insufficient documentation

## 2014-09-22 DIAGNOSIS — I351 Nonrheumatic aortic (valve) insufficiency: Secondary | ICD-10-CM | POA: Diagnosis not present

## 2014-09-22 NOTE — Progress Notes (Signed)
*  PRELIMINARY RESULTS* Echocardiogram 2D Echocardiogram has been performed.  [name] 09/22/2014, 12:11 PM

## 2014-09-29 ENCOUNTER — Ambulatory Visit (INDEPENDENT_AMBULATORY_CARE_PROVIDER_SITE_OTHER): Payer: Medicare Other

## 2014-09-29 DIAGNOSIS — Z7901 Long term (current) use of anticoagulants: Secondary | ICD-10-CM

## 2014-09-29 DIAGNOSIS — I4892 Unspecified atrial flutter: Secondary | ICD-10-CM

## 2014-09-29 LAB — POCT INR: INR: 1.4

## 2014-10-20 ENCOUNTER — Ambulatory Visit (INDEPENDENT_AMBULATORY_CARE_PROVIDER_SITE_OTHER): Payer: Medicare Other

## 2014-10-20 DIAGNOSIS — Z7901 Long term (current) use of anticoagulants: Secondary | ICD-10-CM | POA: Diagnosis not present

## 2014-10-20 DIAGNOSIS — I4892 Unspecified atrial flutter: Secondary | ICD-10-CM | POA: Diagnosis not present

## 2014-10-20 LAB — POCT INR: INR: 1.7

## 2014-11-10 ENCOUNTER — Ambulatory Visit (INDEPENDENT_AMBULATORY_CARE_PROVIDER_SITE_OTHER): Payer: Medicare Other | Admitting: *Deleted

## 2014-11-10 DIAGNOSIS — Z7901 Long term (current) use of anticoagulants: Secondary | ICD-10-CM | POA: Diagnosis not present

## 2014-11-10 DIAGNOSIS — I4892 Unspecified atrial flutter: Secondary | ICD-10-CM | POA: Diagnosis not present

## 2014-11-10 LAB — POCT INR: INR: 2.2

## 2014-12-02 ENCOUNTER — Ambulatory Visit (INDEPENDENT_AMBULATORY_CARE_PROVIDER_SITE_OTHER): Payer: Medicare Other | Admitting: *Deleted

## 2014-12-02 ENCOUNTER — Encounter: Payer: Self-pay | Admitting: Internal Medicine

## 2014-12-02 DIAGNOSIS — Z95810 Presence of automatic (implantable) cardiac defibrillator: Secondary | ICD-10-CM

## 2014-12-02 DIAGNOSIS — I5022 Chronic systolic (congestive) heart failure: Secondary | ICD-10-CM

## 2014-12-02 NOTE — Progress Notes (Signed)
Remote ICD transmission.   

## 2014-12-03 ENCOUNTER — Other Ambulatory Visit: Payer: Self-pay | Admitting: Cardiology

## 2014-12-03 NOTE — Telephone Encounter (Signed)
REFILL 

## 2014-12-06 ENCOUNTER — Telehealth: Payer: Self-pay | Admitting: *Deleted

## 2014-12-06 LAB — CUP PACEART REMOTE DEVICE CHECK
Battery Voltage: 2.64 V
Brady Statistic AP VP Percent: 0 %
Brady Statistic AP VS Percent: 0 %
Brady Statistic AS VP Percent: 94.42 %
Brady Statistic AS VS Percent: 5.58 %
Brady Statistic RA Percent Paced: 0 %
Brady Statistic RV Percent Paced: 94.42 %
Date Time Interrogation Session: 20160825133431
HighPow Impedance: 342 Ohm
HighPow Impedance: 38 Ohm
HighPow Impedance: 48 Ohm
Lead Channel Impedance Value: 200 Ohm
Lead Channel Impedance Value: 266 Ohm
Lead Channel Impedance Value: 342 Ohm
Lead Channel Impedance Value: 418 Ohm
Lead Channel Impedance Value: 437 Ohm
Lead Channel Pacing Threshold Amplitude: 0.5 V
Lead Channel Pacing Threshold Amplitude: 0.875 V
Lead Channel Pacing Threshold Amplitude: 1.125 V
Lead Channel Pacing Threshold Pulse Width: 0.4 ms
Lead Channel Pacing Threshold Pulse Width: 0.4 ms
Lead Channel Pacing Threshold Pulse Width: 0.4 ms
Lead Channel Sensing Intrinsic Amplitude: 12.75 mV
Lead Channel Sensing Intrinsic Amplitude: 12.75 mV
Lead Channel Sensing Intrinsic Amplitude: 4.375 mV
Lead Channel Sensing Intrinsic Amplitude: 4.375 mV
Lead Channel Setting Pacing Amplitude: 2.25 V
Lead Channel Setting Pacing Amplitude: 2.5 V
Lead Channel Setting Pacing Pulse Width: 0.4 ms
Lead Channel Setting Pacing Pulse Width: 0.4 ms
Lead Channel Setting Sensing Sensitivity: 0.45 mV
Zone Setting Detection Interval: 300 ms
Zone Setting Detection Interval: 340 ms
Zone Setting Detection Interval: 350 ms
Zone Setting Detection Interval: 450 ms

## 2014-12-06 NOTE — Telephone Encounter (Signed)
Wife will have pt call back regarding remote transmission when he is available.

## 2014-12-06 NOTE — Telephone Encounter (Signed)
Follow up ° ° °Pt returning your call °

## 2014-12-06 NOTE — Telephone Encounter (Signed)
Patient instructed to send next transmission 01-03-15 for battery check. Thoracic impedence abnormal since June- pt denies weight gain, edema and shortness of breath.

## 2014-12-08 ENCOUNTER — Ambulatory Visit (INDEPENDENT_AMBULATORY_CARE_PROVIDER_SITE_OTHER): Payer: Medicare Other

## 2014-12-08 ENCOUNTER — Ambulatory Visit: Payer: Medicare Other | Admitting: Cardiology

## 2014-12-08 DIAGNOSIS — Z7901 Long term (current) use of anticoagulants: Secondary | ICD-10-CM

## 2014-12-08 DIAGNOSIS — I4892 Unspecified atrial flutter: Secondary | ICD-10-CM

## 2014-12-08 LAB — POCT INR: INR: 1.9

## 2014-12-09 ENCOUNTER — Encounter: Payer: Self-pay | Admitting: Cardiology

## 2014-12-15 ENCOUNTER — Ambulatory Visit (INDEPENDENT_AMBULATORY_CARE_PROVIDER_SITE_OTHER): Payer: Medicare Other | Admitting: *Deleted

## 2014-12-15 DIAGNOSIS — E538 Deficiency of other specified B group vitamins: Secondary | ICD-10-CM

## 2014-12-15 MED ORDER — CYANOCOBALAMIN 1000 MCG/ML IJ SOLN
1000.0000 ug | Freq: Once | INTRAMUSCULAR | Status: AC
  Administered 2014-12-15: 1000 ug via INTRAMUSCULAR

## 2014-12-20 ENCOUNTER — Ambulatory Visit (INDEPENDENT_AMBULATORY_CARE_PROVIDER_SITE_OTHER): Payer: Medicare Other | Admitting: Cardiology

## 2014-12-20 ENCOUNTER — Encounter: Payer: Self-pay | Admitting: Cardiology

## 2014-12-20 VITALS — BP 110/78 | HR 82 | Ht 66.0 in | Wt 140.2 lb

## 2014-12-20 DIAGNOSIS — R0602 Shortness of breath: Secondary | ICD-10-CM | POA: Diagnosis not present

## 2014-12-20 DIAGNOSIS — I4892 Unspecified atrial flutter: Secondary | ICD-10-CM

## 2014-12-20 LAB — CBC WITH DIFFERENTIAL/PLATELET
Basophils Absolute: 0.1 10*3/uL (ref 0.0–0.1)
Basophils Relative: 2 % — ABNORMAL HIGH (ref 0–1)
Eosinophils Absolute: 0.3 10*3/uL (ref 0.0–0.7)
Eosinophils Relative: 5 % (ref 0–5)
HCT: 39.1 % (ref 39.0–52.0)
Hemoglobin: 13.6 g/dL (ref 13.0–17.0)
Lymphocytes Relative: 28 % (ref 12–46)
Lymphs Abs: 1.7 10*3/uL (ref 0.7–4.0)
MCH: 29.2 pg (ref 26.0–34.0)
MCHC: 34.8 g/dL (ref 30.0–36.0)
MCV: 83.9 fL (ref 78.0–100.0)
MPV: 10.8 fL (ref 8.6–12.4)
Monocytes Absolute: 0.7 10*3/uL (ref 0.1–1.0)
Monocytes Relative: 11 % (ref 3–12)
Neutro Abs: 3.3 10*3/uL (ref 1.7–7.7)
Neutrophils Relative %: 54 % (ref 43–77)
Platelets: 135 10*3/uL — ABNORMAL LOW (ref 150–400)
RBC: 4.66 MIL/uL (ref 4.22–5.81)
RDW: 14.7 % (ref 11.5–15.5)
WBC: 6.1 10*3/uL (ref 4.0–10.5)

## 2014-12-20 LAB — BASIC METABOLIC PANEL
BUN: 14 mg/dL (ref 7–25)
CO2: 29 mmol/L (ref 20–31)
Calcium: 9.1 mg/dL (ref 8.6–10.3)
Chloride: 104 mmol/L (ref 98–110)
Creat: 0.91 mg/dL (ref 0.70–1.11)
Glucose, Bld: 93 mg/dL (ref 65–99)
Potassium: 4.4 mmol/L (ref 3.5–5.3)
Sodium: 141 mmol/L (ref 135–146)

## 2014-12-20 NOTE — Patient Instructions (Signed)
Your physician wants you to follow-up in: 6 Months. You will receive a reminder letter in the mail two months in advance. If you don't receive a letter, please call our office to schedule the follow-up appointment.  Your physician recommends that you return for lab work Today BMP and CBC

## 2014-12-20 NOTE — Progress Notes (Signed)
HPI [name] presents for follow up of his cardiomyopathy and atrial flutter. Since I last saw him he has done well.   The patient denies any new symptoms such as chest discomfort, neck or arm discomfort. There has been no new shortness of breath, PND or orthopnea. There have been no reported palpitations, presyncope or syncope.  He is on his second crop of tomatoes this year.  Allergies  Allergen Reactions  . Clarithromycin Nausea Only  . Famotidine Other (See Comments)    ABD. PAIN  . Omeprazole Diarrhea  . Oxytetracycline     REACTION: BUMPS  . Penicillins     REACTION: SWELLING Amoxicillin ok  . Proton Pump Inhibitors Other (See Comments)    GI upset, diarrhea, gas, bloating  . Ranitidine Diarrhea  . Doxycycline Rash    Current Outpatient Prescriptions  Medication Sig Dispense Refill  . acetaminophen (TYLENOL) 500 MG tablet Take 250 mg by mouth daily as needed. Pain    . ALPRAZolam (XANAX) 0.25 MG tablet Take 0.25 mg by mouth 3 (three) times daily as needed for anxiety.    Marland Kitchen azithromycin (ZITHROMAX Z-PAK) 250 MG tablet Two on day 1 followed by one daily for 4 days for total of 5 days, PO 6 tablet 0  . carvedilol (COREG) 6.25 MG tablet Take 1 tablet (6.25 mg total) by mouth 2 (two) times daily with a meal. 180 tablet 2  . cyanocobalamin (,VITAMIN B-12,) 1000 MCG/ML injection Inject 1 mL (1,000 mcg total) into the muscle every 30 (thirty) days. Started 03/2014    . docusate sodium (COLACE) 100 MG capsule Take 1 capsule (100 mg total) by mouth daily. (Patient taking differently: Take 100 mg by mouth daily as needed for mild constipation. )    . enalapril (VASOTEC) 5 MG tablet Take 1 tablet (5 mg total) by mouth 2 (two) times daily. 20 tablet 0  . flunisolide (NASAREL) 29 MCG/ACT (0.025%) nasal spray 2 sprays by Nasal route daily. Dose is for each nostril.     . Fluticasone-Salmeterol (ADVAIR DISKUS) 100-50 MCG/DOSE AEPB Inhale 1 puff into the lungs daily as  needed (shortness of breath or wheezing).     . furosemide (LASIX) 20 MG tablet Take 0.5 tablets (10 mg total) by mouth daily. 45 tablet 1  . levalbuterol (XOPENEX HFA) 45 MCG/ACT inhaler Inhale 1-2 puffs into the lungs every 6 (six) hours as needed for wheezing or shortness of breath. 1 Inhaler 1  . Loratadine 10 MG CAPS Take 1 capsule by mouth daily as needed (allergies).     . montelukast (SINGULAIR) 10 MG tablet Take 10 mg by mouth daily as needed.      . sucralfate (CARAFATE) 1 G tablet TAKE 1 TABLET BY MOUTH AS NEEDED for stomach issues    . warfarin (COUMADIN) 5 MG tablet Take as directed by the anticoagulation clinic 135 tablet 1   No current facility-administered medications for this visit.    Past Medical History  Diagnosis Date  . NICM (nonischemic cardiomyopathy)     Cardiac catheterization March 2006 without coronary disease; echocardiogram August 2008: EF 35%, mild AI, left atrial enlargement  . Atrial flutter     A.  Status post cardioversion; B.  Tikosyn therapy - failed, remains in aflutte  . COPD (chronic obstructive pulmonary disease)     spirometry 2015 - no obstruction, + mild restrictive lung disease  . LBBB (left bundle branch block)   . GERD (gastroesophageal reflux disease) 2010  h/o esophageal stricture with dilation, LA grade C reflux esophagitis by EGD 2010  . Porphyria   . IBS (irritable bowel syndrome)   . BPH (benign prostatic hyperplasia)   . History of diverticulitis of colon   . Allergic rhinitis     [name]  . Chronic systolic congestive heart failure   . History of GI bleed     Secondary to hemorrhoids  . Extrinsic asthma     [name]  . 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 Lucky Cowboy)  . Multiple pulmonary nodules 06/2013    RUL ([facility]) - ?vasculitis as of last CT at [facility]  . Chronic sinusitis   . Goiter     Past Surgical History  Procedure Laterality Date  . Penile prosthesis implant  2007     Otelin  . Cholecystectomy    . Goiter removal    . Nose surgery  1971  . Hernia repair    . Cardiac catheterization  06/22/04    Severe, nonischemic cardiomyopathy,EF 25-30%  . Biv-aicd implant      [employer]  . Colonoscopy  10/99    Divertic, splenic, hepatic fleure only  . Esophagogastroduodenoscopy  01/1998    stricture, sliding HH, GERD  . Esophageal dilation  02/18/98  . Esophagogastroduodenoscopy  04/08/01    stricture, gastritis, HH, GERD-no dilation(Dr. Henrene Pastor)  . Hernia repair  01/30/02    Dr. Hassell Done  . Pulmonary eval  04/2002    ([facility]) Chronic congestive symptoms  . US echocardiography  07/13/04    EF 25-30%, Mod LVH; LA severe dilation; Mild AR; IRTR  . Esophagogastroduodenoscopy  12/22/04    stricture; gastritis; duodenitis, GERD  . US echocardiography  11/27/06    hypokinesis posterior wall, EF 35%; mild AR  . Esophagogastroduodenoscopy  10/21/08    Reflux esophagitis; Erythem. Duod. (Dr. Vira Agar)  . Colonoscopy  10/21/08    aborted-divertics, int hemms (Dr. Vira Agar)  . Cardioversion  02/22/09    AFlutter-([facility])  . Coronary angioplasty    . Back surgery  1997    bulging disks  . Esophagogastroduodenoscopy  08/2013    erythema gastric fundus - minimal gastritis, HH ([facility])  . Pft  10/2013    FVC 61%, FEV1 63%, ratio 0.76    ROS:  Left shoulder pain, constipation.   Otherwise as stated in the HPI and negative for all other systems.  PHYSICAL EXAM BP 110/78 mmHg  Pulse 82  Ht 5\' 6"  (1.676 m)  Wt 140 lb 3.2 oz (63.594 kg)  BMI 22.64 kg/m2 GENERAL:  Well appearing NECK:  No jugular venous distention, waveform within normal limits, carotid upstroke brisk and symmetric, no bruits, no thyromegaly LUNGS:  Diffuse fine crackles CHEST:  Well healed ICD pocket HEART:  PMI not displaced or sustained,S1 and S2 within normal limits, no S3, no clicks, no rubs, 2 out of 6 apical diastolic murmur short and nonradiating  ABD:  Flat, positive bowel sounds normal in frequency in pitch,  no bruits, no rebound, no guarding, no midline pulsatile mass, no hepatomegaly, no splenomegaly EXT:  2 plus pulses throughout, no edema, no cyanosis no clubbing  EKG:  Atrial flutter, demand ventricular pacemaker rate 63.  No change from previous  12/20/2014  ASSESSMENT AND PLAN  Nonischemic cardiomyopathy -  He seems to be euvolemic. No change in therapy is indicated.   Atrial flutter -  The patient tolerates this rhythm and rate control and anticoagulation.  We will continue with the meds as listed. He does not want to switch from warfarin.  I will check a CBC and basic metabolic profile today.  Pulmonary HTN - This was noted at the time of his last echo. However, he really have physical findings consistent with this her RV failure. He has no overt symptoms. No change in therapy is indicated.

## 2015-01-03 ENCOUNTER — Ambulatory Visit (INDEPENDENT_AMBULATORY_CARE_PROVIDER_SITE_OTHER): Payer: Medicare Other | Admitting: *Deleted

## 2015-01-03 DIAGNOSIS — Z95810 Presence of automatic (implantable) cardiac defibrillator: Secondary | ICD-10-CM

## 2015-01-04 DIAGNOSIS — M75122 Complete rotator cuff tear or rupture of left shoulder, not specified as traumatic: Secondary | ICD-10-CM | POA: Diagnosis not present

## 2015-01-04 LAB — CUP PACEART REMOTE DEVICE CHECK
Battery Voltage: 2.63 V
Brady Statistic AP VP Percent: 0 %
Brady Statistic AP VS Percent: 0 %
Brady Statistic AS VP Percent: 97.18 %
Brady Statistic AS VS Percent: 2.82 %
Brady Statistic RA Percent Paced: 0 %
Brady Statistic RV Percent Paced: 97.18 %
Date Time Interrogation Session: 20160926052409
HighPow Impedance: 361 Ohm
HighPow Impedance: 39 Ohm
HighPow Impedance: 50 Ohm
Lead Channel Impedance Value: 200 Ohm
Lead Channel Impedance Value: 285 Ohm
Lead Channel Impedance Value: 361 Ohm
Lead Channel Impedance Value: 437 Ohm
Lead Channel Impedance Value: 437 Ohm
Lead Channel Pacing Threshold Amplitude: 0.5 V
Lead Channel Pacing Threshold Amplitude: 0.875 V
Lead Channel Pacing Threshold Amplitude: 0.875 V
Lead Channel Pacing Threshold Pulse Width: 0.4 ms
Lead Channel Pacing Threshold Pulse Width: 0.4 ms
Lead Channel Pacing Threshold Pulse Width: 0.4 ms
Lead Channel Sensing Intrinsic Amplitude: 10.5 mV
Lead Channel Sensing Intrinsic Amplitude: 10.5 mV
Lead Channel Sensing Intrinsic Amplitude: 3.875 mV
Lead Channel Sensing Intrinsic Amplitude: 3.875 mV
Lead Channel Setting Pacing Amplitude: 2 V
Lead Channel Setting Pacing Amplitude: 2.5 V
Lead Channel Setting Pacing Pulse Width: 0.4 ms
Lead Channel Setting Pacing Pulse Width: 0.4 ms
Lead Channel Setting Sensing Sensitivity: 0.45 mV
Zone Setting Detection Interval: 300 ms
Zone Setting Detection Interval: 340 ms
Zone Setting Detection Interval: 350 ms
Zone Setting Detection Interval: 450 ms

## 2015-01-04 NOTE — Progress Notes (Signed)
Remote ICD transmission.   

## 2015-01-05 ENCOUNTER — Ambulatory Visit (INDEPENDENT_AMBULATORY_CARE_PROVIDER_SITE_OTHER): Payer: Medicare Other

## 2015-01-05 DIAGNOSIS — Z7901 Long term (current) use of anticoagulants: Secondary | ICD-10-CM

## 2015-01-05 DIAGNOSIS — I4892 Unspecified atrial flutter: Secondary | ICD-10-CM | POA: Diagnosis not present

## 2015-01-05 LAB — POCT INR: INR: 3.1

## 2015-01-07 ENCOUNTER — Encounter: Payer: Self-pay | Admitting: Cardiology

## 2015-01-20 ENCOUNTER — Ambulatory Visit (INDEPENDENT_AMBULATORY_CARE_PROVIDER_SITE_OTHER): Payer: Medicare Other | Admitting: *Deleted

## 2015-01-20 DIAGNOSIS — E538 Deficiency of other specified B group vitamins: Secondary | ICD-10-CM | POA: Diagnosis not present

## 2015-01-20 MED ORDER — CYANOCOBALAMIN 1000 MCG/ML IJ SOLN
1000.0000 ug | Freq: Once | INTRAMUSCULAR | Status: AC
  Administered 2015-01-20: 1000 ug via INTRAMUSCULAR

## 2015-01-21 DIAGNOSIS — M75122 Complete rotator cuff tear or rupture of left shoulder, not specified as traumatic: Secondary | ICD-10-CM | POA: Diagnosis not present

## 2015-01-24 ENCOUNTER — Encounter: Payer: Self-pay | Admitting: Internal Medicine

## 2015-01-25 ENCOUNTER — Encounter: Payer: Self-pay | Admitting: Cardiology

## 2015-01-26 ENCOUNTER — Ambulatory Visit (INDEPENDENT_AMBULATORY_CARE_PROVIDER_SITE_OTHER): Payer: Medicare Other

## 2015-01-26 DIAGNOSIS — I4892 Unspecified atrial flutter: Secondary | ICD-10-CM

## 2015-01-26 DIAGNOSIS — Z7901 Long term (current) use of anticoagulants: Secondary | ICD-10-CM

## 2015-01-26 LAB — POCT INR: INR: 2.2

## 2015-01-29 ENCOUNTER — Encounter: Payer: Self-pay | Admitting: Family Medicine

## 2015-01-31 ENCOUNTER — Ambulatory Visit (INDEPENDENT_AMBULATORY_CARE_PROVIDER_SITE_OTHER): Payer: Medicare Other | Admitting: *Deleted

## 2015-01-31 DIAGNOSIS — Z95810 Presence of automatic (implantable) cardiac defibrillator: Secondary | ICD-10-CM

## 2015-02-01 NOTE — Progress Notes (Signed)
Remote ICD transmission.   

## 2015-02-02 ENCOUNTER — Other Ambulatory Visit: Payer: Self-pay | Admitting: Cardiology

## 2015-02-04 ENCOUNTER — Encounter: Payer: Self-pay | Admitting: Cardiology

## 2015-02-04 LAB — CUP PACEART REMOTE DEVICE CHECK
Battery Voltage: 2.63 V
Brady Statistic AP VP Percent: 0 %
Brady Statistic AP VS Percent: 0 %
Brady Statistic AS VP Percent: 91.6 %
Brady Statistic AS VS Percent: 8.4 %
Brady Statistic RA Percent Paced: 0 %
Brady Statistic RV Percent Paced: 91.6 %
Date Time Interrogation Session: 20161024052409
HighPow Impedance: 361 Ohm
HighPow Impedance: 41 Ohm
HighPow Impedance: 51 Ohm
Implantable Lead Implant Date: 20070525
Implantable Lead Implant Date: 20070525
Implantable Lead Implant Date: 20070525
Implantable Lead Location: 753858
Implantable Lead Location: 753859
Implantable Lead Location: 753860
Implantable Lead Model: 4194
Implantable Lead Model: 5076
Implantable Lead Model: 6949
Lead Channel Impedance Value: 209 Ohm
Lead Channel Impedance Value: 285 Ohm
Lead Channel Impedance Value: 399 Ohm
Lead Channel Impedance Value: 437 Ohm
Lead Channel Impedance Value: 494 Ohm
Lead Channel Pacing Threshold Amplitude: 0.5 V
Lead Channel Pacing Threshold Amplitude: 0.75 V
Lead Channel Pacing Threshold Amplitude: 1 V
Lead Channel Pacing Threshold Pulse Width: 0.4 ms
Lead Channel Pacing Threshold Pulse Width: 0.4 ms
Lead Channel Pacing Threshold Pulse Width: 0.4 ms
Lead Channel Sensing Intrinsic Amplitude: 13.625 mV
Lead Channel Sensing Intrinsic Amplitude: 13.625 mV
Lead Channel Sensing Intrinsic Amplitude: 4.5 mV
Lead Channel Sensing Intrinsic Amplitude: 4.5 mV
Lead Channel Setting Pacing Amplitude: 2 V
Lead Channel Setting Pacing Amplitude: 2.5 V
Lead Channel Setting Pacing Pulse Width: 0.4 ms
Lead Channel Setting Pacing Pulse Width: 0.4 ms
Lead Channel Setting Sensing Sensitivity: 0.45 mV

## 2015-02-10 DIAGNOSIS — J4530 Mild persistent asthma, uncomplicated: Secondary | ICD-10-CM | POA: Diagnosis not present

## 2015-02-10 DIAGNOSIS — K219 Gastro-esophageal reflux disease without esophagitis: Secondary | ICD-10-CM | POA: Diagnosis not present

## 2015-02-10 DIAGNOSIS — H1045 Other chronic allergic conjunctivitis: Secondary | ICD-10-CM | POA: Diagnosis not present

## 2015-02-10 DIAGNOSIS — J300 Vasomotor rhinitis: Secondary | ICD-10-CM | POA: Diagnosis not present

## 2015-02-22 ENCOUNTER — Other Ambulatory Visit: Payer: Self-pay | Admitting: Cardiology

## 2015-02-23 ENCOUNTER — Ambulatory Visit (INDEPENDENT_AMBULATORY_CARE_PROVIDER_SITE_OTHER): Payer: Medicare Other

## 2015-02-23 DIAGNOSIS — I4892 Unspecified atrial flutter: Secondary | ICD-10-CM | POA: Diagnosis not present

## 2015-02-23 DIAGNOSIS — Z7901 Long term (current) use of anticoagulants: Secondary | ICD-10-CM | POA: Diagnosis not present

## 2015-02-23 LAB — POCT INR: INR: 2

## 2015-03-02 ENCOUNTER — Other Ambulatory Visit: Payer: Self-pay

## 2015-03-08 ENCOUNTER — Encounter: Payer: Self-pay | Admitting: Internal Medicine

## 2015-03-08 ENCOUNTER — Ambulatory Visit (INDEPENDENT_AMBULATORY_CARE_PROVIDER_SITE_OTHER): Payer: Medicare Other | Admitting: Internal Medicine

## 2015-03-08 VITALS — BP 122/80 | HR 85 | Ht 66.0 in | Wt 142.6 lb

## 2015-03-08 DIAGNOSIS — I429 Cardiomyopathy, unspecified: Secondary | ICD-10-CM | POA: Diagnosis not present

## 2015-03-08 DIAGNOSIS — Z95810 Presence of automatic (implantable) cardiac defibrillator: Secondary | ICD-10-CM | POA: Diagnosis not present

## 2015-03-08 DIAGNOSIS — Z7901 Long term (current) use of anticoagulants: Secondary | ICD-10-CM

## 2015-03-08 DIAGNOSIS — I5022 Chronic systolic (congestive) heart failure: Secondary | ICD-10-CM

## 2015-03-08 DIAGNOSIS — I484 Atypical atrial flutter: Secondary | ICD-10-CM

## 2015-03-08 NOTE — Assessment & Plan Note (Signed)
He is tolerating systemic anti-coagulation.

## 2015-03-08 NOTE — Assessment & Plan Note (Signed)
He remains out of rhythm in what appears to be left atrial flutter. His rate is well controlled.

## 2015-03-08 NOTE — Assessment & Plan Note (Addendum)
His symptoms remain class 2. He will continue his current meds. He will continue a low sodium diet.

## 2015-03-08 NOTE — Progress Notes (Signed)
HPI [name] returns today for followup. He is a very pleasant 81 year old man with a nonischemic cardiomyopathy, complete heart block, chronic systolic heart failure, chronic atrial fibrillation, status post biventricular ICD implantation secondary to all the above in the setting of left bundle branch block. The patient denies chest pain, shortness of breath, or peripheral edema. He has done well since his last visit. He is approaching ERI. Allergies  Allergen Reactions  . Clarithromycin Nausea Only  . Famotidine Other (See Comments)    ABD. PAIN  . Omeprazole Diarrhea  . Oxytetracycline     REACTION: BUMPS  . Penicillins     REACTION: SWELLING Amoxicillin ok  . Proton Pump Inhibitors Other (See Comments)    GI upset, diarrhea, gas, bloating  . Ranitidine Diarrhea  . Doxycycline Rash     Current Outpatient Prescriptions  Medication Sig Dispense Refill  . acetaminophen (TYLENOL) 500 MG tablet Take 250 mg by mouth daily as needed. Pain    . ALPRAZolam (XANAX) 0.25 MG tablet Take 0.25 mg by mouth 3 (three) times daily as needed for anxiety.    . carvedilol (COREG) 6.25 MG tablet TAKE 1 TABLET (6.25 MG TOTAL) BY MOUTH 2 (TWO) TIMES DAILY WITH MEALS 180 tablet 1  . cyanocobalamin (,VITAMIN B-12,) 1000 MCG/ML injection Inject 1 mL (1,000 mcg total) into the muscle every 30 (thirty) days. Started 03/2014    . docusate sodium (COLACE) 100 MG capsule Take 1 capsule (100 mg total) by mouth daily. (Patient taking differently: Take 100 mg by mouth daily as needed for mild constipation. )    . enalapril (VASOTEC) 5 MG tablet Take 1 tablet (5 mg total) by mouth 2 (two) times daily. 20 tablet 0  . flunisolide (NASAREL) 29 MCG/ACT (0.025%) nasal spray 2 sprays by Nasal route daily. Dose is for each nostril.     . Fluticasone-Salmeterol (ADVAIR DISKUS) 100-50 MCG/DOSE AEPB Inhale 1 puff into the lungs daily as needed (shortness of breath or wheezing).     . furosemide (LASIX) 20 MG tablet Take 0.5  tablets (10 mg total) by mouth daily. 45 tablet 1  . levalbuterol (XOPENEX HFA) 45 MCG/ACT inhaler Inhale 1-2 puffs into the lungs every 6 (six) hours as needed for wheezing or shortness of breath. 1 Inhaler 1  . Loratadine 10 MG CAPS Take 1 capsule by mouth daily as needed (allergies).     . montelukast (SINGULAIR) 10 MG tablet Take 10 mg by mouth daily as needed (BREATHING).     Marland Kitchen sucralfate (CARAFATE) 1 G tablet TAKE 1 TABLET BY MOUTH AS DAILY NEEDED for stomach issues    . warfarin (COUMADIN) 5 MG tablet TAKE AS DIRECTED  BY  ANTICOAGULATION  CLINIC 135 tablet 1   No current facility-administered medications for this visit.     Past Medical History  Diagnosis Date  . NICM (nonischemic cardiomyopathy) [facility])     Cardiac catheterization March 2006 without coronary disease; echocardiogram August 2008: EF 35%, mild AI, left atrial enlargement  . Atrial flutter ([place])     A.  Status post cardioversion; B.  Tikosyn therapy - failed, remains in aflutte  . COPD (chronic obstructive pulmonary disease) ([place])     spirometry 2015 - no obstruction, + mild restrictive lung disease  . LBBB (left bundle branch block)   . GERD (gastroesophageal reflux disease) 2010    h/o esophageal stricture with dilation, LA grade C reflux esophagitis by EGD 2010  . Porphyria ([place])   . IBS (irritable bowel syndrome)   .  BPH (benign prostatic hyperplasia)   . History of diverticulitis of colon   . Allergic rhinitis     [name]  . Chronic systolic congestive heart failure ([place])   . History of GI bleed     Secondary to hemorrhoids  . Extrinsic asthma     [name]  . 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[place]) 10/2011    1.2 cm, rec f/u 6 mo (Dr. Lucky Cowboy)  . Multiple pulmonary nodules 06/2013    RUL ([facility]. Adam Phenix at [facility]) - ?vasculitis as of last CT at [facility]  . Chronic sinusitis   . Goiter   . Bilateral pneumonia 11/02/2013    ROS:   All systems reviewed and negative except as noted in the HPI.   Past Surgical  History  Procedure Laterality Date  . Penile prosthesis implant  2007    Otelin  . Cholecystectomy    . Goiter removal    . Nose surgery  1971  . Hernia repair    . Cardiac catheterization  06/22/04    Severe, nonischemic cardiomyopathy,EF 25-30%  . Biv-aicd implant      [employer]  . Colonoscopy  10/99    Divertic, splenic, hepatic fleure only  . Esophagogastroduodenoscopy  01/1998    stricture, sliding HH, GERD  . Esophageal dilation  02/18/98  . Esophagogastroduodenoscopy  04/08/01    stricture, gastritis, HH, GERD-no dilation(Dr. Henrene Pastor)  . Hernia repair  01/30/02    Dr. Hassell Done  . Pulmonary eval  04/2002    ([facility]) Chronic congestive symptoms  . US echocardiography  07/13/04    EF 25-30%, Mod LVH; LA severe dilation; Mild AR; IRTR  . Esophagogastroduodenoscopy  12/22/04    stricture; gastritis; duodenitis, GERD  . US echocardiography  11/27/06    hypokinesis posterior wall, EF 35%; mild AR  . Esophagogastroduodenoscopy  10/21/08    Reflux esophagitis; Erythem. Duod. (Dr. Vira Agar)  . Colonoscopy  10/21/08    aborted-divertics, int hemms (Dr. Vira Agar)  . Cardioversion  02/22/09    AFlutter-([facility])  . Coronary angioplasty    . Back surgery  1997    bulging disks  . Esophagogastroduodenoscopy  08/2013    erythema gastric fundus - minimal gastritis, HH ([facility])  . Pft  10/2013    FVC 61%, FEV1 63%, ratio 0.76     Family History  Problem Relation Age of Onset  . Coronary artery disease Father   . Hypertension Father   . Dysphagia Sister   . Breast cancer    . Ovarian cancer    . Uterine cancer    . Heart failure Mother   . Other Sister     stomach problems  . Other Sister     stomach problems  . Other Sister     stomach problems  . Alcohol abuse Maternal Uncle      Social History   Social History  . Marital Status: Married    Spouse Name: N/A  . Number of Children: 3  . Years of Education: N/A   Occupational History  . retired   .     Social History Main Topics   . Smoking status: Never Smoker   . Smokeless tobacco: Never Used  . Alcohol Use: No  . Drug Use: No  . Sexual Activity: Not on file   Other Topics Concern  . Not on file   Social History Narrative   Lives with wife   3 children-8 grandchildren   Still works on a farm.   Retired  from [employer]   Had greenhouse   Good friend with Dr. Jefm Bryant   Activity: No regular exercise   Diet: good water, fruits/vegetables daily     BP 122/80 mmHg  Pulse 85  Ht 5\' 6"  (1.676 m)  Wt 142 lb 9.6 oz (64.683 kg)  BMI 23.03 kg/m2  Physical Exam:  Well appearing 81 year old man, NAD HEENT: Unremarkable Neck:  6 cm JVD, no thyromegally Lungs:  Clear with no wheezes, rales, or rhonchi. Well-healed ICD incision. HEART:  Regular rate rhythm, no murmurs, no rubs, no clicks Abd:  soft, positive bowel sounds, no organomegally, no rebound, no guarding Ext:  2 plus pulses, no edema, no cyanosis, no clubbing Skin:  No rashes no nodules Neuro:  CN II through XII intact, motor grossly intact  DEVICE  Normal device function.  See PaceArt for details.Approaching ERI.  Assess/Plan:

## 2015-03-08 NOTE — Patient Instructions (Signed)
Medication Instructions:  Your physician recommends that you continue on your current medications as directed. Please refer to the Current Medication list given to you today.   Labwork: None ordered   Testing/Procedures: None ordered   Follow-Up: Your physician wants you to follow-up in: 12 months with Dr Knox Saliva will receive a reminder letter in the mail two months in advance. If you don't receive a letter, please call our office to schedule the follow-up appointment.  Remote monitoring is used to monitor your  ICD from home. This monitoring reduces the number of office visits required to check your device to one time per year. It allows [place] to keep an eye on the functioning of your device to ensure it is working properly. You are scheduled for a device check from home on 04/12/15. You may send your transmission at any time that day. If you have a wireless device, the transmission will be sent automatically. After your physician reviews your transmission, you will receive a postcard with your next transmission date.     Any Other Special Instructions Will Be Listed Below (If Applicable).     If you need a refill on your cardiac medications before your next appointment, please call your pharmacy.

## 2015-03-08 NOTE — Assessment & Plan Note (Signed)
His ICD is approaching ERI. Will schedule gen change when this occurs. I discussed whether to proceed with BiV ICD or BiV PPM. He is considering his options.

## 2015-03-09 ENCOUNTER — Ambulatory Visit: Payer: Medicare Other | Admitting: *Deleted

## 2015-03-10 NOTE — Progress Notes (Signed)
Opened in error

## 2015-03-17 ENCOUNTER — Ambulatory Visit (INDEPENDENT_AMBULATORY_CARE_PROVIDER_SITE_OTHER): Payer: Medicare Other

## 2015-03-17 DIAGNOSIS — E538 Deficiency of other specified B group vitamins: Secondary | ICD-10-CM

## 2015-03-17 MED ORDER — CYANOCOBALAMIN 1000 MCG/ML IJ SOLN
1000.0000 ug | Freq: Once | INTRAMUSCULAR | Status: AC
  Administered 2015-03-17: 1000 ug via INTRAMUSCULAR

## 2015-03-18 ENCOUNTER — Encounter: Payer: Self-pay | Admitting: Family Medicine

## 2015-03-18 ENCOUNTER — Ambulatory Visit (INDEPENDENT_AMBULATORY_CARE_PROVIDER_SITE_OTHER): Payer: Medicare Other | Admitting: Family Medicine

## 2015-03-18 ENCOUNTER — Telehealth: Payer: Self-pay | Admitting: *Deleted

## 2015-03-18 VITALS — BP 124/72 | HR 74 | Temp 97.5°F | Wt 142.5 lb

## 2015-03-18 DIAGNOSIS — J019 Acute sinusitis, unspecified: Secondary | ICD-10-CM

## 2015-03-18 MED ORDER — AMOXICILLIN 875 MG PO TABS: 875.0000 mg | ORAL_TABLET | Freq: Two times a day (BID) | ORAL | Status: DC

## 2015-03-18 MED ORDER — ALPRAZOLAM 0.25 MG PO TABS: 0.2500 mg | ORAL_TABLET | Freq: Every day | ORAL | Status: DC

## 2015-03-18 NOTE — Telephone Encounter (Signed)
Telephoned pt back, informed him that Amoxicillin 875mg s BID for 7 days does not affect INR. Continue same Coumadin instructions.

## 2015-03-18 NOTE — Progress Notes (Signed)
BP 124/72 mmHg  Pulse 74  Temp(Src) 97.5 F (36.4 C) (Oral)  Wt 142 lb 8 oz (64.638 kg)  SpO2 96%   CC: sinusitis   Subjective:    Patient ID: [name], male    DOB: [birth year], 81 y.o.   MRN: [id number]  HPI: [name] is a 81 y.o. male presenting on 03/18/2015 for Sinusitis   Pleasant patient with multiple comorbidities including NICM, COPD, chronic sCHF asthma/allergic rhinitis and history of chronic sinusitis presents today with 2-3 wk h/o head congestion and yellow sinus drainage. 2 wks ago he took zpack prescription provided by [facility] pulmonologist. This helped sinus pressure headache, but notes persistent drainage.  No fevers, coughing, ear pain.   Taking clariitn daily and mucinex nightly.   He did have nasal sinus surgery by Dr Carlis Abbott several years ago.  PCN, doxycycline and clarithromycin allergy/intolerance  H/o bilateral pneumonia 10/2013. F/u CXR at [facility] pulm showed resolution of PNA. Has declined pneumococcal vaccinations in the past.  Requests xanax refill - takes 1/2 tablet at night to help sleep. This was prescribed by pulm at [facility].   Relevant past medical, surgical, family and social history reviewed and updated as indicated. Interim medical history since our last visit reviewed. Allergies and medications reviewed and updated. Current Outpatient Prescriptions on File Prior to Visit  Medication Sig  . acetaminophen (TYLENOL) 500 MG tablet Take 250 mg by mouth daily as needed. Pain  . carvedilol (COREG) 6.25 MG tablet TAKE 1 TABLET (6.25 MG TOTAL) BY MOUTH 2 (TWO) TIMES DAILY WITH MEALS  . cyanocobalamin (,VITAMIN B-12,) 1000 MCG/ML injection Inject 1 mL (1,000 mcg total) into the muscle every 30 (thirty) days. Started 03/2014  . docusate sodium (COLACE) 100 MG capsule Take 1 capsule (100 mg total) by mouth daily. (Patient taking differently: Take 100 mg by mouth daily as needed for mild constipation. )  . enalapril (VASOTEC) 5 MG  tablet Take 1 tablet (5 mg total) by mouth 2 (two) times daily.  . flunisolide (NASAREL) 29 MCG/ACT (0.025%) nasal spray 2 sprays by Nasal route daily. Dose is for each nostril.   . furosemide (LASIX) 20 MG tablet Take 0.5 tablets (10 mg total) by mouth daily.  Marland Kitchen levalbuterol (XOPENEX HFA) 45 MCG/ACT inhaler Inhale 1-2 puffs into the lungs every 6 (six) hours as needed for wheezing or shortness of breath.  . Loratadine 10 MG CAPS Take 1 capsule by mouth daily as needed (allergies).   . montelukast (SINGULAIR) 10 MG tablet Take 10 mg by mouth daily as needed (BREATHING).   Marland Kitchen sucralfate (CARAFATE) 1 G tablet TAKE 1 TABLET BY MOUTH AS DAILY NEEDED for stomach issues  . warfarin (COUMADIN) 5 MG tablet TAKE AS DIRECTED  BY  ANTICOAGULATION  CLINIC   No current facility-administered medications on file prior to visit.   Past Medical History  Diagnosis Date  . NICM (nonischemic cardiomyopathy) [facility])     Cardiac catheterization March 2006 without coronary disease; echocardiogram August 2008: EF 35%, mild AI, left atrial enlargement  . Atrial flutter ([place])     A.  Status post cardioversion; B.  Tikosyn therapy - failed, remains in aflutter  . COPD (chronic obstructive pulmonary disease) ([place])     spirometry 2015 - no obstruction, + mild restrictive lung disease  . LBBB (left bundle branch block)   . GERD (gastroesophageal reflux disease) 2010    h/o esophageal stricture with dilation, LA grade C reflux esophagitis by EGD 2010  . Porphyria (  [facility])   . IBS (irritable bowel syndrome)   . BPH (benign prostatic hyperplasia)   . History of diverticulitis of colon   . Allergic rhinitis     [name]  . Chronic systolic congestive heart failure ([place])   . History of GI bleed     Secondary to hemorrhoids  . Extrinsic asthma     [name]  . 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[place]) 10/2011    1.2 cm, rec f/u 6 mo (Dr. Lucky Cowboy)  . Multiple pulmonary nodules 06/2013    RUL ([facility] Adam Phenix [facility]) - ?vasculitis as  of last CT at [facility]  . Chronic sinusitis   . Goiter   . Bilateral pneumonia 11/02/2013    treated with levaquin    Review of Systems Per HPI unless specifically indicated in ROS section     Objective:    BP 124/72 mmHg  Pulse 74  Temp(Src) 97.5 F (36.4 C) (Oral)  Wt 142 lb 8 oz (64.638 kg)  SpO2 96%  Wt Readings from Last 3 Encounters:  03/18/15 142 lb 8 oz (64.638 kg)  03/08/15 142 lb 9.6 oz (64.683 kg)  12/20/14 140 lb 3.2 oz (63.594 kg)    Physical Exam  Constitutional: He appears well-developed and well-nourished. No distress.  HENT:  Head: Normocephalic and atraumatic.  Right Ear: Hearing, tympanic membrane, external ear and ear canal normal.  Left Ear: Hearing, tympanic membrane, external ear and ear canal normal.  Nose: Mucosal edema and rhinorrhea present. Right sinus exhibits no maxillary sinus tenderness and no frontal sinus tenderness. Left sinus exhibits no maxillary sinus tenderness and no frontal sinus tenderness.  Mouth/Throat: Uvula is midline, oropharynx is clear and moist and mucous membranes are normal. No oropharyngeal exudate, posterior oropharyngeal edema, posterior oropharyngeal erythema or tonsillar abscesses.  Eyes: Conjunctivae and EOM are normal. Pupils are equal, round, and reactive to light. No scleral icterus.  Neck: Normal range of motion. Neck supple.  Cardiovascular: Normal rate, regular rhythm, normal heart sounds and intact distal pulses.   No murmur heard. Pulmonary/Chest: Effort normal and breath sounds normal. No respiratory distress. He has no wheezes. He has no rales.  Lungs clear  Lymphadenopathy:    He has no cervical adenopathy.  Skin: Skin is warm and dry. No rash noted.  Nursing note and vitals reviewed.  Results for orders placed or performed in visit on 02/23/15  POCT INR  Result Value Ref Range   INR 2.0    Lab Results  Component Value Date   CREATININE 0.91 12/20/2014       Assessment & Plan:   Problem List Items  Addressed This Visit    Acute sinusitis - Primary    Ongoing for several weeks, no improvement after zpack 2 wks ago, persistent predominantly R sided sinus drainage despite loratadine and intranasal steroid. Continue antihistamine and mucinex. Treat with amox 7d course. Pt has PCN allergy (swelling) but states he has taken and tolerated amoxicillin well in the past.  Update if no improvement after treatment. Pt agrees with plan.      Relevant Medications   amoxicillin (AMOXIL) 875 MG tablet       Follow up plan: Return if symptoms worsen or fail to improve.

## 2015-03-18 NOTE — Assessment & Plan Note (Addendum)
Ongoing for several weeks, no improvement after zpack 2 wks ago, persistent predominantly R sided sinus drainage despite loratadine and intranasal steroid. Continue antihistamine and mucinex. Treat with amox 7d course. Pt has PCN allergy (swelling) but states he has taken and tolerated amoxicillin well in the past.  Update if no improvement after treatment. Pt agrees with plan.

## 2015-03-18 NOTE — Progress Notes (Signed)
Pre visit review using our clinic review tool, if applicable. No additional management support is needed unless otherwise documented below in the visit note. 

## 2015-03-18 NOTE — Telephone Encounter (Signed)
Patient left a voicemail on the refill line requesting a call from [place]. He stated that he is currently on an antibiotic. The call back number provided by the patient was [phone]. Thanks, MI

## 2015-03-18 NOTE — Patient Instructions (Addendum)
Call us with name of new inhaler -?dulera. For persistent sinusitis and drainage - start amoxicillin - watch for swelling. Let us know if not better with this.  Continue nasal steroid and loratadine.

## 2015-03-22 ENCOUNTER — Telehealth: Payer: Self-pay | Admitting: Family Medicine

## 2015-03-22 MED ORDER — ONDANSETRON HCL 4 MG PO TABS: 4.0000 mg | ORAL_TABLET | Freq: Three times a day (TID) | ORAL | Status: DC | PRN

## 2015-03-22 NOTE — Telephone Encounter (Signed)
Took 2-3 days of abx. Let's stop amoxicillin.  May take zofran for nausea - sent to [facility].  Would see how drainage does off abx.

## 2015-03-22 NOTE — Telephone Encounter (Signed)
Patient notified

## 2015-03-22 NOTE — Telephone Encounter (Signed)
[facility] Patient Name: [name] [facility] DOB: October 13, 1931 Initial Comment Caller states her husband was seen on Friday, he had sinus drainage, and was put amoxicillan, it made his stomach burn, he stopped taking the medication. Nausea Nurse Assessment Nurse: Mechele Dawley, RN, Amy Date/Time [name] Time): 03/22/2015 8:57:05 AM Confirm and document reason for call. If symptomatic, describe symptoms. ---CALLER STATES THAT HE GOT AN ANTIBIOTIC FOR SINUS DRAINAGE. HE TOOK THE MEDS UP TO SUNDAY - FELT NAUSEATED AND VOMITED AND GAGGED ALL DAY YESTERDAY. HE FEELS VERY NAUSEATED. HE SLEPT GOOD LAST NIGHT AND FEELS LIKE HE NEEDS TO VOMIT. HE THINKS HE FEELS LIKE HE NEEDS SOMETHING FOR THE NAUSEA AND SOMETHING FOR THE SICK STOMACH SO HE CAN TAKE THE ANTIBIOTIC. Has the patient traveled out of the country within the last 30 days? ---Not Applicable Does the patient have any new or worsening symptoms? ---Yes Will a triage be completed? ---Yes Related visit to physician within the last 2 weeks? ---Yes Does the PT have any chronic conditions? (i.e. diabetes, asthma, etc.) ---Unknown Is this a behavioral health or substance abuse call? ---No Guidelines Guideline Title Affirmed Question Affirmed Notes Vomiting Vomiting a prescription medication Final Disposition User Call PCP within 24 Hours [place], Therapist, sports, Amy Comments HE IS WANTING TO GET SOMETHING FOR NAUSEA CALLED IN SO HE CAN TAKE THE ANTIBIOTIC. HE IS EXPECTING A CALL FROM THE OFFICE SOMETIME TODAY. Referrals REFERRED TO PCP OFFICE REFERRED TO PCP OFFICE Disagree/Comply: Comply

## 2015-03-23 ENCOUNTER — Ambulatory Visit: Payer: Self-pay | Admitting: Family Medicine

## 2015-03-23 DIAGNOSIS — R112 Nausea with vomiting, unspecified: Secondary | ICD-10-CM | POA: Diagnosis not present

## 2015-03-25 ENCOUNTER — Telehealth: Payer: Self-pay

## 2015-03-25 DIAGNOSIS — F411 Generalized anxiety disorder: Secondary | ICD-10-CM | POA: Diagnosis not present

## 2015-03-25 DIAGNOSIS — R918 Other nonspecific abnormal finding of lung field: Secondary | ICD-10-CM | POA: Diagnosis not present

## 2015-03-25 DIAGNOSIS — F5104 Psychophysiologic insomnia: Secondary | ICD-10-CM | POA: Diagnosis not present

## 2015-03-25 DIAGNOSIS — J40 Bronchitis, not specified as acute or chronic: Secondary | ICD-10-CM | POA: Diagnosis not present

## 2015-03-25 DIAGNOSIS — J0100 Acute maxillary sinusitis, unspecified: Secondary | ICD-10-CM | POA: Diagnosis not present

## 2015-03-25 NOTE — Telephone Encounter (Signed)
Pt wife called, states pt is to start Levoquin. Please call.

## 2015-03-25 NOTE — Telephone Encounter (Signed)
Spoke with pt.  He will start the Levaquin tomorrow.  He has an appt in [place] office on 12/21 to check INR.  Asked him to make sure he ate his greens and continue his normal dose until follow up.

## 2015-03-30 ENCOUNTER — Ambulatory Visit (INDEPENDENT_AMBULATORY_CARE_PROVIDER_SITE_OTHER): Payer: Medicare Other

## 2015-03-30 DIAGNOSIS — I4892 Unspecified atrial flutter: Secondary | ICD-10-CM

## 2015-03-30 DIAGNOSIS — Z7901 Long term (current) use of anticoagulants: Secondary | ICD-10-CM | POA: Diagnosis not present

## 2015-03-30 LAB — POCT INR: INR: 2.3

## 2015-03-31 ENCOUNTER — Telehealth: Payer: Self-pay | Admitting: Cardiology

## 2015-03-31 LAB — CUP PACEART INCLINIC DEVICE CHECK
Battery Voltage: 2.62 V
Brady Statistic AP VP Percent: 0 %
Brady Statistic AP VS Percent: 0 %
Brady Statistic AS VP Percent: 92.35 %
Brady Statistic AS VS Percent: 7.65 %
Brady Statistic RA Percent Paced: 0 %
Brady Statistic RV Percent Paced: 92.35 %
Date Time Interrogation Session: 20161129160017
HighPow Impedance: 342 Ohm
HighPow Impedance: 43 Ohm
HighPow Impedance: 55 Ohm
Implantable Lead Implant Date: 20070525
Implantable Lead Implant Date: 20070525
Implantable Lead Implant Date: 20070525
Implantable Lead Location: 753858
Implantable Lead Location: 753859
Implantable Lead Location: 753860
Implantable Lead Model: 4194
Implantable Lead Model: 5076
Implantable Lead Model: 6949
Lead Channel Impedance Value: 209 Ohm
Lead Channel Impedance Value: 285 Ohm
Lead Channel Impedance Value: 418 Ohm
Lead Channel Impedance Value: 475 Ohm
Lead Channel Impedance Value: 475 Ohm
Lead Channel Pacing Threshold Amplitude: 0.5 V
Lead Channel Pacing Threshold Amplitude: 0.75 V
Lead Channel Pacing Threshold Amplitude: 1 V
Lead Channel Pacing Threshold Pulse Width: 0.4 ms
Lead Channel Pacing Threshold Pulse Width: 0.4 ms
Lead Channel Pacing Threshold Pulse Width: 0.4 ms
Lead Channel Sensing Intrinsic Amplitude: 17.375 mV
Lead Channel Sensing Intrinsic Amplitude: 4.625 mV
Lead Channel Setting Pacing Amplitude: 2 V
Lead Channel Setting Pacing Amplitude: 2.5 V
Lead Channel Setting Pacing Pulse Width: 0.4 ms
Lead Channel Setting Pacing Pulse Width: 0.4 ms
Lead Channel Setting Sensing Sensitivity: 0.45 mV

## 2015-03-31 NOTE — Telephone Encounter (Signed)
Spoke w/ pt and informed him that his device has reached RRT. Informed him that the alert tone will go off for 16 days at the same time and if it bothers him he can come into the office and have the alert turned off. Informed pt that a scheduler will be contacting him to schedule an appt w/ MD / PA / NP. Pt verbalized understanding.

## 2015-04-06 ENCOUNTER — Other Ambulatory Visit: Payer: Self-pay | Admitting: Family Medicine

## 2015-04-06 ENCOUNTER — Encounter (INDEPENDENT_AMBULATORY_CARE_PROVIDER_SITE_OTHER): Payer: Medicare Other

## 2015-04-06 DIAGNOSIS — Z7901 Long term (current) use of anticoagulants: Secondary | ICD-10-CM | POA: Diagnosis not present

## 2015-04-06 DIAGNOSIS — I5022 Chronic systolic (congestive) heart failure: Secondary | ICD-10-CM

## 2015-04-06 DIAGNOSIS — R739 Hyperglycemia, unspecified: Secondary | ICD-10-CM

## 2015-04-06 DIAGNOSIS — I4892 Unspecified atrial flutter: Secondary | ICD-10-CM | POA: Diagnosis not present

## 2015-04-06 DIAGNOSIS — E538 Deficiency of other specified B group vitamins: Secondary | ICD-10-CM

## 2015-04-06 DIAGNOSIS — D696 Thrombocytopenia, unspecified: Secondary | ICD-10-CM

## 2015-04-06 DIAGNOSIS — E46 Unspecified protein-calorie malnutrition: Secondary | ICD-10-CM

## 2015-04-06 DIAGNOSIS — I429 Cardiomyopathy, unspecified: Secondary | ICD-10-CM

## 2015-04-07 ENCOUNTER — Other Ambulatory Visit (INDEPENDENT_AMBULATORY_CARE_PROVIDER_SITE_OTHER): Payer: Medicare Other

## 2015-04-07 DIAGNOSIS — E46 Unspecified protein-calorie malnutrition: Secondary | ICD-10-CM

## 2015-04-07 DIAGNOSIS — I429 Cardiomyopathy, unspecified: Secondary | ICD-10-CM

## 2015-04-07 DIAGNOSIS — D696 Thrombocytopenia, unspecified: Secondary | ICD-10-CM | POA: Diagnosis not present

## 2015-04-07 DIAGNOSIS — I5022 Chronic systolic (congestive) heart failure: Secondary | ICD-10-CM

## 2015-04-07 DIAGNOSIS — E538 Deficiency of other specified B group vitamins: Secondary | ICD-10-CM | POA: Diagnosis not present

## 2015-04-07 LAB — COMPREHENSIVE METABOLIC PANEL
ALT: 10 U/L (ref 0–53)
AST: 15 U/L (ref 0–37)
Albumin: 3.9 g/dL (ref 3.5–5.2)
Alkaline Phosphatase: 63 U/L (ref 39–117)
BUN: 18 mg/dL (ref 6–23)
CO2: 29 mEq/L (ref 19–32)
Calcium: 9.3 mg/dL (ref 8.4–10.5)
Chloride: 102 mEq/L (ref 96–112)
Creatinine, Ser: 1.06 mg/dL (ref 0.40–1.50)
GFR: 71.17 mL/min (ref >60.00)
Glucose, Bld: 95 mg/dL (ref 70–99)
Potassium: 4.6 mEq/L (ref 3.5–5.1)
Sodium: 137 mEq/L (ref 135–145)
Total Bilirubin: 0.6 mg/dL (ref 0.2–1.2)
Total Protein: 6.6 g/dL (ref 6.0–8.3)

## 2015-04-07 LAB — CBC WITH DIFFERENTIAL/PLATELET
Basophils Absolute: 0.1 10*3/uL (ref 0.0–0.1)
Basophils Relative: 1.1 % (ref 0.0–3.0)
Eosinophils Absolute: 0.2 10*3/uL (ref 0.0–0.7)
Eosinophils Relative: 3.5 % (ref 0.0–5.0)
HCT: 40.4 % (ref 39.0–52.0)
Hemoglobin: 13.4 g/dL (ref 13.0–17.0)
Lymphocytes Relative: 23.1 % (ref 12.0–46.0)
Lymphs Abs: 1.3 10*3/uL (ref 0.7–4.0)
MCHC: 33.2 g/dL (ref 30.0–36.0)
MCV: 86.9 fl (ref 78.0–100.0)
Monocytes Absolute: 0.5 10*3/uL (ref 0.1–1.0)
Monocytes Relative: 9.5 % (ref 3.0–12.0)
Neutro Abs: 3.6 10*3/uL (ref 1.4–7.7)
Neutrophils Relative %: 62.8 % (ref 43.0–77.0)
Platelets: 141 10*3/uL — ABNORMAL LOW (ref 150.0–400.0)
RBC: 4.65 Mil/uL (ref 4.22–5.81)
RDW: 15.9 % — ABNORMAL HIGH (ref 11.5–15.5)
WBC: 5.7 10*3/uL (ref 4.0–10.5)

## 2015-04-07 LAB — TSH: TSH: 2.18 u[IU]/mL (ref 0.35–4.50)

## 2015-04-07 LAB — VITAMIN B12: Vitamin B-12: 457 pg/mL (ref 211–911)

## 2015-04-08 ENCOUNTER — Encounter: Payer: Self-pay | Admitting: *Deleted

## 2015-04-08 ENCOUNTER — Ambulatory Visit (INDEPENDENT_AMBULATORY_CARE_PROVIDER_SITE_OTHER): Payer: Medicare Other | Admitting: Physician Assistant

## 2015-04-08 ENCOUNTER — Encounter: Payer: Self-pay | Admitting: Internal Medicine

## 2015-04-08 ENCOUNTER — Encounter: Payer: Self-pay | Admitting: Physician Assistant

## 2015-04-08 VITALS — BP 118/64 | HR 77 | Ht 66.0 in | Wt 141.0 lb

## 2015-04-08 DIAGNOSIS — I4892 Unspecified atrial flutter: Secondary | ICD-10-CM

## 2015-04-08 DIAGNOSIS — I429 Cardiomyopathy, unspecified: Secondary | ICD-10-CM | POA: Diagnosis not present

## 2015-04-08 DIAGNOSIS — I5022 Chronic systolic (congestive) heart failure: Secondary | ICD-10-CM | POA: Diagnosis not present

## 2015-04-08 DIAGNOSIS — I420 Dilated cardiomyopathy: Secondary | ICD-10-CM

## 2015-04-08 NOTE — Progress Notes (Signed)
Cardiology Office Note Date:  04/08/2015  Patient ID:  [name] 06/04/1931, MRN [id number] PCP:  Ria Bush, MD  Electrophysiologist: Dr. Lovena Le    Chief Complaint: device has reached ERI  History of Present Illness: [name] is a 81 y.o. male with history of NICM, AFlutter/CAFib (failed Tikosyn with ongoing PAFlutter and ERAF post DCCV), on coumadin managed with the [facility] coumadin clinic, LBBB, COPD/pulmonary nodules follows with [facility] pulmonologist though reports this as a resolved issue and felt to have been infectious, pulm HTN on his last echo, CHB with and ICD in place.  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  He denies any CP, palpitations, no dizziness, near syncope or syncope.  He comes today accompanied by his wife and 2 of his children.  He is taking Levaquin currently for a sinus infection by his PMD, but feeling well.  Past Medical History  Diagnosis Date  . NICM (nonischemic cardiomyopathy) [facility])     Cardiac catheterization March 2006 without coronary disease; echocardiogram August 2008: EF 35%, mild AI, left atrial enlargement  . Atrial flutter ([place])     A.  Status post cardioversion; B.  Tikosyn therapy - failed, remains in aflutter  . COPD (chronic obstructive pulmonary disease) ([place])     spirometry 2015 - no obstruction, + mild restrictive lung disease  . LBBB (left bundle branch block)   . GERD (gastroesophageal reflux disease) 2010    h/o esophageal stricture with dilation, LA grade C reflux esophagitis by EGD 2010  . Porphyria ([place])   . IBS (irritable bowel syndrome)   . BPH (benign prostatic hyperplasia)   . History of diverticulitis of colon   . Allergic rhinitis     [name]  . Chronic systolic congestive heart failure ([place])   . History of GI bleed     Secondary to hemorrhoids  . Extrinsic asthma     [name]   . 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 Lucky Cowboy)  . Multiple pulmonary nodules 06/2013    RUL ([facility] Adam Phenix [facility]) - ?vasculitis as of last CT at [facility]  . Chronic sinusitis   . Goiter   . Bilateral pneumonia 11/02/2013    treated with levaquin    Past Surgical History  Procedure Laterality Date  . Penile prosthesis implant  2007    Otelin  . Cholecystectomy    . Goiter removal    . Nose surgery  1971  . Hernia repair    . Cardiac catheterization  06/22/04    Severe, nonischemic cardiomyopathy,EF 25-30%  . Biv-aicd implant      [employer]  . Colonoscopy  10/99    Divertic, splenic, hepatic fleure only  . Esophagogastroduodenoscopy  01/1998    stricture, sliding HH, GERD  . Esophageal dilation  02/18/98  . Esophagogastroduodenoscopy  04/08/01    stricture, gastritis, HH, GERD-no dilation(Dr. Henrene Pastor)  . Hernia repair  01/30/02    Dr. Hassell Done  . Pulmonary eval  04/2002    ([facility]) Chronic congestive symptoms  . US echocardiography  07/13/04    EF 25-30%, Mod LVH; LA severe dilation; Mild AR; IRTR  . Esophagogastroduodenoscopy  12/22/04    stricture; gastritis; duodenitis, GERD  . US echocardiography  11/27/06    hypokinesis posterior wall, EF 35%; mild AR  . Esophagogastroduodenoscopy  10/21/08    Reflux  esophagitis; Erythem. Duod. (Dr. Vira Agar)  . Colonoscopy  10/21/08    aborted-divertics, int hemms (Dr. Vira Agar)  . Cardioversion  02/22/09    AFlutter-([facility])  . Coronary angioplasty    . Back surgery  1997    bulging disks  . Esophagogastroduodenoscopy  08/2013    erythema gastric fundus - minimal gastritis, HH ([facility])  . Pft  10/2013    FVC 61%, FEV1 63%, ratio 0.76    Current Outpatient Prescriptions  Medication Sig Dispense Refill  . acetaminophen (TYLENOL) 500 MG tablet Take 250 mg by mouth daily as needed. Pain    . ALPRAZolam (XANAX) 0.25 MG tablet Take 1 tablet (0.25 mg total) by mouth at bedtime. 60 tablet 0  . amoxicillin (AMOXIL)  875 MG tablet Take 1 tablet (875 mg total) by mouth 2 (two) times daily. 14 tablet 0  . carvedilol (COREG) 6.25 MG tablet TAKE 1 TABLET (6.25 MG TOTAL) BY MOUTH 2 (TWO) TIMES DAILY WITH MEALS 180 tablet 1  . cyanocobalamin (,VITAMIN B-12,) 1000 MCG/ML injection Inject 1 mL (1,000 mcg total) into the muscle every 30 (thirty) days. Started 03/2014    . docusate sodium (COLACE) 100 MG capsule Take 1 capsule (100 mg total) by mouth daily. (Patient taking differently: Take 100 mg by mouth daily as needed for mild constipation. )    . enalapril (VASOTEC) 5 MG tablet Take 1 tablet (5 mg total) by mouth 2 (two) times daily. 20 tablet 0  . flunisolide (NASAREL) 29 MCG/ACT (0.025%) nasal spray 2 sprays by Nasal route daily. Dose is for each nostril.     . furosemide (LASIX) 20 MG tablet Take 0.5 tablets (10 mg total) by mouth daily. 45 tablet 1  . levalbuterol (XOPENEX HFA) 45 MCG/ACT inhaler Inhale 1-2 puffs into the lungs every 6 (six) hours as needed for wheezing or shortness of breath. 1 Inhaler 1  . levofloxacin (LEVAQUIN) 750 MG tablet Take 750 mg by mouth daily.    . Loratadine 10 MG CAPS Take 1 capsule by mouth daily as needed (allergies).     . montelukast (SINGULAIR) 10 MG tablet Take 10 mg by mouth daily as needed (BREATHING).     Marland Kitchen ondansetron (ZOFRAN) 4 MG tablet Take 1 tablet (4 mg total) by mouth every 8 (eight) hours as needed for nausea or vomiting. 10 tablet 0  . sucralfate (CARAFATE) 1 G tablet TAKE 1 TABLET BY MOUTH AS DAILY NEEDED for stomach issues    . warfarin (COUMADIN) 5 MG tablet TAKE AS DIRECTED  BY  ANTICOAGULATION  CLINIC 135 tablet 1   No current facility-administered medications for this visit.    Allergies:   Clarithromycin; Famotidine; Omeprazole; Oxytetracycline; Penicillins; Proton pump inhibitors; Ranitidine; and Doxycycline   Social History:  The patient  reports that he has never smoked. He has never used smokeless tobacco. He reports that he does not drink alcohol or  use illicit drugs.   Family History:  The patient's family history includes Alcohol abuse in his maternal uncle; Coronary artery disease in his father; Dysphagia in his sister; Heart failure in his mother; Hypertension in his father; Other in his sister, sister, and sister.  ROS:  Please see the history of present illness.  All other systems are reviewed and otherwise negative.   PHYSICAL EXAM:  VS:  BP 118/64 mmHg  Pulse 77  Ht 5\' 6"  (1.676 m)  Wt 141 lb (63.957 kg)  BMI 22.77 kg/m2 BMI: Body mass index is 22.77 kg/(m^2). Well nourished, well  developed, in no acute distress HEENT: normocephalic, atraumatic Neck: no JVD, carotid bruits or masses Cardiac:  normal S1, S2; RRR; no significant murmurs, no rubs, or gallops Lungs:  clear to auscultation bilaterally, no wheezing, rhonchi or rales Abd: soft, nontender MS: no deformity or atrophy Ext: no edema Skin: warm and dry, no rash Neuro:  No gross deficits appreciated Psych: euthymic mood, full affect  ICD site is stable, no tethering or discomfort   EKG:  Done today shows AFib, V paced ICD check today shows his device tripped to ERI 03/31/15, normal device function, appears chronically in AF  09/22/14: Echocardiogram Study Conclusions - Left ventricle: The cavity size was mildly dilated. Wall thickness was normal. Systolic function was severely reduced. The estimated ejection fraction was in the range of 20% to 25%. Diffuse hypokinesis. Doppler parameters are consistent with restrictive physiology, indicative of decreased left ventricular diastolic compliance and/or increased left atrial pressure. - Aortic valve: There was mild regurgitation. - Mitral valve: There was mild regurgitation. - Left atrium: The atrium was severely dilated. - Right atrium: The atrium was moderately dilated. - Pulmonary arteries: Systolic pressure was moderately to severely increased. PA peak pressure: 60 mm Hg (S).   Recent  Labs: 04/07/2015: ALT 10; BUN 18; Creatinine, Ser 1.06; Hemoglobin 13.4; Platelets 141.0*; Potassium 4.6; Sodium 137; TSH 2.18  03/30/15: INR 2.3  Estimated Creatinine Clearance: 49.3 mL/min (by C-G formula based on Cr of 1.06).   Wt Readings from Last 3 Encounters:  04/08/15 141 lb (63.957 kg)  03/18/15 142 lb 8 oz (64.638 kg)  03/08/15 142 lb 9.6 oz (64.683 kg)     Other studies reviewed: Additional studies/records reviewed today include: summarized above  DEVICE information: MDT CRT ICD implanted 2012 by Dr. Lovena Le, original device 2007, noting RV lead MDT [phone] 07/24/10 OP note from ICD generator change: It should be noted that the patient did have a 6949 RV lead in, and the rate/sense portion of this lead was hooked up to the LV port, and the LV lead was placed in the rate/sense portion of the RV port. This was done to minimize lead problems secondary to the 6949 defibrillator lead.   ASSESSMENT AND PLAN:  1. CAFib, flutter History of failing Tikosyn and DCCV CHADS2Vasc is at least 3 on coumadin, monitored and managed by the coumadin clinic no bleeding or signs of bleeding reported by the patient  2. NICM with ICD, chronic CHF LBBB appears very well compensated On BB/ACE, diuretic ICD has reached ERI plan for generator change, hold coumadin pending labs/INF pre-procedure Discussion with the patient and family regarding the possibility of PPM rather then ICD as was brought up at his last visit with Dr. Lovena Le, they have discussed this at length at home, and we discussed again today, given his very active lifestyle and current quality of life he would like to continue with ICD.  Discussed risk/benefit of the procedure, possibility of new lead if any lead issue detected at the procedure time, and he would like to proceed.  Disposition: Complete his abx therapy for his sinusitis, we will plan for generator change in the next couple weeks, f/u with wound check post procedure, 3  months with Dr. Lovena Le, continue Q 3 month carelink remotes.  Current medicines are reviewed at length with the patient today.  The patient did not have any concerns regarding medicines.  Haywood Lasso, PA-C 04/08/2015 8:13 AM     [place]  [phone] (office)  [phone] (fax)

## 2015-04-08 NOTE — Patient Instructions (Addendum)
Medication Instructions:   CONTINUE SAME MEDICATIONS  If you need a refill on your cardiac medications before your next appointment, please call your pharmacy.   Labwork:  CBC BMET AND PT INR ON 04/20/14    Testing/Procedures:  SEE LETTER FOR ICD GEN CHANGE ON 04/25/15   Follow-Up:  10 DAY WOUND CHECK AFTER : 04/25/15  31 DAY FOLLOW UP AFTER:  04/25/15  PHYS DEFIB  CK WITH DR Lovena Le    Any Other Special Instructions Will Be Listed Below (If Applicable).

## 2015-04-11 ENCOUNTER — Other Ambulatory Visit: Payer: Self-pay | Admitting: Physician Assistant

## 2015-04-12 ENCOUNTER — Ambulatory Visit (INDEPENDENT_AMBULATORY_CARE_PROVIDER_SITE_OTHER): Payer: Medicare Other | Admitting: Family Medicine

## 2015-04-12 ENCOUNTER — Encounter: Payer: Self-pay | Admitting: Family Medicine

## 2015-04-12 ENCOUNTER — Other Ambulatory Visit: Payer: Self-pay | Admitting: Physician Assistant

## 2015-04-12 VITALS — BP 114/72 | HR 76 | Temp 97.8°F | Ht 66.25 in | Wt 141.0 lb

## 2015-04-12 DIAGNOSIS — K5900 Constipation, unspecified: Secondary | ICD-10-CM

## 2015-04-12 DIAGNOSIS — Z0000 Encounter for general adult medical examination without abnormal findings: Secondary | ICD-10-CM | POA: Diagnosis not present

## 2015-04-12 DIAGNOSIS — E538 Deficiency of other specified B group vitamins: Secondary | ICD-10-CM

## 2015-04-12 DIAGNOSIS — I5022 Chronic systolic (congestive) heart failure: Secondary | ICD-10-CM

## 2015-04-12 DIAGNOSIS — I429 Cardiomyopathy, unspecified: Secondary | ICD-10-CM

## 2015-04-12 DIAGNOSIS — Z7189 Other specified counseling: Secondary | ICD-10-CM

## 2015-04-12 DIAGNOSIS — I728 Aneurysm of other specified arteries: Secondary | ICD-10-CM

## 2015-04-12 DIAGNOSIS — I4892 Unspecified atrial flutter: Secondary | ICD-10-CM

## 2015-04-12 DIAGNOSIS — M75122 Complete rotator cuff tear or rupture of left shoulder, not specified as traumatic: Secondary | ICD-10-CM

## 2015-04-12 DIAGNOSIS — J019 Acute sinusitis, unspecified: Secondary | ICD-10-CM

## 2015-04-12 DIAGNOSIS — D696 Thrombocytopenia, unspecified: Secondary | ICD-10-CM

## 2015-04-12 MED ORDER — ONDANSETRON HCL 4 MG PO TABS: 4.0000 mg | ORAL_TABLET | Freq: Three times a day (TID) | ORAL | Status: DC | PRN

## 2015-04-12 NOTE — Progress Notes (Signed)
BP 114/72 mmHg  Pulse 76  Temp(Src) 97.8 F (36.6 C) (Oral)  Ht 5' 6.25" (1.683 m)  Wt 141 lb (63.957 kg)  BMI 22.58 kg/m2   CC: medicare wellness visit  Subjective:    Patient ID: [name], male    DOB: 07-14-1931, 82 y.o.   MRN: [id number]  HPI: [name] is a 82 y.o. male presenting on 04/12/2015 for Annual Exam   Pending ICD/pacemaker replacement 04/25/15.  Bilateral shoulder pain with RTC tears. Sees Dr Marry Guan.  Currently on antibiotic by [facility] MD for nasal congestion - treated with 21d levaquin.   Takes miralax PRN.  Some trouble at higher frequencies on hearing screen - declines audiology referral. Passes vision screen Denies falls in last year, denies depression/anhedonia.  Preventative: Colonoscopy - 2010 Tiffany Kocher) aborted early 2/2 significant diverticulosis. Prostate cancer - aged out Flu shot - declines  Prevnar - will consider Td 2011  zostavax 2008  Advanced directive: Wouldn't want prolonged life support. Wife would be medical decision maker. Has defibrillator in place. On [place] registry card - file # [id number].  Seat belt use discussed Sunscreen use discussed. No changing moles on skin.   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 Jefm Bryant Activity: No regular exercise Diet: good water, fruits/vegetables daily  Relevant past medical, surgical, family and social history reviewed and updated as indicated. Interim medical history since our last visit reviewed. Allergies and medications reviewed and updated. Current Outpatient Prescriptions on File Prior to Visit  Medication Sig  . acetaminophen (TYLENOL) 500 MG tablet Take 250 mg by mouth daily as needed. Pain  . ALPRAZolam (XANAX) 0.25 MG tablet Take 1 tablet (0.25 mg total) by mouth at bedtime.  Marland Kitchen alum & mag hydroxide-simeth (MAALOX/MYLANTA) 200-200-20 MG/5ML  suspension Take 15 mLs by mouth daily as needed for indigestion or heartburn.  . carvedilol (COREG) 6.25 MG tablet TAKE 1 TABLET (6.25 MG TOTAL) BY MOUTH 2 (TWO) TIMES DAILY WITH MEALS  . cyanocobalamin (,VITAMIN B-12,) 1000 MCG/ML injection Inject 1 mL (1,000 mcg total) into the muscle every 30 (thirty) days. Started 03/2014  . enalapril (VASOTEC) 5 MG tablet Take 1 tablet (5 mg total) by mouth 2 (two) times daily.  . flunisolide (NASAREL) 29 MCG/ACT (0.025%) nasal spray 2 sprays by Nasal route daily. Dose is for each nostril.   . furosemide (LASIX) 20 MG tablet Take 0.5 tablets (10 mg total) by mouth daily.  Marland Kitchen levalbuterol (XOPENEX HFA) 45 MCG/ACT inhaler Inhale 1-2 puffs into the lungs every 6 (six) hours as needed for wheezing or shortness of breath.  . levofloxacin (LEVAQUIN) 750 MG tablet Take 750 mg by mouth daily.  . Loratadine 10 MG CAPS Take 1 capsule by mouth daily as needed (allergies).   . montelukast (SINGULAIR) 10 MG tablet Take 10 mg by mouth daily as needed (BREATHING).   Marland Kitchen sucralfate (CARAFATE) 1 G tablet TAKE 1 TABLET BY MOUTH AS DAILY NEEDED for stomach issues  . warfarin (COUMADIN) 5 MG tablet TAKE AS DIRECTED  BY  ANTICOAGULATION  CLINIC   No current facility-administered medications on file prior to visit.    Review of Systems Per HPI unless specifically indicated in ROS section     Objective:    BP 114/72 mmHg  Pulse 76  Temp(Src) 97.8 F (36.6 C) (Oral)  Ht 5' 6.25" (1.683 m)  Wt 141 lb (63.957 kg)  BMI 22.58 kg/m2  Wt Readings from Last 3 Encounters:  04/12/15 141 lb (63.957 kg)  04/08/15 141 lb (63.957 kg)  03/18/15 142 lb 8 oz (64.638 kg)    Physical Exam  Constitutional: He is oriented to person, place, and time. He appears well-developed and well-nourished. No distress.  HENT:  Head: Normocephalic and atraumatic.  Right Ear: Hearing, tympanic membrane, external ear and ear canal normal.  Left Ear: Hearing, tympanic membrane, external ear and ear  canal normal.  Nose: Nose normal.  Mouth/Throat: Uvula is midline, oropharynx is clear and moist and mucous membranes are normal. No oropharyngeal exudate, posterior oropharyngeal edema or posterior oropharyngeal erythema.  Eyes: Conjunctivae and EOM are normal. Pupils are equal, round, and reactive to light. No scleral icterus.  Neck: Normal range of motion. Neck supple. Carotid bruit is not present. No thyromegaly present.  Cardiovascular: Normal rate, regular rhythm, normal heart sounds and intact distal pulses.   No murmur heard. Pulses:      Radial pulses are 2+ on the right side, and 2+ on the left side.  Pulmonary/Chest: Effort normal and breath sounds normal. No respiratory distress. He has no wheezes. He has no rales.  Abdominal: Soft. Bowel sounds are normal. He exhibits no distension and no mass. There is no tenderness. There is no rebound and no guarding.  Musculoskeletal: Normal range of motion. He exhibits no edema.  Lymphadenopathy:    He has no cervical adenopathy.  Neurological: He is alert and oriented to person, place, and time.  CN grossly intact, station and gait intact Recall 2/3, 2/3 with cue Calculation 4/5 serial 3s  Skin: Skin is warm and dry. No rash noted.  Psychiatric: He has a normal mood and affect. His behavior is normal. Judgment and thought content normal.  Nursing note and vitals reviewed.  Results for orders placed or performed in visit on 04/07/15  Comprehensive metabolic panel  Result Value Ref Range   Sodium 137 135 - 145 mEq/L   Potassium 4.6 3.5 - 5.1 mEq/L   Chloride 102 96 - 112 mEq/L   CO2 29 19 - 32 mEq/L   Glucose, Bld 95 70 - 99 mg/dL   BUN 18 6 - 23 mg/dL   Creatinine, Ser 1.06 0.40 - 1.50 mg/dL   Total Bilirubin 0.6 0.2 - 1.2 mg/dL   Alkaline Phosphatase 63 39 - 117 U/L   AST 15 0 - 37 U/L   ALT 10 0 - 53 U/L   Total Protein 6.6 6.0 - 8.3 g/dL   Albumin 3.9 3.5 - 5.2 g/dL   Calcium 9.3 8.4 - 10.5 mg/dL   GFR 71.17 >60.00 mL/min    CBC with Differential/Platelet  Result Value Ref Range   WBC 5.7 4.0 - 10.5 K/uL   RBC 4.65 4.22 - 5.81 Mil/uL   Hemoglobin 13.4 13.0 - 17.0 g/dL   HCT 40.4 39.0 - 52.0 %   MCV 86.9 78.0 - 100.0 fl   MCHC 33.2 30.0 - 36.0 g/dL   RDW 15.9 (H) 11.5 - 15.5 %   Platelets 141.0 (L) 150.0 - 400.0 K/uL   Neutrophils Relative % 62.8 43.0 - 77.0 %   Lymphocytes Relative 23.1 12.0 - 46.0 %   Monocytes Relative 9.5 3.0 - 12.0 %   Eosinophils Relative 3.5 0.0 - 5.0 %   Basophils Relative 1.1 0.0 - 3.0 %   Neutro Abs 3.6 1.4 - 7.7 K/uL   Lymphs Abs 1.3 0.7 - 4.0 K/uL   Monocytes Absolute 0.5 0.1 - 1.0  K/uL   Eosinophils Absolute 0.2 0.0 - 0.7 K/uL   Basophils Absolute 0.1 0.0 - 0.1 K/uL  Vitamin B12  Result Value Ref Range   Vitamin B-12 457 211 - 911 pg/mL  TSH  Result Value Ref Range   TSH 2.18 0.35 - 4.50 uIU/mL      Assessment & Plan:   Problem List Items Addressed This Visit    Thrombocytopenia ([place])    Stable. ?ITP       Secondary cardiomyopathy ([place])    Stable period, followed regularly by cardiology.      Medicare annual wellness visit, subsequent - Primary    I have personally reviewed the Medicare Annual Wellness questionnaire and have noted 1. The patient's medical and social history 2. Their use of alcohol, tobacco or illicit drugs 3. Their current medications and supplements 4. The patient's functional ability including ADL's, fall risks, home safety risks and hearing or visual impairment. Cognitive function has been assessed and addressed as indicated.  5. Diet and physical activity 6. Evidence for depression or mood disorders The patients weight, height, BMI have been recorded in the chart. I have made referrals, counseling and provided education to the patient based on review of the above and I have provided the pt with a written personalized care plan for preventive services. Provider list updated.. See scanned questionairre as needed for further  documentation. Reviewed preventative protocols and updated unless pt declined.       Constipation    Controlled with diet and PRN miralax.      Complete tear of left rotator cuff    Has f/u with ortho.      Chronic systolic heart failure (HCC)    Stable. Continue medical management.      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       B12 deficiency    Continue monthly b12 IM x 6 more months then transition to oral.      Atrial flutter ([place])    Rate controlled      Advanced care planning/counseling discussion    Advanced directive: Wouldn't want prolonged life support. Wife would be medical decision maker. Has defibrillator in place. On [place] registry card - file # [id number].       Acute sinusitis    Treated by [facility] with 21d levaquin course, finishing.          Follow up plan: Return in about 1 year (around 04/11/2016), or as needed, for medicare wellness visit.

## 2015-04-12 NOTE — Assessment & Plan Note (Signed)

## 2015-04-12 NOTE — Assessment & Plan Note (Signed)
Rate controlled 

## 2015-04-12 NOTE — Assessment & Plan Note (Signed)
Controlled with diet and PRN miralax.

## 2015-04-12 NOTE — Assessment & Plan Note (Signed)
Stable. ?ITP

## 2015-04-12 NOTE — Assessment & Plan Note (Signed)
Treated by [facility] with 21d levaquin course, finishing.

## 2015-04-12 NOTE — Patient Instructions (Addendum)
Consider prevnar when you finish antibiotics. You are doing well today. Come back as needed or in 1 year for next wellness visit  Health Maintenance, Male A healthy lifestyle and preventative care can promote health and wellness.  Maintain regular health, dental, and eye exams.  Eat a healthy diet. Foods like vegetables, fruits, whole grains, low-fat dairy products, and lean protein foods contain the nutrients you need and are low in calories. Decrease your intake of foods high in solid fats, added sugars, and salt. Get information about a proper diet from your health care provider, if necessary.  Regular physical exercise is one of the most important things you can do for your health. Most adults should get at least 150 minutes of moderate-intensity exercise (any activity that increases your heart rate and causes you to sweat) each week. In addition, most adults need muscle-strengthening exercises on 2 or more days a week.   Maintain a healthy weight. The body mass index (BMI) is a screening tool to identify possible weight problems. It provides an estimate of body fat based on height and weight. Your health care provider can find your BMI and can help you achieve or maintain a healthy weight. For males 20 years and older:  A BMI below 18.5 is considered underweight.  A BMI of 18.5 to 24.9 is normal.  A BMI of 25 to 29.9 is considered overweight.  A BMI of 30 and above is considered obese.  Maintain normal blood lipids and cholesterol by exercising and minimizing your intake of saturated fat. Eat a balanced diet with plenty of fruits and vegetables. Blood tests for lipids and cholesterol should begin at age 60 and be repeated every 5 years. If your lipid or cholesterol levels are high, you are over age 13, or you are at high risk for heart disease, you may need your cholesterol levels checked more frequently.Ongoing high lipid and cholesterol levels should be treated with medicines if diet  and exercise are not working.  If you smoke, find out from your health care provider how to quit. If you do not use tobacco, do not start.  Lung cancer screening is recommended for adults aged 3-80 years who are at high risk for developing lung cancer because of a history of smoking. A yearly low-dose CT scan of the lungs is recommended for people who have at least a 30-pack-year history of smoking and are current smokers or have quit within the past 15 years. A pack year of smoking is smoking an average of 1 pack of cigarettes a day for 1 year (for example, a 30-pack-year history of smoking could mean smoking 1 pack a day for 30 years or 2 packs a day for 15 years). Yearly screening should continue until the smoker has stopped smoking for at least 15 years. Yearly screening should be stopped for people who develop a health problem that would prevent them from having lung cancer treatment.  If you choose to drink alcohol, do not have more than 2 drinks per day. One drink is considered to be 12 oz (360 mL) of beer, 5 oz (150 mL) of wine, or 1.5 oz (45 mL) of liquor.  Avoid the use of street drugs. Do not share needles with anyone. Ask for help if you need support or instructions about stopping the use of drugs.  High blood pressure causes heart disease and increases the risk of stroke. High blood pressure is more likely to develop in:  People who have blood pressure  in the end of the normal range (100-139/85-89 mm Hg).  People who are overweight or obese.  People who are African American.  If you are 55-74 years of age, have your blood pressure checked every 3-5 years. If you are 79 years of age or older, have your blood pressure checked every year. You should have your blood pressure measured twice--once when you are at a hospital or clinic, and once when you are not at a hospital or clinic. Record the average of the two measurements. To check your blood pressure when you are not at a hospital or  clinic, you can use:  An automated blood pressure machine at a pharmacy.  A home blood pressure monitor.  If you are 43-52 years old, ask your health care provider if you should take aspirin to prevent heart disease.  Diabetes screening involves taking a blood sample to check your fasting blood sugar level. This should be done once every 3 years after age 73 if you are at a normal weight and without risk factors for diabetes. Testing should be considered at a younger age or be carried out more frequently if you are overweight and have at least 1 risk factor for diabetes.  Colorectal cancer can be detected and often prevented. Most routine colorectal cancer screening begins at the age of 54 and continues through age 83. However, your health care provider may recommend screening at an earlier age if you have risk factors for colon cancer. On a yearly basis, your health care provider may provide home test kits to check for hidden blood in the stool. A small camera at the end of a tube may be used to directly examine the colon (sigmoidoscopy or colonoscopy) to detect the earliest forms of colorectal cancer. Talk to your health care provider about this at age 64 when routine screening begins. A direct exam of the colon should be repeated every 5-10 years through age 53, unless early forms of precancerous polyps or small growths are found.  People who are at an increased risk for hepatitis B should be screened for this virus. You are considered at high risk for hepatitis B if:  You were born in a country where hepatitis B occurs often. Talk with your health care provider about which countries are considered high risk.  Your parents were born in a high-risk country and you have not received a shot to protect against hepatitis B (hepatitis B vaccine).  You have HIV or AIDS.  You use needles to inject street drugs.  You live with, or have sex with, someone who has hepatitis B.  You are a man who has  sex with other men (MSM).  You get hemodialysis treatment.  You take certain medicines for conditions like cancer, organ transplantation, and autoimmune conditions.  Hepatitis C blood testing is recommended for all people born from [birth year] through 1963 and any individual with known risk factors for hepatitis C.  Healthy men should no longer receive prostate-specific antigen (PSA) blood tests as part of routine cancer screening. Talk to your health care provider about prostate cancer screening.  Testicular cancer screening is not recommended for adolescents or adult males who have no symptoms. Screening includes self-exam, a health care provider exam, and other screening tests. Consult with your health care provider about any symptoms you have or any concerns you have about testicular cancer.  Practice safe sex. Use condoms and avoid high-risk sexual practices to reduce the spread of sexually transmitted infections (STIs).  You should be screened for STIs, including gonorrhea and chlamydia if:  You are sexually active and are younger than 24 years.  You are older than 24 years, and your health care provider tells you that you are at risk for this type of infection.  Your sexual activity has changed since you were last screened, and you are at an increased risk for chlamydia or gonorrhea. Ask your health care provider if you are at risk.  If you are at risk of being infected with HIV, it is recommended that you take a prescription medicine daily to prevent HIV infection. This is called pre-exposure prophylaxis (PrEP). You are considered at risk if:  You are a man who has sex with other men (MSM).  You are a heterosexual man who is sexually active with multiple partners.  You take drugs by injection.  You are sexually active with a partner who has HIV.  Talk with your health care provider about whether you are at high risk of being infected with HIV. If you choose to begin PrEP, you should  first be tested for HIV. You should then be tested every 3 months for as long as you are taking PrEP.  Use sunscreen. Apply sunscreen liberally and repeatedly throughout the day. You should seek shade when your shadow is shorter than you. Protect yourself by wearing long sleeves, pants, a wide-brimmed hat, and sunglasses year round whenever you are outdoors.  Tell your health care provider of new moles or changes in moles, especially if there is a change in shape or color. Also, tell your health care provider if a mole is larger than the size of a pencil eraser.  A one-time screening for abdominal aortic aneurysm (AAA) and surgical repair of large AAAs by ultrasound is recommended for men aged 41-75 years who are current or former smokers.  Stay current with your vaccines (immunizations).   This information is not intended to replace advice given to you by your health care provider. Make sure you discuss any questions you have with your health care provider.   Document Released: 09/22/2007 Document Revised: 04/16/2014 Document Reviewed: 08/21/2010 Elsevier Interactive Patient Education [employer].

## 2015-04-12 NOTE — Assessment & Plan Note (Signed)
Advanced directive: Wouldn't want prolonged life support. Wife would be medical decision maker. Has defibrillator in place. On [place] registry card - file # [id number].

## 2015-04-12 NOTE — Assessment & Plan Note (Addendum)
Continue monthly b12 IM x 6 more months then transition to oral.

## 2015-04-12 NOTE — Assessment & Plan Note (Signed)
Stable. Continue medical management.

## 2015-04-12 NOTE — Assessment & Plan Note (Signed)
Stable period, followed regularly by cardiology.

## 2015-04-12 NOTE — Assessment & Plan Note (Signed)
Has f/u with ortho.

## 2015-04-12 NOTE — Assessment & Plan Note (Signed)
Unsure if recently imaged.

## 2015-04-12 NOTE — Progress Notes (Signed)
Pre visit review using our clinic review tool, if applicable. No additional management support is needed unless otherwise documented below in the visit note. 

## 2015-04-13 ENCOUNTER — Ambulatory Visit (INDEPENDENT_AMBULATORY_CARE_PROVIDER_SITE_OTHER): Payer: Medicare Other

## 2015-04-13 DIAGNOSIS — I4892 Unspecified atrial flutter: Secondary | ICD-10-CM | POA: Diagnosis not present

## 2015-04-13 LAB — POCT INR: INR: 1.7

## 2015-04-14 LAB — CUP PACEART INCLINIC DEVICE CHECK
Date Time Interrogation Session: 20170105142141
Implantable Lead Implant Date: 20070525
Implantable Lead Implant Date: 20070525
Implantable Lead Implant Date: 20070525
Implantable Lead Location: 753858
Implantable Lead Location: 753859
Implantable Lead Location: 753860
Implantable Lead Model: 4194
Implantable Lead Model: 5076
Implantable Lead Model: 6949
Lead Channel Setting Pacing Amplitude: 2 V
Lead Channel Setting Pacing Amplitude: 2.5 V
Lead Channel Setting Pacing Pulse Width: 0.4 ms
Lead Channel Setting Pacing Pulse Width: 0.4 ms
Lead Channel Setting Sensing Sensitivity: 0.45 mV

## 2015-04-19 DIAGNOSIS — M25511 Pain in right shoulder: Secondary | ICD-10-CM | POA: Diagnosis not present

## 2015-04-19 DIAGNOSIS — M25512 Pain in left shoulder: Secondary | ICD-10-CM | POA: Diagnosis not present

## 2015-04-19 DIAGNOSIS — M7541 Impingement syndrome of right shoulder: Secondary | ICD-10-CM | POA: Diagnosis not present

## 2015-04-21 ENCOUNTER — Other Ambulatory Visit: Payer: Self-pay | Admitting: Cardiology

## 2015-04-21 ENCOUNTER — Other Ambulatory Visit (INDEPENDENT_AMBULATORY_CARE_PROVIDER_SITE_OTHER): Payer: Medicare Other | Admitting: *Deleted

## 2015-04-21 ENCOUNTER — Ambulatory Visit (INDEPENDENT_AMBULATORY_CARE_PROVIDER_SITE_OTHER): Payer: Medicare Other

## 2015-04-21 DIAGNOSIS — Z7901 Long term (current) use of anticoagulants: Secondary | ICD-10-CM | POA: Diagnosis not present

## 2015-04-21 DIAGNOSIS — I5022 Chronic systolic (congestive) heart failure: Secondary | ICD-10-CM

## 2015-04-21 DIAGNOSIS — I4892 Unspecified atrial flutter: Secondary | ICD-10-CM | POA: Diagnosis not present

## 2015-04-21 LAB — BASIC METABOLIC PANEL
BUN: 28 mg/dL — ABNORMAL HIGH (ref 7–25)
CO2: 27 mmol/L (ref 20–31)
Calcium: 8.9 mg/dL (ref 8.6–10.3)
Chloride: 104 mmol/L (ref 98–110)
Creat: 0.95 mg/dL (ref 0.70–1.11)
Glucose, Bld: 103 mg/dL — ABNORMAL HIGH (ref 65–99)
Potassium: 4.7 mmol/L (ref 3.5–5.3)
Sodium: 138 mmol/L (ref 135–146)

## 2015-04-21 LAB — CBC
HCT: 37.2 % — ABNORMAL LOW (ref 39.0–52.0)
Hemoglobin: 12.8 g/dL — ABNORMAL LOW (ref 13.0–17.0)
MCH: 29.4 pg (ref 26.0–34.0)
MCHC: 34.4 g/dL (ref 30.0–36.0)
MCV: 85.3 fL (ref 78.0–100.0)
MPV: 10.5 fL (ref 8.6–12.4)
Platelets: 151 10*3/uL (ref 150–400)
RBC: 4.36 MIL/uL (ref 4.22–5.81)
RDW: 15.4 % (ref 11.5–15.5)
WBC: 9.9 10*3/uL (ref 4.0–10.5)

## 2015-04-21 LAB — PROTIME-INR
INR: 2.5 — ABNORMAL HIGH (ref <1.50)
Prothrombin Time: 27.4 seconds — ABNORMAL HIGH (ref 11.6–15.2)

## 2015-04-21 LAB — POCT INR: INR: 2.9

## 2015-04-21 NOTE — Telephone Encounter (Signed)
Rx request sent to pharmacy.  

## 2015-04-21 NOTE — Addendum Note (Signed)
Addended by: Eulis Foster on: 04/21/2015 08:16 AM   Modules accepted: Orders

## 2015-04-25 ENCOUNTER — Encounter (HOSPITAL_COMMUNITY)
Admission: RE | Disposition: A | Discharge status: Home / Self Care | Payer: Self-pay | Source: Ambulatory Visit | Attending: Internal Medicine

## 2015-04-25 ENCOUNTER — Ambulatory Visit (HOSPITAL_COMMUNITY)
Admission: RE | Admit: 2015-04-25 | Discharge: 2015-04-25 | Disposition: A | Discharge status: Home / Self Care | Payer: Medicare Other | Source: Ambulatory Visit | Attending: Internal Medicine | Admitting: Internal Medicine

## 2015-04-25 DIAGNOSIS — I484 Atypical atrial flutter: Secondary | ICD-10-CM | POA: Diagnosis not present

## 2015-04-25 DIAGNOSIS — J449 Chronic obstructive pulmonary disease, unspecified: Secondary | ICD-10-CM | POA: Diagnosis not present

## 2015-04-25 DIAGNOSIS — I5022 Chronic systolic (congestive) heart failure: Secondary | ICD-10-CM | POA: Diagnosis present

## 2015-04-25 DIAGNOSIS — Z880 Allergy status to penicillin: Secondary | ICD-10-CM | POA: Insufficient documentation

## 2015-04-25 DIAGNOSIS — I429 Cardiomyopathy, unspecified: Secondary | ICD-10-CM | POA: Diagnosis not present

## 2015-04-25 DIAGNOSIS — Z95810 Presence of automatic (implantable) cardiac defibrillator: Secondary | ICD-10-CM | POA: Diagnosis present

## 2015-04-25 DIAGNOSIS — Z7901 Long term (current) use of anticoagulants: Secondary | ICD-10-CM | POA: Diagnosis not present

## 2015-04-25 DIAGNOSIS — I482 Chronic atrial fibrillation: Secondary | ICD-10-CM | POA: Insufficient documentation

## 2015-04-25 DIAGNOSIS — I447 Left bundle-branch block, unspecified: Secondary | ICD-10-CM | POA: Insufficient documentation

## 2015-04-25 DIAGNOSIS — Z4502 Encounter for adjustment and management of automatic implantable cardiac defibrillator: Secondary | ICD-10-CM | POA: Diagnosis not present

## 2015-04-25 HISTORY — PX: EP IMPLANTABLE DEVICE: SHX172B

## 2015-04-25 LAB — GLUCOSE, CAPILLARY: Glucose-Capillary: 75 mg/dL (ref 65–99)

## 2015-04-25 LAB — SURGICAL PCR SCREEN
MRSA, PCR: NEGATIVE
Staphylococcus aureus: NEGATIVE

## 2015-04-25 LAB — PROTIME-INR
INR: 1.49 (ref 0.00–1.49)
Prothrombin Time: 18.1 seconds — ABNORMAL HIGH (ref 11.6–15.2)

## 2015-04-25 SURGERY — ICD/BIV ICD GENERATOR CHANGEOUT
Anesthesia: LOCAL

## 2015-04-25 MED ORDER — GENTAMICIN SULFATE 40 MG/ML IJ SOLN
80.0000 mg | INTRAMUSCULAR | Status: AC
  Administered 2015-04-25: 80 mg
  Filled 2015-04-25: qty 2

## 2015-04-25 MED ORDER — MUPIROCIN 2 % EX OINT: 1.0000 "application " | TOPICAL_OINTMENT | Freq: Once | CUTANEOUS | Status: DC

## 2015-04-25 MED ORDER — FENTANYL CITRATE (PF) 100 MCG/2ML IJ SOLN
INTRAMUSCULAR | Status: AC
  Filled 2015-04-25: qty 2

## 2015-04-25 MED ORDER — MUPIROCIN 2 % EX OINT
TOPICAL_OINTMENT | CUTANEOUS | Status: AC
  Administered 2015-04-25: 09:00:00
  Filled 2015-04-25: qty 22

## 2015-04-25 MED ORDER — FENTANYL CITRATE (PF) 100 MCG/2ML IJ SOLN
INTRAMUSCULAR | Status: DC | PRN
  Administered 2015-04-25: 25 ug via INTRAVENOUS

## 2015-04-25 MED ORDER — SODIUM CHLORIDE 0.9 % IR SOLN
Status: AC
  Filled 2015-04-25: qty 2

## 2015-04-25 MED ORDER — CHLORHEXIDINE GLUCONATE 4 % EX LIQD
60.0000 mL | Freq: Once | CUTANEOUS | Status: DC
  Filled 2015-04-25: qty 60

## 2015-04-25 MED ORDER — MIDAZOLAM HCL 5 MG/5ML IJ SOLN
INTRAMUSCULAR | Status: DC | PRN
  Administered 2015-04-25 (×2): 1 mg via INTRAVENOUS

## 2015-04-25 MED ORDER — LIDOCAINE HCL (PF) 1 % IJ SOLN
INTRAMUSCULAR | Status: AC
  Filled 2015-04-25: qty 60

## 2015-04-25 MED ORDER — VANCOMYCIN HCL IN DEXTROSE 1-5 GM/200ML-% IV SOLN
1000.0000 mg | INTRAVENOUS | Status: AC
  Administered 2015-04-25: 1000 mg via INTRAVENOUS
  Filled 2015-04-25 (×2): qty 200

## 2015-04-25 MED ORDER — LIDOCAINE HCL (PF) 1 % IJ SOLN
INTRAMUSCULAR | Status: DC | PRN
  Administered 2015-04-25: 45 mL via INTRADERMAL

## 2015-04-25 MED ORDER — SODIUM CHLORIDE 0.9 % IV SOLN
INTRAVENOUS | Status: DC
  Administered 2015-04-25: 08:00:00 via INTRAVENOUS

## 2015-04-25 MED ORDER — ONDANSETRON HCL 4 MG/2ML IJ SOLN: 4.0000 mg | Freq: Four times a day (QID) | INTRAMUSCULAR | Status: DC | PRN

## 2015-04-25 MED ORDER — MIDAZOLAM HCL 5 MG/5ML IJ SOLN
INTRAMUSCULAR | Status: AC
  Filled 2015-04-25: qty 5

## 2015-04-25 MED ORDER — ACETAMINOPHEN 325 MG PO TABS: 325.0000 mg | ORAL_TABLET | ORAL | Status: DC | PRN

## 2015-04-25 SURGICAL SUPPLY — 4 items
CABLE SURGICAL S-101-97-12 (CABLE) ×2 IMPLANT
ICD VIVA XT CRT-D DTBA1D1 (ICD Generator) ×2 IMPLANT
PAD DEFIB LIFELINK (PAD) ×2 IMPLANT
TRAY PACEMAKER INSERTION (PACKS) ×2 IMPLANT

## 2015-04-25 NOTE — H&P (Signed)
Cardiology Office Note Date: 04/08/2015  Patient ID: Behruz, Easterwood 1931-11-15, MRN [id number] PCP: Ria Bush, MD Electrophysiologist: Dr. Lovena Le   Chief Complaint: device has reached ERI  History of Present Illness: Dacion Faulk is a 82 y.o. male with history of NICM, AFlutter/CAFib (failed Tikosyn with ongoing PAFlutter and ERAF post DCCV), on coumadin managed with the [facility] coumadin clinic, LBBB, COPD/pulmonary nodules follows with [facility] pulmonologist though reports this as a resolved issue and felt to have been infectious, pulm HTN on his last echo, CHB with and ICD in place.  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 He denies any CP, palpitations, no dizziness, near syncope or syncope. He comes today accompanied by his wife and 2 of his children.  He is taking Levaquin currently for a sinus infection by his PMD, but feeling well.  Past Medical History  Diagnosis Date  . NICM (nonischemic cardiomyopathy) [facility])     Cardiac catheterization March 2006 without coronary disease; echocardiogram August 2008: EF 35%, mild AI, left atrial enlargement  . Atrial flutter ([place])     A. Status post cardioversion; B. Tikosyn therapy - failed, remains in aflutter  . COPD (chronic obstructive pulmonary disease) ([place])     spirometry 2015 - no obstruction, + mild restrictive lung disease  . LBBB (left bundle branch block)   . GERD (gastroesophageal reflux disease) 2010    h/o esophageal stricture with dilation, LA grade C reflux esophagitis by EGD 2010  . Porphyria ([place])   . IBS (irritable bowel syndrome)   . BPH (benign prostatic hyperplasia)   . History of diverticulitis of colon   . Allergic rhinitis     [name]  . Chronic systolic congestive heart failure ([place])   . History of GI bleed      Secondary to hemorrhoids  . Extrinsic asthma     [name]  . 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 Lucky Cowboy)  . Multiple pulmonary nodules 06/2013    RUL ([facility] Adam Phenix [facility]) - ?vasculitis as of last CT at [facility]  . Chronic sinusitis   . Goiter   . Bilateral pneumonia 11/02/2013    treated with levaquin    Past Surgical History  Procedure Laterality Date  . Penile prosthesis implant  2007    Otelin  . Cholecystectomy    . Goiter removal    . Nose surgery  1971  . Hernia repair    . Cardiac catheterization  06/22/04    Severe, nonischemic cardiomyopathy,EF 25-30%  . Biv-aicd implant      [employer]  . Colonoscopy  10/99    Divertic, splenic, hepatic fleure only  . Esophagogastroduodenoscopy  01/1998    stricture, sliding HH, GERD  . Esophageal dilation  02/18/98  . Esophagogastroduodenoscopy  04/08/01    stricture, gastritis, HH, GERD-no dilation(Dr. Henrene Pastor)  . Hernia repair  01/30/02    Dr. Hassell Done  . Pulmonary eval  04/2002    ([facility]) Chronic congestive symptoms  . US echocardiography  07/13/04    EF 25-30%, Mod LVH; LA severe dilation; Mild AR; IRTR  . Esophagogastroduodenoscopy  12/22/04    stricture; gastritis; duodenitis, GERD  . US echocardiography  11/27/06    hypokinesis posterior wall, EF 35%; mild AR  . Esophagogastroduodenoscopy  10/21/08    Reflux esophagitis; Erythem. Duod. (Dr. Vira Agar)  . Colonoscopy  10/21/08  aborted-divertics, int hemms (Dr. Vira Agar)  . Cardioversion  02/22/09    AFlutter-([facility])  . Coronary angioplasty    . Back surgery  1997    bulging disks  . Esophagogastroduodenoscopy  08/2013    erythema gastric fundus - minimal gastritis, HH ([facility])  . Pft  10/2013    FVC 61%, FEV1 63%, ratio 0.76    Current Outpatient Prescriptions   Medication Sig Dispense Refill  . acetaminophen (TYLENOL) 500 MG tablet Take 250 mg by mouth daily as needed. Pain    . ALPRAZolam (XANAX) 0.25 MG tablet Take 1 tablet (0.25 mg total) by mouth at bedtime. 60 tablet 0  . amoxicillin (AMOXIL) 875 MG tablet Take 1 tablet (875 mg total) by mouth 2 (two) times daily. 14 tablet 0  . carvedilol (COREG) 6.25 MG tablet TAKE 1 TABLET (6.25 MG TOTAL) BY MOUTH 2 (TWO) TIMES DAILY WITH MEALS 180 tablet 1  . cyanocobalamin (,VITAMIN B-12,) 1000 MCG/ML injection Inject 1 mL (1,000 mcg total) into the muscle every 30 (thirty) days. Started 03/2014    . docusate sodium (COLACE) 100 MG capsule Take 1 capsule (100 mg total) by mouth daily. (Patient taking differently: Take 100 mg by mouth daily as needed for mild constipation. )    . enalapril (VASOTEC) 5 MG tablet Take 1 tablet (5 mg total) by mouth 2 (two) times daily. 20 tablet 0  . flunisolide (NASAREL) 29 MCG/ACT (0.025%) nasal spray 2 sprays by Nasal route daily. Dose is for each nostril.     . furosemide (LASIX) 20 MG tablet Take 0.5 tablets (10 mg total) by mouth daily. 45 tablet 1  . levalbuterol (XOPENEX HFA) 45 MCG/ACT inhaler Inhale 1-2 puffs into the lungs every 6 (six) hours as needed for wheezing or shortness of breath. 1 Inhaler 1  . levofloxacin (LEVAQUIN) 750 MG tablet Take 750 mg by mouth daily.    . Loratadine 10 MG CAPS Take 1 capsule by mouth daily as needed (allergies).     . montelukast (SINGULAIR) 10 MG tablet Take 10 mg by mouth daily as needed (BREATHING).     Marland Kitchen ondansetron (ZOFRAN) 4 MG tablet Take 1 tablet (4 mg total) by mouth every 8 (eight) hours as needed for nausea or vomiting. 10 tablet 0  . sucralfate (CARAFATE) 1 G tablet TAKE 1 TABLET BY MOUTH AS DAILY NEEDED for stomach issues    . warfarin (COUMADIN) 5 MG tablet TAKE AS DIRECTED BY ANTICOAGULATION CLINIC 135 tablet 1   No current  facility-administered medications for this visit.    Allergies: Clarithromycin; Famotidine; Omeprazole; Oxytetracycline; Penicillins; Proton pump inhibitors; Ranitidine; and Doxycycline   Social History: The patient  reports that he has never smoked. He has never used smokeless tobacco. He reports that he does not drink alcohol or use illicit drugs.   Family History: The patient's family history includes Alcohol abuse in his maternal uncle; Coronary artery disease in his father; Dysphagia in his sister; Heart failure in his mother; Hypertension in his father; Other in his sister, sister, and sister.  ROS: Please see the history of present illness.  All other systems are reviewed and otherwise negative.   PHYSICAL EXAM:  VS: BP 118/64 mmHg  Pulse 77  Ht 5\' 6"  (1.676 m)  Wt 141 lb (63.957 kg)  BMI 22.77 kg/m2 BMI: Body mass index is 22.77 kg/(m^2). Well nourished, well developed, in no acute distress  HEENT: normocephalic, atraumatic  Neck: no JVD, carotid bruits or masses Cardiac: normal S1, S2; RRR;  no significant murmurs, no rubs, or gallops Lungs: clear to auscultation bilaterally, no wheezing, rhonchi or rales  Abd: soft, nontender MS: no deformity or atrophy Ext: no edema  Skin: warm and dry, no rash Neuro: No gross deficits appreciated Psych: euthymic mood, full affect  ICD site is stable, no tethering or discomfort   EKG: Done today shows AFib, V paced ICD check today shows his device tripped to ERI 03/31/15, normal device function, appears chronically in AF  09/22/14: Echocardiogram Study Conclusions - Left ventricle: The cavity size was mildly dilated. Wall thickness was normal. Systolic function was severely reduced. The estimated ejection fraction was in the range of 20% to 25%. Diffuse hypokinesis. Doppler parameters are consistent with restrictive physiology, indicative of decreased left ventricular diastolic compliance and/or increased  left atrial pressure. - Aortic valve: There was mild regurgitation. - Mitral valve: There was mild regurgitation. - Left atrium: The atrium was severely dilated. - Right atrium: The atrium was moderately dilated. - Pulmonary arteries: Systolic pressure was moderately to severely increased. PA peak pressure: 60 mm Hg (S).   Recent Labs: 04/07/2015: ALT 10; BUN 18; Creatinine, Ser 1.06; Hemoglobin 13.4; Platelets 141.0*; Potassium 4.6; Sodium 137; TSH 2.18  03/30/15: INR 2.3  Estimated Creatinine Clearance: 49.3 mL/min (by C-G formula based on Cr of 1.06).   Wt Readings from Last 3 Encounters:  04/08/15 141 lb (63.957 kg)  03/18/15 142 lb 8 oz (64.638 kg)  03/08/15 142 lb 9.6 oz (64.683 kg)     Other studies reviewed: Additional studies/records reviewed today include: summarized above  DEVICE information: MDT CRT ICD implanted 2012 by Dr. Lovena Le, original device 2007, noting RV lead MDT [phone] 07/24/10 OP note from ICD generator change: It should be noted that the patient did have a 6949 RV lead in, and the rate/sense portion of this lead was hooked up to the LV port, and the LV lead was placed in the rate/sense portion of the RV port. This was done to minimize lead problems secondary to the 6949 defibrillator lead.   ASSESSMENT AND PLAN:  1. CAFib, flutter History of failing Tikosyn and DCCV CHADS2Vasc is at least 3 on coumadin, monitored and managed by the coumadin clinic no bleeding or signs of bleeding reported by the patient  2. NICM with ICD, chronic CHF LBBB appears very well compensated On BB/ACE, diuretic ICD has reached ERI plan for generator change, hold coumadin pending labs/INF pre-procedure Discussion with the patient and family regarding the possibility of PPM rather then ICD as was brought up at his last visit with Dr. Lovena Le, they have discussed this at length at home, and we discussed again today, given his very active lifestyle and current  quality of life he would like to continue with ICD. Discussed risk/benefit of the procedure, possibility of new lead if any lead issue detected at the procedure time, and he would like to proceed.  Disposition: Complete his abx therapy for his sinusitis, we will plan for generator change in the next couple weeks, f/u with wound check post procedure, 3 months with Dr. Lovena Le, continue Q 3 month carelink remotes.  Current medicines are reviewed at length with the patient today. The patient did not have any concerns regarding medicines.  Haywood Lasso, PA-C 04/08/2015 8:13 AM   [facility] [address]  [place] [address] [phone] (office)  [phone] (fax       EP Attending  Patient seen and examined. He is well known to  me from prior clinic visits. He has class 2 CHF, severe LV dysfunction, and LBBB and is s/p BiV ICD which is at [facility]. Will plan to proceed with BiV ICD generator change out.  Mikle Bosworth.D.

## 2015-04-25 NOTE — Discharge Instructions (Signed)

## 2015-04-25 NOTE — H&P (Signed)
  ICD Criteria  Current LVEF:20%. Within 12 months prior to implant: Yes   Heart failure history: Yes, Class II  Cardiomyopathy history: Yes, Non-Ischemic Cardiomyopathy.  Atrial Fibrillation/Atrial Flutter: Yes, Long standing (> 1 Year).  Ventricular tachycardia history: No.  Cardiac arrest history: No.  History of syndromes with risk of sudden death: No.  Previous ICD: Yes, Reason for ICD:  Primary prevention.  Current ICD indication: Primary  PPM indication: Yes. Pacing type: Ventricular. Greater than 40% RV pacing requirement anticipated. Indication: Complete Heart Block   Class I or II Bradycardia indication present: Yes  Beta Blocker therapy for 3 or more months: Yes, prescribed.   Ace Inhibitor/ARB therapy for 3 or more months: Yes, prescribed.

## 2015-04-26 ENCOUNTER — Encounter (HOSPITAL_COMMUNITY): Payer: Self-pay | Admitting: Internal Medicine

## 2015-04-28 DIAGNOSIS — G8929 Other chronic pain: Secondary | ICD-10-CM | POA: Diagnosis not present

## 2015-04-28 DIAGNOSIS — M75122 Complete rotator cuff tear or rupture of left shoulder, not specified as traumatic: Secondary | ICD-10-CM | POA: Diagnosis not present

## 2015-04-28 DIAGNOSIS — M25512 Pain in left shoulder: Secondary | ICD-10-CM | POA: Diagnosis not present

## 2015-05-03 ENCOUNTER — Ambulatory Visit (INDEPENDENT_AMBULATORY_CARE_PROVIDER_SITE_OTHER): Payer: Medicare Other

## 2015-05-03 DIAGNOSIS — E538 Deficiency of other specified B group vitamins: Secondary | ICD-10-CM

## 2015-05-03 MED ORDER — CYANOCOBALAMIN 1000 MCG/ML IJ SOLN
1000.0000 ug | Freq: Once | INTRAMUSCULAR | Status: AC
  Administered 2015-05-03: 1000 ug via INTRAMUSCULAR

## 2015-05-05 ENCOUNTER — Ambulatory Visit (INDEPENDENT_AMBULATORY_CARE_PROVIDER_SITE_OTHER): Payer: Medicare Other | Admitting: *Deleted

## 2015-05-05 ENCOUNTER — Encounter: Payer: Self-pay | Admitting: Internal Medicine

## 2015-05-05 DIAGNOSIS — I429 Cardiomyopathy, unspecified: Secondary | ICD-10-CM | POA: Diagnosis not present

## 2015-05-05 DIAGNOSIS — Z95810 Presence of automatic (implantable) cardiac defibrillator: Secondary | ICD-10-CM | POA: Diagnosis not present

## 2015-05-05 DIAGNOSIS — I4892 Unspecified atrial flutter: Secondary | ICD-10-CM

## 2015-05-05 DIAGNOSIS — I5022 Chronic systolic (congestive) heart failure: Secondary | ICD-10-CM

## 2015-05-05 DIAGNOSIS — Z7901 Long term (current) use of anticoagulants: Secondary | ICD-10-CM

## 2015-05-05 DIAGNOSIS — I420 Dilated cardiomyopathy: Secondary | ICD-10-CM

## 2015-05-05 LAB — CUP PACEART INCLINIC DEVICE CHECK
Battery Remaining Longevity: 97 mo
Battery Voltage: 3.12 V
Brady Statistic AP VP Percent: 0 %
Brady Statistic AP VS Percent: 0 %
Brady Statistic AS VP Percent: 90.07 %
Brady Statistic AS VS Percent: 9.93 %
Brady Statistic RA Percent Paced: 0 %
Brady Statistic RV Percent Paced: 92.74 %
Date Time Interrogation Session: 20170126100542
HighPow Impedance: 38 Ohm
HighPow Impedance: 49 Ohm
Implantable Lead Implant Date: 20070525
Implantable Lead Implant Date: 20070525
Implantable Lead Implant Date: 20070525
Implantable Lead Location: 753858
Implantable Lead Location: 753859
Implantable Lead Location: 753860
Implantable Lead Model: 4194
Implantable Lead Model: 5076
Implantable Lead Model: 6949
Lead Channel Impedance Value: 190 Ohm
Lead Channel Impedance Value: 285 Ohm
Lead Channel Impedance Value: 361 Ohm
Lead Channel Impedance Value: 361 Ohm
Lead Channel Impedance Value: 418 Ohm
Lead Channel Impedance Value: 475 Ohm
Lead Channel Pacing Threshold Amplitude: 0.75 V
Lead Channel Pacing Threshold Amplitude: 1.25 V
Lead Channel Pacing Threshold Pulse Width: 0.4 ms
Lead Channel Pacing Threshold Pulse Width: 0.4 ms
Lead Channel Sensing Intrinsic Amplitude: 5.25 mV
Lead Channel Setting Pacing Amplitude: 2.5 V
Lead Channel Setting Pacing Amplitude: 2.5 V
Lead Channel Setting Pacing Pulse Width: 0.4 ms
Lead Channel Setting Pacing Pulse Width: 0.4 ms
Lead Channel Setting Sensing Sensitivity: 0.45 mV

## 2015-05-05 LAB — POCT INR: INR: 1.8

## 2015-05-05 NOTE — Progress Notes (Signed)
Wound check appointment. Steri-strips removed. Wound without redness or edema. Incision edges approximated, wound well healed. Normal device function. Thresholds, sensing, and impedances consistent with implant measurements. Device programmed at chronic outputs/auto-capture on. Patient bi-ventricularly paicing 92.7%, 7.2% VSRP. Histogram distribution appropriate for patient and level of activity. 197 AT/AF episodes (21.4%), EGMs suggest AFl, +warfarin. No ventricular arrhythmias noted. Patient educated about wound care, arm mobility, lifting restrictions, shock plan. ROV in 3 months with implanting physician.

## 2015-05-18 ENCOUNTER — Ambulatory Visit (INDEPENDENT_AMBULATORY_CARE_PROVIDER_SITE_OTHER): Payer: Medicare Other

## 2015-05-18 DIAGNOSIS — Z7901 Long term (current) use of anticoagulants: Secondary | ICD-10-CM

## 2015-05-18 DIAGNOSIS — I4892 Unspecified atrial flutter: Secondary | ICD-10-CM

## 2015-05-18 LAB — POCT INR: INR: 1.8

## 2015-06-08 ENCOUNTER — Ambulatory Visit (INDEPENDENT_AMBULATORY_CARE_PROVIDER_SITE_OTHER): Payer: Medicare Other

## 2015-06-08 DIAGNOSIS — I4892 Unspecified atrial flutter: Secondary | ICD-10-CM | POA: Diagnosis not present

## 2015-06-08 DIAGNOSIS — Z7901 Long term (current) use of anticoagulants: Secondary | ICD-10-CM

## 2015-06-08 LAB — POCT INR: INR: 2.2

## 2015-06-13 ENCOUNTER — Telehealth: Payer: Self-pay | Admitting: Internal Medicine

## 2015-06-13 NOTE — Telephone Encounter (Signed)
Spoke with patient, was near a bulldozer when the wind got the door and knocked into him. His site around his pacemaker is bruised but not swollen and has not had drainage. He did have some visual disturbances immediately afterwards, but these have resolved. Advised to follow up with primary care if they return or he has other neuro changes.  Chanetta Marshall, NP 06/13/2015 4:57 PM

## 2015-06-13 NOTE — Telephone Encounter (Signed)
New message      A door flew open and hit patient on his shoulder where his pacemaker is.  The area is bruised.  Should he be concerned about damage to a wire in his pacemaker?

## 2015-06-18 ENCOUNTER — Other Ambulatory Visit: Payer: Self-pay | Admitting: Cardiology

## 2015-06-19 NOTE — Progress Notes (Signed)
HPI [name] presents for follow up of his cardiomyopathy and atrial flutter. Since I last saw him he has done well.  He had his ICD replaced.  He had no problems with this.  The patient denies any new symptoms such as chest discomfort, neck or arm discomfort. There has been no new shortness of breath, PND or orthopnea. There have been no reported palpitations, presyncope or syncope.    Allergies  Allergen Reactions  . Clarithromycin Nausea Only  . Famotidine Other (See Comments)    ABD. PAIN  . Levaquin [Levofloxacin] Other (See Comments)    Stomach pain, has to eat a lot of food  . Omeprazole Diarrhea  . Oxytetracycline Other (See Comments)    BUMPS  . Penicillins Swelling      . Proton Pump Inhibitors Other (See Comments)    GI upset, diarrhea, gas, bloating  . Ranitidine Diarrhea  . Doxycycline Rash    Current Outpatient Prescriptions  Medication Sig Dispense Refill  . acetaminophen (TYLENOL) 500 MG tablet Take 250 mg by mouth daily as needed. Pain    . ALPRAZolam (XANAX) 0.25 MG tablet Take 1 tablet (0.25 mg total) by mouth at bedtime. 60 tablet 0  . alum & mag hydroxide-simeth (MAALOX/MYLANTA) 200-200-20 MG/5ML suspension Take 15 mLs by mouth daily as needed for indigestion or heartburn.    . carvedilol (COREG) 6.25 MG tablet TAKE 1 TABLET (6.25 MG TOTAL) BY MOUTH 2 (TWO) TIMES DAILY WITH MEALS 180 tablet 1  . cyanocobalamin (,VITAMIN B-12,) 1000 MCG/ML injection Inject 1 mL (1,000 mcg total) into the muscle every 30 (thirty) days. Started 03/2014    . enalapril (VASOTEC) 5 MG tablet Take 1 tablet (5 mg total) by mouth 2 (two) times daily. 20 tablet 0  . flunisolide (NASAREL) 29 MCG/ACT (0.025%) nasal spray 2 sprays by Nasal route daily. Dose is for each nostril.     . furosemide (LASIX) 20 MG tablet TAKE 1/2 TABLET EVERY DAY 45 tablet 0  . levalbuterol (XOPENEX HFA) 45 MCG/ACT inhaler Inhale 1-2 puffs into the lungs every 6 (six) hours as needed for  wheezing or shortness of breath. 1 Inhaler 1  . Loratadine 10 MG CAPS Take 1 capsule by mouth daily as needed (allergies).     . montelukast (SINGULAIR) 10 MG tablet Take 10 mg by mouth daily as needed (BREATHING).     Marland Kitchen ondansetron (ZOFRAN) 4 MG tablet Take 1 tablet (4 mg total) by mouth every 8 (eight) hours as needed for nausea or vomiting. 20 tablet 0  . polyethylene glycol (MIRALAX / GLYCOLAX) packet Take 17 g by mouth daily as needed.    . sucralfate (CARAFATE) 1 G tablet TAKE 1 TABLET BY MOUTH AS DAILY NEEDED for stomach issues    . warfarin (COUMADIN) 5 MG tablet TAKE AS DIRECTED  BY  ANTICOAGULATION  CLINIC 135 tablet 1   No current facility-administered medications for this visit.    Past Medical History  Diagnosis Date  . NICM (nonischemic cardiomyopathy) [facility])     Cardiac catheterization March 2006 without coronary disease; echocardiogram August 2008: EF 35%, mild AI, left atrial enlargement  . Atrial flutter ([place])     A.  Status post cardioversion; B.  Tikosyn therapy - failed, remains in aflutter  . COPD (chronic obstructive pulmonary disease) ([place])     spirometry 2015 - no obstruction, + mild restrictive lung disease  . LBBB (left bundle branch block)   . GERD (gastroesophageal reflux disease) 2010  h/o esophageal stricture with dilation, LA grade C reflux esophagitis by EGD 2010  . Porphyria ([place])   . IBS (irritable bowel syndrome)   . BPH (benign prostatic hyperplasia)   . History of diverticulitis of colon   . Allergic rhinitis     [name]  . Chronic systolic congestive heart failure ([place])   . History of GI bleed     Secondary to hemorrhoids  . Extrinsic asthma     [name]  . 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 Lucky Cowboy)  . Multiple pulmonary nodules 06/2013    RUL ([facility]) - ?vasculitis as of last CT [facility]  . Chronic sinusitis   . Goiter   . Bilateral pneumonia 11/02/2013    treated with levaquin     Past Surgical History  Procedure Laterality Date  . Penile prosthesis implant  2007    Otelin  . Cholecystectomy    . Goiter removal    . Nose surgery  1971  . Hernia repair    . Cardiac catheterization  06/22/04    Severe, nonischemic cardiomyopathy,EF 25-30%  . Biv-aicd implant      Medtronic  . Colonoscopy  10/99    Divertic, splenic, hepatic fleure only  . Esophagogastroduodenoscopy  01/1998    stricture, sliding HH, GERD  . Esophageal dilation  02/18/98  . Esophagogastroduodenoscopy  04/08/01    stricture, gastritis, HH, GERD-no dilation(Dr. Henrene Pastor)  . Hernia repair  01/30/02    Dr. Hassell Done  . Pulmonary eval  04/2002    ([facility]) Chronic congestive symptoms  . US echocardiography  07/13/04    EF 25-30%, Mod LVH; LA severe dilation; Mild AR; IRTR  . Esophagogastroduodenoscopy  12/22/04    stricture; gastritis; duodenitis, GERD  . US echocardiography  11/27/06    hypokinesis posterior wall, EF 35%; mild AR  . Esophagogastroduodenoscopy  10/21/08    Reflux esophagitis; Erythem. Duod. (Dr. Vira Agar)  . Colonoscopy  10/21/08    aborted-divertics, int hemms (Dr. Vira Agar)  . Cardioversion  02/22/09    AFlutter-([facility])  . Coronary angioplasty    . Back surgery  1997    bulging disks  . Esophagogastroduodenoscopy  08/2013    erythema gastric fundus - minimal gastritis, HH ([facility])  . Pft  10/2013    FVC 61%, FEV1 63%, ratio 0.76  . Ep implantable device N/A 04/25/2015    Procedure: BIV ICD Generator Changeout;  Surgeon: Evans Lance, MD;  Location: [place] [facility];  Service: Cardiovascular;  Laterality: N/A;    ROS:  Left shoulder pain, constipation.   Otherwise as stated in the HPI and negative for all other systems.  PHYSICAL EXAM BP 118/68 mmHg  Pulse 88  Ht 5\' 6"  (1.676 m)  Wt 140 lb (63.504 kg)  BMI 22.61 kg/m2 GENERAL:  Well appearing NECK:  No jugular venous distention, waveform within normal limits, carotid upstroke brisk and symmetric, no bruits, no thyromegaly LUNGS:   Diffuse fine crackles CHEST:  Well healed ICD pocket HEART:  PMI not displaced or sustained,S1 and S2 within normal limits, no S3, no clicks, no rubs, 2 out of 6 apical diastolic murmur short and nonradiating  ABD:  Flat, positive bowel sounds normal in frequency in pitch, no bruits, no rebound, no guarding, no midline pulsatile mass, no hepatomegaly, no splenomegaly EXT:  2 plus pulses throughout, no edema, no cyanosis no clubbing   ASSESSMENT AND PLAN  Nonischemic cardiomyopathy -  He seems to be euvolemic. No change in therapy is indicated.   Atrial flutter -  The patient tolerates this rhythm and rate control and anticoagulation. We will continue with the meds as listed. He does not want to switch from warfarin.    Pulmonary HTN - This was noted at the time of his last echo. However, he really have physical findings consistent with this her RV failure. He has no overt symptoms. No change in therapy is indicated.

## 2015-06-20 ENCOUNTER — Encounter: Payer: Self-pay | Admitting: Cardiology

## 2015-06-20 ENCOUNTER — Ambulatory Visit (INDEPENDENT_AMBULATORY_CARE_PROVIDER_SITE_OTHER): Payer: Medicare Other | Admitting: Cardiology

## 2015-06-20 VITALS — BP 118/68 | HR 88 | Ht 66.0 in | Wt 140.0 lb

## 2015-06-20 DIAGNOSIS — I420 Dilated cardiomyopathy: Secondary | ICD-10-CM

## 2015-06-20 NOTE — Patient Instructions (Signed)
Your physician recommends that you schedule a follow-up appointment in: 6 Months  

## 2015-06-21 ENCOUNTER — Ambulatory Visit (INDEPENDENT_AMBULATORY_CARE_PROVIDER_SITE_OTHER): Payer: Medicare Other

## 2015-06-21 DIAGNOSIS — E538 Deficiency of other specified B group vitamins: Secondary | ICD-10-CM

## 2015-06-21 MED ORDER — CYANOCOBALAMIN 1000 MCG/ML IJ SOLN
1000.0000 ug | Freq: Once | INTRAMUSCULAR | Status: AC
  Administered 2015-06-21: 1000 ug via INTRAMUSCULAR

## 2015-06-23 ENCOUNTER — Telehealth: Payer: Self-pay

## 2015-06-23 DIAGNOSIS — H1045 Other chronic allergic conjunctivitis: Secondary | ICD-10-CM | POA: Diagnosis not present

## 2015-06-23 DIAGNOSIS — J300 Vasomotor rhinitis: Secondary | ICD-10-CM | POA: Diagnosis not present

## 2015-06-23 DIAGNOSIS — J019 Acute sinusitis, unspecified: Secondary | ICD-10-CM | POA: Diagnosis not present

## 2015-06-23 DIAGNOSIS — J4530 Mild persistent asthma, uncomplicated: Secondary | ICD-10-CM | POA: Diagnosis not present

## 2015-06-23 NOTE — Telephone Encounter (Signed)
Returned call to pt & he is on Levaquin 500mg  for 7-10 days & taking a Prednisone taper dose (4,3,2,1 tabs for 4 days). He started both meds on 06/23/15.  Advised that both meds with interact with Coumadin making the INR supratherapeutic.  He was instructed to eat some extra green intake & to report on Monday to have INR checked in the [place] office & he verbalized understanding.

## 2015-06-23 NOTE — Telephone Encounter (Signed)
Patient wants to talk to [facility] about coumadin check as he is now on abx.  Please call to discuss.

## 2015-06-27 ENCOUNTER — Ambulatory Visit (INDEPENDENT_AMBULATORY_CARE_PROVIDER_SITE_OTHER): Payer: Medicare Other

## 2015-06-27 DIAGNOSIS — I4892 Unspecified atrial flutter: Secondary | ICD-10-CM | POA: Diagnosis not present

## 2015-06-27 DIAGNOSIS — Z7901 Long term (current) use of anticoagulants: Secondary | ICD-10-CM

## 2015-06-27 LAB — POCT INR: INR: 2.6

## 2015-06-27 NOTE — Addendum Note (Signed)
Addended by: Georgiana Shore on: 06/27/2015 09:02 AM   Modules accepted: Level of Service

## 2015-07-06 ENCOUNTER — Telehealth: Payer: Self-pay | Admitting: Cardiovascular Disease

## 2015-07-06 ENCOUNTER — Ambulatory Visit (INDEPENDENT_AMBULATORY_CARE_PROVIDER_SITE_OTHER): Payer: Medicare Other

## 2015-07-06 DIAGNOSIS — Z7901 Long term (current) use of anticoagulants: Secondary | ICD-10-CM

## 2015-07-06 DIAGNOSIS — I4892 Unspecified atrial flutter: Secondary | ICD-10-CM

## 2015-07-06 LAB — PT (THROMBOREL-S): PT (Thromborel-S): 65.6 s — ABNORMAL HIGH (ref 10.7–13.1)

## 2015-07-06 LAB — POCT INR: INR: 7.3

## 2015-07-06 LAB — PROTIME-INR

## 2015-07-06 LAB — INR (THROMBOREL-S): INR (Thromborel-S): 5.8 — ABNORMAL HIGH (ref 0.8–1.2)

## 2015-07-06 NOTE — Telephone Encounter (Signed)
Received call from [place] regarding pt PT/INR results. Labs drawn d/t elevated INR at anti-coag visit today. PT 65.8, INR 5.8 Results faxed and given to Rochel Brome, RN, who ordered labs

## 2015-07-13 ENCOUNTER — Ambulatory Visit (INDEPENDENT_AMBULATORY_CARE_PROVIDER_SITE_OTHER): Payer: Medicare Other

## 2015-07-13 DIAGNOSIS — I4892 Unspecified atrial flutter: Secondary | ICD-10-CM

## 2015-07-13 DIAGNOSIS — Z7901 Long term (current) use of anticoagulants: Secondary | ICD-10-CM

## 2015-07-13 LAB — POCT INR: INR: 1.9

## 2015-08-01 ENCOUNTER — Other Ambulatory Visit: Payer: Self-pay | Admitting: *Deleted

## 2015-08-01 MED ORDER — ALPRAZOLAM 0.25 MG PO TABS: 0.2500 mg | ORAL_TABLET | Freq: Every day | ORAL | Status: DC

## 2015-08-01 NOTE — Telephone Encounter (Signed)
Patient left a voicemail requesting a written script for Xanax. Patient stated that he is coming in the morning for a B-12 injection and would like to pick it up at that time. Last refill 03/18/15 #60 Last office visit 04/14/15

## 2015-08-01 NOTE — Telephone Encounter (Signed)
Printed and in Kim's box 

## 2015-08-02 ENCOUNTER — Ambulatory Visit (INDEPENDENT_AMBULATORY_CARE_PROVIDER_SITE_OTHER): Payer: Medicare Other | Admitting: *Deleted

## 2015-08-02 DIAGNOSIS — E538 Deficiency of other specified B group vitamins: Secondary | ICD-10-CM | POA: Diagnosis not present

## 2015-08-02 MED ORDER — CYANOCOBALAMIN 1000 MCG/ML IJ SOLN
1000.0000 ug | Freq: Once | INTRAMUSCULAR | Status: AC
  Administered 2015-08-02: 1000 ug via INTRAMUSCULAR

## 2015-08-02 NOTE — Telephone Encounter (Addendum)
Rx called in to [facility]. Written Rx shredded.

## 2015-08-09 ENCOUNTER — Ambulatory Visit (INDEPENDENT_AMBULATORY_CARE_PROVIDER_SITE_OTHER): Payer: Medicare Other | Admitting: Internal Medicine

## 2015-08-09 ENCOUNTER — Encounter: Payer: Self-pay | Admitting: Internal Medicine

## 2015-08-09 VITALS — BP 116/68 | HR 72 | Ht 66.0 in | Wt 137.0 lb

## 2015-08-09 DIAGNOSIS — I5022 Chronic systolic (congestive) heart failure: Secondary | ICD-10-CM | POA: Diagnosis not present

## 2015-08-09 LAB — CUP PACEART INCLINIC DEVICE CHECK
Battery Remaining Longevity: 91 mo
Battery Voltage: 3.04 V
Brady Statistic AP VP Percent: 0 %
Brady Statistic AP VS Percent: 0 %
Brady Statistic AS VP Percent: 92.89 %
Brady Statistic AS VS Percent: 7.11 %
Brady Statistic RA Percent Paced: 0 %
Brady Statistic RV Percent Paced: 93.96 %
Date Time Interrogation Session: 20170502130439
HighPow Impedance: 40 Ohm
HighPow Impedance: 52 Ohm
Implantable Lead Implant Date: 20070525
Implantable Lead Implant Date: 20070525
Implantable Lead Implant Date: 20070525
Implantable Lead Location: 753858
Implantable Lead Location: 753859
Implantable Lead Location: 753860
Implantable Lead Model: 4194
Implantable Lead Model: 5076
Implantable Lead Model: 6949
Lead Channel Impedance Value: 190 Ohm
Lead Channel Impedance Value: 285 Ohm
Lead Channel Impedance Value: 342 Ohm
Lead Channel Impedance Value: 361 Ohm
Lead Channel Impedance Value: 456 Ohm
Lead Channel Impedance Value: 475 Ohm
Lead Channel Pacing Threshold Amplitude: 0.5 V
Lead Channel Pacing Threshold Amplitude: 1 V
Lead Channel Pacing Threshold Pulse Width: 0.4 ms
Lead Channel Pacing Threshold Pulse Width: 0.4 ms
Lead Channel Sensing Intrinsic Amplitude: 12.875 mV
Lead Channel Sensing Intrinsic Amplitude: 5.375 mV
Lead Channel Setting Pacing Amplitude: 2.5 V
Lead Channel Setting Pacing Amplitude: 2.5 V
Lead Channel Setting Pacing Pulse Width: 0.4 ms
Lead Channel Setting Pacing Pulse Width: 0.4 ms
Lead Channel Setting Sensing Sensitivity: 0.45 mV

## 2015-08-09 NOTE — Patient Instructions (Signed)
Medication Instructions:  Your physician recommends that you continue on your current medications as directed. Please refer to the Current Medication list given to you today.   Labwork: None ordered   Testing/Procedures: None ordered   Follow-Up: Your physician wants you to follow-up in: 12 months with Dr Taylor You will receive a reminder letter in the mail two months in advance. If you don't receive a letter, please call our office to schedule the follow-up appointment.  Remote monitoring is used to monitor your  ICD from home. This monitoring reduces the number of office visits required to check your device to one time per year. It allows us to keep an eye on the functioning of your device to ensure it is working properly. You are scheduled for a device check from home on 11/08/15. You may send your transmission at any time that day. If you have a wireless device, the transmission will be sent automatically. After your physician reviews your transmission, you will receive a postcard with your next transmission date.     Any Other Special Instructions Will Be Listed Below (If Applicable).     If you need a refill on your cardiac medications before your next appointment, please call your pharmacy.   

## 2015-08-09 NOTE — Progress Notes (Signed)
HPI Mr. Schwantz returns today for followup. He is a very pleasant 82 year old man with a nonischemic cardiomyopathy, complete heart block, chronic systolic heart failure, chronic atrial fibrillation, status post biventricular ICD implantation secondary to all the above in the setting of left bundle branch block. The patient denies chest pain, shortness of breath, or peripheral edema. He has done well since his last visit. He has undergone ICD generator change out. 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 He is caring/tending over 400 tomato plants.   Current Outpatient Prescriptions  Medication Sig Dispense Refill  . acetaminophen (TYLENOL) 500 MG tablet Take 250 mg by mouth daily as needed. Pain    . ALPRAZolam (XANAX) 0.25 MG tablet Take 1 tablet (0.25 mg total) by mouth at bedtime. 60 tablet 0  . alum & mag hydroxide-simeth (MAALOX/MYLANTA) 200-200-20 MG/5ML suspension Take 15 mLs by mouth daily as needed for indigestion or heartburn.    . carvedilol (COREG) 6.25 MG tablet TAKE 1 TABLET TWICE DAILY WITH MEALS 180 tablet 1  . cyanocobalamin (,VITAMIN B-12,) 1000 MCG/ML injection Inject 1 mL (1,000 mcg total) into the muscle every 30 (thirty) days. Started 03/2014    . enalapril (VASOTEC) 5 MG tablet Take 1 tablet (5 mg total) by mouth 2 (two) times daily. 20 tablet 0  . flunisolide (NASAREL) 29 MCG/ACT (0.025%) nasal spray 2 sprays by Nasal route daily. Dose is for each nostril.     . furosemide (LASIX) 20 MG tablet TAKE 1/2 TABLET EVERY DAY 45 tablet 0  . levalbuterol (XOPENEX HFA) 45 MCG/ACT inhaler Inhale 1-2 puffs into the lungs every 6 (six) hours as needed for wheezing or shortness of breath. 1 Inhaler 1  . Loratadine 10 MG CAPS Take 1 capsule by mouth daily as needed (allergies).     . montelukast (SINGULAIR) 10 MG tablet Take 10 mg by mouth daily as needed (BREATHING).     . polyethylene glycol (MIRALAX / GLYCOLAX) packet Take 17 g by mouth daily as needed.    . sucralfate  (CARAFATE) 1 G tablet TAKE 1 TABLET BY MOUTH AS DAILY NEEDED for stomach issues    . warfarin (COUMADIN) 5 MG tablet TAKE AS DIRECTED  BY  ANTICOAGULATION  CLINIC 135 tablet 1   No current facility-administered medications for this visit.     Past Medical History  Diagnosis Date  . NICM (nonischemic cardiomyopathy) [facility])     Cardiac catheterization March 2006 without coronary disease; echocardiogram August 2008: EF 35%, mild AI, left atrial enlargement  . Atrial flutter ([place])     A.  Status post cardioversion; B.  Tikosyn therapy - failed, remains in aflutter  . COPD (chronic obstructive pulmonary disease) ([place])     spirometry 2015 - no obstruction, + mild restrictive lung disease  . LBBB (left bundle branch block)   . GERD (gastroesophageal reflux disease) 2010    h/o esophageal stricture with dilation, LA grade C reflux esophagitis by EGD 2010  . Porphyria ([place])   . IBS (irritable bowel syndrome)   . BPH (benign prostatic hyperplasia)   . History of diverticulitis of colon   . Allergic rhinitis     [name]  . Chronic systolic congestive heart failure ([place])   . History of GI bleed     Secondary to hemorrhoids  . Extrinsic asthma     [name]  . 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 Lucky Cowboy)  . Multiple pulmonary nodules 06/2013  RUL ([facility] Phenix [facility]) - ?vasculitis as of last CT at [facility]  . Chronic sinusitis   . Goiter   . Bilateral pneumonia 11/02/2013    treated with levaquin    ROS:   All systems reviewed and negative except as noted in the HPI.   Past Surgical History  Procedure Laterality Date  . Penile prosthesis implant  2007    Otelin  . Cholecystectomy    . Goiter removal    . Nose surgery  1971  . Hernia repair    . Cardiac catheterization  06/22/04    Severe, nonischemic cardiomyopathy,EF 25-30%  . Biv-aicd implant      [employer]tronic  . Colonoscopy  10/99    Divertic, splenic, hepatic fleure only  .  Esophagogastroduodenoscopy  01/1998    stricture, sliding HH, GERD  . Esophageal dilation  02/18/98  . Esophagogastroduodenoscopy  04/08/01    stricture, gastritis, HH, GERD-no dilation(Dr. Henrene Pastor)  . Hernia repair  01/30/02    Dr. Hassell Done  . Pulmonary eval  04/2002    ([facility]) Chronic congestive symptoms  . US echocardiography  07/13/04    EF 25-30%, Mod LVH; LA severe dilation; Mild AR; IRTR  . Esophagogastroduodenoscopy  12/22/04    stricture; gastritis; duodenitis, GERD  . US echocardiography  11/27/06    hypokinesis posterior wall, EF 35%; mild AR  . Esophagogastroduodenoscopy  10/21/08    Reflux esophagitis; Erythem. Duod. (Dr. Vira Agar)  . Colonoscopy  10/21/08    aborted-divertics, int hemms (Dr. Vira Agar)  . Cardioversion  02/22/09    AFlutter-([facility])  . Coronary angioplasty    . Back surgery  1997    bulging disks  . Esophagogastroduodenoscopy  08/2013    erythema gastric fundus - minimal gastritis, HH ([facility])  . Pft  10/2013    FVC 61%, FEV1 63%, ratio 0.76  . Ep implantable device N/A 04/25/2015    Procedure: BIV ICD Generator Changeout;  Surgeon: Evans Lance, MD;  Location: [place] [facility];  Service: Cardiovascular;  Laterality: N/A;     Family History  Problem Relation Age of Onset  . Coronary artery disease Father   . Hypertension Father   . Dysphagia Sister   . Breast cancer    . Ovarian cancer    . Uterine cancer    . Heart failure Mother   . Other Sister     stomach problems  . Other Sister     stomach problems  . Other Sister     stomach problems  . Alcohol abuse Maternal Uncle      Social History   Social History  . Marital Status: Married    Spouse Name: N/A  . Number of Children: 3  . Years of Education: N/A   Occupational History  . retired   .     Social History Main Topics  . Smoking status: Never Smoker   . Smokeless tobacco: Never Used  . Alcohol Use: No  . Drug Use: No  . Sexual Activity: Not on file   Other Topics Concern  . Not on  file   Social History Narrative   Lives with wife   3 children-8 grandchildren   Still works on a farm.   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 Jefm Bryant   Activity: No regular exercise   Diet: good water, fruits/vegetables daily     BP 116/68 mmHg  Pulse 72  Ht 5\' 6"  (  1.676 m)  Wt 137 lb (62.143 kg)  BMI 22.12 kg/m2  Physical Exam:  Well appearing 82 year old man, NAD HEENT: Unremarkable Neck:  6 cm JVD, no thyromegally Lungs:  Clear with no wheezes, rales, or rhonchi. Well-healed ICD incision. HEART:  Regular rate rhythm, no murmurs, no rubs, no clicks Abd:  soft, positive bowel sounds, no organomegally, no rebound, no guarding Ext:  2 plus pulses, no edema, no cyanosis, no clubbing Skin:  No rashes no nodules Neuro:  CN II through XII intact, motor grossly intact  ECG - atrial flutter with Biv pacing  DEVICE  Normal device function.  See PaceArt for details.   Assess/Plan:  1. Chronic systolic heart failure - his symptoms are well controlled. He will continue his current meds. 2. Atrial flutter - this is left atrial and his rate is well controlled.  3. ICD - his Medtronic BiV ICD is working normally. Will follow.  Mikle Bosworth.D.

## 2015-08-10 ENCOUNTER — Ambulatory Visit (INDEPENDENT_AMBULATORY_CARE_PROVIDER_SITE_OTHER): Payer: Medicare Other

## 2015-08-10 DIAGNOSIS — I4892 Unspecified atrial flutter: Secondary | ICD-10-CM

## 2015-08-10 DIAGNOSIS — Z7901 Long term (current) use of anticoagulants: Secondary | ICD-10-CM | POA: Diagnosis not present

## 2015-08-10 LAB — POCT INR: INR: 1.7

## 2015-08-16 ENCOUNTER — Other Ambulatory Visit: Payer: Self-pay | Admitting: Cardiology

## 2015-09-02 ENCOUNTER — Ambulatory Visit (INDEPENDENT_AMBULATORY_CARE_PROVIDER_SITE_OTHER): Payer: Medicare Other

## 2015-09-02 DIAGNOSIS — E538 Deficiency of other specified B group vitamins: Secondary | ICD-10-CM

## 2015-09-02 MED ORDER — CYANOCOBALAMIN 1000 MCG/ML IJ SOLN
1000.0000 ug | Freq: Once | INTRAMUSCULAR | Status: AC
  Administered 2015-09-02: 1000 ug via INTRAMUSCULAR

## 2015-09-07 ENCOUNTER — Ambulatory Visit (INDEPENDENT_AMBULATORY_CARE_PROVIDER_SITE_OTHER): Payer: Medicare Other

## 2015-09-07 DIAGNOSIS — I4892 Unspecified atrial flutter: Secondary | ICD-10-CM | POA: Diagnosis not present

## 2015-09-07 DIAGNOSIS — Z7901 Long term (current) use of anticoagulants: Secondary | ICD-10-CM | POA: Diagnosis not present

## 2015-09-07 LAB — POCT INR: INR: 1.9

## 2015-10-05 ENCOUNTER — Ambulatory Visit (INDEPENDENT_AMBULATORY_CARE_PROVIDER_SITE_OTHER): Payer: Medicare Other | Admitting: *Deleted

## 2015-10-05 DIAGNOSIS — Z7901 Long term (current) use of anticoagulants: Secondary | ICD-10-CM | POA: Diagnosis not present

## 2015-10-05 DIAGNOSIS — I4892 Unspecified atrial flutter: Secondary | ICD-10-CM | POA: Diagnosis not present

## 2015-10-05 DIAGNOSIS — E538 Deficiency of other specified B group vitamins: Secondary | ICD-10-CM

## 2015-10-05 LAB — POCT INR: INR: 2.1

## 2015-10-05 MED ORDER — CYANOCOBALAMIN 1000 MCG/ML IJ SOLN
1000.0000 ug | Freq: Once | INTRAMUSCULAR | Status: AC
  Administered 2015-10-05: 1000 ug via INTRAMUSCULAR

## 2015-10-24 ENCOUNTER — Other Ambulatory Visit: Payer: Self-pay | Admitting: Cardiology

## 2015-10-25 DIAGNOSIS — M7541 Impingement syndrome of right shoulder: Secondary | ICD-10-CM | POA: Diagnosis not present

## 2015-11-02 ENCOUNTER — Ambulatory Visit (INDEPENDENT_AMBULATORY_CARE_PROVIDER_SITE_OTHER): Payer: Medicare Other

## 2015-11-02 DIAGNOSIS — I4892 Unspecified atrial flutter: Secondary | ICD-10-CM | POA: Diagnosis not present

## 2015-11-02 DIAGNOSIS — Z7901 Long term (current) use of anticoagulants: Secondary | ICD-10-CM

## 2015-11-02 LAB — POCT INR: INR: 1.7

## 2015-11-08 ENCOUNTER — Encounter: Payer: Medicare Other | Admitting: *Deleted

## 2015-11-08 ENCOUNTER — Telehealth: Payer: Self-pay | Admitting: Cardiology

## 2015-11-08 NOTE — Telephone Encounter (Signed)
Spoke with pt and reminded pt of remote transmission that is due today. Pt verbalized understanding.   

## 2015-11-11 ENCOUNTER — Encounter: Payer: Self-pay | Admitting: Cardiology

## 2015-11-22 ENCOUNTER — Other Ambulatory Visit: Payer: Self-pay | Admitting: Physician Assistant

## 2015-11-22 DIAGNOSIS — M75121 Complete rotator cuff tear or rupture of right shoulder, not specified as traumatic: Secondary | ICD-10-CM

## 2015-12-02 ENCOUNTER — Encounter: Payer: Self-pay | Admitting: Cardiology

## 2015-12-05 DIAGNOSIS — M7581 Other shoulder lesions, right shoulder: Secondary | ICD-10-CM | POA: Diagnosis not present

## 2015-12-05 DIAGNOSIS — M75121 Complete rotator cuff tear or rupture of right shoulder, not specified as traumatic: Secondary | ICD-10-CM | POA: Diagnosis not present

## 2015-12-07 ENCOUNTER — Ambulatory Visit (INDEPENDENT_AMBULATORY_CARE_PROVIDER_SITE_OTHER): Payer: Medicare Other | Admitting: *Deleted

## 2015-12-07 DIAGNOSIS — Z7901 Long term (current) use of anticoagulants: Secondary | ICD-10-CM | POA: Diagnosis not present

## 2015-12-07 DIAGNOSIS — I4892 Unspecified atrial flutter: Secondary | ICD-10-CM

## 2015-12-07 LAB — POCT INR: INR: 3.5

## 2015-12-10 ENCOUNTER — Encounter: Payer: Self-pay | Admitting: Family Medicine

## 2015-12-10 DIAGNOSIS — M75121 Complete rotator cuff tear or rupture of right shoulder, not specified as traumatic: Secondary | ICD-10-CM | POA: Insufficient documentation

## 2015-12-13 NOTE — Progress Notes (Signed)
HPI [name] presents for follow up of his cardiomyopathy and atrial flutter. Since I last saw him he has done well.  The patient denies any new symptoms such as chest discomfort, neck or arm discomfort. There has been no new shortness of breath, PND or orthopnea. There have been no reported palpitations, presyncope or syncope.  He is working his tomatoes.  He has no problems with this.  He might need to get his shoulder replaced as this is his only limiting issue.     Allergies  Allergen Reactions  . Clarithromycin Nausea Only  . Famotidine Other (See Comments)    ABD. PAIN  . Levaquin [Levofloxacin] Other (See Comments)    Stomach pain, has to eat a lot of food  . Omeprazole Diarrhea  . Oxytetracycline Other (See Comments)    BUMPS  . Penicillins Swelling      . Proton Pump Inhibitors Other (See Comments)    GI upset, diarrhea, gas, bloating  . Ranitidine Diarrhea  . Doxycycline Rash    Current Outpatient Prescriptions  Medication Sig Dispense Refill  . acetaminophen (TYLENOL) 500 MG tablet Take 250 mg by mouth daily as needed. Pain    . ALPRAZolam (XANAX) 0.25 MG tablet Take 1 tablet (0.25 mg total) by mouth at bedtime. 60 tablet 0  . alum & mag hydroxide-simeth (MAALOX/MYLANTA) 200-200-20 MG/5ML suspension Take 15 mLs by mouth daily as needed for indigestion or heartburn.    . carvedilol (COREG) 6.25 MG tablet TAKE 1 TABLET TWICE DAILY WITH MEALS 180 tablet 1  . cyanocobalamin (,VITAMIN B-12,) 1000 MCG/ML injection Inject 1 mL (1,000 mcg total) into the muscle every 30 (thirty) days. Started 03/2014    . enalapril (VASOTEC) 5 MG tablet TAKE 1 TABLET TWICE DAILY 180 tablet 4  . flunisolide (NASAREL) 29 MCG/ACT (0.025%) nasal spray 2 sprays by Nasal route daily. Dose is for each nostril.     . furosemide (LASIX) 20 MG tablet TAKE 1/2 TABLET EVERY DAY 45 tablet 0  . levalbuterol (XOPENEX HFA) 45 MCG/ACT inhaler Inhale 1-2 puffs into the lungs every 6 (six)  hours as needed for wheezing or shortness of breath. 1 Inhaler 1  . Loratadine 10 MG CAPS Take 1 capsule by mouth daily as needed (allergies).     . montelukast (SINGULAIR) 10 MG tablet Take 10 mg by mouth daily as needed (BREATHING).     . polyethylene glycol (MIRALAX / GLYCOLAX) packet Take 17 g by mouth daily as needed.    . sucralfate (CARAFATE) 1 G tablet TAKE 1 TABLET BY MOUTH AS DAILY NEEDED for stomach issues    . warfarin (COUMADIN) 5 MG tablet TAKE AS DIRECTED  BY  ANTICOAGULATION  CLINIC 135 tablet 1   No current facility-administered medications for this visit.     Past Medical History:  Diagnosis Date  . Allergic rhinitis    [name]  . Atrial flutter ([place])    A.  Status post cardioversion; B.  Tikosyn therapy - failed, remains in aflutter  . Bilateral pneumonia 11/02/2013   treated with levaquin  . BPH (benign prostatic hyperplasia)   . 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 Lucky Cowboy)  . Chronic sinusitis   . Chronic systolic congestive heart failure ([place])   . COPD (chronic obstructive pulmonary disease) ([place])    spirometry 2015 - no obstruction, + mild restrictive lung disease  . Extrinsic asthma    [name]  . GERD (gastroesophageal  reflux disease) 2010   h/o esophageal stricture with dilation, LA grade C reflux esophagitis by EGD 2010  . Goiter   . History of diverticulitis of colon   . History of GI bleed    Secondary to hemorrhoids  . IBS (irritable bowel syndrome)   . LBBB (left bundle branch block)   . Multiple pulmonary nodules 06/2013   RUL ([facility] Adam Phenix [facility]) - ?vasculitis as of last CT [facility]  . NICM (nonischemic cardiomyopathy) [facility])    Cardiac catheterization March 2006 without coronary disease; echocardiogram August 2008: EF 35%, mild AI, left atrial enlargement  . Porphyria [facility])     Past Surgical History:  Procedure Laterality Date  . BACK SURGERY  1997   bulging disks  . BiV-AICD implant     [employer]tronic  .  CARDIAC CATHETERIZATION  06/22/04   Severe, nonischemic cardiomyopathy,EF 25-30%  . CARDIOVERSION  02/22/09   AFlutter-([facility])  . CHOLECYSTECTOMY    . COLONOSCOPY  10/99   Divertic, splenic, hepatic fleure only  . COLONOSCOPY  10/21/08   aborted-divertics, int hemms (Dr. Vira Agar)  . CORONARY ANGIOPLASTY    . EP IMPLANTABLE DEVICE N/A 04/25/2015   Procedure: BIV ICD Generator Changeout;  Surgeon: Evans Lance, MD;  Location: [place] [facility];  Service: Cardiovascular;  Laterality: N/A;  . ESOPHAGEAL DILATION  02/18/98  . ESOPHAGOGASTRODUODENOSCOPY  01/1998   stricture, sliding HH, GERD  . ESOPHAGOGASTRODUODENOSCOPY  04/08/01   stricture, gastritis, HH, GERD-no dilation(Dr. Henrene Pastor)  . ESOPHAGOGASTRODUODENOSCOPY  12/22/04   stricture; gastritis; duodenitis, GERD  . ESOPHAGOGASTRODUODENOSCOPY  10/21/08   Reflux esophagitis; Erythem. Duod. (Dr. Vira Agar)  . ESOPHAGOGASTRODUODENOSCOPY  08/2013   erythema gastric fundus - minimal gastritis, HH ([facility])  . goiter removal    . HERNIA REPAIR    . HERNIA REPAIR  01/30/02   Dr. Hassell Done  . NOSE SURGERY  1971  . PENILE PROSTHESIS IMPLANT  2007   Otelin  . PFT  10/2013   FVC 61%, FEV1 63%, ratio 0.76  . Pulmonary eval  04/2002   ([facility]) Chronic congestive symptoms  . US ECHOCARDIOGRAPHY  07/13/04   EF 25-30%, Mod LVH; LA severe dilation; Mild AR; IRTR  . US ECHOCARDIOGRAPHY  11/27/06   hypokinesis posterior wall, EF 35%; mild AR    ROS:  Right shoulder pain and decreased mobility. .   Otherwise as stated in the HPI and negative for all other systems.  PHYSICAL EXAM BP 114/62   Pulse 78   Ht 5\' 6"  (1.676 m)   Wt 133 lb 12.8 oz (60.7 kg)   SpO2 98%   BMI 21.60 kg/m  GENERAL:  Well appearing NECK:  No jugular venous distention, waveform within normal limits, carotid upstroke brisk and symmetric, no bruits, no thyromegaly LUNGS:  Diffuse fine crackles CHEST:  Well healed ICD pocket HEART:  PMI not displaced or sustained,S1 and S2 within normal  limits, no S3, no clicks, no rubs, 2 out of 6 apical diastolic murmur short and nonradiating  ABD:  Flat, positive bowel sounds normal in frequency in pitch, no bruits, no rebound, no guarding, no midline pulsatile mass, no hepatomegaly, no splenomegaly EXT:  2 plus pulses throughout, no edema, no cyanosis no clubbing   ASSESSMENT AND PLAN  Nonischemic cardiomyopathy -  He seems to be euvolemic. No change in therapy is indicated.   I would like to order an echocardiogram prior to surgery but he'll let me know when this is going to happen.  He is thinking of having his shoulder replaced   Atrial flutter -  The patient tolerates this rhythm and rate control and anticoagulation. We will continue with the meds as listed. He does not want to switch from warfarin.  I will check a CBC  Pulmonary HTN - This was noted at the time of his last echo. However, he really have physical findings consistent with this her RV failure. He has no overt symptoms. No change in therapy is indicated.  I will check this with his upcoming echo.

## 2015-12-16 ENCOUNTER — Ambulatory Visit (INDEPENDENT_AMBULATORY_CARE_PROVIDER_SITE_OTHER): Payer: Medicare Other | Admitting: Cardiology

## 2015-12-16 ENCOUNTER — Encounter: Payer: Self-pay | Admitting: Cardiology

## 2015-12-16 VITALS — BP 114/62 | HR 78 | Ht 66.0 in | Wt 133.8 lb

## 2015-12-16 DIAGNOSIS — I4892 Unspecified atrial flutter: Secondary | ICD-10-CM

## 2015-12-16 DIAGNOSIS — I5022 Chronic systolic (congestive) heart failure: Secondary | ICD-10-CM

## 2015-12-16 DIAGNOSIS — Z79899 Other long term (current) drug therapy: Secondary | ICD-10-CM | POA: Diagnosis not present

## 2015-12-16 LAB — CBC
HCT: 36.3 % — ABNORMAL LOW (ref 38.5–50.0)
Hemoglobin: 12.4 g/dL — ABNORMAL LOW (ref 13.2–17.1)
MCH: 29.1 pg (ref 27.0–33.0)
MCHC: 34.2 g/dL (ref 32.0–36.0)
MCV: 85.2 fL (ref 80.0–100.0)
MPV: 10.3 fL (ref 7.5–12.5)
Platelets: 150 10*3/uL (ref 140–400)
RBC: 4.26 MIL/uL (ref 4.20–5.80)
RDW: 14.9 % (ref 11.0–15.0)
WBC: 9.3 10*3/uL (ref 3.8–10.8)

## 2015-12-16 NOTE — Patient Instructions (Signed)
Medication Instructions:  Continue current medications  Labwork: CBC  Testing/Procedures: None Ordered  Follow-Up: Your physician wants you to follow-up in: 1 Year. You will receive a reminder letter in the mail two months in advance. If you don't receive a letter, please call our office to schedule the follow-up appointment.   Any Other Special Instructions Will Be Listed Below (If Applicable).   If you need a refill on your cardiac medications before your next appointment, please call your pharmacy.   

## 2015-12-22 ENCOUNTER — Other Ambulatory Visit: Payer: Self-pay | Admitting: Surgery

## 2015-12-22 DIAGNOSIS — M75121 Complete rotator cuff tear or rupture of right shoulder, not specified as traumatic: Secondary | ICD-10-CM

## 2015-12-22 DIAGNOSIS — M7581 Other shoulder lesions, right shoulder: Secondary | ICD-10-CM

## 2015-12-23 ENCOUNTER — Other Ambulatory Visit: Payer: Self-pay

## 2015-12-23 ENCOUNTER — Telehealth: Payer: Self-pay | Admitting: Cardiology

## 2015-12-23 MED ORDER — ALPRAZOLAM 0.25 MG PO TABS: 0.2500 mg | ORAL_TABLET | Freq: Every day | ORAL | 0 refills | Status: DC

## 2015-12-23 NOTE — Telephone Encounter (Signed)
Spoke with patient wife and he is scheduled to have CT Arthrogram of shoulder. Patient was advised needed to be off Warfarin for 5 days prior. Procedure scheduled for 12/28/15 Will forward to Dr Ellyn Hack DOD for review

## 2015-12-23 NOTE — Telephone Encounter (Signed)
Please call,question about stopping his Coumadin for surgery.

## 2015-12-23 NOTE — Telephone Encounter (Signed)
Rx called in as directed.   

## 2015-12-23 NOTE — Telephone Encounter (Signed)
error 

## 2015-12-23 NOTE — Telephone Encounter (Signed)
V/M left (no name given) requesting refill xanax to [facility]. Last refilled # 60 on 08/01/15 and last annual exam on 04/12/15

## 2015-12-23 NOTE — Telephone Encounter (Signed)
Routed to Claiborne Billings D for review

## 2015-12-23 NOTE — Telephone Encounter (Signed)
plz phone in. 

## 2015-12-23 NOTE — Telephone Encounter (Signed)
CHADS2 score of 2 for age and CHF.  Per protocol ok to hold warfarin x 5 days (starting tonight if possible) for procedure on Sept 20.  Would recommend to restart warfarin night of Sept 20 or Sept 21 at discretion of physician performing procedure

## 2015-12-23 NOTE — Telephone Encounter (Signed)
Advised wife  

## 2015-12-23 NOTE — Telephone Encounter (Signed)
Agree. Kahlani Graber, MD  

## 2015-12-28 ENCOUNTER — Ambulatory Visit
Admission: RE | Admit: 2015-12-28 | Discharge: 2015-12-28 | Disposition: A | Discharge status: Home / Self Care | Payer: Medicare Other | Source: Ambulatory Visit | Attending: Surgery | Admitting: Surgery

## 2015-12-28 DIAGNOSIS — S46011A Strain of muscle(s) and tendon(s) of the rotator cuff of right shoulder, initial encounter: Secondary | ICD-10-CM | POA: Diagnosis not present

## 2015-12-28 DIAGNOSIS — M25511 Pain in right shoulder: Secondary | ICD-10-CM | POA: Diagnosis not present

## 2015-12-28 DIAGNOSIS — M75121 Complete rotator cuff tear or rupture of right shoulder, not specified as traumatic: Secondary | ICD-10-CM | POA: Diagnosis not present

## 2015-12-28 DIAGNOSIS — M659 Synovitis and tenosynovitis, unspecified: Secondary | ICD-10-CM | POA: Diagnosis not present

## 2015-12-28 DIAGNOSIS — M7581 Other shoulder lesions, right shoulder: Secondary | ICD-10-CM

## 2015-12-28 DIAGNOSIS — M19011 Primary osteoarthritis, right shoulder: Secondary | ICD-10-CM | POA: Diagnosis not present

## 2015-12-28 DIAGNOSIS — J984 Other disorders of lung: Secondary | ICD-10-CM | POA: Insufficient documentation

## 2015-12-28 MED ORDER — SODIUM CHLORIDE 0.9 % IJ SOLN
6.0000 mL | INTRAMUSCULAR | Status: DC | PRN
  Administered 2015-12-28: 3 mL via INTRAVENOUS
  Filled 2015-12-28: qty 10

## 2015-12-28 MED ORDER — IOPAMIDOL (ISOVUE-300) INJECTION 61%
15.0000 mL | Freq: Once | INTRAVENOUS | Status: AC | PRN
  Administered 2015-12-28: 9 mL via INTRAVENOUS

## 2015-12-30 DIAGNOSIS — Z01811 Encounter for preprocedural respiratory examination: Secondary | ICD-10-CM | POA: Diagnosis not present

## 2015-12-30 DIAGNOSIS — R0600 Dyspnea, unspecified: Secondary | ICD-10-CM | POA: Diagnosis not present

## 2015-12-30 DIAGNOSIS — R918 Other nonspecific abnormal finding of lung field: Secondary | ICD-10-CM | POA: Diagnosis not present

## 2016-01-02 ENCOUNTER — Other Ambulatory Visit: Payer: Self-pay | Admitting: Cardiology

## 2016-01-02 NOTE — Telephone Encounter (Signed)
Rx request sent to pharmacy.  

## 2016-01-04 ENCOUNTER — Ambulatory Visit (INDEPENDENT_AMBULATORY_CARE_PROVIDER_SITE_OTHER): Payer: Medicare Other

## 2016-01-04 DIAGNOSIS — I4892 Unspecified atrial flutter: Secondary | ICD-10-CM

## 2016-01-04 DIAGNOSIS — Z7901 Long term (current) use of anticoagulants: Secondary | ICD-10-CM | POA: Diagnosis not present

## 2016-01-04 LAB — POCT INR: INR: 1.5

## 2016-01-06 ENCOUNTER — Telehealth: Payer: Self-pay | Admitting: Cardiology

## 2016-01-06 ENCOUNTER — Encounter
Admission: RE | Admit: 2016-01-06 | Discharge: 2016-01-06 | Disposition: A | Discharge status: Home / Self Care | Payer: Medicare Other | Source: Ambulatory Visit | Attending: Surgery | Admitting: Surgery

## 2016-01-06 DIAGNOSIS — Z01818 Encounter for other preprocedural examination: Secondary | ICD-10-CM | POA: Diagnosis not present

## 2016-01-06 HISTORY — DX: Presence of cardiac pacemaker: Z95.0

## 2016-01-06 HISTORY — DX: Anxiety disorder, unspecified: F41.9

## 2016-01-06 HISTORY — DX: Unspecified osteoarthritis, unspecified site: M19.90

## 2016-01-06 HISTORY — DX: Anemia, unspecified: D64.9

## 2016-01-06 HISTORY — DX: Presence of automatic (implantable) cardiac defibrillator: Z95.810

## 2016-01-06 LAB — BASIC METABOLIC PANEL
Anion gap: 4 — ABNORMAL LOW (ref 5–15)
BUN: 16 mg/dL (ref 6–20)
CO2: 30 mmol/L (ref 22–32)
Calcium: 9.2 mg/dL (ref 8.9–10.3)
Chloride: 105 mmol/L (ref 101–111)
Creatinine, Ser: 0.85 mg/dL (ref 0.61–1.24)
GFR calc Af Amer: >60 mL/min (ref >60)
GFR calc non Af Amer: >60 mL/min (ref >60)
Glucose, Bld: 100 mg/dL — ABNORMAL HIGH (ref 65–99)
Potassium: 4.6 mmol/L (ref 3.5–5.1)
Sodium: 139 mmol/L (ref 135–145)

## 2016-01-06 LAB — PROTIME-INR
INR: 1.63
Prothrombin Time: 19.5 seconds — ABNORMAL HIGH (ref 11.4–15.2)

## 2016-01-06 LAB — TYPE AND SCREEN
ABO/RH(D): A POS
Antibody Screen: NEGATIVE

## 2016-01-06 LAB — CBC
HCT: 38.2 % — ABNORMAL LOW (ref 40.0–52.0)
Hemoglobin: 12.9 g/dL — ABNORMAL LOW (ref 13.0–18.0)
MCH: 29.6 pg (ref 26.0–34.0)
MCHC: 33.9 g/dL (ref 32.0–36.0)
MCV: 87.5 fL (ref 80.0–100.0)
Platelets: 119 10*3/uL — ABNORMAL LOW (ref 150–440)
RBC: 4.37 MIL/uL — ABNORMAL LOW (ref 4.40–5.90)
RDW: 15 % — ABNORMAL HIGH (ref 11.5–14.5)
WBC: 5.4 10*3/uL (ref 3.8–10.6)

## 2016-01-06 LAB — URINALYSIS COMPLETE WITH MICROSCOPIC (ARMC ONLY)
Bacteria, UA: NONE SEEN
Bilirubin Urine: NEGATIVE
Glucose, UA: NEGATIVE mg/dL
Hgb urine dipstick: NEGATIVE
Ketones, ur: NEGATIVE mg/dL
Leukocytes, UA: NEGATIVE
Nitrite: NEGATIVE
Protein, ur: NEGATIVE mg/dL
Specific Gravity, Urine: 1.015 (ref 1.005–1.030)
Squamous Epithelial / LPF: NONE SEEN
pH: 5 (ref 5.0–8.0)

## 2016-01-06 LAB — SURGICAL PCR SCREEN
MRSA, PCR: NEGATIVE
Staphylococcus aureus: NEGATIVE

## 2016-01-06 NOTE — Telephone Encounter (Signed)
Per office protocol okay to hold x 5 days for procedure.  Restart afterward at discretion of surgeon.  Patient aware.

## 2016-01-06 NOTE — Telephone Encounter (Signed)
New message   Clearance is not showing up in clearance.    Request for surgical clearance:  1. What type of surgery is being performed? Right shoulder   2. When is this surgery scheduled? 10.10.2017   3. Are there any medications that need to be held prior to surgery and how long? Was adresss already   4. Name of physician performing surgery? Dr. Roland Rack  5. What is your office phone and fax number? [phone] /  fax -[phone]

## 2016-01-06 NOTE — Telephone Encounter (Signed)
Routed to MD & coumadin clinic to advice for upcoming shoulder arthroplasty.

## 2016-01-06 NOTE — Patient Instructions (Signed)
  Your procedure is scheduled on: January 17, 2016 (Tuesday) Report to Same Day Surgery 2nd floor Medical Mall To find out your arrival time please call [phone] between 1PM - 3PM on  January 16, 2016 (Monday)  Remember: Instructions that are not followed completely may result in serious medical risk, up to and including death, or upon the discretion of your surgeon and anesthesiologist your surgery may need to be rescheduled.    _x___ 1. Do not eat food or drink liquids after midnight. No gum chewing or hard candies.     _x    __ 2. No Alcohol for 24 hours before or after surgery.   _x_   __3. No Smoking for 24 prior to surgery.   ____  4. Bring all medications with you on the day of surgery if instructed.    __x__ 5. Notify your doctor if there is any change in your medical condition     (cold, fever, infections).     Do not wear jewelry, make-up, hairpins, clips or nail polish.  Do not wear lotions, powders, or perfumes. You may wear deodorant.  Do not shave 48 hours prior to surgery. Men may shave face and neck.  Do not bring valuables to the hospital.    [facility] is not responsible for any belongings or valuables.               Contacts, dentures or bridgework may not be worn into surgery.  Leave your suitcase in the car. After surgery it may be brought to your room.  For patients admitted to the hospital, discharge time is determined by your treatment team.   Patients discharged the day of surgery will not be allowed to drive home.    Please read over the following fact sheets that you were given:   [facility] Preparing for Surgery and or MRSA Information   _x___ Take these medicines the morning of surgery with A SIP OF WATER:    1. Carvedilol  2. Enalapril  3.  4.  5.  6.  ____Fleets enema or Magnesium Citrate as directed.   _x___ Use CHG Soap or sage wipes as directed on instruction sheet   _x___ Use inhalers on the day of surgery and bring to hospital  day of surgery (Use Xopenex the morning of surgery and bring to hospital)  ____ Stop metformin 2 days prior to surgery    ____ Take 1/2 of usual insulin dose the night before surgery and none on the morning of           surgery.   _x__ Stop aspirin or coumadin, or plavix (Patient instructed to stop warfarin on October 5 by the Coumadin clinic)  x__ Stop Anti-inflammatories such as Advil, Aleve, Ibuprofen, Motrin, Naproxen,          Naprosyn, Goodies powders or aspirin products. Ok to take Tylenol.   ____ Stop supplements until after surgery.    ____ Bring C-Pap to the hospital.

## 2016-01-07 LAB — URINE CULTURE: Culture: NO GROWTH

## 2016-01-07 NOTE — Telephone Encounter (Signed)
The patient would be at acceptable risk for the planned procedure.

## 2016-01-08 HISTORY — PX: REVERSE TOTAL SHOULDER ARTHROPLASTY: SHX2344

## 2016-01-09 NOTE — Telephone Encounter (Signed)
Clearance faxed via Epic and faxed Machine 

## 2016-01-17 ENCOUNTER — Inpatient Hospital Stay: Payer: Medicare Other | Admitting: Anesthesiology

## 2016-01-17 ENCOUNTER — Inpatient Hospital Stay: Payer: Medicare Other

## 2016-01-17 ENCOUNTER — Inpatient Hospital Stay
Admission: RE | Admit: 2016-01-17 | Discharge: 2016-01-18 | DRG: 483 | Disposition: A | Discharge status: Home Health | Payer: Medicare Other | Source: Ambulatory Visit | Attending: Surgery | Admitting: Surgery

## 2016-01-17 ENCOUNTER — Encounter
Admission: RE | Disposition: A | Discharge status: Home Health | Payer: Self-pay | Source: Ambulatory Visit | Attending: Surgery

## 2016-01-17 DIAGNOSIS — Z7901 Long term (current) use of anticoagulants: Secondary | ICD-10-CM

## 2016-01-17 DIAGNOSIS — J449 Chronic obstructive pulmonary disease, unspecified: Secondary | ICD-10-CM | POA: Diagnosis present

## 2016-01-17 DIAGNOSIS — I509 Heart failure, unspecified: Secondary | ICD-10-CM | POA: Diagnosis not present

## 2016-01-17 DIAGNOSIS — R262 Difficulty in walking, not elsewhere classified: Secondary | ICD-10-CM

## 2016-01-17 DIAGNOSIS — M75121 Complete rotator cuff tear or rupture of right shoulder, not specified as traumatic: Secondary | ICD-10-CM | POA: Diagnosis not present

## 2016-01-17 DIAGNOSIS — I739 Peripheral vascular disease, unspecified: Secondary | ICD-10-CM | POA: Diagnosis not present

## 2016-01-17 DIAGNOSIS — M75122 Complete rotator cuff tear or rupture of left shoulder, not specified as traumatic: Secondary | ICD-10-CM | POA: Diagnosis not present

## 2016-01-17 DIAGNOSIS — Z96611 Presence of right artificial shoulder joint: Secondary | ICD-10-CM

## 2016-01-17 DIAGNOSIS — I5022 Chronic systolic (congestive) heart failure: Secondary | ICD-10-CM | POA: Diagnosis present

## 2016-01-17 DIAGNOSIS — Z95810 Presence of automatic (implantable) cardiac defibrillator: Secondary | ICD-10-CM

## 2016-01-17 DIAGNOSIS — Z79899 Other long term (current) drug therapy: Secondary | ICD-10-CM

## 2016-01-17 DIAGNOSIS — M7591 Shoulder lesion, unspecified, right shoulder: Secondary | ICD-10-CM | POA: Diagnosis not present

## 2016-01-17 DIAGNOSIS — M25511 Pain in right shoulder: Secondary | ICD-10-CM

## 2016-01-17 DIAGNOSIS — M6281 Muscle weakness (generalized): Secondary | ICD-10-CM

## 2016-01-17 DIAGNOSIS — M75101 Unspecified rotator cuff tear or rupture of right shoulder, not specified as traumatic: Secondary | ICD-10-CM | POA: Diagnosis not present

## 2016-01-17 DIAGNOSIS — M19011 Primary osteoarthritis, right shoulder: Secondary | ICD-10-CM | POA: Diagnosis present

## 2016-01-17 DIAGNOSIS — Z471 Aftercare following joint replacement surgery: Secondary | ICD-10-CM | POA: Diagnosis not present

## 2016-01-17 HISTORY — PX: REVERSE SHOULDER ARTHROPLASTY: SHX5054

## 2016-01-17 LAB — PROTIME-INR
INR: 1.08
Prothrombin Time: 14 seconds (ref 11.4–15.2)

## 2016-01-17 LAB — ABO/RH: ABO/RH(D): A POS

## 2016-01-17 SURGERY — ARTHROPLASTY, SHOULDER, TOTAL, REVERSE
Anesthesia: General | Laterality: Right

## 2016-01-17 MED ORDER — FENTANYL CITRATE (PF) 100 MCG/2ML IJ SOLN
INTRAMUSCULAR | Status: DC | PRN
  Administered 2016-01-17 (×2): 50 ug via INTRAVENOUS

## 2016-01-17 MED ORDER — FUROSEMIDE 20 MG PO TABS
10.0000 mg | ORAL_TABLET | Freq: Every day | ORAL | Status: DC
  Administered 2016-01-17 – 2016-01-18 (×2): 10 mg via ORAL
  Filled 2016-01-17 (×2): qty 1

## 2016-01-17 MED ORDER — ONDANSETRON HCL 4 MG/2ML IJ SOLN: 4.0000 mg | Freq: Once | INTRAMUSCULAR | Status: DC | PRN

## 2016-01-17 MED ORDER — SUGAMMADEX SODIUM 200 MG/2ML IV SOLN
INTRAVENOUS | Status: DC | PRN
  Administered 2016-01-17: 125 mg via INTRAVENOUS

## 2016-01-17 MED ORDER — MONTELUKAST SODIUM 10 MG PO TABS: 10.0000 mg | ORAL_TABLET | Freq: Every day | ORAL | Status: DC | PRN

## 2016-01-17 MED ORDER — FLUTICASONE PROPIONATE 50 MCG/ACT NA SUSP
1.0000 | Freq: Every day | NASAL | Status: DC
  Administered 2016-01-17 – 2016-01-18 (×2): 1 via NASAL
  Filled 2016-01-17: qty 16

## 2016-01-17 MED ORDER — WARFARIN SODIUM 7.5 MG PO TABS: 7.5000 mg | ORAL_TABLET | Freq: Once | ORAL | Status: DC

## 2016-01-17 MED ORDER — MAGNESIUM HYDROXIDE 400 MG/5ML PO SUSP: 30.0000 mL | Freq: Every day | ORAL | Status: DC | PRN

## 2016-01-17 MED ORDER — METOCLOPRAMIDE HCL 5 MG/ML IJ SOLN: 5.0000 mg | Freq: Three times a day (TID) | INTRAMUSCULAR | Status: DC | PRN

## 2016-01-17 MED ORDER — LIDOCAINE HCL (CARDIAC) 20 MG/ML IV SOLN
INTRAVENOUS | Status: DC | PRN
  Administered 2016-01-17: 50 mg via INTRAVENOUS

## 2016-01-17 MED ORDER — ENALAPRIL MALEATE 5 MG PO TABS
5.0000 mg | ORAL_TABLET | Freq: Two times a day (BID) | ORAL | Status: DC
  Administered 2016-01-17: 5 mg via ORAL
  Filled 2016-01-17 (×2): qty 1

## 2016-01-17 MED ORDER — ENOXAPARIN SODIUM 40 MG/0.4ML ~~LOC~~ SOLN
40.0000 mg | SUBCUTANEOUS | Status: DC
  Administered 2016-01-18: 40 mg via SUBCUTANEOUS
  Filled 2016-01-17: qty 0.4

## 2016-01-17 MED ORDER — OXYCODONE HCL 5 MG PO TABS
5.0000 mg | ORAL_TABLET | ORAL | Status: DC | PRN
  Administered 2016-01-17 (×2): 5 mg via ORAL
  Administered 2016-01-17 – 2016-01-18 (×2): 10 mg via ORAL
  Filled 2016-01-17: qty 1
  Filled 2016-01-17: qty 2
  Filled 2016-01-17: qty 1
  Filled 2016-01-17: qty 2

## 2016-01-17 MED ORDER — ALUM & MAG HYDROXIDE-SIMETH 200-200-20 MG/5ML PO SUSP: 15.0000 mL | Freq: Every day | ORAL | Status: DC | PRN

## 2016-01-17 MED ORDER — POLYETHYLENE GLYCOL 3350 17 G PO PACK: 17.0000 g | PACK | Freq: Every day | ORAL | Status: DC | PRN

## 2016-01-17 MED ORDER — ONDANSETRON HCL 4 MG PO TABS: 4.0000 mg | ORAL_TABLET | Freq: Four times a day (QID) | ORAL | Status: DC | PRN

## 2016-01-17 MED ORDER — ALBUTEROL SULFATE (2.5 MG/3ML) 0.083% IN NEBU: 2.5000 mg | INHALATION_SOLUTION | Freq: Four times a day (QID) | RESPIRATORY_TRACT | Status: DC | PRN

## 2016-01-17 MED ORDER — CARVEDILOL 6.25 MG PO TABS
6.2500 mg | ORAL_TABLET | Freq: Two times a day (BID) | ORAL | Status: DC
  Filled 2016-01-17: qty 1

## 2016-01-17 MED ORDER — LORATADINE 10 MG PO TABS: 10.0000 mg | ORAL_TABLET | Freq: Every day | ORAL | Status: DC | PRN

## 2016-01-17 MED ORDER — ETOMIDATE 2 MG/ML IV SOLN
INTRAVENOUS | Status: DC | PRN
  Administered 2016-01-17: 15 mg via INTRAVENOUS

## 2016-01-17 MED ORDER — FLUTICASONE PROPIONATE 50 MCG/ACT NA SUSP: 2.0000 | Freq: Every day | NASAL | Status: DC

## 2016-01-17 MED ORDER — ACETAMINOPHEN 650 MG RE SUPP: 650.0000 mg | Freq: Four times a day (QID) | RECTAL | Status: DC | PRN

## 2016-01-17 MED ORDER — NEOMYCIN-POLYMYXIN B GU 40-200000 IR SOLN
Status: AC
  Filled 2016-01-17: qty 20

## 2016-01-17 MED ORDER — BUPIVACAINE-EPINEPHRINE (PF) 0.5% -1:200000 IJ SOLN
INTRAMUSCULAR | Status: DC | PRN
  Administered 2016-01-17: 30 mL

## 2016-01-17 MED ORDER — BUPIVACAINE LIPOSOME 1.3 % IJ SUSP
INTRAMUSCULAR | Status: DC | PRN
  Administered 2016-01-17: 60 mL

## 2016-01-17 MED ORDER — BUPIVACAINE LIPOSOME 1.3 % IJ SUSP
INTRAMUSCULAR | Status: AC
  Filled 2016-01-17: qty 20

## 2016-01-17 MED ORDER — ACETAMINOPHEN 325 MG PO TABS: 650.0000 mg | ORAL_TABLET | Freq: Four times a day (QID) | ORAL | Status: DC | PRN

## 2016-01-17 MED ORDER — FENTANYL CITRATE (PF) 100 MCG/2ML IJ SOLN
INTRAMUSCULAR | Status: AC
  Administered 2016-01-17: 25 ug via INTRAVENOUS
  Filled 2016-01-17: qty 2

## 2016-01-17 MED ORDER — DOCUSATE SODIUM 100 MG PO CAPS
100.0000 mg | ORAL_CAPSULE | Freq: Two times a day (BID) | ORAL | Status: DC
  Administered 2016-01-17 – 2016-01-18 (×2): 100 mg via ORAL
  Filled 2016-01-17 (×2): qty 1

## 2016-01-17 MED ORDER — METOCLOPRAMIDE HCL 10 MG PO TABS: 5.0000 mg | ORAL_TABLET | Freq: Three times a day (TID) | ORAL | Status: DC | PRN

## 2016-01-17 MED ORDER — DIPHENHYDRAMINE HCL 12.5 MG/5ML PO ELIX: 12.5000 mg | ORAL_SOLUTION | ORAL | Status: DC | PRN

## 2016-01-17 MED ORDER — PANTOPRAZOLE SODIUM 40 MG PO TBEC
40.0000 mg | DELAYED_RELEASE_TABLET | Freq: Every day | ORAL | Status: DC
  Administered 2016-01-18: 40 mg via ORAL
  Filled 2016-01-17: qty 1

## 2016-01-17 MED ORDER — ONDANSETRON HCL 4 MG/2ML IJ SOLN
INTRAMUSCULAR | Status: DC | PRN
  Administered 2016-01-17: 4 mg via INTRAVENOUS

## 2016-01-17 MED ORDER — FENTANYL CITRATE (PF) 100 MCG/2ML IJ SOLN
25.0000 ug | INTRAMUSCULAR | Status: DC | PRN
  Administered 2016-01-17 (×4): 25 ug via INTRAVENOUS

## 2016-01-17 MED ORDER — VANCOMYCIN HCL IN DEXTROSE 1-5 GM/200ML-% IV SOLN
1000.0000 mg | Freq: Once | INTRAVENOUS | Status: AC
  Administered 2016-01-17: 1000 mg via INTRAVENOUS

## 2016-01-17 MED ORDER — KETAMINE HCL 50 MG/ML IJ SOLN
INTRAMUSCULAR | Status: DC | PRN
  Administered 2016-01-17: 25 mg via INTRAVENOUS

## 2016-01-17 MED ORDER — WARFARIN - PHARMACIST DOSING INPATIENT: Freq: Every day | Status: DC

## 2016-01-17 MED ORDER — SUCRALFATE 1 G PO TABS: 1.0000 g | ORAL_TABLET | Freq: Every day | ORAL | Status: DC | PRN

## 2016-01-17 MED ORDER — BUPIVACAINE-EPINEPHRINE (PF) 0.5% -1:200000 IJ SOLN
INTRAMUSCULAR | Status: AC
  Filled 2016-01-17: qty 30

## 2016-01-17 MED ORDER — WARFARIN SODIUM 5 MG PO TABS: 5.0000 mg | ORAL_TABLET | Freq: Every day | ORAL | Status: DC

## 2016-01-17 MED ORDER — ACETAMINOPHEN 500 MG PO TABS
1000.0000 mg | ORAL_TABLET | Freq: Four times a day (QID) | ORAL | Status: AC
  Administered 2016-01-17 – 2016-01-18 (×2): 1000 mg via ORAL
  Filled 2016-01-17 (×2): qty 2

## 2016-01-17 MED ORDER — LACTATED RINGERS IV SOLN
INTRAVENOUS | Status: DC
  Administered 2016-01-17: 13:00:00 via INTRAVENOUS

## 2016-01-17 MED ORDER — HYDROMORPHONE HCL 1 MG/ML IJ SOLN
0.2500 mg | INTRAMUSCULAR | Status: DC | PRN
  Administered 2016-01-17 (×4): 0.25 mg via INTRAVENOUS

## 2016-01-17 MED ORDER — NEOMYCIN-POLYMYXIN B GU 40-200000 IR SOLN
Status: DC | PRN
  Administered 2016-01-17: 16 mL

## 2016-01-17 MED ORDER — CYANOCOBALAMIN 1000 MCG/ML IJ SOLN: 1000.0000 ug | INTRAMUSCULAR | Status: DC

## 2016-01-17 MED ORDER — ONDANSETRON HCL 4 MG/2ML IJ SOLN: 4.0000 mg | Freq: Four times a day (QID) | INTRAMUSCULAR | Status: DC | PRN

## 2016-01-17 MED ORDER — HYDROMORPHONE HCL 1 MG/ML IJ SOLN
INTRAMUSCULAR | Status: AC
  Administered 2016-01-17: 0.25 mg via INTRAVENOUS
  Filled 2016-01-17: qty 1

## 2016-01-17 MED ORDER — VITAMIN B-12 1000 MCG PO TABS
1000.0000 ug | ORAL_TABLET | Freq: Every day | ORAL | Status: DC
  Administered 2016-01-17 – 2016-01-18 (×2): 1000 ug via ORAL
  Filled 2016-01-17 (×2): qty 1

## 2016-01-17 MED ORDER — FLEET ENEMA 7-19 GM/118ML RE ENEM: 1.0000 | ENEMA | Freq: Once | RECTAL | Status: DC | PRN

## 2016-01-17 MED ORDER — METHYLPREDNISOLONE SODIUM SUCC 125 MG IJ SOLR
INTRAMUSCULAR | Status: DC | PRN
  Administered 2016-01-17: 125 mg via INTRAVENOUS

## 2016-01-17 MED ORDER — VANCOMYCIN HCL IN DEXTROSE 1-5 GM/200ML-% IV SOLN
1000.0000 mg | Freq: Two times a day (BID) | INTRAVENOUS | Status: AC
  Administered 2016-01-17: 1000 mg via INTRAVENOUS
  Filled 2016-01-17: qty 200

## 2016-01-17 MED ORDER — SODIUM CHLORIDE 0.9 % IJ SOLN
INTRAMUSCULAR | Status: AC
  Filled 2016-01-17: qty 50

## 2016-01-17 MED ORDER — TRANEXAMIC ACID 1000 MG/10ML IV SOLN
INTRAVENOUS | Status: AC
  Filled 2016-01-17: qty 10

## 2016-01-17 MED ORDER — GUAIFENESIN 100 MG/5ML PO SOLN
400.0000 mg | ORAL | Status: DC | PRN
  Filled 2016-01-17: qty 20

## 2016-01-17 MED ORDER — WARFARIN SODIUM 5 MG PO TABS
7.5000 mg | ORAL_TABLET | Freq: Once | ORAL | Status: AC
  Administered 2016-01-17: 7.5 mg via ORAL
  Filled 2016-01-17: qty 1

## 2016-01-17 MED ORDER — ALPRAZOLAM 0.25 MG PO TABS
0.2500 mg | ORAL_TABLET | Freq: Every day | ORAL | Status: DC
  Administered 2016-01-17: 0.25 mg via ORAL
  Filled 2016-01-17: qty 1

## 2016-01-17 MED ORDER — ROCURONIUM BROMIDE 100 MG/10ML IV SOLN
INTRAVENOUS | Status: DC | PRN
  Administered 2016-01-17: 40 mg via INTRAVENOUS

## 2016-01-17 MED ORDER — VANCOMYCIN HCL IN DEXTROSE 1-5 GM/200ML-% IV SOLN
INTRAVENOUS | Status: AC
  Administered 2016-01-17: 1000 mg via INTRAVENOUS
  Filled 2016-01-17: qty 200

## 2016-01-17 MED ORDER — BISACODYL 10 MG RE SUPP: 10.0000 mg | Freq: Every day | RECTAL | Status: DC | PRN

## 2016-01-17 MED ORDER — KCL IN DEXTROSE-NACL 20-5-0.9 MEQ/L-%-% IV SOLN
INTRAVENOUS | Status: DC
  Administered 2016-01-17: 19:00:00 via INTRAVENOUS
  Filled 2016-01-17 (×3): qty 1000

## 2016-01-17 SURGICAL SUPPLY — 60 items
BIT DRILL TWIST 2.7 (BIT) ×2 IMPLANT
BLADE SAGITTAL WIDE XTHICK NO (BLADE) IMPLANT
CANISTER SUCT 1200ML W/VALVE (MISCELLANEOUS) ×2 IMPLANT
CANISTER SUCT 3000ML PPV (MISCELLANEOUS) ×4 IMPLANT
CAPT SHLDR REVTOTAL 2 ×2 IMPLANT
CATH TRAY METER 16FR LF (MISCELLANEOUS) ×2 IMPLANT
CHLORAPREP W/TINT 26ML (MISCELLANEOUS) ×4 IMPLANT
COOLER POLAR GLACIER W/PUMP (MISCELLANEOUS) ×2 IMPLANT
CRADLE LAMINECT ARM (MISCELLANEOUS) ×2 IMPLANT
DRAPE IMP U-DRAPE 54X76 (DRAPES) ×4 IMPLANT
DRAPE INCISE IOBAN 66X45 STRL (DRAPES) ×4 IMPLANT
DRAPE INCISE IOBAN 66X60 STRL (DRAPES) ×2 IMPLANT
DRAPE SHEET LG 3/4 BI-LAMINATE (DRAPES) ×4 IMPLANT
DRAPE TABLE BACK 80X90 (DRAPES) ×2 IMPLANT
DRSG OPSITE POSTOP 4X8 (GAUZE/BANDAGES/DRESSINGS) ×2 IMPLANT
ELECT CAUTERY BLADE 6.4 (BLADE) ×2 IMPLANT
GLOVE BIO SURGEON STRL SZ7.5 (GLOVE) ×4 IMPLANT
GLOVE BIO SURGEON STRL SZ8 (GLOVE) ×4 IMPLANT
GLOVE BIOGEL PI IND STRL 8 (GLOVE) ×5 IMPLANT
GLOVE BIOGEL PI INDICATOR 8 (GLOVE) ×5
GLOVE INDICATOR 8.0 STRL GRN (GLOVE) ×2 IMPLANT
GOWN STRL REUS W/ TWL LRG LVL3 (GOWN DISPOSABLE) ×2 IMPLANT
GOWN STRL REUS W/ TWL XL LVL3 (GOWN DISPOSABLE) ×1 IMPLANT
GOWN STRL REUS W/TWL LRG LVL3 (GOWN DISPOSABLE) ×4
GOWN STRL REUS W/TWL XL LVL3 (GOWN DISPOSABLE) ×1
HANDPIECE INTERPULSE COAX TIP (DISPOSABLE) ×1
HOOD PEEL AWAY FLYTE STAYCOOL (MISCELLANEOUS) ×6 IMPLANT
KIT RM TURNOVER STRD PROC AR (KITS) ×2 IMPLANT
KIT STABILIZATION SHOULDER (MISCELLANEOUS) ×2 IMPLANT
MASK FACE SPIDER DISP (MASK) ×2 IMPLANT
NDL MAYO CATGUT SZ1 (NEEDLE)
NDL MAYO CATGUT SZ5 (NEEDLE)
NDL SAFETY 22GX1.5 (NEEDLE) IMPLANT
NDL SUT 5 .5 CRC TPR PNT MAYO (NEEDLE) IMPLANT
NEEDLE 18GX1X1/2 (RX/OR ONLY) (NEEDLE) IMPLANT
NEEDLE HYPO 25X1 1.5 SAFETY (NEEDLE) ×2 IMPLANT
NEEDLE MAYO CATGUT SZ1 (NEEDLE) IMPLANT
NEEDLE MAYO CATGUT SZ4 (NEEDLE) IMPLANT
NEEDLE SPNL 20GX3.5 QUINCKE YW (NEEDLE) ×2 IMPLANT
NS IRRIG 1000ML POUR BTL (IV SOLUTION) ×2 IMPLANT
PACK ARTHROSCOPY SHOULDER (MISCELLANEOUS) ×2 IMPLANT
PAD WRAPON POLAR SHDR UNIV (MISCELLANEOUS) ×1 IMPLANT
PIN THREADED REVERSE (PIN) ×2 IMPLANT
SET HNDPC FAN SPRY TIP SCT (DISPOSABLE) ×1 IMPLANT
SLING ULTRA II M (MISCELLANEOUS) ×2 IMPLANT
SOL .9 NS 3000ML IRR  AL (IV SOLUTION) ×1
SOL .9 NS 3000ML IRR UROMATIC (IV SOLUTION) ×1 IMPLANT
SPONGE LAP 18X18 5 PK (GAUZE/BANDAGES/DRESSINGS) ×2 IMPLANT
STAPLER SKIN PROX 35W (STAPLE) ×2 IMPLANT
SUT ETHIBOND 0 MO6 C/R (SUTURE) ×2 IMPLANT
SUT ETHIBOND NAB CT1 #1 30IN (SUTURE) ×2 IMPLANT
SUT FIBERWIRE #2 38 BLUE 1/2 (SUTURE) ×2
SUT VIC AB 0 CT1 36 (SUTURE) ×4 IMPLANT
SUT VIC AB 2-0 CT1 27 (SUTURE) ×2
SUT VIC AB 2-0 CT1 TAPERPNT 27 (SUTURE) ×2 IMPLANT
SUTURE FIBERWR #2 38 BLUE 1/2 (SUTURE) ×1 IMPLANT
SYR 30ML LL (SYRINGE) ×4 IMPLANT
SYRINGE 10CC LL (SYRINGE) ×2 IMPLANT
TUBE CONNECTING 6X3/16 (MISCELLANEOUS) ×2 IMPLANT
WRAPON POLAR PAD SHDR UNIV (MISCELLANEOUS) ×2

## 2016-01-17 NOTE — Op Note (Signed)
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  Post-Op Diagnosis:   Same.  Procedure:   Reverse right total shoulder arthroplasty.  Surgeon:   Pascal Lux, MD  Assistant:   Cameron Proud, PA-C; Donnie Coffin, PA-S  Anesthesia:   GET  Findings:   As above.  Complications:   None  EBL:  150 cc  Fluids:   800 cc crystalloid  UOP:  75 cc  TT:   None  Drains:   None  Closure:   Staples  Implants:   All press-fit Biomet Comprehensive system with a #12 mini-humeral stem, a 44 mm humeral tray with a standard insert, and a mini-base plate with a 36 mm glenosphere (+3 mm).  Brief Clinical Note:   The patient is an 82 year old male with a long history of gradually worsening right shoulder pain and weakness. His symptoms have progressed despite medications, activity modification, etc. His history and examination are consistent with a massive rotator cuff tear with progressive cuff arthropathy, all of which were confirmed by an arthro-CT scan. The patient presents at this time for a reverse right total shoulder arthroplasty.  Procedure:   The patient was brought into the operating room and lain in the supine position on the OR table. The patient then underwent general endotracheal intubation and anesthesia before a Foley catheter was inserted and the patient repositioned in the beach chair position using the beach chair positioner. The right shoulder and upper extremity were prepped with ChloraPrep solution before being draped sterilely. Preoperative antibiotics were administered. A standard anterior approach to the shoulder was made through an approximately 4-5 inch incision. The incision was carried down through the subcutaneous tissues to expose the deltopectoral fascia. The interval between the deltoid and pectoralis muscles was identified and this plane developed,  retracting the cephalic vein laterally with the deltoid muscle. The conjoined tendon was identified. Its lateral margin was dissected and the Kolbel self-retraining retractor inserted. The "three sisters" were identified and cauterized. Bursal tissues were removed to improve visualization. The subscapularis tendon was released from its attachment to the lesser tuberosity 1 cm proximal to its insertion and several tagging sutures placed. The inferior capsule was released with care after identifying and protecting the axillary nerve. The proximal humeral cut was made at approximately 30 of retroversion using the extra-medullary guide.   Attention was redirected to the glenoid. The labrum was debrided circumferentially before the center of the glenoid was marked with electrocautery. The guidewire was drilled into the glenoid neck using the appropriate guide. After verifying its position, it was overreamed with the mini-baseplate reamer to create a flat surface. The permanent mini-baseplate was impacted into place. It was stabilized with a 30 x 6.5 mm central screw and four peripheral screws. Locking screws were placed superiorly and inferiorly while nonlocking screws were placed anteriorly and posteriorly. The permanent 36 mm +3 mm glenosphere was then impacted into place and its Morse taper locking mechanism verified using manual distraction.  Attention was directed to the humeral side. The humeral canal was reamed sequentially beginning with the end-cutting reamer then progressing from a 4 mm reamer up to a 12 mm reamer. This provided excellent circumferential chatter. The canal was broached beginning with a #5 broach and progressing to a #12 broach. This was left in place and a trial reduction performed using the standard trial humeral platform. The arm demonstrated excellent  range of motion as the hand could be brought across the chest to the opposite shoulder and brought to the top of the patient's head and  to the patient's ear. The shoulder appeared stable throughout this range of motion. The joint was dislocated and the trial components removed. The permanent #12 mini-stem was impacted into place with care taken to maintain the appropriate version. The permanent 44 mm humeral platform with the standard insert was put together on the back table and impacted into place. Again, the [facility] taper locking mechanism was verified using manual distraction. The shoulder was relocated using two finger pressure and again placed through a range of motion with the findings as described above.  The wound was copiously irrigated with bacitracin saline solution using the jet lavage system before a total of 20 cc of Exparel diluted out to 60 cc with normal saline and 30 cc of 0.5% Sensorcaine with epinephrine was injected into the pericapsular and peri-incisional tissues to help with postoperative analgesia. The subscapularis tendon was reapproximated using #2 FiberWire interrupted sutures. The deltopectoral interval was closed using 2-0 Vicryl interrupted sutures before the subcutaneous tissues also were closed using 2-0 Vicryl interrupted sutures. The skin was closed using staples. Prior to closing the skin, 1 g of transexemic acid in 10 cc of normal saline was injected intra-articularly to help with postoperative bleeding. A sterile occlusive dressing was applied to the wound before the arm was placed into a shoulder immobilizer with an abduction pillow. A Polar Care system also was applied to the shoulder. The patient was then transferred back to a hospital bed before being awakened, extubated, and returned to the recovery room in satisfactory condition after tolerating the procedure well.

## 2016-01-17 NOTE — Progress Notes (Signed)
Pt arrived from pacu at approx 1810 via bed, family members present. Pt is alert and oriented, resting in bed with his eyes closed, stated pain at a low rate ot his R shoulder. Pt has immobilizer and polar care intact to R arm, fingers to r hand are cold, R radial pulse is wnl. Pt is able to move his fingers. Lungs are clear, respirations are shallow, o2 on at 2l [place]. Pt has pacemeaker intact to L chest, HR is irregular, abdomen is soft, bs hypoactive.. Abdomen is soft, bs hypoactive. PPP, no edema noted. Foot pumps on bilat. PIV #20 intact to L arm, site is free of redness and swelling. Srx2, call bell in reach.

## 2016-01-17 NOTE — H&P (Signed)
Paper H&P to be scanned into permanent record. H&P reviewed. No changes. 

## 2016-01-17 NOTE — Transfer of Care (Signed)
Immediate Anesthesia Transfer of Care Note  Patient: [name]  Procedure(s) Performed: Procedure(s): REVERSE SHOULDER ARTHROPLASTY (Right)  Patient Location: PACU  Anesthesia Type:General  Level of Consciousness: awake  Airway & Oxygen Therapy: Patient Spontanous Breathing and Patient connected to face mask oxygen  Post-op Assessment: Report given to RN and Post -op Vital signs reviewed and stable  Post vital signs: Reviewed  Last Vitals:  Vitals:   01/17/16 1312 01/17/16 1657  BP: (!) 142/62 138/87  Pulse: 67 64  Resp: 18 20  Temp: 36.2 C (P) 36.1 C    Last Pain:  Vitals:   01/17/16 1312  TempSrc: Tympanic  PainSc: 4          Complications: No apparent anesthesia complications

## 2016-01-17 NOTE — Progress Notes (Signed)
ANTICOAGULATION CONSULT NOTE - Initial Consult  Pharmacy Consult for Warfarin  Indication: atrial fibrillation    Patient Measurements: Weight: 135 lb (61.2 kg) Heparin Dosing Weight:   Vital Signs: Temp: 97.2 F (36.2 C) (10/10 1852) Temp Source: Axillary (10/10 1852) BP: 128/61 (10/10 1852) Pulse Rate: 59 (10/10 1852)  Labs:  Recent Labs  01/17/16 1329  LABPROT 14.0  INR 1.08    Estimated Creatinine Clearance: 58 mL/min (by C-G formula based on SCr of 0.85 mg/dL).   Medical History: Past Medical History:  Diagnosis Date  . AICD (automatic cardioverter/defibrillator) present   . Allergic rhinitis    [name]  . Anemia   . Anxiety   . Arthritis   . Atrial flutter ([place])    A.  Status post cardioversion; B.  Tikosyn therapy - failed, remains in aflutter  . Bilateral pneumonia 11/02/2013   treated with levaquin  . BPH (benign prostatic hyperplasia)   . 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 Lucky Cowboy)  . Chronic sinusitis   . Chronic systolic congestive heart failure ([place])   . COPD (chronic obstructive pulmonary disease) ([place])    spirometry 2015 - no obstruction, + mild restrictive lung disease  . Extrinsic asthma    [name]  . GERD (gastroesophageal reflux disease) 2010   h/o esophageal stricture with dilation, LA grade C reflux esophagitis by EGD 2010  . Goiter   . History of diverticulitis of colon   . History of GI bleed    Secondary to hemorrhoids  . IBS (irritable bowel syndrome)   . LBBB (left bundle branch block)   . Multiple pulmonary nodules 06/2013   RUL ([facility] Adam Phenix [facility]) - ?vasculitis as of last CT at [facility]  . NICM (nonischemic cardiomyopathy) [facility])    Cardiac catheterization March 2006 without coronary disease; echocardiogram August 2008: EF 35%, mild AI, left atrial enlargement, EF 2% 2016  . 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 Presence of permanent cardiac pacemaker     Medications:  Prescriptions Prior to Admission   Medication Sig Dispense Refill Last Dose  . acetaminophen (TYLENOL) 500 MG tablet Take 250 mg by mouth daily as needed. Pain   Past Month at Unknown time  . ALPRAZolam (XANAX) 0.25 MG tablet Take 1 tablet (0.25 mg total) by mouth at bedtime. 60 tablet 0 01/17/2016 at Unknown time  . carvedilol (COREG) 6.25 MG tablet TAKE 1 TABLET TWICE DAILY WITH MEALS 180 tablet 1 01/17/2016 at 0700  . enalapril (VASOTEC) 5 MG tablet TAKE 1 TABLET TWICE DAILY 180 tablet 4 01/17/2016 at Unknown time  . fluticasone (FLONASE) 50 MCG/ACT nasal spray Place 1 spray into both nostrils daily.   01/16/2016 at Unknown time  . furosemide (LASIX) 20 MG tablet TAKE 1/2 TABLET EVERY DAY 45 tablet 3 01/16/2016 at Unknown time  . montelukast (SINGULAIR) 10 MG tablet Take 10 mg by mouth daily as needed (BREATHING).    01/16/2016 at Unknown time  . vitamin B-12 (CYANOCOBALAMIN) 1000 MCG tablet Take 1,000 mcg by mouth daily.   01/16/2016 at Unknown time  . warfarin (COUMADIN) 5 MG tablet TAKE AS DIRECTED  BY  ANTICOAGULATION  CLINIC 135 tablet 3 01/12/2016  . alum & mag hydroxide-simeth (MAALOX/MYLANTA) 200-200-20 MG/5ML suspension Take 15 mLs by mouth daily as needed for indigestion or heartburn.   Not Taking at Unknown time  . cyanocobalamin (,VITAMIN B-12,) 1000 MCG/ML injection Inject 1 mL (1,000 mcg total) into the muscle every  30 (thirty) days. Started 03/2014   Not Taking at Unknown time  . flunisolide (NASAREL) 29 MCG/ACT (0.025%) nasal spray 2 sprays by Nasal route daily. Dose is for each nostril.    Not Taking at Unknown time  . guaiFENesin 200 MG tablet Take 400 mg by mouth every 4 (four) hours as needed for cough or to loosen phlegm.   Not Taking at Unknown time  . levalbuterol (XOPENEX HFA) 45 MCG/ACT inhaler Inhale 1-2 puffs into the lungs every 6 (six) hours as needed for wheezing or shortness of breath. (Patient not taking: Reported on 01/17/2016) 1 Inhaler 1 Not Taking at Unknown time  . Loratadine 10 MG CAPS Take 1  capsule by mouth daily as needed (allergies).    Not Taking at Unknown time  . polyethylene glycol (MIRALAX / GLYCOLAX) packet Take 17 g by mouth daily as needed.   Not Taking at Unknown time  . sucralfate (CARAFATE) 1 G tablet TAKE 1 TABLET BY MOUTH AS DAILY NEEDED for stomach issues   Not Taking at Unknown time    Assessment: 10/10 :  INR = 1.08 Pt S/P arthroplasty right shoulder,  Pt has been off warfarin for ~ 5 days.   Goal of Therapy:  INR 2-3   Plan:  Warfarin 7.5 mg PO X 1 ordered for 10/10 as loading dose. Warfarin 5 mg PO daily ordered to start 10/11 @ 18:00. Will check INR , CBC on 10/11 with AM labs.   Karine Garn D 01/17/2016,7:16 PM

## 2016-01-17 NOTE — Anesthesia Procedure Notes (Signed)
Procedure Name: Intubation Performed by: Rolla Plate Pre-anesthesia Checklist: Patient identified, Patient being monitored, Timeout performed, Emergency Drugs available and Suction available Patient Re-evaluated:Patient Re-evaluated prior to inductionOxygen Delivery Method: Circle system utilized Preoxygenation: Pre-oxygenation with 100% oxygen Intubation Type: IV induction Ventilation: Mask ventilation without difficulty Laryngoscope Size: Miller and 2 Grade View: Grade I Tube type: Oral Tube size: 7.5 mm Number of attempts: 1 Placement Confirmation: ETT inserted through vocal cords under direct vision,  positive ETCO2 and breath sounds checked- equal and bilateral Secured at: 21 cm Tube secured with: Tape Dental Injury: Teeth and Oropharynx as per pre-operative assessment

## 2016-01-17 NOTE — Anesthesia Postprocedure Evaluation (Signed)
Anesthesia Post Note  Patient: [name]  Procedure(s) Performed: Procedure(s) (LRB): REVERSE SHOULDER ARTHROPLASTY (Right)  Patient location during evaluation: PACU Anesthesia Type: General Level of consciousness: awake and alert Pain management: pain level controlled Vital Signs Assessment: post-procedure vital signs reviewed and stable Respiratory status: spontaneous breathing, nonlabored ventilation, respiratory function stable and patient connected to nasal cannula oxygen Cardiovascular status: blood pressure returned to baseline and stable Postop Assessment: no signs of nausea or vomiting Anesthetic complications: no    Last Vitals:  Vitals:   01/17/16 1712 01/17/16 1727  BP: (!) 148/83 (!) 143/76  Pulse: (!) 59 (!) 58  Resp: (!) 28 (!) 31  Temp:  36.3 C    Last Pain:  Vitals:   01/17/16 1727  TempSrc:   PainSc: 8                  Precious Haws Piscitello

## 2016-01-17 NOTE — Anesthesia Preprocedure Evaluation (Signed)
Anesthesia Evaluation  Patient identified by MRN, date of birth, ID band Patient awake    Reviewed: Allergy & Precautions, NPO status , Patient's Chart, lab work & pertinent test results  Airway Mallampati: II  TM Distance: >3 FB     Dental  (+) Chipped, Caps   Pulmonary asthma , pneumonia, resolved, COPD,  COPD inhaler,    Pulmonary exam normal        Cardiovascular + Peripheral Vascular Disease and +CHF  Normal cardiovascular exam+ dysrhythmias Atrial Fibrillation + pacemaker + Cardiac Defibrillator      Neuro/Psych Anxiety  Neuromuscular disease    GI/Hepatic Neg liver ROS, GERD  Medicated and Controlled,  Endo/Other  negative endocrine ROS  Renal/GU negative Renal ROS     Musculoskeletal  (+) Arthritis , Osteoarthritis,    Abdominal Normal abdominal exam  (+)   Peds negative pediatric ROS (+)  Hematology  (+) anemia ,   Anesthesia Other Findings Past Medical History: No date: AICD (automatic cardioverter/defibrillator) pr* No date: Allergic rhinitis     Comment: Donneta Romberg No date: Anemia No date: Anxiety No date: Arthritis No date: Atrial flutter ([facility])     Comment: A.  Status post cardioversion; B.  Tikosyn               therapy - failed, remains in aflutter 11/02/2013: Bilateral pneumonia     Comment: treated with levaquin No date: BPH (benign prostatic hyperplasia) 10/2011: 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[facility])     Comment: 1.2 cm, rec f/u 6 mo (Dr. Lucky Cowboy) No date: Chronic sinusitis No date: Chronic systolic congestive heart failure ([place]) No date: COPD (chronic obstructive pulmonary disease) (*     Comment: spirometry 2015 - no obstruction, + mild               restrictive lung disease No date: Extrinsic asthma     Comment: [name] 2010: GERD (gastroesophageal reflux disease)     Comment: h/o esophageal stricture with dilation, LA               grade C reflux esophagitis by EGD 2010 No date: Goiter No date:  History of diverticulitis of colon No date: History of GI bleed     Comment: Secondary to hemorrhoids No date: IBS (irritable bowel syndrome) No date: LBBB (left bundle branch block) 06/2013: Multiple pulmonary nodules     Comment: RUL ([facility] 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[place]) No date: Presence of permanent cardiac pacemaker  Reproductive/Obstetrics                             Anesthesia Physical Anesthesia Plan  ASA: IV  Anesthesia Plan: General   Post-op Pain Management:    Induction: Intravenous  Airway Management Planned: Oral ETT  Additional Equipment:   Intra-op Plan:   Post-operative Plan:   Informed Consent: I have reviewed the patients History and Physical,  chart, labs and discussed the procedure including the risks, benefits and alternatives for the proposed anesthesia with the patient or authorized representative who has indicated his/her understanding and acceptance.   Dental advisory given  Plan Discussed with: CRNA and Surgeon  Anesthesia Plan Comments:         Anesthesia Quick Evaluation

## 2016-01-18 ENCOUNTER — Encounter: Payer: Self-pay | Admitting: Family Medicine

## 2016-01-18 LAB — CBC WITH DIFFERENTIAL/PLATELET
Basophils Absolute: 0 10*3/uL (ref 0–0.1)
Basophils Relative: 0 %
Eosinophils Absolute: 0 10*3/uL (ref 0–0.7)
Eosinophils Relative: 0 %
HCT: 35 % — ABNORMAL LOW (ref 40.0–52.0)
Hemoglobin: 12.2 g/dL — ABNORMAL LOW (ref 13.0–18.0)
Lymphocytes Relative: 8 %
Lymphs Abs: 0.7 10*3/uL — ABNORMAL LOW (ref 1.0–3.6)
MCH: 30.4 pg (ref 26.0–34.0)
MCHC: 34.9 g/dL (ref 32.0–36.0)
MCV: 87.1 fL (ref 80.0–100.0)
Monocytes Absolute: 0.1 10*3/uL — ABNORMAL LOW (ref 0.2–1.0)
Monocytes Relative: 2 %
Neutro Abs: 7.4 10*3/uL — ABNORMAL HIGH (ref 1.4–6.5)
Neutrophils Relative %: 90 %
Platelets: 94 10*3/uL — ABNORMAL LOW (ref 150–440)
RBC: 4.01 MIL/uL — ABNORMAL LOW (ref 4.40–5.90)
RDW: 15.4 % — ABNORMAL HIGH (ref 11.5–14.5)
WBC: 8.2 10*3/uL (ref 3.8–10.6)

## 2016-01-18 LAB — BASIC METABOLIC PANEL
Anion gap: 6 (ref 5–15)
BUN: 26 mg/dL — ABNORMAL HIGH (ref 6–20)
CO2: 24 mmol/L (ref 22–32)
Calcium: 8.5 mg/dL — ABNORMAL LOW (ref 8.9–10.3)
Chloride: 106 mmol/L (ref 101–111)
Creatinine, Ser: 0.98 mg/dL (ref 0.61–1.24)
GFR calc Af Amer: >60 mL/min (ref >60)
GFR calc non Af Amer: >60 mL/min (ref >60)
Glucose, Bld: 186 mg/dL — ABNORMAL HIGH (ref 65–99)
Potassium: 4.6 mmol/L (ref 3.5–5.1)
Sodium: 136 mmol/L (ref 135–145)

## 2016-01-18 LAB — PROTIME-INR
INR: 1.16
Prothrombin Time: 14.9 seconds (ref 11.4–15.2)

## 2016-01-18 MED ORDER — BUPIVACAINE HCL (PF) 0.5 % IJ SOLN
10.0000 mL | Freq: Once | INTRAMUSCULAR | Status: AC
  Administered 2016-01-18: 10 mL
  Filled 2016-01-18: qty 10

## 2016-01-18 MED ORDER — OXYCODONE HCL 5 MG PO TABS: 5.0000 mg | ORAL_TABLET | ORAL | 0 refills | Status: DC | PRN

## 2016-01-18 MED ORDER — METHYLPREDNISOLONE ACETATE 40 MG/ML IJ SUSP
40.0000 mg | Freq: Once | INTRAMUSCULAR | Status: AC
  Administered 2016-01-18: 40 mg
  Filled 2016-01-18: qty 1

## 2016-01-18 NOTE — Progress Notes (Signed)
ANTICOAGULATION CONSULT NOTE - Initial Consult  Pharmacy Consult for Warfarin  Indication: atrial fibrillation    Patient Measurements: Height: 5\' 6"  (167.6 cm) Weight: 135 lb (61.2 kg) IBW/kg (Calculated) : 63.8 Heparin Dosing Weight:   Vital Signs: Temp: 98 F (36.7 C) (10/11 0549) Temp Source: Oral (10/11 0820) BP: 102/54 (10/11 0820) Pulse Rate: 59 (10/11 0820)  Labs:  Recent Labs  01/17/16 1329 01/18/16 0355  HGB  --  12.2*  HCT  --  35.0*  PLT  --  94*  LABPROT 14.0 14.9  INR 1.08 1.16  CREATININE  --  0.98    Estimated Creatinine Clearance: 50.3 mL/min (by C-G formula based on SCr of 0.98 mg/dL).   Assessment: Pt S/P arthroplasty right shoulder,  Pt has been off warfarin for ~ 5 days without enoxaparin bridge (per anticoag note, low risk with CHADS score of 2).  Most recent warfarin dose 5mg  daily Currently on enoxaparin 40mg  SQ Q24H for VTE prophylaxis.  Date  INR  Warfarin Dose 10/10  1.08  7.5mg  10/11  1.16    Goal of Therapy:  INR 2-3   Plan:  Received warfarin 7.5mg  yesterday evening as initial dose. Will resume previous regimen of warfarin 5mg  QPM. Continue enoxaparin.  Note platelet count of 94, was 119 on 9/29 PTA. Hgb 12.2, which appears to close be pts baseline. Will continue to monitor.  Recheck INR with AM labs  Terie Lear C 01/18/2016,9:32 AM

## 2016-01-18 NOTE — Progress Notes (Signed)
Lonn Georgia Turton to be D/C'd Home per MD order.  Discussed with the patient and all questions fully answered.  VSS, Skin clean, dry and intact without evidence of skin break down, no evidence of skin tears noted. IV catheter discontinued intact. Site without signs and symptoms of complications. Dressing and pressure applied.  An After Visit Summary was printed and given to the patient. Patient received prescription.  D/c education completed with patient/family including follow up instructions, medication list, d/c activities limitations if indicated, with other d/c instructions as indicated by MD - patient able to verbalize understanding, all questions fully answered.   Patient instructed to return to ED, call 911, or call MD for any changes in condition.   Patient escorted via [place], and D/C home via private auto.  Deri Fuelling 01/18/2016 11:21 AM

## 2016-01-18 NOTE — Discharge Instructions (Signed)
Shoulder Joint Replacement, Care After Refer to this sheet in the next few weeks. These instructions provide you with information on caring for yourself after your procedure. Your health care provider may also give you more specific instructions. Your treatment has been planned according to current medical practices, but problems sometimes occur. Call your health care provider if you have any problems or questions after your procedure. WHAT TO EXPECT AFTER THE PROCEDURE After your procedure, your arm and shoulder will typically be stiff and bruised. This will improve over time. HOME CARE INSTRUCTIONS   You may resume your normal diet and activities as directed by your surgeon.  You should regain full use of your shoulder in 6 weeks.  Your arm will be in a sling. You will need to wear this for 4-6 weeks after surgery.  Wear the sling every night for at least the first month, or as instructed by your surgeon.  Do not use your arm to push yourself up in bed or from a chair. This requires too much muscle.  Follow the program of home exercises suggested. Do the exercises 4-5 times a day for a month or as directed.  Try not to overuse your shoulder. Overusing the shoulder is easy to do if this is the first time you have been pain free in a long time. Early overuse of the shoulder may result in later problems.  Do not lift anything heavier than a cup of coffee for the first 6 weeks after surgery.  Ask for help. Your health care provider may be able to suggest a clinic or agency for this if you do not have home support.  Do not participate in contact sports or do any heavy lifting (more than 10 lb [4.5 kg]) for at least 6 months, or as directed.  Apply ice to the injured area for the first 2 days after surgery:  Put ice in a plastic bag.  Place a towel between your skin and the bag.  Leave the ice on for 20 minutes, 2-3 times a day.  Change dressings if necessary or as directed.  Only  take over-the-counter or prescription medicines for pain, discomfort, or fever as directed by your health care provider.  Keep all follow-up appointments as directed. SEEK MEDICAL CARE IF:  You have redness, swelling, or increasing pain in the wound.  You see pus coming from the wound.  You have a fever.  You notice a bad smell coming from the wound or dressing.  The edges of the wound break open after sutures or staples have been removed.  You have increasing pain with movement of the shoulder. SEEK IMMEDIATE MEDICAL CARE IF:   You develop a rash.  You have chest pain or shortness of breath.  You have any reaction or side effects to medicine given. MAKE SURE YOU:  Understand these instructions.  Will watch your condition.  Will get help right away if you are not doing well or get worse.   This information is not intended to replace advice given to you by your health care provider. Make sure you discuss any questions you have with your health care provider.   Document Released: 10/13/2004 Document Revised: 03/31/2013 Document Reviewed: 10/23/2012 Elsevier Interactive Patient Education [employer].

## 2016-01-18 NOTE — Progress Notes (Signed)
Patient BP 102/54, HR 59 PA made aware, ordered to hold morning BP medications.  Deri Fuelling, RN

## 2016-01-18 NOTE — Evaluation (Signed)
Physical Therapy Evaluation Patient Details Name: [name] MRN: [id number] DOB: 12-12-1931 Today's Date: 01/18/2016   History of Present Illness  Pt. is an 82 y.o. male who was admitted to [facility] for a Reverse Total Shoulder Arthroplasty.  Clinical Impression  Pt is a pleasant 82 year old male who was admitted for TSR on R shoulder. Pt performs bed mobility with mod I, transfers with supervision, and ambulation with cga and no AD. Pt demonstrates deficits with strength/pain/mobility. Pt educated on donning/doffing sling and positioning in bed. Written there-ex given and educated prior to dc home. Pt has met goals for dc home this date. Would benefit from skilled PT to address above deficits and promote optimal return to PLOF. Recommend transition to [place] upon discharge from acute hospitalization.       Follow Up Recommendations Home health PT    Equipment Recommendations  None recommended by PT    Recommendations for Other Services       Precautions / Restrictions Precautions Precautions: Fall;Shoulder Precaution Booklet Issued: Yes (comment) Required Braces or Orthoses: Sling;Other Brace/Splint Restrictions Weight Bearing Restrictions: Yes RUE Weight Bearing: Non weight bearing      Mobility  Bed Mobility Overal bed mobility: Modified Independent             General bed mobility comments: used railing for assistance. Once seated at EOB,  pt able to sit with independence  Transfers Overall transfer level: Needs assistance Equipment used: None Transfers: Sit to/from Stand Sit to Stand: Supervision         General transfer comment: safe technique performed with upright posture. Sling adjusted while sitting at EOB.  Ambulation/Gait Ambulation/Gait assistance: Min guard Ambulation Distance (Feet): 200 Feet Assistive device: None Gait Pattern/deviations: Step-through pattern     General Gait Details: ambulated in hallway with no AD. Supervision  given with no LOB noted and good gait speed. All mobility performed on room air with sats WNL  Stairs            Wheelchair Mobility    Modified Rankin (Stroke Patients Only)       Balance Overall balance assessment: History of Falls;Modified Independent                                           Pertinent Vitals/Pain Pain Assessment: 0-10 Pain Score: 4  Pain Descriptors / Indicators: Aching;Operative site guarding Pain Intervention(s): Limited activity within patient's tolerance    Home Living Family/patient expects to be discharged to:: Private residence Living Arrangements: Spouse/significant other Available Help at Discharge: Family Type of Home: House Home Access: Level entry     Home Layout: One level Home Equipment: Environmental consultant - 2 wheels      Prior Function Level of Independence: Independent         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     Hand Dominance   Dominant Hand: Right    Extremity/Trunk Assessment   Upper Extremity Assessment: RUE deficits/detail RUE Deficits / Details: in sling, grossly 3/5 RUE: Unable to fully assess due to immobilization       Lower Extremity Assessment: Overall WFL for tasks assessed         Communication   Communication: No difficulties  Cognition Arousal/Alertness: Awake/alert Behavior During Therapy: WFL for tasks assessed/performed Overall Cognitive Status: Within Functional Limits for tasks assessed  General Comments      Exercises Other Exercises Other Exercises: Seated ther-ex performed including B hand squeezes, wrist flexion/extension, elbow flexion/extension along with neck lateral flexion and scap squeezes. 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 PT Goal Formulation: With patient Potential to Achieve Goals: Good    Frequency BID   Barriers to discharge        Co-evaluation               End of Session Equipment Utilized During Treatment: Gait belt Activity Tolerance: Patient tolerated treatment well Patient left: in bed;with bed alarm set;with family/visitor present Nurse Communication: Mobility status         Time: 0943-1008 PT Time Calculation (min) (ACUTE ONLY): 25 min   Charges:   PT Evaluation $PT Eval Moderate Complexity: 1 Procedure PT Treatments $Therapeutic Exercise: 8-22 mins   PT G Codes:        Ray,Stephanie 01/18/2016, 2:48 PM  Stephanie Ray, PT, DPT [phone]  

## 2016-01-18 NOTE — Progress Notes (Signed)
Subjective: 1 Day Post-Op Procedure(s) (LRB): REVERSE SHOULDER ARTHROPLASTY (Right) Patient reports pain as mild.   Patient is well, and has had no acute complaints or problems Plan is to go Home after hospital stay. Negative for chest pain and shortness of breath Fever: no Gastrointestinal:Negative for nausea and vomiting  Objective: Vital signs in last 24 hours: Temp:  [97 F (36.1 C)-98.3 F (36.8 C)] 98 F (36.7 C) (10/11 0549) Pulse Rate:  [58-67] 60 (10/11 0549) Resp:  [16-31] 19 (10/11 0549) BP: (103-148)/(51-87) 103/56 (10/11 0549) SpO2:  [94 %-100 %] 100 % (10/11 0549) Weight:  [61.2 kg (135 lb)] 61.2 kg (135 lb) (10/10 1312)  Intake/Output from previous day:  Intake/Output Summary (Last 24 hours) at 01/18/16 0805 Last data filed at 01/18/16 0625  Gross per 24 hour  Intake             1620 ml  Output              600 ml  Net             1020 ml    Intake/Output this shift: No intake/output data recorded.  Labs:  Recent Labs  01/18/16 0355  HGB 12.2*    Recent Labs  01/18/16 0355  WBC 8.2  RBC 4.01*  HCT 35.0*  PLT 94*    Recent Labs  01/18/16 0355  NA 136  K 4.6  CL 106  CO2 24  BUN 26*  CREATININE 0.98  GLUCOSE 186*  CALCIUM 8.5*    Recent Labs  01/17/16 1329 01/18/16 0355  INR 1.08 1.16     EXAM General - Patient is Alert, Appropriate and Oriented Extremity - ABD soft Sensation intact distally Intact pulses distally Dorsiflexion/Plantar flexion intact Incision: dressing C/D/I Dressing/Incision - clean, dry, no drainage Motor Function - intact, moving foot and toes well on exam.  Pt is intact to light touch over the distribution of the right axillary nerve, able to flex and extend wrist without pain.  Past Medical History:  Diagnosis Date  . AICD (automatic cardioverter/defibrillator) present   . Allergic rhinitis    [name]  . Anemia   . Anxiety   . Arthritis   . Atrial flutter ([place])    A.  Status post cardioversion;  B.  Tikosyn therapy - failed, remains in aflutter  . Bilateral pneumonia 11/02/2013   treated with levaquin  . BPH (benign prostatic hyperplasia)   . 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 Lucky Cowboy)  . Chronic sinusitis   . Chronic systolic congestive heart failure ([place])   . COPD (chronic obstructive pulmonary disease) ([place])    spirometry 2015 - no obstruction, + mild restrictive lung disease  . Extrinsic asthma    [name]  . GERD (gastroesophageal reflux disease) 2010   h/o esophageal stricture with dilation, LA grade C reflux esophagitis by EGD 2010  . Goiter   . History of diverticulitis of colon   . History of GI bleed    Secondary to hemorrhoids  . IBS (irritable bowel syndrome)   . LBBB (left bundle branch block)   . Multiple pulmonary nodules 06/2013   RUL ([facility] Adam Phenix [facility]) - ?vasculitis as of last CT at [facility]  . NICM (nonischemic cardiomyopathy) [facility])    Cardiac catheterization March 2006 without coronary disease; echocardiogram August 2008: EF 35%, mild AI, left atrial enlargement, EF 2% 2016  . 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 Presence of  permanent cardiac pacemaker     Assessment/Plan: 1 Day Post-Op Procedure(s) (LRB): REVERSE SHOULDER ARTHROPLASTY (Right) Active Problems:   Status post reverse total arthroplasty of right shoulder  Estimated body mass index is 21.79 kg/m as calculated from the following:   Height as of 01/06/16: 5\' 6"  (1.676 m).   Weight as of this encounter: 61.2 kg (135 lb). Advance diet Up with therapy D/C IV fluids when tolerating PO intake.  Labs reviewed. Pt complaining of left shoulder pain, administered a left subacromial steroid injection using 4cc's of 0.5% Marcaine and 1cc of Depo-Medrol. Up with therapy, will plan on discharge home today. Will need home-health nursing for INR checks until therapeutic INR between 2-3. Pt reports no issues this AM, passing gas.  DVT Prophylaxis - Coumadin, Foot Pumps and TED  hose Non-weightbearing to the right upper extremity.  Raquel James, PA-C [facility] 01/18/2016, 8:05 AM

## 2016-01-18 NOTE — Discharge Summary (Signed)
Physician Discharge Summary  Patient ID: [name] MRN: [id number] DOB/AGE: 1932-02-25 82 y.o.  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 Status post reverse total arthroplasty of right shoulder 01/17/2016  . Complete tear of right rotator cuff 12/10/2015  . Acute sinusitis 03/18/2015  . Complete tear of left rotator cuff 05/07/2014  . Warfarin anticoagulation 05/07/2014  . Advanced care planning/counseling discussion 03/16/2014  . Constipation 09/09/2013  . Hyperglycemia 09/09/2013  . Medicare annual wellness visit, subsequent 04/22/2012  . 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 Left sided sciatica 06/11/2011  . Thrombocytopenia ([place]) 02/25/2011  . B12 deficiency 02/22/2011  . Sinus congestion 09/25/2010  . Long term current use of anticoagulant 06/27/2010  . ESOPHAGEAL STRICTURE 02/16/2010  . CHEST PAIN 01/03/2010  . Atrial flutter ([place]) 12/20/2008  . ANXIETY STATE, UNSPECIFIED 12/08/2008  . Secondary cardiomyopathy ([place]) 09/13/2008  . LBBB 09/13/2008  . Automatic implantable cardioverter-defibrillator in situ 09/13/2008  . PORPHYRIA 12/16/2006  . Chronic systolic heart failure ([place]) 12/16/2006  . ALLERGIC RHINITIS 12/16/2006  . GERD 12/16/2006  . BENIGN PROSTATIC HYPERTROPHY 12/16/2006  . 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 AICD (automatic cardioverter/defibrillator) present   . Allergic rhinitis    [name]  . Anemia   . Anxiety   . Arthritis   . Atrial flutter ([place])    A.  Status post cardioversion; B.  Tikosyn therapy - failed, remains in aflutter  . Bilateral pneumonia 11/02/2013    treated with levaquin  . BPH (benign prostatic hyperplasia)   . 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 Lucky Cowboy)  . Chronic sinusitis   . Chronic systolic congestive heart failure ([place])   . COPD (chronic obstructive pulmonary disease) ([place])    spirometry 2015 - no obstruction, + mild restrictive lung disease  . Extrinsic asthma    [name]  . GERD (gastroesophageal reflux disease) 2010   h/o esophageal stricture with dilation, LA grade C reflux esophagitis by EGD 2010  . Goiter   . History of diverticulitis of colon   . History of GI bleed    Secondary to hemorrhoids  . IBS (irritable bowel syndrome)   . LBBB (left bundle branch block)   . Multiple pulmonary nodules 06/2013   RUL ([facility] Adam Phenix at [facility]) - ?vasculitis as of last CT at [facility]  . NICM (nonischemic cardiomyopathy) [facility])    Cardiac catheterization March 2006 without coronary disease; echocardiogram August 2008: EF 35%, mild AI, left atrial enlargement, EF 2% 2016  . Porphyria ([place])   . Presence of permanent cardiac pacemaker      Transfusion: None   Consultants (if any):   Discharged Condition: Improved  Hospital Course: [name] is an 82 y.o. male who was admitted 01/17/2016 with a diagnosis of massive irreparable rotator cuff tear with severe cuff arthropathy of the right shoulder and went to the operating room on 01/17/2016 and underwent the above named procedures.    Surgeries: Procedure(s): REVERSE SHOULDER ARTHROPLASTY on 01/17/2016 Patient tolerated the surgery well. Taken to PACU where she was stabilized and then transferred to the orthopedic floor.  Started on Lovenox 40mg   q 24 hrs while bridging back to his Warfarin dosage of 5mg  daily. Foot pumps applied bilaterally at 80 mm. Heels elevated on bed with rolled towels. No evidence of DVT. Negative [place]. Physical therapy started on day #1 for gait training and transfer.  Patient's IV and Foley were d/c on  POD1  Implants: All press-fit [employer] Comprehensive system with a #12 mini-humeral stem, a 44 mm humeral tray with a standard insert, and a mini-base plate with a 36 mm glenosphere (+3 mm).  He was given perioperative antibiotics:  Anti-infectives    Start     Dose/Rate Route Frequency Ordered Stop   01/18/16 0130  vancomycin (VANCOCIN) IVPB 1000 mg/200 mL premix     1,000 mg 200 mL/hr over 60 Minutes Intravenous Every 12 hours 01/17/16 1820 01/18/16 0047   01/17/16 0045  vancomycin (VANCOCIN) IVPB 1000 mg/200 mL premix     1,000 mg 200 mL/hr over 60 Minutes Intravenous  Once 01/17/16 0030 01/17/16 1433    .  He was given sequential compression devices, early ambulation, and Lovenox and Warfarin for DVT prophylaxis.  He benefited maximally from the hospital stay and there were no complications.    Recent vital signs:  Vitals:   01/18/16 0549 01/18/16 0820  BP: (!) 103/56 (!) 102/54  Pulse: 60 (!) 59  Resp: 19 16  Temp: 98 F (36.7 C)     Recent laboratory studies:  Lab Results  Component Value Date   HGB 12.2 (L) 01/18/2016   HGB 12.9 (L) 01/06/2016   HGB 12.4 (L) 12/16/2015   Lab Results  Component Value Date   WBC 8.2 01/18/2016   PLT 94 (L) 01/18/2016   Lab Results  Component Value Date   INR 1.16 01/18/2016   Lab Results  Component Value Date   NA 136 01/18/2016   K 4.6 01/18/2016   CL 106 01/18/2016   CO2 24 01/18/2016   BUN 26 (H) 01/18/2016   CREATININE 0.98 01/18/2016   GLUCOSE 186 (H) 01/18/2016    Discharge Medications:     Medication List    TAKE these medications   acetaminophen 500 MG tablet Commonly known as:  TYLENOL Take 250 mg by mouth daily as needed. Pain   ALPRAZolam 0.25 MG tablet Commonly known as:  XANAX Take 1 tablet (0.25 mg total) by mouth at bedtime.   alum & mag hydroxide-simeth 200-200-20 MG/5ML suspension Commonly known as:  MAALOX/MYLANTA Take 15 mLs by mouth daily as needed for indigestion or heartburn.    carvedilol 6.25 MG tablet Commonly known as:  COREG TAKE 1 TABLET TWICE DAILY WITH MEALS   vitamin B-12 1000 MCG tablet Commonly known as:  CYANOCOBALAMIN Take 1,000 mcg by mouth daily.   cyanocobalamin 1000 MCG/ML injection Commonly known as:  (VITAMIN B-12) Inject 1 mL (1,000 mcg total) into the muscle every 30 (thirty) days. Started 03/2014   enalapril 5 MG tablet Commonly known as:  VASOTEC TAKE 1 TABLET TWICE DAILY   flunisolide 29 MCG/ACT (0.025%) nasal spray Commonly known as:  NASAREL 2 sprays by Nasal route daily. Dose is for each nostril.   fluticasone 50 MCG/ACT nasal spray Commonly known as:  FLONASE Place 1 spray into both nostrils daily.   furosemide 20 MG tablet Commonly known as:  LASIX TAKE 1/2 TABLET EVERY DAY   guaiFENesin 200 MG tablet Take 400 mg by mouth every 4 (four) hours as needed for cough or to loosen phlegm.   levalbuterol 45 MCG/ACT inhaler Commonly known as:  XOPENEX HFA Inhale 1-2 puffs into the lungs every 6 (six) hours as needed for wheezing or shortness of breath.   Loratadine 10 MG Caps Take 1 capsule by mouth daily as needed (allergies).   montelukast 10 MG tablet Commonly known as:  SINGULAIR Take 10 mg by mouth daily as needed (BREATHING).   oxyCODONE 5 MG immediate release tablet Commonly known as:  Oxy IR/ROXICODONE Take 1-2 tablets (5-10 mg total) by mouth every 4 (four) hours as needed for breakthrough pain.   polyethylene glycol packet Commonly known as:  MIRALAX / GLYCOLAX Take 17 g by mouth daily as needed.   sucralfate 1 g tablet Commonly known as:  CARAFATE TAKE 1 TABLET BY MOUTH AS DAILY NEEDED for stomach issues   warfarin 5 MG tablet Commonly known as:  COUMADIN TAKE AS DIRECTED  BY  ANTICOAGULATION  CLINIC       Diagnostic Studies: Dg Arthro Shoulder Right  Result Date: 12/28/2015 INDICATION: Right shoulder pain. EXAM: RIGHT SHOULDER ARTHROGRAM UNDER FLUOROSCOPIC GUIDANCE PRIOR TO CT COMPARISON:   None. FLUOROSCOPY TIME:  Fluoroscopy Time:  12 seconds Radiation Exposure Index (if provided by the fluoroscopic device): 0.2 mGy Number of Acquired Spot Images: 0 COMPLICATIONS: None immediate. PROCEDURE: The risks and benefits of the procedure were discussed with the patient, and written informed consent was obtained. The patient stated no history of allergy to contrast media. A formal timeout procedure was performed with the patient according to departmental protocol. The patient was placed supine on the fluoroscopy table and the right glenohumeral joint was identified under fluoroscopy. The skin overlying the right glenohumeral joint was subsequently cleaned with Chloraprep and a sterile drape was placed over the area of interest. 5 ml 1% Lidocaine was used to anesthetize the skin around the needle insertion site. A 22 gauge spinal needle was inserted into the right glenohumeral joint under fluoroscopy. Position was confirmed with injection of less than 4ml of Isovue-300 under fluoroscopy. 12 ml of iodinated contrast mixture (15 mL Isovue-300 with 5 ml of sterile saline) was injected into the right glenohumeral joint. The needle was removed and hemostasis was achieved. The patient was subsequently transferred to CT for imaging. IMPRESSION: Succ successful right shoulder arthrogram prior to CT. Electronically Signed   By: Kathreen Devoid   On: 12/28/2015 10:44   Ct Shoulder Right W Contrast  Result Date: 12/28/2015 CLINICAL DATA:  Right shoulder pain for 2 years. EXAM: CT ARTHROGRAPHY OF THE RIGHT SHOULDER TECHNIQUE: Multidetector CT imaging was performed following the standard protocol after injection of dilute contrast into the joint. COMPARISON:  None. FINDINGS: Rotator cuff: Complete tear of the supraspinatus tendon with 4.6 cm of retraction. Infraspinatus is intact, but thickened at its insertion concerning for tendinosis. Teres minor tendon is intact. Subscapularis tendon is intact. Muscles: Mild fatty  atrophy of the supraspinatus muscle. Severe fatty atrophy of the subscapularis muscle. Biceps Long Head: Intraarticular portion of the long head of biceps tendon is not visualized concerning for a complete tear. Acromioclavicular Joint: Moderate degenerative changes of the acromioclavicular joint. Type II acromion. Glenohumeral Joint: Intraarticular contrast distending the joint capsule. Severe joint space narrowing of the glenohumeral joint with subchondral cystic changes in the glenoid and humeral head with full-thickness cartilage loss most consistent with moderate-severe osteoarthritis. Moderate -severe glenohumeral synovitis. Labrum: No gross labral tear. Other: No fluid collection or hematoma. Visualized right lung is clear. Mild right lung emphysematous changes. IMPRESSION: 1. Complete tear of the supraspinatus tendon with 4.6 cm of retraction. Mild  atrophy of the supraspinatus muscle. 2. Infraspinatus is intact, but thickened at its insertion concerning for tendinosis. 3. Moderate-severe osteoarthritis of the glenohumeral joint. 4. Moderate -severe glenohumeral synovitis. 5. Emphysematous changes of the right lung. Electronically Signed   By: Kathreen Devoid   On: 12/28/2015 11:56   Dg Shoulder Right Port  Result Date: 01/17/2016 CLINICAL DATA:  Status post right shoulder replacement. EXAM: PORTABLE RIGHT SHOULDER COMPARISON:  CT 12/28/2015 FINDINGS: Two views of the right shoulder demonstrate a reverse total shoulder arthroplasty. Difficult to evaluate for shoulder location based on the overlapping hardware but the surgical hardware appears to be appropriately aligned. Surgical skin staples are present. Degenerative changes at the right [facility] joint. No evidence for a periprosthetic fracture. IMPRESSION: Status post right shoulder arthroplasty.  No complicating features. Electronically Signed   By: Markus Daft M.D.   On: 01/17/2016 17:34    Disposition: Plan will be for discharge home with HHPT.  Home  nursing order placed as he will need daily INR checks until therapeutic.  Follow-up with [place] in 10-14 days for staple removal.  Follow-up Information    Judson Roch, PA-C Follow up in 14 day(s).   Specialty:  Physician Assistant Why:  Levert Feinstein Removal Contact information: [place] [address] [phone]          Signed: Judson Roch PA-C 01/18/2016, 10:23 AM

## 2016-01-18 NOTE — Evaluation (Signed)
Occupational Therapy Evaluation Patient Details Name: [name] MRN: [id number] DOB: October 07, 1931 Today's Date: 01/18/2016    History of Present Illness Pt. is an 82 y.o. male who was admitted to [facility] for a Reverse Total Shoulder Arthroplasty.   Clinical Impression   Pt. Is an 82 y.o. Male who was admitted to [facility] for a Reverse Total Shoulder Arthroplasty. Pt. Presents with pain, immobilization of the dominant RUE, and weakness which hinder his ability to complete ADL and IADL tasks. Pt. Could benefit from skilled OT services for ADL and A/E training, and to provide pt./family education about home modification, and DME. Pt. has a supportive family, and plans to return home with wife.     Follow Up Recommendations  No OT follow up    Equipment Recommendations       Recommendations for Other Services       Precautions / Restrictions Precautions Required Braces or Orthoses: Sling;Other Brace/Splint Restrictions Weight Bearing Restrictions: Yes RUE Weight Bearing: Non weight bearing                                                     ADL Overall ADL's : Needs assistance/impaired Eating/Feeding: Set up (Assist cutting meat.)   Grooming: Set up           Upper Body Dressing : Maximal assistance   Lower Body Dressing: Moderate assistance                 General ADL Comments: Pt. education was provided ablout A/E use for LE ADLs.     Vision     Perception     Praxis      Pertinent Vitals/Pain Pain Assessment: 0-10 Pain Score: 4  Pain Descriptors / Indicators: Aching Pain Intervention(s): Monitored during session     Hand Dominance Right   Extremity/Trunk Assessment Upper Extremity Assessment Upper Extremity Assessment: RUE deficits/detail RUE: Unable to fully assess due to immobilization           Communication Communication Communication: No difficulties   Cognition Arousal/Alertness:  Awake/alert Behavior During Therapy: WFL for tasks assessed/performed Overall Cognitive Status: Within Functional Limits for tasks assessed                     General Comments       Exercises       Shoulder Instructions      Home Living Family/patient expects to be discharged to:: Private residence Living Arrangements: Spouse/significant other Available Help at Discharge: Family Type of Home: House Home Access: Level entry     Home Layout: One level     Bathroom Shower/Tub: Tub/shower unit Shower/tub characteristics: Sales executive: Yes              Prior Functioning/Environment Level of Independence: Independent                 OT Problem List: Decreased strength;Decreased activity tolerance;Pain   OT Treatment/Interventions: Therapeutic exercise;Self-care/ADL training;Therapeutic activities;Patient/family education    OT Goals(Current goals can be found in the care plan section) Acute Rehab OT Goals 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 OT Goal Formulation: With patient Potential to Achieve Goals: Good  OT Frequency: Min 1X/week   Barriers to D/C:            Co-evaluation  End of Session    Activity Tolerance: Patient tolerated treatment well Patient left: in bed;with call bell/phone within reach;with bed alarm set   Time: 1020-1045 OT Time Calculation (min): 25 min Charges:  OT General Charges $OT Visit: 1 Procedure OT Evaluation $OT Eval Moderate Complexity: 1 Procedure OT Treatments $Self Care/Home Management : 8-22 mins G-Codes:    Harrel Carina, MS, OTR/L 01/18/2016, 11:18 AM

## 2016-01-18 NOTE — Care Management Note (Signed)
Case Management Note  Patient Details  Name: [name] MRN: [id number] Date of Birth: 1931/10/22  Subjective/Objective:  Met with patient and daughter at bedside. Offered choice of home health agency. Referral to [facility] for PT/OT. Wife will be care giver. No lovenox ordered/needed per PA.   Action/Plan: PT/OT with [facility].   Expected Discharge Date:    01/18/2016             Expected Discharge Plan:  [place]  In-House Referral:     Discharge planning Services  CM Consult  Post Acute Care Choice:  Home Health Choice offered to:  Patient, Adult Children  DME Arranged:    DME Agency:     HH Arranged:  PT, OT [place] Agency:  [place] (now [facility])  Status of Service:  Completed, signed off  If discussed at [place] of Stay Meetings, dates discussed:    Additional Comments:  Jolly Mango, RN 01/18/2016, 11:30 AM

## 2016-01-18 NOTE — Progress Notes (Signed)
Clinical Social Worker (CSW) received SNF consult. PT is recommending home health. RN case manager is aware of above. Please reconsult if future social work needs arise. CSW signing off.   Sadhana Frater, LCSW [phone] 

## 2016-01-19 ENCOUNTER — Encounter: Payer: Self-pay | Admitting: Surgery

## 2016-01-21 DIAGNOSIS — I5020 Unspecified systolic (congestive) heart failure: Secondary | ICD-10-CM | POA: Diagnosis not present

## 2016-01-21 DIAGNOSIS — Z96611 Presence of right artificial shoulder joint: Secondary | ICD-10-CM | POA: Diagnosis not present

## 2016-01-21 DIAGNOSIS — I420 Dilated cardiomyopathy: Secondary | ICD-10-CM | POA: Diagnosis not present

## 2016-01-21 DIAGNOSIS — Z471 Aftercare following joint replacement surgery: Secondary | ICD-10-CM | POA: Diagnosis not present

## 2016-01-21 DIAGNOSIS — D649 Anemia, unspecified: Secondary | ICD-10-CM | POA: Diagnosis not present

## 2016-01-21 DIAGNOSIS — K589 Irritable bowel syndrome without diarrhea: Secondary | ICD-10-CM | POA: Diagnosis not present

## 2016-01-23 DIAGNOSIS — Z471 Aftercare following joint replacement surgery: Secondary | ICD-10-CM | POA: Diagnosis not present

## 2016-01-23 DIAGNOSIS — K589 Irritable bowel syndrome without diarrhea: Secondary | ICD-10-CM | POA: Diagnosis not present

## 2016-01-23 DIAGNOSIS — D649 Anemia, unspecified: Secondary | ICD-10-CM | POA: Diagnosis not present

## 2016-01-23 DIAGNOSIS — Z96611 Presence of right artificial shoulder joint: Secondary | ICD-10-CM | POA: Diagnosis not present

## 2016-01-23 DIAGNOSIS — I420 Dilated cardiomyopathy: Secondary | ICD-10-CM | POA: Diagnosis not present

## 2016-01-23 DIAGNOSIS — I5020 Unspecified systolic (congestive) heart failure: Secondary | ICD-10-CM | POA: Diagnosis not present

## 2016-01-25 ENCOUNTER — Ambulatory Visit (INDEPENDENT_AMBULATORY_CARE_PROVIDER_SITE_OTHER): Payer: Medicare Other | Admitting: *Deleted

## 2016-01-25 DIAGNOSIS — I420 Dilated cardiomyopathy: Secondary | ICD-10-CM | POA: Diagnosis not present

## 2016-01-25 DIAGNOSIS — I4892 Unspecified atrial flutter: Secondary | ICD-10-CM | POA: Diagnosis not present

## 2016-01-25 DIAGNOSIS — Z7901 Long term (current) use of anticoagulants: Secondary | ICD-10-CM

## 2016-01-25 DIAGNOSIS — D649 Anemia, unspecified: Secondary | ICD-10-CM | POA: Diagnosis not present

## 2016-01-25 DIAGNOSIS — I5020 Unspecified systolic (congestive) heart failure: Secondary | ICD-10-CM | POA: Diagnosis not present

## 2016-01-25 DIAGNOSIS — Z471 Aftercare following joint replacement surgery: Secondary | ICD-10-CM | POA: Diagnosis not present

## 2016-01-25 DIAGNOSIS — Z96611 Presence of right artificial shoulder joint: Secondary | ICD-10-CM | POA: Diagnosis not present

## 2016-01-25 DIAGNOSIS — K589 Irritable bowel syndrome without diarrhea: Secondary | ICD-10-CM | POA: Diagnosis not present

## 2016-01-25 LAB — POCT INR: INR: 2.1

## 2016-01-27 DIAGNOSIS — K589 Irritable bowel syndrome without diarrhea: Secondary | ICD-10-CM | POA: Diagnosis not present

## 2016-01-27 DIAGNOSIS — I420 Dilated cardiomyopathy: Secondary | ICD-10-CM | POA: Diagnosis not present

## 2016-01-27 DIAGNOSIS — I5020 Unspecified systolic (congestive) heart failure: Secondary | ICD-10-CM | POA: Diagnosis not present

## 2016-01-27 DIAGNOSIS — Z471 Aftercare following joint replacement surgery: Secondary | ICD-10-CM | POA: Diagnosis not present

## 2016-01-27 DIAGNOSIS — D649 Anemia, unspecified: Secondary | ICD-10-CM | POA: Diagnosis not present

## 2016-01-27 DIAGNOSIS — Z96611 Presence of right artificial shoulder joint: Secondary | ICD-10-CM | POA: Diagnosis not present

## 2016-01-30 DIAGNOSIS — Z471 Aftercare following joint replacement surgery: Secondary | ICD-10-CM | POA: Diagnosis not present

## 2016-01-30 DIAGNOSIS — I420 Dilated cardiomyopathy: Secondary | ICD-10-CM | POA: Diagnosis not present

## 2016-01-30 DIAGNOSIS — K589 Irritable bowel syndrome without diarrhea: Secondary | ICD-10-CM | POA: Diagnosis not present

## 2016-01-30 DIAGNOSIS — I5020 Unspecified systolic (congestive) heart failure: Secondary | ICD-10-CM | POA: Diagnosis not present

## 2016-01-30 DIAGNOSIS — D649 Anemia, unspecified: Secondary | ICD-10-CM | POA: Diagnosis not present

## 2016-01-30 DIAGNOSIS — Z96611 Presence of right artificial shoulder joint: Secondary | ICD-10-CM | POA: Diagnosis not present

## 2016-01-31 DIAGNOSIS — Z471 Aftercare following joint replacement surgery: Secondary | ICD-10-CM | POA: Diagnosis not present

## 2016-01-31 DIAGNOSIS — I420 Dilated cardiomyopathy: Secondary | ICD-10-CM | POA: Diagnosis not present

## 2016-01-31 DIAGNOSIS — D649 Anemia, unspecified: Secondary | ICD-10-CM | POA: Diagnosis not present

## 2016-01-31 DIAGNOSIS — K589 Irritable bowel syndrome without diarrhea: Secondary | ICD-10-CM | POA: Diagnosis not present

## 2016-01-31 DIAGNOSIS — I5020 Unspecified systolic (congestive) heart failure: Secondary | ICD-10-CM | POA: Diagnosis not present

## 2016-01-31 DIAGNOSIS — Z96611 Presence of right artificial shoulder joint: Secondary | ICD-10-CM | POA: Diagnosis not present

## 2016-02-01 DIAGNOSIS — Z96611 Presence of right artificial shoulder joint: Secondary | ICD-10-CM | POA: Diagnosis not present

## 2016-02-01 DIAGNOSIS — M25611 Stiffness of right shoulder, not elsewhere classified: Secondary | ICD-10-CM | POA: Diagnosis not present

## 2016-02-01 DIAGNOSIS — F411 Generalized anxiety disorder: Secondary | ICD-10-CM | POA: Diagnosis not present

## 2016-02-01 DIAGNOSIS — M6281 Muscle weakness (generalized): Secondary | ICD-10-CM | POA: Diagnosis not present

## 2016-02-01 DIAGNOSIS — M25511 Pain in right shoulder: Secondary | ICD-10-CM | POA: Diagnosis not present

## 2016-02-06 DIAGNOSIS — F411 Generalized anxiety disorder: Secondary | ICD-10-CM | POA: Diagnosis not present

## 2016-02-06 DIAGNOSIS — M25511 Pain in right shoulder: Secondary | ICD-10-CM | POA: Diagnosis not present

## 2016-02-06 DIAGNOSIS — Z96611 Presence of right artificial shoulder joint: Secondary | ICD-10-CM | POA: Diagnosis not present

## 2016-02-06 DIAGNOSIS — M25611 Stiffness of right shoulder, not elsewhere classified: Secondary | ICD-10-CM | POA: Diagnosis not present

## 2016-02-08 ENCOUNTER — Ambulatory Visit (INDEPENDENT_AMBULATORY_CARE_PROVIDER_SITE_OTHER): Payer: Medicare Other

## 2016-02-08 DIAGNOSIS — Z7901 Long term (current) use of anticoagulants: Secondary | ICD-10-CM

## 2016-02-08 DIAGNOSIS — I4892 Unspecified atrial flutter: Secondary | ICD-10-CM | POA: Diagnosis not present

## 2016-02-08 LAB — POCT INR: INR: 2.6

## 2016-02-09 DIAGNOSIS — J300 Vasomotor rhinitis: Secondary | ICD-10-CM | POA: Diagnosis not present

## 2016-02-09 DIAGNOSIS — K219 Gastro-esophageal reflux disease without esophagitis: Secondary | ICD-10-CM | POA: Diagnosis not present

## 2016-02-09 DIAGNOSIS — H1045 Other chronic allergic conjunctivitis: Secondary | ICD-10-CM | POA: Diagnosis not present

## 2016-02-09 DIAGNOSIS — J4530 Mild persistent asthma, uncomplicated: Secondary | ICD-10-CM | POA: Diagnosis not present

## 2016-02-13 DIAGNOSIS — Z96611 Presence of right artificial shoulder joint: Secondary | ICD-10-CM | POA: Diagnosis not present

## 2016-02-14 DIAGNOSIS — L570 Actinic keratosis: Secondary | ICD-10-CM | POA: Diagnosis not present

## 2016-02-14 DIAGNOSIS — L821 Other seborrheic keratosis: Secondary | ICD-10-CM | POA: Diagnosis not present

## 2016-02-14 DIAGNOSIS — X32XXXA Exposure to sunlight, initial encounter: Secondary | ICD-10-CM | POA: Diagnosis not present

## 2016-02-20 DIAGNOSIS — Z96611 Presence of right artificial shoulder joint: Secondary | ICD-10-CM | POA: Diagnosis not present

## 2016-02-21 DIAGNOSIS — M79672 Pain in left foot: Secondary | ICD-10-CM | POA: Diagnosis not present

## 2016-02-27 DIAGNOSIS — M25611 Stiffness of right shoulder, not elsewhere classified: Secondary | ICD-10-CM | POA: Diagnosis not present

## 2016-02-27 DIAGNOSIS — M25511 Pain in right shoulder: Secondary | ICD-10-CM | POA: Diagnosis not present

## 2016-02-27 DIAGNOSIS — G5762 Lesion of plantar nerve, left lower limb: Secondary | ICD-10-CM | POA: Diagnosis not present

## 2016-02-27 DIAGNOSIS — Z96611 Presence of right artificial shoulder joint: Secondary | ICD-10-CM | POA: Diagnosis not present

## 2016-02-27 DIAGNOSIS — M6281 Muscle weakness (generalized): Secondary | ICD-10-CM | POA: Diagnosis not present

## 2016-03-05 DIAGNOSIS — Z96611 Presence of right artificial shoulder joint: Secondary | ICD-10-CM | POA: Diagnosis not present

## 2016-03-06 DIAGNOSIS — Z96611 Presence of right artificial shoulder joint: Secondary | ICD-10-CM | POA: Diagnosis not present

## 2016-03-07 ENCOUNTER — Ambulatory Visit (INDEPENDENT_AMBULATORY_CARE_PROVIDER_SITE_OTHER): Payer: Medicare Other

## 2016-03-07 DIAGNOSIS — Z7901 Long term (current) use of anticoagulants: Secondary | ICD-10-CM

## 2016-03-07 DIAGNOSIS — I4892 Unspecified atrial flutter: Secondary | ICD-10-CM

## 2016-03-07 LAB — POCT INR: INR: 1.9

## 2016-03-09 DIAGNOSIS — H2513 Age-related nuclear cataract, bilateral: Secondary | ICD-10-CM | POA: Diagnosis not present

## 2016-03-12 DIAGNOSIS — S93602A Unspecified sprain of left foot, initial encounter: Secondary | ICD-10-CM | POA: Diagnosis not present

## 2016-03-14 DIAGNOSIS — F411 Generalized anxiety disorder: Secondary | ICD-10-CM | POA: Diagnosis not present

## 2016-03-14 DIAGNOSIS — M25611 Stiffness of right shoulder, not elsewhere classified: Secondary | ICD-10-CM | POA: Diagnosis not present

## 2016-03-14 DIAGNOSIS — Z96611 Presence of right artificial shoulder joint: Secondary | ICD-10-CM | POA: Diagnosis not present

## 2016-03-16 DIAGNOSIS — M25611 Stiffness of right shoulder, not elsewhere classified: Secondary | ICD-10-CM | POA: Diagnosis not present

## 2016-03-16 DIAGNOSIS — F411 Generalized anxiety disorder: Secondary | ICD-10-CM | POA: Diagnosis not present

## 2016-03-16 DIAGNOSIS — Z96611 Presence of right artificial shoulder joint: Secondary | ICD-10-CM | POA: Diagnosis not present

## 2016-03-16 DIAGNOSIS — M25511 Pain in right shoulder: Secondary | ICD-10-CM | POA: Diagnosis not present

## 2016-03-21 ENCOUNTER — Telehealth: Payer: Self-pay | Admitting: Cardiovascular Disease

## 2016-03-21 NOTE — Telephone Encounter (Signed)
Telephoned pt back and we discussed him starting Zpk. Informed pt that he has any diarrhea or GI upset from ABX calls [place] back but if not there is not a major interaction with Azithromycin and Coumadin.

## 2016-03-21 NOTE — Telephone Encounter (Signed)
Pt wanted to let us know he has started a ZPak. Please call regarding his coumadin.

## 2016-03-23 DIAGNOSIS — M25511 Pain in right shoulder: Secondary | ICD-10-CM | POA: Diagnosis not present

## 2016-03-23 DIAGNOSIS — F411 Generalized anxiety disorder: Secondary | ICD-10-CM | POA: Diagnosis not present

## 2016-03-23 DIAGNOSIS — Z96611 Presence of right artificial shoulder joint: Secondary | ICD-10-CM | POA: Diagnosis not present

## 2016-03-23 DIAGNOSIS — M6281 Muscle weakness (generalized): Secondary | ICD-10-CM | POA: Diagnosis not present

## 2016-03-23 DIAGNOSIS — M25611 Stiffness of right shoulder, not elsewhere classified: Secondary | ICD-10-CM | POA: Diagnosis not present

## 2016-03-27 DIAGNOSIS — J019 Acute sinusitis, unspecified: Secondary | ICD-10-CM | POA: Diagnosis not present

## 2016-03-27 DIAGNOSIS — B9689 Other specified bacterial agents as the cause of diseases classified elsewhere: Secondary | ICD-10-CM | POA: Diagnosis not present

## 2016-03-28 DIAGNOSIS — M25611 Stiffness of right shoulder, not elsewhere classified: Secondary | ICD-10-CM | POA: Diagnosis not present

## 2016-03-28 DIAGNOSIS — Z96611 Presence of right artificial shoulder joint: Secondary | ICD-10-CM | POA: Diagnosis not present

## 2016-03-29 ENCOUNTER — Telehealth: Payer: Self-pay | Admitting: Cardiology

## 2016-03-29 NOTE — Telephone Encounter (Signed)
Agree 

## 2016-03-29 NOTE — Telephone Encounter (Signed)
New message  1. Currently SOB, can here on phone 2. Going on for a few days 3. Sitting and moving 4. Feeling weak, nauseous

## 2016-03-29 NOTE — Telephone Encounter (Signed)
Pt calling stating he was placed on an antibiotic  Was to call us since he is on coumadin Please call back

## 2016-03-29 NOTE — Telephone Encounter (Signed)
Spoke with pt and he started Levaquin 500mg  QD on 03/27/16 for 10 days. Thus, appt made for him to have his INR checked tomorrow in [place] office.

## 2016-03-29 NOTE — Telephone Encounter (Signed)
Pt states that he has increased SOB 3-4 days and increased LE swelling he states that he is up 2#. He does have sinus infx and taking Levaquin for this. Pt is also experiencing increased urination. Pt will take extra lasix for increased LE swelling informed for only 3 days he will weigh daily and will stop when weight/swelling goes down. Pt informed to go to the ER for worsening SOB of any other symptoms. Denies CP or pressure, palpitations or any other symptoms. Pt verbalizes understanding of directions.

## 2016-03-30 ENCOUNTER — Ambulatory Visit (INDEPENDENT_AMBULATORY_CARE_PROVIDER_SITE_OTHER): Payer: Medicare Other | Admitting: *Deleted

## 2016-03-30 DIAGNOSIS — I4892 Unspecified atrial flutter: Secondary | ICD-10-CM

## 2016-03-30 DIAGNOSIS — Z7901 Long term (current) use of anticoagulants: Secondary | ICD-10-CM | POA: Diagnosis not present

## 2016-03-30 LAB — POCT INR: INR: 2.2

## 2016-03-30 NOTE — Progress Notes (Signed)
Called and spoke with Tiffany at [facility] who advised to take 5 mg daily and return next Wednesday for recheck.

## 2016-04-03 NOTE — Progress Notes (Signed)
HPI [name] presents for follow up of his cardiomyopathy and atrial flutter. Since I last saw him he has has been treated for a sinus infection.  He also had shoulder surgery.  The patient started feeling poorly last week. His primary M.D. Treated him for a sinus infection. He has some swelling in his left foot and felt that his buttocks was hot.  He wondered if any of this could be his heart.  He did not have overt heart failure by primary exam. Because he did have a little swelling he was given an extra 2 days of a hole in stead of a half of a Lasix pill. He thinks he's a little bit better since he started antibiotics. He's not having any cough fevers or chills. He does have increased dyspnea however walking 200 feet on level ground. He's not describing PND or orthopnea. He's had no chest pressure, neck or arm discomfort.   Allergies  Allergen Reactions  . Clarithromycin Nausea Only  . Famotidine Other (See Comments)    ABD. PAIN  . Levaquin [Levofloxacin] Other (See Comments)    Stomach pain, has to eat a lot of food  . Omeprazole Diarrhea  . Oxytetracycline Other (See Comments)    BUMPS  . Penicillins Swelling      . Proton Pump Inhibitors Other (See Comments)    GI upset, diarrhea, gas, bloating  . Ranitidine Diarrhea  . Doxycycline Rash    Current Outpatient Prescriptions  Medication Sig Dispense Refill  . acetaminophen (TYLENOL) 500 MG tablet Take 250 mg by mouth daily as needed. Pain    . ALPRAZolam (XANAX) 0.25 MG tablet Take 1 tablet (0.25 mg total) by mouth at bedtime. 60 tablet 0  . alum & mag hydroxide-simeth (MAALOX/MYLANTA) 200-200-20 MG/5ML suspension Take 15 mLs by mouth daily as needed for indigestion or heartburn.    . carvedilol (COREG) 6.25 MG tablet TAKE 1 TABLET TWICE DAILY WITH MEALS 180 tablet 1  . enalapril (VASOTEC) 5 MG tablet TAKE 1 TABLET TWICE DAILY 180 tablet 4  . flunisolide (NASAREL) 29 MCG/ACT (0.025%) nasal spray 2 sprays by Nasal  route daily. Dose is for each nostril.     . fluticasone (FLONASE) 50 MCG/ACT nasal spray Place 1 spray into both nostrils daily.    . furosemide (LASIX) 20 MG tablet TAKE 1/2 TABLET EVERY DAY 45 tablet 3  . guaiFENesin 200 MG tablet Take 400 mg by mouth every 4 (four) hours as needed for cough or to loosen phlegm.    . levalbuterol (XOPENEX HFA) 45 MCG/ACT inhaler Inhale 1-2 puffs into the lungs every 6 (six) hours as needed for wheezing or shortness of breath. 1 Inhaler 1  . Loratadine 10 MG CAPS Take 1 capsule by mouth daily as needed (allergies).     . montelukast (SINGULAIR) 10 MG tablet Take 10 mg by mouth daily as needed (BREATHING).     . polyethylene glycol (MIRALAX / GLYCOLAX) packet Take 17 g by mouth daily as needed.    . sucralfate (CARAFATE) 1 G tablet TAKE 1 TABLET BY MOUTH AS DAILY NEEDED for stomach issues    . warfarin (COUMADIN) 5 MG tablet TAKE AS DIRECTED  BY  ANTICOAGULATION  CLINIC 135 tablet 3  . vitamin B-12 (CYANOCOBALAMIN) 500 MCG tablet Take 500 mg by mouth daily.     No current facility-administered medications for this visit.     Past Medical History:  Diagnosis Date  . AICD (automatic cardioverter/defibrillator) present   .  Allergic rhinitis    [name]  . Anemia   . Anxiety   . Arthritis   . Atrial flutter ([place])    A.  Status post cardioversion; B.  Tikosyn therapy - failed, remains in aflutter  . Bilateral pneumonia 11/02/2013   treated with levaquin  . BPH (benign prostatic hyperplasia)   . 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 Lucky Cowboy)  . Chronic sinusitis   . Chronic systolic congestive heart failure ([place])   . COPD (chronic obstructive pulmonary disease) ([place])    spirometry 2015 - no obstruction, + mild restrictive lung disease  . Extrinsic asthma    [name]  . GERD (gastroesophageal reflux disease) 2010   h/o esophageal stricture with dilation, LA grade C reflux esophagitis by EGD 2010  . Goiter   . History of  diverticulitis of colon   . History of GI bleed    Secondary to hemorrhoids  . IBS (irritable bowel syndrome)   . LBBB (left bundle branch block)   . Multiple pulmonary nodules 06/2013   RUL ([facility] Adam Phenix [facility]) - ?vasculitis as of last CT at [facility]  . NICM (nonischemic cardiomyopathy) [facility])    Cardiac catheterization March 2006 without coronary disease; echocardiogram August 2008: EF 35%, mild AI, left atrial enlargement, EF 2% 2016  . Porphyria ([place])   . Presence of permanent cardiac pacemaker     Past Surgical History:  Procedure Laterality Date  . BACK SURGERY  1997   bulging disks  . BiV-AICD implant     [employer]tronic  . CARDIAC CATHETERIZATION  06/22/04   Severe, nonischemic cardiomyopathy,EF 25-30%  . CARDIOVERSION  02/22/09   AFlutter-([facility])  . CHOLECYSTECTOMY    . COLONOSCOPY  10/99   Divertic, splenic, hepatic fleure only  . COLONOSCOPY  10/21/08   aborted-divertics, int hemms (Dr. Vira Agar)  . CORONARY ANGIOPLASTY    . EP IMPLANTABLE DEVICE N/A 04/25/2015   Procedure: BIV ICD Generator Changeout;  Surgeon: Evans Lance, MD;  Location: [facility];  Service: Cardiovascular;  Laterality: N/A;  . ESOPHAGEAL DILATION  02/18/98  . ESOPHAGOGASTRODUODENOSCOPY  01/1998   stricture, sliding HH, GERD  . ESOPHAGOGASTRODUODENOSCOPY  04/08/01   stricture, gastritis, HH, GERD-no dilation(Dr. Henrene Pastor)  . ESOPHAGOGASTRODUODENOSCOPY  12/22/04   stricture; gastritis; duodenitis, GERD  . ESOPHAGOGASTRODUODENOSCOPY  10/21/08   Reflux esophagitis; Erythem. Duod. (Dr. Vira Agar)  . ESOPHAGOGASTRODUODENOSCOPY  08/2013   erythema gastric fundus - minimal gastritis, HH ([place])  . goiter removal    . HERNIA REPAIR    . HERNIA REPAIR  01/30/02   Dr. Hassell Done  . INSERT / REPLACE / REMOVE PACEMAKER    . NOSE SURGERY  1971  . PENILE PROSTHESIS IMPLANT  2007   [place]  . PFT  10/2013   FVC 61%, FEV1 63%, ratio 0.76  . Pulmonary eval  04/2002   ([facility]) Chronic congestive symptoms  .  REVERSE SHOULDER ARTHROPLASTY Right 01/17/2016   Procedure: REVERSE SHOULDER ARTHROPLASTY;  Surgeon: Corky Mull, MD;  Location: [facility];  Service: Orthopedics;  Laterality: Right;  . REVERSE TOTAL SHOULDER ARTHROPLASTY Right 01/2016   Poggi  . US ECHOCARDIOGRAPHY  07/13/04   EF 25-30%, Mod LVH; LA severe dilation; Mild AR; IRTR  . US ECHOCARDIOGRAPHY  11/27/06   hypokinesis posterior wall, EF 35%; mild AR    ROS:  As stated in the HPI and negative for all other systems.  PHYSICAL EXAM BP 132/78  Pulse 80   Ht 5\' 6"  (1.676 m)  GENERAL:  Well appearingNECK:  Positive jugular venous distention 10 cm with at 45 degrees with HJR., waveform within normal limits, carotid upstroke brisk and symmetric, no bruits, no thyromegaly LUNGS:  Diffuse fine crackles CHEST:  Well healed ICD pocket HEART:  PMI not displaced or sustained,S1 and S2 within normal limits, no S3, no clicks, no rubs, 2 out of 6 apical diastolic murmur short and nonradiating  ABD:  Flat, positive bowel sounds normal in frequency in pitch, no bruits, no rebound, no guarding, no midline pulsatile mass, no hepatomegaly, no splenomegaly EXT:  2 plus pulses throughout, no edema, no cyanosis no clubbing   ASSESSMENT AND PLAN  Nonischemic cardiomyopathy -  I'm going to check a BNP level. I'm going to increase his enalapril 7.5 mg bid.  I do think he might have some heart failure though he doesn't seem to be overtly volume overloaded. He certainly could have some low output symptoms with his significantly low ejection fraction. If he doesn't have significant improvement he might need right heart catheterization and further aggressive therapies. He's actually been quite asymptomatic and well to this point. We do have room to move on his medications though he's been sensitive to drug titrations in the past.  Atrial flutter -  The patient tolerates this rhythm and rate control and anticoagulation. We will continue with the meds as listed.  He does not want to switch from warfarin.    Pulmonary HTN - This was noted at the time of his last echo.  At the next visit I would suggest repeat echocardiography to judge his pulmonary pressures.

## 2016-04-04 ENCOUNTER — Ambulatory Visit (INDEPENDENT_AMBULATORY_CARE_PROVIDER_SITE_OTHER): Payer: Medicare Other

## 2016-04-04 ENCOUNTER — Ambulatory Visit (INDEPENDENT_AMBULATORY_CARE_PROVIDER_SITE_OTHER): Payer: Medicare Other | Admitting: Cardiology

## 2016-04-04 ENCOUNTER — Encounter: Payer: Self-pay | Admitting: Cardiology

## 2016-04-04 VITALS — BP 132/78 | HR 80 | Ht 66.0 in

## 2016-04-04 DIAGNOSIS — Z7901 Long term (current) use of anticoagulants: Secondary | ICD-10-CM

## 2016-04-04 DIAGNOSIS — Z79899 Other long term (current) drug therapy: Secondary | ICD-10-CM | POA: Diagnosis not present

## 2016-04-04 DIAGNOSIS — R0602 Shortness of breath: Secondary | ICD-10-CM

## 2016-04-04 DIAGNOSIS — I4892 Unspecified atrial flutter: Secondary | ICD-10-CM | POA: Diagnosis not present

## 2016-04-04 DIAGNOSIS — I5023 Acute on chronic systolic (congestive) heart failure: Secondary | ICD-10-CM

## 2016-04-04 LAB — BASIC METABOLIC PANEL
BUN: 19 mg/dL (ref 7–25)
CO2: 28 mmol/L (ref 20–31)
Calcium: 8.8 mg/dL (ref 8.6–10.3)
Chloride: 104 mmol/L (ref 98–110)
Creat: 1.19 mg/dL — ABNORMAL HIGH (ref 0.70–1.11)
Glucose, Bld: 81 mg/dL (ref 65–99)
Potassium: 4.4 mmol/L (ref 3.5–5.3)
Sodium: 139 mmol/L (ref 135–146)

## 2016-04-04 LAB — POCT INR: INR: 2.2

## 2016-04-04 MED ORDER — ENALAPRIL MALEATE 5 MG PO TABS: 7.5000 mg | ORAL_TABLET | Freq: Two times a day (BID) | ORAL | 6 refills | Status: DC

## 2016-04-04 MED ORDER — FUROSEMIDE 20 MG PO TABS: 20.0000 mg | ORAL_TABLET | Freq: Every day | ORAL | 6 refills | Status: DC

## 2016-04-04 NOTE — Patient Instructions (Signed)
Medication Instructions:  INCREASE- Enalapril 7.5 mg (1 1/2 tablets) twice a day INCREASE- Furosemide 20 mg (1 Tablets) daily  Labwork: BNP and BMP Today  Testing/Procedures: None Ordered  Follow-Up: Your physician recommends that you schedule a follow-up appointment in: 1 Week with Lurena Joiner or Suanne Marker   Any Other Special Instructions Will Be Listed Below (If Applicable).        [place]   If you need a refill on your cardiac medications before your next appointment, please call your pharmacy.

## 2016-04-05 LAB — BRAIN NATRIURETIC PEPTIDE: Brain Natriuretic Peptide: 538 pg/mL — ABNORMAL HIGH (ref <100)

## 2016-04-06 DIAGNOSIS — M25611 Stiffness of right shoulder, not elsewhere classified: Secondary | ICD-10-CM | POA: Diagnosis not present

## 2016-04-06 DIAGNOSIS — F411 Generalized anxiety disorder: Secondary | ICD-10-CM | POA: Diagnosis not present

## 2016-04-06 DIAGNOSIS — Z96611 Presence of right artificial shoulder joint: Secondary | ICD-10-CM | POA: Diagnosis not present

## 2016-04-06 DIAGNOSIS — M25511 Pain in right shoulder: Secondary | ICD-10-CM | POA: Diagnosis not present

## 2016-04-06 DIAGNOSIS — M6281 Muscle weakness (generalized): Secondary | ICD-10-CM | POA: Diagnosis not present

## 2016-04-11 ENCOUNTER — Ambulatory Visit (INDEPENDENT_AMBULATORY_CARE_PROVIDER_SITE_OTHER): Payer: Medicare Other | Admitting: Physician Assistant

## 2016-04-11 ENCOUNTER — Encounter: Payer: Self-pay | Admitting: Physician Assistant

## 2016-04-11 VITALS — BP 122/82 | HR 83 | Ht 66.0 in | Wt 142.6 lb

## 2016-04-11 DIAGNOSIS — Z79899 Other long term (current) drug therapy: Secondary | ICD-10-CM | POA: Diagnosis not present

## 2016-04-11 DIAGNOSIS — M25611 Stiffness of right shoulder, not elsewhere classified: Secondary | ICD-10-CM | POA: Diagnosis not present

## 2016-04-11 DIAGNOSIS — I482 Chronic atrial fibrillation: Secondary | ICD-10-CM

## 2016-04-11 DIAGNOSIS — I4891 Unspecified atrial fibrillation: Secondary | ICD-10-CM | POA: Diagnosis not present

## 2016-04-11 DIAGNOSIS — I5023 Acute on chronic systolic (congestive) heart failure: Secondary | ICD-10-CM

## 2016-04-11 DIAGNOSIS — M25511 Pain in right shoulder: Secondary | ICD-10-CM | POA: Diagnosis not present

## 2016-04-11 DIAGNOSIS — I4821 Permanent atrial fibrillation: Secondary | ICD-10-CM

## 2016-04-11 DIAGNOSIS — Z96611 Presence of right artificial shoulder joint: Secondary | ICD-10-CM | POA: Diagnosis not present

## 2016-04-11 DIAGNOSIS — F411 Generalized anxiety disorder: Secondary | ICD-10-CM | POA: Diagnosis not present

## 2016-04-11 LAB — BASIC METABOLIC PANEL
BUN: 19 mg/dL (ref 7–25)
CO2: 26 mmol/L (ref 20–31)
Calcium: 9.1 mg/dL (ref 8.6–10.3)
Chloride: 103 mmol/L (ref 98–110)
Creat: 1.06 mg/dL (ref 0.70–1.11)
Glucose, Bld: 88 mg/dL (ref 65–99)
Potassium: 4.9 mmol/L (ref 3.5–5.3)
Sodium: 140 mmol/L (ref 135–146)

## 2016-04-11 NOTE — Patient Instructions (Signed)
Medication Instructions:  INCREASE LASIX TO 2 TABLETS DAILY (40MG ) FOR 3 DAYS-GOAL WEIGHT AT HOME IS 130 POUNDS   If you need a refill on your cardiac medications before your next appointment, please call your pharmacy.  Labwork: BMET AT [facility] ON THE 1ST FLOOR  Testing/Procedures: Your physician has requested that you have an echocardiogram. Echocardiography is a painless test that uses sound waves to create images of your heart. It provides your doctor with information about the size and shape of your heart and how well your heart's chambers and valves are working. This procedure takes approximately one hour. There are no restrictions for this procedure.  Follow-Up: Your physician recommends that you schedule a follow-up appointment in: IN ONE WEEK-AFTER ECHO FOR REVIEW   Special Instructions: IF BREATHING DOESN'T GET BETTER CALL FOR APPOINTMENT TO DISCUSS  CONTINUE DAILY WEIGHTS AT HOME    Thank you for choosing [facility] at [facility]!!    Sharyn Lull, LPN RHONDA BARRETT, PA-C

## 2016-04-11 NOTE — Progress Notes (Signed)
Cardiology Office Note   Date:  04/11/2016   ID:  [name], DOB Apr 22, 1931, MRN [id number]  PCP:  Ria Bush, MD  Cardiologist:  Dr [name] 04/04/2016  Rosaria Ferries, PA-C   Chief Complaint  Patient presents with  . Follow-up    History of Present Illness: [name] is a 83 y.o. male with a history of chronic systolic CHF, atrial flutter, CHB w/ MDT BiV ICD, NICM w/ EF 20-25% by echo 2016, COPD, GERD, BPH, IBS  12/27, seen by Dr [name], enalapril 5>7.5 mg bid and Lasix 10>20 mg qd, ?R heart cath if he does not improve, echo recommended to ck pulm pressures (prev 60)  [name] presents for follow up.  He is breathing some better than when he saw Dr [name], but not at baseline. Denies PND, orthopnea. Still has daytime edema, but less (L>R). Can walk to the mailbox, but gets SOB doing it. Other activities are limited due to SOB. He has not been at a normal activity level since the shoulder surgery.   He still has a small amount of yellowish nasal drainage every am and had some bloody drainage this am (very little). No fevers, no sig cough.  He noticed an increase in urination with the Lasix. His weight was 139 lbs on home scales at the last visit, now 137.4 lbs.   He wonders if increasing the rate on his ICD would help. He wonders if it has gotten stronger. He is compliant with a low sodium diet. He never has palpitations or presyncope, his ICD has not fired.   Past Medical History:  Diagnosis Date  . AICD (automatic cardioverter/defibrillator) present    s/p gen change 04/2015 w/ MDT [name] Crt-D  DTBA1D1, Serial number [id number]  . Allergic rhinitis    [name]  . Anemia   . Anxiety   . Arthritis   . Atrial flutter ([place])    A.  Status post cardioversion; B.  Tikosyn therapy - failed, remains in aflutter  . Bilateral pneumonia 11/02/2013   treated with levaquin  . BPH (benign prostatic hyperplasia)   . Celiac artery  aneurysm ([place]) 10/2011   1.2 cm, rec f/u 6 mo (Dr. Lucky Cowboy)  . Chronic sinusitis   . Chronic systolic congestive heart failure ([place])   . COPD (chronic obstructive pulmonary disease) ([place])    spirometry 2015 - no obstruction, + mild restrictive lung disease  . Extrinsic asthma    [name]  . GERD (gastroesophageal reflux disease) 2010   h/o esophageal stricture with dilation, LA grade C reflux esophagitis by EGD 2010  . Goiter   . History of diverticulitis of colon   . History of GI bleed    Secondary to hemorrhoids  . IBS (irritable bowel syndrome)   . LBBB (left bundle branch block)   . Multiple pulmonary nodules 06/2013   RUL ([facility] Adam [facility]ix [facility]) - ?vasculitis as of last CT [facility]  . NICM (nonischemic cardiomyopathy) [facility])    Cardiac catheterization March 2006 without coronary disease; echocardiogram August 2008: EF 35%, mild AI, left atrial enlargement, EF 20-25% 09/2014  . Porphyria ([place])   . Presence of permanent cardiac pacemaker     Past Surgical History:  Procedure Laterality Date  . BACK SURGERY  1997   bulging disks  . BiV-AICD implant     [employer]tr[employer]  . CARDIAC CATHETERIZATION  06/22/04   Severe, nonischemic cardiomyopathy,EF 25-30%  . CARDIOVERSION  02/22/09  AFlutter-([facility])  . CHOLECYSTECTOMY    . COLONOSCOPY  10/99   Divertic, splenic, hepatic fleure only  . COLONOSCOPY  10/21/08   aborted-divertics, int hemms (Dr. Vira Agar)  . CORONARY ANGIOPLASTY    . EP IMPLANTABLE DEVICE N/A 04/25/2015   Procedure: BIV ICD Generator Changeout;  Surgeon: Evans Lance, MD;  Location: [facility];  Service: Cardiovascular;  Laterality: N/A;  . ESOPHAGEAL DILATION  02/18/98  . ESOPHAGOGASTRODUODENOSCOPY  01/1998   stricture, sliding HH, GERD  . ESOPHAGOGASTRODUODENOSCOPY  04/08/01   stricture, gastritis, HH, GERD-no dilation(Dr. Henrene Pastor)  . ESOPHAGOGASTRODUODENOSCOPY  12/22/04   stricture; gastritis; duodenitis, GERD  . ESOPHAGOGASTRODUODENOSCOPY  10/21/08    Reflux esophagitis; Erythem. Duod. (Dr. Vira Agar)  . ESOPHAGOGASTRODUODENOSCOPY  08/2013   erythema gastric fundus - minimal gastritis, HH ([place])  . goiter removal    . HERNIA REPAIR    . HERNIA REPAIR  01/30/02   Dr. Hassell Done  . INSERT / REPLACE / REMOVE PACEMAKER    . NOSE SURGERY  1971  . PENILE PROSTHESIS IMPLANT  2007   [place]  . PFT  10/2013   FVC 61%, FEV1 63%, ratio 0.76  . Pulmonary eval  04/2002   ([facility]) Chronic congestive symptoms  . REVERSE SHOULDER ARTHROPLASTY Right 01/17/2016   Procedure: REVERSE SHOULDER ARTHROPLASTY;  Surgeon: Corky Mull, MD;  Location: [facility];  Service: Orthopedics;  Laterality: Right;  . REVERSE TOTAL SHOULDER ARTHROPLASTY Right 01/2016   Poggi  . US ECHOCARDIOGRAPHY  07/13/04   EF 25-30%, Mod LVH; LA severe dilation; Mild AR; IRTR  . US ECHOCARDIOGRAPHY  11/27/06   hypokinesis posterior wall, EF 35%; mild AR    Current Outpatient Prescriptions  Medication Sig Dispense Refill  . acetaminophen (TYLENOL) 500 MG tablet Take 250 mg by mouth daily as needed. Pain    . ALPRAZolam (XANAX) 0.25 MG tablet Take 1 tablet (0.25 mg total) by mouth at bedtime. 60 tablet 0  . alum & mag hydroxide-simeth (MAALOX/MYLANTA) 200-200-20 MG/5ML suspension Take 15 mLs by mouth daily as needed for indigestion or heartburn.    . carvedilol (COREG) 6.25 MG tablet TAKE 1 TABLET TWICE DAILY WITH MEALS 180 tablet 1  . enalapril (VASOTEC) 5 MG tablet Take 1.5 tablets (7.5 mg total) by mouth 2 (two) times daily. 90 tablet 6  . fluticasone (FLONASE) 50 MCG/ACT nasal spray Place 1 spray into both nostrils daily.    . furosemide (LASIX) 20 MG tablet Take 1 tablet (20 mg total) by mouth daily. 30 tablet 6  . guaiFENesin 200 MG tablet Take 400 mg by mouth every 4 (four) hours as needed for cough or to loosen phlegm.    . levalbuterol (XOPENEX HFA) 45 MCG/ACT inhaler Inhale 1-2 puffs into the lungs every 6 (six) hours as needed for wheezing or shortness of breath. 1 Inhaler 1  .  Loratadine 10 MG CAPS Take 1 capsule by mouth daily as needed (allergies).     . montelukast (SINGULAIR) 10 MG tablet Take 10 mg by mouth daily as needed (BREATHING).     . polyethylene glycol (MIRALAX / GLYCOLAX) packet Take 17 g by mouth daily as needed.    . sucralfate (CARAFATE) 1 G tablet TAKE 1 TABLET BY MOUTH AS DAILY NEEDED for stomach issues    . vitamin B-12 (CYANOCOBALAMIN) 500 MCG tablet Take 500 mg by mouth daily.    Marland Kitchen warfarin (COUMADIN) 5 MG tablet TAKE AS DIRECTED  BY  ANTICOAGULATION  CLINIC 135 tablet 3   No  current facility-administered medications for this visit.     Allergies:   Clarithromycin; Famotidine; Levaquin [levofloxacin]; Omeprazole; Oxytetracycline; Penicillins; Proton pump inhibitors; Ranitidine; and Doxycycline    Social History:  The patient  reports that he has never smoked. He has never used smokeless tobacco. He reports that he does not drink alcohol or use drugs.   Family History:  The patient's family history includes Alcohol abuse in his maternal uncle; Coronary artery disease in his father; Dysphagia in his sister; Heart failure in his mother; Hypertension in his father; Other in his sister, sister, and sister.    ROS:  Please see the history of present illness. All other systems are reviewed and negative.    PHYSICAL EXAM: VS:  BP 122/82   Pulse 83   Ht 5\' 6"  (1.676 m)   Wt 142 lb 9.6 oz (64.7 kg)   BMI 23.02 kg/m  , BMI Body mass index is 23.02 kg/m. GEN: Well nourished, well developed, male in no acute distress  HEENT: normal for age  Neck: JVD 11 cm, no carotid bruit, no masses Cardiac: RRR; + diastolic murmur, no rubs, or gallops Respiratory: decreased BS bases but clear to auscultation bilaterally, normal work of breathing GI: soft, nontender, nondistended, + BS MS: no deformity or atrophy; no edema; distal pulses are 2+ in all 4 extremities   Skin: warm and dry, no rash Neuro:  Strength and sensation are intact Psych: euthymic  mood, full affect   EKG:  EKG is not ordered today.  ECHO: 09/2014 - Left ventricle: The cavity size was mildly dilated. Wall   thickness was normal. Systolic function was severely reduced. The   estimated ejection fraction was in the range of 20% to 25%.   Diffuse hypokinesis. Doppler parameters are consistent with   restrictive physiology, indicative of decreased left ventricular   diastolic compliance and/or increased left atrial pressure. - Aortic valve: There was mild regurgitation. - Mitral valve: There was mild regurgitation. - Left atrium: The atrium was severely dilated. - Right atrium: The atrium was moderately dilated. - Pulmonary arteries: Systolic pressure was moderately to severely   increased. PA peak pressure: 60 mm Hg (S).   Recent Labs: 01/18/2016: Hemoglobin 12.2; Platelets 94 04/04/2016: Brain Natriuretic Peptide 538.0; BUN 19; Creat 1.19; Potassium 4.4; Sodium 139    Lipid Panel    Component Value Date/Time   CHOL 115 09/01/2013 0507   TRIG 120 09/01/2013 0507   HDL 47.80 08/03/2013 0825   CHOLHDL 4 08/03/2013 0825   VLDL 28.0 08/03/2013 0825   LDLCALC 96 08/03/2013 0825     Wt Readings from Last 3 Encounters:  04/11/16 142 lb 9.6 oz (64.7 kg)  01/18/16 135 lb (61.2 kg)  01/06/16 135 lb (61.2 kg)     Other studies Reviewed: Additional studies/ records that were reviewed today include: office notes and testing.  ASSESSMENT AND PLAN:  1.  Acute on chronic systolic CHF: Per his records, he lost 2 lbs after the last office visit. Feel his dry weight is 135 lbs on his scales. Will increase Lasix to 40 mg qd x 3 days and pt is to get a BMET today. He is to continue daily weights and low Na diet. Ck echo per Dr Hochrein's note. Since he got improvement with the increase in Lasix, do not think a R heart cath is needed at this time.  2. Deconditioning: Pt had shoulder surgery and then a sinus infection. His activity level has been decreased for a  while.  He is encouraged to increase his activity as tolerated, but be aware that he needs to pace himself and make allowances to give him time to rest when needed.   3. ICD: MDT device followed by Dr Lovena Le. Will ask if optimization would help him.   Current medicines are reviewed at length with the patient today.  The patient does not have concerns regarding medicines.  The following changes have been made:  Increase Lasix  Labs/ tests ordered today include:    Orders Placed This Encounter  Procedures  . Basic metabolic panel  . ECHOCARDIOGRAM COMPLETE     Disposition:   FU with Dr [name] and Dr Lovena Le.  Augusto Garbe  04/11/2016 10:58 AM    [place] Phone: [phone]; Fax: [phone]  This note was written with the assistance of speech recognition software. Please excuse any transcriptional errors.

## 2016-04-13 ENCOUNTER — Other Ambulatory Visit: Payer: Self-pay

## 2016-04-13 DIAGNOSIS — F411 Generalized anxiety disorder: Secondary | ICD-10-CM | POA: Diagnosis not present

## 2016-04-13 DIAGNOSIS — Z96611 Presence of right artificial shoulder joint: Secondary | ICD-10-CM | POA: Diagnosis not present

## 2016-04-13 DIAGNOSIS — M25611 Stiffness of right shoulder, not elsewhere classified: Secondary | ICD-10-CM | POA: Diagnosis not present

## 2016-04-13 MED ORDER — ALPRAZOLAM 0.25 MG PO TABS: 0.2500 mg | ORAL_TABLET | Freq: Every day | ORAL | 0 refills | Status: DC

## 2016-04-13 NOTE — Telephone Encounter (Signed)
V/M left requesting refill xanax to [facility]. Last refilled # 60 on 12/23/15. Last annual 04/12/15. No future appt scheduled.

## 2016-04-13 NOTE — Telephone Encounter (Signed)
plz phone in. 

## 2016-04-13 NOTE — Telephone Encounter (Signed)
Rx called in as directed.   

## 2016-04-18 DIAGNOSIS — M6281 Muscle weakness (generalized): Secondary | ICD-10-CM | POA: Diagnosis not present

## 2016-04-18 DIAGNOSIS — M25511 Pain in right shoulder: Secondary | ICD-10-CM | POA: Diagnosis not present

## 2016-04-18 DIAGNOSIS — F411 Generalized anxiety disorder: Secondary | ICD-10-CM | POA: Diagnosis not present

## 2016-04-18 DIAGNOSIS — Z96611 Presence of right artificial shoulder joint: Secondary | ICD-10-CM | POA: Diagnosis not present

## 2016-04-23 DIAGNOSIS — Z96611 Presence of right artificial shoulder joint: Secondary | ICD-10-CM | POA: Diagnosis not present

## 2016-04-24 DIAGNOSIS — H903 Sensorineural hearing loss, bilateral: Secondary | ICD-10-CM | POA: Diagnosis not present

## 2016-04-24 DIAGNOSIS — J329 Chronic sinusitis, unspecified: Secondary | ICD-10-CM | POA: Diagnosis not present

## 2016-04-24 DIAGNOSIS — J340 Abscess, furuncle and carbuncle of nose: Secondary | ICD-10-CM | POA: Diagnosis not present

## 2016-04-27 ENCOUNTER — Other Ambulatory Visit (HOSPITAL_COMMUNITY): Payer: Self-pay

## 2016-05-02 ENCOUNTER — Ambulatory Visit (INDEPENDENT_AMBULATORY_CARE_PROVIDER_SITE_OTHER): Payer: Medicare Other

## 2016-05-02 DIAGNOSIS — Z7901 Long term (current) use of anticoagulants: Secondary | ICD-10-CM | POA: Diagnosis not present

## 2016-05-02 DIAGNOSIS — I4892 Unspecified atrial flutter: Secondary | ICD-10-CM | POA: Diagnosis not present

## 2016-05-02 LAB — POCT INR: INR: 3.6

## 2016-05-03 ENCOUNTER — Other Ambulatory Visit: Payer: Self-pay

## 2016-05-03 ENCOUNTER — Ambulatory Visit (INDEPENDENT_AMBULATORY_CARE_PROVIDER_SITE_OTHER): Payer: Medicare Other

## 2016-05-03 DIAGNOSIS — I482 Chronic atrial fibrillation: Secondary | ICD-10-CM

## 2016-05-03 DIAGNOSIS — I4821 Permanent atrial fibrillation: Secondary | ICD-10-CM

## 2016-05-04 ENCOUNTER — Encounter: Payer: Self-pay | Admitting: Physician Assistant

## 2016-05-04 ENCOUNTER — Ambulatory Visit (INDEPENDENT_AMBULATORY_CARE_PROVIDER_SITE_OTHER): Payer: Medicare Other | Admitting: Physician Assistant

## 2016-05-04 VITALS — BP 118/68 | HR 68 | Ht 66.0 in | Wt 141.4 lb

## 2016-05-04 DIAGNOSIS — I4821 Permanent atrial fibrillation: Secondary | ICD-10-CM

## 2016-05-04 DIAGNOSIS — Z79899 Other long term (current) drug therapy: Secondary | ICD-10-CM | POA: Diagnosis not present

## 2016-05-04 DIAGNOSIS — I5022 Chronic systolic (congestive) heart failure: Secondary | ICD-10-CM

## 2016-05-04 DIAGNOSIS — I482 Chronic atrial fibrillation: Secondary | ICD-10-CM

## 2016-05-04 DIAGNOSIS — Z7901 Long term (current) use of anticoagulants: Secondary | ICD-10-CM | POA: Diagnosis not present

## 2016-05-04 MED ORDER — CARVEDILOL 6.25 MG PO TABS: 9.3750 mg | ORAL_TABLET | Freq: Two times a day (BID) | ORAL | 3 refills | Status: DC

## 2016-05-04 MED ORDER — ENALAPRIL MALEATE 5 MG PO TABS
7.5000 mg | ORAL_TABLET | Freq: Two times a day (BID) | ORAL | 3 refills | Status: DC
Start: 2016-05-04 — End: 2016-11-06

## 2016-05-04 NOTE — Progress Notes (Signed)
Cardiology Office Note   Date:  05/04/2016   ID:  [name], DOB 12/23/1931, MRN [id number]  PCP:  Ria Bush, MD  Cardiologist:  Dr. [name], 04/04/2016  EP: Dr Brayton Mars, PA-C 04/11/2016  Chief Complaint  Patient presents with  . Follow-up    History of Present Illness: [name] is a 83 y.o. male with a history of chronic systolic CHF, atrial flutter, CHB w/ MDT BiV ICD, NICM w/ EF 20-25% by echo at [facility] 2016, COPD, GERD, BPH, IBS  12/27, seen by Dr [name], enalapril 5>7.5 mg bid and Lasix 10>20 mg qd, ?R heart cath if he does not improve, echo recommended to ck pulm pressures (prev 60) 01/03, seen by me, weight 142 pounds, dry weight felt to be 135 pounds, Lasix increased for 3 days  [name] presents for cardiology follow up.  His breathing and swelling got better after increasing Lasix for 3 days. He is working more in the [employer]. He gets a little SOB when he gets up or moves suddenly, but otherwise does pretty well. He is not having chest pain or SOB, he does not have palpitations. He gets up in the night for urination, but not for SOB. He denies orthopnea or PND. He has not had any chest pain. He feels he is at his dry weight.  He is worried about being shocked By his ICD, but agrees that short-term resuscitation is ok. He is very concerned about quality of life. He does not want to live on a ventilator.   Past Medical History:  Diagnosis Date  . AICD (automatic cardioverter/defibrillator) present    s/p gen change 04/2015 w/ MDT [name] Crt-D  DTBA1D1, Serial number [id number]  . Allergic rhinitis    [name]  . Anemia   . Anxiety   . Arthritis   . Atrial flutter ([place])    A.  Status post cardioversion; B.  Tikosyn therapy - failed, remains in aflutter  . Bilateral pneumonia 11/02/2013   treated with levaquin  . BPH (benign prostatic hyperplasia)   . 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 Lucky Cowboy)  . Chronic sinusitis   . Chronic systolic congestive heart failure ([place])   . COPD (chronic obstructive pulmonary disease) ([place])    spirometry 2015 - no obstruction, + mild restrictive lung disease  . Extrinsic asthma    [name]  . GERD (gastroesophageal reflux disease) 2010   h/o esophageal stricture with dilation, LA grade C reflux esophagitis by EGD 2010  . Goiter   . History of diverticulitis of colon   . History of GI bleed    Secondary to hemorrhoids  . IBS (irritable bowel syndrome)   . LBBB (left bundle branch block)   . Multiple pulmonary nodules 06/2013   RUL ([facility]. Adam Phenix [facility]) - ?vasculitis as of last CT at [facility]  . NICM (nonischemic cardiomyopathy) [facility])    Cardiac catheterization March 2006 without coronary disease; echocardiogram August 2008: EF 35%, mild AI, left atrial enlargement, EF 20-25% 09/2014  . Porphyria ([place])   . Presence of permanent cardiac pacemaker     Past Surgical History:  Procedure Laterality Date  . BACK SURGERY  1997   bulging disks  . BiV-AICD implant     [employer]  . CARDIAC CATHETERIZATION  06/22/04   Severe, nonischemic cardiomyopathy,EF 25-30%  . CARDIOVERSION  02/22/09   AFlutter-([facility])  . CHOLECYSTECTOMY    .  COLONOSCOPY  10/99   Divertic, splenic, hepatic fleure only  . COLONOSCOPY  10/21/08   aborted-divertics, int hemms (Dr. Vira Agar)  . CORONARY ANGIOPLASTY    . EP IMPLANTABLE DEVICE N/A 04/25/2015   Procedure: BIV ICD Generator Changeout;  Surgeon: Evans Lance, MD;  Location: [place];  Service: Cardiovascular;  Laterality: N/A;  . ESOPHAGEAL DILATION  02/18/98  . ESOPHAGOGASTRODUODENOSCOPY  01/1998   stricture, sliding HH, GERD  . ESOPHAGOGASTRODUODENOSCOPY  04/08/01   stricture, gastritis, HH, GERD-no dilation(Dr. Henrene Pastor)  . ESOPHAGOGASTRODUODENOSCOPY  12/22/04   stricture; gastritis; duodenitis, GERD  . ESOPHAGOGASTRODUODENOSCOPY  10/21/08   Reflux esophagitis; Erythem. Duod. (Dr.  Vira Agar)  . ESOPHAGOGASTRODUODENOSCOPY  08/2013   erythema gastric fundus - minimal gastritis, HH ([place])  . goiter removal    . HERNIA REPAIR    . HERNIA REPAIR  01/30/02   Dr. Hassell Done  . INSERT / REPLACE / REMOVE PACEMAKER    . NOSE SURGERY  1971  . PENILE PROSTHESIS IMPLANT  2007   [place]  . PFT  10/2013   FVC 61%, FEV1 63%, ratio 0.76  . Pulmonary eval  04/2002   ([facility]) Chronic congestive symptoms  . REVERSE SHOULDER ARTHROPLASTY Right 01/17/2016   Procedure: REVERSE SHOULDER ARTHROPLASTY;  Surgeon: Corky Mull, MD;  Location: [facility] ORS;  Service: Orthopedics;  Laterality: Right;  . REVERSE TOTAL SHOULDER ARTHROPLASTY Right 01/2016   [place]  . US ECHOCARDIOGRAPHY  07/13/04   EF 25-30%, Mod LVH; LA severe dilation; Mild AR; IRTR  . US ECHOCARDIOGRAPHY  11/27/06   hypokinesis posterior wall, EF 35%; mild AR    Current Outpatient Prescriptions  Medication Sig Dispense Refill  . acetaminophen (TYLENOL) 500 MG tablet Take 250 mg by mouth daily as needed. Pain    . ALPRAZolam (XANAX) 0.25 MG tablet Take 1 tablet (0.25 mg total) by mouth at bedtime. 60 tablet 0  . alum & mag hydroxide-simeth (MAALOX/MYLANTA) 200-200-20 MG/5ML suspension Take 15 mLs by mouth daily as needed for indigestion or heartburn.    . budesonide-formoterol (SYMBICORT) 160-4.5 MCG/ACT inhaler Inhale 2 puffs into the lungs at bedtime.    . carvedilol (COREG) 6.25 MG tablet TAKE 1 TABLET TWICE DAILY WITH MEALS 180 tablet 1  . enalapril (VASOTEC) 5 MG tablet Take 1.5 tablets (7.5 mg total) by mouth 2 (two) times daily. 90 tablet 6  . fluticasone (FLONASE) 50 MCG/ACT nasal spray Place 1 spray into both nostrils daily.    . furosemide (LASIX) 20 MG tablet Take 1 tablet (20 mg total) by mouth daily. 30 tablet 6  . guaiFENesin 200 MG tablet Take 400 mg by mouth every 4 (four) hours as needed for cough or to loosen phlegm.    . levalbuterol (XOPENEX HFA) 45 MCG/ACT inhaler Inhale 1-2 puffs into the lungs every 6 (six) hours as  needed for wheezing or shortness of breath. 1 Inhaler 1  . Loratadine 10 MG CAPS Take 1 capsule by mouth daily as needed (allergies).     . montelukast (SINGULAIR) 10 MG tablet Take 10 mg by mouth daily as needed (BREATHING).     . polyethylene glycol (MIRALAX / GLYCOLAX) packet Take 17 g by mouth daily as needed.    . sucralfate (CARAFATE) 1 G tablet TAKE 1 TABLET BY MOUTH AS DAILY NEEDED for stomach issues    . vitamin B-12 (CYANOCOBALAMIN) 500 MCG tablet Take 500 mg by mouth daily.    Marland Kitchen warfarin (COUMADIN) 5 MG tablet TAKE AS DIRECTED  BY  ANTICOAGULATION  CLINIC 135 tablet 3   No current facility-administered medications for this visit.     Allergies:   Clarithromycin; Famotidine; Levaquin [levofloxacin]; Omeprazole; Oxytetracycline; Penicillins; Proton pump inhibitors; Ranitidine; and Doxycycline    Social History:  The patient  reports that he has never smoked. He has never used smokeless tobacco. He reports that he does not drink alcohol or use drugs.   Family History:  The patient's family history includes Alcohol abuse in his maternal uncle; Coronary artery disease in his father; Dysphagia in his sister; Heart failure in his mother; Hypertension in his father; Other in his sister, sister, and sister.    ROS:  Please see the history of present illness. All other systems are reviewed and negative.    PHYSICAL EXAM: VS:  BP 118/68   Pulse 68   Ht 5\' 6"  (1.676 m)   Wt 141 lb 6.4 oz (64.1 kg)   BMI 22.82 kg/m  , BMI Body mass index is 22.82 kg/m. GEN: Well nourished, well developed, male in no acute distress  HEENT: normal for age  Neck: JVD9 cm, no carotid bruit, no masses Cardiac: RRR; Soft diastolic murmur, no rubs, or gallops Respiratory:  clear to auscultation bilaterally, normal work of breathing GI: soft, nontender, nondistended, + BS MS: no deformity or atrophy; no edema; distal pulses are 2+ in all 4 extremities   Skin: warm and dry, no rash Neuro:  Strength and  sensation are intact Psych: euthymic mood, full affect   EKG:  EKG is not ordered today.  ECHO: 05/03/2016 - Left ventricle: The cavity size was moderately dilated. There was   mild concentric hypertrophy. Systolic function was severely   reduced. The estimated ejection fraction was less than 20%.   Diffuse hypokinesis. Doppler parameters are consistent with   restrictive physiology, indicative of decreased left ventricular   diastolic compliance and/or increased left atrial pressure. - Aortic valve: There was mild regurgitation. - Mitral valve: Calcified annulus. There was mild regurgitation. - Left atrium: The atrium was severely dilated. - Right atrium: The atrium was severely dilated. - Pulmonary arteries: Systolic pressure was moderately to severely   increased. PA peak pressure: 65 mm Hg (S). Impressions: - No significant change from echo 08/2014.   Recent Labs: 01/18/2016: Hemoglobin 12.2; Platelets 94 04/04/2016: Brain Natriuretic Peptide 538.0 04/11/2016: BUN 19; Creat 1.06; Potassium 4.9; Sodium 140    Lipid Panel    Component Value Date/Time   CHOL 115 09/01/2013 0507   TRIG 120 09/01/2013 0507   HDL 47.80 08/03/2013 0825   CHOLHDL 4 08/03/2013 0825   VLDL 28.0 08/03/2013 0825   LDLCALC 96 08/03/2013 0825     Wt Readings from Last 3 Encounters:  05/04/16 141 lb 6.4 oz (64.1 kg)  04/11/16 142 lb 9.6 oz (64.7 kg)  01/18/16 135 lb (61.2 kg)     Other studies Reviewed: Additional studies/ records that were reviewed today include: Office notes and testing.  ASSESSMENT AND PLAN:  1.  Chronic systolic CHF: His weight is down on his home scales. He gained several pounds overnight and isn't sure why, but feels his breathing is at baseline. The importance of watching this and keeping an eye on his sodium intake was emphasized. He is compliant with his medications.  I reviewed the echocardiogram with he and his wife. They are aware that his EF is lower than it used  to be. His PAS is still high and he has JVD, but his lungs sound good and  his lower extremity edema has improved. His functional status is still very good. He is encouraged to do everything that he wants to do, just pace himself. If his dyspnea on exertion gets worse, he is to contact us.  2. Permanent atrial fibrillation: His device is followed by Dr. Lovena Le. He is not having any palpitations and has not had any shocks. When he sees Dr. Lovena Le, consider optimization of his device  3. Chronic anticoagulation: He is not having any bleeding issues on the Coumadin.   Current medicines are reviewed at length with the patient today.  The patient does not have concerns regarding medicines.  The following changes have been made:  Increase Coreg  Labs/ tests ordered today include:  No orders of the defined types were placed in this encounter.   Disposition:   FU with Dr. [name] and Dr. Lovena Le.  Augusto Garbe  05/04/2016 11:16 AM    [place] Phone: [phone]; Fax: [phone]  This note was written with the assistance of speech recognition software. Please excuse any transcriptional errors.

## 2016-05-04 NOTE — Patient Instructions (Signed)
Medication Instructions:  INCREASE coreg to 9.375 mg (1.5 tablets) two times a day.  Labwork: Have labwork completed today (BMET).  Testing/Procedures: NONE  Follow-Up: Your physician wants you to follow-up in: 3 MONTHS WITH DR. HOCHREIN AND IN MAY WITH DR Lovena Le AS PLANNED. You will receive a reminder letter in the mail two months in advance. If you don't receive a letter, please call our office to schedule the follow-up appointment.   Any Other Special Instructions Will Be Listed Below (If Applicable).  CONTINUE WITH LOW SODIUM DIET AND DAILY WEIGHTS.   If you need a refill on your cardiac medications before your next appointment, please call your pharmacy.

## 2016-05-05 LAB — BASIC METABOLIC PANEL
BUN: 20 mg/dL (ref 7–25)
CO2: 24 mmol/L (ref 20–31)
Calcium: 8.9 mg/dL (ref 8.6–10.3)
Chloride: 103 mmol/L (ref 98–110)
Creat: 1.04 mg/dL (ref 0.70–1.11)
Glucose, Bld: 88 mg/dL (ref 65–99)
Potassium: 4.7 mmol/L (ref 3.5–5.3)
Sodium: 138 mmol/L (ref 135–146)

## 2016-05-16 ENCOUNTER — Ambulatory Visit (INDEPENDENT_AMBULATORY_CARE_PROVIDER_SITE_OTHER): Payer: Medicare Other

## 2016-05-16 DIAGNOSIS — I4892 Unspecified atrial flutter: Secondary | ICD-10-CM

## 2016-05-16 DIAGNOSIS — Z7901 Long term (current) use of anticoagulants: Secondary | ICD-10-CM | POA: Diagnosis not present

## 2016-05-16 LAB — POCT INR: INR: 2.6

## 2016-06-13 ENCOUNTER — Ambulatory Visit (INDEPENDENT_AMBULATORY_CARE_PROVIDER_SITE_OTHER): Payer: Medicare Other

## 2016-06-13 DIAGNOSIS — I4892 Unspecified atrial flutter: Secondary | ICD-10-CM

## 2016-06-13 DIAGNOSIS — Z7901 Long term (current) use of anticoagulants: Secondary | ICD-10-CM | POA: Diagnosis not present

## 2016-06-13 LAB — POCT INR: INR: 2.2

## 2016-06-29 ENCOUNTER — Ambulatory Visit (INDEPENDENT_AMBULATORY_CARE_PROVIDER_SITE_OTHER): Payer: Medicare Other

## 2016-06-29 VITALS — BP 108/70 | HR 64 | Temp 97.4°F | Ht 65.0 in | Wt 140.8 lb

## 2016-06-29 DIAGNOSIS — D696 Thrombocytopenia, unspecified: Secondary | ICD-10-CM | POA: Diagnosis not present

## 2016-06-29 DIAGNOSIS — Z0000 Encounter for general adult medical examination without abnormal findings: Secondary | ICD-10-CM

## 2016-06-29 DIAGNOSIS — E538 Deficiency of other specified B group vitamins: Secondary | ICD-10-CM | POA: Diagnosis not present

## 2016-06-29 LAB — CBC WITH DIFFERENTIAL/PLATELET
Basophils Absolute: 0.1 10*3/uL (ref 0.0–0.1)
Basophils Relative: 1.7 % (ref 0.0–3.0)
Eosinophils Absolute: 0.4 10*3/uL (ref 0.0–0.7)
Eosinophils Relative: 6.4 % — ABNORMAL HIGH (ref 0.0–5.0)
HCT: 37.7 % — ABNORMAL LOW (ref 39.0–52.0)
Hemoglobin: 12.6 g/dL — ABNORMAL LOW (ref 13.0–17.0)
Lymphocytes Relative: 26 % (ref 12.0–46.0)
Lymphs Abs: 1.7 10*3/uL (ref 0.7–4.0)
MCHC: 33.5 g/dL (ref 30.0–36.0)
MCV: 84.7 fl (ref 78.0–100.0)
Monocytes Absolute: 0.5 10*3/uL (ref 0.1–1.0)
Monocytes Relative: 7.4 % (ref 3.0–12.0)
Neutro Abs: 3.8 10*3/uL (ref 1.4–7.7)
Neutrophils Relative %: 58.5 % (ref 43.0–77.0)
Platelets: 132 10*3/uL — ABNORMAL LOW (ref 150.0–400.0)
RBC: 4.45 Mil/uL (ref 4.22–5.81)
RDW: 16.5 % — ABNORMAL HIGH (ref 11.5–15.5)
WBC: 6.5 10*3/uL (ref 4.0–10.5)

## 2016-06-29 LAB — VITAMIN B12: Vitamin B-12: 488 pg/mL (ref 211–911)

## 2016-06-29 NOTE — Patient Instructions (Signed)
[name] , Thank you for taking time to come for your Medicare Wellness Visit. I appreciate your ongoing commitment to your health goals. Please review the following plan we discussed and let me know if I can assist you in the future.   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       This is a list of the screening recommended for you and due dates:  Health Maintenance  Topic Date Due  . DTaP/Tdap/Td vaccine (1 - Tdap) 02/17/2020*  . Flu Shot  06/29/2025*  . Pneumonia vaccines (1 of 2 - PCV13) 06/29/2025*  . Tetanus Vaccine  02/17/2020  *Topic was postponed. The date shown is not the original due date.   Preventive Care for Adults  A healthy lifestyle and preventive care can promote health and wellness. Preventive health guidelines for adults include the following key practices.  . A routine yearly physical is a good way to check with your health care provider about your health and preventive screening. It is a chance to share any concerns and updates on your health and to receive a thorough exam.  . Visit your dentist for a routine exam and preventive care every 6 months. Brush your teeth twice a day and floss once a day. Good oral hygiene prevents tooth decay and gum disease.  . The frequency of eye exams is based on your age, health, family medical history, use  of contact lenses, and other factors. Follow your health care provider's ecommendations for frequency of eye exams.  . Eat a healthy diet. Foods like vegetables, fruits, whole grains, low-fat dairy products, and lean protein foods contain the nutrients you need without too many calories. Decrease your intake of foods high in solid fats, added sugars, and salt. Eat the right amount of calories for you. Get information about a proper diet from your health care provider, if necessary.  . Regular physical exercise is one of the most important things  you can do for your health. Most adults should get at least 150 minutes of moderate-intensity exercise (any activity that increases your heart rate and causes you to sweat) each week. In addition, most adults need muscle-strengthening exercises on 2 or more days a week.  Silver Sneakers may be a benefit available to you. To determine eligibility, you may visit the website: www.silversneakers.com or contact program at 302-639-9936 Mon-Fri between 8AM-8PM.   . Maintain a healthy weight. The body mass index (BMI) is a screening tool to identify possible weight problems. It provides an estimate of body fat based on height and weight. Your health care provider can find your BMI and can help you achieve or maintain a healthy weight.   For adults 20 years and older: ? A BMI below 18.5 is considered underweight. ? A BMI of 18.5 to 24.9 is normal. ? A BMI of 25 to 29.9 is considered overweight. ? A BMI of 30 and above is considered obese.   . Maintain normal blood lipids and cholesterol levels by exercising and minimizing your intake of saturated fat. Eat a balanced diet with plenty of fruit and vegetables. Blood tests for lipids and cholesterol should begin at age 88 and be repeated every 5 years. If your lipid or cholesterol levels are high, you are over 50, or you are at high risk for heart disease, you may need your cholesterol levels checked more frequently. Ongoing high lipid  and cholesterol levels should be treated with medicines if diet and exercise are not working.  . If you smoke, find out from your health care provider how to quit. If you do not use tobacco, please do not start.  . If you choose to drink alcohol, please do not consume more than 2 drinks per day. One drink is considered to be 12 ounces (355 mL) of beer, 5 ounces (148 mL) of wine, or 1.5 ounces (44 mL) of liquor.  . If you are 62-24 years old, ask your health care provider if you should take aspirin to prevent strokes.  . Use  sunscreen. Apply sunscreen liberally and repeatedly throughout the day. You should seek shade when your shadow is shorter than you. Protect yourself by wearing long sleeves, pants, a wide-brimmed hat, and sunglasses year round, whenever you are outdoors.  . Once a month, do a whole body skin exam, using a mirror to look at the skin on your back. Tell your health care provider of new moles, moles that have irregular borders, moles that are larger than a pencil eraser, or moles that have changed in shape or color.

## 2016-06-29 NOTE — Progress Notes (Signed)
Pre visit review using our clinic review tool, if applicable. No additional management support is needed unless otherwise documented below in the visit note. 

## 2016-06-29 NOTE — Progress Notes (Signed)
Subjective:   [name] is a 83 y.o. male who presents for Medicare Annual/Subsequent preventive examination.  Review of Systems:  N/A Cardiac Risk Factors include: advanced age (>61men, >20 women);male gender     Objective:    Vitals: BP 108/70 (BP Location: Right Arm, Patient Position: Sitting, Cuff Size: Normal)   Pulse 64   Temp 97.4 F (36.3 C) (Oral)   Ht 5\' 5"  (1.651 m) Comment: no shoes  Wt 140 lb 12 oz (63.8 kg)   SpO2 95%   BMI 23.42 kg/m   Body mass index is 23.42 kg/m.  Tobacco History  Smoking Status  . Never Smoker  Smokeless Tobacco  . Never Used     Counseling given: No   Past Medical History:  Diagnosis Date  . AICD (automatic cardioverter/defibrillator) present    s/p gen change 04/2015 w/ MDT [name] Crt-D  DTBA1D1, Serial number [id number]  . Allergic rhinitis    [name]  . Anemia   . Anxiety   . Arthritis   . Atrial flutter ([place])    A.  Status post cardioversion; B.  Tikosyn therapy - failed, remains in aflutter  . Bilateral pneumonia 11/02/2013   treated with levaquin  . BPH (benign prostatic hyperplasia)   . 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 Lucky Cowboy)  . Chronic sinusitis   . Chronic systolic congestive heart failure ([place])   . COPD (chronic obstructive pulmonary disease) ([place])    spirometry 2015 - no obstruction, + mild restrictive lung disease  . Extrinsic asthma    [name]  . GERD (gastroesophageal reflux disease) 2010   h/o esophageal stricture with dilation, LA grade C reflux esophagitis by EGD 2010  . Goiter   . History of diverticulitis of colon   . History of GI bleed    Secondary to hemorrhoids  . IBS (irritable bowel syndrome)   . LBBB (left bundle branch block)   . Multiple pulmonary nodules 06/2013   RUL ([facility] Adam Phenix [facility]) - ?vasculitis as of last CT at [facility]  . NICM (nonischemic cardiomyopathy) [facility])    Cardiac catheterization March 2006 without coronary disease;  echocardiogram August 2008: EF 35%, mild AI, left atrial enlargement, EF 20-25% 09/2014  . Porphyria ([place])   . Presence of permanent cardiac pacemaker    Past Surgical History:  Procedure Laterality Date  . BACK SURGERY  1997   bulging disks  . BiV-AICD implant     [employer]  . CARDIAC CATHETERIZATION  06/22/04   Severe, nonischemic cardiomyopathy,EF 25-30%  . CARDIOVERSION  02/22/09   AFlutter-([facility])  . CHOLECYSTECTOMY    . COLONOSCOPY  10/99   Divertic, splenic, hepatic fleure only  . COLONOSCOPY  10/21/08   aborted-divertics, int hemms (Dr. Vira Agar)  . CORONARY ANGIOPLASTY    . EP IMPLANTABLE DEVICE N/A 04/25/2015   Procedure: BIV ICD Generator Changeout;  Surgeon: Evans Lance, MD;  Location: [facility];  Service: Cardiovascular;  Laterality: N/A;  . ESOPHAGEAL DILATION  02/18/98  . ESOPHAGOGASTRODUODENOSCOPY  01/1998   stricture, sliding HH, GERD  . ESOPHAGOGASTRODUODENOSCOPY  04/08/01   stricture, gastritis, HH, GERD-no dilation(Dr. Henrene Pastor)  . ESOPHAGOGASTRODUODENOSCOPY  12/22/04   stricture; gastritis; duodenitis, GERD  . ESOPHAGOGASTRODUODENOSCOPY  10/21/08   Reflux esophagitis; Erythem. Duod. (Dr. Vira Agar)  . ESOPHAGOGASTRODUODENOSCOPY  08/2013   erythema gastric fundus - minimal gastritis, HH ([place])  . goiter removal    . HERNIA REPAIR    .  HERNIA REPAIR  01/30/02   Dr. Hassell Done  . INSERT / REPLACE / REMOVE PACEMAKER    . NOSE SURGERY  1971  . PENILE PROSTHESIS IMPLANT  2007   [place]  . PFT  10/2013   FVC 61%, FEV1 63%, ratio 0.76  . Pulmonary eval  04/2002   ([facility]) Chronic congestive symptoms  . REVERSE SHOULDER ARTHROPLASTY Right 01/17/2016   Procedure: REVERSE SHOULDER ARTHROPLASTY;  Surgeon: Corky Mull, MD;  Location: [facility] ORS;  Service: Orthopedics;  Laterality: Right;  . REVERSE TOTAL SHOULDER ARTHROPLASTY Right 01/2016   [place]  . US ECHOCARDIOGRAPHY  07/13/04   EF 25-30%, Mod LVH; LA severe dilation; Mild AR; IRTR  . US ECHOCARDIOGRAPHY  11/27/06    hypokinesis posterior wall, EF 35%; mild AR   Family History  Problem Relation Age of Onset  . Coronary artery disease Father   . Hypertension Father   . Heart failure Mother   . Dysphagia Sister   . Breast cancer    . Ovarian cancer    . Uterine cancer    . Other Sister     stomach problems  . Other Sister     stomach problems  . Other Sister     stomach problems  . Alcohol abuse Maternal Uncle    History  Sexual Activity  . Sexual activity: No    Outpatient Encounter Prescriptions as of 06/29/2016  Medication Sig  . acetaminophen (TYLENOL) 500 MG tablet Take 250 mg by mouth daily as needed. Pain  . ALPRAZolam (XANAX) 0.25 MG tablet Take 1 tablet (0.25 mg total) by mouth at bedtime.  Marland Kitchen alum & mag hydroxide-simeth (MAALOX/MYLANTA) 200-200-20 MG/5ML suspension Take 15 mLs by mouth daily as needed for indigestion or heartburn.  . budesonide-formoterol (SYMBICORT) 160-4.5 MCG/ACT inhaler Inhale 2 puffs into the lungs at bedtime.  . carvedilol (COREG) 6.25 MG tablet Take 1.5 tablets (9.375 mg total) by mouth 2 (two) times daily with a meal.  . enalapril (VASOTEC) 5 MG tablet Take 1.5 tablets (7.5 mg total) by mouth 2 (two) times daily.  . fluticasone (FLONASE) 50 MCG/ACT nasal spray Place 1 spray into both nostrils daily.  . furosemide (LASIX) 20 MG tablet Take 1 tablet (20 mg total) by mouth daily.  Marland Kitchen guaiFENesin 200 MG tablet Take 400 mg by mouth every 4 (four) hours as needed for cough or to loosen phlegm.  . levalbuterol (XOPENEX HFA) 45 MCG/ACT inhaler Inhale 1-2 puffs into the lungs every 6 (six) hours as needed for wheezing or shortness of breath.  . Loratadine 10 MG CAPS Take 1 capsule by mouth daily as needed (allergies).   . montelukast (SINGULAIR) 10 MG tablet Take 10 mg by mouth daily as needed (BREATHING).   . polyethylene glycol (MIRALAX / GLYCOLAX) packet Take 17 g by mouth daily as needed.  . sucralfate (CARAFATE) 1 G tablet TAKE 1 TABLET BY MOUTH AS DAILY NEEDED for  stomach issues  . vitamin B-12 (CYANOCOBALAMIN) 500 MCG tablet Take 500 mg by mouth daily.  Marland Kitchen warfarin (COUMADIN) 5 MG tablet TAKE AS DIRECTED  BY  ANTICOAGULATION  CLINIC   No facility-administered encounter medications on file as of 06/29/2016.     Activities of Daily Living In your present state of health, do you have any difficulty performing the following activities: 06/29/2016 01/18/2016  Hearing? Y N  Vision? N N  Difficulty concentrating or making decisions? N N  Walking or climbing stairs? N N  Dressing or bathing? N Y  Doing  errands, shopping? N N  Preparing Food and eating ? N -  Using the Toilet? N -  In the past six months, have you accidently leaked urine? N -  Do you have problems with loss of bowel control? N -  Managing your Medications? N -  Managing your Finances? N -  Housekeeping or managing your Housekeeping? N -  Some recent data might be hidden    Patient Care Team: Ria Bush, MD as PCP - General Evans Lance, MD as Consulting Physician (Cardiology)   Assessment:    Hearing Screening Comments: Wears sister's hearing aids  Vision Screening Comments: Last vision exam in 2017 @ [facility]  Exercise Activities and Dietary recommendations Current Exercise Habits: The patient has a physically strenous job, but has no regular exercise apart from work. 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       Fall Risk Fall Risk  06/29/2016 04/12/2015 04/12/2015 03/16/2014 04/22/2012  Falls in the past year? No No No No No   Depression Screen PHQ 2/9 Scores 06/29/2016 04/12/2015 03/16/2014 04/22/2012  PHQ - 2 Score 0 0 0 0    Cognitive Function MMSE - Mini Mental State Exam 06/29/2016  Orientation to time 5  Orientation to Place 5  Registration 3  Attention/ Calculation 0  Recall 3    Language- name 2 objects 0  Language- repeat 1  Language- follow 3 step command 3  Language- read & follow direction 0  Write a sentence 0  Copy design 0  Total score 20     PLEASE NOTE: A Mini-Cog screen was completed. Maximum score is 20. A value of 0 denotes this part of [place] MMSE was not completed or the patient failed this part of the Mini-Cog screening.   Mini-Cog Screening Orientation to Time - Max 5 pts Orientation to Place - Max 5 pts Registration - Max 3 pts Recall - Max 3 pts Language Repeat - Max 1 pts Language Follow 3 Step Command - Max 3 pts     Immunization History  Administered Date(s) Administered  . Td 11/08/1995, 02/16/2010  . Zoster 05/31/2006   Screening Tests Health Maintenance  Topic Date Due  . DTaP/Tdap/Td (1 - Tdap) 02/17/2020 (Originally 02/17/2010)  . INFLUENZA VACCINE  06/29/2025 (Originally 11/08/2015)  . PNA vac Low Risk Adult (1 of 2 - PCV13) 06/29/2025 (Originally 09/21/1998)  . TETANUS/TDAP  02/17/2020      Plan:     I have personally reviewed and addressed the Medicare Annual Wellness questionnaire and have noted the following in the patient's chart:  A. Medical and social history B. Use of alcohol, tobacco or illicit drugs  C. Current medications and supplements D. Functional ability and status E.  Nutritional status F.  Physical activity G. Advance directives H. List of other physicians I.  Hospitalizations, surgeries, and ER visits in previous 12 months J.  [place] to include hearing, vision, cognitive, depression L. Referrals and appointments - none  In addition, I have reviewed and discussed with patient certain preventive protocols, quality metrics, and best practice recommendations. A written personalized care plan for preventive services as well as general preventive health recommendations were provided to patient.  See attached scanned questionnaire for additional information.   Signed,  Lindell Noe, MHA, BS, LPN Health Coach

## 2016-06-29 NOTE — Progress Notes (Signed)
PCP notes:   Health maintenance:  Flu vaccine - pt declined PNA vaccine - pt declined  Abnormal screenings:   None  Patient concerns:   None  Nurse concerns:  None  Next PCP appt:   07/12/2016 @ 1245

## 2016-07-01 NOTE — Progress Notes (Signed)
I reviewed health advisor's note, was available for consultation, and agree with documentation and plan.  

## 2016-07-02 LAB — PATHOLOGIST SMEAR REVIEW

## 2016-07-09 ENCOUNTER — Encounter: Payer: Self-pay | Admitting: Cardiology

## 2016-07-11 ENCOUNTER — Ambulatory Visit (INDEPENDENT_AMBULATORY_CARE_PROVIDER_SITE_OTHER): Payer: Medicare Other

## 2016-07-11 DIAGNOSIS — I4892 Unspecified atrial flutter: Secondary | ICD-10-CM

## 2016-07-11 DIAGNOSIS — Z7901 Long term (current) use of anticoagulants: Secondary | ICD-10-CM

## 2016-07-11 LAB — POCT INR: INR: 2.1

## 2016-07-12 ENCOUNTER — Encounter: Payer: Self-pay | Admitting: Family Medicine

## 2016-07-12 ENCOUNTER — Ambulatory Visit (INDEPENDENT_AMBULATORY_CARE_PROVIDER_SITE_OTHER): Payer: Medicare Other | Admitting: Family Medicine

## 2016-07-12 VITALS — BP 128/74 | HR 62 | Temp 97.4°F | Wt 145.0 lb

## 2016-07-12 DIAGNOSIS — F419 Anxiety disorder, unspecified: Secondary | ICD-10-CM

## 2016-07-12 DIAGNOSIS — Z96611 Presence of right artificial shoulder joint: Secondary | ICD-10-CM | POA: Diagnosis not present

## 2016-07-12 DIAGNOSIS — S51811A Laceration without foreign body of right forearm, initial encounter: Secondary | ICD-10-CM | POA: Diagnosis not present

## 2016-07-12 DIAGNOSIS — Z95810 Presence of automatic (implantable) cardiac defibrillator: Secondary | ICD-10-CM | POA: Diagnosis not present

## 2016-07-12 DIAGNOSIS — R0981 Nasal congestion: Secondary | ICD-10-CM | POA: Diagnosis not present

## 2016-07-12 DIAGNOSIS — D696 Thrombocytopenia, unspecified: Secondary | ICD-10-CM

## 2016-07-12 DIAGNOSIS — I728 Aneurysm of other specified arteries: Secondary | ICD-10-CM | POA: Diagnosis not present

## 2016-07-12 DIAGNOSIS — E538 Deficiency of other specified B group vitamins: Secondary | ICD-10-CM

## 2016-07-12 DIAGNOSIS — I429 Cardiomyopathy, unspecified: Secondary | ICD-10-CM

## 2016-07-12 DIAGNOSIS — I4892 Unspecified atrial flutter: Secondary | ICD-10-CM

## 2016-07-12 DIAGNOSIS — I5022 Chronic systolic (congestive) heart failure: Secondary | ICD-10-CM

## 2016-07-12 MED ORDER — ALPRAZOLAM 0.25 MG PO TABS: 0.2500 mg | ORAL_TABLET | Freq: Every day | ORAL | 0 refills | Status: DC | PRN

## 2016-07-12 MED ORDER — ALPRAZOLAM 0.25 MG PO TABS: 0.2500 mg | ORAL_TABLET | Freq: Every evening | ORAL | 0 refills | Status: DC | PRN

## 2016-07-12 NOTE — Assessment & Plan Note (Signed)
Pt satisfied with surgery with improved ROM and decreased pain.

## 2016-07-12 NOTE — Assessment & Plan Note (Signed)
Wound dressed. Home care discussed.

## 2016-07-12 NOTE — Assessment & Plan Note (Signed)
Rate controlled.  On coumadin.   

## 2016-07-12 NOTE — Assessment & Plan Note (Signed)
I don't see that this has recently been imaged. Will discuss with patient at next OV

## 2016-07-12 NOTE — Assessment & Plan Note (Signed)
Maintaining with daily 534mcg orally

## 2016-07-12 NOTE — Patient Instructions (Addendum)
Increase nasal saline. Xanax refilled - printed today.  Continue b12 518mcg daily.  You are doing well today.  Return as needed or in 1 year for next medicare wellness and follow up. Think about pneumonia shots.

## 2016-07-12 NOTE — Progress Notes (Signed)
BP 128/74   Pulse 62   Temp 97.4 F (36.3 C) (Oral)   Wt 145 lb (65.8 kg)   SpO2 99%   BMI 24.13 kg/m    CC: f/u visit Subjective:    Patient ID: [name], male    DOB: 11-12-1931, 83 y.o.   MRN: [id number]  HPI: [name] is a 83 y.o. male presenting on 07/12/2016 for Annual Exam   Saw Katha Cabal last week for medicare wellness visit. Note reviewed.   Suffered skin tear R forearm yesterday.   B12 deficiency - completed IM injections, transitioned to oral replacement.  Chronic systolic CHF followed closely by cardiology. EF down to 18%. Has defibrillator/pacer.  Chronic sinus congestion - followed by ENT. To use nasal spray.  s/p R reverse shoulder replacement with improvement of pain and mobility, but with ongoing ache worse at night time.   Requests xanax refill - takes 1/2 tab PRN sleep.   Preventative: Colonoscopy - 2010 Tiffany Kocher) aborted early 2/2 significant diverticulosis. Prostate cancer - aged out  Flu shot - declines  Pneumococcal vaccine - declines  Td 2011. Declines rpt after recent skin tear zostavax 2008  Advanced directive: Wouldn't want prolonged life support. Wife would be medical decision maker. Has defibrillator in place. On [place] registry card - file # [id number].  Seat belt use discussed Sunscreen use discussed. No changing moles on skin.  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 Interim medical history since our last visit reviewed. Allergies and medications reviewed and updated. Outpatient Medications Prior to Visit  Medication Sig Dispense Refill  . acetaminophen (TYLENOL) 500 MG tablet Take 250 mg by  mouth daily as needed. Pain    . alum & mag hydroxide-simeth (MAALOX/MYLANTA) 200-200-20 MG/5ML suspension Take 15 mLs by mouth daily as needed for indigestion or heartburn.    . budesonide-formoterol (SYMBICORT) 160-4.5 MCG/ACT inhaler Inhale 2 puffs into the lungs at bedtime.    . carvedilol (COREG) 6.25 MG tablet Take 1.5 tablets (9.375 mg total) by mouth 2 (two) times daily with a meal. 270 tablet 3  . enalapril (VASOTEC) 5 MG tablet Take 1.5 tablets (7.5 mg total) by mouth 2 (two) times daily. 270 tablet 3  . fluticasone (FLONASE) 50 MCG/ACT nasal spray Place 1 spray into both nostrils daily.    . furosemide (LASIX) 20 MG tablet Take 1 tablet (20 mg total) by mouth daily. 30 tablet 6  . guaiFENesin 200 MG tablet Take 400 mg by mouth every 4 (four) hours as needed for cough or to loosen phlegm.    . levalbuterol (XOPENEX HFA) 45 MCG/ACT inhaler Inhale 1-2 puffs into the lungs every 6 (six) hours as needed for wheezing or shortness of breath. 1 Inhaler 1  . Loratadine 10 MG CAPS Take 1 capsule by mouth daily as needed (allergies).     . montelukast (SINGULAIR) 10 MG tablet Take 10 mg by mouth daily as needed (BREATHING).     . polyethylene glycol (MIRALAX / GLYCOLAX) packet Take 17 g by mouth daily as needed.    . vitamin B-12 (CYANOCOBALAMIN) 500 MCG tablet Take 500 mg by mouth daily.    Marland Kitchen warfarin (COUMADIN) 5 MG tablet TAKE AS DIRECTED  BY  ANTICOAGULATION  CLINIC 135 tablet 3  . ALPRAZolam (XANAX) 0.25 MG tablet Take 1 tablet (0.25 mg total) by mouth at bedtime. 60 tablet 0  . sucralfate (CARAFATE) 1 G tablet TAKE 1 TABLET BY MOUTH AS DAILY NEEDED for stomach issues     No facility-administered medications prior to visit.      Per HPI unless specifically indicated in ROS section below Review of Systems     Objective:    BP 128/74   Pulse 62   Temp 97.4 F (36.3 C) (Oral)   Wt 145 lb (65.8 kg)   SpO2 99%   BMI 24.13 kg/m   Wt Readings from Last 3 Encounters:  07/12/16 145 lb  (65.8 kg)  06/29/16 140 lb 12 oz (63.8 kg)  05/04/16 141 lb 6.4 oz (64.1 kg)    Physical Exam  Constitutional: He is oriented to person, place, and time. He appears well-developed and well-nourished. No distress.  HENT:  Head: Normocephalic and atraumatic.  Right Ear: Hearing, tympanic membrane, external ear and ear canal normal.  Left Ear: Hearing, tympanic membrane, external ear and ear canal normal.  Nose: Nose normal.  Mouth/Throat: Uvula is midline, oropharynx is clear and moist and mucous membranes are normal. No oropharyngeal exudate, posterior oropharyngeal edema or posterior oropharyngeal erythema.  Eyes: Conjunctivae and EOM are normal. Pupils are equal, round, and reactive to light. No scleral icterus.  Neck: Normal range of motion. Neck supple.  Cardiovascular: Normal rate, regular rhythm and intact distal pulses.   Murmur (2/6 diastolic m best at sternal border) heard. Pulses:      Radial pulses are 2+ on the right side, and 2+ on the left side.  Pulmonary/Chest: Effort normal and breath sounds normal. No respiratory distress. He has no wheezes. He has no rales.  Abdominal: Soft. Bowel sounds are normal. He exhibits no distension and no mass. There is no tenderness. There is no rebound and no guarding.  Musculoskeletal: Normal range of motion. He exhibits no edema.  Lymphadenopathy:    He has no cervical adenopathy.  Neurological: He is alert and oriented to person, place, and time.  CN grossly intact, station and gait intact  Skin: Skin is warm and dry. No rash noted.  Dog ear shaped skin tear R dorsal forearm, cleaned and dressed with abx ointment and bandaid.   Psychiatric: He has a normal mood and affect. His behavior is normal. Judgment and thought content normal.  Nursing note and vitals reviewed.  Results for orders placed or performed in visit on 07/11/16  POCT INR  Result Value Ref Range   INR 2.1       Assessment & Plan:   Problem List Items Addressed This  Visit    Anxiety    Chronic, stable on rare PRN xanax. Refilled today.       Relevant Medications   ALPRAZolam (XANAX) 0.25 MG tablet   Atrial flutter ([facility])    Rate controlled. On coumadin.       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 Will discuss with patient at next OV      Chronic systolic heart failure ([place])    Appreciate cards care. Reviewed latest note. Coreg was increased.       Secondary cardiomyopathy ([place])    Followed by cardiology.  Sinus congestion    Chronic, ongoing. No signs of bacterial infection. rec increase nasal saline irrigation throughout the day.       Skin tear of forearm without complication, right, initial encounter    Wound dressed. Home care discussed.       Status post reverse total arthroplasty of right shoulder    Pt satisfied with surgery with improved ROM and decreased pain.       Thrombocytopenia ([facility])    Chronic, stable. Reviewed recent periph smear.           Follow up plan: Return in about 1 year (around 07/12/2017) for follow up visit, medicare wellness visit.  Ria Bush, MD

## 2016-07-12 NOTE — Assessment & Plan Note (Signed)
Appreciate cards care. Reviewed latest note. Coreg was increased.

## 2016-07-12 NOTE — Progress Notes (Signed)
Pre visit review using our clinic review tool, if applicable. No additional management support is needed unless otherwise documented below in the visit note. 

## 2016-07-12 NOTE — Assessment & Plan Note (Signed)
Chronic, stable on rare PRN xanax. Refilled today.

## 2016-07-12 NOTE — Assessment & Plan Note (Signed)
Followed by cardiology 

## 2016-07-12 NOTE — Assessment & Plan Note (Signed)
Chronic, stable. Reviewed recent periph smear.

## 2016-07-12 NOTE — Assessment & Plan Note (Signed)
Chronic, ongoing. No signs of bacterial infection. rec increase nasal saline irrigation throughout the day.

## 2016-07-23 NOTE — Progress Notes (Signed)
HPI [name] presents for follow up of his cardiomyopathy and atrial flutter. Since I last saw him he has has seen [name] PAc a couple of times for increased volume and had to have diuretics increased.  He was volume overloaded when I saw him last but increased diuretic seemed to have helped this by the time of the last appt.    Since I last saw him he has done well.  The patient denies any new symptoms such as chest discomfort, neck or arm discomfort. There has been no new shortness of breath, PND or orthopnea. There have been no reported palpitations, presyncope or syncope.  He planted all of the tomatoes and then had to cover them yesterday.  400 plants!    Allergies  Allergen Reactions  . Clarithromycin Nausea Only  . Famotidine Other (See Comments)    ABD. PAIN  . Levaquin [Levofloxacin] Other (See Comments)    Stomach pain, has to eat a lot of food  . Omeprazole Diarrhea  . Oxytetracycline Other (See Comments)    BUMPS  . Penicillins Swelling      . Proton Pump Inhibitors Other (See Comments)    GI upset, diarrhea, gas, bloating  . Ranitidine Diarrhea  . Doxycycline Rash    Current Outpatient Prescriptions  Medication Sig Dispense Refill  . acetaminophen (TYLENOL) 500 MG tablet Take 250 mg by mouth daily as needed. Pain    . ALPRAZolam (XANAX) 0.25 MG tablet Take 1 tablet (0.25 mg total) by mouth daily as needed for anxiety. 30 tablet 0  . alum & mag hydroxide-simeth (MAALOX/MYLANTA) 200-200-20 MG/5ML suspension Take 15 mLs by mouth daily as needed for indigestion or heartburn.    . budesonide-formoterol (SYMBICORT) 160-4.5 MCG/ACT inhaler Inhale 2 puffs into the lungs at bedtime.    . carvedilol (COREG) 6.25 MG tablet Take 1.5 tablets (9.375 mg total) by mouth 2 (two) times daily with a meal. 270 tablet 3  . enalapril (VASOTEC) 5 MG tablet Take 1.5 tablets (7.5 mg total) by mouth 2 (two) times daily. 270 tablet 3  . fluticasone (FLONASE) 50 MCG/ACT  nasal spray Place 1 spray into both nostrils daily.    . furosemide (LASIX) 20 MG tablet Take 1 tablet (20 mg total) by mouth daily. 30 tablet 6  . guaiFENesin 200 MG tablet Take 400 mg by mouth every 4 (four) hours as needed for cough or to loosen phlegm.    . levalbuterol (XOPENEX HFA) 45 MCG/ACT inhaler Inhale 1-2 puffs into the lungs every 6 (six) hours as needed for wheezing or shortness of breath. 1 Inhaler 1  . Loratadine 10 MG CAPS Take 1 capsule by mouth daily as needed (allergies).     . montelukast (SINGULAIR) 10 MG tablet Take 10 mg by mouth daily as needed (BREATHING).     . polyethylene glycol (MIRALAX / GLYCOLAX) packet Take 17 g by mouth daily as needed.    . vitamin B-12 (CYANOCOBALAMIN) 500 MCG tablet Take 500 mg by mouth daily.    Marland Kitchen warfarin (COUMADIN) 5 MG tablet TAKE AS DIRECTED  BY  ANTICOAGULATION  CLINIC 135 tablet 3   No current facility-administered medications for this visit.     Past Medical History:  Diagnosis Date  . AICD (automatic cardioverter/defibrillator) present    s/p gen change 04/2015 w/ MDT [name] Crt-D  DTBA1D1, Serial number [id number]  . Allergic rhinitis    [name]  . Anemia   . Anxiety   . Arthritis   .  Atrial flutter ([place])    A.  Status post cardioversion; B.  Tikosyn therapy - failed, remains in aflutter  . Bilateral pneumonia 11/02/2013   treated with levaquin  . BPH (benign prostatic hyperplasia)   . 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 Lucky Cowboy)  . Chronic sinusitis   . Chronic systolic congestive heart failure ([place])   . COPD (chronic obstructive pulmonary disease) ([place])    spirometry 2015 - no obstruction, + mild restrictive lung disease  . Extrinsic asthma    [name]  . GERD (gastroesophageal reflux disease) 2010   h/o esophageal stricture with dilation, LA grade C reflux esophagitis by EGD 2010  . Goiter   . History of diverticulitis of colon   . History of GI bleed    Secondary to hemorrhoids  . IBS  (irritable bowel syndrome)   . LBBB (left bundle branch block)   . Multiple pulmonary nodules 06/2013   RUL ([facility] Adam [facility]ix [facility]) - ?vasculitis as of last CT [facility]  . NICM (nonischemic cardiomyopathy) [facility])    Cardiac catheterization March 2006 without coronary disease; echocardiogram August 2008: EF 35%, mild AI, left atrial enlargement, EF 20-25% 09/2014  . [facility])     Past Surgical History:  Procedure Laterality Date  . BACK SURGERY  1997   bulging disks  . BiV-AICD implant     Medtronic  . CARDIAC CATHETERIZATION  06/22/04   Severe, nonischemic cardiomyopathy,EF 25-30%  . CARDIOVERSION  02/22/09   AFlutter-([facility])  . CHOLECYSTECTOMY    . COLONOSCOPY  10/99   Divertic, splenic, hepatic fleure only  . COLONOSCOPY  10/21/08   aborted-divertics, int hemms (Dr. Vira Agar)  . CORONARY ANGIOPLASTY    . EP IMPLANTABLE DEVICE N/A 04/25/2015   Procedure: BIV ICD Generator Changeout;  Surgeon: Evans Lance, MD;  Location: [place] [facility];  Service: Cardiovascular;  Laterality: N/A;  . ESOPHAGEAL DILATION  02/18/98  . ESOPHAGOGASTRODUODENOSCOPY  01/1998   stricture, sliding HH, GERD  . ESOPHAGOGASTRODUODENOSCOPY  04/08/01   stricture, gastritis, HH, GERD-no dilation(Dr. Henrene Pastor)  . ESOPHAGOGASTRODUODENOSCOPY  12/22/04   stricture; gastritis; duodenitis, GERD  . ESOPHAGOGASTRODUODENOSCOPY  10/21/08   Reflux esophagitis; Erythem. Duod. (Dr. Vira Agar)  . ESOPHAGOGASTRODUODENOSCOPY  08/2013   erythema gastric fundus - minimal gastritis, HH ([place])  . goiter removal    . HERNIA REPAIR    . HERNIA REPAIR  01/30/02   Dr. Hassell Done  . INSERT / REPLACE / REMOVE PACEMAKER    . NOSE SURGERY  1971  . PENILE PROSTHESIS IMPLANT  2007   Ot[place]  . PFT  10/2013   FVC 61%, FEV1 63%, ratio 0.76  . Pulmonary eval  04/2002   ([facility]) Chronic congestive symptoms  . REVERSE SHOULDER ARTHROPLASTY Right 01/17/2016   Procedure: REVERSE SHOULDER ARTHROPLASTY;  Surgeon: Corky Mull, MD;   Location: [facility] ORS;  Service: Orthopedics;  Laterality: Right;  . REVERSE TOTAL SHOULDER ARTHROPLASTY Right 01/2016   Poggi  . US ECHOCARDIOGRAPHY  07/13/04   EF 25-30%, Mod LVH; LA severe dilation; Mild AR; IRTR  . US ECHOCARDIOGRAPHY  11/27/06   hypokinesis posterior wall, EF 35%; mild AR    ROS:  As stated in the HPI and negative for all other systems.  PHYSICAL EXAM BP 108/60   Pulse 69   Ht 5\' 6"  (1.676 m)   Wt 144 lb (65.3 kg)   BMI 23.24 kg/m  GENERAL:  Well appearing NECK:  Positive jugular venous distention 6 cm with at 45 degrees with HJR., waveform within normal limits, carotid upstroke brisk and symmetric, no bruits, no thyromegaly LUNGS:  Diffuse fine crackles CHEST:  Well healed ICD pocket HEART:  PMI not displaced or sustained,S1 and S2 within normal limits, no S3, no clicks, no rubs, 2 out of 6 apical diastolic murmur short and nonradiating.  Unchanged from previous exam.   ABD:  Flat, positive bowel sounds normal in frequency in pitch, no bruits, no rebound, no guarding, no midline pulsatile mass, no hepatomegaly, no splenomegaly EXT:  2 plus pulses throughout, no edema, no cyanosis no clubbing  EKG:  Atrial fibrillation, rate 69, ventricular pacing.  07/24/2016  ASSESSMENT AND PLAN  Nonischemic cardiomyopathy -  I he seems to be euvolemic. He sometimes doesn't take oral corticosteroids been prescribed and I told he should do this. Otherwise no change in therapy is suggested.   Atrial flutter -  The patient tolerates this rhythm and rate control and anticoagulation.  We will continue with the meds as listed. He does not want to switch from warfarin.    [name] has a CHA2DS2 - VASc score of 3.  Pulmonary HTN - PA pressure was moderately to severely increased at the last echo in Jan.  No change in therapy is planned.

## 2016-07-24 ENCOUNTER — Ambulatory Visit (INDEPENDENT_AMBULATORY_CARE_PROVIDER_SITE_OTHER): Payer: Medicare Other | Admitting: Cardiology

## 2016-07-24 ENCOUNTER — Encounter: Payer: Self-pay | Admitting: Cardiology

## 2016-07-24 VITALS — BP 108/60 | HR 69 | Ht 66.0 in | Wt 144.0 lb

## 2016-07-24 DIAGNOSIS — I483 Typical atrial flutter: Secondary | ICD-10-CM

## 2016-07-24 DIAGNOSIS — I428 Other cardiomyopathies: Secondary | ICD-10-CM | POA: Diagnosis not present

## 2016-07-24 MED ORDER — FUROSEMIDE 20 MG PO TABS: 20.0000 mg | ORAL_TABLET | Freq: Every day | ORAL | 6 refills | Status: DC

## 2016-07-24 NOTE — Patient Instructions (Signed)
Medication Instructions:  Continue current medications  Labwork: None Ordered  Testing/Procedures: None Ordered  Follow-Up: Your physician recommends that you schedule a follow-up appointment in: 2 Months.   Any Other Special Instructions Will Be Listed Below (If Applicable).   If you need a refill on your cardiac medications before your next appointment, please call your pharmacy.   

## 2016-08-07 HISTORY — PX: ESOPHAGOGASTRODUODENOSCOPY: SHX1529

## 2016-08-10 ENCOUNTER — Telehealth: Payer: Self-pay | Admitting: Pharmacist

## 2016-08-10 NOTE — Telephone Encounter (Signed)
Pt called to report he will start 5 day prednisone taper today. He refuses to come sooner than currently scheduled appt. Instructed him to take 1/2 tablet today then resume usual dose.

## 2016-08-20 DIAGNOSIS — R1310 Dysphagia, unspecified: Secondary | ICD-10-CM | POA: Diagnosis not present

## 2016-08-22 ENCOUNTER — Telehealth: Payer: Self-pay | Admitting: *Deleted

## 2016-08-22 ENCOUNTER — Other Ambulatory Visit: Payer: Self-pay

## 2016-08-22 MED ORDER — FUROSEMIDE 20 MG PO TABS: 20.0000 mg | ORAL_TABLET | Freq: Every day | ORAL | 2 refills | Status: DC

## 2016-08-22 NOTE — Telephone Encounter (Signed)
Pt states he was taken off his Coumadin by Dr. Tiffany Kocher at the [facility] for a esophageal procedure on 08/24/16 since he is having trouble swallowing.  He states he took his last dose on Sunday 08/19/16 in preparation for the procedure & that he does not have to have it checked prior to per Dr. Vira Agar. Pt denies taking any new medications & no other problems/issues.  He states he could not get in touch with anyone in the [facility] so he needed to come by this morning & notify us of this. Advised pt that he would have to checked next week after restarting Coumadin on 08/24/16 (pt states he will restart Friday nt) & he verbalized understanding.  Also, did not see a clearance form in the chart & pt is already holding Coumadin per Dr. Percell Boston instructions.

## 2016-08-23 ENCOUNTER — Encounter: Payer: Self-pay | Admitting: *Deleted

## 2016-08-24 ENCOUNTER — Other Ambulatory Visit: Payer: Self-pay

## 2016-08-24 ENCOUNTER — Encounter
Admission: RE | Disposition: A | Discharge status: Home / Self Care | Payer: Self-pay | Source: Ambulatory Visit | Attending: Unknown Physician Specialty

## 2016-08-24 ENCOUNTER — Ambulatory Visit: Payer: Medicare Other | Admitting: Anesthesiology

## 2016-08-24 ENCOUNTER — Ambulatory Visit
Admission: RE | Admit: 2016-08-24 | Discharge: 2016-08-24 | Disposition: A | Discharge status: Home / Self Care | Payer: Medicare Other | Source: Ambulatory Visit | Attending: Unknown Physician Specialty | Admitting: Unknown Physician Specialty

## 2016-08-24 ENCOUNTER — Encounter: Payer: Self-pay | Admitting: *Deleted

## 2016-08-24 DIAGNOSIS — I4892 Unspecified atrial flutter: Secondary | ICD-10-CM | POA: Insufficient documentation

## 2016-08-24 DIAGNOSIS — N400 Enlarged prostate without lower urinary tract symptoms: Secondary | ICD-10-CM | POA: Diagnosis not present

## 2016-08-24 DIAGNOSIS — Z79899 Other long term (current) drug therapy: Secondary | ICD-10-CM | POA: Diagnosis not present

## 2016-08-24 DIAGNOSIS — K222 Esophageal obstruction: Secondary | ICD-10-CM | POA: Insufficient documentation

## 2016-08-24 DIAGNOSIS — D649 Anemia, unspecified: Secondary | ICD-10-CM | POA: Diagnosis not present

## 2016-08-24 DIAGNOSIS — F419 Anxiety disorder, unspecified: Secondary | ICD-10-CM | POA: Insufficient documentation

## 2016-08-24 DIAGNOSIS — J449 Chronic obstructive pulmonary disease, unspecified: Secondary | ICD-10-CM | POA: Insufficient documentation

## 2016-08-24 DIAGNOSIS — Z95810 Presence of automatic (implantable) cardiac defibrillator: Secondary | ICD-10-CM | POA: Diagnosis not present

## 2016-08-24 DIAGNOSIS — K3189 Other diseases of stomach and duodenum: Secondary | ICD-10-CM | POA: Diagnosis not present

## 2016-08-24 DIAGNOSIS — I509 Heart failure, unspecified: Secondary | ICD-10-CM | POA: Diagnosis not present

## 2016-08-24 DIAGNOSIS — K589 Irritable bowel syndrome without diarrhea: Secondary | ICD-10-CM | POA: Insufficient documentation

## 2016-08-24 DIAGNOSIS — I739 Peripheral vascular disease, unspecified: Secondary | ICD-10-CM | POA: Diagnosis not present

## 2016-08-24 DIAGNOSIS — K219 Gastro-esophageal reflux disease without esophagitis: Secondary | ICD-10-CM | POA: Diagnosis not present

## 2016-08-24 HISTORY — PX: ESOPHAGOGASTRODUODENOSCOPY (EGD) WITH PROPOFOL: SHX5813

## 2016-08-24 SURGERY — ESOPHAGOGASTRODUODENOSCOPY (EGD) WITH PROPOFOL
Anesthesia: General

## 2016-08-24 MED ORDER — PROPOFOL 500 MG/50ML IV EMUL
INTRAVENOUS | Status: AC
  Filled 2016-08-24: qty 50

## 2016-08-24 MED ORDER — SODIUM CHLORIDE 0.9 % IV SOLN
INTRAVENOUS | Status: DC
  Administered 2016-08-24: 1000 mL via INTRAVENOUS

## 2016-08-24 MED ORDER — ONDANSETRON HCL 4 MG/2ML IJ SOLN: 4.0000 mg | Freq: Once | INTRAMUSCULAR | Status: DC | PRN

## 2016-08-24 MED ORDER — SODIUM CHLORIDE 0.9 % IV SOLN
Freq: Once | INTRAVENOUS | Status: AC
  Administered 2016-08-24: 10:00:00 via INTRAVENOUS
  Filled 2016-08-24: qty 100

## 2016-08-24 MED ORDER — VANCOMYCIN HCL IN DEXTROSE 1-5 GM/200ML-% IV SOLN
1000.0000 mg | Freq: Once | INTRAVENOUS | Status: AC
  Administered 2016-08-24: 1000 mg via INTRAVENOUS

## 2016-08-24 MED ORDER — FENTANYL CITRATE (PF) 100 MCG/2ML IJ SOLN: 25.0000 ug | INTRAMUSCULAR | Status: DC | PRN

## 2016-08-24 MED ORDER — SODIUM CHLORIDE 0.9 % IV SOLN: INTRAVENOUS | Status: DC

## 2016-08-24 MED ORDER — GENTAMICIN IN SALINE 1-0.9 MG/ML-% IV SOLN
100.0000 mg | Freq: Once | INTRAVENOUS | Status: DC
  Filled 2016-08-24: qty 100

## 2016-08-24 MED ORDER — GENTAMICIN SULFATE 40 MG/ML IJ SOLN: 110.0000 mg | Freq: Once | INTRAVENOUS | Status: DC

## 2016-08-24 MED ORDER — PROPOFOL 10 MG/ML IV BOLUS
INTRAVENOUS | Status: DC | PRN
  Administered 2016-08-24 (×2): 20 mg via INTRAVENOUS
  Administered 2016-08-24: 50 mg via INTRAVENOUS
  Administered 2016-08-24: 20 mg via INTRAVENOUS

## 2016-08-24 NOTE — H&P (Signed)
Primary Care Physician:  Ria Bush, MD Primary Gastroenterologist:  Dr. Vira Agar  Pre-Procedure History & Physical: HPI:  [name] is a 83 y.o. male is here for an endoscopy.   Past Medical History:  Diagnosis Date  . AICD (automatic cardioverter/defibrillator) present    s/p gen change 04/2015 w/ MDT [name] Crt-D  DTBA1D1, Serial number [id number]  . Allergic rhinitis    [name]  . Anemia   . Anxiety   . Arthritis   . Atrial flutter ([place])    A.  Status post cardioversion; B.  Tikosyn therapy - failed, remains in aflutter  . Bilateral pneumonia 11/02/2013   treated with levaquin  . BPH (benign prostatic hyperplasia)   . 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 Lucky Cowboy)  . Chronic sinusitis   . Chronic systolic congestive heart failure ([place])   . COPD (chronic obstructive pulmonary disease) ([place])    spirometry 2015 - no obstruction, + mild restrictive lung disease  . Extrinsic asthma    [name]  . GERD (gastroesophageal reflux disease) 2010   h/o esophageal stricture with dilation, LA grade C reflux esophagitis by EGD 2010  . Goiter   . History of diverticulitis of colon   . History of GI bleed    Secondary to hemorrhoids  . IBS (irritable bowel syndrome)   . LBBB (left bundle branch block)   . Multiple pulmonary nodules 06/2013   RUL ([facility]. Adam Phenix at [facility]) - ?vasculitis as of last CT at [facility]  . NICM (nonischemic cardiomyopathy) [facility])    Cardiac catheterization March 2006 without coronary disease; echocardiogram August 2008: EF 35%, mild AI, left atrial enlargement, EF 20-25% 09/2014  . [facility])     Past Surgical History:  Procedure Laterality Date  . BACK SURGERY  1997   bulging disks  . BiV-AICD implant     [employer]tr[employer]  . CARDIAC CATHETERIZATION  06/22/04   Severe, nonischemic cardiomyopathy,EF 25-30%  . CARDIOVERSION  02/22/09   AFlutter-([facility])  . CHOLECYSTECTOMY    . COLONOSCOPY  10/99   Divertic,  splenic, hepatic fleure only  . COLONOSCOPY  10/21/08   aborted-divertics, int hemms (Dr. Vira Agar)  . CORONARY ANGIOPLASTY    . EP IMPLANTABLE DEVICE N/A 04/25/2015   Procedure: BIV ICD Generator Changeout;  Surgeon: Evans Lance, MD;  Location: [place] [facility];  Service: Cardiovascular;  Laterality: N/A;  . ESOPHAGEAL DILATION  02/18/98  . ESOPHAGOGASTRODUODENOSCOPY  01/1998   stricture, sliding HH, GERD  . ESOPHAGOGASTRODUODENOSCOPY  04/08/01   stricture, gastritis, HH, GERD-no dilation(Dr. Henrene Pastor)  . ESOPHAGOGASTRODUODENOSCOPY  12/22/04   stricture; gastritis; duodenitis, GERD  . ESOPHAGOGASTRODUODENOSCOPY  10/21/08   Reflux esophagitis; Erythem. Duod. (Dr. Vira Agar)  . ESOPHAGOGASTRODUODENOSCOPY  08/2013   erythema gastric fundus - minimal gastritis, HH ([place])  . goiter removal    . HERNIA REPAIR    . HERNIA REPAIR  01/30/02   Dr. Hassell Done  . INSERT / REPLACE / REMOVE PACEMAKER    . NOSE SURGERY  1971  . PENILE PROSTHESIS IMPLANT  2007   Otelin  . PFT  10/2013   FVC 61%, FEV1 63%, ratio 0.76  . Pulmonary eval  04/2002   ([facility]) Chronic congestive symptoms  . REVERSE SHOULDER ARTHROPLASTY Right 01/17/2016   Procedure: REVERSE SHOULDER ARTHROPLASTY;  Surgeon: Corky Mull, MD;  Location: [facility] ORS;  Service: Orthopedics;  Laterality: Right;  . REVERSE TOTAL SHOULDER ARTHROPLASTY Right 01/2016  Poggi  . US ECHOCARDIOGRAPHY  07/13/04   EF 25-30%, Mod LVH; LA severe dilation; Mild AR; IRTR  . US ECHOCARDIOGRAPHY  11/27/06   hypokinesis posterior wall, EF 35%; mild AR    Prior to Admission medications   Medication Sig Start Date End Date Taking? Authorizing Provider  acetaminophen (TYLENOL) 500 MG tablet Take 250 mg by mouth daily as needed. Pain   Yes [provider]  ALPRAZolam (XANAX) 0.25 MG tablet Take 1 tablet (0.25 mg total) by mouth daily as needed for anxiety. 07/12/16  Yes Ria Bush, MD  alum & mag hydroxide-simeth (MAALOX/MYLANTA) 200-200-20 MG/5ML suspension  Take 15 mLs by mouth daily as needed for indigestion or heartburn.   Yes [provider]  budesonide-formoterol (SYMBICORT) 160-4.5 MCG/ACT inhaler Inhale 2 puffs into the lungs at bedtime.   Yes [provider]  carvedilol (COREG) 6.25 MG tablet Take 1.5 tablets (9.375 mg total) by mouth 2 (two) times daily with a meal. 05/04/16  Yes Barrett, Rhonda G, PA-C  enalapril (VASOTEC) 5 MG tablet Take 1.5 tablets (7.5 mg total) by mouth 2 (two) times daily. 05/04/16  Yes Barrett, Evelene Croon, PA-C  fluticasone (FLONASE) 50 MCG/ACT nasal spray Place 1 spray into both nostrils daily.   Yes [provider]  furosemide (LASIX) 20 MG tablet Take 1 tablet (20 mg total) by mouth daily. 08/22/16  Yes [name], MD  guaiFENesin 200 MG tablet Take 400 mg by mouth every 4 (four) hours as needed for cough or to loosen phlegm.   Yes [provider]  levalbuterol Penne Lash HFA) 45 MCG/ACT inhaler Inhale 1-2 puffs into the lungs every 6 (six) hours as needed for wheezing or shortness of breath. 08/31/14  Yes Ria Bush, MD  Loratadine 10 MG CAPS Take 1 capsule by mouth daily as needed (allergies).    Yes [provider]  montelukast (SINGULAIR) 10 MG tablet Take 10 mg by mouth daily as needed (BREATHING).    Yes [provider]  polyethylene glycol (MIRALAX / GLYCOLAX) packet Take 17 g by mouth daily as needed.   Yes [provider]  vitamin B-12 (CYANOCOBALAMIN) 500 MCG tablet Take 500 mg by mouth daily.   Yes [provider]  warfarin (COUMADIN) 5 MG tablet TAKE AS DIRECTED  BY  ANTICOAGULATION  CLINIC 01/02/16  Yes [name], MD    Allergies as of 08/23/2016 - Review Complete 08/23/2016  Allergen Reaction Noted  . Carafate [sucralfate] Other (See Comments) 07/12/2016  . Clarithromycin Nausea Only 12/16/2006  . Famotidine Other (See Comments) 12/16/2006  . Levaquin [levofloxacin] Other (See Comments) 04/11/2015  . Omeprazole Diarrhea  12/16/2006  . Oxytetracycline Other (See Comments) 12/16/2006  . Penicillins Swelling 12/16/2006  . Proton pump inhibitors Other (See Comments) 10/21/2011  . Ranitidine Diarrhea 12/08/2008  . Doxycycline Rash 08/27/2014    Family History  Problem Relation Age of Onset  . Coronary artery disease Father   . Hypertension Father   . Heart failure Mother   . Dysphagia Sister   . Breast cancer Unknown   . Ovarian cancer Unknown   . Uterine cancer Unknown   . Other Sister        stomach problems  . Other Sister        stomach problems  . Other Sister        stomach problems  . Alcohol abuse Maternal Uncle     Social History   Social History  . Marital status: Married    Spouse  name: N/A  . Number of children: 3  . Years of education: N/A   Occupational History  . retired   .  Retired   Social History Main Topics  . Smoking status: Never Smoker  . Smokeless tobacco: Never Used  . Alcohol use No  . Drug use: No  . Sexual activity: No   Other Topics Concern  . Not on file   Social History Narrative   Lives with wife   3 children-8 grandchildren   Still works on a farm.   Retired from [employer]   Had [employer]   Good friend with Dr. Jefm Bryant   Activity: No regular exercise   Diet: good water, fruits/vegetables daily    Review of Systems: See HPI, otherwise negative ROS  Physical Exam: BP 121/60   Pulse 70   Temp (!) 96.4 F (35.8 C) (Tympanic)   Resp 20   Ht 5\' 6"  (1.676 m)   Wt 63.5 kg (140 lb)   SpO2 100%   BMI 22.60 kg/m  General:   Alert,  pleasant and cooperative in NAD Head:  Normocephalic and atraumatic. Neck:  Supple; no masses or thyromegaly. Lungs:  Clear throughout to auscultation.    Heart:  Regular rate and rhythm. Abdomen:  Soft, nontender and nondistended. Normal bowel sounds, without guarding, and without rebound.   Neurologic:  Alert and  oriented x4;  grossly normal neurologically.  Impression/Plan: Marthann Schiller is here for an endoscopy to be performed for dysphagia  Risks, benefits, limitations, and alternatives regarding  endoscopy have been reviewed with the patient.  Questions have been answered.  All parties agreeable.   Gaylyn Cheers, MD  08/24/2016, 10:34 AM

## 2016-08-24 NOTE — Anesthesia Postprocedure Evaluation (Signed)
Anesthesia Post Note  Patient: [name]  Procedure(s) Performed: Procedure(s) (LRB): ESOPHAGOGASTRODUODENOSCOPY (EGD) WITH PROPOFOL (N/A)  Patient location during evaluation: PACU Anesthesia Type: General Level of consciousness: awake and alert and oriented Pain management: pain level controlled Vital Signs Assessment: post-procedure vital signs reviewed and stable Respiratory status: spontaneous breathing Cardiovascular status: blood pressure returned to baseline Anesthetic complications: no     Last Vitals:  Vitals:   08/24/16 1115 08/24/16 1125  BP: 101/81 112/62  Pulse: 60 61  Resp: 19 18  Temp:      Last Pain:  Vitals:   08/24/16 1055  TempSrc: Tympanic                 Lasya Vetter

## 2016-08-24 NOTE — Anesthesia Preprocedure Evaluation (Signed)
Anesthesia Evaluation  Patient identified by MRN, date of birth, ID band Patient awake    Reviewed: Allergy & Precautions, NPO status , Patient's Chart, lab work & pertinent test results  Airway Mallampati: II  TM Distance: >3 FB     Dental  (+) Chipped, Caps   Pulmonary asthma , pneumonia, resolved, COPD,  COPD inhaler,    Pulmonary exam normal        Cardiovascular + Peripheral Vascular Disease and +CHF  Normal cardiovascular exam+ dysrhythmias Atrial Fibrillation + pacemaker + Cardiac Defibrillator      Neuro/Psych Anxiety  Neuromuscular disease    GI/Hepatic Neg liver ROS, GERD  Medicated and Controlled,  Endo/Other  negative endocrine ROS  Renal/GU negative Renal ROS     Musculoskeletal  (+) Arthritis , Osteoarthritis,    Abdominal Normal abdominal exam  (+)   Peds negative pediatric ROS (+)  Hematology  (+) anemia ,   Anesthesia Other Findings Past Medical History: No date: AICD (automatic cardioverter/defibrillator) pr* No date: Allergic rhinitis     Comment: Donneta Romberg No date: Anemia No date: Anxiety No date: Arthritis No date: Atrial flutter ([facility])     Comment: A.  Status post cardioversion; B.  Tikosyn               therapy - failed, remains in aflutter 11/02/2013: Bilateral pneumonia     Comment: treated with levaquin No date: BPH (benign prostatic hyperplasia) 10/2011: 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[facility])     Comment: 1.2 cm, rec f/u 6 mo (Dr. Lucky Cowboy) No date: Chronic sinusitis No date: Chronic systolic congestive heart failure ([place]) No date: COPD (chronic obstructive pulmonary disease) (*     Comment: spirometry 2015 - no obstruction, + mild               restrictive lung disease No date: Extrinsic asthma     Comment: [name] 2010: GERD (gastroesophageal reflux disease)     Comment: h/o esophageal stricture with dilation, LA               grade C reflux esophagitis by EGD 2010 No date: Goiter No date:  History of diverticulitis of colon No date: History of GI bleed     Comment: Secondary to hemorrhoids No date: IBS (irritable bowel syndrome) No date: LBBB (left bundle branch block) 06/2013: Multiple pulmonary nodules     Comment: RUL ([facility]. 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[place]) No date: Presence of permanent cardiac pacemaker  Reproductive/Obstetrics                             Anesthesia Physical  Anesthesia Plan  ASA: IV  Anesthesia Plan: General   Post-op Pain Management:    Induction: Intravenous  Airway Management Planned: Nasal Cannula  Additional Equipment:   Intra-op Plan:   Post-operative Plan:   Informed Consent: I have reviewed the patients History and  Physical, chart, labs and discussed the procedure including the risks, benefits and alternatives for the proposed anesthesia with the patient or authorized representative who has indicated his/her understanding and acceptance.   Dental advisory given  Plan Discussed with: CRNA and Surgeon  Anesthesia Plan Comments:         Anesthesia Quick Evaluation

## 2016-08-24 NOTE — Transfer of Care (Signed)
Immediate Anesthesia Transfer of Care Note  Patient: [name]  Procedure(s) Performed: Procedure(s): ESOPHAGOGASTRODUODENOSCOPY (EGD) WITH PROPOFOL (N/A)  Patient Location: PACU and Endoscopy Unit  Anesthesia Type:General  Level of Consciousness: sedated and responds to stimulation  Airway & Oxygen Therapy: Patient Spontanous Breathing and Patient connected to nasal cannula oxygen  Post-op Assessment: Report given to RN and Post -op Vital signs reviewed and stable  Post vital signs: Reviewed and stable  Last Vitals:  Vitals:   08/24/16 0820 08/24/16 1055  BP: 121/60 (!) 91/46  Pulse: 70 60  Resp: 20 17  Temp: (!) 35.8 C (!) 35.6 C    Last Pain:  Vitals:   08/24/16 1055  TempSrc: Tympanic         Complications: No apparent anesthesia complications

## 2016-08-24 NOTE — Telephone Encounter (Signed)
error 

## 2016-08-24 NOTE — Op Note (Signed)
[facility] Patient Name: [name] Procedure Date: 08/24/2016 10:37 AM MRN: [id number] Account #: [id number] Date of Birth: [birth year] Admit Type: Outpatient Age: 83 Room: [facility] ENDO ROOM 1 Gender: Male Note Status: Finalized Procedure:            Upper GI endoscopy Indications:          Dysphagia Providers:            Manya Silvas, MD Referring MD:         Ria Bush (Referring MD) Medicines:            Propofol per Anesthesia Complications:        No immediate complications. Procedure:            Pre-Anesthesia Assessment:                       - After reviewing the risks and benefits, the patient                        was deemed in satisfactory condition to undergo the                        procedure.                       After obtaining informed consent, the endoscope was                        passed under direct vision. Throughout the procedure,                        the patient's blood pressure, pulse, and oxygen                        saturations were monitored continuously. The Endoscope                        was introduced through the mouth, and advanced to the                        second part of duodenum. The upper GI endoscopy was                        accomplished without difficulty. The patient tolerated                        the procedure well. Findings:      A moderate Schatzki ring (acquired) was found at the gastroesophageal       junction. At the end of the procedure A guidewire was placed and the       scope was withdrawn. Dilation was performed with a Savary dilator with       mild resistance at 16 mm and 17 mm.      The stomach was normal.      A mild duodenal ring seen in second portion passed with scope. deformity       was found in the second portion of the duodenum.      The stomach was normal. Impression:           - Moderate Schatzki ring. Dilated.                       -  Normal  stomach.                       - Duodenal deformity.                       - Normal stomach.                       - No specimens collected. Recommendation:       - soft food for 3 days, eat slowly, chew well, take                        small bites, continue medications. Manya Silvas, MD 08/24/2016 10:56:12 AM This report has been signed electronically. Number of Addenda: 0 Note Initiated On: 08/24/2016 10:37 AM      [facility]

## 2016-08-24 NOTE — Anesthesia Post-op Follow-up Note (Cosign Needed)
Anesthesia QCDR form completed.        

## 2016-08-25 ENCOUNTER — Encounter: Payer: Self-pay | Admitting: Family Medicine

## 2016-08-29 ENCOUNTER — Ambulatory Visit (INDEPENDENT_AMBULATORY_CARE_PROVIDER_SITE_OTHER): Payer: Medicare Other

## 2016-08-29 DIAGNOSIS — I483 Typical atrial flutter: Secondary | ICD-10-CM

## 2016-08-29 LAB — POCT INR: INR: 1.2

## 2016-08-30 ENCOUNTER — Encounter: Payer: Self-pay | Admitting: Unknown Physician Specialty

## 2016-09-05 ENCOUNTER — Encounter: Payer: Self-pay | Admitting: Cardiology

## 2016-09-12 ENCOUNTER — Ambulatory Visit (INDEPENDENT_AMBULATORY_CARE_PROVIDER_SITE_OTHER): Payer: Medicare Other | Admitting: *Deleted

## 2016-09-12 DIAGNOSIS — Z7901 Long term (current) use of anticoagulants: Secondary | ICD-10-CM | POA: Diagnosis not present

## 2016-09-12 DIAGNOSIS — I4892 Unspecified atrial flutter: Secondary | ICD-10-CM

## 2016-09-12 LAB — POCT INR: INR: 1.9

## 2016-09-14 ENCOUNTER — Encounter: Payer: Self-pay | Admitting: Internal Medicine

## 2016-09-14 ENCOUNTER — Ambulatory Visit (INDEPENDENT_AMBULATORY_CARE_PROVIDER_SITE_OTHER): Payer: Medicare Other | Admitting: Internal Medicine

## 2016-09-14 VITALS — BP 100/60 | HR 69 | Ht 66.0 in | Wt 142.2 lb

## 2016-09-14 DIAGNOSIS — Z95810 Presence of automatic (implantable) cardiac defibrillator: Secondary | ICD-10-CM | POA: Diagnosis not present

## 2016-09-14 DIAGNOSIS — I5022 Chronic systolic (congestive) heart failure: Secondary | ICD-10-CM

## 2016-09-14 DIAGNOSIS — I428 Other cardiomyopathies: Secondary | ICD-10-CM

## 2016-09-14 LAB — CUP PACEART INCLINIC DEVICE CHECK
Battery Remaining Longevity: 80 mo
Battery Voltage: 2.97 V
Brady Statistic AP VP Percent: 0 %
Brady Statistic AP VS Percent: 0 %
Brady Statistic AS VP Percent: 96.57 %
Brady Statistic AS VS Percent: 3.43 %
Brady Statistic RA Percent Paced: 0 %
Brady Statistic RV Percent Paced: 97.32 %
Date Time Interrogation Session: 20180608135416
HighPow Impedance: 39 Ohm
HighPow Impedance: 51 Ohm
Implantable Lead Implant Date: 20070525
Implantable Lead Implant Date: 20070525
Implantable Lead Implant Date: 20070525
Implantable Lead Location: 753858
Implantable Lead Location: 753859
Implantable Lead Location: 753860
Implantable Lead Model: 4194
Implantable Lead Model: 5076
Implantable Lead Model: 6949
Implantable Pulse Generator Implant Date: 20170116
Lead Channel Impedance Value: 190 Ohm
Lead Channel Impedance Value: 285 Ohm
Lead Channel Impedance Value: 342 Ohm
Lead Channel Impedance Value: 361 Ohm
Lead Channel Impedance Value: 456 Ohm
Lead Channel Impedance Value: 456 Ohm
Lead Channel Pacing Threshold Amplitude: 0.75 V
Lead Channel Pacing Threshold Amplitude: 1 V
Lead Channel Pacing Threshold Pulse Width: 0.4 ms
Lead Channel Pacing Threshold Pulse Width: 0.4 ms
Lead Channel Setting Pacing Amplitude: 2 V
Lead Channel Setting Pacing Amplitude: 2.5 V
Lead Channel Setting Pacing Pulse Width: 0.4 ms
Lead Channel Setting Pacing Pulse Width: 0.4 ms
Lead Channel Setting Sensing Sensitivity: 0.45 mV

## 2016-09-14 NOTE — Patient Instructions (Addendum)
Medication Instructions:  Your physician recommends that you continue on your current medications as directed. Please refer to the Current Medication list given to you today.   Labwork: None Ordered   Testing/Procedures: None Ordered   Follow-Up: Your physician wants you to follow-up in: July 2019 with Dr. Lovena Le. You will receive a reminder letter in the mail two months in advance. If you don't receive a letter, please call our office to schedule the follow-up appointment.  Remote monitoring is used to monitor your ICD from home. This monitoring reduces the number of office visits required to check your device to one time per year. It allows [place] to keep an eye on the functioning of your device to ensure it is working properly. You are scheduled for a device check from home on  12/17/16 . You may send your transmission at any time that day. If you have a wireless device, the transmission will be sent automatically. After your physician reviews your transmission, you will receive a postcard with your next transmission date.    Any Other Special Instructions Will Be Listed Below (If Applicable).     If you need a refill on your cardiac medications before your next appointment, please call your pharmacy.

## 2016-09-14 NOTE — Progress Notes (Signed)
HPI .[name] returns today for followup. He is a very pleasant 83 year old man with a nonischemic cardiomyopathy, complete heart block, chronic systolic heart failure, chronic atrial fibrillation, status post biventricular ICD implantation secondary to all the above in the setting of left bundle branch block. 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   Current Outpatient Prescriptions  Medication Sig Dispense Refill  . acetaminophen (TYLENOL) 500 MG tablet Take 250 mg by mouth daily as needed. Pain    . ALPRAZolam (XANAX) 0.25 MG tablet Take 1 tablet (0.25 mg total) by mouth daily as needed for anxiety. 30 tablet 0  . alum & mag hydroxide-simeth (MAALOX/MYLANTA) 200-200-20 MG/5ML suspension Take 15 mLs by mouth daily as needed for indigestion or heartburn.    . budesonide-formoterol (SYMBICORT) 160-4.5 MCG/ACT inhaler Inhale 2 puffs into the lungs at bedtime.    . carvedilol (COREG) 6.25 MG tablet Take 1.5 tablets (9.375 mg total) by mouth 2 (two) times daily with a meal. 270 tablet 3  . enalapril (VASOTEC) 5 MG tablet Take 1.5 tablets (7.5 mg total) by mouth 2 (two) times daily. 270 tablet 3  . fluticasone (FLONASE) 50 MCG/ACT nasal spray Place 1 spray into both nostrils daily.    . furosemide (LASIX) 20 MG tablet Take 1 tablet (20 mg total) by mouth daily. 90 tablet 2  . guaiFENesin 200 MG tablet Take 400 mg by mouth every 4 (four) hours as needed for cough or to loosen phlegm.    . levalbuterol (XOPENEX HFA) 45 MCG/ACT inhaler Inhale 1-2 puffs into the lungs every 6 (six) hours as needed for wheezing or shortness of breath. 1 Inhaler 1  . Loratadine 10 MG CAPS Take 1 capsule by mouth daily as needed (allergies).     . montelukast (SINGULAIR) 10 MG tablet Take 10 mg by mouth daily as needed (BREATHING).     . polyethylene glycol  (MIRALAX / GLYCOLAX) packet Take 17 g by mouth daily as needed (constipation).     . vitamin B-12 (CYANOCOBALAMIN) 500 MCG tablet Take 500 mg by mouth daily.    Marland Kitchen warfarin (COUMADIN) 5 MG tablet TAKE AS DIRECTED  BY  ANTICOAGULATION  CLINIC 135 tablet 3   No current facility-administered medications for this visit.      Past Medical History:  Diagnosis Date  . AICD (automatic cardioverter/defibrillator) present    s/p gen change 04/2015 w/ MDT [name] Crt-D  DTBA1D1, Serial number [id number]  . Allergic rhinitis    [name]  . Anemia   . Anxiety   . Arthritis   . Atrial flutter ([place])    A.  Status post cardioversion; B.  Tikosyn therapy - failed, remains in aflutter  . Bilateral pneumonia 11/02/2013   treated with levaquin  . BPH (benign prostatic hyperplasia)   . 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 Lucky Cowboy)  . Chronic sinusitis   . Chronic systolic congestive heart failure ([place])   . COPD (chronic obstructive pulmonary disease) ([place])    spirometry 2015 - no obstruction, + mild restrictive lung disease  . Extrinsic asthma    [name]  . GERD (gastroesophageal reflux disease) 2010   h/o esophageal stricture with dilation, LA grade C reflux esophagitis by EGD 2010  . Goiter   . History of diverticulitis  of colon   . History of GI bleed    Secondary to hemorrhoids  . IBS (irritable bowel syndrome)   . LBBB (left bundle branch block)   . Multiple pulmonary nodules 06/2013   RUL ([facility] Adam Phenix [facility]) - ?vasculitis as of last CT at [facility]  . NICM (nonischemic cardiomyopathy) [facility])    Cardiac catheterization March 2006 without coronary disease; echocardiogram August 2008: EF 35%, mild AI, left atrial enlargement, EF 20-25% 09/2014  . 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   Past Surgical History:  Procedure Laterality Date  . BACK SURGERY  1997   bulging disks  . BiV-AICD implant      Medtronic  . CARDIAC CATHETERIZATION  06/22/04   Severe, nonischemic cardiomyopathy,EF 25-30%  . CARDIOVERSION  02/22/09   AFlutter-([facility])  . CHOLECYSTECTOMY    . COLONOSCOPY  10/99   Divertic, splenic, hepatic fleure only  . COLONOSCOPY  10/21/08   aborted-divertics, int hemms (Dr. Vira Agar)  . CORONARY ANGIOPLASTY    . EP IMPLANTABLE DEVICE N/A 04/25/2015   Procedure: BIV ICD Generator Changeout;  Surgeon: Evans Lance, MD;  Location: [facility];  Service: Cardiovascular;  Laterality: N/A;  . ESOPHAGEAL DILATION  02/18/98  . ESOPHAGOGASTRODUODENOSCOPY  01/1998   stricture, sliding HH, GERD  . ESOPHAGOGASTRODUODENOSCOPY  04/08/01   stricture, gastritis, HH, GERD-no dilation(Dr. Henrene Pastor)  . ESOPHAGOGASTRODUODENOSCOPY  12/22/04   stricture; gastritis; duodenitis, GERD  . ESOPHAGOGASTRODUODENOSCOPY  10/21/08   Reflux esophagitis; Erythem. Duod. (Dr. Vira Agar)  . ESOPHAGOGASTRODUODENOSCOPY  08/2013   erythema gastric fundus - minimal gastritis, HH ([place])  . ESOPHAGOGASTRODUODENOSCOPY  08/2016   dysphagia - mod schatzki rin, dilated Tiffany Kocher)  . ESOPHAGOGASTRODUODENOSCOPY (EGD) WITH PROPOFOL N/A 08/24/2016   mod schatzki ring dilated, duodenal deformity Vira Agar, Gavin Pound, MD)  . goiter removal    . HERNIA REPAIR    . HERNIA REPAIR  01/30/02   Dr. Hassell Done  . INSERT / REPLACE / REMOVE PACEMAKER    . NOSE SURGERY  1971  . PENILE PROSTHESIS IMPLANT  2007   [place]  . PFT  10/2013   FVC 61%, FEV1 63%, ratio 0.76  . Pulmonary eval  04/2002   ([facility]) Chronic congestive symptoms  . REVERSE SHOULDER ARTHROPLASTY Right 01/17/2016   Procedure: REVERSE SHOULDER ARTHROPLASTY;  Surgeon: Corky Mull, MD;  Location: [facility] ORS;  Service: Orthopedics;  Laterality: Right;  . REVERSE TOTAL SHOULDER ARTHROPLASTY Right 01/2016   [place]  . US ECHOCARDIOGRAPHY  07/13/04   EF 25-30%, Mod LVH; LA severe dilation; Mild AR; IRTR  . US ECHOCARDIOGRAPHY  11/27/06   hypokinesis posterior wall, EF 35%; mild AR      Family History  Problem Relation Age of Onset  . Coronary artery disease Father   . Hypertension Father   . Heart failure Mother   . Dysphagia Sister   . Breast cancer Unknown   . Ovarian cancer Unknown   . Uterine cancer Unknown   . Other Sister        stomach problems  . Other Sister        stomach problems  . Other Sister        stomach problems  . Alcohol abuse Maternal Uncle      Social History   Social History  . Marital status: Married    Spouse name: N/A  . Number of children: 3  . Years  of education: N/A   Occupational History  . retired   .  Retired   Social History Main Topics  . Smoking status: Never Smoker  . Smokeless tobacco: Never Used  . Alcohol use No  . Drug use: No  . Sexual activity: No   Other Topics Concern  . Not on file   Social History Narrative   Lives with wife   3 children-8 grandchildren   Still works on a farm.   Retired from [employer]   Had [employer]   Good friend with Dr. Jefm Bryant   Activity: No regular exercise   Diet: good water, fruits/vegetables daily     BP 100/60   Pulse 69   Ht 5\' 6"  (1.676 m)   Wt 142 lb 3.2 oz (64.5 kg)   SpO2 95%   BMI 22.95 kg/m   Physical Exam:  Well appearing 83 year old man, NAD HEENT: Unremarkable Neck:  6 cm JVD, no thyromegally Lungs:  Clear with no wheezes, rales, or rhonchi. Well-healed ICD incision. HEART:  Regular rate rhythm, no murmurs, no rubs, no clicks Abd:  soft, positive bowel sounds, no organomegally, no rebound, no guarding Ext:  2 plus pulses, no edema, no cyanosis, no clubbing Skin:  No rashes no nodules Neuro:  CN II through XII intact, motor grossly intact  ECG - atypical atrial flutter with Biv pacing  DEVICE  Normal device function.  See PaceArt for details.   Assess/Plan:  1. Chronic systolic heart failure - his symptoms are well controlled. He will continue his current meds. I suspect he has a little worsening of his CHF and we  discussed this. No obvious medication change at this point. 2. Atrial flutter - this is left atrial and his rate is well controlled. He is in CHB today. 3. ICD - his Medtronic BiV ICD is working normally. Will follow. 4. AV block - he has previously had LBBB but today has CHB with a slow escape. Asymptomatic.  Mikle Bosworth.D.

## 2016-09-22 ENCOUNTER — Emergency Department: Payer: Medicare Other

## 2016-09-22 ENCOUNTER — Emergency Department
Admission: EM | Admit: 2016-09-22 | Discharge: 2016-09-22 | Disposition: A | Discharge status: Home / Self Care | Payer: Medicare Other | Attending: Emergency Medicine | Admitting: Emergency Medicine

## 2016-09-22 ENCOUNTER — Encounter: Payer: Self-pay | Admitting: Emergency Medicine

## 2016-09-22 DIAGNOSIS — Y93H2 Activity, gardening and landscaping: Secondary | ICD-10-CM | POA: Diagnosis not present

## 2016-09-22 DIAGNOSIS — Z79899 Other long term (current) drug therapy: Secondary | ICD-10-CM | POA: Insufficient documentation

## 2016-09-22 DIAGNOSIS — Z7951 Long term (current) use of inhaled steroids: Secondary | ICD-10-CM | POA: Insufficient documentation

## 2016-09-22 DIAGNOSIS — S40212A Abrasion of left shoulder, initial encounter: Secondary | ICD-10-CM | POA: Insufficient documentation

## 2016-09-22 DIAGNOSIS — Z95810 Presence of automatic (implantable) cardiac defibrillator: Secondary | ICD-10-CM | POA: Insufficient documentation

## 2016-09-22 DIAGNOSIS — W231XXA Caught, crushed, jammed, or pinched between stationary objects, initial encounter: Secondary | ICD-10-CM | POA: Insufficient documentation

## 2016-09-22 DIAGNOSIS — J449 Chronic obstructive pulmonary disease, unspecified: Secondary | ICD-10-CM | POA: Insufficient documentation

## 2016-09-22 DIAGNOSIS — S0081XA Abrasion of other part of head, initial encounter: Secondary | ICD-10-CM | POA: Diagnosis not present

## 2016-09-22 DIAGNOSIS — S72112A Displaced fracture of greater trochanter of left femur, initial encounter for closed fracture: Secondary | ICD-10-CM

## 2016-09-22 DIAGNOSIS — Z7901 Long term (current) use of anticoagulants: Secondary | ICD-10-CM | POA: Insufficient documentation

## 2016-09-22 DIAGNOSIS — I5022 Chronic systolic (congestive) heart failure: Secondary | ICD-10-CM | POA: Insufficient documentation

## 2016-09-22 DIAGNOSIS — Y92017 Garden or yard in single-family (private) house as the place of occurrence of the external cause: Secondary | ICD-10-CM | POA: Insufficient documentation

## 2016-09-22 DIAGNOSIS — Y998 Other external cause status: Secondary | ICD-10-CM | POA: Diagnosis not present

## 2016-09-22 DIAGNOSIS — S79912A Unspecified injury of left hip, initial encounter: Secondary | ICD-10-CM | POA: Diagnosis present

## 2016-09-22 DIAGNOSIS — W19XXXA Unspecified fall, initial encounter: Secondary | ICD-10-CM

## 2016-09-22 MED ORDER — FENTANYL CITRATE (PF) 100 MCG/2ML IJ SOLN
25.0000 ug | Freq: Once | INTRAMUSCULAR | Status: AC
  Administered 2016-09-22: 25 ug via INTRAVENOUS
  Filled 2016-09-22: qty 2

## 2016-09-22 MED ORDER — ONDANSETRON 4 MG PO TBDP: 4.0000 mg | ORAL_TABLET | Freq: Three times a day (TID) | ORAL | 0 refills | Status: DC | PRN

## 2016-09-22 MED ORDER — OXYCODONE HCL 5 MG PO TABS: 5.0000 mg | ORAL_TABLET | Freq: Three times a day (TID) | ORAL | 0 refills | Status: DC | PRN

## 2016-09-22 MED ORDER — TRAMADOL HCL 50 MG PO TABS
50.0000 mg | ORAL_TABLET | Freq: Once | ORAL | Status: DC
  Filled 2016-09-22: qty 1

## 2016-09-22 MED ORDER — FENTANYL CITRATE (PF) 100 MCG/2ML IJ SOLN
25.0000 ug | Freq: Once | INTRAMUSCULAR | Status: AC
  Administered 2016-09-22: 25 ug via INTRAVENOUS

## 2016-09-22 MED ORDER — FENTANYL CITRATE (PF) 100 MCG/2ML IJ SOLN
INTRAMUSCULAR | Status: AC
  Filled 2016-09-22: qty 2

## 2016-09-22 NOTE — ED Notes (Signed)
Patient fitted with walker and able to ambulate to restroom with minimal assistance. Son with patient for help as needed.

## 2016-09-22 NOTE — ED Notes (Signed)
Patient with pain to left hip after mechanical fall this morning. Denies other pain. Denies syncope or dizziness.

## 2016-09-22 NOTE — Discharge Instructions (Signed)
Take the zofran about 15 minutes before you take your pain medication.  Call Dr. Nicholaus Bloom office on Monday to schedule an appointment.  Very limited weight bearing on your left leg. Use your walker at all times.  Return to the ER for symptoms that change or worsen over the weekend.

## 2016-09-22 NOTE — ED Triage Notes (Signed)
Patient to ER for c/o left hip pain after fall at approx 0600 this am. States he went out to pick tomatoes from his garden, got "tangled up in fencing" and fell. Denies dizziness, syncope, or LOC.

## 2016-09-22 NOTE — ED Provider Notes (Signed)
[facility] Emergency Department Provider Note ____________________________________________  Time seen: Approximately 9:23 AM  I have reviewed the triage vital signs and the nursing notes.   HISTORY  Chief Complaint Hip Pain and Fall  HPI [name] is a 83 y.o. male who presents to the emergency department for evaluation of left hip pain after a mechanical, nonsyncopal fall earlier this morning. He was out picking tomatoes and got his foot tangled in the wire of an electric fence which caused him to fall. No loss of consciousness. He also struck his left shoulder. He has not taken anything for pain since the incident.  Past Medical History:  Diagnosis Date  . AICD (automatic cardioverter/defibrillator) present    s/p gen change 04/2015 w/ MDT [name] Crt-D  DTBA1D1, Serial number [id number]  . Allergic rhinitis    [name]  . Anemia   . Anxiety   . Arthritis   . Atrial flutter ([place])    A.  Status post cardioversion; B.  Tikosyn therapy - failed, remains in aflutter  . Bilateral pneumonia 11/02/2013   treated with levaquin  . BPH (benign prostatic hyperplasia)   . 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 Lucky Cowboy)  . Chronic sinusitis   . Chronic systolic congestive heart failure ([place])   . COPD (chronic obstructive pulmonary disease) ([place])    spirometry 2015 - no obstruction, + mild restrictive lung disease  . Extrinsic asthma    [name]  . GERD (gastroesophageal reflux disease) 2010   h/o esophageal stricture with dilation, LA grade C reflux esophagitis by EGD 2010  . Goiter   . History of diverticulitis of colon   . History of GI bleed    Secondary to hemorrhoids  . IBS (irritable bowel syndrome)   . LBBB (left bundle branch block)   . Multiple pulmonary nodules 06/2013   RUL ([facility] Adam Phenix [facility]) - ?vasculitis as of last CT at [facility]  . NICM (nonischemic cardiomyopathy) [facility])    Cardiac catheterization  March 2006 without coronary disease; echocardiogram August 2008: EF 35%, mild AI, left atrial enlargement, EF 20-25% 09/2014  . [facility])     Patient Active Problem List   Diagnosis Date Noted  . Skin tear of forearm without complication, right, initial encounter 07/12/2016  . Status post reverse total arthroplasty of right shoulder 01/17/2016  . Complete tear of right rotator cuff 12/10/2015  . Acute sinusitis 03/18/2015  . Complete tear of left rotator cuff 05/07/2014  . Warfarin anticoagulation 05/07/2014  . Advanced care planning/counseling discussion 03/16/2014  . Constipation 09/09/2013  . Hyperglycemia 09/09/2013  . Medicare annual wellness visit, subsequent 04/22/2012  . 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[place]) 10/08/2011  . Left sided sciatica 06/11/2011  . Thrombocytopenia ([place]) 02/25/2011  . B12 deficiency 02/22/2011  . Sinus congestion 09/25/2010  . Long term current use of anticoagulant 06/27/2010  . ESOPHAGEAL STRICTURE 02/16/2010  . CHEST PAIN 01/03/2010  . Atrial flutter ([place]) 12/20/2008  . Anxiety 12/08/2008  . Secondary cardiomyopathy ([place]) 09/13/2008  . LBBB 09/13/2008  . Automatic implantable cardioverter-defibrillator in situ 09/13/2008  . PORPHYRIA 12/16/2006  . Chronic systolic heart failure ([place]) 12/16/2006  . ALLERGIC RHINITIS 12/16/2006  . GERD 12/16/2006  . BENIGN PROSTATIC HYPERTROPHY 12/16/2006  . IBS 12/08/1997    Past Surgical History:  Procedure Laterality Date  . BACK SURGERY  1997   bulging disks  . BiV-AICD implant     [employer]  .  CARDIAC CATHETERIZATION  06/22/04   Severe, nonischemic cardiomyopathy,EF 25-30%  . CARDIOVERSION  02/22/09   AFlutter-([facility])  . CHOLECYSTECTOMY    . COLONOSCOPY  10/99   Divertic, splenic, hepatic fleure only  . COLONOSCOPY  10/21/08   aborted-divertics, int hemms (Dr. Vira Agar)  . CORONARY ANGIOPLASTY    . EP IMPLANTABLE DEVICE N/A 04/25/2015   Procedure: BIV ICD Generator Changeout;  Surgeon: Evans Lance,  MD;  Location: [place] CV LAB;  Service: Cardiovascular;  Laterality: N/A;  . ESOPHAGEAL DILATION  02/18/98  . ESOPHAGOGASTRODUODENOSCOPY  01/1998   stricture, sliding HH, GERD  . ESOPHAGOGASTRODUODENOSCOPY  04/08/01   stricture, gastritis, HH, GERD-no dilation(Dr. Henrene Pastor)  . ESOPHAGOGASTRODUODENOSCOPY  12/22/04   stricture; gastritis; duodenitis, GERD  . ESOPHAGOGASTRODUODENOSCOPY  10/21/08   Reflux esophagitis; Erythem. Duod. (Dr. Vira Agar)  . ESOPHAGOGASTRODUODENOSCOPY  08/2013   erythema gastric fundus - minimal gastritis, HH ([place])  . ESOPHAGOGASTRODUODENOSCOPY  08/2016   dysphagia - mod schatzki rin, dilated Tiffany Kocher)  . ESOPHAGOGASTRODUODENOSCOPY (EGD) WITH PROPOFOL N/A 08/24/2016   mod schatzki ring dilated, duodenal deformity Vira Agar, Gavin Pound, MD)  . goiter removal    . HERNIA REPAIR    . HERNIA REPAIR  01/30/02   Dr. Hassell Done  . INSERT / REPLACE / REMOVE PACEMAKER    . NOSE SURGERY  1971  . PENILE PROSTHESIS IMPLANT  2007   [place]  . PFT  10/2013   FVC 61%, FEV1 63%, ratio 0.76  . Pulmonary eval  04/2002   ([facility]) Chronic congestive symptoms  . REVERSE SHOULDER ARTHROPLASTY Right 01/17/2016   Procedure: REVERSE SHOULDER ARTHROPLASTY;  Surgeon: Corky Mull, MD;  Location: [facility] ORS;  Service: Orthopedics;  Laterality: Right;  . REVERSE TOTAL SHOULDER ARTHROPLASTY Right 01/2016   Pog[place]  . US ECHOCARDIOGRAPHY  07/13/04   EF 25-30%, Mod LVH; LA severe dilation; Mild AR; IRTR  . US ECHOCARDIOGRAPHY  11/27/06   hypokinesis posterior wall, EF 35%; mild AR    Prior to Admission medications   Medication Sig Start Date End Date Taking? Authorizing Provider  acetaminophen (TYLENOL) 500 MG tablet Take 250 mg by mouth daily as needed. Pain    [provider]  ALPRAZolam (XANAX) 0.25 MG tablet Take 1 tablet (0.25 mg total) by mouth daily as needed for anxiety. 07/12/16   Ria Bush, MD  alum & mag hydroxide-simeth (MAALOX/MYLANTA) 200-200-20 MG/5ML suspension Take 15 mLs by  mouth daily as needed for indigestion or heartburn.    [provider]  budesonide-formoterol (SYMBICORT) 160-4.5 MCG/ACT inhaler Inhale 2 puffs into the lungs at bedtime.    [provider]  carvedilol (COREG) 6.25 MG tablet Take 1.5 tablets (9.375 mg total) by mouth 2 (two) times daily with a meal. 05/04/16   Barrett, Evelene Croon, PA-C  enalapril (VASOTEC) 5 MG tablet Take 1.5 tablets (7.5 mg total) by mouth 2 (two) times daily. 05/04/16   Barrett, Evelene Croon, PA-C  fluticasone (FLONASE) 50 MCG/ACT nasal spray Place 1 spray into both nostrils daily.    [provider]  furosemide (LASIX) 20 MG tablet Take 1 tablet (20 mg total) by mouth daily. 08/22/16   [name], MD  guaiFENesin 200 MG tablet Take 400 mg by mouth every 4 (four) hours as needed for cough or to loosen phlegm.    [provider]  levalbuterol Penne Lash HFA) 45 MCG/ACT inhaler Inhale 1-2 puffs into the lungs every 6 (six) hours as needed for wheezing or shortness of breath. 08/31/14   Ria Bush, MD  Loratadine 10 MG CAPS Take 1 capsule by mouth daily as needed (allergies).     [provider]  montelukast (SINGULAIR) 10 MG tablet Take 10 mg by mouth daily as needed (BREATHING).     [provider]  ondansetron (ZOFRAN-ODT) 4 MG disintegrating tablet Take 1 tablet (4 mg total) by mouth every 8 (eight) hours as needed for nausea or vomiting. 09/22/16   Sanyiah Kanzler B, FNP  oxyCODONE (ROXICODONE) 5 MG immediate release tablet Take 1 tablet (5 mg total) by mouth every 8 (eight) hours as needed. 09/22/16 09/22/17  Audi Wettstein, Johnette Abraham B, FNP  polyethylene glycol (MIRALAX / GLYCOLAX) packet Take 17 g by mouth daily as needed (constipation).     [provider]  vitamin B-12 (CYANOCOBALAMIN) 500 MCG tablet Take 500 mg by mouth daily.    [provider]  warfarin (COUMADIN) 5 MG tablet TAKE AS DIRECTED  BY  ANTICOAGULATION  CLINIC 01/02/16   [name], MD     Allergies Carafate [sucralfate]; Clarithromycin; Famotidine; Levaquin [levofloxacin]; Omeprazole; Oxytetracycline; Penicillins; Proton pump inhibitors; Ranitidine; and Doxycycline  Family History  Problem Relation Age of Onset  . Coronary artery disease Father   . Hypertension Father   . Heart failure Mother   . Dysphagia Sister   . Breast cancer Unknown   . Ovarian cancer Unknown   . Uterine cancer Unknown   . Other Sister        stomach problems  . Other Sister        stomach problems  . Other Sister        stomach problems  . Alcohol abuse Maternal Uncle     Social History Social History  Substance Use Topics  . Smoking status: Never Smoker  . Smokeless tobacco: Never Used  . Alcohol use No    Review of Systems Constitutional: Uncomfortable appearing. Cardiovascular: Negative for active bleeding. Respiratory: Negative for shortness of breath or cough. Musculoskeletal: Positive for left hip pain. Skin: Positive for abrasion to left eyebrow and left shoulder.  Neurological: Negative for loss of consciousness or change in mentation.  ____________________________________________   PHYSICAL EXAM:  VITAL SIGNS: ED Triage Vitals  Enc Vitals Group     BP 09/22/16 0815 110/79     Pulse Rate 09/22/16 0815 61     Resp 09/22/16 0815 20     Temp 09/22/16 0815 97.6 F (36.4 C)     Temp Source 09/22/16 0815 Oral     SpO2 09/22/16 0815 97 %     Weight 09/22/16 0816 141 lb (64 kg)     Height 09/22/16 0816 5\' 7"  (1.702 m)     Head Circumference --      Peak Flow --      Pain Score 09/22/16 0815 8     Pain Loc --      Pain Edu? --      Excl. in [place]? --     Constitutional: Alert and oriented. Well appearing and in no acute distress. Eyes: Conjunctivae are clear.  Head: Atraumatic Neck: Nexus criteria is negative Respiratory: Respirations are even and unlabored. Musculoskeletal: Very limited ROM of the left hip secondary to pain. Neurologic: Alert and oriented x  4.  Skin: Small abrasion over the left eyebrow and left shoulder  ___________________________________________   LABS (all labs ordered are listed, but only abnormal results are displayed)  Labs Reviewed - No data to display ____________________________________________  RADIOLOGY  IMPRESSION: Suspect left-sided trochanteric fracture, nondisplaced. Recommend further evaluation with  cross-sectional imaging. MRI of the left hip would be the study of choice. Per radiology. ____________________________________________   PROCEDURES  Procedure(s) performed: None  ____________________________________________   INITIAL IMPRESSION / ASSESSMENT AND PLAN / ED COURSE  [name] is a 83 y.o. male who presents to the emergency department for evaluation of left hip pain after a mechanical, nonsyncopal fall this morning. X-ray can not rule out intertrochanteric extension. MRI preferred for further evaluation, however patient has a pacemaker and internal defibrillator so CT without contrast ordered instead. He is in significant pain and is unable to tolerate PO pain medication including tylenol. IV inserted by the RN and Fentanyl 96mcg ordered. Will monitor and give additional medication if needed.  ----------------------------------------- 10:13 AM on 09/22/2016 -----------------------------------------  Second dose of fentanyl given prior to CT. Vital signs stable.   ----------------------------------------- 10:57 AM on 09/22/2016 -----------------------------------------  Case was discussed with Dr. Roland Rack who advises discharge home to follow up in the office next week. He also advises him to use a walker with toe-touch weightbearing only. Plan was discussed with the patient and family who will call to schedule a follow-up appointment. Pain management was discussed with the patient to reports significant GI complaints with any pain medication, specifically with hydrocodone. He will  be prescribed Zofran and oxycodone. He was advised to take the Zofran 15 minutes prior to the oxycodone. He was advised to discuss medication management with Dr. Roland Rack if unable to tolerate this combination. He was instructed to return to the emergency department for any symptom of concern or for increase in pain if unable to see primary care or the orthopedic specialist.   Pertinent labs & imaging results that were available during my care of the patient were reviewed by me and considered in my medical decision making (see chart for details).  _________________________________________   FINAL CLINICAL IMPRESSION(S) / ED DIAGNOSES  Final diagnoses:  Fall, initial encounter  Closed displaced fracture of greater trochanter of left femur, initial encounter (HCC)    New Prescriptions   ONDANSETRON (ZOFRAN-ODT) 4 MG DISINTEGRATING TABLET    Take 1 tablet (4 mg total) by mouth every 8 (eight) hours as needed for nausea or vomiting.   OXYCODONE (ROXICODONE) 5 MG IMMEDIATE RELEASE TABLET    Take 1 tablet (5 mg total) by mouth every 8 (eight) hours as needed.    If controlled substance prescribed during this visit, 12 month history viewed on the [place] prior to issuing an initial prescription for Schedule II or III opiod.    Victorino Dike, FNP 09/22/16 1124    Delman Kitten, MD 09/22/16 1644

## 2016-09-23 NOTE — Progress Notes (Deleted)
HPI [name] presents for follow up of his cardiomyopathy and atrial flutter. Since I last saw him he has had a GI procedure secondary to difficulty swallowing.  He also was in the ED with a mechanical fall.  He saw Dr. Lovena [name] and had normal device function.   ***    has seen [name] PAc a couple of times for increased volume and had to have diuretics increased.  He was volume overloaded when I saw him last but increased diuretic seemed to have helped this by the time of the last appt.    Since I last saw him he has [name] well.  The patient denies any new symptoms such as chest discomfort, neck or arm discomfort. There has been no new shortness of breath, PND or orthopnea. There have been no reported palpitations, presyncope or syncope.  He planted all of the tomatoes and then had to cover them yesterday.  400 plants!    Allergies  Allergen Reactions  . Clarithromycin Nausea Only  . Famotidine Other (See Comments)    ABD. PAIN  . Levaquin [Levofloxacin] Other (See Comments)    Stomach pain, has to eat a lot of food  . Omeprazole Diarrhea  . Oxytetracycline Other (See Comments)    BUMPS  . Penicillins Swelling      . Proton Pump Inhibitors Other (See Comments)    GI upset, diarrhea, gas, bloating  . Ranitidine Diarrhea  . Doxycycline Rash    Current Outpatient Prescriptions  Medication Sig Dispense Refill  . acetaminophen (TYLENOL) 500 MG tablet Take 250 mg by mouth daily as needed. Pain    . ALPRAZolam (XANAX) 0.25 MG tablet Take 1 tablet (0.25 mg total) by mouth daily as needed for anxiety. 30 tablet 0  . alum & mag hydroxide-simeth (MAALOX/MYLANTA) 200-200-20 MG/5ML suspension Take 15 mLs by mouth daily as needed for indigestion or heartburn.    . budesonide-formoterol (SYMBICORT) 160-4.5 MCG/ACT inhaler Inhale 2 puffs into the lungs at bedtime.    . carvedilol (COREG) 6.25 MG tablet Take 1.5 tablets (9.375 mg total) by mouth 2 (two) times daily with a  meal. 270 tablet 3  . enalapril (VASOTEC) 5 MG tablet Take 1.5 tablets (7.5 mg total) by mouth 2 (two) times daily. 270 tablet 3  . fluticasone (FLONASE) 50 MCG/ACT nasal spray Place 1 spray into both nostrils daily.    . furosemide (LASIX) 20 MG tablet Take 1 tablet (20 mg total) by mouth daily. 90 tablet 2  . guaiFENesin 200 MG tablet Take 400 mg by mouth every 4 (four) hours as needed for cough or to loosen phlegm.    . levalbuterol (XOPENEX HFA) 45 MCG/ACT inhaler Inhale 1-2 puffs into the lungs every 6 (six) hours as needed for wheezing or shortness of breath. 1 Inhaler 1  . Loratadine 10 MG CAPS Take 1 capsule by mouth daily as needed (allergies).     . montelukast (SINGULAIR) 10 MG tablet Take 10 mg by mouth daily as needed (BREATHING).     Marland Kitchen ondansetron (ZOFRAN-ODT) 4 MG disintegrating tablet Take 1 tablet (4 mg total) by mouth every 8 (eight) hours as needed for nausea or vomiting. 20 tablet 0  . oxyCODONE (ROXICODONE) 5 MG immediate release tablet Take 1 tablet (5 mg total) by mouth every 8 (eight) hours as needed. 20 tablet 0  . polyethylene glycol (MIRALAX / GLYCOLAX) packet Take 17 g by mouth daily as needed (constipation).     . vitamin B-12 (  CYANOCOBALAMIN) 500 MCG tablet Take 500 mg by mouth daily.    Marland Kitchen warfarin (COUMADIN) 5 MG tablet TAKE AS DIRECTED  BY  ANTICOAGULATION  CLINIC 135 tablet 3   No current facility-administered medications for this visit.     Past Medical History:  Diagnosis Date  . AICD (automatic cardioverter/defibrillator) present    s/p gen change 04/2015 w/ MDT [name] Crt-D  DTBA1D1, Serial number [id number]  . Allergic rhinitis    [name]  . Anemia   . Anxiety   . Arthritis   . Atrial flutter ([place])    A.  Status post cardioversion; B.  Tikosyn therapy - failed, remains in aflutter  . Bilateral pneumonia 11/02/2013   treated with levaquin  . BPH (benign prostatic hyperplasia)   . 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 Lucky [name])    . Chronic sinusitis   . Chronic systolic congestive heart failure ([place])   . COPD (chronic obstructive pulmonary disease) ([place])    spirometry 2015 - no obstruction, + mild restrictive lung disease  . Extrinsic asthma    [name]  . GERD (gastroesophageal reflux disease) 2010   h/o esophageal stricture with dilation, LA grade C reflux esophagitis by EGD 2010  . Goiter   . History of diverticulitis of colon   . History of GI bleed    Secondary to hemorrhoids  . IBS (irritable bowel syndrome)   . LBBB (left bundle branch block)   . Multiple pulmonary nodules 06/2013   RUL ([facility] Adam [name] [facility]) - ?vasculitis as of last CT at [facility]  . NICM (nonischemic cardiomyopathy) [facility])    Cardiac catheterization March 2006 without coronary disease; echocardiogram August 2008: EF 35%, mild AI, left atrial enlargement, EF 20-25% 09/2014  . [facility])     Past Surgical History:  Procedure Laterality Date  . BACK SURGERY  1997   bulging disks  . BiV-AICD implant     [employer]  . CARDIAC CATHETERIZATION  06/22/04   Severe, nonischemic cardiomyopathy,EF 25-30%  . CARDIOVERSION  02/22/09   AFlutter-([facility])  . CHOLECYSTECTOMY    . COLONOSCOPY  10/99   Divertic, splenic, hepatic fleure only  . COLONOSCOPY  10/21/08   aborted-divertics, int hemms (Dr. Vira [name])  . CORONARY ANGIOPLASTY    . EP IMPLANTABLE DEVICE N/A 04/25/2015   Procedure: BIV ICD Generator Changeout;  Surgeon: [name] Lance, MD;  Location: [place] CV LAB;  Service: Cardiovascular;  Laterality: N/A;  . ESOPHAGEAL DILATION  02/18/98  . ESOPHAGOGASTRODUODENOSCOPY  01/1998   stricture, sliding HH, GERD  . ESOPHAGOGASTRODUODENOSCOPY  04/08/01   stricture, gastritis, HH, GERD-no dilation(Dr. Henrene [name])  . ESOPHAGOGASTRODUODENOSCOPY  12/22/04   stricture; gastritis; duodenitis, GERD  . ESOPHAGOGASTRODUODENOSCOPY  10/21/08   Reflux esophagitis; Erythem. Duod. (Dr. Vira [name])  . ESOPHAGOGASTRODUODENOSCOPY  08/2013   erythema  gastric fundus - minimal gastritis, HH ([name])  . ESOPHAGOGASTRODUODENOSCOPY  08/2016   dysphagia - mod schatzki rin, dilated [name])  . ESOPHAGOGASTRODUODENOSCOPY (EGD) WITH PROPOFOL N/A 08/24/2016   mod schatzki ring dilated, duodenal deformity [name], [name] Pound, MD)  . goiter removal    . HERNIA REPAIR    . HERNIA REPAIR  01/30/02   Dr. Hassell [name]  . INSERT / REPLACE / REMOVE PACEMAKER    . NOSE SURGERY  1971  . PENILE PROSTHESIS IMPLANT  2007   [name]  . PFT  10/2013   FVC 61%, FEV1 63%, ratio 0.76  . Pulmonary eval  04/2002   (  [name]) Chronic congestive symptoms  . REVERSE SHOULDER ARTHROPLASTY Right 01/17/2016   Procedure: REVERSE SHOULDER ARTHROPLASTY;  Surgeon: [name] Mull, MD;  Location: [facility] ORS;  Service: Orthopedics;  Laterality: Right;  . REVERSE TOTAL SHOULDER ARTHROPLASTY Right 01/2016   [name]  . US ECHOCARDIOGRAPHY  07/13/04   EF 25-30%, Mod LVH; LA severe dilation; Mild AR; IRTR  . US ECHOCARDIOGRAPHY  11/27/06   hypokinesis posterior wall, EF 35%; mild AR    ROS:  ***  PHYSICAL EXAM There were no vitals taken for this visit.  GENERAL:  Well appearing NECK:  No jugular venous distention, waveform within normal limits, carotid upstroke brisk and symmetric, no bruits, no thyromegaly LUNGS:  Clear to auscultation bilaterally BACK:  No CVA tenderness CHEST:  Unremarkable HEART:  PMI not displaced or sustained,S1 and S2 within normal limits, no S3, no S4, no clicks, no rubs, *** murmurs ABD:  Flat, positive bowel sounds normal in frequency in pitch, no bruits, no rebound, no guarding, no midline pulsatile mass, no hepatomegaly, no splenomegaly EXT:  2 plus pulses throughout, no edema, no cyanosis no clubbing   GENERAL:  Well appearing NECK:  Positive jugular venous distention 6 cm with at 45 degrees with HJR., waveform within normal limits, carotid upstroke brisk and symmetric, no bruits, no thyromegaly LUNGS:  Diffuse fine crackles CHEST:  Well healed ICD pocket HEART:   PMI not displaced or sustained,S1 and S2 within normal limits, no S3, no clicks, no rubs, 2 out of 6 apical diastolic murmur short and nonradiating.  Unchanged from previous exam.   ABD:  Flat, positive bowel sounds normal in frequency in pitch, no bruits, no rebound, no guarding, no midline pulsatile mass, no hepatomegaly, no splenomegaly EXT:  2 plus pulses throughout, no edema, no cyanosis no clubbing  EKG:  Atrial fibrillation, rate *** , ventricular pacing.  09/23/2016  ASSESSMENT AND PLAN  Nonischemic cardiomyopathy -  *** I he seems to be euvolemic. He sometimes doesn't take oral corticosteroids been prescribed and I told he should do this. Otherwise no change in therapy is suggested.   Atrial flutter -  ***  The patient tolerates this rhythm and rate control and anticoagulation.  We will continue with the meds as listed. He does not want to switch from warfarin.    [name] has a CHA2DS2 - VASc score of 3.  Pulmonary HTN - ***  PA pressure was moderately to severely increased at the last echo in Jan.  No change in therapy is planned.

## 2016-09-24 ENCOUNTER — Ambulatory Visit: Payer: Medicare Other | Admitting: Cardiology

## 2016-09-28 DIAGNOSIS — S72002A Fracture of unspecified part of neck of left femur, initial encounter for closed fracture: Secondary | ICD-10-CM | POA: Diagnosis not present

## 2016-09-28 DIAGNOSIS — S72112A Displaced fracture of greater trochanter of left femur, initial encounter for closed fracture: Secondary | ICD-10-CM | POA: Diagnosis not present

## 2016-10-02 ENCOUNTER — Encounter: Payer: Self-pay | Admitting: Family Medicine

## 2016-10-02 DIAGNOSIS — S72002A Fracture of unspecified part of neck of left femur, initial encounter for closed fracture: Secondary | ICD-10-CM | POA: Insufficient documentation

## 2016-10-17 ENCOUNTER — Ambulatory Visit (INDEPENDENT_AMBULATORY_CARE_PROVIDER_SITE_OTHER): Payer: Medicare Other | Admitting: *Deleted

## 2016-10-17 DIAGNOSIS — I4892 Unspecified atrial flutter: Secondary | ICD-10-CM | POA: Diagnosis not present

## 2016-10-17 DIAGNOSIS — Z7901 Long term (current) use of anticoagulants: Secondary | ICD-10-CM | POA: Diagnosis not present

## 2016-10-17 LAB — POCT INR: INR: 3.5

## 2016-10-26 DIAGNOSIS — S72112A Displaced fracture of greater trochanter of left femur, initial encounter for closed fracture: Secondary | ICD-10-CM | POA: Diagnosis not present

## 2016-10-30 ENCOUNTER — Encounter: Payer: Self-pay | Admitting: Family Medicine

## 2016-10-30 DIAGNOSIS — S72115A Nondisplaced fracture of greater trochanter of left femur, initial encounter for closed fracture: Secondary | ICD-10-CM | POA: Insufficient documentation

## 2016-10-31 ENCOUNTER — Ambulatory Visit (INDEPENDENT_AMBULATORY_CARE_PROVIDER_SITE_OTHER): Payer: Medicare Other

## 2016-10-31 DIAGNOSIS — Z7901 Long term (current) use of anticoagulants: Secondary | ICD-10-CM | POA: Diagnosis not present

## 2016-10-31 DIAGNOSIS — I4892 Unspecified atrial flutter: Secondary | ICD-10-CM

## 2016-10-31 LAB — POCT INR: INR: 2.4

## 2016-11-06 ENCOUNTER — Telehealth: Payer: Self-pay | Admitting: Cardiology

## 2016-11-06 MED ORDER — ENALAPRIL MALEATE 5 MG PO TABS: 5.0000 mg | ORAL_TABLET | Freq: Two times a day (BID) | ORAL | 3 refills | Status: DC

## 2016-11-06 NOTE — Telephone Encounter (Signed)
Spoke with pt, per raquel pharm md, patient advised to decrease enalapril to 5 mg twice daily. He has a follow up the end of the month and will call if bp does not come up.

## 2016-11-06 NOTE — Telephone Encounter (Signed)
New message    Pt c/o BP issue: STAT if pt c/o blurred vision, one-sided weakness or slurred speech  1. What are your last 5 BP readings? 75/35, 80/40 - never gets to 100  2. Are you having any other symptoms (ex. Dizziness, headache, blurred vision, passed out)? Dizziness when standing up  3. What is your BP issue? Running low. Pt thinks bp is too low due to medicines.

## 2016-11-13 ENCOUNTER — Telehealth: Payer: Self-pay | Admitting: Cardiology

## 2016-11-13 NOTE — Telephone Encounter (Signed)
Called the patient back to inform him to hold the enalapril and to keep a log of his blood pressure and heart rates. He should bring this in to his next appointment.  If his blood pressure did not get any better he has been informed to call back. He verbalized his understanding.

## 2016-11-13 NOTE — Telephone Encounter (Signed)
New message    Pt c/o BP issue: STAT if pt c/o blurred vision, one-sided weakness or slurred speech  1. What are your last 5 BP readings? Pt states he does not have exact reading but it stays at or below 100 and he has no energy  2. Are you having any other symptoms (ex. Dizziness, headache, blurred vision, passed out)? Pt is weak, pt bp is also low  3. What is your BP issue? BP is really low

## 2016-11-13 NOTE — Telephone Encounter (Signed)
Returned the phone call to the patient. He stated that he is still having low blood pressures. He had called in on 7/31 and his enalapril was lowered to 5 mg bid. He is on carvedilol 9.375 mg bid. He complains of occasional dizziness and has been advised to rise slowly from a sitting position. His blood pressure yesterday was 73/40 which he stated was the lowest it has been. It is normally around the 61'Q systolic. Will route to pharmd and his physician for their recommendation.

## 2016-11-13 NOTE — Telephone Encounter (Signed)
Okay to hold enalapril for now.   Next F/u visit with Dr [name] schedule for 8/27. Okay to with pharmacist for BP check.   Patient to keep log of BP and HR readings

## 2016-11-14 NOTE — Telephone Encounter (Signed)
Agree 

## 2016-11-28 ENCOUNTER — Ambulatory Visit (INDEPENDENT_AMBULATORY_CARE_PROVIDER_SITE_OTHER): Payer: Medicare Other | Admitting: *Deleted

## 2016-11-28 DIAGNOSIS — I4892 Unspecified atrial flutter: Secondary | ICD-10-CM | POA: Diagnosis not present

## 2016-11-28 DIAGNOSIS — Z7901 Long term (current) use of anticoagulants: Secondary | ICD-10-CM | POA: Diagnosis not present

## 2016-11-28 LAB — POCT INR: INR: 3.2

## 2016-12-02 NOTE — Progress Notes (Signed)
HPI [name] presents for follow up of his cardiomyopathy and atrial flutter. Since I last saw him he has seen Dr. Lovena [name].  He has been having low BPs and has called to discuss this.  He has had dizziness.  He has been holding his enalapril at our direction.  He is fatigued but he has had no syncope.  The patient denies any new symptoms such as chest discomfort, neck or arm discomfort.    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   Allergies  Allergen Reactions  . Clarithromycin Nausea Only  . Famotidine Other (See Comments)    ABD. PAIN  . Levaquin [Levofloxacin] Other (See Comments)    Stomach pain, has to eat a lot of food  . Omeprazole Diarrhea  . Oxytetracycline Other (See Comments)    BUMPS  . Penicillins Swelling      . Proton Pump Inhibitors Other (See Comments)    GI upset, diarrhea, gas, bloating  . Ranitidine Diarrhea  . Doxycycline Rash    Current Outpatient Prescriptions  Medication Sig Dispense Refill  . acetaminophen (TYLENOL) 500 MG tablet Take 250 mg by mouth daily as needed. Pain    . ALPRAZolam (XANAX) 0.25 MG tablet Take 1 tablet (0.25 mg total) by mouth daily as needed for anxiety. 30 tablet 0  . alum & mag hydroxide-simeth (MAALOX/MYLANTA) 200-200-20 MG/5ML suspension Take 15 mLs by mouth daily as needed for indigestion or heartburn.    . budesonide-formoterol (SYMBICORT) 160-4.5 MCG/ACT inhaler Inhale 2 puffs into the lungs at bedtime.    . carvedilol (COREG) 6.25 MG tablet Take 1.5 tablets (9.375 mg total) by mouth 2 (two) times daily with a meal. 270 tablet 3  . fluticasone (FLONASE) 50 MCG/ACT nasal spray Place 1 spray into both nostrils daily.    . furosemide (LASIX) 20 MG tablet Take 1 tablet (20 mg total) by mouth daily. 90 tablet 2  . guaiFENesin 200 MG tablet Take 400 mg by mouth every 4 (four) hours as needed for cough or to loosen phlegm.    . levalbuterol (XOPENEX HFA) 45 MCG/ACT inhaler Inhale 1-2 puffs  into the lungs every 6 (six) hours as needed for wheezing or shortness of breath. 1 Inhaler 1  . Loratadine 10 MG CAPS Take 1 capsule by mouth daily as needed (allergies).     . montelukast (SINGULAIR) 10 MG tablet Take 10 mg by mouth daily as needed (BREATHING).     Marland Kitchen ondansetron (ZOFRAN-ODT) 4 MG disintegrating tablet Take 1 tablet (4 mg total) by mouth every 8 (eight) hours as needed for nausea or vomiting. 20 tablet 0  . polyethylene glycol (MIRALAX / GLYCOLAX) packet Take 17 g by mouth daily as needed (constipation).     . vitamin B-12 (CYANOCOBALAMIN) 500 MCG tablet Take 500 mg by mouth daily.    Marland Kitchen warfarin (COUMADIN) 5 MG tablet TAKE AS DIRECTED  BY  ANTICOAGULATION  CLINIC 135 tablet 3   No current facility-administered medications for this visit.     Past Medical History:  Diagnosis Date  . AICD (automatic cardioverter/defibrillator) present    s/p gen change 04/2015 w/ [name]  [name], Serial number [name]  . Allergic rhinitis    [name]  . Anemia   . Anxiety   . Arthritis   . Atrial flutter ([name])    A.  Status post cardioversion; B.  Tikosyn therapy - failed, remains in aflutter  . Bilateral  pneumonia 11/02/2013   treated with levaquin  . BPH (benign prostatic hyperplasia)   . 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 Lucky [name])  . Chronic sinusitis   . Chronic systolic congestive heart failure ([place])   . COPD (chronic obstructive pulmonary disease) ([place])    spirometry 2015 - no obstruction, + mild restrictive lung disease  . Extrinsic asthma    [name]  . GERD (gastroesophageal reflux disease) 2010   [name]/o esophageal stricture with dilation, LA grade C reflux esophagitis by EGD 2010  . Goiter   . History of diverticulitis of colon   . History of GI bleed    Secondary to hemorrhoids  . IBS (irritable bowel syndrome)   . LBBB (left bundle branch block)   . Multiple pulmonary nodules 06/2013   RUL ([facility]. Adam [name] at [name]) -  ?vasculitis as of last CT at [name]  . NICM (nonischemic cardiomyopathy) [name])    Cardiac catheterization March 2006 without coronary disease; echocardiogram August 2008: EF 35%, mild AI, left atrial enlargement, EF 20-25% 09/2014  . [name])     Past Surgical History:  Procedure Laterality Date  . BACK SURGERY  1997   bulging disks  . BiV-AICD implant     [name]  . CARDIAC CATHETERIZATION  06/22/04   Severe, nonischemic cardiomyopathy,EF 25-30%  . CARDIOVERSION  02/22/09   AFlutter-([facility])  . CHOLECYSTECTOMY    . COLONOSCOPY  10/99   Divertic, splenic, hepatic fleure only  . COLONOSCOPY  10/21/08   aborted-divertics, int hemms (Dr. Vira [name])  . CORONARY ANGIOPLASTY    . EP IMPLANTABLE DEVICE N/A 04/25/2015   Procedure: BIV ICD Generator Changeout;  Surgeon: [name] Lance, [name];  Location: [name];  Service: Cardiovascular;  Laterality: N/A;  . ESOPHAGEAL DILATION  02/18/98  . ESOPHAGOGASTRODUODENOSCOPY  01/1998   stricture, sliding HH, GERD  . ESOPHAGOGASTRODUODENOSCOPY  04/08/01   stricture, gastritis, HH, GERD-no dilation(Dr. Henrene [name])  . ESOPHAGOGASTRODUODENOSCOPY  12/22/04   stricture; gastritis; duodenitis, GERD  . ESOPHAGOGASTRODUODENOSCOPY  10/21/08   Reflux esophagitis; Erythem. Duod. (Dr. Vira [name])  . ESOPHAGOGASTRODUODENOSCOPY  08/2013   erythema gastric fundus - minimal gastritis, HH ([name])  . ESOPHAGOGASTRODUODENOSCOPY  08/2016   dysphagia - mod schatzki rin, dilated [name])  . ESOPHAGOGASTRODUODENOSCOPY (EGD) WITH PROPOFOL N/A 08/24/2016   mod schatzki ring dilated, duodenal deformity [name], [name] Pound, [name])  . goiter removal    . HERNIA REPAIR    . HERNIA REPAIR  01/30/02   Dr. Hassell [name]  . INSERT / REPLACE / REMOVE PACEMAKER    . NOSE SURGERY  1971  . PENILE PROSTHESIS IMPLANT  2007   [name]  . PFT  10/2013   FVC 61%, FEV1 63%, ratio 0.76  . Pulmonary eval  04/2002   ([name]) Chronic congestive symptoms  . REVERSE SHOULDER ARTHROPLASTY Right 01/17/2016     Procedure: REVERSE SHOULDER ARTHROPLASTY;  Surgeon: Corky Mull, [name];  Location: [facility];  Service: Orthopedics;  Laterality: Right;  . REVERSE TOTAL SHOULDER ARTHROPLASTY Right 01/2016   Poggi  . US ECHOCARDIOGRAPHY  07/13/04   EF 25-30%, Mod LVH; LA severe dilation; Mild AR; IRTR  . US ECHOCARDIOGRAPHY  11/27/06   hypokinesis posterior wall, EF 35%; mild AR    ROS:  As stated in the HPI and negative for all other systems.  PHYSICAL EXAM BP 119/78   Pulse 85   Ht 5\' 6"  (1.676 m)   Wt 142 lb (64.4 kg)  BMI 22.92 kg/m   GENERAL:  Well appearing NECK:  No jugular venous distention, waveform within normal limits, carotid upstroke brisk and symmetric, no bruits, no thyromegaly LUNGS:  Clear to auscultation bilaterally BACK:  No CVA tenderness CHEST:  Unremarkable HEART:  PMI not displaced or sustained,S1 and S2 within normal limits, no S3, no S4, no clicks, no rubs, no murmurs ABD:  Flat, positive bowel sounds normal in frequency in pitch, no bruits, no rebound, no guarding, no midline pulsatile mass, no hepatomegaly, no splenomegaly EXT:  2 plus pulses throughout, no edema, no cyanosis no clubbing   EKG:  NA.  12/04/2016  ASSESSMENT AND PLAN  Nonischemic cardiomyopathy -  He seems to be euvolemic although his blood pressure is low.    He's going to continue to hold his ACE inhibitor. He'll continue with the low dose beta blocker.  No further testing is planned at this point other than labs today include CBC and basic metabolic profile on file.  Atrial flutter -  He tolerates this rhythm with rate control and anticoagulation.    [name] has a CHA2DS2 - VASc score of 3.  Pulmonary HTN - He had an echo in Jan.  I will follow up with repeat echo probably next Jan.

## 2016-12-03 ENCOUNTER — Ambulatory Visit (INDEPENDENT_AMBULATORY_CARE_PROVIDER_SITE_OTHER): Payer: Medicare Other | Admitting: Cardiology

## 2016-12-03 ENCOUNTER — Encounter: Payer: Self-pay | Admitting: Cardiology

## 2016-12-03 VITALS — BP 119/78 | HR 85 | Ht 66.0 in | Wt 142.0 lb

## 2016-12-03 DIAGNOSIS — Z79899 Other long term (current) drug therapy: Secondary | ICD-10-CM

## 2016-12-03 DIAGNOSIS — R5383 Other fatigue: Secondary | ICD-10-CM

## 2016-12-03 DIAGNOSIS — I483 Typical atrial flutter: Secondary | ICD-10-CM | POA: Diagnosis not present

## 2016-12-03 NOTE — Patient Instructions (Signed)
Medication Instructions:  Continue current medications  If you need a refill on your cardiac medications before your next appointment, please call your pharmacy.  Labwork: CBC,BMP,TSH HERE IN OUR OFFICE AT [facility]  Testing/Procedures: None Ordered  Follow-Up: Your physician wants you to follow-up in: 4 Months.    Thank you for choosing [facility]!!

## 2016-12-04 ENCOUNTER — Encounter: Payer: Self-pay | Admitting: Cardiology

## 2016-12-04 DIAGNOSIS — R5383 Other fatigue: Secondary | ICD-10-CM | POA: Insufficient documentation

## 2016-12-04 LAB — BASIC METABOLIC PANEL
BUN/Creatinine Ratio: 15 (ref 10–24)
BUN: 18 mg/dL (ref 8–27)
CO2: 23 mmol/L (ref 20–29)
Calcium: 9.2 mg/dL (ref 8.6–10.2)
Chloride: 101 mmol/L (ref 96–106)
Creatinine, Ser: 1.19 mg/dL (ref 0.76–1.27)
GFR calc Af Amer: 65 mL/min/{1.73_m2} (ref >59)
GFR calc non Af Amer: 56 mL/min/{1.73_m2} — ABNORMAL LOW (ref >59)
Glucose: 78 mg/dL (ref 65–99)
Potassium: 4.4 mmol/L (ref 3.5–5.2)
Sodium: 141 mmol/L (ref 134–144)

## 2016-12-04 LAB — CBC
Hematocrit: 35.9 % — ABNORMAL LOW (ref 37.5–51.0)
Hemoglobin: 12.2 g/dL — ABNORMAL LOW (ref 13.0–17.7)
MCH: 29.8 pg (ref 26.6–33.0)
MCHC: 34 g/dL (ref 31.5–35.7)
MCV: 88 fL (ref 79–97)
Platelets: 124 10*3/uL — ABNORMAL LOW (ref 150–379)
RBC: 4.1 x10E6/uL — ABNORMAL LOW (ref 4.14–5.80)
RDW: 16.3 % — ABNORMAL HIGH (ref 12.3–15.4)
WBC: 5.4 10*3/uL (ref 3.4–10.8)

## 2016-12-04 LAB — TSH: TSH: 3.13 u[IU]/mL (ref 0.450–4.500)

## 2016-12-19 ENCOUNTER — Ambulatory Visit (INDEPENDENT_AMBULATORY_CARE_PROVIDER_SITE_OTHER): Payer: Medicare Other

## 2016-12-19 DIAGNOSIS — I4892 Unspecified atrial flutter: Secondary | ICD-10-CM

## 2016-12-19 DIAGNOSIS — I483 Typical atrial flutter: Secondary | ICD-10-CM | POA: Diagnosis not present

## 2016-12-19 DIAGNOSIS — Z7901 Long term (current) use of anticoagulants: Secondary | ICD-10-CM

## 2016-12-19 LAB — POCT INR: INR: 3.4

## 2016-12-20 ENCOUNTER — Ambulatory Visit (INDEPENDENT_AMBULATORY_CARE_PROVIDER_SITE_OTHER): Payer: Medicare Other | Admitting: *Deleted

## 2016-12-20 ENCOUNTER — Telehealth: Payer: Self-pay | Admitting: Cardiology

## 2016-12-20 DIAGNOSIS — I428 Other cardiomyopathies: Secondary | ICD-10-CM

## 2016-12-20 DIAGNOSIS — I5022 Chronic systolic (congestive) heart failure: Secondary | ICD-10-CM

## 2016-12-20 NOTE — Telephone Encounter (Signed)
Spoke with pt and reminded pt of remote transmission that is due today. Pt verbalized understanding.   

## 2016-12-21 ENCOUNTER — Encounter: Payer: Self-pay | Admitting: Cardiology

## 2016-12-21 NOTE — Progress Notes (Signed)
Remote ICD transmission.   

## 2016-12-27 LAB — CUP PACEART REMOTE DEVICE CHECK
Battery Remaining Longevity: 74 mo
Battery Voltage: 2.97 V
Brady Statistic AP VP Percent: 0 %
Brady Statistic AP VS Percent: 0 %
Brady Statistic AS VP Percent: 97.52 %
Brady Statistic AS VS Percent: 2.48 %
Brady Statistic RA Percent Paced: 0 %
Brady Statistic RV Percent Paced: 97.41 %
Date Time Interrogation Session: 20180913160737
HighPow Impedance: 35 Ohm
HighPow Impedance: 46 Ohm
Implantable Lead Implant Date: 20070525
Implantable Lead Implant Date: 20070525
Implantable Lead Implant Date: 20070525
Implantable Lead Location: 753858
Implantable Lead Location: 753859
Implantable Lead Location: 753860
Implantable Lead Model: 4194
Implantable Lead Model: 5076
Implantable Lead Model: 6949
Implantable Pulse Generator Implant Date: 20170116
Lead Channel Impedance Value: 171 Ohm
Lead Channel Impedance Value: 285 Ohm
Lead Channel Impedance Value: 304 Ohm
Lead Channel Impedance Value: 399 Ohm
Lead Channel Impedance Value: 456 Ohm
Lead Channel Impedance Value: 456 Ohm
Lead Channel Pacing Threshold Amplitude: 0.75 V
Lead Channel Pacing Threshold Amplitude: 1.125 V
Lead Channel Pacing Threshold Pulse Width: 0.4 ms
Lead Channel Pacing Threshold Pulse Width: 0.4 ms
Lead Channel Sensing Intrinsic Amplitude: 17.5 mV
Lead Channel Sensing Intrinsic Amplitude: 17.5 mV
Lead Channel Sensing Intrinsic Amplitude: 4 mV
Lead Channel Sensing Intrinsic Amplitude: 4 mV
Lead Channel Setting Pacing Amplitude: 1.75 V
Lead Channel Setting Pacing Amplitude: 2.5 V
Lead Channel Setting Pacing Pulse Width: 0.4 ms
Lead Channel Setting Pacing Pulse Width: 0.4 ms
Lead Channel Setting Sensing Sensitivity: 0.45 mV

## 2017-01-02 ENCOUNTER — Ambulatory Visit (INDEPENDENT_AMBULATORY_CARE_PROVIDER_SITE_OTHER): Payer: Medicare Other

## 2017-01-02 DIAGNOSIS — I483 Typical atrial flutter: Secondary | ICD-10-CM | POA: Diagnosis not present

## 2017-01-02 DIAGNOSIS — Z7901 Long term (current) use of anticoagulants: Secondary | ICD-10-CM

## 2017-01-02 DIAGNOSIS — I4892 Unspecified atrial flutter: Secondary | ICD-10-CM

## 2017-01-02 LAB — POCT INR: INR: 3.8

## 2017-01-16 ENCOUNTER — Ambulatory Visit (INDEPENDENT_AMBULATORY_CARE_PROVIDER_SITE_OTHER): Payer: Medicare Other

## 2017-01-16 DIAGNOSIS — I4892 Unspecified atrial flutter: Secondary | ICD-10-CM | POA: Diagnosis not present

## 2017-01-16 DIAGNOSIS — I483 Typical atrial flutter: Secondary | ICD-10-CM | POA: Diagnosis not present

## 2017-01-16 DIAGNOSIS — Z7901 Long term (current) use of anticoagulants: Secondary | ICD-10-CM | POA: Diagnosis not present

## 2017-01-16 LAB — POCT INR: INR: 2.7

## 2017-02-02 ENCOUNTER — Other Ambulatory Visit: Payer: Self-pay | Admitting: Family Medicine

## 2017-02-04 NOTE — Telephone Encounter (Signed)
Last filled:  04/13/16, #60 Last OV:  07/12/16 Next OV:  none

## 2017-02-04 NOTE — Telephone Encounter (Signed)
Refill left on vm at pharmacy.  

## 2017-02-04 NOTE — Telephone Encounter (Signed)
plz phone in. 

## 2017-02-07 DIAGNOSIS — H1045 Other chronic allergic conjunctivitis: Secondary | ICD-10-CM | POA: Diagnosis not present

## 2017-02-07 DIAGNOSIS — J4530 Mild persistent asthma, uncomplicated: Secondary | ICD-10-CM | POA: Diagnosis not present

## 2017-02-07 DIAGNOSIS — J300 Vasomotor rhinitis: Secondary | ICD-10-CM | POA: Diagnosis not present

## 2017-02-07 DIAGNOSIS — K219 Gastro-esophageal reflux disease without esophagitis: Secondary | ICD-10-CM | POA: Diagnosis not present

## 2017-02-08 ENCOUNTER — Telehealth: Payer: Self-pay | Admitting: Pharmacist

## 2017-02-08 NOTE — Telephone Encounter (Signed)
Patient started prednisone taper and z-pack yesterday x 4 days.   Noted recent INR was 2.7 and next f/u visit already scheduled for next week Nov/7.   Patient was instruted to skip warfarin dose today ONLY, thes resume stable dose and f/u as previously scheduled on Nov/7

## 2017-02-12 DIAGNOSIS — D225 Melanocytic nevi of trunk: Secondary | ICD-10-CM | POA: Diagnosis not present

## 2017-02-12 DIAGNOSIS — L570 Actinic keratosis: Secondary | ICD-10-CM | POA: Diagnosis not present

## 2017-02-12 DIAGNOSIS — X32XXXA Exposure to sunlight, initial encounter: Secondary | ICD-10-CM | POA: Diagnosis not present

## 2017-02-12 DIAGNOSIS — D2262 Melanocytic nevi of left upper limb, including shoulder: Secondary | ICD-10-CM | POA: Diagnosis not present

## 2017-02-12 DIAGNOSIS — D2261 Melanocytic nevi of right upper limb, including shoulder: Secondary | ICD-10-CM | POA: Diagnosis not present

## 2017-02-13 ENCOUNTER — Ambulatory Visit (INDEPENDENT_AMBULATORY_CARE_PROVIDER_SITE_OTHER): Payer: Medicare Other

## 2017-02-13 DIAGNOSIS — I483 Typical atrial flutter: Secondary | ICD-10-CM

## 2017-02-13 DIAGNOSIS — Z7901 Long term (current) use of anticoagulants: Secondary | ICD-10-CM

## 2017-02-13 DIAGNOSIS — I4892 Unspecified atrial flutter: Secondary | ICD-10-CM | POA: Diagnosis not present

## 2017-02-13 LAB — POCT INR: INR: 3.9

## 2017-02-18 DIAGNOSIS — M25541 Pain in joints of right hand: Secondary | ICD-10-CM | POA: Diagnosis not present

## 2017-02-18 DIAGNOSIS — M7582 Other shoulder lesions, left shoulder: Secondary | ICD-10-CM | POA: Diagnosis not present

## 2017-02-18 DIAGNOSIS — M75122 Complete rotator cuff tear or rupture of left shoulder, not specified as traumatic: Secondary | ICD-10-CM | POA: Diagnosis not present

## 2017-02-18 DIAGNOSIS — M1811 Unilateral primary osteoarthritis of first carpometacarpal joint, right hand: Secondary | ICD-10-CM | POA: Diagnosis not present

## 2017-03-06 ENCOUNTER — Ambulatory Visit (INDEPENDENT_AMBULATORY_CARE_PROVIDER_SITE_OTHER): Payer: Medicare Other

## 2017-03-06 DIAGNOSIS — I4892 Unspecified atrial flutter: Secondary | ICD-10-CM

## 2017-03-06 DIAGNOSIS — Z7901 Long term (current) use of anticoagulants: Secondary | ICD-10-CM

## 2017-03-06 DIAGNOSIS — I483 Typical atrial flutter: Secondary | ICD-10-CM

## 2017-03-06 LAB — POCT INR: INR: 2.7

## 2017-03-06 NOTE — Patient Instructions (Signed)
Please continue taking dosage 5 mg everyday except 1/2 tablet on Mondays.    Recheck in 4 weeks.

## 2017-03-20 ENCOUNTER — Encounter: Payer: Self-pay | Admitting: Cardiology

## 2017-03-21 ENCOUNTER — Telehealth: Payer: Self-pay | Admitting: Cardiology

## 2017-03-21 ENCOUNTER — Ambulatory Visit (INDEPENDENT_AMBULATORY_CARE_PROVIDER_SITE_OTHER): Payer: Medicare Other | Admitting: *Deleted

## 2017-03-21 DIAGNOSIS — J4530 Mild persistent asthma, uncomplicated: Secondary | ICD-10-CM | POA: Diagnosis not present

## 2017-03-21 DIAGNOSIS — J300 Vasomotor rhinitis: Secondary | ICD-10-CM | POA: Diagnosis not present

## 2017-03-21 DIAGNOSIS — I5022 Chronic systolic (congestive) heart failure: Secondary | ICD-10-CM

## 2017-03-21 DIAGNOSIS — I428 Other cardiomyopathies: Secondary | ICD-10-CM

## 2017-03-21 DIAGNOSIS — H1045 Other chronic allergic conjunctivitis: Secondary | ICD-10-CM | POA: Diagnosis not present

## 2017-03-21 DIAGNOSIS — J019 Acute sinusitis, unspecified: Secondary | ICD-10-CM | POA: Diagnosis not present

## 2017-03-21 NOTE — Telephone Encounter (Signed)
Spoke with pt and reminded pt of remote transmission that is due today. Pt verbalized understanding.   

## 2017-03-22 ENCOUNTER — Encounter: Payer: Self-pay | Admitting: Cardiology

## 2017-03-24 NOTE — Progress Notes (Signed)
HPI [name] presents for follow up of his cardiomyopathy and atrial flutter. Since I last saw him he has done OK.  He walks to the mailbox daily and gets slightly winded.  However, he has no distress.  The patient denies any new symptoms such as chest discomfort, neck or arm discomfort. There has been no new shortness of breath, PND or orthopnea. There have been no reported palpitations, presyncope or syncope.  His wife says that he is slowing down.     Allergies  Allergen Reactions  . Clarithromycin Nausea Only  . Famotidine Other (See Comments)    ABD. PAIN  . Levaquin [Levofloxacin] Other (See Comments)    Stomach pain, has to eat a lot of food  . Omeprazole Diarrhea  . Oxytetracycline Other (See Comments)    BUMPS  . Penicillins Swelling      . Proton Pump Inhibitors Other (See Comments)    GI upset, diarrhea, gas, bloating  . Ranitidine Diarrhea  . Doxycycline Rash    Current Outpatient Medications  Medication Sig Dispense Refill  . acetaminophen (TYLENOL) 500 MG tablet Take 250 mg by mouth daily as needed. Pain    . ALPRAZolam (XANAX) 0.25 MG tablet TAKE 1 TABLET BY MOUTH AT BEDTIME 60 tablet 0  . alum & mag hydroxide-simeth (MAALOX/MYLANTA) 200-200-20 MG/5ML suspension Take 15 mLs by mouth daily as needed for indigestion or heartburn.    . budesonide-formoterol (SYMBICORT) 160-4.5 MCG/ACT inhaler Inhale 2 puffs into the lungs at bedtime.    . carvedilol (COREG) 6.25 MG tablet Take 1.5 tablets (9.375 mg total) by mouth 2 (two) times daily with a meal. 270 tablet 3  . fluticasone (FLONASE) 50 MCG/ACT nasal spray Place 1 spray into both nostrils daily.    . furosemide (LASIX) 20 MG tablet Take 1 tablet (20 mg total) by mouth daily. 90 tablet 2  . guaiFENesin 200 MG tablet Take 400 mg by mouth every 4 (four) hours as needed for cough or to loosen phlegm.    . levalbuterol (XOPENEX HFA) 45 MCG/ACT inhaler Inhale 1-2 puffs into the lungs every 6 (six) hours as  needed for wheezing or shortness of breath. 1 Inhaler 1  . levofloxacin (LEVAQUIN) 500 MG tablet Take 500 mg by mouth daily.    . Loratadine 10 MG CAPS Take 1 capsule by mouth daily as needed (allergies).     . montelukast (SINGULAIR) 10 MG tablet Take 10 mg by mouth daily as needed (BREATHING).     Marland Kitchen ondansetron (ZOFRAN-ODT) 4 MG disintegrating tablet Take 1 tablet (4 mg total) by mouth every 8 (eight) hours as needed for nausea or vomiting. 20 tablet 0  . polyethylene glycol (MIRALAX / GLYCOLAX) packet Take 17 g by mouth daily as needed (constipation).     . vitamin B-12 (CYANOCOBALAMIN) 500 MCG tablet Take 500 mg by mouth daily.    Marland Kitchen warfarin (COUMADIN) 5 MG tablet TAKE AS DIRECTED  BY  ANTICOAGULATION  CLINIC 135 tablet 3   No current facility-administered medications for this visit.     Past Medical History:  Diagnosis Date  . AICD (automatic cardioverter/defibrillator) present    s/p gen change 04/2015 w/ MDT [name] Crt-D  [id number], Serial number [id number]  . Allergic rhinitis    [name]  . Anemia   . Anxiety   . Arthritis   . Atrial flutter ([place])    A.  Status post cardioversion; B.  Tikosyn therapy - failed, remains in aflutter  .  Bilateral pneumonia 11/02/2013   treated with levaquin  . BPH (benign prostatic hyperplasia)   . 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 Lucky Cowboy)  . Chronic sinusitis   . Chronic systolic congestive heart failure ([place])   . COPD (chronic obstructive pulmonary disease) ([place])    spirometry 2015 - no obstruction, + mild restrictive lung disease  . Extrinsic asthma    [name]  . GERD (gastroesophageal reflux disease) 2010   h/o esophageal stricture with dilation, LA grade C reflux esophagitis by EGD 2010  . Goiter   . History of diverticulitis of colon   . History of GI bleed    Secondary to hemorrhoids  . IBS (irritable bowel syndrome)   . LBBB (left bundle branch block)   . Multiple pulmonary nodules 06/2013   RUL ([facility]. Adam Phenix  at [facility]) - ?vasculitis as of last CT at [facility]  . NICM (nonischemic cardiomyopathy) [facility])    Cardiac catheterization March 2006 without coronary disease; EF 25% 2018 Jan  . [facility])     Past Surgical History:  Procedure Laterality Date  . BACK SURGERY  1997   bulging disks  . BiV-AICD implant     [employer]  . CARDIAC CATHETERIZATION  06/22/04   Severe, nonischemic cardiomyopathy,EF 25-30%  . CARDIOVERSION  02/22/09   AFlutter-([facility])  . CHOLECYSTECTOMY    . COLONOSCOPY  10/99   Divertic, splenic, hepatic fleure only  . COLONOSCOPY  10/21/08   aborted-divertics, int hemms (Dr. Vira Agar)  . CORONARY ANGIOPLASTY    . EP IMPLANTABLE DEVICE N/A 04/25/2015   Procedure: BIV ICD Generator Changeout;  Surgeon: Evans Lance, MD;  Location: [place] [facility];  Service: Cardiovascular;  Laterality: N/A;  . ESOPHAGEAL DILATION  02/18/98  . ESOPHAGOGASTRODUODENOSCOPY  01/1998   stricture, sliding HH, GERD  . ESOPHAGOGASTRODUODENOSCOPY  04/08/01   stricture, gastritis, HH, GERD-no dilation(Dr. Henrene Pastor)  . ESOPHAGOGASTRODUODENOSCOPY  12/22/04   stricture; gastritis; duodenitis, GERD  . ESOPHAGOGASTRODUODENOSCOPY  10/21/08   Reflux esophagitis; Erythem. Duod. (Dr. Vira Agar)  . ESOPHAGOGASTRODUODENOSCOPY  08/2013   erythema gastric fundus - minimal gastritis, HH ([place])  . ESOPHAGOGASTRODUODENOSCOPY  08/2016   dysphagia - mod schatzki rin, dilated Tiffany Kocher)  . ESOPHAGOGASTRODUODENOSCOPY (EGD) WITH PROPOFOL N/A 08/24/2016   mod schatzki ring dilated, duodenal deformity Vira Agar, Gavin Pound, MD)  . goiter removal    . HERNIA REPAIR    . HERNIA REPAIR  01/30/02   Dr. Hassell Done  . INSERT / REPLACE / REMOVE PACEMAKER    . NOSE SURGERY  1971  . PENILE PROSTHESIS IMPLANT  2007   [place]  . PFT  10/2013   FVC 61%, FEV1 63%, ratio 0.76  . Pulmonary eval  04/2002   ([facility]) Chronic congestive symptoms  . REVERSE SHOULDER ARTHROPLASTY Right 01/17/2016   Procedure: REVERSE SHOULDER ARTHROPLASTY;   Surgeon: Corky Mull, MD;  Location: [facility];  Service: Orthopedics;  Laterality: Right;  . REVERSE TOTAL SHOULDER ARTHROPLASTY Right 01/2016   Poggi  . US ECHOCARDIOGRAPHY  07/13/04   EF 25-30%, Mod LVH; LA severe dilation; Mild AR; IRTR  . US ECHOCARDIOGRAPHY  11/27/06   hypokinesis posterior wall, EF 35%; mild AR    ROS:  As stated in the HPI and negative for all other systems.  PHYSICAL EXAM BP 108/60 (BP Location: Left Arm, Patient Position: Sitting, Cuff Size: Normal)   Pulse 70   Ht 5\' 6"  (1.676 m)   Wt 142 lb 12.8  oz (64.8 kg)   BMI 23.05 kg/m   GENERAL:  Well appearing NECK:  No jugular venous distention, waveform within normal limits, carotid upstroke brisk and symmetric, no bruits, no thyromegaly LUNGS:  Clear to auscultation bilaterally CHEST:  Unremarkable HEART:  PMI not displaced or sustained,S1 and S2 within normal limits, no S3, no S4, no clicks, no rubs, 2 out of 6 diastolic murmur and slight systolic murmur heard best at the apex ABD:  Flat, positive bowel sounds normal in frequency in pitch, no bruits, no rebound, no guarding, no midline pulsatile mass, no hepatomegaly, no splenomegaly EXT:  2 plus pulses throughout, no edema, no cyanosis no clubbing   EKG:  NA    03/25/2017  ASSESSMENT AND PLAN  Nonischemic cardiomyopathy -  He seems to be euvolemic.  He has had low blood pressure and so he cannot titrate his medications.  He will continue along with a low-dose beta-blocker.  He has reduced his diuretic and he understands as needed dosing and I think is pretty good with this.  No change in therapy is planned.   Atrial flutter -  He tolerates rate control and anticoagulation.  He had his level checked today.  No change in therapy is indicated.  Mr. DAREY HERSHBERGER has a CHA2DS2 - VASc score of 3.  Pulmonary HTN - I will repeat an echo in January.

## 2017-03-25 ENCOUNTER — Ambulatory Visit (INDEPENDENT_AMBULATORY_CARE_PROVIDER_SITE_OTHER): Payer: Medicare Other | Admitting: Cardiology

## 2017-03-25 ENCOUNTER — Encounter: Payer: Self-pay | Admitting: Cardiology

## 2017-03-25 ENCOUNTER — Ambulatory Visit (INDEPENDENT_AMBULATORY_CARE_PROVIDER_SITE_OTHER): Payer: Medicare Other | Admitting: Pharmacist Clinician (PhC)/ Clinical Pharmacy Specialist

## 2017-03-25 VITALS — BP 108/60 | HR 70 | Ht 66.0 in | Wt 142.8 lb

## 2017-03-25 DIAGNOSIS — I272 Pulmonary hypertension, unspecified: Secondary | ICD-10-CM | POA: Insufficient documentation

## 2017-03-25 DIAGNOSIS — I351 Nonrheumatic aortic (valve) insufficiency: Secondary | ICD-10-CM | POA: Diagnosis not present

## 2017-03-25 DIAGNOSIS — I428 Other cardiomyopathies: Secondary | ICD-10-CM | POA: Diagnosis not present

## 2017-03-25 DIAGNOSIS — I4892 Unspecified atrial flutter: Secondary | ICD-10-CM

## 2017-03-25 DIAGNOSIS — Z7901 Long term (current) use of anticoagulants: Secondary | ICD-10-CM | POA: Diagnosis not present

## 2017-03-25 LAB — POCT INR: INR: 3.4

## 2017-03-25 NOTE — Patient Instructions (Addendum)
Medication Instructions:  Continue current medications  If you need a refill on your cardiac medications before your next appointment, please call your pharmacy.  Labwork: None Ordered   Testing/Procedures: Your physician has requested that you have an echocardiogram in January 2019. Echocardiography is a painless test that uses sound waves to create images of your heart. It provides your doctor with information about the size and shape of your heart and how well your heart's chambers and valves are working. This procedure takes approximately one hour. There are no restrictions for this procedure.  Follow-Up: Your physician wants you to follow-up in: 6 Months. You should receive a reminder letter in the mail two months in advance. If you do not receive a letter, please call our office [phone].    Thank you for choosing [facility] at [facility]!!

## 2017-03-25 NOTE — Progress Notes (Signed)
Remote ICD transmission.   

## 2017-03-26 ENCOUNTER — Encounter: Payer: Self-pay | Admitting: Cardiology

## 2017-03-29 LAB — CUP PACEART REMOTE DEVICE CHECK
Battery Remaining Longevity: 67 mo
Battery Voltage: 2.95 V
Brady Statistic AP VP Percent: 0 %
Brady Statistic AP VS Percent: 0 %
Brady Statistic AS VP Percent: 95.5 %
Brady Statistic AS VS Percent: 4.5 %
Brady Statistic RA Percent Paced: 0 %
Brady Statistic RV Percent Paced: 96.41 %
Date Time Interrogation Session: 20181214115305
HighPow Impedance: 36 Ohm
HighPow Impedance: 46 Ohm
Implantable Lead Implant Date: 20070525
Implantable Lead Implant Date: 20070525
Implantable Lead Implant Date: 20070525
Implantable Lead Location: 753858
Implantable Lead Location: 753859
Implantable Lead Location: 753860
Implantable Lead Model: 4194
Implantable Lead Model: 5076
Implantable Lead Model: 6949
Implantable Pulse Generator Implant Date: 20170116
Lead Channel Impedance Value: 171 Ohm
Lead Channel Impedance Value: 285 Ohm
Lead Channel Impedance Value: 361 Ohm
Lead Channel Impedance Value: 399 Ohm
Lead Channel Impedance Value: 418 Ohm
Lead Channel Impedance Value: 475 Ohm
Lead Channel Pacing Threshold Amplitude: 0.75 V
Lead Channel Pacing Threshold Amplitude: 0.75 V
Lead Channel Pacing Threshold Pulse Width: 0.4 ms
Lead Channel Pacing Threshold Pulse Width: 0.4 ms
Lead Channel Sensing Intrinsic Amplitude: 17.5 mV
Lead Channel Sensing Intrinsic Amplitude: 17.5 mV
Lead Channel Sensing Intrinsic Amplitude: 6.375 mV
Lead Channel Sensing Intrinsic Amplitude: 6.375 mV
Lead Channel Setting Pacing Amplitude: 1.25 V
Lead Channel Setting Pacing Amplitude: 2.5 V
Lead Channel Setting Pacing Pulse Width: 0.4 ms
Lead Channel Setting Pacing Pulse Width: 0.4 ms
Lead Channel Setting Sensing Sensitivity: 0.45 mV

## 2017-04-03 ENCOUNTER — Ambulatory Visit (INDEPENDENT_AMBULATORY_CARE_PROVIDER_SITE_OTHER): Payer: Medicare Other

## 2017-04-03 DIAGNOSIS — Z7901 Long term (current) use of anticoagulants: Secondary | ICD-10-CM | POA: Diagnosis not present

## 2017-04-03 DIAGNOSIS — I483 Typical atrial flutter: Secondary | ICD-10-CM | POA: Diagnosis not present

## 2017-04-03 DIAGNOSIS — I4892 Unspecified atrial flutter: Secondary | ICD-10-CM | POA: Diagnosis not present

## 2017-04-03 LAB — POCT INR: INR: 2.7

## 2017-04-03 NOTE — Patient Instructions (Signed)
Please continue taking 5 mg everyday except 1/2 tablet on Mondays.    Recheck in 4 weeks.

## 2017-04-11 ENCOUNTER — Ambulatory Visit: Payer: Medicare Other

## 2017-04-15 ENCOUNTER — Ambulatory Visit (INDEPENDENT_AMBULATORY_CARE_PROVIDER_SITE_OTHER): Payer: Medicare Other | Admitting: Family Medicine

## 2017-04-15 ENCOUNTER — Encounter: Payer: Self-pay | Admitting: Family Medicine

## 2017-04-15 VITALS — BP 146/70 | HR 70 | Temp 97.6°F | Wt 143.0 lb

## 2017-04-15 DIAGNOSIS — J309 Allergic rhinitis, unspecified: Secondary | ICD-10-CM

## 2017-04-15 MED ORDER — PREDNISONE 20 MG PO TABS: 20.0000 mg | ORAL_TABLET | Freq: Every day | ORAL | 0 refills | Status: DC

## 2017-04-15 NOTE — Progress Notes (Signed)
Subjective:    Patient ID: [name], male    DOB: 1931-11-26, 84 y.o.   MRN: [id number]  HPI This is an 84 yo male, accompanied by his wife, who presents today with nasal congestion and drainage x 2 days. Has large amount of post nasal drainage, bad taste in his mouth. White drainage, not thick. No headache. Some sinus pressure under his eyes. Symptoms come and go. No fever, no ear pain, no headaches. No chest congestion, no cough or wheeze or SOB. Uses flonase nightly and saline rinse. Takes loratadine (for years) and Singulair. Takes guaifenesin nightly. Has had some nausea with drainage, improves if he eats something. Regular bowel movements, but thinks he sees mucus.  He states he was on levaquin, unsure when, for sinus issues and did not get relief until end of course (10 days). Do see where he had a course of levaquin 1/17. Prednisone has worked in the past.   Past Medical History:  Diagnosis Date  . AICD (automatic cardioverter/defibrillator) present    s/p gen change 04/2015 w/ MDT [name] Crt-D  DTBA1D1, Serial number [id number]  . Allergic rhinitis    [name]  . Anemia   . Anxiety   . Arthritis   . Atrial flutter ([place])    A.  Status post cardioversion; B.  Tikosyn therapy - failed, remains in aflutter  . Bilateral pneumonia 11/02/2013   treated with levaquin  . BPH (benign prostatic hyperplasia)   . 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 Lucky Cowboy)  . Chronic sinusitis   . Chronic systolic congestive heart failure ([place])   . COPD (chronic obstructive pulmonary disease) ([place])    spirometry 2015 - no obstruction, + mild restrictive lung disease  . Extrinsic asthma    [name]  . GERD (gastroesophageal reflux disease) 2010   h/o esophageal stricture with dilation, LA grade C reflux esophagitis by EGD 2010  . Goiter   . History of diverticulitis of colon   . History of GI bleed    Secondary to hemorrhoids  . IBS (irritable bowel syndrome)   .  LBBB (left bundle branch block)   . Multiple pulmonary nodules 06/2013   RUL ([facility]. Adam Phenix [facility]) - ?vasculitis as of last CT [facility]  . NICM (nonischemic cardiomyopathy) [facility])    Cardiac catheterization March 2006 without coronary disease; EF 25% 2018 Jan  . [facility])    Past Surgical History:  Procedure Laterality Date  . BACK SURGERY  1997   bulging disks  . BiV-AICD implant     [employer]  . CARDIAC CATHETERIZATION  06/22/04   Severe, nonischemic cardiomyopathy,EF 25-30%  . CARDIOVERSION  02/22/09   AFlutter-([facility])  . CHOLECYSTECTOMY    . COLONOSCOPY  10/99   Divertic, splenic, hepatic fleure only  . COLONOSCOPY  10/21/08   aborted-divertics, int hemms (Dr. Vira Agar)  . CORONARY ANGIOPLASTY    . EP IMPLANTABLE DEVICE N/A 04/25/2015   Procedure: BIV ICD Generator Changeout;  Surgeon: Evans Lance, MD;  Location: [facility];  Service: Cardiovascular;  Laterality: N/A;  . ESOPHAGEAL DILATION  02/18/98  . ESOPHAGOGASTRODUODENOSCOPY  01/1998   stricture, sliding HH, GERD  . ESOPHAGOGASTRODUODENOSCOPY  04/08/01   stricture, gastritis, HH, GERD-no dilation(Dr. Henrene Pastor)  . ESOPHAGOGASTRODUODENOSCOPY  12/22/04   stricture; gastritis; duodenitis, GERD  . ESOPHAGOGASTRODUODENOSCOPY  10/21/08   Reflux esophagitis; Erythem. Duod. (Dr. Vira Agar)  . ESOPHAGOGASTRODUODENOSCOPY  08/2013   erythema  gastric fundus - minimal gastritis, HH ([place])  . ESOPHAGOGASTRODUODENOSCOPY  08/2016   dysphagia - mod schatzki rin, dilated Tiffany Kocher)  . ESOPHAGOGASTRODUODENOSCOPY (EGD) WITH PROPOFOL N/A 08/24/2016   mod schatzki ring dilated, duodenal deformity Vira Agar, Gavin Pound, MD)  . goiter removal    . HERNIA REPAIR    . HERNIA REPAIR  01/30/02   Dr. Hassell Done  . INSERT / REPLACE / REMOVE PACEMAKER    . NOSE SURGERY  1971  . PENILE PROSTHESIS IMPLANT  2007   [place]  . PFT  10/2013   FVC 61%, FEV1 63%, ratio 0.76  . Pulmonary eval  04/2002   ([facility]) Chronic congestive symptoms  .  REVERSE SHOULDER ARTHROPLASTY Right 01/17/2016   Procedure: REVERSE SHOULDER ARTHROPLASTY;  Surgeon: Corky Mull, MD;  Location: [facility];  Service: Orthopedics;  Laterality: Right;  . REVERSE TOTAL SHOULDER ARTHROPLASTY Right 01/2016   Poggi  . US ECHOCARDIOGRAPHY  07/13/04   EF 25-30%, Mod LVH; LA severe dilation; Mild AR; IRTR  . US ECHOCARDIOGRAPHY  11/27/06   hypokinesis posterior wall, EF 35%; mild AR   Family History  Problem Relation Age of Onset  . Coronary artery disease Father   . Hypertension Father   . Heart failure Mother   . Dysphagia Sister   . Breast cancer Unknown   . Ovarian cancer Unknown   . Uterine cancer Unknown   . Other Sister        stomach problems  . Other Sister        stomach problems  . Other Sister        stomach problems  . Alcohol abuse Maternal Uncle    Social History   Tobacco Use  . Smoking status: Never Smoker  . Smokeless tobacco: Never Used  Substance Use Topics  . Alcohol use: No  . Drug use: No      Review of Systems Per HPI    Objective:   Physical Exam  Constitutional: He is oriented to person, place, and time. He appears well-developed and well-nourished. No distress.  HENT:  Head: Normocephalic and atraumatic.  Right Ear: Tympanic membrane, external ear and ear canal normal.  Left Ear: Tympanic membrane, external ear and ear canal normal.  Nose: Mucosal edema and rhinorrhea present. Right sinus exhibits no maxillary sinus tenderness and no frontal sinus tenderness. Left sinus exhibits no maxillary sinus tenderness and no frontal sinus tenderness.  Mouth/Throat: Uvula is midline. No oropharyngeal exudate, posterior oropharyngeal edema or posterior oropharyngeal erythema.  Visible post nasal drainage.   Eyes: Conjunctivae are normal.  Neck: Normal range of motion. Neck supple.  Cardiovascular: Normal rate, regular rhythm and normal heart sounds.  Pulmonary/Chest: Effort normal and breath sounds normal.  Lymphadenopathy:      He has no cervical adenopathy.  Neurological: He is alert and oriented to person, place, and time.  Skin: Skin is warm and dry. He is not diaphoretic.  Psychiatric: He has a normal mood and affect. His behavior is normal. Judgment and thought content normal.  Vitals reviewed.     BP (!) 146/70 (BP Location: Left Arm, Patient Position: Sitting, Cuff Size: Normal)   Pulse 70   Temp 97.6 F (36.4 C) (Oral)   Wt 143 lb (64.9 kg)   SpO2 96%   BMI 23.08 kg/m  Wt Readings from Last 3 Encounters:  04/15/17 143 lb (64.9 kg)  03/25/17 142 lb 12.8 oz (64.8 kg)  12/03/16 142 lb (64.4 kg)  Assessment & Plan:  1. Allergic rhinitis, unspecified seasonality, unspecified trigger - does not seem infectious, will try to change antihistamine, add small amount oxymetazoline nasal spray, do short course prednisone.  - predniSONE (DELTASONE) 20 MG tablet; Take 1 tablet (20 mg total) by mouth daily with breakfast.  Dispense: 5 tablet; Refill: 0 - Provided written and verbal information regarding diagnosis and treatment. - RTC precautions reviewed  Clarene Reamer, FNP-BC  [facility] [place]  04/15/2017 10:17 AM

## 2017-04-15 NOTE — Patient Instructions (Addendum)
Try to stop guinafinesin for a couple of days, may be increasing drainage  Switch your loratadine antihistamine for cetirizine to see if that helps decrease drainage  Use afrin type nasal spray 1 spray in each nostril twice a day for up to 4 days  If not better by end of the week, please let me know

## 2017-04-22 ENCOUNTER — Ambulatory Visit (INDEPENDENT_AMBULATORY_CARE_PROVIDER_SITE_OTHER): Payer: Medicare Other

## 2017-04-22 ENCOUNTER — Other Ambulatory Visit: Payer: Self-pay | Admitting: Cardiology

## 2017-04-22 ENCOUNTER — Other Ambulatory Visit: Payer: Self-pay

## 2017-04-22 DIAGNOSIS — I4892 Unspecified atrial flutter: Secondary | ICD-10-CM

## 2017-04-22 DIAGNOSIS — I429 Cardiomyopathy, unspecified: Secondary | ICD-10-CM | POA: Diagnosis not present

## 2017-04-22 DIAGNOSIS — I351 Nonrheumatic aortic (valve) insufficiency: Secondary | ICD-10-CM

## 2017-05-01 ENCOUNTER — Other Ambulatory Visit
Admission: AD | Admit: 2017-05-01 | Discharge: 2017-05-01 | Disposition: A | Discharge status: Home / Self Care | Payer: Medicare Other | Source: Ambulatory Visit | Attending: Cardiovascular Disease | Admitting: Cardiovascular Disease

## 2017-05-01 ENCOUNTER — Ambulatory Visit (INDEPENDENT_AMBULATORY_CARE_PROVIDER_SITE_OTHER): Payer: Medicare Other

## 2017-05-01 DIAGNOSIS — I4892 Unspecified atrial flutter: Secondary | ICD-10-CM | POA: Diagnosis not present

## 2017-05-01 DIAGNOSIS — R110 Nausea: Secondary | ICD-10-CM | POA: Diagnosis not present

## 2017-05-01 DIAGNOSIS — J019 Acute sinusitis, unspecified: Secondary | ICD-10-CM | POA: Diagnosis not present

## 2017-05-01 DIAGNOSIS — R791 Abnormal coagulation profile: Secondary | ICD-10-CM | POA: Insufficient documentation

## 2017-05-01 DIAGNOSIS — Z7901 Long term (current) use of anticoagulants: Secondary | ICD-10-CM

## 2017-05-01 DIAGNOSIS — B9689 Other specified bacterial agents as the cause of diseases classified elsewhere: Secondary | ICD-10-CM | POA: Diagnosis not present

## 2017-05-01 DIAGNOSIS — I483 Typical atrial flutter: Secondary | ICD-10-CM

## 2017-05-01 LAB — POCT INR: INR: >8

## 2017-05-01 LAB — PROTIME-INR
INR: 6.22
Prothrombin Time: 54.6 seconds — ABNORMAL HIGH (ref 11.4–15.2)

## 2017-05-01 NOTE — Patient Instructions (Signed)
Please skip coumadin today, tomorrow and Friday. On Saturday, resume previous dosage of 5 mg daily except 2.5 mg on Mondays. Recheck in 1 week.

## 2017-05-08 ENCOUNTER — Ambulatory Visit (INDEPENDENT_AMBULATORY_CARE_PROVIDER_SITE_OTHER): Payer: Medicare Other

## 2017-05-08 DIAGNOSIS — I4892 Unspecified atrial flutter: Secondary | ICD-10-CM

## 2017-05-08 DIAGNOSIS — J019 Acute sinusitis, unspecified: Secondary | ICD-10-CM

## 2017-05-08 DIAGNOSIS — Z7901 Long term (current) use of anticoagulants: Secondary | ICD-10-CM

## 2017-05-08 DIAGNOSIS — I483 Typical atrial flutter: Secondary | ICD-10-CM | POA: Diagnosis not present

## 2017-05-08 LAB — POCT INR: INR: 1.1

## 2017-05-08 NOTE — Patient Instructions (Signed)
Please take extra tablet today, extra 1/2 tablet tomorrow then resume previous dosage of 5 mg daily except 2.5 mg on Mondays. Recheck in 2 weeks.

## 2017-05-17 ENCOUNTER — Ambulatory Visit: Payer: Medicare Other | Admitting: Family Medicine

## 2017-05-17 DIAGNOSIS — H6980 Other specified disorders of Eustachian tube, unspecified ear: Secondary | ICD-10-CM | POA: Diagnosis not present

## 2017-05-17 DIAGNOSIS — J3489 Other specified disorders of nose and nasal sinuses: Secondary | ICD-10-CM | POA: Diagnosis not present

## 2017-05-17 DIAGNOSIS — J320 Chronic maxillary sinusitis: Secondary | ICD-10-CM | POA: Diagnosis not present

## 2017-05-20 ENCOUNTER — Ambulatory Visit (INDEPENDENT_AMBULATORY_CARE_PROVIDER_SITE_OTHER): Payer: Medicare Other

## 2017-05-20 ENCOUNTER — Telehealth: Payer: Self-pay | Admitting: Cardiovascular Disease

## 2017-05-20 DIAGNOSIS — I4892 Unspecified atrial flutter: Secondary | ICD-10-CM

## 2017-05-20 DIAGNOSIS — Z7901 Long term (current) use of anticoagulants: Secondary | ICD-10-CM | POA: Diagnosis not present

## 2017-05-20 DIAGNOSIS — I483 Typical atrial flutter: Secondary | ICD-10-CM

## 2017-05-20 LAB — POCT INR: INR: 2.9

## 2017-05-20 NOTE — Telephone Encounter (Signed)
Patient has a question about medication  Please call

## 2017-05-20 NOTE — Telephone Encounter (Signed)
Spoke w/ pt.  He reports that he was started on Prednisone taper on Friday. He reports that he has been taking 1/2 of his normal coumadin dose to account for increased INR. At pt's last ov, his INR was elevated >8.0 and [facility]. Pt did not like this experience, reporting that it took too long and he does not want to have to do this again.  Asked him to come over now for INR check. He is hesitant, as he does not want to go to the hospital. Advised him to come on and I will work him in when he gets here.

## 2017-05-20 NOTE — Patient Instructions (Addendum)
Please skip today, then resume previous dosage of 5 mg daily except 2.5 mg on Mondays. Please call the office @ [phone] if you are put on another antibiotic or another round of prednisone. Recheck in 2 weeks.

## 2017-05-24 ENCOUNTER — Emergency Department
Admission: EM | Admit: 2017-05-24 | Discharge: 2017-05-24 | Disposition: A | Discharge status: Home / Self Care | Payer: Medicare Other | Attending: Emergency Medicine | Admitting: Emergency Medicine

## 2017-05-24 ENCOUNTER — Emergency Department: Payer: Medicare Other

## 2017-05-24 ENCOUNTER — Encounter: Payer: Self-pay | Admitting: Emergency Medicine

## 2017-05-24 ENCOUNTER — Other Ambulatory Visit: Payer: Self-pay

## 2017-05-24 DIAGNOSIS — I5022 Chronic systolic (congestive) heart failure: Secondary | ICD-10-CM | POA: Insufficient documentation

## 2017-05-24 DIAGNOSIS — S51011A Laceration without foreign body of right elbow, initial encounter: Secondary | ICD-10-CM | POA: Diagnosis not present

## 2017-05-24 DIAGNOSIS — Y9301 Activity, walking, marching and hiking: Secondary | ICD-10-CM | POA: Insufficient documentation

## 2017-05-24 DIAGNOSIS — J45909 Unspecified asthma, uncomplicated: Secondary | ICD-10-CM | POA: Diagnosis not present

## 2017-05-24 DIAGNOSIS — Z23 Encounter for immunization: Secondary | ICD-10-CM | POA: Diagnosis not present

## 2017-05-24 DIAGNOSIS — S6991XA Unspecified injury of right wrist, hand and finger(s), initial encounter: Secondary | ICD-10-CM | POA: Diagnosis not present

## 2017-05-24 DIAGNOSIS — M542 Cervicalgia: Secondary | ICD-10-CM | POA: Diagnosis not present

## 2017-05-24 DIAGNOSIS — S8991XA Unspecified injury of right lower leg, initial encounter: Secondary | ICD-10-CM | POA: Diagnosis not present

## 2017-05-24 DIAGNOSIS — S50311A Abrasion of right elbow, initial encounter: Secondary | ICD-10-CM | POA: Diagnosis not present

## 2017-05-24 DIAGNOSIS — W101XXA Fall (on)(from) sidewalk curb, initial encounter: Secondary | ICD-10-CM | POA: Insufficient documentation

## 2017-05-24 DIAGNOSIS — Z7901 Long term (current) use of anticoagulants: Secondary | ICD-10-CM | POA: Diagnosis not present

## 2017-05-24 DIAGNOSIS — S0990XA Unspecified injury of head, initial encounter: Secondary | ICD-10-CM | POA: Diagnosis present

## 2017-05-24 DIAGNOSIS — Y929 Unspecified place or not applicable: Secondary | ICD-10-CM | POA: Diagnosis not present

## 2017-05-24 DIAGNOSIS — S61216A Laceration without foreign body of right little finger without damage to nail, initial encounter: Secondary | ICD-10-CM | POA: Diagnosis not present

## 2017-05-24 DIAGNOSIS — S0083XA Contusion of other part of head, initial encounter: Secondary | ICD-10-CM

## 2017-05-24 DIAGNOSIS — S60416A Abrasion of right little finger, initial encounter: Secondary | ICD-10-CM | POA: Insufficient documentation

## 2017-05-24 DIAGNOSIS — J449 Chronic obstructive pulmonary disease, unspecified: Secondary | ICD-10-CM | POA: Insufficient documentation

## 2017-05-24 DIAGNOSIS — T148XXA Other injury of unspecified body region, initial encounter: Secondary | ICD-10-CM

## 2017-05-24 DIAGNOSIS — W19XXXA Unspecified fall, initial encounter: Secondary | ICD-10-CM

## 2017-05-24 DIAGNOSIS — Y999 Unspecified external cause status: Secondary | ICD-10-CM | POA: Diagnosis not present

## 2017-05-24 DIAGNOSIS — R51 Headache: Secondary | ICD-10-CM | POA: Diagnosis not present

## 2017-05-24 DIAGNOSIS — Z95810 Presence of automatic (implantable) cardiac defibrillator: Secondary | ICD-10-CM | POA: Insufficient documentation

## 2017-05-24 DIAGNOSIS — M25461 Effusion, right knee: Secondary | ICD-10-CM | POA: Insufficient documentation

## 2017-05-24 DIAGNOSIS — I499 Cardiac arrhythmia, unspecified: Secondary | ICD-10-CM | POA: Diagnosis not present

## 2017-05-24 DIAGNOSIS — M7989 Other specified soft tissue disorders: Secondary | ICD-10-CM | POA: Diagnosis not present

## 2017-05-24 DIAGNOSIS — S80212A Abrasion, left knee, initial encounter: Secondary | ICD-10-CM | POA: Diagnosis not present

## 2017-05-24 DIAGNOSIS — S59901A Unspecified injury of right elbow, initial encounter: Secondary | ICD-10-CM | POA: Diagnosis not present

## 2017-05-24 DIAGNOSIS — M79644 Pain in right finger(s): Secondary | ICD-10-CM | POA: Diagnosis not present

## 2017-05-24 LAB — PROTIME-INR
INR: 2.39
Prothrombin Time: 25.9 seconds — ABNORMAL HIGH (ref 11.4–15.2)

## 2017-05-24 LAB — APTT: aPTT: 31 seconds (ref 24–36)

## 2017-05-24 MED ORDER — BACITRACIN 500 UNIT/GM EX OINT
TOPICAL_OINTMENT | Freq: Once | CUTANEOUS | Status: AC
  Administered 2017-05-24: 16:00:00 via TOPICAL
  Filled 2017-05-24: qty 28.4

## 2017-05-24 MED ORDER — TETANUS-DIPHTH-ACELL PERTUSSIS 5-2.5-18.5 LF-MCG/0.5 IM SUSP
0.5000 mL | Freq: Once | INTRAMUSCULAR | Status: AC
  Administered 2017-05-24: 0.5 mL via INTRAMUSCULAR
  Filled 2017-05-24: qty 0.5

## 2017-05-24 MED ORDER — ACETAMINOPHEN 325 MG PO TABS
325.0000 mg | ORAL_TABLET | Freq: Once | ORAL | Status: AC
  Administered 2017-05-24: 325 mg via ORAL
  Filled 2017-05-24: qty 1

## 2017-05-24 NOTE — ED Provider Notes (Signed)
[facility] Emergency Department Provider Note  ____________________________________________  Time seen: Approximately 2:58 PM  I have reviewed the triage vital signs and the nursing notes.   HISTORY  Chief Complaint Fall    HPI [name] is a 84 y.o. male Coumadin for a flutter presenting with a fall and resulting head injury, neck discomfort, right elbow, right fifth finger and right knee pain.  The patient reports that right before 11 AM this morning, he tripped over a concrete parking block and fell forward, landing on his face and right neck.  He did not lose consciousness.  He was able to stand up on his own.  Initially, he had headache, which has now resolved.  He does report some right lateral neck pain without any midline pain; no associated numbness tingling or weakness.  The patient has not had any nausea or vomiting, chest pain, palpitations, lightheadedness or syncope.  In addition, he has superficial abrasions to the left forehead and eyebrow, right elbow, right fifth digit, and the bilateral knees.  Past Medical History:  Diagnosis Date  . AICD (automatic cardioverter/defibrillator) present    s/p gen change 04/2015 w/ MDT [name] Crt-D  DTBA1D1, Serial number [id number]  . Allergic rhinitis    [name]  . Anemia   . Anxiety   . Arthritis   . Atrial flutter ([place])    A.  Status post cardioversion; B.  Tikosyn therapy - failed, remains in aflutter  . Bilateral pneumonia 11/02/2013   treated with levaquin  . BPH (benign prostatic hyperplasia)   . 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 Lucky Cowboy)  . Chronic sinusitis   . Chronic systolic congestive heart failure ([place])   . COPD (chronic obstructive pulmonary disease) ([place])    spirometry 2015 - no obstruction, + mild restrictive lung disease  . Extrinsic asthma    [name]  . GERD (gastroesophageal reflux disease) 2010   h/o esophageal stricture with dilation, LA grade  C reflux esophagitis by EGD 2010  . Goiter   . History of diverticulitis of colon   . History of GI bleed    Secondary to hemorrhoids  . IBS (irritable bowel syndrome)   . LBBB (left bundle branch block)   . Multiple pulmonary nodules 06/2013   RUL ([facility]. Adam Phenix at [facility]) - ?vasculitis as of last CT at [facility]  . NICM (nonischemic cardiomyopathy) [facility])    Cardiac catheterization March 2006 without coronary disease; EF 25% 2018 Jan  . [facility])     Patient Active Problem List   Diagnosis Date Noted  . Pulmonary HTN ([place]) 03/25/2017  . Aortic valve regurgitation 03/25/2017  . Other fatigue 12/04/2016  . Nondisplaced fracture of greater trochanter of left femur ([place]) 10/30/2016  . Closed fracture of left hip ([place]) 10/02/2016  . Skin tear of forearm without complication, right, initial encounter 07/12/2016  . Status post reverse total arthroplasty of right shoulder 01/17/2016  . Complete tear of right rotator cuff 12/10/2015  . Acute sinusitis 03/18/2015  . Complete tear of left rotator cuff 05/07/2014  . Warfarin anticoagulation 05/07/2014  . Advanced care planning/counseling discussion 03/16/2014  . Constipation 09/09/2013  . Hyperglycemia 09/09/2013  . Medicare annual wellness visit, subsequent 04/22/2012  . 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 Left sided sciatica 06/11/2011  . Thrombocytopenia ([place]) 02/25/2011  . B12 deficiency 02/22/2011  . Sinus congestion 09/25/2010  . Long term current use of anticoagulant  06/27/2010  . ESOPHAGEAL STRICTURE 02/16/2010  . CHEST PAIN 01/03/2010  . Atrial flutter ([place]) 12/20/2008  . Anxiety 12/08/2008  . Secondary cardiomyopathy ([place]) 09/13/2008  . LBBB 09/13/2008  . Automatic implantable cardioverter-defibrillator in situ 09/13/2008  . PORPHYRIA 12/16/2006  . Chronic systolic heart failure ([place]) 12/16/2006  . ALLERGIC RHINITIS 12/16/2006  . GERD 12/16/2006  . BENIGN PROSTATIC HYPERTROPHY 12/16/2006  . IBS  12/08/1997    Past Surgical History:  Procedure Laterality Date  . BACK SURGERY  1997   bulging disks  . BiV-AICD implant     [employer]  . CARDIAC CATHETERIZATION  06/22/04   Severe, nonischemic cardiomyopathy,EF 25-30%  . CARDIOVERSION  02/22/09   AFlutter-([facility])  . CHOLECYSTECTOMY    . COLONOSCOPY  10/99   Divertic, splenic, hepatic fleure only  . COLONOSCOPY  10/21/08   aborted-divertics, int hemms (Dr. Vira Agar)  . CORONARY ANGIOPLASTY    . EP IMPLANTABLE DEVICE N/A 04/25/2015   Procedure: BIV ICD Generator Changeout;  Surgeon: Evans Lance, MD;  Location: [place] CV LAB;  Service: Cardiovascular;  Laterality: N/A;  . ESOPHAGEAL DILATION  02/18/98  . ESOPHAGOGASTRODUODENOSCOPY  01/1998   stricture, sliding HH, GERD  . ESOPHAGOGASTRODUODENOSCOPY  04/08/01   stricture, gastritis, HH, GERD-no dilation(Dr. Henrene Pastor)  . ESOPHAGOGASTRODUODENOSCOPY  12/22/04   stricture; gastritis; duodenitis, GERD  . ESOPHAGOGASTRODUODENOSCOPY  10/21/08   Reflux esophagitis; Erythem. Duod. (Dr. Vira Agar)  . ESOPHAGOGASTRODUODENOSCOPY  08/2013   erythema gastric fundus - minimal gastritis, HH ([place])  . ESOPHAGOGASTRODUODENOSCOPY  08/2016   dysphagia - mod schatzki rin, dilated Tiffany Kocher)  . ESOPHAGOGASTRODUODENOSCOPY (EGD) WITH PROPOFOL N/A 08/24/2016   mod schatzki ring dilated, duodenal deformity Vira Agar, Gavin Pound, MD)  . goiter removal    . HERNIA REPAIR    . HERNIA REPAIR  01/30/02   Dr. Hassell Done  . INSERT / REPLACE / REMOVE PACEMAKER    . NOSE SURGERY  1971  . PENILE PROSTHESIS IMPLANT  2007   [place]  . PFT  10/2013   FVC 61%, FEV1 63%, ratio 0.76  . Pulmonary eval  04/2002   ([facility]) Chronic congestive symptoms  . REVERSE SHOULDER ARTHROPLASTY Right 01/17/2016   Procedure: REVERSE SHOULDER ARTHROPLASTY;  Surgeon: Corky Mull, MD;  Location: [facility] ORS;  Service: Orthopedics;  Laterality: Right;  . REVERSE TOTAL SHOULDER ARTHROPLASTY Right 01/2016   Poggi  . US ECHOCARDIOGRAPHY  07/13/04   EF  25-30%, Mod LVH; LA severe dilation; Mild AR; IRTR  . US ECHOCARDIOGRAPHY  11/27/06   hypokinesis posterior wall, EF 35%; mild AR    Current Outpatient Rx  . Order #: [id number] Class: Historical Med  . Order #: [id number] Class: Phone In  . Order #: [id number] Class: Historical Med  . Order #: [id number] Class: Historical Med  . Order #: [id number] Class: Normal  . Order #: [id number] Class: Historical Med  . Order #: [id number] Class: Normal  . Order #: [id number] Class: Historical Med  . Order #: [id number] Class: Normal  . Order #: [id number] Class: Historical Med  . Order #: [id number] Class: Historical Med  . Order #: [id number] Class: Print  . Order #: [id number] Class: Historical Med  . Order #: [id number] Class: Normal  . Order #: [id number] Class: Historical Med  . Order #: [id number] Class: Normal    Allergies Carafate [sucralfate]; Clarithromycin; Famotidine; Levaquin [levofloxacin]; Omeprazole; Oxytetracycline; Penicillins; Proton pump inhibitors; Ranitidine; and Doxycycline  Family History  Problem Relation Age of Onset  . Coronary artery disease Father   . Hypertension Father   . Heart failure Mother   . Dysphagia Sister   .  Breast cancer Unknown   . Ovarian cancer Unknown   . Uterine cancer Unknown   . Other Sister        stomach problems  . Other Sister        stomach problems  . Other Sister        stomach problems  . Alcohol abuse Maternal Uncle     Social History Social History   Tobacco Use  . Smoking status: Never Smoker  . Smokeless tobacco: Never Used  Substance Use Topics  . Alcohol use: No  . Drug use: No    Review of Systems Constitutional: No fever/chills.  No lightheadedness or syncope.  Positive mechanical fall without loss of consciousness. Eyes: No visual changes.  No blurred or double vision. ENT: No sore throat. No congestion or rhinorrhea. Cardiovascular: Denies chest pain. Denies palpitations. Respiratory: Denies shortness of breath.  No  cough. Gastrointestinal: No abdominal pain.  No nausea, no vomiting.  No diarrhea.  No constipation. Genitourinary: Negative for dysuria. Musculoskeletal: Negative for back pain.  Positive for right lateral neck pain.  Positive for pain in the right greater than left knee, right fifth digit, right elbow. Skin: Negative for rash.  Positive for multiple abrasions and skin tears. Neurological: Positive for headache now resolved.. No focal numbness, tingling or weakness.  No difficulty walking.    ____________________________________________   PHYSICAL EXAM:  VITAL SIGNS: ED Triage Vitals  Enc Vitals Group     BP 05/24/17 1107 126/68     Pulse Rate 05/24/17 1107 62     Resp 05/24/17 1107 20     Temp 05/24/17 1107 97.7 F (36.5 C)     Temp Source 05/24/17 1107 Oral     SpO2 05/24/17 1107 99 %     Weight 05/24/17 1108 142 lb (64.4 kg)     Height 05/24/17 1108 5\' 7"  (1.702 m)     Head Circumference --      Peak Flow --      Pain Score 05/24/17 1115 5     Pain Loc --      Pain Edu? --      Excl. in [place]? --     Constitutional: The patient is alert and oriented and answering questions appropriately.  His speech is clear.  GCS is 15. Eyes: Conjunctivae are normal.  EOMI. No scleral icterus. Head: The patient has a irregular 4 x 5 cm abrasion over the left forehead, including the left eyebrow without any deep lacerations.  He has no battle sign. Nose: No congestion/rhinnorhea.  No swelling over the nose.  No septal hematoma. Mouth/Throat: Mucous membranes are moist.  No dental injury or malocclusion.  No evidence of injury to the lips or mouth. Neck: No stridor.  Supple.  No midline C-spine tenderness to palpation, step-offs or deformities.  The patient does have reproducible right lateral neck discomfort with tenderness on palpation or pain with lateral movement.  No overlying skin changes including ecchymosis, abrasion or significant swelling. Cardiovascular: Normal rate, regular rhythm.  No murmurs, rubs or gallops.  Respiratory: Normal respiratory effort.  No accessory muscle use or retractions. Lungs CTAB.  No wheezes, rales or ronchi. Gastrointestinal: Soft, nontender and nondistended.  No guarding or rebound.  No peritoneal signs. Musculoskeletal: Pelvis is stable.  The patient has full range of motion of the bilateral hips, knees and ankles without pain.  He does have a superficial abrasion with some bruising that is approximately 2 x 2 cm over the right knee  without a significant effusion; he has a 1 x 1 cm bruise with superficial abrasion over the left knee.  The patient has full range of motion without pain of the bilateral shoulders and wrists, left elbow.  He has minimal discomfort with range of motion of the right elbow.  He does have a skin tear over the right elbow that is approximately 2 x 2 cm.  He also has a skin tear over the center part of the fifth digit with full range of motion of all the joints in that finger. Neurologic:  A&Ox3.  Speech is clear.  Face and smile are symmetric.  EOMI.  Moves all extremities well. Skin: See above for skin exam. Psychiatric: Mood and affect are normal. Speech and behavior are normal.  Normal judgement.  ____________________________________________   LABS (all labs ordered are listed, but only abnormal results are displayed)  Labs Reviewed  PROTIME-INR - Abnormal; Notable for the following components:      Result Value   Prothrombin Time 25.9 (*)    All other components within normal limits  APTT   ____________________________________________  EKG  ED ECG REPORT I, Eula Listen, the attending physician, personally viewed and interpreted this ECG.   Date: 05/24/2017  EKG Time: 1523  Rate: 60  Rhythm: PACED  Axis: leftward  Intervals:none  ST&T Change: No STEMI ____________________________________________  RADIOLOGY  Dg Elbow Complete Right  Result Date: 05/24/2017 CLINICAL DATA:  Fall with posterior  elbow pain and swelling. EXAM: RIGHT ELBOW - COMPLETE 3+ VIEW COMPARISON:  None. FINDINGS: Mild pre olecranon soft tissue swelling. Enthesophyte at the triceps insertion. No acute fracture or dislocation. No joint effusion. IMPRESSION: No acute osseous abnormality. Electronically Signed   By: Abigail Miyamoto M.D.   On: 05/24/2017 15:38   Ct Head Wo Contrast  Result Date: 05/24/2017 CLINICAL DATA:  84 year old male with history of fall today with injury to the head. Multiple lacerations. On anticoagulation. Headache. Neck pain. EXAM: CT HEAD WITHOUT CONTRAST CT CERVICAL SPINE WITHOUT CONTRAST TECHNIQUE: Multidetector CT imaging of the head and cervical spine was performed following the standard protocol without intravenous contrast. Multiplanar CT image reconstructions of the cervical spine were also generated. COMPARISON:  Head CT 08/18/2013. FINDINGS: CT HEAD FINDINGS Brain: Mild cerebral atrophy. Patchy and confluent areas of decreased attenuation are noted throughout the deep and periventricular white matter of the cerebral hemispheres bilaterally, compatible with chronic microvascular ischemic disease. No evidence of acute infarction, hemorrhage, hydrocephalus, extra-axial collection or mass lesion/mass effect. Vascular: No hyperdense vessel or unexpected calcification. Skull: Normal. Negative for fracture or focal lesion. Sinuses/Orbits: Minimal mucosal thickening in the sphenoid sinuses. No acute finding. Other: High attenuation soft tissue swelling in the left frontal scalp, compatible with a small hematoma. Small amount of gas in the soft tissues implies laceration. CT CERVICAL SPINE FINDINGS Alignment: Normal. Skull base and vertebrae: No acute fracture. No primary bone lesion or focal pathologic process. Soft tissues and spinal canal: No prevertebral fluid or swelling. No visible canal hematoma. Disc levels: Severe multilevel degenerative disc disease, most pronounced at C2-C3, C5-C6 and C6-C7. Moderate  multilevel facet arthropathy. Upper chest: Unremarkable. Other: None. Brain: No evidence of acute infarction, hemorrhage, hydrocephalus, extra-axial collection or mass lesion/mass effect. IMPRESSION: 1. Small left frontal scalp laceration/hematoma. No underlying displaced skull fracture or signs of significant acute traumatic injury to the brain. 2. No evidence of significant acute traumatic injury to the cervical spine. 3. Mild cerebral atrophy with chronic microvascular ischemic changes in the cerebral  white matter, as above. 4. Severe multilevel degenerative disc disease and cervical spondylosis, as above. Electronically Signed   By: Vinnie Langton M.D.   On: 05/24/2017 12:14   Ct Cervical Spine Wo Contrast  Result Date: 05/24/2017 CLINICAL DATA:  84 year old male with history of fall today with injury to the head. Multiple lacerations. On anticoagulation. Headache. Neck pain. EXAM: CT HEAD WITHOUT CONTRAST CT CERVICAL SPINE WITHOUT CONTRAST TECHNIQUE: Multidetector CT imaging of the head and cervical spine was performed following the standard protocol without intravenous contrast. Multiplanar CT image reconstructions of the cervical spine were also generated. COMPARISON:  Head CT 08/18/2013. FINDINGS: CT HEAD FINDINGS Brain: Mild cerebral atrophy. Patchy and confluent areas of decreased attenuation are noted throughout the deep and periventricular white matter of the cerebral hemispheres bilaterally, compatible with chronic microvascular ischemic disease. No evidence of acute infarction, hemorrhage, hydrocephalus, extra-axial collection or mass lesion/mass effect. Vascular: No hyperdense vessel or unexpected calcification. Skull: Normal. Negative for fracture or focal lesion. Sinuses/Orbits: Minimal mucosal thickening in the sphenoid sinuses. No acute finding. Other: High attenuation soft tissue swelling in the left frontal scalp, compatible with a small hematoma. Small amount of gas in the soft tissues  implies laceration. CT CERVICAL SPINE FINDINGS Alignment: Normal. Skull base and vertebrae: No acute fracture. No primary bone lesion or focal pathologic process. Soft tissues and spinal canal: No prevertebral fluid or swelling. No visible canal hematoma. Disc levels: Severe multilevel degenerative disc disease, most pronounced at C2-C3, C5-C6 and C6-C7. Moderate multilevel facet arthropathy. Upper chest: Unremarkable. Other: None. Brain: No evidence of acute infarction, hemorrhage, hydrocephalus, extra-axial collection or mass lesion/mass effect. IMPRESSION: 1. Small left frontal scalp laceration/hematoma. No underlying displaced skull fracture or signs of significant acute traumatic injury to the brain. 2. No evidence of significant acute traumatic injury to the cervical spine. 3. Mild cerebral atrophy with chronic microvascular ischemic changes in the cerebral white matter, as above. 4. Severe multilevel degenerative disc disease and cervical spondylosis, as above. Electronically Signed   By: Vinnie Langton M.D.   On: 05/24/2017 12:14   Dg Knee Complete 4 Views Right  Result Date: 05/24/2017 CLINICAL DATA:  Fall.  Right knee injury. EXAM: RIGHT KNEE - COMPLETE 4+ VIEW COMPARISON:  None. FINDINGS: Prepatellar soft tissue swelling. No fracture or dislocation. Trace suprapatellar right knee joint effusion. No suspicious focal osseous lesion. No significant degenerative arthropathy. No radiopaque foreign body. IMPRESSION: Prepatellar soft tissue swelling. Trace suprapatellar right knee joint effusion. No fracture or malalignment. Electronically Signed   By: Ilona Sorrel M.D.   On: 05/24/2017 15:35   Dg Finger Little Right  Result Date: 05/24/2017 CLINICAL DATA:  Fall today.  Right fifth finger pain. EXAM: RIGHT LITTLE FINGER 2+V COMPARISON:  None. FINDINGS: No fracture or dislocation. Mild osteoarthritis in the proximal and distal interphalangeal joints of the right fifth finger. No suspicious focal osseous  lesions. No radiopaque foreign bodies. IMPRESSION: No fracture or dislocation.  No radiopaque foreign body. Electronically Signed   By: Ilona Sorrel M.D.   On: 05/24/2017 15:34    ____________________________________________   PROCEDURES  Procedure(s) performed: None  Procedures  Critical Care performed: No ____________________________________________   INITIAL IMPRESSION / ASSESSMENT AND PLAN / ED COURSE  Pertinent labs & imaging results that were available during my care of the patient were reviewed by me and considered in my medical decision making (see chart for details).  84 y.o. male on Coumadin presenting with a mechanical fall, resulting abrasion to the left forehead,  right elbow, right pinky, left knee and right knee.  The patient had CT of the head and C-spine which did not show any acute injury.  We will get additional plain x-ray imaging of his other injured areas.  He will receive a Tdap here as he is unsure when his last tetanus booster was.  The patient will receive Tylenol for his neck pain.  There is no history that would be suggestive of a syncopal episode; will do screening EKG but no laboratory studies are indicated at this time.  Plan reevaluation for final disposition.  ----------------------------------------- 3:50 PM on 05/24/2017 -----------------------------------------  Patient's EKG is reassuring.  The remainder of his plain x-rays is negative for any acute injury.  The patient's wounds have been washed thoroughly and treated with bacitracin and a dressing.  At this time, the patient is safe for discharge home.  I have discussed return precautions as well as follow-up instructions with him and his family.  ____________________________________________  FINAL CLINICAL IMPRESSION(S) / ED DIAGNOSES  Final diagnoses:  Neck pain on right side  Facial contusion, initial encounter  Fall, initial encounter  Effusion of bursa of right knee  Multiple skin tears          NEW MEDICATIONS STARTED DURING THIS VISIT:  New Prescriptions   No medications on file      Eula Listen, MD 05/24/17 1552

## 2017-05-24 NOTE — ED Notes (Signed)
Pt's care discussed with Dr. Cinda Quest, see orders for details.

## 2017-05-24 NOTE — ED Triage Notes (Signed)
Pt presents to ED via POV with c/o mechanical fall earlier today. Pt denies LOC. Pt states he takes coumadin at this time. Pt presents with abrasions to forehead above L eyebrow and various abrasions to his arms and fingers. Pt states immediately ambulatory after the fall. Pt is alert and oriented at this time.

## 2017-05-24 NOTE — ED Notes (Signed)
Tripped on parking stone, no LOC or vomiting.  Mental status WNL.

## 2017-05-24 NOTE — Discharge Instructions (Signed)
You may continue to use bacitracin or any triple antibiotic cream and a thick coat 3 times daily on your abrasions.  Please monitor all of your abrasions for any signs of infection and seek immediate medical attention if you are seeing any pus drainage, redness, swelling or increasing pain.  Please return to the emergency department if you develop severe pain, numbness tingling or weakness, vomiting, changes in vision speech or mental status, or for any other symptoms concerning to you.

## 2017-05-28 ENCOUNTER — Other Ambulatory Visit: Payer: Self-pay | Admitting: Physician Assistant

## 2017-05-28 ENCOUNTER — Ambulatory Visit (INDEPENDENT_AMBULATORY_CARE_PROVIDER_SITE_OTHER): Payer: Medicare Other | Admitting: Family Medicine

## 2017-05-28 ENCOUNTER — Other Ambulatory Visit: Payer: Self-pay | Admitting: Cardiology

## 2017-05-28 ENCOUNTER — Encounter: Payer: Self-pay | Admitting: Family Medicine

## 2017-05-28 VITALS — BP 116/64 | HR 67 | Temp 98.3°F | Wt 143.5 lb

## 2017-05-28 DIAGNOSIS — M25461 Effusion, right knee: Secondary | ICD-10-CM | POA: Diagnosis not present

## 2017-05-28 DIAGNOSIS — S0510XA Contusion of eyeball and orbital tissues, unspecified eye, initial encounter: Secondary | ICD-10-CM | POA: Diagnosis not present

## 2017-05-28 DIAGNOSIS — M542 Cervicalgia: Secondary | ICD-10-CM

## 2017-05-28 DIAGNOSIS — W19XXXA Unspecified fall, initial encounter: Secondary | ICD-10-CM

## 2017-05-28 DIAGNOSIS — S0083XA Contusion of other part of head, initial encounter: Secondary | ICD-10-CM

## 2017-05-28 DIAGNOSIS — R0981 Nasal congestion: Secondary | ICD-10-CM | POA: Diagnosis not present

## 2017-05-28 HISTORY — DX: Unspecified fall, initial encounter: W19.XXXA

## 2017-05-28 NOTE — Assessment & Plan Note (Signed)
Chronic, s/p multiple rounds of antibiotics and steroids. Latest prescribed what sounds like topical antibiotic solution with levaquin for nose by ENT - they are waiting to receive this. I rec try this and f/u with ENT as directed.

## 2017-05-28 NOTE — Progress Notes (Signed)
BP 116/64 (BP Location: Left Arm, Patient Position: Sitting, Cuff Size: Normal)   Pulse 67   Temp 98.3 F (36.8 C) (Oral)   Wt 143 lb 8 oz (65.1 kg)   SpO2 96%   BMI 22.48 kg/m    CC: [facility] f/u visit  Subjective:    Patient ID: [name], male    DOB: April 12, 1931, 84 y.o.   MRN: [id number]  HPI: JUWANN SHERK is a 84 y.o. male presenting on 05/28/2017 for Follow-up (Seen at [facility] on 05/24/17 due to a fall. ) and Sinus Problem (Facial pressure/pain, nasal congestion, sneezing and runny nose. Started wks ago, before fall. Has tried OTC sinus meds.)   Seen at ER Friday 05/24/2017 after suffered fall - tripped over concrete parking block - mechanical fall. Records reviewed. Injured head, R neck, R elbow and pinky, R knee. No LOC. Elbow xray, knee xray, finger xray, and head and cervical CT without acute findings (known severe DDD and cervical spondylosis). On coumadin. He did receive Tdap. No dizziness or premonitory sxs.   Ongoing sinus congestion for weeks. Prednisone helped but persistent symptoms. Last took amoxicillin course 2-3 wks ago. Has been seen at [facility] (Rx ceftin and prednisone), and by Dr Pryor Ochoa ENT (amox and prednisone). He is using mucinex and flonase. Planning on treating with some levaquin nasal spray - it should arrive today.   Relevant past medical, surgical, family and social history reviewed and updated as indicated. Interim medical history since our last visit reviewed. Allergies and medications reviewed and updated. Outpatient Medications Prior to Visit  Medication Sig Dispense Refill  . acetaminophen (TYLENOL) 500 MG tablet Take 250 mg by mouth daily as needed. Pain    . ALPRAZolam (XANAX) 0.25 MG tablet TAKE 1 TABLET BY MOUTH AT BEDTIME 60 tablet 0  . alum & mag hydroxide-simeth (MAALOX/MYLANTA) 200-200-20 MG/5ML suspension Take 15 mLs by mouth daily as needed for indigestion or heartburn.    . budesonide-formoterol (SYMBICORT) 160-4.5 MCG/ACT inhaler  Inhale 2 puffs into the lungs at bedtime.    . carvedilol (COREG) 6.25 MG tablet Take 1.5 tablets (9.375 mg total) by mouth 2 (two) times daily with a meal. 270 tablet 3  . fluticasone (FLONASE) 50 MCG/ACT nasal spray Place 1 spray into both nostrils daily.    . furosemide (LASIX) 20 MG tablet Take 1 tablet (20 mg total) by mouth daily. 90 tablet 2  . guaiFENesin 200 MG tablet Take 400 mg by mouth every 4 (four) hours as needed for cough or to loosen phlegm.    . levalbuterol (XOPENEX HFA) 45 MCG/ACT inhaler Inhale 1-2 puffs into the lungs every 6 (six) hours as needed for wheezing or shortness of breath. 1 Inhaler 1  . Loratadine 10 MG CAPS Take 1 capsule by mouth daily as needed (allergies).     . montelukast (SINGULAIR) 10 MG tablet Take 10 mg by mouth daily as needed (BREATHING).     . polyethylene glycol (MIRALAX / GLYCOLAX) packet Take 17 g by mouth daily as needed (constipation).     . vitamin B-12 (CYANOCOBALAMIN) 500 MCG tablet Take 500 mg by mouth daily.    Marland Kitchen warfarin (COUMADIN) 5 MG tablet TAKE AS DIRECTED  BY  ANTICOAGULATION  CLINIC 135 tablet 3  . ondansetron (ZOFRAN-ODT) 4 MG disintegrating tablet Take 1 tablet (4 mg total) by mouth every 8 (eight) hours as needed for nausea or vomiting. 20 tablet 0  . predniSONE (DELTASONE) 20 MG tablet Take 1 tablet (20  mg total) by mouth daily with breakfast. 5 tablet 0   No facility-administered medications prior to visit.      Per HPI unless specifically indicated in ROS section below Review of Systems     Objective:    BP 116/64 (BP Location: Left Arm, Patient Position: Sitting, Cuff Size: Normal)   Pulse 67   Temp 98.3 F (36.8 C) (Oral)   Wt 143 lb 8 oz (65.1 kg)   SpO2 96%   BMI 22.48 kg/m   Wt Readings from Last 3 Encounters:  05/28/17 143 lb 8 oz (65.1 kg)  05/24/17 142 lb (64.4 kg)  04/15/17 143 lb (64.9 kg)    Physical Exam  Constitutional: He appears well-developed and well-nourished. No distress.  HENT:    Mouth/Throat: Oropharynx is clear and moist. No oropharyngeal exudate.  Abrasion L forehead above eye Bruising below eyes bilaterally  Eyes: Conjunctivae are normal. Pupils are equal, round, and reactive to light.  Cardiovascular: Normal rate, regular rhythm, normal heart sounds and intact distal pulses.  No murmur heard. Pulmonary/Chest: Effort normal and breath sounds normal. No respiratory distress. He has no wheezes. He has no rales.  Musculoskeletal: He exhibits no edema.  FROM bilateral knees and elbows  Skin: Skin is warm and dry. No rash noted.  Abrasions on R elbow and pinky re-dressed  Nursing note and vitals reviewed.  Results for orders placed or performed during the hospital encounter of 05/24/17  APTT  Result Value Ref Range   aPTT 31 24 - 36 seconds  Protime-INR  Result Value Ref Range   Prothrombin Time 25.9 (H) 11.4 - 15.2 seconds   INR 2.39       Assessment & Plan:  Over 25 minutes were spent face-to-face with the patient during this encounter and >50% of that time was spent on counseling and coordination of care  Problem List Items Addressed This Visit    Fall with injury - Primary    ER records reviewed. Healing well. Wounds of R arm/finger dressed. Anticipated course of recovery reviewed. Encouraged regular use of cane for stability. Update if not healing as expected.       Sinus congestion    Chronic, s/p multiple rounds of antibiotics and steroids. Latest prescribed what sounds like topical antibiotic solution with levaquin for nose by ENT - they are waiting to receive this. I rec try this and f/u with ENT as directed.           No orders of the defined types were placed in this encounter.  No orders of the defined types were placed in this encounter.   Follow up plan: Return if symptoms worsen or fail to improve.  Ria Bush, MD

## 2017-05-28 NOTE — Patient Instructions (Addendum)
Try the nasal antibiotic by Dr Pryor Ochoa.  Continue nose ease.  Good to see you today, call us with questions.  Continue dressing changes daily. Let us know if not healing over time.

## 2017-05-28 NOTE — Telephone Encounter (Signed)
Rx(s) sent to pharmacy electronically.  

## 2017-05-28 NOTE — Assessment & Plan Note (Signed)
ER records reviewed. Healing well. Wounds of R arm/finger dressed. Anticipated course of recovery reviewed. Encouraged regular use of cane for stability. Update if not healing as expected.

## 2017-05-29 ENCOUNTER — Telehealth: Payer: Self-pay | Admitting: Internal Medicine

## 2017-05-29 NOTE — Telephone Encounter (Signed)
These medications should be ok with his coumadin. Mandi, RN is aware that these should not influence INR or warfarin management based on mode of administration.

## 2017-05-29 NOTE — Telephone Encounter (Signed)
Spoke w/ pt.  Advised him of Kelley's recommendation.  He is appreciative of the call and will keep his INR check on Monday.

## 2017-05-29 NOTE — Telephone Encounter (Signed)
Pt calling stating he got some antibiotics  He is on   Levofloxacin 100 mg  Budesonide 6 mg  He states you mix these two and squirt it up his nose  He would like a call back since he is seeing [place] for his coumadin   Please advise

## 2017-06-03 ENCOUNTER — Ambulatory Visit (INDEPENDENT_AMBULATORY_CARE_PROVIDER_SITE_OTHER): Payer: Medicare Other

## 2017-06-03 DIAGNOSIS — I4892 Unspecified atrial flutter: Secondary | ICD-10-CM

## 2017-06-03 DIAGNOSIS — I483 Typical atrial flutter: Secondary | ICD-10-CM

## 2017-06-03 DIAGNOSIS — Z7901 Long term (current) use of anticoagulants: Secondary | ICD-10-CM

## 2017-06-03 DIAGNOSIS — Z5181 Encounter for therapeutic drug level monitoring: Secondary | ICD-10-CM | POA: Diagnosis not present

## 2017-06-03 LAB — POCT INR: INR: 3.2

## 2017-06-03 NOTE — Patient Instructions (Signed)
Please skip coumadin today, then resume previous dosage of 5 mg daily except 2.5 mg on Mondays. Please be consistent with your greens intake. Recheck in 2 weeks.

## 2017-06-10 ENCOUNTER — Telehealth: Payer: Self-pay | Admitting: Cardiology

## 2017-06-10 NOTE — Telephone Encounter (Signed)
Close encounter 

## 2017-06-11 DIAGNOSIS — J320 Chronic maxillary sinusitis: Secondary | ICD-10-CM | POA: Diagnosis not present

## 2017-06-11 DIAGNOSIS — K219 Gastro-esophageal reflux disease without esophagitis: Secondary | ICD-10-CM | POA: Diagnosis not present

## 2017-06-13 NOTE — Progress Notes (Signed)
HPI [name] presents for follow up of his cardiomyopathy and atrial flutter.  The last echo that I ordered in Jan was 20 - 25%.  Since then he fell.  He did not have syncope.  He hit his eye.  I did review these records.  He has no acute findings on his head CT.  Labs are unremarkable including a creatinine of 1.04.  He has had increasing shortness of breath and is now short of breath walking a short distance on level ground.  His legs feel weak and have just diffuse achiness.  He is not having any PND or orthopnea.  He is not noticing any palpitations, presyncope or syncope.  His weights have been stable.  He has had some mildly increased lower extremity swelling.  He does watch his salt intake and sometimes drinks a little extra fluid.   Allergies  Allergen Reactions  . Clarithromycin Nausea Only  . Famotidine Other (See Comments)    ABD. PAIN  . Levaquin [Levofloxacin] Other (See Comments)    Stomach pain, has to eat a lot of food  . Omeprazole Diarrhea  . Oxytetracycline Other (See Comments)    BUMPS  . Penicillins Swelling      . Proton Pump Inhibitors Other (See Comments)    GI upset, diarrhea, gas, bloating  . Ranitidine Diarrhea  . Doxycycline Rash    Current Outpatient Medications  Medication Sig Dispense Refill  . acetaminophen (TYLENOL) 500 MG tablet Take 250 mg by mouth daily as needed. Pain    . ALPRAZolam (XANAX) 0.25 MG tablet TAKE 1 TABLET BY MOUTH AT BEDTIME 60 tablet 0  . alum & mag hydroxide-simeth (MAALOX/MYLANTA) 200-200-20 MG/5ML suspension Take 15 mLs by mouth daily as needed for indigestion or heartburn.    . budesonide-formoterol (SYMBICORT) 160-4.5 MCG/ACT inhaler Inhale 2 puffs into the lungs at bedtime.    . carvedilol (COREG) 6.25 MG tablet Take 1 and 1/2 tablets by mouth twice daily 270 tablet 3  . fluticasone (FLONASE) 50 MCG/ACT nasal spray Place 1 spray into both nostrils daily.    . furosemide (LASIX) 20 MG tablet TAKE 1 TABLET  EVERY DAY 90 tablet 3  . guaiFENesin 200 MG tablet Take 400 mg by mouth every 4 (four) hours as needed for cough or to loosen phlegm.    . levalbuterol (XOPENEX HFA) 45 MCG/ACT inhaler Inhale 1-2 puffs into the lungs every 6 (six) hours as needed for wheezing or shortness of breath. 1 Inhaler 1  . Loratadine 10 MG CAPS Take 1 capsule by mouth daily as needed (allergies).     . montelukast (SINGULAIR) 10 MG tablet Take 10 mg by mouth daily as needed (BREATHING).     . polyethylene glycol (MIRALAX / GLYCOLAX) packet Take 17 g by mouth daily as needed (constipation).     . vitamin B-12 (CYANOCOBALAMIN) 500 MCG tablet Take 500 mg by mouth daily.    Marland Kitchen warfarin (COUMADIN) 5 MG tablet TAKE AS DIRECTED  BY  ANTICOAGULATION  CLINIC 135 tablet 3   No current facility-administered medications for this visit.     Past Medical History:  Diagnosis Date  . AICD (automatic cardioverter/defibrillator) present    s/p gen change 04/2015 w/ MDT [name] Crt-D  DTBA1D1, Serial number [id number]  . Allergic rhinitis    [name]  . Anemia   . Anxiety   . Arthritis   . Atrial flutter ([place])    A.  Status post cardioversion; B.  Tikosyn therapy - failed, remains in aflutter  . Bilateral pneumonia 11/02/2013   treated with levaquin  . BPH (benign prostatic hyperplasia)   . 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 Lucky Cowboy)  . Chronic sinusitis   . Chronic systolic congestive heart failure ([place])   . COPD (chronic obstructive pulmonary disease) ([place])    spirometry 2015 - no obstruction, + mild restrictive lung disease  . Extrinsic asthma    [name]  . GERD (gastroesophageal reflux disease) 2010   h/o esophageal stricture with dilation, LA grade C reflux esophagitis by EGD 2010  . Goiter   . History of diverticulitis of colon   . History of GI bleed    Secondary to hemorrhoids  . IBS (irritable bowel syndrome)   . LBBB (left bundle branch block)   . Multiple pulmonary nodules 06/2013   RUL  ([facility]. Adam Phenix at [facility]) - ?vasculitis as of last CT at [facility]  . NICM (nonischemic cardiomyopathy) [facility])    Cardiac catheterization March 2006 without coronary disease; EF 25% 2018 Jan  . [place])     Past Surgical History:  Procedure Laterality Date  . BACK SURGERY  1997   bulging disks  . BiV-AICD implant     [employer]  . CARDIAC CATHETERIZATION  06/22/04   Severe, nonischemic cardiomyopathy,EF 25-30%  . CARDIOVERSION  02/22/09   AFlutter-([facility])  . CHOLECYSTECTOMY    . COLONOSCOPY  10/99   Divertic, splenic, hepatic fleure only  . COLONOSCOPY  10/21/08   aborted-divertics, int hemms (Dr. Vira Agar)  . CORONARY ANGIOPLASTY    . EP IMPLANTABLE DEVICE N/A 04/25/2015   Procedure: BIV ICD Generator Changeout;  Surgeon: Evans Lance, MD;  Location: [place] [facility];  Service: Cardiovascular;  Laterality: N/A;  . ESOPHAGEAL DILATION  02/18/98  . ESOPHAGOGASTRODUODENOSCOPY  01/1998   stricture, sliding HH, GERD  . ESOPHAGOGASTRODUODENOSCOPY  04/08/01   stricture, gastritis, HH, GERD-no dilation(Dr. Henrene Pastor)  . ESOPHAGOGASTRODUODENOSCOPY  12/22/04   stricture; gastritis; duodenitis, GERD  . ESOPHAGOGASTRODUODENOSCOPY  10/21/08   Reflux esophagitis; Erythem. Duod. (Dr. Vira Agar)  . ESOPHAGOGASTRODUODENOSCOPY  08/2013   erythema gastric fundus - minimal gastritis, HH ([place])  . ESOPHAGOGASTRODUODENOSCOPY  08/2016   dysphagia - mod schatzki rin, dilated Tiffany Kocher)  . ESOPHAGOGASTRODUODENOSCOPY (EGD) WITH PROPOFOL N/A 08/24/2016   mod schatzki ring dilated, duodenal deformity Vira Agar, Gavin Pound, MD)  . goiter removal    . HERNIA REPAIR    . HERNIA REPAIR  01/30/02   Dr. Hassell Done  . INSERT / REPLACE / REMOVE PACEMAKER    . NOSE SURGERY  1971  . PENILE PROSTHESIS IMPLANT  2007   [place]  . PFT  10/2013   FVC 61%, FEV1 63%, ratio 0.76  . Pulmonary eval  04/2002   ([facility]) Chronic congestive symptoms  . REVERSE SHOULDER ARTHROPLASTY Right 01/17/2016   Procedure: REVERSE SHOULDER  ARTHROPLASTY;  Surgeon: Corky Mull, MD;  Location: [facility] ORS;  Service: Orthopedics;  Laterality: Right;  . REVERSE TOTAL SHOULDER ARTHROPLASTY Right 01/2016   Poggi  . US ECHOCARDIOGRAPHY  07/13/04   EF 25-30%, Mod LVH; LA severe dilation; Mild AR; IRTR  . US ECHOCARDIOGRAPHY  11/27/06   hypokinesis posterior wall, EF 35%; mild AR    ROS:  As stated in the HPI and negative for all other systems.  PHYSICAL EXAM BP 115/72   Pulse 83   Ht 5\' 7"  (1.702 m)   Wt 145 lb 12.8 oz (  66.1 kg)   SpO2 95%   BMI 22.84 kg/m   GENERAL:  Well appearing NECK: Positive  jugular venous distention, waveform within normal limits, carotid upstroke brisk and symmetric, no bruits, no thyromegaly LUNGS:  Clear to auscultation bilaterally CHEST:   well-healed ICD pocket HEART:  PMI not displaced or sustained,S1 and S2 within normal limits, no S3, no S4, no clicks, no rubs, 2 out of 6 apical systolic murmur radiating slightly at the aortic outflow tract, no diastolic murmurs ABD:  Flat, positive bowel sounds normal in frequency in pitch, no bruits, no rebound, no guarding, no midline pulsatile mass, no hepatomegaly, no splenomegaly EXT:  2 plus pulses throughout, no edema, no cyanosis no clubbing   EKG:  NA    06/14/2017  ASSESSMENT AND PLAN  Nonischemic cardiomyopathy -  I think  he has worsening heart failure symptoms.  This may be some low output but there is also some evidence of hypervolemia and so I am going to increase his Lasix to 40 mg daily.  I will check a BNP level.  In about 10 days he will get a basic metabolic profile.  The problem has been that he has not tolerated any med titration.  And then try to get him an appointment with Dr. Lovena Le to see if there is anything that can be done with the timing of his biV device.  I like to have him come back and see me or 1 of my APP is in a couple of weeks.  Atrial flutter -  He has tolerated anticoagulation.   Mr. VERYL WINEMILLER has a CHA2DS2 - VASc  score of 3.  No change in therapy.  Pulmonary HTN - His pulmonary pressures are slightly elevated.  I will manage him conservatively as above and consider whether he needs right heart catheterization in the future to further guide therapy.

## 2017-06-14 ENCOUNTER — Ambulatory Visit (INDEPENDENT_AMBULATORY_CARE_PROVIDER_SITE_OTHER): Payer: Medicare Other | Admitting: Cardiology

## 2017-06-14 ENCOUNTER — Encounter: Payer: Self-pay | Admitting: Cardiology

## 2017-06-14 VITALS — BP 115/72 | HR 83 | Ht 67.0 in | Wt 145.8 lb

## 2017-06-14 DIAGNOSIS — Z79899 Other long term (current) drug therapy: Secondary | ICD-10-CM | POA: Diagnosis not present

## 2017-06-14 DIAGNOSIS — I483 Typical atrial flutter: Secondary | ICD-10-CM

## 2017-06-14 DIAGNOSIS — I272 Pulmonary hypertension, unspecified: Secondary | ICD-10-CM

## 2017-06-14 DIAGNOSIS — I5023 Acute on chronic systolic (congestive) heart failure: Secondary | ICD-10-CM

## 2017-06-14 MED ORDER — FUROSEMIDE 40 MG PO TABS: 40.0000 mg | ORAL_TABLET | Freq: Every day | ORAL | 3 refills | Status: DC

## 2017-06-14 NOTE — Patient Instructions (Addendum)
Medication Instructions:  INCREASE- Furosemide 40 mg daily  If you need a refill on your cardiac medications before your next appointment, please call your pharmacy.  Labwork: BNP HERE IN OUR OFFICE AT [facility] in 10 day  Take the provided lab slips for you to take with you to the lab for you blood draw.   You will NOT need to fast   Testing/Procedures: None Ordered  Follow-Up: Your physician recommends that you schedule a follow-up appointment in: Dr Lovena Le at our [facility] [facility]    Thank you for choosing [facility] at [facility]!!

## 2017-06-17 ENCOUNTER — Encounter: Payer: Self-pay | Admitting: Cardiology

## 2017-06-17 ENCOUNTER — Telehealth: Payer: Self-pay | Admitting: Cardiology

## 2017-06-17 DIAGNOSIS — R0602 Shortness of breath: Secondary | ICD-10-CM | POA: Diagnosis not present

## 2017-06-17 NOTE — Telephone Encounter (Signed)
Closed Encounter  °

## 2017-06-18 ENCOUNTER — Ambulatory Visit: Payer: Medicare Other | Admitting: Physician Assistant

## 2017-06-18 LAB — BASIC METABOLIC PANEL
BUN/Creatinine Ratio: 14 (ref 10–24)
BUN: 16 mg/dL (ref 8–27)
CO2: 24 mmol/L (ref 20–29)
Calcium: 9.1 mg/dL (ref 8.6–10.2)
Chloride: 101 mmol/L (ref 96–106)
Creatinine, Ser: 1.16 mg/dL (ref 0.76–1.27)
GFR calc Af Amer: 67 mL/min/{1.73_m2} (ref >59)
GFR calc non Af Amer: 58 mL/min/{1.73_m2} — ABNORMAL LOW (ref >59)
Glucose: 77 mg/dL (ref 65–99)
Potassium: 4.4 mmol/L (ref 3.5–5.2)
Sodium: 140 mmol/L (ref 134–144)

## 2017-06-18 LAB — BRAIN NATRIURETIC PEPTIDE: BNP: 774.6 pg/mL — ABNORMAL HIGH (ref 0.0–100.0)

## 2017-06-19 ENCOUNTER — Telehealth: Payer: Self-pay | Admitting: Cardiology

## 2017-06-19 DIAGNOSIS — I5022 Chronic systolic (congestive) heart failure: Secondary | ICD-10-CM

## 2017-06-19 NOTE — Telephone Encounter (Signed)
New Message:    Pt stays nauseated a lot,wonder if this might be something related to his heart condition.Pt is nauseated  a lot today,very concerned.

## 2017-06-19 NOTE — Telephone Encounter (Signed)
Patient c/o legs being weak and of being fatigue today. Patient said he woke up at least every every 30 minutes last night to void. Patient also c/o that he felt nauseated this morning with gagging and dry heaves. Patient said that he had 3-4 loose stools this morning but no c/o stomach pain. Patient said that he has been eating and drinking today. Son reported that patient has been having nausea for several weeks and forgot to report this to his cardiologist at his most recent ov.   Patient said he took 1/2 of phenergan this morning for the nausea. No c/o dizziness chest pain or sob. Patient advised that message would be sent to his doctor but this may not be cardiac related and that he should contact his GI doctor and his PCP. Patient advised to stay hydrated to prevent dehydration. Patient said the he has an appointment with Dr. Vira Agar on 06/27/17 (GI doctor). Patient advised that message would be sent to his doctor and that if his symptoms got worse that he needed to go to the ED for an evaluation.  Recent lab results for BMET and BNP given to patient while on phone.

## 2017-06-19 NOTE — Telephone Encounter (Signed)
Wife informed and verbalized understanding of plan. 

## 2017-06-19 NOTE — Telephone Encounter (Signed)
I would suggest that he see his PCP even before the GI.  Check another BMET.

## 2017-06-19 NOTE — Telephone Encounter (Signed)
Son Octavia Bruckner not listed on DPR and advised to have patient sign DPR at next visit so that we can discuss information with him. Instructed son to have patient call office and asked for [facility]. Awaiting call back from patient.

## 2017-06-20 DIAGNOSIS — I5022 Chronic systolic (congestive) heart failure: Secondary | ICD-10-CM | POA: Diagnosis not present

## 2017-06-20 LAB — BASIC METABOLIC PANEL
BUN/Creatinine Ratio: 14 (ref 10–24)
BUN: 14 mg/dL (ref 8–27)
CO2: 26 mmol/L (ref 20–29)
Calcium: 9.3 mg/dL (ref 8.6–10.2)
Chloride: 100 mmol/L (ref 96–106)
Creatinine, Ser: 1.03 mg/dL (ref 0.76–1.27)
GFR calc Af Amer: 77 mL/min/{1.73_m2} (ref >59)
GFR calc non Af Amer: 67 mL/min/{1.73_m2} (ref >59)
Glucose: 90 mg/dL (ref 65–99)
Potassium: 5.1 mmol/L (ref 3.5–5.2)
Sodium: 139 mmol/L (ref 134–144)

## 2017-06-24 ENCOUNTER — Ambulatory Visit (INDEPENDENT_AMBULATORY_CARE_PROVIDER_SITE_OTHER): Payer: Medicare Other | Admitting: *Deleted

## 2017-06-24 ENCOUNTER — Ambulatory Visit: Payer: Medicare Other | Admitting: Cardiology

## 2017-06-24 DIAGNOSIS — I5022 Chronic systolic (congestive) heart failure: Secondary | ICD-10-CM

## 2017-06-24 DIAGNOSIS — I428 Other cardiomyopathies: Secondary | ICD-10-CM

## 2017-06-24 NOTE — Progress Notes (Signed)
Remote ICD transmission.   

## 2017-06-25 LAB — CUP PACEART REMOTE DEVICE CHECK
Battery Remaining Longevity: 57 mo
Battery Voltage: 2.96 V
Brady Statistic AP VP Percent: 0 %
Brady Statistic AP VS Percent: 0 %
Brady Statistic AS VP Percent: 96.7 %
Brady Statistic AS VS Percent: 3.3 %
Brady Statistic RA Percent Paced: 0 %
Brady Statistic RV Percent Paced: 97.1 %
Date Time Interrogation Session: 20190318112404
HighPow Impedance: 39 Ohm
HighPow Impedance: 49 Ohm
Implantable Lead Implant Date: 20070525
Implantable Lead Implant Date: 20070525
Implantable Lead Implant Date: 20070525
Implantable Lead Location: 753858
Implantable Lead Location: 753859
Implantable Lead Location: 753860
Implantable Lead Model: 4194
Implantable Lead Model: 5076
Implantable Lead Model: 6949
Implantable Pulse Generator Implant Date: 20170116
Lead Channel Impedance Value: 190 Ohm
Lead Channel Impedance Value: 304 Ohm
Lead Channel Impedance Value: 361 Ohm
Lead Channel Impedance Value: 399 Ohm
Lead Channel Impedance Value: 456 Ohm
Lead Channel Impedance Value: 513 Ohm
Lead Channel Pacing Threshold Amplitude: 0.875 V
Lead Channel Pacing Threshold Amplitude: 0.875 V
Lead Channel Pacing Threshold Pulse Width: 0.4 ms
Lead Channel Pacing Threshold Pulse Width: 0.4 ms
Lead Channel Sensing Intrinsic Amplitude: 16.625 mV
Lead Channel Sensing Intrinsic Amplitude: 16.625 mV
Lead Channel Sensing Intrinsic Amplitude: 4.25 mV
Lead Channel Sensing Intrinsic Amplitude: 4.25 mV
Lead Channel Setting Pacing Amplitude: 1.5 V
Lead Channel Setting Pacing Amplitude: 2.5 V
Lead Channel Setting Pacing Pulse Width: 0.4 ms
Lead Channel Setting Pacing Pulse Width: 0.4 ms
Lead Channel Setting Sensing Sensitivity: 0.45 mV

## 2017-06-26 ENCOUNTER — Encounter: Payer: Self-pay | Admitting: Cardiology

## 2017-06-26 ENCOUNTER — Ambulatory Visit (INDEPENDENT_AMBULATORY_CARE_PROVIDER_SITE_OTHER): Payer: Medicare Other

## 2017-06-26 DIAGNOSIS — I4892 Unspecified atrial flutter: Secondary | ICD-10-CM | POA: Diagnosis not present

## 2017-06-26 DIAGNOSIS — R0602 Shortness of breath: Secondary | ICD-10-CM | POA: Diagnosis not present

## 2017-06-26 DIAGNOSIS — I483 Typical atrial flutter: Secondary | ICD-10-CM | POA: Diagnosis not present

## 2017-06-26 DIAGNOSIS — Z7901 Long term (current) use of anticoagulants: Secondary | ICD-10-CM

## 2017-06-26 DIAGNOSIS — Z79899 Other long term (current) drug therapy: Secondary | ICD-10-CM | POA: Diagnosis not present

## 2017-06-26 DIAGNOSIS — D485 Neoplasm of uncertain behavior of skin: Secondary | ICD-10-CM | POA: Diagnosis not present

## 2017-06-26 DIAGNOSIS — Z5181 Encounter for therapeutic drug level monitoring: Secondary | ICD-10-CM

## 2017-06-26 LAB — POCT INR: INR: 3.1

## 2017-06-26 NOTE — Patient Instructions (Signed)
Please skip coumadin today, then resume previous dosage of 5 mg daily except 2.5 mg on Mondays. Please be consistent with your greens intake. Recheck in 2 weeks.

## 2017-06-27 DIAGNOSIS — R1013 Epigastric pain: Secondary | ICD-10-CM | POA: Diagnosis not present

## 2017-06-27 DIAGNOSIS — D044 Carcinoma in situ of skin of scalp and neck: Secondary | ICD-10-CM | POA: Diagnosis not present

## 2017-06-27 LAB — BASIC METABOLIC PANEL
BUN/Creatinine Ratio: 20 (ref 10–24)
BUN: 22 mg/dL (ref 8–27)
CO2: 26 mmol/L (ref 20–29)
Calcium: 9.6 mg/dL (ref 8.6–10.2)
Chloride: 96 mmol/L (ref 96–106)
Creatinine, Ser: 1.09 mg/dL (ref 0.76–1.27)
GFR calc Af Amer: 72 mL/min/{1.73_m2} (ref >59)
GFR calc non Af Amer: 62 mL/min/{1.73_m2} (ref >59)
Glucose: 91 mg/dL (ref 65–99)
Potassium: 4.6 mmol/L (ref 3.5–5.2)
Sodium: 137 mmol/L (ref 134–144)

## 2017-07-01 ENCOUNTER — Other Ambulatory Visit: Payer: Self-pay | Admitting: Family Medicine

## 2017-07-01 DIAGNOSIS — D696 Thrombocytopenia, unspecified: Secondary | ICD-10-CM

## 2017-07-01 DIAGNOSIS — E538 Deficiency of other specified B group vitamins: Secondary | ICD-10-CM

## 2017-07-01 DIAGNOSIS — R739 Hyperglycemia, unspecified: Secondary | ICD-10-CM

## 2017-07-02 ENCOUNTER — Ambulatory Visit (INDEPENDENT_AMBULATORY_CARE_PROVIDER_SITE_OTHER): Payer: Medicare Other

## 2017-07-02 ENCOUNTER — Encounter: Payer: Self-pay | Admitting: Internal Medicine

## 2017-07-02 ENCOUNTER — Ambulatory Visit: Payer: Medicare Other

## 2017-07-02 VITALS — BP 110/68 | HR 71 | Temp 97.6°F | Ht 66.0 in | Wt 143.0 lb

## 2017-07-02 DIAGNOSIS — D696 Thrombocytopenia, unspecified: Secondary | ICD-10-CM

## 2017-07-02 DIAGNOSIS — E538 Deficiency of other specified B group vitamins: Secondary | ICD-10-CM

## 2017-07-02 DIAGNOSIS — Z0000 Encounter for general adult medical examination without abnormal findings: Secondary | ICD-10-CM

## 2017-07-02 LAB — CBC WITH DIFFERENTIAL/PLATELET
Basophils Absolute: 0.1 10*3/uL (ref 0.0–0.1)
Basophils Relative: 1.1 % (ref 0.0–3.0)
Eosinophils Absolute: 0.2 10*3/uL (ref 0.0–0.7)
Eosinophils Relative: 3.3 % (ref 0.0–5.0)
HCT: 38.5 % — ABNORMAL LOW (ref 39.0–52.0)
Hemoglobin: 13 g/dL (ref 13.0–17.0)
Lymphocytes Relative: 23.1 % (ref 12.0–46.0)
Lymphs Abs: 1.6 10*3/uL (ref 0.7–4.0)
MCHC: 33.6 g/dL (ref 30.0–36.0)
MCV: 90.3 fl (ref 78.0–100.0)
Monocytes Absolute: 0.5 10*3/uL (ref 0.1–1.0)
Monocytes Relative: 8.1 % (ref 3.0–12.0)
Neutro Abs: 4.3 10*3/uL (ref 1.4–7.7)
Neutrophils Relative %: 64.4 % (ref 43.0–77.0)
Platelets: 129 10*3/uL — ABNORMAL LOW (ref 150.0–400.0)
RBC: 4.27 Mil/uL (ref 4.22–5.81)
RDW: 15.4 % (ref 11.5–15.5)
WBC: 6.7 10*3/uL (ref 4.0–10.5)

## 2017-07-02 LAB — VITAMIN B12: Vitamin B-12: 508 pg/mL (ref 211–911)

## 2017-07-02 NOTE — Patient Instructions (Signed)
[name] , Thank you for taking time to come for your Medicare Wellness Visit. I appreciate your ongoing commitment to your health goals. Please review the following plan we discussed and let me know if I can assist you in the future.   These are the goals we discussed: Goals    . Follow up with Primary Care Provider     Starting 07/02/2017, I will continue to take medications as prescribed and to keep appointments with PCP as scheduled.        This is a list of the screening recommended for you and due dates:  Health Maintenance  Topic Date Due  . Flu Shot  06/29/2025*  . Pneumonia vaccines (1 of 2 - PCV13) 06/29/2025*  . DTaP/Tdap/Td vaccine (2 - Td) 05/25/2027  . Tetanus Vaccine  05/25/2027  *Topic was postponed. The date shown is not the original due date.   Preventive Care for Adults  A healthy lifestyle and preventive care can promote health and wellness. Preventive health guidelines for adults include the following key practices.  . A routine yearly physical is a good way to check with your health care provider about your health and preventive screening. It is a chance to share any concerns and updates on your health and to receive a thorough exam.  . Visit your dentist for a routine exam and preventive care every 6 months. Brush your teeth twice a day and floss once a day. Good oral hygiene prevents tooth decay and gum disease.  . The frequency of eye exams is based on your age, health, family medical history, use  of contact lenses, and other factors. Follow your health care provider's recommendations for frequency of eye exams.  . Eat a healthy diet. Foods like vegetables, fruits, whole grains, low-fat dairy products, and lean protein foods contain the nutrients you need without too many calories. Decrease your intake of foods high in solid fats, added sugars, and salt. Eat the right amount of calories for you. Get information about a proper diet from your health care  provider, if necessary.  . Regular physical exercise is one of the most important things you can do for your health. Most adults should get at least 150 minutes of moderate-intensity exercise (any activity that increases your heart rate and causes you to sweat) each week. In addition, most adults need muscle-strengthening exercises on 2 or more days a week.  Silver Sneakers may be a benefit available to you. To determine eligibility, you may visit the website: www.silversneakers.com or contact program at (916)377-4989 Mon-Fri between 8AM-8PM.   . Maintain a healthy weight. The body mass index (BMI) is a screening tool to identify possible weight problems. It provides an estimate of body fat based on height and weight. Your health care provider can find your BMI and can help you achieve or maintain a healthy weight.   For adults 20 years and older: ? A BMI below 18.5 is considered underweight. ? A BMI of 18.5 to 24.9 is normal. ? A BMI of 25 to 29.9 is considered overweight. ? A BMI of 30 and above is considered obese.   . Maintain normal blood lipids and cholesterol levels by exercising and minimizing your intake of saturated fat. Eat a balanced diet with plenty of fruit and vegetables. Blood tests for lipids and cholesterol should begin at age 65 and be repeated every 5 years. If your lipid or cholesterol levels are high, you are over 50, or you are at high  risk for heart disease, you may need your cholesterol levels checked more frequently. Ongoing high lipid and cholesterol levels should be treated with medicines if diet and exercise are not working.  . If you smoke, find out from your health care provider how to quit. If you do not use tobacco, please do not start.  . If you choose to drink alcohol, please do not consume more than 2 drinks per day. One drink is considered to be 12 ounces (355 mL) of beer, 5 ounces (148 mL) of wine, or 1.5 ounces (44 mL) of liquor.  . If you are 36-79 years  old, ask your health care provider if you should take aspirin to prevent strokes.  . Use sunscreen. Apply sunscreen liberally and repeatedly throughout the day. You should seek shade when your shadow is shorter than you. Protect yourself by wearing long sleeves, pants, a wide-brimmed hat, and sunglasses year round, whenever you are outdoors.  . Once a month, do a whole body skin exam, using a mirror to look at the skin on your back. Tell your health care provider of new moles, moles that have irregular borders, moles that are larger than a pencil eraser, or moles that have changed in shape or color.

## 2017-07-02 NOTE — Progress Notes (Signed)
PCP notes:   Health maintenance:  No gaps identified.  Abnormal screenings:   Hearing - failed  Hearing Screening   125Hz  250Hz  500Hz  1000Hz  2000Hz  3000Hz  4000Hz  6000Hz  8000Hz   Right ear:   40 40 0  0    Left ear:   40 40 0  0     Fall risk - hx of single fall Fall Risk  07/02/2017 06/29/2016 04/12/2015 04/12/2015 03/16/2014  Falls in the past year? Yes No No No No  Comment tripped and fell while shopping - - - -  Number falls in past yr: 1 - - - -  Injury with Fall? Yes - - - -    Patient concerns:   Pt reports sinus pain on right side of face. Patient states there is blood in drainage from nose. PCP notified. Acute visit scheduled 07/03/17.   Nurse concerns:  None  Next PCP appt:   07/03/2017 @ 1115

## 2017-07-02 NOTE — Progress Notes (Signed)
Subjective:   [name] is a 84 y.o. male who presents for Medicare Annual/Subsequent preventive examination.  Review of Systems:  N/A Cardiac Risk Factors include: advanced age (>79men, >60 women);male gender     Objective:    Vitals: BP 110/68 (BP Location: Right Arm, Patient Position: Sitting, Cuff Size: Normal)   Pulse 71   Temp 97.6 F (36.4 C) (Oral)   Ht 5\' 6"  (1.676 m) Comment: shoes  Wt 143 lb (64.9 kg)   SpO2 96%   BMI 23.08 kg/m   Body mass index is 23.08 kg/m.  Advanced Directives 07/02/2017 05/24/2017 08/24/2016 06/29/2016 01/18/2016 01/17/2016 01/06/2016  Does Patient Have a Medical Advance Directive? Yes Yes Yes Yes Yes Yes Yes  Type of Paramedic of [place];Living will Healthcare Power of [place] Living will [place];Living will Living will;Healthcare Power of Attorney Living will;Healthcare Power of [place];Living will  Does patient want to make changes to medical advance directive? - - - - No - Patient declined No - Patient declined No - Patient declined  Copy of [place] in Chart? No - copy requested No - copy requested - No - copy requested No - copy requested No - copy requested No - copy requested    Tobacco Social History   Tobacco Use  Smoking Status Never Smoker  Smokeless Tobacco Never Used     Counseling given: No   Clinical Intake:  Pre-visit preparation completed: Yes  Pain : 0-10 Pain Score: 1  Pain Type: Acute pain Pain Location: Head Pain Orientation: Right Pain Onset: In the past 7 days Pain Frequency: Intermittent     Nutritional Status: BMI of 19-24  Normal Nutritional Risks: None Diabetes: No  How often do you need to have someone help you when you read instructions, pamphlets, or other written materials from your doctor or pharmacy?: 1 - Never What is the last grade level you completed in school?: 11th  grade  Interpreter Needed?: No  Comments: pt lives with spouse Information entered by :: LPinson, LPN  Past Medical History:  Diagnosis Date  . AICD (automatic cardioverter/defibrillator) present    s/p gen change 04/2015 w/ MDT [name] Crt-D  DTBA1D1, Serial number [id number]  . Allergic rhinitis    [name]  . Anemia   . Anxiety   . Arthritis   . Atrial flutter ([place])    A.  Status post cardioversion; B.  Tikosyn therapy - failed, remains in aflutter  . Bilateral pneumonia 11/02/2013   treated with levaquin  . BPH (benign prostatic hyperplasia)   . 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 Lucky Cowboy)  . Chronic sinusitis   . Chronic systolic congestive heart failure ([place])   . COPD (chronic obstructive pulmonary disease) ([place])    spirometry 2015 - no obstruction, + mild restrictive lung disease  . Extrinsic asthma    [name]  . GERD (gastroesophageal reflux disease) 2010   h/o esophageal stricture with dilation, LA grade C reflux esophagitis by EGD 2010  . Goiter   . History of diverticulitis of colon   . History of GI bleed    Secondary to hemorrhoids  . IBS (irritable bowel syndrome)   . LBBB (left bundle branch block)   . Multiple pulmonary nodules 06/2013   [facility]UL ([facility]) - ?vasculitis as of last CT [facility]  . NICM (nonischemic cardiomyopathy) ([place])  Cardiac catheterization March 2006 without coronary disease; EF 25% 2018 Jan  . [facility])    Past Surgical History:  Procedure Laterality Date  . BACK SURGERY  1997   bulging disks  . BiV-AICD implant     Medtronic  . CARDIAC CATHETERIZATION  06/22/04   Severe, nonischemic cardiomyopathy,EF 25-30%  . CARDIOVERSION  02/22/09   AFlutter-([facility])  . CHOLECYSTECTOMY    . COLONOSCOPY  10/99   Divertic, splenic, hepatic fleure only  . COLONOSCOPY  10/21/08   aborted-divertics, int hemms (Dr. Vira Agar)  . CORONARY ANGIOPLASTY    . EP IMPLANTABLE DEVICE N/A 04/25/2015    Procedure: BIV ICD Generator Changeout;  Surgeon: Evans Lance, MD;  Location: [place] CV LAB;  Service: Cardiovascular;  Laterality: N/A;  . ESOPHAGEAL DILATION  02/18/98  . ESOPHAGOGASTRODUODENOSCOPY  01/1998   stricture, sliding HH, GERD  . ESOPHAGOGASTRODUODENOSCOPY  04/08/01   stricture, gastritis, HH, GERD-no dilation(Dr. Henrene Pastor)  . ESOPHAGOGASTRODUODENOSCOPY  12/22/04   stricture; gastritis; duodenitis, GERD  . ESOPHAGOGASTRODUODENOSCOPY  10/21/08   Reflux esophagitis; Erythem. Duod. (Dr. Vira Agar)  . ESOPHAGOGASTRODUODENOSCOPY  08/2013   erythema gastric fundus - minimal gastritis, HH (Oh)  . ESOPHAGOGASTRODUODENOSCOPY  08/2016   dysphagia - mod schatzki rin, dilated Tiffany Kocher)  . ESOPHAGOGASTRODUODENOSCOPY (EGD) WITH PROPOFOL N/A 08/24/2016   mod schatzki ring dilated, duodenal deformity Vira Agar, Gavin Pound, MD)  . goiter removal    . HERNIA REPAIR    . HERNIA REPAIR  01/30/02   Dr. Hassell Done  . INSERT / REPLACE / REMOVE PACEMAKER    . NOSE SURGERY  1971  . PENILE PROSTHESIS IMPLANT  2007   [place]  . PFT  10/2013   FVC 61%, FEV1 63%, ratio 0.76  . Pulmonary eval  04/2002   ([facility]) Chronic congestive symptoms  . REVERSE SHOULDER ARTHROPLASTY Right 01/17/2016   Procedure: REVERSE SHOULDER ARTHROPLASTY;  Surgeon: Corky Mull, MD;  Location: [facility] ORS;  Service: Orthopedics;  Laterality: Right;  . REVERSE TOTAL SHOULDER ARTHROPLASTY Right 01/2016   Poggi  . US ECHOCARDIOGRAPHY  07/13/04   EF 25-30%, Mod LVH; LA severe dilation; Mild AR; IRTR  . US ECHOCARDIOGRAPHY  11/27/06   hypokinesis posterior wall, EF 35%; mild AR   Family History  Problem Relation Age of Onset  . Coronary artery disease Father   . Hypertension Father   . Heart failure Mother   . Dysphagia Sister   . Breast cancer Unknown   . Ovarian cancer Unknown   . Uterine cancer Unknown   . Other Sister        stomach problems  . Other Sister        stomach problems  . Other Sister        stomach problems  .  Alcohol abuse Maternal Uncle    Social History   Socioeconomic History  . Marital status: Married    Spouse name: Not on file  . Number of children: 3  . Years of education: Not on file  . Highest education level: Not on file  Occupational History  . Occupation: retired    Fish farm manager: RETIRED  Social Needs  . Financial resource strain: Not on file  . Food insecurity:    Worry: Not on file    Inability: Not on file  . Transportation needs:    Medical: Not on file    Non-medical: Not on file  Tobacco Use  . Smoking status: Never Smoker  . Smokeless tobacco: Never Used  Substance and Sexual  Activity  . Alcohol use: No  . Drug use: No  . Sexual activity: Never  Lifestyle  . Physical activity:    Days per week: Not on file    Minutes per session: Not on file  . Stress: Not on file  Relationships  . Social connections:    Talks on phone: Not on file    Gets together: Not on file    Attends religious service: Not on file    Active member of club or organization: Not on file    Attends meetings of clubs or organizations: Not on file    Relationship status: Not on file  Other Topics Concern  . Not on file  Social History Narrative   Lives with wife   3 children-8 grandchildren   Still works on a farm.   Retired from [place] friend with Dr. Jefm Bryant   Activity: No regular exercise   Diet: good water, fruits/vegetables daily    Outpatient Encounter Medications as of 07/02/2017  Medication Sig  . acetaminophen (TYLENOL) 500 MG tablet Take 250 mg by mouth daily as needed. Pain  . ALPRAZolam (XANAX) 0.25 MG tablet TAKE 1 TABLET BY MOUTH AT BEDTIME  . alum & mag hydroxide-simeth (MAALOX/MYLANTA) 200-200-20 MG/5ML suspension Take 15 mLs by mouth daily as needed for indigestion or heartburn.  . budesonide-formoterol (SYMBICORT) 160-4.5 MCG/ACT inhaler Inhale 2 puffs into the lungs at bedtime.  . carvedilol (COREG) 6.25 MG tablet Take 1 and 1/2  tablets by mouth twice daily  . fluticasone (FLONASE) 50 MCG/ACT nasal spray Place 1 spray into both nostrils daily.  . furosemide (LASIX) 40 MG tablet Take 1 tablet (40 mg total) by mouth daily.  Marland Kitchen guaiFENesin 200 MG tablet Take 400 mg by mouth every 4 (four) hours as needed for cough or to loosen phlegm.  . levalbuterol (XOPENEX HFA) 45 MCG/ACT inhaler Inhale 1-2 puffs into the lungs every 6 (six) hours as needed for wheezing or shortness of breath.  . Loratadine 10 MG CAPS Take 1 capsule by mouth daily as needed (allergies).   . montelukast (SINGULAIR) 10 MG tablet Take 10 mg by mouth daily as needed (BREATHING).   . polyethylene glycol (MIRALAX / GLYCOLAX) packet Take 17 g by mouth daily as needed (constipation).   . vitamin B-12 (CYANOCOBALAMIN) 500 MCG tablet Take 500 mg by mouth daily.  Marland Kitchen warfarin (COUMADIN) 5 MG tablet TAKE AS DIRECTED  BY  ANTICOAGULATION  CLINIC   No facility-administered encounter medications on file as of 07/02/2017.     Activities of Daily Living In your present state of health, do you have any difficulty performing the following activities: 07/02/2017  Hearing? Y  Vision? N  Difficulty concentrating or making decisions? N  Walking or climbing stairs? Y  Dressing or bathing? N  Doing errands, shopping? N  Preparing Food and eating ? N  Using the Toilet? N  In the past six months, have you accidently leaked urine? N  Do you have problems with loss of bowel control? N  Managing your Medications? N  Managing your Finances? N  Housekeeping or managing your Housekeeping? N  Some recent data might be hidden    Patient Care Team: Ria [place], MD as PCP - General Evans Lance, MD as Consulting Physician (Cardiology)   Assessment:   This is a routine wellness examination for [place].   Hearing Screening   125Hz  250Hz  500Hz  1000Hz  2000Hz  3000Hz  4000Hz  6000Hz  8000Hz   Right ear:   40 40 0  0    Left ear:   40 40 0  0    Vision Screening Comments:  Feb 2019 @ [facility]    Exercise Activities and Dietary recommendations Current Exercise Habits: The patient has a physically strenous job, but has no regular exercise apart from work.(runs a farm), Exercise limited by: None identified  Goals    . Follow up with Primary Care Provider     Starting 07/02/2017, I will continue to take medications as prescribed and to keep appointments with PCP as scheduled.        Fall Risk Fall Risk  07/02/2017 06/29/2016 04/12/2015 04/12/2015 03/16/2014  Falls in the past year? Yes No No No No  Comment tripped and fell while shopping - - - -  Number falls in past yr: 1 - - - -  Injury with Fall? Yes - - - -   Depression Screen PHQ 2/9 Scores 07/02/2017 06/29/2016 04/12/2015 03/16/2014  PHQ - 2 Score 0 0 0 0  PHQ- 9 Score 0 - - -    Cognitive Function MMSE - Mini Mental State Exam 07/02/2017 06/29/2016  Orientation to time 5 5  Orientation to Place 5 5  Registration 3 3  Attention/ Calculation 0 0  Recall 3 3  Language- name 2 objects 0 0  Language- repeat 1 1  Language- follow 3 step command 3 3  Language- read & follow direction 0 0  Write a sentence 0 0  Copy design 0 0  Total score 20 20     PLEASE NOTE: A Mini-Cog screen was completed. Maximum score is 20. A value of 0 denotes this part of [place] MMSE was not completed or the patient failed this part of the Mini-Cog screening.   Mini-Cog Screening Orientation to Time - Max 5 pts Orientation to Place - Max 5 pts Registration - Max 3 pts Recall - Max 3 pts Language Repeat - Max 1 pts Language Follow 3 Step Command - Max 3 pts   Immunization History  Administered Date(s) Administered  . Td 11/08/1995, 02/16/2010  . Tdap 05/24/2017  . Zoster 05/31/2006    Screening Tests Health Maintenance  Topic Date Due  . INFLUENZA VACCINE  06/29/2025 (Originally 11/07/2016)  . PNA vac Low Risk Adult (1 of 2 - PCV13) 06/29/2025 (Originally 09/21/1998)  . DTaP/Tdap/Td (2 - Td) 05/25/2027   . TETANUS/TDAP  05/25/2027      Plan:     I have personally reviewed, addressed, and noted the following in the patient's chart:  A. Medical and social history B. Use of alcohol, tobacco or illicit drugs  C. Current medications and supplements D. Functional ability and status E.  Nutritional status F.  Physical activity G. Advance directives H. List of other physicians I.  Hospitalizations, surgeries, and ER visits in previous 12 months J.  Revloc to include hearing, vision, cognitive, depression L. Referrals and appointments - none  In addition, I have reviewed and discussed with patient certain preventive protocols, quality metrics, and best practice recommendations. A written personalized care plan for preventive services as well as general preventive health recommendations were provided to patient.  See attached scanned questionnaire for additional information.   Signed,   Lindell Noe, MHA, BS, LPN Health Coach

## 2017-07-03 ENCOUNTER — Encounter: Payer: Self-pay | Admitting: Family Medicine

## 2017-07-03 ENCOUNTER — Ambulatory Visit (INDEPENDENT_AMBULATORY_CARE_PROVIDER_SITE_OTHER): Payer: Medicare Other | Admitting: Family Medicine

## 2017-07-03 VITALS — BP 118/66 | HR 64 | Temp 97.8°F | Wt 143.0 lb

## 2017-07-03 DIAGNOSIS — J019 Acute sinusitis, unspecified: Secondary | ICD-10-CM

## 2017-07-03 DIAGNOSIS — Z7901 Long term (current) use of anticoagulants: Secondary | ICD-10-CM | POA: Diagnosis not present

## 2017-07-03 MED ORDER — AZITHROMYCIN 250 MG PO TABS: ORAL_TABLET | ORAL | 0 refills | Status: DC

## 2017-07-03 NOTE — Assessment & Plan Note (Addendum)
He does have signs of sinusitis today - discussed viral vs bacterial causes. Will Rx aggressively with zpack antibitoic, advised continued flonase, singulair use, as well as more regular use of guaifenesin. Update if not improving with treatment.  I have asked he sign ROI today for latest ENT records and imaging studies.

## 2017-07-03 NOTE — Progress Notes (Signed)
HPI [name] presents for follow up of his cardiomyopathy and atrial flutter.  The last echo that I ordered in Jan was 20 - 25%.  At the last visit he was having weakness, increased DOE with minimal exertion and edema.  He is breathing better.  I did review his recent creatinine and it was stable with the higher dose of Lasix.  He thinks he is breathing better.  He is not having any PND or orthopnea. He has no chest pain.  His legs are not swelling.     Allergies  Allergen Reactions  . Clarithromycin Nausea Only  . Famotidine Other (See Comments)    ABD. PAIN  . Levaquin [Levofloxacin] Other (See Comments)    Stomach pain, has to eat a lot of food  . Omeprazole Diarrhea  . Oxytetracycline Other (See Comments)    BUMPS  . Penicillins Swelling      . Proton Pump Inhibitors Other (See Comments)    GI upset, diarrhea, gas, bloating  . Ranitidine Diarrhea  . Doxycycline Rash    Current Outpatient Medications  Medication Sig Dispense Refill  . acetaminophen (TYLENOL) 500 MG tablet Take 250 mg by mouth daily as needed. Pain    . ALPRAZolam (XANAX) 0.25 MG tablet TAKE 1 TABLET BY MOUTH AT BEDTIME 60 tablet 0  . alum & mag hydroxide-simeth (MAALOX/MYLANTA) 200-200-20 MG/5ML suspension Take 15 mLs by mouth daily as needed for indigestion or heartburn.    Marland Kitchen azithromycin (ZITHROMAX) 250 MG tablet Take two tablets on day one followed by one tablet on days 2-5 6 each 0  . budesonide-formoterol (SYMBICORT) 160-4.5 MCG/ACT inhaler Inhale 2 puffs into the lungs at bedtime.    . carvedilol (COREG) 6.25 MG tablet Take 1 and 1/2 tablets by mouth twice daily 270 tablet 3  . fluticasone (FLONASE) 50 MCG/ACT nasal spray Place 1 spray into both nostrils daily.    . furosemide (LASIX) 40 MG tablet Take 1 tablet (40 mg total) by mouth daily. 90 tablet 3  . guaiFENesin 200 MG tablet Take 400 mg by mouth every 4 (four) hours as needed for cough or to loosen phlegm.    . levalbuterol  (XOPENEX HFA) 45 MCG/ACT inhaler Inhale 1-2 puffs into the lungs every 6 (six) hours as needed for wheezing or shortness of breath. 1 Inhaler 1  . Loratadine 10 MG CAPS Take 1 capsule by mouth daily as needed (allergies).     . montelukast (SINGULAIR) 10 MG tablet Take 10 mg by mouth daily as needed (BREATHING).     . polyethylene glycol (MIRALAX / GLYCOLAX) packet Take 17 g by mouth daily as needed (constipation).     . vitamin B-12 (CYANOCOBALAMIN) 500 MCG tablet Take 500 mg by mouth daily.    Marland Kitchen warfarin (COUMADIN) 5 MG tablet TAKE AS DIRECTED  BY  ANTICOAGULATION  CLINIC 135 tablet 3   No current facility-administered medications for this visit.     Past Medical History:  Diagnosis Date  . AICD (automatic cardioverter/defibrillator) present    s/p gen change 04/2015 w/ MDT [name] Crt-D  DTBA1D1, Serial number [id number]  . Allergic rhinitis    [name]  . Anemia   . Anxiety   . Arthritis   . Atrial flutter ([place])    A.  Status post cardioversion; B.  Tikosyn therapy - failed, remains in aflutter  . Bilateral pneumonia 11/02/2013   treated with levaquin  . BPH (benign prostatic hyperplasia)   .  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 Lucky Cowboy)  . Chronic sinusitis   . Chronic systolic congestive heart failure ([place])   . COPD (chronic obstructive pulmonary disease) ([place])    spirometry 2015 - no obstruction, + mild restrictive lung disease  . Extrinsic asthma    [name]  . GERD (gastroesophageal reflux disease) 2010   h/o esophageal stricture with dilation, LA grade C reflux esophagitis by EGD 2010  . Goiter   . History of diverticulitis of colon   . History of GI bleed    Secondary to hemorrhoids  . IBS (irritable bowel syndrome)   . LBBB (left bundle branch block)   . Multiple pulmonary nodules 06/2013   RUL ([facility]ix [facility]) - ?vasculitis as of last CT [facility]  . NICM (nonischemic cardiomyopathy) [facility])    Cardiac catheterization March  2006 without coronary disease; EF 25% 2018 Jan  . [facility])     Past Surgical History:  Procedure Laterality Date  . BACK SURGERY  1997   bulging disks  . BiV-AICD implant     [employer]tr[employer]  . CARDIAC CATHETERIZATION  06/22/04   Severe, nonischemic cardiomyopathy,EF 25-30%  . CARDIOVERSION  02/22/09   AFlutter-([facility])  . CHOLECYSTECTOMY    . COLONOSCOPY  10/99   Divertic, splenic, hepatic fleure only  . COLONOSCOPY  10/21/08   aborted-divertics, int hemms (Dr. Vira Agar)  . CORONARY ANGIOPLASTY    . EP IMPLANTABLE DEVICE N/A 04/25/2015   Procedure: BIV ICD Generator Changeout;  Surgeon: Evans Lance, MD;  Location: [place] CV LAB;  Service: Cardiovascular;  Laterality: N/A;  . ESOPHAGEAL DILATION  02/18/98  . ESOPHAGOGASTRODUODENOSCOPY  01/1998   stricture, sliding HH, GERD  . ESOPHAGOGASTRODUODENOSCOPY  04/08/01   stricture, gastritis, HH, GERD-no dilation(Dr. Henrene Pastor)  . ESOPHAGOGASTRODUODENOSCOPY  12/22/04   stricture; gastritis; duodenitis, GERD  . ESOPHAGOGASTRODUODENOSCOPY  10/21/08   Reflux esophagitis; Erythem. Duod. (Dr. Vira Agar)  . ESOPHAGOGASTRODUODENOSCOPY  08/2013   erythema gastric fundus - minimal gastritis, HH ([place])  . ESOPHAGOGASTRODUODENOSCOPY  08/2016   dysphagia - mod schatzki rin, dilated Tiffany Kocher)  . ESOPHAGOGASTRODUODENOSCOPY (EGD) WITH PROPOFOL N/A 08/24/2016   mod schatzki ring dilated, duodenal deformity Vira Agar, Gavin Pound, MD)  . goiter removal    . HERNIA REPAIR    . HERNIA REPAIR  01/30/02   Dr. Hassell Done  . INSERT / REPLACE / REMOVE PACEMAKER    . NOSE SURGERY  1971  . PENILE PROSTHESIS IMPLANT  2007   [place]  . PFT  10/2013   FVC 61%, FEV1 63%, ratio 0.76  . Pulmonary eval  04/2002   ([facility]) Chronic congestive symptoms  . REVERSE SHOULDER ARTHROPLASTY Right 01/17/2016   Procedure: REVERSE SHOULDER ARTHROPLASTY;  Surgeon: Corky Mull, MD;  Location: [facility] ORS;  Service: Orthopedics;  Laterality: Right;  . REVERSE TOTAL SHOULDER ARTHROPLASTY Right  01/2016   Poggi  . US ECHOCARDIOGRAPHY  07/13/04   EF 25-30%, Mod LVH; LA severe dilation; Mild AR; IRTR  . US ECHOCARDIOGRAPHY  11/27/06   hypokinesis posterior wall, EF 35%; mild AR    ROS:  As stated in the HPI and negative for all other systems.   PHYSICAL EXAM BP 113/71   Pulse 80   Ht 5\' 6"  (1.676 m)   Wt 144 lb 3.2 oz (65.4 kg)   SpO2 96%   BMI 23.27 kg/m   GENERAL:  Well appearing NECK: Mild jugular venous distention, waveform within normal  limits, carotid upstroke brisk and symmetric, no bruits, no thyromegaly LUNGS:  Clear to auscultation bilaterally CHEST:  Unremarkable HEART:  PMI not displaced or sustained,S1 and S2 within normal limits, no S3, no S4, no clicks, no rubs, 2 out of 6 apical systolic murmur radiating slightly at the aortic outflow tract, no diastolic murmurs murmurs ABD:  Flat, positive bowel sounds normal in frequency in pitch, no bruits, no rebound, no guarding, no midline pulsatile mass, no hepatomegaly, no splenomegaly EXT:  2 plus pulses throughout, no edema, no cyanosis no clubbing    EKG:  NA    07/04/2017  ASSESSMENT AND PLAN  Nonischemic cardiomyopathy -  He does think he is breathing better.  I will keep him on the meds as listed.  He does have follow-up with Dr. Lovena Le to see if there is anything that can be done with the timing of his device given his recent exacerbation of symptoms.   Atrial flutter -   [name] has a CHA2DS2 - VASc score of 3. He will continue the meds as listed.   Pulmonary HTN - Given the fact that he is feeling better will consider right heart catheterization.  However, this will be the next step if he starts to decline again.

## 2017-07-03 NOTE — Assessment & Plan Note (Signed)
Will forward note to coumadin clinic to be aware we're starting zpack.

## 2017-07-03 NOTE — Patient Instructions (Addendum)
Sign release for ENT records.  You do have congestion and inflammation on the right sinuses - we will do zpack antibiotic. Continue flonase and singulair, increase mucinex (guaifenesin) use.  Call coumadin clinic to let them know we're starting zpack.

## 2017-07-03 NOTE — Progress Notes (Signed)
BP 118/66 (BP Location: Left Arm, Patient Position: Sitting, Cuff Size: Normal)   Pulse 64   Temp 97.8 F (36.6 C) (Oral)   Wt 143 lb (64.9 kg)   SpO2 97%   BMI 23.08 kg/m    CC: R sinus congestion Subjective:    Patient ID: [name], male    DOB: [birth year], 84 y.o.   MRN: [id number]  HPI: [name] is a 84 y.o. male presenting on 07/03/2017 for Sinus Problem (Right nasal congestion, bloody drainage and facial pressure. Sxs started 3-4 days ago. )   Chronic sinus congestion followed by Dr Pryor Ochoa ENT - has been treated with prednisone, antibiotic courses including amoxicillin (05/2017) and ceftin (04/2017), most recently planned levaquin nasal spray was ordered for chronic sinusitis. He has had sinus surgery remotely (1971) and more recently as well.   Here today with 3-4d h/o worsening R sinus congestion, pressure headache, tooth ache, blowing up bloody and yellow mucous. Wants zpack antibiotic. Tylenol has helped. He endorses taking flonase every night. He is taking singulair.  No fevers/chills, dizziness, dyspnea or wheezing or cough, ST or PNDrainage.  Taking mucinex occasionally.  Referred to GI Tiffany Kocher) seen on Friday - started on carafate and zantac. Note not yet available.   I don't have ENT records available - will request.   Relevant past medical, surgical, family and social history reviewed and updated as indicated. Interim medical history since our last visit reviewed. Allergies and medications reviewed and updated. Outpatient Medications Prior to Visit  Medication Sig Dispense Refill  . acetaminophen (TYLENOL) 500 MG tablet Take 250 mg by mouth daily as needed. Pain    . ALPRAZolam (XANAX) 0.25 MG tablet TAKE 1 TABLET BY MOUTH AT BEDTIME 60 tablet 0  . alum & mag hydroxide-simeth (MAALOX/MYLANTA) 200-200-20 MG/5ML suspension Take 15 mLs by mouth daily as needed for indigestion or heartburn.    . budesonide-formoterol (SYMBICORT) 160-4.5 MCG/ACT  inhaler Inhale 2 puffs into the lungs at bedtime.    . carvedilol (COREG) 6.25 MG tablet Take 1 and 1/2 tablets by mouth twice daily 270 tablet 3  . fluticasone (FLONASE) 50 MCG/ACT nasal spray Place 1 spray into both nostrils daily.    . furosemide (LASIX) 40 MG tablet Take 1 tablet (40 mg total) by mouth daily. 90 tablet 3  . guaiFENesin 200 MG tablet Take 400 mg by mouth every 4 (four) hours as needed for cough or to loosen phlegm.    . levalbuterol (XOPENEX HFA) 45 MCG/ACT inhaler Inhale 1-2 puffs into the lungs every 6 (six) hours as needed for wheezing or shortness of breath. 1 Inhaler 1  . Loratadine 10 MG CAPS Take 1 capsule by mouth daily as needed (allergies).     . montelukast (SINGULAIR) 10 MG tablet Take 10 mg by mouth daily as needed (BREATHING).     . polyethylene glycol (MIRALAX / GLYCOLAX) packet Take 17 g by mouth daily as needed (constipation).     . vitamin B-12 (CYANOCOBALAMIN) 500 MCG tablet Take 500 mg by mouth daily.    Marland Kitchen warfarin (COUMADIN) 5 MG tablet TAKE AS DIRECTED  BY  ANTICOAGULATION  CLINIC 135 tablet 3   No facility-administered medications prior to visit.      Per HPI unless specifically indicated in ROS section below Review of Systems     Objective:    BP 118/66 (BP Location: Left Arm, Patient Position: Sitting, Cuff Size: Normal)   Pulse 64   Temp 97.8 F (36.6  C) (Oral)   Wt 143 lb (64.9 kg)   SpO2 97%   BMI 23.08 kg/m   Wt Readings from Last 3 Encounters:  07/03/17 143 lb (64.9 kg)  07/02/17 143 lb (64.9 kg)  06/14/17 145 lb 12.8 oz (66.1 kg)    Physical Exam  Constitutional: He appears well-developed and well-nourished. No distress.  HENT:  Head: Normocephalic and atraumatic.  Right Ear: Hearing, tympanic membrane, external ear and ear canal normal.  Left Ear: Hearing, tympanic membrane, external ear and ear canal normal.  Nose: Mucosal edema present. No rhinorrhea. Right sinus exhibits no maxillary sinus tenderness and no frontal sinus  tenderness. Left sinus exhibits no maxillary sinus tenderness and no frontal sinus tenderness.  Mouth/Throat: Uvula is midline, oropharynx is clear and moist and mucous membranes are normal. No oropharyngeal exudate, posterior oropharyngeal edema, posterior oropharyngeal erythema or tonsillar abscesses.  Right nasal mucosal congestion with green/yellow mucous present deep nasal passage L nasal mucosa not inflamed  Eyes: Pupils are equal, round, and reactive to light. Conjunctivae and EOM are normal. No scleral icterus.  Neck: Normal range of motion. Neck supple.  Lymphadenopathy:    He has no cervical adenopathy.  Skin: Skin is warm and dry. No rash noted.  Nursing note and vitals reviewed.     Assessment & Plan:   Problem List Items Addressed This Visit    Acute sinusitis - Primary    He does have signs of sinusitis today - discussed viral vs bacterial causes. Will Rx aggressively with zpack antibitoic, advised continued flonase, singulair use, as well as more regular use of guaifenesin. Update if not improving with treatment.  I have asked he sign ROI today for latest ENT records and imaging studies.       Relevant Medications   azithromycin (ZITHROMAX) 250 MG tablet   Warfarin anticoagulation    Will forward note to coumadin clinic to be aware we're starting zpack.           Meds ordered this encounter  Medications  . azithromycin (ZITHROMAX) 250 MG tablet    Sig: Take two tablets on day one followed by one tablet on days 2-5    Dispense:  6 each    Refill:  0   No orders of the defined types were placed in this encounter.   Follow up plan: Return if symptoms worsen or fail to improve.  Ria Bush, MD

## 2017-07-04 ENCOUNTER — Encounter: Payer: Self-pay | Admitting: Cardiology

## 2017-07-04 ENCOUNTER — Ambulatory Visit (INDEPENDENT_AMBULATORY_CARE_PROVIDER_SITE_OTHER): Payer: Medicare Other | Admitting: Cardiology

## 2017-07-04 VITALS — BP 113/71 | HR 80 | Ht 66.0 in | Wt 144.2 lb

## 2017-07-04 DIAGNOSIS — I5023 Acute on chronic systolic (congestive) heart failure: Secondary | ICD-10-CM | POA: Diagnosis not present

## 2017-07-04 DIAGNOSIS — I272 Pulmonary hypertension, unspecified: Secondary | ICD-10-CM | POA: Diagnosis not present

## 2017-07-04 NOTE — Patient Instructions (Signed)
No change  With current medications    Your physician recommends that you schedule a follow-up appointment in June 2019 with DR [facility]    If you need a refill on your cardiac medications before your next appointment, please call your pharmacy.

## 2017-07-09 ENCOUNTER — Encounter: Payer: Self-pay | Admitting: Family Medicine

## 2017-07-09 ENCOUNTER — Telehealth: Payer: Self-pay | Admitting: Family Medicine

## 2017-07-09 ENCOUNTER — Ambulatory Visit (INDEPENDENT_AMBULATORY_CARE_PROVIDER_SITE_OTHER): Payer: Medicare Other | Admitting: Family Medicine

## 2017-07-09 VITALS — BP 112/70 | HR 64 | Temp 97.9°F | Ht 66.0 in | Wt 144.0 lb

## 2017-07-09 DIAGNOSIS — E538 Deficiency of other specified B group vitamins: Secondary | ICD-10-CM | POA: Diagnosis not present

## 2017-07-09 DIAGNOSIS — I5022 Chronic systolic (congestive) heart failure: Secondary | ICD-10-CM | POA: Diagnosis not present

## 2017-07-09 DIAGNOSIS — I429 Cardiomyopathy, unspecified: Secondary | ICD-10-CM

## 2017-07-09 DIAGNOSIS — J019 Acute sinusitis, unspecified: Secondary | ICD-10-CM

## 2017-07-09 DIAGNOSIS — R5383 Other fatigue: Secondary | ICD-10-CM | POA: Diagnosis not present

## 2017-07-09 DIAGNOSIS — Z7189 Other specified counseling: Secondary | ICD-10-CM

## 2017-07-09 DIAGNOSIS — D696 Thrombocytopenia, unspecified: Secondary | ICD-10-CM | POA: Diagnosis not present

## 2017-07-09 DIAGNOSIS — J988 Other specified respiratory disorders: Secondary | ICD-10-CM

## 2017-07-09 DIAGNOSIS — K219 Gastro-esophageal reflux disease without esophagitis: Secondary | ICD-10-CM

## 2017-07-09 DIAGNOSIS — J309 Allergic rhinitis, unspecified: Secondary | ICD-10-CM

## 2017-07-09 DIAGNOSIS — F419 Anxiety disorder, unspecified: Secondary | ICD-10-CM | POA: Diagnosis not present

## 2017-07-09 MED ORDER — ALPRAZOLAM 0.25 MG PO TABS: 0.2500 mg | ORAL_TABLET | Freq: Every day | ORAL | 0 refills | Status: DC

## 2017-07-09 NOTE — Assessment & Plan Note (Signed)
Continue 533mcg daily.

## 2017-07-09 NOTE — Progress Notes (Signed)
BP 112/70 (BP Location: Left Arm, Patient Position: Sitting, Cuff Size: Normal)   Pulse 64   Temp 97.9 F (36.6 C) (Oral)   Ht 5\' 6"  (1.676 m)   Wt 144 lb (65.3 kg)   SpO2 99%   BMI 23.24 kg/m    CC: AMW f/u visit Subjective:    Patient ID: [name], male    DOB: 1931/06/04, 84 y.o.   MRN: [id number]  HPI: [name] is a 84 y.o. male presenting on 07/09/2017 for Annual Exam (Pt 2. Pt accompanied by wife, Vickii Chafe.) and Sinus Problem (Sinus congestion, drainage- with bad taste and HA.  Says prednisone usually helps. )   Saw Lesia last week for medicare wellness visit. Note reviewed.   Seen here last week with acute sinusitis treated with zpack abx. L sinus pressure is better but endorses chronic headache, chest congestion with productive mucous, and nausea. States ENT said sinuses were clear. Symptoms are worse in the morning when he wakes up coughing up mucous - this happens every morning and leads to marked fatigue and malaise. He is very frustrated with ongoing symptoms without improvement. Has had unrevealing ENT, GI, allergist eval. Last saw [facility] pul[facility] 09/2015.   Saw Dr [name] last week as well for f/u known cardiomyopathy and atrial flutter. Fluids limited to 48oz.   Preventative: Colonoscopy - 2010 Tiffany Kocher) aborted early 2/2 significant diverticulosis. Aged out Prostate cancer - aged out  Flu shot - declines  Pneumococcal vaccine - declines  Td 2011. Declines rpt after recent skin tear zostavax 2008  shingrix - discussed Advanced directive: Wouldn't want prolonged life support. Wife would be medical decision maker. Has defibrillator in place. On [place] registry card - file # [id number].  Seat belt use discussed Sunscreen use discussed. No changing moles on skin.  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 Interim medical history since our last visit reviewed. Allergies and medications reviewed and updated. Outpatient Medications Prior to Visit  Medication Sig Dispense Refill  . acetaminophen (TYLENOL) 500 MG tablet Take 250 mg by mouth daily as needed. Pain    . alum & mag hydroxide-simeth (MAALOX/MYLANTA) 200-200-20 MG/5ML suspension Take 15 mLs by mouth daily as needed for indigestion or heartburn.    . budesonide-formoterol (SYMBICORT) 160-4.5 MCG/ACT inhaler Inhale 2 puffs into the lungs at bedtime.    . carvedilol (COREG) 6.25 MG tablet Take 1 and 1/2 tablets by mouth twice daily 270 tablet 3  . fluticasone (FLONASE) 50 MCG/ACT nasal spray Place 1 spray into both nostrils daily.    . furosemide (LASIX) 40 MG tablet Take 1 tablet (40 mg total) by mouth daily. 90 tablet 3  . guaiFENesin 200 MG tablet Take 400 mg by mouth every 4 (four) hours as needed for cough or to loosen phlegm.    . levalbuterol (XOPENEX HFA) 45 MCG/ACT inhaler Inhale 1-2 puffs into the lungs every 6 (six) hours as needed for wheezing or shortness of breath. 1 Inhaler 1  . Loratadine 10 MG CAPS Take 1 capsule by mouth daily as needed (allergies).     . montelukast (SINGULAIR) 10 MG tablet Take 10 mg by mouth daily as needed (BREATHING).     Marland Kitchen  polyethylene glycol (MIRALAX / GLYCOLAX) packet Take 17 g by mouth daily as needed (constipation).     . vitamin B-12 (CYANOCOBALAMIN) 500 MCG tablet Take 500 mg by mouth daily.    Marland Kitchen warfarin (COUMADIN) 5 MG tablet TAKE AS DIRECTED  BY  ANTICOAGULATION  CLINIC 135 tablet 3  . ALPRAZolam (XANAX) 0.25 MG tablet TAKE 1 TABLET BY MOUTH AT BEDTIME 60 tablet 0  . azithromycin (ZITHROMAX) 250 MG tablet Take two tablets on day one followed by one tablet on days 2-5 6 each 0   No facility-administered  medications prior to visit.      Per HPI unless specifically indicated in ROS section below Review of Systems     Objective:    BP 112/70 (BP Location: Left Arm, Patient Position: Sitting, Cuff Size: Normal)   Pulse 64   Temp 97.9 F (36.6 C) (Oral)   Ht 5\' 6"  (1.676 m)   Wt 144 lb (65.3 kg)   SpO2 99%   BMI 23.24 kg/m   Wt Readings from Last 3 Encounters:  07/09/17 144 lb (65.3 kg)  07/04/17 144 lb 3.2 oz (65.4 kg)  07/03/17 143 lb (64.9 kg)    Physical Exam  Constitutional: He is oriented to person, place, and time. He appears well-developed and well-nourished. No distress.  HENT:  Head: Normocephalic and atraumatic.  Right Ear: Hearing, tympanic membrane, external ear and ear canal normal.  Left Ear: Hearing, tympanic membrane, external ear and ear canal normal.  Nose: Nose normal.  Mouth/Throat: Uvula is midline, oropharynx is clear and moist and mucous membranes are normal. No oropharyngeal exudate, posterior oropharyngeal edema or posterior oropharyngeal erythema.  Improved nasal congestion compared to last year  Eyes: Pupils are equal, round, and reactive to light. Conjunctivae and EOM are normal. No scleral icterus.  Neck: Normal range of motion. Neck supple.  Cardiovascular: Normal rate, regular rhythm, normal heart sounds and intact distal pulses.  No murmur heard. Pulses:      Radial pulses are 2+ on the right side, and 2+ on the left side.  Pulmonary/Chest: Effort normal and breath sounds normal. No respiratory distress. He has no wheezes. He has no rales.  Abdominal: Soft. Bowel sounds are normal. He exhibits no distension and no mass. There is no tenderness. There is no rebound and no guarding.  Musculoskeletal: Normal range of motion. He exhibits no edema.  Lymphadenopathy:    He has no cervical adenopathy.  Neurological: He is alert and oriented to person, place, and time.  CN grossly intact, station and gait intact  Skin: Skin is warm and dry. No rash  noted.  Psychiatric: He has a normal mood and affect. His behavior is normal. Judgment and thought content normal.  Nursing note and vitals reviewed.  Results for orders placed or performed in visit on 07/02/17  CBC with Differential/Platelet  Result Value Ref Range   WBC 6.7 4.0 - 10.5 K/uL   RBC 4.27 4.22 - 5.81 Mil/uL   Hemoglobin 13.0 13.0 - 17.0 g/dL   HCT 38.5 (L) 39.0 - 52.0 %   MCV 90.3 78.0 - 100.0 fl   MCHC 33.6 30.0 - 36.0 g/dL   RDW 15.4 11.5 - 15.5 %   Platelets 129.0 (L) 150.0 - 400.0 K/uL   Neutrophils Relative % 64.4 43.0 - 77.0 %   Lymphocytes Relative 23.1 12.0 - 46.0 %   Monocytes Relative 8.1 3.0 - 12.0 %   Eosinophils Relative 3.3 0.0 - 5.0 %   Basophils  Relative 1.1 0.0 - 3.0 %   Neutro Abs 4.3 1.4 - 7.7 K/uL   Lymphs Abs 1.6 0.7 - 4.0 K/uL   Monocytes Absolute 0.5 0.1 - 1.0 K/uL   Eosinophils Absolute 0.2 0.0 - 0.7 K/uL   Basophils Absolute 0.1 0.0 - 0.1 K/uL  Vitamin B12  Result Value Ref Range   Vitamin B-12 508 211 - 911 pg/mL      Assessment & Plan:   Problem List Items Addressed This Visit    Acute sinusitis    This has largely resolved.       Advanced care planning/counseling discussion   Allergic rhinitis    Chronic, on claritin, flonase, singulair, and guaifenesin.  Has recently seen allergist, ENT, GI with unrevealing cause of ongoing chest congestion. See below.       Anxiety    Chronic, reviewed sparing xanax use. Refilled today.       Relevant Medications   ALPRAZolam (XANAX) 0.25 MG tablet   Chronic systolic heart failure ([place])    Followed by cards - 48oz fluid restriction.       Congestion of respiratory tract - Primary    Sinus congestion better after recent zpack. Has had multiple antibiotic and steroid rounds for waxing and waning infections usually sinus infections over the last few months. States he feels best when on prednisone and levaquin. I don't see obvious signs of infection today so declined abx/steroid at this  time - will offer increased symbicort dose, discussed guaifenesin use (400mg  at a time), and encouraged return to [place] for further eval - referral placed today (last seen 09/2015, cancelled f/u because he was doing so well at that time).       Relevant Orders   Ambulatory referral to Pulmonology   GERD    Cannot tolerate PPIs. Continue maalox, zantac.       Other fatigue   Relevant Orders   Ambulatory referral to Pulmonology   Secondary cardiomyopathy [facility])    [place] [facility]ology care.       Thrombocytopenia (HCC)   Vitamin B12 deficiency    Continue 531mcg daily.           Meds ordered this encounter  Medications  . ALPRAZolam (XANAX) 0.25 MG tablet    Sig: Take 1 tablet (0.25 mg total) by mouth at bedtime.    Dispense:  60 tablet    Refill:  0    This request is for a new prescription for a controlled substance as required by Federal/State law..   Orders Placed This Encounter  Procedures  . Ambulatory referral to Pulmonology    Referral Priority:   Routine    Referral Type:   Consultation    Referral Reason:   Specialty Services Required    Requested Specialty:   Pulmonary Disease    Number of Visits Requested:   1    Follow up plan: Return in about 6 months (around 01/08/2018) for follow up visit.  Ria Bush, MD

## 2017-07-09 NOTE — Assessment & Plan Note (Signed)
Followed by cards - 48oz fluid restriction.

## 2017-07-09 NOTE — Progress Notes (Signed)
I reviewed health advisor's note, was available for consultation, and agree with documentation and plan.  

## 2017-07-09 NOTE — Assessment & Plan Note (Addendum)
Sinus congestion better after recent zpack. Has had multiple antibiotic and steroid rounds for waxing and waning infections usually sinus infections over the last few months. States he feels best when on prednisone and levaquin. I don't see obvious signs of infection today so declined abx/steroid at this time - will offer increased symbicort dose, discussed guaifenesin use (400mg  at a time), and encouraged return to [place] for further eval - referral placed today (last seen 09/2015, cancelled f/u because he was doing so well at that time).

## 2017-07-09 NOTE — Telephone Encounter (Signed)
FYI Pt has appointment with Dr Re[facility] 08/09/17 @ 2:30.  They don't have cancellation list but stated pt can call to see if anyone has canceled

## 2017-07-09 NOTE — Assessment & Plan Note (Signed)
Appreciate cardiology care.  °

## 2017-07-09 NOTE — Telephone Encounter (Signed)
Thanks.  plz call patient - plz ensure he is taking his symbicort 2 puffs daily, would recommend increasing to 2 puffs twice daily while we get in to see pulm.

## 2017-07-09 NOTE — Assessment & Plan Note (Addendum)
Chronic, on claritin, flonase, singulair, and guaifenesin.  Has recently seen allergist, ENT, GI with unrevealing cause of ongoing chest congestion. See below.

## 2017-07-09 NOTE — Telephone Encounter (Signed)
Spoke with pt relaying instructions per Dr. G.  Pt verbalizes understanding.  

## 2017-07-09 NOTE — Patient Instructions (Addendum)
If interested, check with pharmacy about new 2 shot shingles series (shingrix).  Try increasing mucinex to 400mg  every 4-6 hours as needed, drink with glass of water.  Call to schedule follow up with [facility]. See Anastasiya for referral.  Return as needed or in 6 months for follow up visit.  Health Maintenance, Male A healthy lifestyle and preventive care is important for your health and wellness. Ask your health care provider about what schedule of regular examinations is right for you. What should I know about weight and diet? Eat a Healthy Diet  Eat plenty of vegetables, fruits, whole grains, low-fat dairy products, and lean protein.  Do not eat a lot of foods high in solid fats, added sugars, or salt.  Maintain a Healthy Weight Regular exercise can help you achieve or maintain a healthy weight. You should:  Do at least 150 minutes of exercise each week. The exercise should increase your heart rate and make you sweat (moderate-intensity exercise).  Do strength-training exercises at least twice a week.  Watch Your Levels of Cholesterol and Blood Lipids  Have your blood tested for lipids and cholesterol every 5 years starting at 82 years of age. If you are at high risk for heart disease, you should start having your blood tested when you are 82 years old. You may need to have your cholesterol levels checked more often if: ? Your lipid or cholesterol levels are high. ? You are older than 82 years of age. ? You are at high risk for heart disease.  What should I know about cancer screening? Many types of cancers can be detected early and may often be prevented. Lung Cancer  You should be screened every year for lung cancer if: ? You are a current smoker who has smoked for at least 30 years. ? You are a former smoker who has quit within the past 15 years.  Talk to your health care provider about your screening options, when you should start screening, and how often you  should be screened.  Colorectal Cancer  Routine colorectal cancer screening usually begins at 82 years of age and should be repeated every 5-10 years until you are 82 years old. You may need to be screened more often if early forms of precancerous polyps or small growths are found. Your health care provider may recommend screening at an earlier age if you have risk factors for colon cancer.  Your health care provider may recommend using home test kits to check for hidden blood in the stool.  A small camera at the end of a tube can be used to examine your colon (sigmoidoscopy or colonoscopy). This checks for the earliest forms of colorectal cancer.  Prostate and Testicular Cancer  Depending on your age and overall health, your health care provider may do certain tests to screen for prostate and testicular cancer.  Talk to your health care provider about any symptoms or concerns you have about testicular or prostate cancer.  Skin Cancer  Check your skin from head to toe regularly.  Tell your health care provider about any new moles or changes in moles, especially if: ? There is a change in a mole's size, shape, or color. ? You have a mole that is larger than a pencil eraser.  Always use sunscreen. Apply sunscreen liberally and repeat throughout the day.  Protect yourself by wearing long sleeves, pants, a wide-brimmed hat, and sunglasses when outside.  What should I know about heart disease, diabetes,  and high blood pressure?  If you are 37-20 years of age, have your blood pressure checked every 3-5 years. If you are 52 years of age or older, have your blood pressure checked every year. You should have your blood pressure measured twice-once when you are at a hospital or clinic, and once when you are not at a hospital or clinic. Record the average of the two measurements. To check your blood pressure when you are not at a hospital or clinic, you can use: ? An automated blood pressure  machine at a pharmacy. ? A home blood pressure monitor.  Talk to your health care provider about your target blood pressure.  If you are between 47-66 years old, ask your health care provider if you should take aspirin to prevent heart disease.  Have regular diabetes screenings by checking your fasting blood sugar level. ? If you are at a normal weight and have a low risk for diabetes, have this test once every three years after the age of 14. ? If you are overweight and have a high risk for diabetes, consider being tested at a younger age or more often.  A one-time screening for abdominal aortic aneurysm (AAA) by ultrasound is recommended for men aged 68-75 years who are current or former smokers. What should I know about preventing infection? Hepatitis B If you have a higher risk for hepatitis B, you should be screened for this virus. Talk with your health care provider to find out if you are at risk for hepatitis B infection. Hepatitis C Blood testing is recommended for:  Everyone born from [birth year] through 1963.  Anyone with known risk factors for hepatitis C.  Sexually Transmitted Diseases (STDs)  You should be screened each year for STDs including gonorrhea and chlamydia if: ? You are sexually active and are younger than 82 years of age. ? You are older than 82 years of age and your health care provider tells you that you are at risk for this type of infection. ? Your sexual activity has changed since you were last screened and you are at an increased risk for chlamydia or gonorrhea. Ask your health care provider if you are at risk.  Talk with your health care provider about whether you are at high risk of being infected with HIV. Your health care provider may recommend a prescription medicine to help prevent HIV infection.  What else can I do?  Schedule regular health, dental, and eye exams.  Stay current with your vaccines (immunizations).  Do not use any tobacco products,  such as cigarettes, chewing tobacco, and e-cigarettes. If you need help quitting, ask your health care provider.  Limit alcohol intake to no more than 2 drinks per day. One drink equals 12 ounces of beer, 5 ounces of wine, or 1 ounces of hard liquor.  Do not use street drugs.  Do not share needles.  Ask your health care provider for help if you need support or information about quitting drugs.  Tell your health care provider if you often feel depressed.  Tell your health care provider if you have ever been abused or do not feel safe at home. This information is not intended to replace advice given to you by your health care provider. Make sure you discuss any questions you have with your health care provider. Document Released: 09/22/2007 Document Revised: 11/23/2015 Document Reviewed: 12/28/2014 Elsevier Interactive Patient Education  [employer].

## 2017-07-09 NOTE — Assessment & Plan Note (Signed)
This has largely resolved.  

## 2017-07-09 NOTE — Assessment & Plan Note (Addendum)
Cannot tolerate PPIs. Continue maalox, zantac.

## 2017-07-09 NOTE — Assessment & Plan Note (Signed)
Chronic, reviewed sparing xanax use. Refilled today.

## 2017-07-10 ENCOUNTER — Ambulatory Visit (INDEPENDENT_AMBULATORY_CARE_PROVIDER_SITE_OTHER): Payer: Medicare Other

## 2017-07-10 DIAGNOSIS — I4892 Unspecified atrial flutter: Secondary | ICD-10-CM | POA: Diagnosis not present

## 2017-07-10 DIAGNOSIS — Z7901 Long term (current) use of anticoagulants: Secondary | ICD-10-CM | POA: Diagnosis not present

## 2017-07-10 DIAGNOSIS — I483 Typical atrial flutter: Secondary | ICD-10-CM

## 2017-07-10 DIAGNOSIS — Z5181 Encounter for therapeutic drug level monitoring: Secondary | ICD-10-CM

## 2017-07-10 LAB — POCT INR: INR: 2.6

## 2017-07-10 NOTE — Patient Instructions (Signed)
Please continue current dosage of 5 mg daily except 2.5 mg on Mondays. Please be consistent with your greens intake. Recheck in 4 weeks.

## 2017-07-12 ENCOUNTER — Ambulatory Visit (INDEPENDENT_AMBULATORY_CARE_PROVIDER_SITE_OTHER): Payer: Medicare Other | Admitting: Internal Medicine

## 2017-07-12 ENCOUNTER — Encounter: Payer: Self-pay | Admitting: Internal Medicine

## 2017-07-12 VITALS — BP 116/67 | HR 71 | Ht 66.0 in | Wt 145.0 lb

## 2017-07-12 DIAGNOSIS — I5022 Chronic systolic (congestive) heart failure: Secondary | ICD-10-CM | POA: Diagnosis not present

## 2017-07-12 DIAGNOSIS — I4821 Permanent atrial fibrillation: Secondary | ICD-10-CM

## 2017-07-12 DIAGNOSIS — I482 Chronic atrial fibrillation: Secondary | ICD-10-CM | POA: Diagnosis not present

## 2017-07-12 DIAGNOSIS — Z95810 Presence of automatic (implantable) cardiac defibrillator: Secondary | ICD-10-CM | POA: Diagnosis not present

## 2017-07-12 DIAGNOSIS — I428 Other cardiomyopathies: Secondary | ICD-10-CM | POA: Diagnosis not present

## 2017-07-12 LAB — CUP PACEART INCLINIC DEVICE CHECK
Battery Remaining Longevity: 54 mo
Battery Voltage: 2.96 V
Brady Statistic AP VP Percent: 0 %
Brady Statistic AP VS Percent: 0 %
Brady Statistic AS VP Percent: 96.5 %
Brady Statistic AS VS Percent: 3.5 %
Brady Statistic RA Percent Paced: 0 %
Brady Statistic RV Percent Paced: 96.87 %
Date Time Interrogation Session: 20190405165247
HighPow Impedance: 39 Ohm
HighPow Impedance: 51 Ohm
Implantable Lead Implant Date: 20070525
Implantable Lead Implant Date: 20070525
Implantable Lead Implant Date: 20070525
Implantable Lead Location: 753858
Implantable Lead Location: 753859
Implantable Lead Location: 753860
Implantable Lead Model: 4194
Implantable Lead Model: 5076
Implantable Lead Model: 6949
Implantable Pulse Generator Implant Date: 20170116
Lead Channel Impedance Value: 190 Ohm
Lead Channel Impedance Value: 304 Ohm
Lead Channel Impedance Value: 342 Ohm
Lead Channel Impedance Value: 399 Ohm
Lead Channel Impedance Value: 456 Ohm
Lead Channel Impedance Value: 475 Ohm
Lead Channel Pacing Threshold Amplitude: 1 V
Lead Channel Pacing Threshold Amplitude: 1 V
Lead Channel Pacing Threshold Pulse Width: 0.4 ms
Lead Channel Pacing Threshold Pulse Width: 0.4 ms
Lead Channel Sensing Intrinsic Amplitude: 6.25 mV
Lead Channel Setting Pacing Amplitude: 2.5 V
Lead Channel Setting Pacing Amplitude: 2.5 V
Lead Channel Setting Pacing Pulse Width: 0.4 ms
Lead Channel Setting Pacing Pulse Width: 0.4 ms
Lead Channel Setting Sensing Sensitivity: 0.45 mV

## 2017-07-12 NOTE — Patient Instructions (Addendum)
Medication Instructions:  Your physician recommends that you continue on your current medications as directed. Please refer to the Current Medication list given to you today.  Labwork: None ordered.  Testing/Procedures: None ordered.  Follow-Up: Your physician wants you to follow-up in: one year with Dr. Lovena Le.   You will receive a reminder letter in the mail two months in advance. If you don't receive a letter, please call our office to schedule the follow-up appointment.  Remote monitoring is used to monitor your ICD from home. This monitoring reduces the number of office visits required to check your device to one time per year. It allows [place] to keep an eye on the functioning of your device to ensure it is working properly. You are scheduled for a device check from home on 09/23/2017. You may send your transmission at any time that day. If you have a wireless device, the transmission will be sent automatically. After your physician reviews your transmission, you will receive a postcard with your next transmission date.  Any Other Special Instructions Will Be Listed Below (If Applicable).  If you need a refill on your cardiac medications before your next appointment, please call your pharmacy.

## 2017-07-14 ENCOUNTER — Encounter: Payer: Self-pay | Admitting: Internal Medicine

## 2017-07-14 NOTE — Progress Notes (Addendum)
HPI [name] returns for ongoing evaluation and management of his ICD in the setting of CHB, atrial fib/LA flutter, and chronic systolic heart failure. He has gradually had worsening heart failure. He denies anginal symptoms. No edema.    Current Outpatient Medications  Medication Sig Dispense Refill  . acetaminophen (TYLENOL) 500 MG tablet Take 250 mg by mouth daily as needed. Pain    . ALPRAZolam (XANAX) 0.25 MG tablet Take 1 tablet (0.25 mg total) by mouth at bedtime. 60 tablet 0  . alum & mag hydroxide-simeth (MAALOX/MYLANTA) 200-200-20 MG/5ML suspension Take 15 mLs by mouth daily as needed for indigestion or heartburn.    . budesonide-formoterol (SYMBICORT) 160-4.5 MCG/ACT inhaler Inhale 2 puffs into the lungs at bedtime.    . carvedilol (COREG) 6.25 MG tablet Take 1 and 1/2 tablets by mouth twice daily 270 tablet 3  . fluticasone (FLONASE) 50 MCG/ACT nasal spray Place 1 spray into both nostrils daily.    . furosemide (LASIX) 40 MG tablet Take 1 tablet (40 mg total) by mouth daily. 90 tablet 3  . guaiFENesin 200 MG tablet Take 400 mg by mouth every 4 (four) hours as needed for cough or to loosen phlegm.    . levalbuterol (XOPENEX HFA) 45 MCG/ACT inhaler Inhale 1-2 puffs into the lungs every 6 (six) hours as needed for wheezing or shortness of breath. 1 Inhaler 1  . Loratadine 10 MG CAPS Take 1 capsule by mouth daily as needed (allergies).     . montelukast (SINGULAIR) 10 MG tablet Take 10 mg by mouth daily as needed (BREATHING).     . polyethylene glycol (MIRALAX / GLYCOLAX) packet Take 17 g by mouth daily as needed (constipation).     . vitamin B-12 (CYANOCOBALAMIN) 500 MCG tablet Take 500 mg by mouth daily.    Marland Kitchen warfarin (COUMADIN) 5 MG tablet TAKE AS DIRECTED  BY  ANTICOAGULATION  CLINIC 135 tablet 3   No current facility-administered medications for this visit.      Past Medical History:  Diagnosis Date  . AICD (automatic cardioverter/defibrillator) present    s/p  gen change 04/2015 w/ MDT [name] Crt-D  DTBA1D1, Serial number [id number]  . Allergic rhinitis    [name]  . Anemia   . Anxiety   . Arthritis   . Atrial flutter ([place])    A.  Status post cardioversion; B.  Tikosyn therapy - failed, remains in aflutter  . Bilateral pneumonia 11/02/2013   treated with levaquin  . BPH (benign prostatic hyperplasia)   . 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 Lucky Cowboy)  . Chronic sinusitis   . Chronic systolic congestive heart failure ([place])   . COPD (chronic obstructive pulmonary disease) ([place])    spirometry 2015 - no obstruction, + mild restrictive lung disease  . Extrinsic asthma    [name]  . Fall with injury 05/28/2017  . GERD (gastroesophageal reflux disease) 2010   h/o esophageal stricture with dilation, LA grade C reflux esophagitis by EGD 2010  . Goiter   . History of diverticulitis of colon   . History of GI bleed    Secondary to hemorrhoids  . IBS (irritable bowel syndrome)   . LBBB (left bundle branch block)   . Multiple pulmonary nodules 06/2013   RUL ([facility]) - ?vasculitis as of last CT at [facility]  . NICM (nonischemic cardiomyopathy) [facility])    Cardiac catheterization March 2006  without coronary disease; EF 25% 2018 Jan  . [facility])   . Sinus congestion 09/25/2010    ROS:   All systems reviewed and negative except as noted in the HPI.   Past Surgical History:  Procedure Laterality Date  . BACK SURGERY  1997   bulging disks  . BiV-AICD implant     Medtronic  . CARDIAC CATHETERIZATION  06/22/04   Severe, nonischemic cardiomyopathy,EF 25-30%  . CARDIOVERSION  02/22/09   AFlutter-([facility])  . CHOLECYSTECTOMY    . COLONOSCOPY  10/99   Divertic, splenic, hepatic fleure only  . COLONOSCOPY  10/21/08   aborted-divertics, int hemms (Dr. Vira Agar)  . CORONARY ANGIOPLASTY    . EP IMPLANTABLE DEVICE N/A 04/25/2015   Procedure: BIV ICD Generator Changeout;  Surgeon: Evans Lance, MD;   Location: [place] [facility];  Service: Cardiovascular;  Laterality: N/A;  . ESOPHAGEAL DILATION  02/18/98  . ESOPHAGOGASTRODUODENOSCOPY  01/1998   stricture, sliding HH, GERD  . ESOPHAGOGASTRODUODENOSCOPY  04/08/01   stricture, gastritis, HH, GERD-no dilation(Dr. Henrene Pastor)  . ESOPHAGOGASTRODUODENOSCOPY  12/22/04   stricture; gastritis; duodenitis, GERD  . ESOPHAGOGASTRODUODENOSCOPY  10/21/08   Reflux esophagitis; Erythem. Duod. (Dr. Vira Agar)  . ESOPHAGOGASTRODUODENOSCOPY  08/2013   erythema gastric fundus - minimal gastritis, HH ([place])  . ESOPHAGOGASTRODUODENOSCOPY  08/2016   dysphagia - mod schatzki rin, dilated Tiffany Kocher)  . ESOPHAGOGASTRODUODENOSCOPY (EGD) WITH PROPOFOL N/A 08/24/2016   mod schatzki ring dilated, duodenal deformity Vira Agar, Gavin Pound, MD)  . goiter removal    . HERNIA REPAIR    . HERNIA REPAIR  01/30/02   Dr. Hassell Done  . INSERT / REPLACE / REMOVE PACEMAKER    . NOSE SURGERY  1971  . PENILE PROSTHESIS IMPLANT  2007   Otelin  . PFT  10/2013   FVC 61%, FEV1 63%, ratio 0.76  . Pulmonary eval  04/2002   ([facility]) Chronic congestive symptoms  . REVERSE SHOULDER ARTHROPLASTY Right 01/17/2016   Procedure: REVERSE SHOULDER ARTHROPLASTY;  Surgeon: Corky Mull, MD;  Location: [facility] ORS;  Service: Orthopedics;  Laterality: Right;  . REVERSE TOTAL SHOULDER ARTHROPLASTY Right 01/2016   Poggi  . US ECHOCARDIOGRAPHY  07/13/04   EF 25-30%, Mod LVH; LA severe dilation; Mild AR; IRTR  . US ECHOCARDIOGRAPHY  11/27/06   hypokinesis posterior wall, EF 35%; mild AR     Family History  Problem Relation Age of Onset  . Coronary artery disease Father   . Hypertension Father   . Heart failure Mother   . Dysphagia Sister   . Breast cancer Unknown   . Ovarian cancer Unknown   . Uterine cancer Unknown   . Other Sister        stomach problems  . Other Sister        stomach problems  . Other Sister        stomach problems  . Alcohol abuse Maternal Uncle      Social History   Socioeconomic  History  . Marital status: Married    Spouse name: Not on file  . Number of children: 3  . Years of education: Not on file  . Highest education level: Not on file  Occupational History  . Occupation: retired    Fish farm manager: RETIRED  Social Needs  . Financial resource strain: Not on file  . Food insecurity:    Worry: Not on file    Inability: Not on file  . Transportation needs:    Medical: Not on file    Non-medical:  Not on file  Tobacco Use  . Smoking status: Never Smoker  . Smokeless tobacco: Never Used  Substance and Sexual Activity  . Alcohol use: No  . Drug use: No  . Sexual activity: Never  Lifestyle  . Physical activity:    Days per week: Not on file    Minutes per session: Not on file  . Stress: Not on file  Relationships  . Social connections:    Talks on phone: Not on file    Gets together: Not on file    Attends religious service: Not on file    Active member of club or organization: Not on file    Attends meetings of clubs or organizations: Not on file    Relationship status: Not on file  . Intimate partner violence:    Fear of current or ex partner: Not on file    Emotionally abused: Not on file    Physically abused: Not on file    Forced sexual activity: Not on file  Other Topics Concern  . Not on file  Social History Narrative   Lives with wife   3 children-8 grandchildren   Still works on a farm.   Retired from [employer] friend with Dr. Jefm Bryant   Activity: No regular exercise   Diet: good water, fruits/vegetables daily     BP 116/67   Pulse 71   Ht 5\' 6"  (1.676 m)   Wt 145 lb (65.8 kg)   BMI 23.40 kg/m   Physical Exam:  elderly appearing NAD HEENT: Unremarkable Neck:  6 cm JVD, no thyromegally Lymphatics:  No adenopathy Back:  No CVA tenderness Lungs:  Clear with no wheezes HEART:  Regular rate rhythm, no murmurs, no rubs, no clicks Abd:  soft, positive bowel sounds, no organomegally, no rebound, no  guarding Ext:  2 plus pulses, no edema, no cyanosis, no clubbing Skin:  No rashes no nodules Neuro:  CN II through XII intact, motor grossly intact  EKG - atrial fib/flutter with biv pacing  DEVICE  Normal device function.  See PaceArt for details.   Assess/Plan: 1. Chronic systolic heart failure - his symptoms are class 2B. I have encouraged him to remain active.  2. ICD - his BiV ICD has had an adjustment in his LV/RV timing. HIs device is working normally. 3. Atrial fib/flutter - his ventricular rate is controlled. He will continue anti-coaulation. 4. CHB - he is now device dependent. He is asymptomatic with biv pacing.   Mikle Bosworth.D.

## 2017-07-19 NOTE — Addendum Note (Signed)
Addended by: Marlis Edelson C on: 07/19/2017 10:59 AM   Modules accepted: Orders

## 2017-07-24 ENCOUNTER — Other Ambulatory Visit: Payer: Self-pay | Admitting: Pharmacist Clinician (PhC)/ Clinical Pharmacy Specialist

## 2017-07-24 MED ORDER — WARFARIN SODIUM 5 MG PO TABS: ORAL_TABLET | ORAL | 0 refills | Status: DC

## 2017-08-02 ENCOUNTER — Other Ambulatory Visit: Payer: Self-pay | Admitting: Pharmacist

## 2017-08-02 MED ORDER — WARFARIN SODIUM 5 MG PO TABS: ORAL_TABLET | ORAL | 0 refills | Status: DC

## 2017-08-07 ENCOUNTER — Ambulatory Visit (INDEPENDENT_AMBULATORY_CARE_PROVIDER_SITE_OTHER): Payer: Medicare Other

## 2017-08-07 DIAGNOSIS — I4892 Unspecified atrial flutter: Secondary | ICD-10-CM

## 2017-08-07 DIAGNOSIS — Z7901 Long term (current) use of anticoagulants: Secondary | ICD-10-CM | POA: Diagnosis not present

## 2017-08-07 DIAGNOSIS — I483 Typical atrial flutter: Secondary | ICD-10-CM | POA: Diagnosis not present

## 2017-08-07 LAB — POCT INR: INR: 3

## 2017-08-07 NOTE — Patient Instructions (Signed)
Please continue current dosage of 5 mg daily except 2.5 mg on Mondays. Please be consistent with your greens intake. Recheck in 5 weeks.

## 2017-08-09 DIAGNOSIS — R05 Cough: Secondary | ICD-10-CM | POA: Diagnosis not present

## 2017-08-26 ENCOUNTER — Encounter: Payer: Self-pay | Admitting: Cardiology

## 2017-08-26 DIAGNOSIS — L570 Actinic keratosis: Secondary | ICD-10-CM | POA: Diagnosis not present

## 2017-08-26 DIAGNOSIS — X32XXXA Exposure to sunlight, initial encounter: Secondary | ICD-10-CM | POA: Diagnosis not present

## 2017-08-26 DIAGNOSIS — D044 Carcinoma in situ of skin of scalp and neck: Secondary | ICD-10-CM | POA: Diagnosis not present

## 2017-08-28 DIAGNOSIS — R1013 Epigastric pain: Secondary | ICD-10-CM | POA: Diagnosis not present

## 2017-08-28 DIAGNOSIS — G8929 Other chronic pain: Secondary | ICD-10-CM | POA: Diagnosis not present

## 2017-09-11 ENCOUNTER — Ambulatory Visit (INDEPENDENT_AMBULATORY_CARE_PROVIDER_SITE_OTHER): Payer: Medicare Other

## 2017-09-11 DIAGNOSIS — Z7901 Long term (current) use of anticoagulants: Secondary | ICD-10-CM | POA: Diagnosis not present

## 2017-09-11 DIAGNOSIS — I4892 Unspecified atrial flutter: Secondary | ICD-10-CM | POA: Diagnosis not present

## 2017-09-11 DIAGNOSIS — I483 Typical atrial flutter: Secondary | ICD-10-CM | POA: Diagnosis not present

## 2017-09-11 LAB — POCT INR: INR: 1.8 — AB (ref 2.0–3.0)

## 2017-09-11 NOTE — Patient Instructions (Signed)
Please take 1.5 tablets today, then continue current dosage of 5 mg daily except 2.5 mg on Mondays. Please be consistent with your greens intake. Recheck in 5 weeks.

## 2017-09-16 NOTE — Progress Notes (Addendum)
HPI [name] presents for follow up of his cardiomyopathy and atrial flutter.  EF on the last echo that I ordered in Jan was 20 - 25%.   He was doing better when I saw him last.  Since then he has seen Dr. Lovena Le.  His biggest issue has been nausea.  He is seeing GI for this.  He is being treated for probable reflux.  He does have weakness and fatigue that is slowly progressive.  He has dyspnea with exertion which is better since we increased his Lasix earlier this year.  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  He is working with his partner who is 82 years old.  He is not having any chest pressure, neck or arm discomfort.  He had no presyncope or syncope.  He is not describing PND or orthopnea.   Allergies  Allergen Reactions  . Clarithromycin Nausea Only  . Famotidine Other (See Comments)    ABD. PAIN  . Levaquin [Levofloxacin] Other (See Comments)    Stomach pain, has to eat a lot of food  . Omeprazole Diarrhea  . Oxytetracycline Other (See Comments)    BUMPS  . Penicillins Swelling      . Proton Pump Inhibitors Other (See Comments)    GI upset, diarrhea, gas, bloating  . Ranitidine Diarrhea  . Doxycycline Rash    Current Outpatient Medications  Medication Sig Dispense Refill  . acetaminophen (TYLENOL) 500 MG tablet Take 250 mg by mouth daily as needed. Pain    . ALPRAZolam (XANAX) 0.25 MG tablet Take 1 tablet (0.25 mg total) by mouth at bedtime. 60 tablet 0  . alum & mag hydroxide-simeth (MAALOX/MYLANTA) 200-200-20 MG/5ML suspension Take 15 mLs by mouth daily as needed for indigestion or heartburn.    . budesonide-formoterol (SYMBICORT) 160-4.5 MCG/ACT inhaler Inhale 2 puffs into the lungs at bedtime.    . carvedilol (COREG) 6.25 MG tablet Take 1 and 1/2 tablets by mouth twice daily 270 tablet 3  . fluticasone (FLONASE) 50 MCG/ACT nasal spray Place 1 spray into both nostrils daily.    . furosemide (LASIX) 40 MG tablet Take 1 tablet (40 mg  total) by mouth daily. 90 tablet 3  . guaiFENesin 200 MG tablet Take 400 mg by mouth every 4 (four) hours as needed for cough or to loosen phlegm.    . levalbuterol (XOPENEX HFA) 45 MCG/ACT inhaler Inhale 1-2 puffs into the lungs every 6 (six) hours as needed for wheezing or shortness of breath. 1 Inhaler 1  . Loratadine 10 MG CAPS Take 1 capsule by mouth daily as needed (allergies).     . montelukast (SINGULAIR) 10 MG tablet Take 10 mg by mouth daily as needed (BREATHING).     . polyethylene glycol (MIRALAX / GLYCOLAX) packet Take 17 g by mouth daily as needed (constipation).     . vitamin B-12 (CYANOCOBALAMIN) 500 MCG tablet Take 500 mg by mouth daily.    Marland Kitchen warfarin (COUMADIN) 5 MG tablet Take 1/2 to 1 tablet by mouth daily as directed by coumadin clinic 135 tablet 0   No current facility-administered medications for this visit.     Past Medical History:  Diagnosis Date  . AICD (automatic cardioverter/defibrillator) present    s/p gen change 04/2015 w/ MDT [name] Crt-D  DTBA1D1, Serial number [id number]  . Allergic rhinitis    [name]  . Anemia   . Anxiety   . Arthritis   .  Atrial flutter ([place])    A.  Status post cardioversion; B.  Tikosyn therapy - failed, remains in aflutter  . Bilateral pneumonia 11/02/2013   treated with levaquin  . BPH (benign prostatic hyperplasia)   . 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 Lucky Cowboy)  . Chronic sinusitis   . Chronic systolic congestive heart failure ([place])   . COPD (chronic obstructive pulmonary disease) ([place])    spirometry 2015 - no obstruction, + mild restrictive lung disease  . Extrinsic asthma    [name]  . Fall with injury 05/28/2017  . GERD (gastroesophageal reflux disease) 2010   h/o esophageal stricture with dilation, LA grade C reflux esophagitis by EGD 2010  . Goiter   . History of diverticulitis of colon   . History of GI bleed    Secondary to hemorrhoids  . IBS (irritable bowel syndrome)   . LBBB (left  bundle branch block)   . Multiple pulmonary nodules 06/2013   RUL ([facility]) - ?vascul[facility] as of [facility]  . NICM (nonischemic cardiomyopathy) [facility])    Cardiac catheterization March 2006 without coronary disease; EF 25% 2018 Jan  . [facility])   . Sinus congestion 09/25/2010    Past Surgical History:  Procedure Laterality Date  . BACK SURGERY  1997   bulging disks  . BiV-AICD implant     Medtronic  . CARDIAC CATHETERIZATION  06/22/04   Severe, nonischemic cardiomyopathy,EF 25-30%  . CARDIOVERSION  02/22/09   AFlutter-([facility])  . CHOLECYSTECTOMY    . COLONOSCOPY  10/99   Divertic, splenic, hepatic fleure only  . COLONOSCOPY  10/21/08   aborted-divertics, int hemms (Dr. Vira Agar)  . CORONARY ANGIOPLASTY    . EP IMPLANTABLE DEVICE N/A 04/25/2015   Procedure: BIV ICD Generator Changeout;  Surgeon: Evans Lance, MD;  Location: [place] [facility];  Service: Cardiovascular;  Laterality: N/A;  . ESOPHAGEAL DILATION  02/18/98  . ESOPHAGOGASTRODUODENOSCOPY  01/1998   stricture, sliding HH, GERD  . ESOPHAGOGASTRODUODENOSCOPY  04/08/01   stricture, gastritis, HH, GERD-no dilation(Dr. Henrene Pastor)  . ESOPHAGOGASTRODUODENOSCOPY  12/22/04   stricture; gastritis; duodenitis, GERD  . ESOPHAGOGASTRODUODENOSCOPY  10/21/08   Reflux esophagitis; Erythem. Duod. (Dr. Vira Agar)  . ESOPHAGOGASTRODUODENOSCOPY  08/2013   erythema gastric fundus - minimal gastritis, HH ([place])  . ESOPHAGOGASTRODUODENOSCOPY  08/2016   dysphagia - mod schatzki rin, dilated Tiffany Kocher)  . ESOPHAGOGASTRODUODENOSCOPY (EGD) WITH PROPOFOL N/A 08/24/2016   mod schatzki ring dilated, duodenal deformity Vira Agar, Gavin Pound, MD)  . goiter removal    . HERNIA REPAIR    . HERNIA REPAIR  01/30/02   Dr. Hassell Done  . INSERT / REPLACE / REMOVE PACEMAKER    . NOSE SURGERY  1971  . PENILE PROSTHESIS IMPLANT  2007   Otelin  . PFT  10/2013   FVC 61%, FEV1 63%, ratio 0.76  . Pulmonary eval  04/2002   ([facility]) Chronic  congestive symptoms  . REVERSE SHOULDER ARTHROPLASTY Right 01/17/2016   Procedure: REVERSE SHOULDER ARTHROPLASTY;  Surgeon: Corky Mull, MD;  Location: [facility] ORS;  Service: Orthopedics;  Laterality: Right;  . REVERSE TOTAL SHOULDER ARTHROPLASTY Right 01/2016   Poggi  . US ECHOCARDIOGRAPHY  07/13/04   EF 25-30%, Mod LVH; LA severe dilation; Mild AR; IRTR  . US ECHOCARDIOGRAPHY  11/27/06   hypokinesis posterior wall, EF 35%; mild AR    ROS:  As stated in the HPI and negative for all other  systems.    PHYSICAL EXAM BP 116/77   Pulse 77   Ht 5\' 6"  (1.676 m)   Wt 143 lb 12.8 oz (65.2 kg)   SpO2 96%   BMI 23.21 kg/m   GENERAL:  Somewhat frail appearing NECK:  No jugular venous distention, waveform within normal limits, carotid upstroke brisk and symmetric, no bruits, no thyromegaly LUNGS:  Clear to auscultation bilaterally CHEST:  Well healed CRT pocket.  HEART:  PMI not displaced or sustained,S1 and S2 within normal limits, no S3, no S4, no clicks, no rubs, 2 out of 6 systolic murmur radiating out the aortic outflow tract, 2 out of 6 diastolic murmur heard at the third left interspace ABD:  Flat, positive bowel sounds normal in frequency in pitch, no bruits, no rebound, no guarding, no midline pulsatile mass, no hepatomegaly, no splenomegaly EXT:  2 plus pulses throughout, mild edema, no cyanosis no clubbing  SATURATION QUALIFICATIONS: (This note is used to comply with regulatory documentation for home oxygen)  Patient Saturations on Room Air at Rest = 91%  Patient Saturations on Room Air while Ambulating = 83%  Patient Saturations on 2 Liters of oxygen while Ambulating = 88 - 89%  Patient Saturations on 3 Liters of oxygen while Ambulating = 92%  Patient Saturations on 3 Liters of oxygen while Sitting = 98%  Sitting after 5 min without O2 92%.    Please briefly explain why patient needs home oxygen:   He has symptomatic hypoxemia secondary to class III CHF and pulmonary HTN.    EKG:  NA    09/17/2017  ASSESSMENT AND PLAN  Nonischemic cardiomyopathy -  He has progressive congestive heart failure with class IIb symptoms.  I talked to him about the possibility of heart catheterization eventually even inotropic therapy but they do not want to pursue this.  They want only medical management and conservative therapies.  I had a discussion with his wife today that he is nearing end-stage heart failure.  In fact his oxygen saturation fell as above and with a try to get him home oxygen.  We talked about hospice care and is not yet ready for that.  However he is very clear that he does not want to think about advanced therapies.  He has not tolerated med titration in the past.  Therefore, would be to pursue comfort measures with his minimal therapy and fluid management.  Atrial flutter -   Mr. Jaris B Myer has a CHA2DS2 - VASc score of 3. He will continue with current therapy.   Pulmonary HTN - I had a long discussion with the patient had his wife.  See above.

## 2017-09-17 ENCOUNTER — Ambulatory Visit (INDEPENDENT_AMBULATORY_CARE_PROVIDER_SITE_OTHER): Payer: Medicare Other | Admitting: Cardiology

## 2017-09-17 ENCOUNTER — Encounter: Payer: Self-pay | Admitting: Cardiology

## 2017-09-17 VITALS — BP 116/77 | HR 77 | Ht 66.0 in | Wt 143.8 lb

## 2017-09-17 DIAGNOSIS — I4892 Unspecified atrial flutter: Secondary | ICD-10-CM

## 2017-09-17 DIAGNOSIS — I5023 Acute on chronic systolic (congestive) heart failure: Secondary | ICD-10-CM

## 2017-09-17 DIAGNOSIS — I272 Pulmonary hypertension, unspecified: Secondary | ICD-10-CM

## 2017-09-17 NOTE — Patient Instructions (Signed)
Medication Instructions:  Continue current medications  If you need a refill on your cardiac medications before your next appointment, please call your pharmacy.  Labwork: None Ordered   Testing/Procedures: None Ordered  Follow-Up: Your physician wants you to follow-up in: 3 Months.     Thank you for choosing [facility]!!       

## 2017-09-23 ENCOUNTER — Telehealth: Payer: Self-pay | Admitting: Cardiology

## 2017-09-23 ENCOUNTER — Ambulatory Visit (INDEPENDENT_AMBULATORY_CARE_PROVIDER_SITE_OTHER): Payer: Medicare Other | Admitting: *Deleted

## 2017-09-23 DIAGNOSIS — I428 Other cardiomyopathies: Secondary | ICD-10-CM | POA: Diagnosis not present

## 2017-09-23 DIAGNOSIS — I5022 Chronic systolic (congestive) heart failure: Secondary | ICD-10-CM

## 2017-09-23 DIAGNOSIS — R0602 Shortness of breath: Secondary | ICD-10-CM

## 2017-09-23 NOTE — Telephone Encounter (Signed)
Spoke with pt and reminded pt of remote transmission that is due today. Pt verbalized understanding.   

## 2017-09-23 NOTE — Telephone Encounter (Signed)
New message   Patient's spouse calling to follow up on order for oxygen, discussed in last office visit.

## 2017-09-23 NOTE — Telephone Encounter (Signed)
Routed to primary Nya CMA to follow up on home oxygen orders

## 2017-09-23 NOTE — Telephone Encounter (Signed)
Order for Oxygen put in epic  Message send to [facility] via epic staff

## 2017-09-24 ENCOUNTER — Encounter: Payer: Self-pay | Admitting: Cardiology

## 2017-09-24 ENCOUNTER — Telehealth: Payer: Self-pay | Admitting: Cardiology

## 2017-09-24 LAB — CUP PACEART REMOTE DEVICE CHECK
Battery Remaining Longevity: 42 mo
Battery Voltage: 2.95 V
Brady Statistic AP VP Percent: 0 %
Brady Statistic AP VS Percent: 0 %
Brady Statistic AS VP Percent: 93.86 %
Brady Statistic AS VS Percent: 6.14 %
Brady Statistic RA Percent Paced: 0 %
Brady Statistic RV Percent Paced: 95.31 %
Date Time Interrogation Session: 20190618032606
HighPow Impedance: 36 Ohm
HighPow Impedance: 47 Ohm
Implantable Lead Implant Date: 20070525
Implantable Lead Implant Date: 20070525
Implantable Lead Implant Date: 20070525
Implantable Lead Location: 753858
Implantable Lead Location: 753859
Implantable Lead Location: 753860
Implantable Lead Model: 4194
Implantable Lead Model: 5076
Implantable Lead Model: 6949
Implantable Pulse Generator Implant Date: 20170116
Lead Channel Impedance Value: 171 Ohm
Lead Channel Impedance Value: 304 Ohm
Lead Channel Impedance Value: 361 Ohm
Lead Channel Impedance Value: 399 Ohm
Lead Channel Impedance Value: 456 Ohm
Lead Channel Impedance Value: 456 Ohm
Lead Channel Pacing Threshold Amplitude: 0.75 V
Lead Channel Pacing Threshold Amplitude: 1 V
Lead Channel Pacing Threshold Pulse Width: 0.4 ms
Lead Channel Pacing Threshold Pulse Width: 0.4 ms
Lead Channel Sensing Intrinsic Amplitude: 14.25 mV
Lead Channel Sensing Intrinsic Amplitude: 14.25 mV
Lead Channel Sensing Intrinsic Amplitude: 5.875 mV
Lead Channel Sensing Intrinsic Amplitude: 5.875 mV
Lead Channel Setting Pacing Amplitude: 2.5 V
Lead Channel Setting Pacing Amplitude: 2.5 V
Lead Channel Setting Pacing Pulse Width: 0.4 ms
Lead Channel Setting Pacing Pulse Width: 0.4 ms
Lead Channel Setting Sensing Sensitivity: 0.45 mV

## 2017-09-24 NOTE — Telephone Encounter (Signed)
Spoke to wife-aware orders were placed yesterday to [facility].    Phone # provided, wife will contact [place] today.  Advised to call back with questions or concerns.

## 2017-09-24 NOTE — Telephone Encounter (Signed)
Returned call to [place] with [facility]:  He states there are 3 issue with home O2 order 1-need liter flow on O2 order (ex: 2L) 2-need O2 sat while ambulating on O2 3-primary diagnosis cannot include "acute" on chronic systolic HF   Primary made aware.

## 2017-09-24 NOTE — Telephone Encounter (Signed)
New Message    Patient wife is calling today , she said she spoke with someone yesterday about getting oxygen for her husband and did not get a response back.  He saw Dr [name] last week and he mention about getting the oxygen but nothing was set up

## 2017-09-24 NOTE — Telephone Encounter (Signed)
Jason/[facility] calling   Need call back to get pt's oxygen order processed though insurance. Please call

## 2017-09-24 NOTE — Progress Notes (Signed)
Remote ICD transmission.   

## 2017-09-24 NOTE — Telephone Encounter (Signed)
Spoke with pt his wife and himself will be coming into office on Thursday to recheck O2 stat on oxygen

## 2017-09-30 NOTE — Telephone Encounter (Signed)
Pt's wife calling to check on status of order for home oxygen- pls advise [phone]

## 2017-10-01 NOTE — Telephone Encounter (Signed)
New message    Pt is calling to follow up on oxygen order. He said he was told he would need oxygen and has not heard anything from anyone. Please call.

## 2017-10-01 NOTE — Telephone Encounter (Signed)
I will forward to Meredith Pel to check on update

## 2017-10-02 NOTE — Telephone Encounter (Signed)
Spoke with pt letting him know we are awaiting Dr [name] signature and will faxed back [facility].

## 2017-10-16 ENCOUNTER — Ambulatory Visit (INDEPENDENT_AMBULATORY_CARE_PROVIDER_SITE_OTHER): Payer: Medicare Other | Admitting: Pharmacist

## 2017-10-16 DIAGNOSIS — I4892 Unspecified atrial flutter: Secondary | ICD-10-CM | POA: Diagnosis not present

## 2017-10-16 DIAGNOSIS — Z7901 Long term (current) use of anticoagulants: Secondary | ICD-10-CM | POA: Diagnosis not present

## 2017-10-16 LAB — POCT INR: INR: 4.3 — AB (ref 2.0–3.0)

## 2017-10-16 NOTE — Patient Instructions (Signed)
Description   Please skip warfarin dose today then take only 1/2 tablet tomorrow, then continue current dosage of 5 mg daily except 2.5 mg on Mondays. Please be consistent with your greens intake. Recheck in 4 weeks (PT request).

## 2017-10-19 ENCOUNTER — Other Ambulatory Visit: Payer: Self-pay | Admitting: Cardiology

## 2017-10-22 ENCOUNTER — Ambulatory Visit
Admission: RE | Admit: 2017-10-22 | Discharge: 2017-10-22 | Disposition: A | Discharge status: Home / Self Care | Payer: Medicare Other | Source: Ambulatory Visit | Attending: Cardiology | Admitting: Cardiology

## 2017-10-22 ENCOUNTER — Telehealth: Payer: Self-pay | Admitting: Cardiology

## 2017-10-22 DIAGNOSIS — R042 Hemoptysis: Secondary | ICD-10-CM | POA: Insufficient documentation

## 2017-10-22 DIAGNOSIS — R918 Other nonspecific abnormal finding of lung field: Secondary | ICD-10-CM | POA: Diagnosis not present

## 2017-10-22 NOTE — Telephone Encounter (Signed)
Returned call to patient of Dr. [name]. He has had lung trouble before and went to [facility] for this. He has been coughing up "strawberry colored" blood. He wakes up "choking" on this in the morning.  Patient would like a referral to a lung specialist in [place]. He saw [facility] on 7/10 for INR check which was high at 4.3 and did not mention to her that he had been having these issues. Stressed importance of notifying staff during these times of bleeding issues, especially when levels are supra-therapeutic. He voiced understanding.   Routed to [facility] and Dr. [name] to advise on bleeding concerns and referral

## 2017-10-22 NOTE — Telephone Encounter (Signed)
Can we please send him for an X ray?  Thanks.

## 2017-10-22 NOTE — Telephone Encounter (Signed)
Patient notified that MD has suggested he have a CXR at [facility]. He was advised to go today to medical mall, 1st desk on [address] to check in

## 2017-10-22 NOTE — Telephone Encounter (Signed)
New Message:    Pt says he was waiting for his referral to a Pulmonary Doctor please. He says he need this asap, he is now coughing up blood.

## 2017-10-22 NOTE — Telephone Encounter (Signed)
Hx of Aflutter; CHADS2-VASc score of 3 with active bleeding.   Please HOLD warfarin today and tomorrow. Should call PCP for immediate assessment  and additional recommendations.

## 2017-10-22 NOTE — Telephone Encounter (Signed)
Patient called with [facility] recommendations. He voiced understanding. He states he has seen ENT and was told the blood is not "coming from his head". He has a GI MD but has not notified their office that he is coughing up blood. He states the blood is mixed with mucous.   Will wait on MD input

## 2017-10-23 ENCOUNTER — Ambulatory Visit (INDEPENDENT_AMBULATORY_CARE_PROVIDER_SITE_OTHER): Payer: Medicare Other

## 2017-10-23 ENCOUNTER — Ambulatory Visit (INDEPENDENT_AMBULATORY_CARE_PROVIDER_SITE_OTHER): Payer: Medicare Other | Admitting: Family Medicine

## 2017-10-23 ENCOUNTER — Encounter: Payer: Self-pay | Admitting: Family Medicine

## 2017-10-23 VITALS — BP 118/62 | HR 72 | Temp 98.2°F | Resp 18 | Wt 140.0 lb

## 2017-10-23 DIAGNOSIS — R9389 Abnormal findings on diagnostic imaging of other specified body structures: Secondary | ICD-10-CM | POA: Diagnosis not present

## 2017-10-23 DIAGNOSIS — I4892 Unspecified atrial flutter: Secondary | ICD-10-CM

## 2017-10-23 LAB — POCT INR: INR: 2.2 (ref 2.0–3.0)

## 2017-10-23 MED ORDER — AZITHROMYCIN 250 MG PO TABS: ORAL_TABLET | ORAL | 0 refills | Status: DC

## 2017-10-23 NOTE — Patient Instructions (Signed)
Start zithromax in the meantime.  Use Mandy's instructions about the coumadin.  Recheck CXR in about 4 weeks.  Update Dr. Darnell Level next week.  Take care.  Glad to see you.

## 2017-10-23 NOTE — Patient Instructions (Signed)
INR today 2.2  Take 1/2 pill (2.5mg ) daily while on abx therapy (5 days 7/17-7/21) then resume prior dosing instructions of 1 pill (5mg ) daily EXCEPT for 1/2 pill on Mondays.  Recheck in 2 weeks on 11/06/17 at your [facility].  Please call them to make appointment.

## 2017-10-23 NOTE — Progress Notes (Signed)
He has been coughing up sputum, occ reddish coloration.  No fevers.  Some nausea.  Has talked with mult docs about this.    Xray done increased reticulonodular opacities in the right lung may be infectious or inflammatory. Followup PA and lateral chest X-ray is recommended in 3-4 weeks following trial of antibiotic therapy to ensure resolution.  No wheeze.  Dec in appetite in the last few weeks.  He does have noted taste changes. He wears at O2 at night.  Still on coumadin.    Meds, vitals, and allergies reviewed.   ROS: Per HPI unless specifically indicated in ROS section   INR d/w pt at [place].  See other notes.   GEN: nad, alert and oriented HEENT: mucous membranes moist, nasal exam w/o erythema, no discharge noted, OP with minimal cobblestoning NECK: supple w/o LA CV: rrr.   PULM: ctab, no inc wob, no focal dec in BS EXT: no edema SKIN: no acute rash

## 2017-10-25 ENCOUNTER — Ambulatory Visit: Payer: Medicare Other | Admitting: Family Medicine

## 2017-10-27 NOTE — Progress Notes (Signed)
Agree. Thanks

## 2017-10-28 DIAGNOSIS — R9389 Abnormal findings on diagnostic imaging of other specified body structures: Secondary | ICD-10-CM | POA: Insufficient documentation

## 2017-10-28 NOTE — Assessment & Plan Note (Signed)
Presumed atypical/walking PNA.  Nontoxic.  Well appearing.  Speaking in complete sentences.  Okay for outpatient follow-up.  Routine cautions given to patient. Start zithromax in the meantime.  Use [name]'s instructions about coumadin.  Recheck CXR in about 4 weeks.  He'll update Dr. Darnell Level next week.

## 2017-11-04 ENCOUNTER — Ambulatory Visit (INDEPENDENT_AMBULATORY_CARE_PROVIDER_SITE_OTHER)
Admission: RE | Admit: 2017-11-04 | Discharge: 2017-11-04 | Disposition: A | Discharge status: Home / Self Care | Payer: Medicare Other | Source: Ambulatory Visit | Attending: Family Medicine | Admitting: Family Medicine

## 2017-11-04 ENCOUNTER — Ambulatory Visit (INDEPENDENT_AMBULATORY_CARE_PROVIDER_SITE_OTHER): Payer: Medicare Other | Admitting: Family Medicine

## 2017-11-04 ENCOUNTER — Encounter: Payer: Self-pay | Admitting: Cardiology

## 2017-11-04 ENCOUNTER — Telehealth: Payer: Self-pay | Admitting: Family Medicine

## 2017-11-04 ENCOUNTER — Encounter: Payer: Self-pay | Admitting: Family Medicine

## 2017-11-04 VITALS — BP 112/66 | HR 72 | Temp 98.0°F | Ht 66.0 in | Wt 137.5 lb

## 2017-11-04 DIAGNOSIS — R110 Nausea: Secondary | ICD-10-CM

## 2017-11-04 DIAGNOSIS — I272 Pulmonary hypertension, unspecified: Secondary | ICD-10-CM

## 2017-11-04 DIAGNOSIS — R9389 Abnormal findings on diagnostic imaging of other specified body structures: Secondary | ICD-10-CM

## 2017-11-04 DIAGNOSIS — J90 Pleural effusion, not elsewhere classified: Secondary | ICD-10-CM | POA: Diagnosis not present

## 2017-11-04 MED ORDER — PROMETHAZINE HCL 25 MG RE SUPP: 25.0000 mg | Freq: Four times a day (QID) | RECTAL | 3 refills | Status: DC | PRN

## 2017-11-04 NOTE — Telephone Encounter (Signed)
Referral done I will route to [facility] to call the family   I will cc this to Dr Darnell Level regarding ongoing nausea and advice  Continue the phenergan (oral or suppository) if it helps   Thanks

## 2017-11-04 NOTE — Telephone Encounter (Signed)
Please let pt know that I discussed his case after the cxr rev with his PCP Dr Darnell Level He recommended I re refer him to pulmonary (lung doctors) at [facility]   Please ask pt or family if he is agreeable with that  Thanks

## 2017-11-04 NOTE — Telephone Encounter (Signed)
Pt notified of message off of mychart and Dr. Marliss Coots comments. Pt does agree with referral to pulmonary, I advise pt our [facility] will call to schedule appt. Please put referral in  Pt asked me what can he do regarding his sxs he told you about until he sees the specialist, since he isn't feeling any better please advise

## 2017-11-04 NOTE — Patient Instructions (Signed)
Chest xray today - plan to follow   I sent px for phenergan suppositories to your pharmacy   If symptoms suddenly worsen or you are short of breath-get to the hospital

## 2017-11-04 NOTE — Assessment & Plan Note (Signed)
Reviewed cxr from 7/17 with R sided reticulonodular opacities  Will repeat this today-plan to follow  Nausea continues despite tx with zpack for poss atypical pneumonia  Reassuring exam

## 2017-11-04 NOTE — Progress Notes (Signed)
Subjective:    Patient ID: [name], male    DOB: 1931/08/25, 84 y.o.   MRN: [id number]  HPI Here for f/u of pneumonia  Says he is not getting better   84 yo pt of Dr Darnell Level with hx of CHF, pulmonary HTN, hyperglycemia, thrombocytopenia  On coumadin for h/o a flutter    Seen 7/17 by Dr Damita Dunnings Presumed atypical PNA/ was non toxic Px zithromax recommeded re check cxr in 4 wk   Dg Chest 2 View  Result Date: 10/23/2017 CLINICAL DATA:  Hemoptysis.  History of COPD. EXAM: CHEST - 2 VIEW COMPARISON:  Chest x-ray dated Aug 26, 2013. FINDINGS: Unchanged left chest wall pacemaker. Stable cardiomegaly. Normal pulmonary vascularity. Peribronchial thickening. There are few increased reticulonodular opacities in the right lung. Chronic pleural thickening at both costophrenic angles. No focal consolidation, pleural effusion, or pneumothorax. No acute osseous abnormality. IMPRESSION: Increased reticulonodular opacities in the right lung may be infectious or inflammatory. Followup PA and lateral chest X-ray is recommended in 3-4 weeks following trial of antibiotic therapy to ensure resolution. Electronically Signed   By: Titus Dubin M.D.   On: 10/23/2017 08:26    Vitals today BP: 112/66  Pulse Rate: 72  Pulse ox 98% on RA  Feels nauseated- has dry heaves (took phenergan suppositories from his son) A lot of pnd at night -makes this worse  Acute on chronic  Worse bout on sat night  Has seen Dr Tiffany Kocher  Has seen allergist -Dr Donneta Romberg Also ENT   After zpak- nausea improved for a few days and worse again  Not coughing- but clearing up  All the phlegm is in his throat   Not eating well -family is worried    symbicort xeopenex  singulair From allergist-  He told me he has not been diagnosed with asthma  Has been to pulmonary at [facility] in the past according to family   Had pneumonia before years ago   No nasal symptoms  No ear pain  No fever  Had chills Saturday - took temp an  he did not have any  No abdominal pain  No diarrhea / tends to be constipated  Sob- baseline /no worse than usual from CHF - has 02 at home  No wheezing    Patient Active Problem List   Diagnosis Date Noted  . Nausea 11/04/2017  . Abnormal x-ray 10/28/2017  . Pulmonary HTN ([place]) 03/25/2017  . Aortic valve regurgitation 03/25/2017  . Other fatigue 12/04/2016  . Nondisplaced fracture of greater trochanter of left femur ([place]) 10/30/2016  . Closed fracture of left hip ([place]) 10/02/2016  . Status post reverse total arthroplasty of right shoulder 01/17/2016  . Complete tear of right rotator cuff 12/10/2015  . Acute sinusitis 03/18/2015  . Complete tear of left rotator cuff 05/07/2014  . Warfarin anticoagulation 05/07/2014  . Advanced care planning/counseling discussion 03/16/2014  . Constipation 09/09/2013  . Hyperglycemia 09/09/2013  . Medicare annual wellness visit, subsequent 04/22/2012  . 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 Left sided sciatica 06/11/2011  . Thrombocytopenia ([place]) 02/25/2011  . Vitamin B12 deficiency 02/22/2011  . Congestion of respiratory tract 09/25/2010  . Long term current use of anticoagulant 06/27/2010  . ESOPHAGEAL STRICTURE 02/16/2010  . CHEST PAIN 01/03/2010  . Atrial flutter ([place]) 12/20/2008  . Anxiety 12/08/2008  . 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 LBBB 09/13/2008  . Automatic implantable cardioverter-defibrillator in situ 09/13/2008  . PORPHYRIA 12/16/2006  . Chronic systolic heart failure (  [place]) 12/16/2006  . Allergic rhinitis 12/16/2006  . GERD 12/16/2006  . BENIGN PROSTATIC HYPERTROPHY 12/16/2006  . IBS 12/08/1997   Past Medical History:  Diagnosis Date  . AICD (automatic cardioverter/defibrillator) present    s/p gen change 04/2015 w/ MDT [name] Crt-D  DTBA1D1, Serial number [id number]  . Allergic rhinitis    [name]  . Anemia   . Anxiety   . Arthritis   . Atrial flutter ([place])    A.  Status post cardioversion; B.   Tikosyn therapy - failed, remains in aflutter  . Bilateral pneumonia 11/02/2013   treated with levaquin  . BPH (benign prostatic hyperplasia)   . 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 Lucky Cowboy)  . Chronic sinusitis   . Chronic systolic congestive heart failure ([place])   . COPD (chronic obstructive pulmonary disease) ([place])    spirometry 2015 - no obstruction, + mild restrictive lung disease  . Extrinsic asthma    [name]  . Fall with injury 05/28/2017  . GERD (gastroesophageal reflux disease) 2010   h/o esophageal stricture with dilation, LA grade C reflux esophagitis by EGD 2010  . Goiter   . History of diverticulitis of colon   . History of GI bleed    Secondary to hemorrhoids  . IBS (irritable bowel syndrome)   . LBBB (left bundle branch block)   . Multiple pulmonary nodules 06/2013   RUL ([facility]. Adam Phenix at [facility]) - ?vasculitis as of last CT at [facility]  . NICM (nonischemic cardiomyopathy) [facility])    Cardiac catheterization March 2006 without coronary disease; EF 25% 2018 Jan  . [facility])   . Sinus congestion 09/25/2010   Past Surgical History:  Procedure Laterality Date  . BACK SURGERY  1997   bulging disks  . BiV-AICD implant     [employer]  . CARDIAC CATHETERIZATION  06/22/04   Severe, nonischemic cardiomyopathy,EF 25-30%  . CARDIOVERSION  02/22/09   AFlutter-([facility])  . CHOLECYSTECTOMY    . COLONOSCOPY  10/99   Divertic, splenic, hepatic fleure only  . COLONOSCOPY  10/21/08   aborted-divertics, int hemms (Dr. Vira Agar)  . CORONARY ANGIOPLASTY    . EP IMPLANTABLE DEVICE N/A 04/25/2015   Procedure: BIV ICD Generator Changeout;  Surgeon: Evans Lance, MD;  Location: [place] [facility];  Service: Cardiovascular;  Laterality: N/A;  . ESOPHAGEAL DILATION  02/18/98  . ESOPHAGOGASTRODUODENOSCOPY  01/1998   stricture, sliding HH, GERD  . ESOPHAGOGASTRODUODENOSCOPY  04/08/01   stricture, gastritis, HH, GERD-no dilation(Dr. Henrene Pastor)  .  ESOPHAGOGASTRODUODENOSCOPY  12/22/04   stricture; gastritis; duodenitis, GERD  . ESOPHAGOGASTRODUODENOSCOPY  10/21/08   Reflux esophagitis; Erythem. Duod. (Dr. Vira Agar)  . ESOPHAGOGASTRODUODENOSCOPY  08/2013   erythema gastric fundus - minimal gastritis, HH ([place])  . ESOPHAGOGASTRODUODENOSCOPY  08/2016   dysphagia - mod schatzki rin, dilated Tiffany Kocher)  . ESOPHAGOGASTRODUODENOSCOPY (EGD) WITH PROPOFOL N/A 08/24/2016   mod schatzki ring dilated, duodenal deformity Vira Agar, Gavin Pound, MD)  . goiter removal    . HERNIA REPAIR    . HERNIA REPAIR  01/30/02   Dr. Hassell Done  . INSERT / REPLACE / REMOVE PACEMAKER    . NOSE SURGERY  1971  . PENILE PROSTHESIS IMPLANT  2007   [place]  . PFT  10/2013   FVC 61%, FEV1 63%, ratio 0.76  . Pulmonary eval  04/2002   ([facility]) Chronic congestive symptoms  . REVERSE SHOULDER ARTHROPLASTY Right 01/17/2016   Procedure: REVERSE SHOULDER ARTHROPLASTY;  Surgeon:  Corky Mull, MD;  Location: [facility] ORS;  Service: Orthopedics;  Laterality: Right;  . REVERSE TOTAL SHOULDER ARTHROPLASTY Right 01/2016   [place]  . US ECHOCARDIOGRAPHY  07/13/04   EF 25-30%, Mod LVH; LA severe dilation; Mild AR; IRTR  . US ECHOCARDIOGRAPHY  11/27/06   hypokinesis posterior wall, EF 35%; mild AR   Social History   Tobacco Use  . Smoking status: Never Smoker  . Smokeless tobacco: Never Used  Substance Use Topics  . Alcohol use: No  . Drug use: No   Family History  Problem Relation Age of Onset  . Coronary artery disease Father   . Hypertension Father   . Heart failure Mother   . Dysphagia Sister   . Breast cancer Unknown   . Ovarian cancer Unknown   . Uterine cancer Unknown   . Other Sister        stomach problems  . Other Sister        stomach problems  . Other Sister        stomach problems  . Alcohol abuse Maternal Uncle           Carafate [sucralfate]; Clarithromycin; Famotidine; Levaquin [levofloxacin]; Omeprazole; Oxytetracycline; Penicillins; Proton pump inhibitors;  Ranitidine; and Doxycycline    Current Outpatient Medications on File Prior to Visit  Medication Sig Dispense Refill  . acetaminophen (TYLENOL) 500 MG tablet Take 250 mg by mouth daily as needed. Pain    . ALPRAZolam (XANAX) 0.25 MG tablet Take 1 tablet (0.25 mg total) by mouth at bedtime. 60 tablet 0  . alum & mag hydroxide-simeth (MAALOX/MYLANTA) 200-200-20 MG/5ML suspension Take 15 mLs by mouth daily as needed for indigestion or heartburn.    . budesonide-formoterol (SYMBICORT) 160-4.5 MCG/ACT inhaler Inhale 2 puffs into the lungs at bedtime.    . carvedilol (COREG) 6.25 MG tablet Take 1 and 1/2 tablets by mouth twice daily 270 tablet 3  . fluticasone (FLONASE) 50 MCG/ACT nasal spray Place 1 spray into both nostrils daily.    . furosemide (LASIX) 40 MG tablet Take 1 tablet (40 mg total) by mouth daily. 90 tablet 3  . guaiFENesin 200 MG tablet Take 400 mg by mouth every 4 (four) hours as needed for cough or to loosen phlegm.    . levalbuterol (XOPENEX HFA) 45 MCG/ACT inhaler Inhale 1-2 puffs into the lungs every 6 (six) hours as needed for wheezing or shortness of breath. 1 Inhaler 1  . Loratadine 10 MG CAPS Take 1 capsule by mouth daily as needed (allergies).     . montelukast (SINGULAIR) 10 MG tablet Take 10 mg by mouth daily as needed (BREATHING).     . polyethylene glycol (MIRALAX / GLYCOLAX) packet Take 17 g by mouth daily as needed (constipation).     . vitamin B-12 (CYANOCOBALAMIN) 500 MCG tablet Take 500 mg by mouth daily.    Marland Kitchen warfarin (COUMADIN) 5 MG tablet Take 1/2 to 1 tablet by mouth daily as directed by coumadin clinic 135 tablet 0   No current facility-administered medications on file prior to visit.       Review of Systems  Constitutional: Positive for appetite change and chills. Negative for activity change, fatigue, fever and unexpected weight change.       Poor appetite Had chills-now resolved   HENT: Positive for postnasal drip. Negative for congestion, rhinorrhea,  sore throat, trouble swallowing and voice change.   Eyes: Negative for pain, redness, itching and visual disturbance.  Respiratory: Negative for cough, choking, chest  tightness, shortness of breath, wheezing and stridor.   Cardiovascular: Negative for chest pain and palpitations.  Gastrointestinal: Positive for nausea. Negative for abdominal distention, abdominal pain, anal bleeding, blood in stool, constipation, diarrhea, rectal pain and vomiting.  Endocrine: Negative for cold intolerance, heat intolerance, polydipsia and polyuria.  Genitourinary: Negative for difficulty urinating, dysuria, frequency and urgency.  Musculoskeletal: Negative for arthralgias, joint swelling and myalgias.  Skin: Negative for pallor and rash.  Neurological: Negative for dizziness, tremors, weakness, numbness and headaches.  Hematological: Negative for adenopathy. Does not bruise/bleed easily.  Psychiatric/Behavioral: Negative for decreased concentration and dysphoric mood. The patient is not nervous/anxious.        Objective:   Physical Exam  Constitutional: He appears well-developed and well-nourished. No distress.  Well appearing elderly male  HENT:  Head: Normocephalic and atraumatic.  Right Ear: External ear normal.  Left Ear: External ear normal.  Mouth/Throat: Oropharynx is clear and moist. No oropharyngeal exudate.  Nares are boggy  Scant clear pnd  Eyes: Pupils are equal, round, and reactive to light. Conjunctivae and EOM are normal. Right eye exhibits no discharge. Left eye exhibits no discharge. No scleral icterus.  Neck: Normal range of motion. Neck supple. No thyromegaly present.  Cardiovascular: Normal rate and regular rhythm.  Murmur heard. Pulmonary/Chest: Effort normal and breath sounds normal. No stridor. No respiratory distress. He has no wheezes. He has no rales. He exhibits no tenderness.  BS are mildly distant but clear No rales/rhonchi/wheeze (even on forced expiration)      Abdominal: Soft. Bowel sounds are normal. He exhibits no distension and no mass. There is no tenderness. There is no rebound and no guarding.  Musculoskeletal: He exhibits no edema.  Lymphadenopathy:    He has no cervical adenopathy.  Neurological: He is alert. No cranial nerve deficit. Coordination normal.  Skin: Skin is warm and dry. No erythema. No pallor.  Psychiatric: He has a normal mood and affect.  Somewhat irritable- tired of feeling sick Supportive family present           Assessment & Plan:   Problem List Items Addressed This Visit      Other   Abnormal x-ray    Reviewed cxr from 7/17 with R sided reticulonodular opacities  Will repeat this today-plan to follow  Nausea continues despite tx with zpack for poss atypical pneumonia  Reassuring exam       Relevant Orders   DG Chest 2 View (Completed)   Nausea - Primary    Acute on chronic Worse since 7/17 with dry heaves Asks for phenergan supp because he cannot keep down pills (did px these)  Rev hx in detail- has seen GI/ ENT/allergist/ cardiology and pulmonary in the past  No cause found per pt  Says he got better one time with levaquin and steroids but did not know what they were treating  Will re check his CXR today and discuss case with his PCP      Relevant Orders   DG Chest 2 View (Completed)

## 2017-11-04 NOTE — Assessment & Plan Note (Signed)
Acute on chronic Worse since 7/17 with dry heaves Asks for phenergan supp because he cannot keep down pills (did px these)  Rev hx in detail- has seen GI/ ENT/allergist/ cardiology and pulmonary in the past  No cause found per pt  Says he got better one time with levaquin and steroids but did not know what they were treating  Will re check his CXR today and discuss case with his PCP

## 2017-11-05 NOTE — Telephone Encounter (Signed)
Appt made with [facility] per Dr Darnell Level. Appt Dr [name].

## 2017-11-06 ENCOUNTER — Ambulatory Visit (INDEPENDENT_AMBULATORY_CARE_PROVIDER_SITE_OTHER): Payer: Medicare Other

## 2017-11-06 DIAGNOSIS — I4892 Unspecified atrial flutter: Secondary | ICD-10-CM

## 2017-11-06 DIAGNOSIS — Z7901 Long term (current) use of anticoagulants: Secondary | ICD-10-CM | POA: Diagnosis not present

## 2017-11-06 DIAGNOSIS — Z5181 Encounter for therapeutic drug level monitoring: Secondary | ICD-10-CM | POA: Diagnosis not present

## 2017-11-06 LAB — POCT INR: INR: 5.1 — AB (ref 2.0–3.0)

## 2017-11-06 NOTE — Patient Instructions (Signed)
Please skip coumadin today, tomorrow & Friday.  Then resume previous dosage of 1 pill (5mg ) daily EXCEPT for 1/2 pill on Mondays.  Recheck in 2 weeks.

## 2017-11-07 ENCOUNTER — Ambulatory Visit (INDEPENDENT_AMBULATORY_CARE_PROVIDER_SITE_OTHER): Payer: Medicare Other | Admitting: Pulmonary Disease

## 2017-11-07 ENCOUNTER — Encounter: Payer: Self-pay | Admitting: Pulmonary Disease

## 2017-11-07 VITALS — BP 102/70 | HR 61 | Ht 66.0 in | Wt 135.8 lb

## 2017-11-07 DIAGNOSIS — R05 Cough: Secondary | ICD-10-CM | POA: Diagnosis not present

## 2017-11-07 DIAGNOSIS — R059 Cough, unspecified: Secondary | ICD-10-CM

## 2017-11-07 MED ORDER — PREDNISONE 10 MG PO TABS: 10.0000 mg | ORAL_TABLET | Freq: Two times a day (BID) | ORAL | 0 refills | Status: DC

## 2017-11-07 NOTE — Progress Notes (Signed)
ANURAG SCARFO    [id number]    01/02/1932  Primary Care Physician:Gutierrez, Garlon Hatchet, MD  Referring Physician: Ria Bush, MD [address] [place], [place] [address]  Chief complaint:   Patient with a history of cough, chronic clearing of his laryx Always congested  HPI:  Did have nausea, stomach fullness, felt congested in his airways Coughing up some secretions with occasional streaks of blood And a similar episode about 3 -4 years ago-required admission to the hospital Symptoms resolved with use of steroids-no specific diagnosis He denies any nasal stuffiness or congestion, no sinus fullness  Occupation: Does some farming Exposures: No significant exposure to mold or any triggers known to him Smoking history: Never smoker but was exposed to secondhand smoke Travel history: No recent travels Relevant family history: No contributory family history of disease  Outpatient Encounter Medications as of 11/07/2017  Medication Sig  . acetaminophen (TYLENOL) 500 MG tablet Take 250 mg by mouth daily as needed. Pain  . ALPRAZolam (XANAX) 0.25 MG tablet Take 1 tablet (0.25 mg total) by mouth at bedtime.  Marland Kitchen alum & mag hydroxide-simeth (MAALOX/MYLANTA) 200-200-20 MG/5ML suspension Take 15 mLs by mouth daily as needed for indigestion or heartburn.  . budesonide-formoterol (SYMBICORT) 160-4.5 MCG/ACT inhaler Inhale 2 puffs into the lungs at bedtime.  . carvedilol (COREG) 6.25 MG tablet Take 1 and 1/2 tablets by mouth twice daily  . fluticasone (FLONASE) 50 MCG/ACT nasal spray Place 1 spray into both nostrils daily.  . furosemide (LASIX) 40 MG tablet Take 1 tablet (40 mg total) by mouth daily.  Marland Kitchen guaiFENesin 200 MG tablet Take 400 mg by mouth every 4 (four) hours as needed for cough or to loosen phlegm.  . levalbuterol (XOPENEX HFA) 45 MCG/ACT inhaler Inhale 1-2 puffs into the lungs every 6 (six) hours as needed for wheezing or shortness of breath.  . Loratadine  10 MG CAPS Take 1 capsule by mouth daily as needed (allergies).   . montelukast (SINGULAIR) 10 MG tablet Take 10 mg by mouth daily as needed (BREATHING).   . polyethylene glycol (MIRALAX / GLYCOLAX) packet Take 17 g by mouth daily as needed (constipation).   . promethazine (PHENERGAN) 25 MG suppository Place 1 suppository (25 mg total) rectally every 6 (six) hours as needed for nausea or vomiting.  . vitamin B-12 (CYANOCOBALAMIN) 500 MCG tablet Take 500 mg by mouth daily.  Marland Kitchen warfarin (COUMADIN) 5 MG tablet Take 1/2 to 1 tablet by mouth daily as directed by coumadin clinic  . predniSONE (DELTASONE) 10 MG tablet Take 1 tablet (10 mg total) by mouth 2 (two) times daily with a meal. Take 1 tablet twice a day for 5 to 7 days   No facility-administered encounter medications on file as of 11/07/2017.     Allergies as of 11/07/2017 - Review Complete 11/07/2017  Allergen Reaction Noted  . Carafate [sucralfate] Other (See Comments) 07/12/2016  . Clarithromycin Nausea Only 12/16/2006  . Famotidine Other (See Comments) 12/16/2006  . Levaquin [levofloxacin] Other (See Comments) 04/11/2015  . Omeprazole Diarrhea 12/16/2006  . Oxytetracycline Other (See Comments) 12/16/2006  . Penicillins Swelling 12/16/2006  . Proton pump inhibitors Other (See Comments) 10/21/2011  . Ranitidine Diarrhea 12/08/2008  . Doxycycline Rash 08/27/2014    Past Medical History:  Diagnosis Date  . AICD (automatic cardioverter/defibrillator) present    s/p gen change 04/2015 w/ MDT [name] Crt-D  DTBA1D1, Serial number [id number]  . Allergic rhinitis  Donneta Romberg  . Anemia   . Anxiety   . Arthritis   . Atrial flutter ([place])    A.  Status post cardioversion; B.  Tikosyn therapy - failed, remains in aflutter  . Bilateral pneumonia 11/02/2013   treated with levaquin  . BPH (benign prostatic hyperplasia)   . 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 Lucky Cowboy)  . Chronic sinusitis   . Chronic systolic  congestive heart failure ([place])   . COPD (chronic obstructive pulmonary disease) ([place])    spirometry 2015 - no obstruction, + mild restrictive lung disease  . Extrinsic asthma    [name]  . Fall with injury 05/28/2017  . GERD (gastroesophageal reflux disease) 2010   h/o esophageal stricture with dilation, LA grade C reflux esophagitis by EGD 2010  . Goiter   . History of diverticulitis of colon   . History of GI bleed    Secondary to hemorrhoids  . IBS (irritable bowel syndrome)   . LBBB (left bundle branch block)   . Multiple pulmonary nodules 06/2013   RUL ([facility] Adam Phenix [facility]) - ?vasculitis as of last CT at [facility]  . NICM (nonischemic cardiomyopathy) [facility])    Cardiac catheterization March 2006 without coronary disease; EF 25% 2018 Jan  . [facility])   . Sinus congestion 09/25/2010    Past Surgical History:  Procedure Laterality Date  . BACK SURGERY  1997   bulging disks  . BiV-AICD implant     [employer]  . CARDIAC CATHETERIZATION  06/22/04   Severe, nonischemic cardiomyopathy,EF 25-30%  . CARDIOVERSION  02/22/09   AFlutter-([facility])  . CHOLECYSTECTOMY    . COLONOSCOPY  10/99   Divertic, splenic, hepatic fleure only  . COLONOSCOPY  10/21/08   aborted-divertics, int hemms (Dr. Vira Agar)  . CORONARY ANGIOPLASTY    . EP IMPLANTABLE DEVICE N/A 04/25/2015   Procedure: BIV ICD Generator Changeout;  Surgeon: Evans Lance, MD;  Location: [facility];  Service: Cardiovascular;  Laterality: N/A;  . ESOPHAGEAL DILATION  02/18/98  . ESOPHAGOGASTRODUODENOSCOPY  01/1998   stricture, sliding HH, GERD  . ESOPHAGOGASTRODUODENOSCOPY  04/08/01   stricture, gastritis, HH, GERD-no dilation(Dr. Henrene Pastor)  . ESOPHAGOGASTRODUODENOSCOPY  12/22/04   stricture; gastritis; duodenitis, GERD  . ESOPHAGOGASTRODUODENOSCOPY  10/21/08   Reflux esophagitis; Erythem. Duod. (Dr. Vira Agar)  . ESOPHAGOGASTRODUODENOSCOPY  08/2013   erythema gastric fundus - minimal gastritis, HH ([place])  .  ESOPHAGOGASTRODUODENOSCOPY  08/2016   dysphagia - mod schatzki rin, dilated Tiffany Kocher)  . ESOPHAGOGASTRODUODENOSCOPY (EGD) WITH PROPOFOL N/A 08/24/2016   mod schatzki ring dilated, duodenal deformity Vira Agar, Gavin Pound, MD)  . goiter removal    . HERNIA REPAIR    . HERNIA REPAIR  01/30/02   Dr. Hassell Done  . INSERT / REPLACE / REMOVE PACEMAKER    . NOSE SURGERY  1971  . PENILE PROSTHESIS IMPLANT  2007   [place]  . PFT  10/2013   FVC 61%, FEV1 63%, ratio 0.76  . Pulmonary eval  04/2002   ([facility]) Chronic congestive symptoms  . REVERSE SHOULDER ARTHROPLASTY Right 01/17/2016   Procedure: REVERSE SHOULDER ARTHROPLASTY;  Surgeon: Corky Mull, MD;  Location: [facility] ORS;  Service: Orthopedics;  Laterality: Right;  . REVERSE TOTAL SHOULDER ARTHROPLASTY Right 01/2016   Poggi  . US ECHOCARDIOGRAPHY  07/13/04   EF 25-30%, Mod LVH; LA severe dilation; Mild AR; IRTR  . US ECHOCARDIOGRAPHY  11/27/06   hypokinesis posterior wall, EF 35%; mild AR  Family History  Problem Relation Age of Onset  . Coronary artery disease Father   . Hypertension Father   . Heart failure Mother   . Dysphagia Sister   . Breast cancer Unknown   . Ovarian cancer Unknown   . Uterine cancer Unknown   . Other Sister        stomach problems  . Other Sister        stomach problems  . Other Sister        stomach problems  . Alcohol abuse Maternal Uncle     Social History   Socioeconomic History  . Marital status: Married    Spouse name: Not on file  . Number of children: 3  . Years of education: Not on file  . Highest education level: Not on file  Occupational History  . Occupation: retired    Fish farm manager: RETIRED  Social Needs  . Financial resource strain: Not on file  . Food insecurity:    Worry: Not on file    Inability: Not on file  . Transportation needs:    Medical: Not on file    Non-medical: Not on file  Tobacco Use  . Smoking status: Never Smoker  . Smokeless tobacco: Never Used  Substance and Sexual  Activity  . Alcohol use: No  . Drug use: No  . Sexual activity: Never  Lifestyle  . Physical activity:    Days per week: Not on file    Minutes per session: Not on file  . Stress: Not on file  Relationships  . Social connections:    Talks on phone: Not on file    Gets together: Not on file    Attends religious service: Not on file    Active member of club or organization: Not on file    Attends meetings of clubs or organizations: Not on file    Relationship status: Not on file  . Intimate partner violence:    Fear of current or ex partner: Not on file    Emotionally abused: Not on file    Physically abused: Not on file    Forced sexual activity: Not on file  Other Topics Concern  . Not on file  Social History Narrative   Lives with wife   3 children-8 grandchildren   Still works on a farm.   Retired from [place] friend with Dr. Jefm Bryant   Activity: No regular exercise   Diet: good water, fruits/vegetables daily    Review of Systems  Constitutional: Negative for fatigue.  HENT: Negative.  Negative for postnasal drip and rhinorrhea.   Eyes: Negative.   Respiratory: Positive for cough and shortness of breath.   Cardiovascular: Positive for leg swelling. Negative for chest pain.  Endocrine: Negative.   Genitourinary: Negative.   Musculoskeletal: Negative.   Skin: Negative.   Allergic/Immunologic: Negative.   Neurological: Negative.   Hematological: Negative.   Psychiatric/Behavioral: Negative.     Vitals:   11/07/17 1109  BP: 102/70  Pulse: 61  SpO2: 95%     Physical Exam  Constitutional: He appears well-developed and well-nourished.  HENT:  Head: Normocephalic.  Eyes: Pupils are equal, round, and reactive to light. Conjunctivae are normal. Right eye exhibits no discharge. Left eye exhibits no discharge.  Neck: Normal range of motion. No thyromegaly present.  Cardiovascular: Normal rate and regular rhythm.    Pulmonary/Chest: Effort normal and breath sounds normal. No respiratory distress. He has no wheezes. He  has no rales.  Abdominal: Soft. Bowel sounds are normal. He exhibits no distension.  Musculoskeletal: Normal range of motion. He exhibits no edema or deformity.  Neurological: He is alert. He has normal reflexes. No cranial nerve deficit. Coordination normal.  Skin: Skin is warm and dry. No erythema.  Psychiatric: He has a normal mood and affect.   Data Reviewed: Chest x-ray reviewed by myself Elevated INR-been addressed  Assessment:   Chronic cough History of obstructive lung disease History of cardiomyopathy Chronic systolic heart failure History of pulmonary hypertension He does have an AICD in place History of multiple nodules on CT in 2017  Plan/Recommendations:  Therapeutic trial with steroids Obtain a CT scan of the chest  Will see him back in the office in about 2 to 3 weeks Prednisone 10 p.o. twice daily-to be stopped within 5 to 7 days if he is feeling better  He did have an abnormal CT scan of the chest in the past--felt to be related to an infectious process which cleared, does not recollect a repeat CT been performed Feeling symptoms currently may be related to what happened a few years back Vasculitic work-up was negative in the past Did have a history of sinusitis although denied any significant symptoms of nasal stuffiness or congestion at present  We will see him back in the office in 2 to 3 weeks   Sherrilyn Rist MD [facility] 11/07/2017, 11:42 AM  CC: Ria Bush, MD

## 2017-11-07 NOTE — Patient Instructions (Signed)
Chronic cough Chronic clearing of pharynx No nasal stuffiness or congestion  History of abnormal CT scan with concern of a mass and discussions regarding a biopsy   Obtain CT scan of the chest  Empiric trial with steroids-this did help your symptoms in the past Will prescribe prednisone 10 p.o. twice daily-use for about 5 to 7 days, may cut down to 1 daily at between 5 and 7 days Do not use for longer than 7 days if feeling better Stop taking steroids after 5 to 7 days if feeling better  I will see you back in the office to go over CT scan findings  Will see you back in the office in 2 to 3 weeks

## 2017-11-11 ENCOUNTER — Telehealth: Payer: Self-pay | Admitting: Cardiovascular Disease

## 2017-11-11 NOTE — Telephone Encounter (Signed)
Spoke w/ pt.   He reports that he saw pulmonology and stared on Prednisone taper 11/08/17. Pt sched to come in for INR check on Wed. 11/13/17.

## 2017-11-11 NOTE — Telephone Encounter (Signed)
Pt states he is currently taking Prednisone. Please call to discuss.

## 2017-11-12 ENCOUNTER — Encounter: Payer: Self-pay | Admitting: Pulmonary Disease

## 2017-11-13 ENCOUNTER — Ambulatory Visit (INDEPENDENT_AMBULATORY_CARE_PROVIDER_SITE_OTHER): Payer: Medicare Other

## 2017-11-13 DIAGNOSIS — Z7901 Long term (current) use of anticoagulants: Secondary | ICD-10-CM

## 2017-11-13 DIAGNOSIS — I4892 Unspecified atrial flutter: Secondary | ICD-10-CM | POA: Diagnosis not present

## 2017-11-13 LAB — POCT INR: INR: 1.5 — AB (ref 2.0–3.0)

## 2017-11-13 NOTE — Telephone Encounter (Signed)
Dr [name]- the following is an email from pt's son---FYI---  Dr. [name],  This is [name] Gilchrest son of Kristine Royal Pfister you saw last Thursday August 1st.    I just wanted to give you an fyi on this. he took one pill Thursday night and the very next morning when I called him he said he was amazed what one pill could do. he felt better and was hungry again. he has been taking 1 in morning and 1 at night since then (plan to cut back to 1 tomorrow like your suggested) but it so far has been a miracle cure so I hope this will help explain something on what is going on with him. I just spoke to him and he is amazed at how he feels. has more energy, more appetite, says drainage taste in his mouth is mostly gone.    he has the lung scan this week and we will see you next week with results but just wanted you to know that so far the steroid is a miracle drug for whatever is going on with him. and that is same drug that helped him so much back in 2015 when in hospital with nausea issues.    Thank you sir    Markies Mowatt  [phone]

## 2017-11-13 NOTE — Patient Instructions (Signed)
Please take extra 1/2 dose today, then contine previous dosage of 1 pill (5mg ) daily EXCEPT for 1/2 pill on Mondays.  Recheck in 2 weeks.

## 2017-11-14 ENCOUNTER — Ambulatory Visit
Admission: RE | Admit: 2017-11-14 | Discharge: 2017-11-14 | Disposition: A | Discharge status: Home / Self Care | Payer: Medicare Other | Source: Ambulatory Visit | Attending: Pulmonary Disease | Admitting: Pulmonary Disease

## 2017-11-14 DIAGNOSIS — I700 Atherosclerosis of aorta: Secondary | ICD-10-CM | POA: Insufficient documentation

## 2017-11-14 DIAGNOSIS — R05 Cough: Secondary | ICD-10-CM

## 2017-11-14 DIAGNOSIS — J90 Pleural effusion, not elsewhere classified: Secondary | ICD-10-CM | POA: Diagnosis not present

## 2017-11-14 DIAGNOSIS — R918 Other nonspecific abnormal finding of lung field: Secondary | ICD-10-CM | POA: Diagnosis not present

## 2017-11-14 DIAGNOSIS — R059 Cough, unspecified: Secondary | ICD-10-CM

## 2017-11-14 DIAGNOSIS — I313 Pericardial effusion (noninflammatory): Secondary | ICD-10-CM | POA: Diagnosis not present

## 2017-11-20 ENCOUNTER — Ambulatory Visit (INDEPENDENT_AMBULATORY_CARE_PROVIDER_SITE_OTHER): Payer: Medicare Other | Admitting: Pulmonary Disease

## 2017-11-20 ENCOUNTER — Encounter: Payer: Self-pay | Admitting: Pulmonary Disease

## 2017-11-20 VITALS — BP 100/70 | HR 63 | Ht 66.0 in | Wt 136.4 lb

## 2017-11-20 DIAGNOSIS — J988 Other specified respiratory disorders: Secondary | ICD-10-CM | POA: Diagnosis not present

## 2017-11-20 DIAGNOSIS — I272 Pulmonary hypertension, unspecified: Secondary | ICD-10-CM | POA: Diagnosis not present

## 2017-11-20 MED ORDER — PREDNISONE 5 MG PO TABS: 5.0000 mg | ORAL_TABLET | Freq: Every day | ORAL | 1 refills | Status: DC

## 2017-11-20 NOTE — Progress Notes (Signed)
[name]    [id number]    [birth year]  Primary Care Physician:Gutierrez, Garlon Hatchet, MD  Referring Physician: Ria Bush, MD [address] [place], [place] [address]  Chief complaint:   Patient with a history of cough, chronic clearing of his laryx Always congested Symptoms have improved significantly since her last visit on steroids   HPI:  Continues to feel better at present Less congestion, more activity Better appetite No significant pain or discomfort  Feels great compared to when he was last in the office   Outpatient Encounter Medications as of 11/20/2017  Medication Sig  . acetaminophen (TYLENOL) 500 MG tablet Take 250 mg by mouth daily as needed. Pain  . ALPRAZolam (XANAX) 0.25 MG tablet Take 1 tablet (0.25 mg total) by mouth at bedtime.  Marland Kitchen alum & mag hydroxide-simeth (MAALOX/MYLANTA) 200-200-20 MG/5ML suspension Take 15 mLs by mouth daily as needed for indigestion or heartburn.  . budesonide-formoterol (SYMBICORT) 160-4.5 MCG/ACT inhaler Inhale 2 puffs into the lungs at bedtime.  . carvedilol (COREG) 6.25 MG tablet Take 1 and 1/2 tablets by mouth twice daily  . fluticasone (FLONASE) 50 MCG/ACT nasal spray Place 1 spray into both nostrils daily.  . furosemide (LASIX) 40 MG tablet Take 1 tablet (40 mg total) by mouth daily.  Marland Kitchen guaiFENesin 200 MG tablet Take 400 mg by mouth every 4 (four) hours as needed for cough or to loosen phlegm.  . levalbuterol (XOPENEX HFA) 45 MCG/ACT inhaler Inhale 1-2 puffs into the lungs every 6 (six) hours as needed for wheezing or shortness of breath.  . Loratadine 10 MG CAPS Take 1 capsule by mouth daily as needed (allergies).   . montelukast (SINGULAIR) 10 MG tablet Take 10 mg by mouth daily as needed (BREATHING).   . polyethylene glycol (MIRALAX / GLYCOLAX) packet Take 17 g by mouth daily as needed (constipation).   . predniSONE (DELTASONE) 10 MG tablet Take 1 tablet (10 mg total) by mouth 2 (two) times  daily with a meal. Take 1 tablet twice a day for 5 to 7 days  . promethazine (PHENERGAN) 25 MG suppository Place 1 suppository (25 mg total) rectally every 6 (six) hours as needed for nausea or vomiting.  . vitamin B-12 (CYANOCOBALAMIN) 500 MCG tablet Take 500 mg by mouth daily.  Marland Kitchen warfarin (COUMADIN) 5 MG tablet Take 1/2 to 1 tablet by mouth daily as directed by coumadin clinic   No facility-administered encounter medications on file as of 11/20/2017.     Allergies as of 11/20/2017 - Review Complete 11/20/2017  Allergen Reaction Noted  . Carafate [sucralfate] Other (See Comments) 07/12/2016  . Clarithromycin Nausea Only 12/16/2006  . Famotidine Other (See Comments) 12/16/2006  . Levaquin [levofloxacin] Other (See Comments) 04/11/2015  . Omeprazole Diarrhea 12/16/2006  . Oxytetracycline Other (See Comments) 12/16/2006  . Penicillins Swelling 12/16/2006  . Proton pump inhibitors Other (See Comments) 10/21/2011  . Ranitidine Diarrhea 12/08/2008  . Doxycycline Rash 08/27/2014    Past Medical History:  Diagnosis Date  . AICD (automatic cardioverter/defibrillator) present    s/p gen change 04/2015 w/ MDT [name] Crt-D  DTBA1D1, Serial number [id number]  . Allergic rhinitis    [name]  . Anemia   . Anxiety   . Arthritis   . Atrial flutter ([place])    A.  Status post cardioversion; B.  Tikosyn therapy - failed, remains in aflutter  . Bilateral pneumonia 11/02/2013   treated with levaquin  . BPH (benign prostatic  hyperplasia)   . 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 Lucky Cowboy)  . Chronic sinusitis   . Chronic systolic congestive heart failure ([place])   . COPD (chronic obstructive pulmonary disease) ([place])    spirometry 2015 - no obstruction, + mild restrictive lung disease  . Extrinsic asthma    [name]  . Fall with injury 05/28/2017  . GERD (gastroesophageal reflux disease) 2010   h/o esophageal stricture with dilation, LA grade C reflux esophagitis by EGD 2010  .  Goiter   . History of diverticulitis of colon   . History of GI bleed    Secondary to hemorrhoids  . IBS (irritable bowel syndrome)   . LBBB (left bundle branch block)   . Multiple pulmonary nodules 06/2013   RUL ([facility] Adam Phenix [facility]) - ?vasculitis as of last CT at [facility]  . NICM (nonischemic cardiomyopathy) [facility])    Cardiac catheterization March 2006 without coronary disease; EF 25% 2018 Jan  . [facility])   . Sinus congestion 09/25/2010    Past Surgical History:  Procedure Laterality Date  . BACK SURGERY  1997   bulging disks  . BiV-AICD implant     Medtronic  . CARDIAC CATHETERIZATION  06/22/04   Severe, nonischemic cardiomyopathy,EF 25-30%  . CARDIOVERSION  02/22/09   AFlutter-([facility])  . CHOLECYSTECTOMY    . COLONOSCOPY  10/99   Divertic, splenic, hepatic fleure only  . COLONOSCOPY  10/21/08   aborted-divertics, int hemms (Dr. Vira Agar)  . CORONARY ANGIOPLASTY    . EP IMPLANTABLE DEVICE N/A 04/25/2015   Procedure: BIV ICD Generator Changeout;  Surgeon: Evans Lance, MD;  Location: [place] CV [facility];  Service: Cardiovascular;  Laterality: N/A;  . ESOPHAGEAL DILATION  02/18/98  . ESOPHAGOGASTRODUODENOSCOPY  01/1998   stricture, sliding HH, GERD  . ESOPHAGOGASTRODUODENOSCOPY  04/08/01   stricture, gastritis, HH, GERD-no dilation(Dr. Henrene Pastor)  . ESOPHAGOGASTRODUODENOSCOPY  12/22/04   stricture; gastritis; duodenitis, GERD  . ESOPHAGOGASTRODUODENOSCOPY  10/21/08   Reflux esophagitis; Erythem. Duod. (Dr. Vira Agar)  . ESOPHAGOGASTRODUODENOSCOPY  08/2013   erythema gastric fundus - minimal gastritis, HH (Oh)  . ESOPHAGOGASTRODUODENOSCOPY  08/2016   dysphagia - mod schatzki rin, dilated Tiffany Kocher)  . ESOPHAGOGASTRODUODENOSCOPY (EGD) WITH PROPOFOL N/A 08/24/2016   mod schatzki ring dilated, duodenal deformity Vira Agar, Gavin Pound, MD)  . goiter removal    . HERNIA REPAIR    . HERNIA REPAIR  01/30/02   Dr. Hassell Done  . INSERT / REPLACE / REMOVE PACEMAKER    . NOSE SURGERY   1971  . PENILE PROSTHESIS IMPLANT  2007   Otelin  . PFT  10/2013   FVC 61%, FEV1 63%, ratio 0.76  . Pulmonary eval  04/2002   ([facility]) Chronic congestive symptoms  . REVERSE SHOULDER ARTHROPLASTY Right 01/17/2016   Procedure: REVERSE SHOULDER ARTHROPLASTY;  Surgeon: Corky Mull, MD;  Location: [facility] ORS;  Service: Orthopedics;  Laterality: Right;  . REVERSE TOTAL SHOULDER ARTHROPLASTY Right 01/2016   Poggi  . US ECHOCARDIOGRAPHY  07/13/04   EF 25-30%, Mod LVH; LA severe dilation; Mild AR; IRTR  . US ECHOCARDIOGRAPHY  11/27/06   hypokinesis posterior wall, EF 35%; mild AR    Family History  Problem Relation Age of Onset  . Coronary artery disease Father   . Hypertension Father   . Heart failure Mother   . Dysphagia Sister   . Breast cancer Unknown   . Ovarian cancer Unknown   . Uterine cancer  Unknown   . Other Sister        stomach problems  . Other Sister        stomach problems  . Other Sister        stomach problems  . Alcohol abuse Maternal Uncle     Social History   Socioeconomic History  . Marital status: Married    Spouse name: Not on file  . Number of children: 3  . Years of education: Not on file  . Highest education level: Not on file  Occupational History  . Occupation: retired    Fish farm manager: RETIRED  Social Needs  . Financial resource strain: Not on file  . Food insecurity:    Worry: Not on file    Inability: Not on file  . Transportation needs:    Medical: Not on file    Non-medical: Not on file  Tobacco Use  . Smoking status: Never Smoker  . Smokeless tobacco: Never Used  Substance and Sexual Activity  . Alcohol use: No  . Drug use: No  . Sexual activity: Never  Lifestyle  . Physical activity:    Days per week: Not on file    Minutes per session: Not on file  . Stress: Not on file  Relationships  . Social connections:    Talks on phone: Not on file    Gets together: Not on file    Attends religious service: Not on file    Active member of  club or organization: Not on file    Attends meetings of clubs or organizations: Not on file    Relationship status: Not on file  . Intimate partner violence:    Fear of current or ex partner: Not on file    Emotionally abused: Not on file    Physically abused: Not on file    Forced sexual activity: Not on file  Other Topics Concern  . Not on file  Social History Narrative   Lives with wife   3 children-8 grandchildren   Still works on a farm.   Retired from [place] friend with Dr. Jefm Bryant   Activity: No regular exercise   Diet: good water, fruits/vegetables daily    Review of Systems  HENT: Negative for postnasal drip and rhinorrhea.   Respiratory: Positive for cough.        Cough is better, congestion is better  Cardiovascular: Positive for leg swelling. Negative for chest pain.  Endocrine: Negative.   Genitourinary: Negative.   Skin: Negative.   Allergic/Immunologic: Negative.   Hematological: Negative.   All other systems reviewed and are negative.   There were no vitals filed for this visit.   Physical Exam  Constitutional: He is oriented to person, place, and time. He appears well-developed and well-nourished.  HENT:  Head: Normocephalic.  Eyes: Pupils are equal, round, and reactive to light. Conjunctivae and EOM are normal. Right eye exhibits no discharge. Left eye exhibits no discharge.  Neck: Normal range of motion. No tracheal deviation present. No thyromegaly present.  Cardiovascular: Normal rate and regular rhythm.  Pulmonary/Chest: Effort normal and breath sounds normal. No respiratory distress. He has no wheezes. He has no rales.  Abdominal: Soft. Bowel sounds are normal. He exhibits no distension.  Musculoskeletal: Normal range of motion. He exhibits no edema or deformity.  Neurological: He is alert and oriented to person, place, and time. He has normal reflexes. No cranial nerve deficit. Coordination normal.  Skin:  Skin is warm and dry. No erythema.  Psychiatric: He has a normal mood and affect. His behavior is normal.   Data Reviewed: Chest x-ray reviewed by myself  CT scan of the chest reviewed showing pleural effusions and pulmonary congestion  Assessment:   Chronic cough--improving  History of obstructive lung disease--symptoms have been stable  History of cardiomyopathy--stable symptoms  Chronic systolic heart failure--stable  History of pulmonary hypertension He does have an AICD in place History of multiple nodules on CT in 2017, repeat CT showing pulmonary congestion, small effusions  Plan/Recommendations:  Therapeutic trial with steroids-seems to have helped, goal is to wean him to the lowest dose possible and possible discontinuation  Will see him back in the office in about 3 months Prednisone 5 mg every other day and attempt to stop in about 2 weeks  He continues to improve, encouraged to call if any significant changes in symptoms  I will see him back in 3 months   Sherrilyn Rist MD [facility] 11/20/2017, 11:38 AM  CC: Ria Bush, MD

## 2017-11-20 NOTE — Patient Instructions (Signed)
Shortness of breath, congestion  Congestive heart failure  Symptoms have improved significantly   We will continue to try to wean off steroids  Cut down to 5 mg every other day at present  Attempt to discontinue steroids in 2 weeks  Our goal is still lowest dose of steroids to keep you functioning  I will see you back in the office in 3 months

## 2017-11-27 ENCOUNTER — Ambulatory Visit (INDEPENDENT_AMBULATORY_CARE_PROVIDER_SITE_OTHER): Payer: Medicare Other

## 2017-11-27 DIAGNOSIS — Z7901 Long term (current) use of anticoagulants: Secondary | ICD-10-CM | POA: Diagnosis not present

## 2017-11-27 DIAGNOSIS — I4892 Unspecified atrial flutter: Secondary | ICD-10-CM | POA: Diagnosis not present

## 2017-11-27 LAB — POCT INR: INR: 4.8 — AB (ref 2.0–3.0)

## 2017-11-27 NOTE — Patient Instructions (Signed)
Please skip coumadin today and tomorrow, then contine previous dosage of 1 pill (5mg ) daily EXCEPT for 1/2 pill on Mondays.  Recheck in 2 weeks.

## 2017-12-11 ENCOUNTER — Ambulatory Visit (INDEPENDENT_AMBULATORY_CARE_PROVIDER_SITE_OTHER): Payer: Medicare Other

## 2017-12-11 DIAGNOSIS — I4892 Unspecified atrial flutter: Secondary | ICD-10-CM | POA: Diagnosis not present

## 2017-12-11 DIAGNOSIS — Z7901 Long term (current) use of anticoagulants: Secondary | ICD-10-CM | POA: Diagnosis not present

## 2017-12-11 DIAGNOSIS — Z5181 Encounter for therapeutic drug level monitoring: Secondary | ICD-10-CM | POA: Diagnosis not present

## 2017-12-11 LAB — POCT INR: INR: 2.3 (ref 2.0–3.0)

## 2017-12-11 NOTE — Patient Instructions (Signed)
Please contine previous dosage of 1 pill (5mg ) daily EXCEPT for 1/2 pill on Mondays.  Recheck in 3 weeks.

## 2017-12-18 NOTE — Progress Notes (Signed)
HPI [name] presents for follow up of his cardiomyopathy and atrial flutter.  EF on the last echo that I ordered in Jan was 20 - 25%.   Since I last saw him he had cough with sputum production with pink sputum.  He has been treated with steroids.  Thankfully he has done much better.  He feels less breathless.  He is not having any weakness or fatigue.  He denies any chest pressure, neck or arm discomfort.  He is getting out of his garden.  Allergies  Allergen Reactions  . Clarithromycin Nausea Only  . Famotidine Other (See Comments)    ABD. PAIN  . Levaquin [Levofloxacin] Other (See Comments)    Stomach pain, has to eat a lot of food  . Omeprazole Diarrhea  . Oxytetracycline Other (See Comments)    BUMPS  . Penicillins Swelling      . Proton Pump Inhibitors Other (See Comments)    GI upset, diarrhea, gas, bloating  . Ranitidine Diarrhea  . Doxycycline Rash    Current Outpatient Medications  Medication Sig Dispense Refill  . acetaminophen (TYLENOL) 500 MG tablet Take 250 mg by mouth daily as needed. Pain    . ALPRAZolam (XANAX) 0.25 MG tablet Take 1 tablet (0.25 mg total) by mouth at bedtime. 60 tablet 0  . alum & mag hydroxide-simeth (MAALOX/MYLANTA) 200-200-20 MG/5ML suspension Take 15 mLs by mouth daily as needed for indigestion or heartburn.    . budesonide-formoterol (SYMBICORT) 160-4.5 MCG/ACT inhaler Inhale 2 puffs into the lungs at bedtime.    . carvedilol (COREG) 6.25 MG tablet Take 1 and 1/2 tablets by mouth twice daily 270 tablet 3  . fluticasone (FLONASE) 50 MCG/ACT nasal spray Place 1 spray into both nostrils daily.    . furosemide (LASIX) 40 MG tablet Take 1 tablet (40 mg total) by mouth daily. 90 tablet 3  . guaiFENesin 200 MG tablet Take 400 mg by mouth every 4 (four) hours as needed for cough or to loosen phlegm.    . levalbuterol (XOPENEX HFA) 45 MCG/ACT inhaler Inhale 1-2 puffs into the lungs every 6 (six) hours as needed for wheezing or  shortness of breath. 1 Inhaler 1  . Loratadine 10 MG CAPS Take 1 capsule by mouth daily as needed (allergies).     . montelukast (SINGULAIR) 10 MG tablet Take 10 mg by mouth daily as needed (BREATHING).     . polyethylene glycol (MIRALAX / GLYCOLAX) packet Take 17 g by mouth daily as needed (constipation).     . predniSONE (DELTASONE) 5 MG tablet Take 1 tablet (5 mg total) by mouth daily with breakfast. 30 tablet 1  . promethazine (PHENERGAN) 25 MG suppository Place 1 suppository (25 mg total) rectally every 6 (six) hours as needed for nausea or vomiting. 12 each 3  . vitamin B-12 (CYANOCOBALAMIN) 500 MCG tablet Take 500 mg by mouth daily.    Marland Kitchen warfarin (COUMADIN) 5 MG tablet Take 1/2 to 1 tablet by mouth daily as directed by coumadin clinic 135 tablet 0   No current facility-administered medications for this visit.     Past Medical History:  Diagnosis Date  . AICD (automatic cardioverter/defibrillator) present    s/p gen change 04/2015 w/ MDT [name] Crt-D  DTBA1D1, Serial number [id number]  . Allergic rhinitis    [name]  . Anemia   . Anxiety   . Arthritis   . Atrial flutter ([place])    A.  Status post cardioversion;  B.  Tikosyn therapy - failed, remains in aflutter  . Bilateral pneumonia 11/02/2013   treated with levaquin  . BPH (benign prostatic hyperplasia)   . 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 Lucky Cowboy)  . Chronic sinusitis   . Chronic systolic congestive heart failure ([place])   . COPD (chronic obstructive pulmonary disease) ([place])    spirometry 2015 - no obstruction, + mild restrictive lung disease  . Extrinsic asthma    [name]  . Fall with injury 05/28/2017  . GERD (gastroesophageal reflux disease) 2010   h/o esophageal stricture with dilation, LA grade C reflux esophagitis by EGD 2010  . Goiter   . History of diverticulitis of colon   . History of GI bleed    Secondary to hemorrhoids  . IBS (irritable bowel syndrome)   . LBBB (left bundle branch  block)   . Multiple pulmonary nodules 06/2013   RUL ([facility] Adam Phenix [facility]) - ?vasculitis as of last CT at [facility]  . NICM (nonischemic cardiomyopathy) [facility])    Cardiac catheterization March 2006 without coronary disease; EF 25% 2018 Jan  . [facility])   . Sinus congestion 09/25/2010    Past Surgical History:  Procedure Laterality Date  . BACK SURGERY  1997   bulging disks  . BiV-AICD implant     Medtronic  . CARDIAC CATHETERIZATION  06/22/04   Severe, nonischemic cardiomyopathy,EF 25-30%  . CARDIOVERSION  02/22/09   AFlutter-([facility])  . CHOLECYSTECTOMY    . COLONOSCOPY  10/99   Divertic, splenic, hepatic fleure only  . COLONOSCOPY  10/21/08   aborted-divertics, int hemms (Dr. Vira Agar)  . CORONARY ANGIOPLASTY    . EP IMPLANTABLE DEVICE N/A 04/25/2015   Procedure: BIV ICD Generator Changeout;  Surgeon: Evans Lance, MD;  Location: [place] CV [facility];  Service: Cardiovascular;  Laterality: N/A;  . ESOPHAGEAL DILATION  02/18/98  . ESOPHAGOGASTRODUODENOSCOPY  01/1998   stricture, sliding HH, GERD  . ESOPHAGOGASTRODUODENOSCOPY  04/08/01   stricture, gastritis, HH, GERD-no dilation(Dr. Henrene Pastor)  . ESOPHAGOGASTRODUODENOSCOPY  12/22/04   stricture; gastritis; duodenitis, GERD  . ESOPHAGOGASTRODUODENOSCOPY  10/21/08   Reflux esophagitis; Erythem. Duod. (Dr. Vira Agar)  . ESOPHAGOGASTRODUODENOSCOPY  08/2013   erythema gastric fundus - minimal gastritis, HH ([place])  . ESOPHAGOGASTRODUODENOSCOPY  08/2016   dysphagia - mod schatzki rin, dilated Tiffany Kocher)  . ESOPHAGOGASTRODUODENOSCOPY (EGD) WITH PROPOFOL N/A 08/24/2016   mod schatzki ring dilated, duodenal deformity Vira Agar, Gavin Pound, MD)  . goiter removal    . HERNIA REPAIR    . HERNIA REPAIR  01/30/02   Dr. Hassell Done  . INSERT / REPLACE / REMOVE PACEMAKER    . NOSE SURGERY  1971  . PENILE PROSTHESIS IMPLANT  2007   Otelin  . PFT  10/2013   FVC 61%, FEV1 63%, ratio 0.76  . Pulmonary eval  04/2002   ([facility]) Chronic congestive symptoms    . REVERSE SHOULDER ARTHROPLASTY Right 01/17/2016   Procedure: REVERSE SHOULDER ARTHROPLASTY;  Surgeon: Corky Mull, MD;  Location: [facility] ORS;  Service: Orthopedics;  Laterality: Right;  . REVERSE TOTAL SHOULDER ARTHROPLASTY Right 01/2016   Poggi  . US ECHOCARDIOGRAPHY  07/13/04   EF 25-30%, Mod LVH; LA severe dilation; Mild AR; IRTR  . US ECHOCARDIOGRAPHY  11/27/06   hypokinesis posterior wall, EF 35%; mild AR    ROS:  As stated in the HPI and negative for all other systems.   PHYSICAL EXAM BP 118/68 (BP Location: Left  Arm, Patient Position: Sitting, Cuff Size: Normal)   Pulse 79   Ht 5\' 6"  (1.676 m)   Wt 139 lb (63 kg)   BMI 22.44 kg/m   GENERAL:  Well appearing NECK:  No jugular venous distention, waveform within normal limits, carotid upstroke brisk and symmetric, no bruits, no thyromegaly LUNGS:  Clear to auscultation bilaterally CHEST: Well-healed ICD pocket HEART:  PMI not displaced or sustained,S1 and S2 within normal limits, no S3, no S4, no clicks, no rubs, 2 out of 6 apical systolic murmur radiating at the aortic outflow tract, 2 out of 6 diastolic murmur heard at the third left interspace murmurs ABD:  Flat, positive bowel sounds normal in frequency in pitch, no bruits, no rebound, no guarding, no midline pulsatile mass, no hepatomegaly, no splenomegaly EXT:  2 plus pulses throughout, trace ankle edema, no cyanosis no clubbing    EKG:  NA  ASSESSMENT AND PLAN   Nonischemic cardiomyopathy -  They want conservative therapy.  He has not tolerate med titration.  However, he seems to be relatively asymptomatic at this moment in therapy.   Atrial flutter -   [name] has a CHA2DS2 - VASc score of 3.  He will continue with meds as listed.  Pulmonary HTN - He is going to continue with a steroid taper.  Is not clear to me the process that is being treated but there must of been some inflammation is improved and his pulmonologist agrees to continue the taper  but restart if his symptoms are

## 2017-12-19 ENCOUNTER — Encounter: Payer: Self-pay | Admitting: Cardiology

## 2017-12-19 ENCOUNTER — Ambulatory Visit (INDEPENDENT_AMBULATORY_CARE_PROVIDER_SITE_OTHER): Payer: Medicare Other | Admitting: Cardiology

## 2017-12-19 VITALS — BP 118/68 | HR 79 | Ht 66.0 in | Wt 139.0 lb

## 2017-12-19 DIAGNOSIS — I5022 Chronic systolic (congestive) heart failure: Secondary | ICD-10-CM

## 2017-12-19 NOTE — Patient Instructions (Signed)
Medication Instructions:  Your physician recommends that you continue on your current medications as directed. Please refer to the Current Medication list given to you today.   Labwork: Bmet today  Testing/Procedures: None ordered  Follow-Up: Your physician recommends that you schedule a follow-up appointment in: 3 months with Dr.Hochrein   Any Other Special Instructions Will Be Listed Below (If Applicable).     If you need a refill on your cardiac medications before your next appointment, please call your pharmacy.

## 2017-12-20 LAB — BASIC METABOLIC PANEL
BUN/Creatinine Ratio: 15 (ref 10–24)
BUN: 20 mg/dL (ref 8–27)
CO2: 25 mmol/L (ref 20–29)
Calcium: 9.1 mg/dL (ref 8.6–10.2)
Chloride: 98 mmol/L (ref 96–106)
Creatinine, Ser: 1.36 mg/dL — ABNORMAL HIGH (ref 0.76–1.27)
GFR calc Af Amer: 55 mL/min/{1.73_m2} — ABNORMAL LOW (ref >59)
GFR calc non Af Amer: 47 mL/min/{1.73_m2} — ABNORMAL LOW (ref >59)
Glucose: 89 mg/dL (ref 65–99)
Potassium: 4.8 mmol/L (ref 3.5–5.2)
Sodium: 141 mmol/L (ref 134–144)

## 2017-12-23 ENCOUNTER — Telehealth: Payer: Self-pay | Admitting: Cardiology

## 2017-12-23 ENCOUNTER — Ambulatory Visit: Payer: Medicare Other | Admitting: *Deleted

## 2017-12-23 NOTE — Telephone Encounter (Signed)
Spoke with pt and reminded pt of remote transmission that is due today. Pt verbalized understanding.   

## 2017-12-24 ENCOUNTER — Encounter: Payer: Self-pay | Admitting: Cardiology

## 2017-12-24 NOTE — Telephone Encounter (Signed)
° ° °  Patient states the monitor has a low battery indicator. Please call  1. Has your device fired? NO  2. Is you device beeping? NO  3. Are you experiencing draining or swelling at device site? NO  4. Are you calling to see if we received your device transmission? YES  5. Have you passed out? NO    Please route to [place]

## 2017-12-24 NOTE — Telephone Encounter (Signed)
Called patient about remote transmission. Patient states that the he keeps getting an error code when he attempts to send a transmission. The error code is for the wand not being charged. I asked patient to try repositioning the wand in the cradle. Wife states that they've tried, but the wand still won't charge. Monitor troubleshooting request form submitted in [employer]. Patient aware.

## 2017-12-25 ENCOUNTER — Ambulatory Visit (INDEPENDENT_AMBULATORY_CARE_PROVIDER_SITE_OTHER): Payer: Medicare Other | Admitting: *Deleted

## 2017-12-25 DIAGNOSIS — I5022 Chronic systolic (congestive) heart failure: Secondary | ICD-10-CM | POA: Diagnosis not present

## 2017-12-25 DIAGNOSIS — I428 Other cardiomyopathies: Secondary | ICD-10-CM

## 2017-12-25 NOTE — Telephone Encounter (Signed)
Transmission received.

## 2017-12-26 NOTE — Progress Notes (Signed)
Remote ICD transmission.   

## 2018-01-01 ENCOUNTER — Ambulatory Visit (INDEPENDENT_AMBULATORY_CARE_PROVIDER_SITE_OTHER): Payer: Medicare Other

## 2018-01-01 ENCOUNTER — Other Ambulatory Visit: Payer: Self-pay | Admitting: *Deleted

## 2018-01-01 DIAGNOSIS — Z7901 Long term (current) use of anticoagulants: Secondary | ICD-10-CM | POA: Diagnosis not present

## 2018-01-01 DIAGNOSIS — I4892 Unspecified atrial flutter: Secondary | ICD-10-CM | POA: Diagnosis not present

## 2018-01-01 LAB — POCT INR: INR: 3 (ref 2.0–3.0)

## 2018-01-01 MED ORDER — FUROSEMIDE 40 MG PO TABS: 40.0000 mg | ORAL_TABLET | Freq: Every day | ORAL | 3 refills | Status: DC

## 2018-01-01 NOTE — Patient Instructions (Signed)
Please have a serving of greens today and continue previous dosage of 1 pill (5mg ) daily EXCEPT for 1/2 pill on Mondays.  Recheck in 4 weeks

## 2018-01-08 NOTE — Telephone Encounter (Signed)
Dr. [name], please see email from pt's son Heidi Lemay as an [place] on how pt has been doing recently with having to go on and off of prednisone.  Please advise if there is anything we need to do for pt in regards to this. Thanks!

## 2018-01-09 ENCOUNTER — Encounter: Payer: Self-pay | Admitting: Family Medicine

## 2018-01-09 ENCOUNTER — Ambulatory Visit (INDEPENDENT_AMBULATORY_CARE_PROVIDER_SITE_OTHER): Payer: Medicare Other | Admitting: Family Medicine

## 2018-01-09 VITALS — BP 114/70 | HR 72 | Temp 98.0°F | Ht 66.0 in | Wt 139.0 lb

## 2018-01-09 DIAGNOSIS — I272 Pulmonary hypertension, unspecified: Secondary | ICD-10-CM | POA: Diagnosis not present

## 2018-01-09 DIAGNOSIS — I5022 Chronic systolic (congestive) heart failure: Secondary | ICD-10-CM

## 2018-01-09 DIAGNOSIS — I429 Cardiomyopathy, unspecified: Secondary | ICD-10-CM

## 2018-01-09 DIAGNOSIS — J988 Other specified respiratory disorders: Secondary | ICD-10-CM

## 2018-01-09 DIAGNOSIS — J984 Other disorders of lung: Secondary | ICD-10-CM | POA: Diagnosis not present

## 2018-01-09 DIAGNOSIS — F419 Anxiety disorder, unspecified: Secondary | ICD-10-CM | POA: Diagnosis not present

## 2018-01-09 MED ORDER — ALPRAZOLAM 0.25 MG PO TABS: 0.2500 mg | ORAL_TABLET | Freq: Every day | ORAL | 0 refills | Status: DC

## 2018-01-09 NOTE — Patient Instructions (Addendum)
Continue prednisone 5mg  Touch base with lung doctor Good to see you today.

## 2018-01-09 NOTE — Progress Notes (Signed)
BP 114/70 (BP Location: Right Arm, Patient Position: Sitting, Cuff Size: Normal)   Pulse 72   Temp 98 F (36.7 C) (Oral)   Ht 5\' 6"  (1.676 m)   Wt 139 lb (63 kg)   SpO2 99%   BMI 22.44 kg/m    CC: 6 mo f/u visit Subjective:    Patient ID: [name], male    DOB: January 18, 1932, 84 y.o.   MRN: [id number]  HPI: [name] is a 84 y.o. male presenting on 01/09/2018 for 6 mo follow up (Pt accompanied by his wife. )   Recent notes reviewed. Has been seeing pulmonology Dr [name], recent notes reviewed. Chronic cough with COPD and pulm HTN - recent therapeutic steroid trial with benefit, on prednisone 5mg  QOD to QD, goal is to find lowest dose possible to control symptoms.   Symptoms returning - morning nausea recurring, increased productive cough of colored mucous, some increased cough. No wheezing or dyspnea.   Not using oxygen while on prednisone.  Relevant past medical, surgical, family and social history reviewed and updated as indicated. Interim medical history since our last visit reviewed. Allergies and medications reviewed and updated. Outpatient Medications Prior to Visit  Medication Sig Dispense Refill  . acetaminophen (TYLENOL) 500 MG tablet Take 250 mg by mouth daily as needed. Pain    . alum & mag hydroxide-simeth (MAALOX/MYLANTA) 200-200-20 MG/5ML suspension Take 15 mLs by mouth daily as needed for indigestion or heartburn.    . budesonide-formoterol (SYMBICORT) 160-4.5 MCG/ACT inhaler Inhale 2 puffs into the lungs at bedtime.    . carvedilol (COREG) 6.25 MG tablet Take 1 and 1/2 tablets by mouth twice daily 270 tablet 3  . fluticasone (FLONASE) 50 MCG/ACT nasal spray Place 1 spray into both nostrils daily.    . furosemide (LASIX) 40 MG tablet Take 1 tablet (40 mg total) by mouth daily. 90 tablet 3  . guaiFENesin 200 MG tablet Take 400 mg by mouth every 4 (four) hours as needed for cough or to loosen phlegm.    . levalbuterol (XOPENEX HFA) 45 MCG/ACT  inhaler Inhale 1-2 puffs into the lungs every 6 (six) hours as needed for wheezing or shortness of breath. 1 Inhaler 1  . Loratadine 10 MG CAPS Take 1 capsule by mouth daily as needed (allergies).     . montelukast (SINGULAIR) 10 MG tablet Take 10 mg by mouth daily as needed (BREATHING).     . polyethylene glycol (MIRALAX / GLYCOLAX) packet Take 17 g by mouth daily as needed (constipation).     . predniSONE (DELTASONE) 5 MG tablet Take 1 tablet (5 mg total) by mouth daily with breakfast. 30 tablet 1  . promethazine (PHENERGAN) 25 MG suppository Place 1 suppository (25 mg total) rectally every 6 (six) hours as needed for nausea or vomiting. 12 each 3  . vitamin B-12 (CYANOCOBALAMIN) 500 MCG tablet Take 500 mg by mouth daily.    Marland Kitchen warfarin (COUMADIN) 5 MG tablet Take 1/2 to 1 tablet by mouth daily as directed by coumadin clinic 135 tablet 0  . ALPRAZolam (XANAX) 0.25 MG tablet Take 1 tablet (0.25 mg total) by mouth at bedtime. 60 tablet 0   No facility-administered medications prior to visit.      Per HPI unless specifically indicated in ROS section below Review of Systems     Objective:    BP 114/70 (BP Location: Right Arm, Patient Position: Sitting, Cuff Size: Normal)   Pulse 72   Temp 98 F (36.7  C) (Oral)   Ht 5\' 6"  (1.676 m)   Wt 139 lb (63 kg)   SpO2 99%   BMI 22.44 kg/m   Wt Readings from Last 3 Encounters:  01/09/18 139 lb (63 kg)  12/19/17 139 lb (63 kg)  11/20/17 136 lb 6.4 oz (61.9 kg)    Physical Exam  Constitutional: He appears well-developed and well-nourished. No distress.  HENT:  Mouth/Throat: Oropharynx is clear and moist. No oropharyngeal exudate.  Cardiovascular: Normal rate and regular rhythm.  Murmur (systolic) heard. Pulmonary/Chest: Effort normal and breath sounds normal. No respiratory distress. He has no wheezes. He has no rales.  Lungs clear  Musculoskeletal: He exhibits no edema.  Nursing note and vitals reviewed.  Results for orders placed or  performed in visit on 01/01/18  POCT INR  Result Value Ref Range   INR 3.0 2.0 - 3.0      Assessment & Plan:   Problem List Items Addressed This Visit    Secondary cardiomyopathy ([place])   Pulmonary HTN ([place])   Congestion of respiratory tract - Primary    Ongoing, chronic, initially improved with prednisone trial, symptoms recurring as he's tried taper off prednisone. Back on prednisone 5mg  daily. Unclear exact etiology of symptoms but likely sCHF and chronic lung disease contributing. Encouraged pulm f/u. Goal per pulm is to control symptoms with minimal prednisone dose.      Chronic systolic heart failure (H[facility])   Chronic lung disease   Anxiety    Chronic, refilled xanax - sparing use.       Relevant Medications   ALPRAZolam (XANAX) 0.25 MG tablet       Meds ordered this encounter  Medications  . ALPRAZolam (XANAX) 0.25 MG tablet    Sig: Take 1 tablet (0.25 mg total) by mouth at bedtime.    Dispense:  60 tablet    Refill:  0    This request is for a new prescription for a controlled substance as required by Federal/State law..   No orders of the defined types were placed in this encounter.   Follow up plan: Return if symptoms worsen or fail to improve.  Ria Bush, MD

## 2018-01-10 ENCOUNTER — Encounter: Payer: Self-pay | Admitting: Family Medicine

## 2018-01-10 DIAGNOSIS — J984 Other disorders of lung: Secondary | ICD-10-CM | POA: Insufficient documentation

## 2018-01-10 NOTE — Assessment & Plan Note (Signed)
Chronic, refilled xanax - sparing use.

## 2018-01-10 NOTE — Assessment & Plan Note (Addendum)
Ongoing, chronic, initially improved with prednisone trial, symptoms recurring as he's tried taper off prednisone. Back on prednisone 5mg  daily. Unclear exact etiology of symptoms but likely sCHF and chronic lung disease contributing. Encouraged pulm f/u. Goal per pulm is to control symptoms with minimal prednisone dose.

## 2018-01-13 ENCOUNTER — Other Ambulatory Visit: Payer: Self-pay | Admitting: *Deleted

## 2018-01-17 LAB — CUP PACEART REMOTE DEVICE CHECK
Battery Remaining Longevity: 37 mo
Battery Voltage: 2.93 V
Brady Statistic AP VP Percent: 0 %
Brady Statistic AP VS Percent: 0 %
Brady Statistic AS VP Percent: 96.34 %
Brady Statistic AS VS Percent: 3.66 %
Brady Statistic RA Percent Paced: 0 %
Brady Statistic RV Percent Paced: 97.38 %
Date Time Interrogation Session: 20190918105306
HighPow Impedance: 40 Ohm
HighPow Impedance: 50 Ohm
Implantable Lead Implant Date: 20070525
Implantable Lead Implant Date: 20070525
Implantable Lead Implant Date: 20070525
Implantable Lead Location: 753858
Implantable Lead Location: 753859
Implantable Lead Location: 753860
Implantable Lead Model: 4194
Implantable Lead Model: 5076
Implantable Lead Model: 6949
Implantable Pulse Generator Implant Date: 20170116
Lead Channel Impedance Value: 190 Ohm
Lead Channel Impedance Value: 285 Ohm
Lead Channel Impedance Value: 399 Ohm
Lead Channel Impedance Value: 418 Ohm
Lead Channel Impedance Value: 475 Ohm
Lead Channel Impedance Value: 475 Ohm
Lead Channel Pacing Threshold Amplitude: 0.75 V
Lead Channel Pacing Threshold Amplitude: 0.875 V
Lead Channel Pacing Threshold Pulse Width: 0.4 ms
Lead Channel Pacing Threshold Pulse Width: 0.4 ms
Lead Channel Sensing Intrinsic Amplitude: 14.25 mV
Lead Channel Sensing Intrinsic Amplitude: 14.25 mV
Lead Channel Sensing Intrinsic Amplitude: 7.125 mV
Lead Channel Sensing Intrinsic Amplitude: 7.125 mV
Lead Channel Setting Pacing Amplitude: 2.5 V
Lead Channel Setting Pacing Amplitude: 2.5 V
Lead Channel Setting Pacing Pulse Width: 0.4 ms
Lead Channel Setting Pacing Pulse Width: 0.4 ms
Lead Channel Setting Sensing Sensitivity: 0.45 mV

## 2018-01-23 DIAGNOSIS — K219 Gastro-esophageal reflux disease without esophagitis: Secondary | ICD-10-CM | POA: Diagnosis not present

## 2018-01-23 DIAGNOSIS — J300 Vasomotor rhinitis: Secondary | ICD-10-CM | POA: Diagnosis not present

## 2018-01-23 DIAGNOSIS — H1045 Other chronic allergic conjunctivitis: Secondary | ICD-10-CM | POA: Diagnosis not present

## 2018-01-23 DIAGNOSIS — J4530 Mild persistent asthma, uncomplicated: Secondary | ICD-10-CM | POA: Diagnosis not present

## 2018-01-29 ENCOUNTER — Ambulatory Visit (INDEPENDENT_AMBULATORY_CARE_PROVIDER_SITE_OTHER): Payer: Medicare Other

## 2018-01-29 DIAGNOSIS — Z7901 Long term (current) use of anticoagulants: Secondary | ICD-10-CM

## 2018-01-29 DIAGNOSIS — R9389 Abnormal findings on diagnostic imaging of other specified body structures: Secondary | ICD-10-CM

## 2018-01-29 DIAGNOSIS — I4892 Unspecified atrial flutter: Secondary | ICD-10-CM | POA: Diagnosis not present

## 2018-01-29 DIAGNOSIS — Z5181 Encounter for therapeutic drug level monitoring: Secondary | ICD-10-CM

## 2018-01-29 DIAGNOSIS — J019 Acute sinusitis, unspecified: Secondary | ICD-10-CM | POA: Diagnosis not present

## 2018-01-29 LAB — POCT INR: INR: 3.1 — AB (ref 2.0–3.0)

## 2018-01-29 NOTE — Patient Instructions (Signed)
Please take 1/2 tablet tonight, then START NEW DOSAGE of 1 pill (5mg ) daily EXCEPT for 1/2 pill on Mondays.  Recheck in 4 weeks.

## 2018-02-11 DIAGNOSIS — L820 Inflamed seborrheic keratosis: Secondary | ICD-10-CM | POA: Diagnosis not present

## 2018-02-11 DIAGNOSIS — D2262 Melanocytic nevi of left upper limb, including shoulder: Secondary | ICD-10-CM | POA: Diagnosis not present

## 2018-02-11 DIAGNOSIS — D225 Melanocytic nevi of trunk: Secondary | ICD-10-CM | POA: Diagnosis not present

## 2018-02-11 DIAGNOSIS — L910 Hypertrophic scar: Secondary | ICD-10-CM | POA: Diagnosis not present

## 2018-02-11 DIAGNOSIS — D2261 Melanocytic nevi of right upper limb, including shoulder: Secondary | ICD-10-CM | POA: Diagnosis not present

## 2018-02-11 DIAGNOSIS — Z85828 Personal history of other malignant neoplasm of skin: Secondary | ICD-10-CM | POA: Diagnosis not present

## 2018-02-11 DIAGNOSIS — D485 Neoplasm of uncertain behavior of skin: Secondary | ICD-10-CM | POA: Diagnosis not present

## 2018-02-11 DIAGNOSIS — L298 Other pruritus: Secondary | ICD-10-CM | POA: Diagnosis not present

## 2018-02-11 DIAGNOSIS — Z08 Encounter for follow-up examination after completed treatment for malignant neoplasm: Secondary | ICD-10-CM | POA: Diagnosis not present

## 2018-02-17 ENCOUNTER — Other Ambulatory Visit: Payer: Self-pay | Admitting: Pulmonary Disease

## 2018-02-17 MED ORDER — PREDNISONE 5 MG PO TABS: 5.0000 mg | ORAL_TABLET | Freq: Every day | ORAL | 1 refills | Status: DC

## 2018-02-17 NOTE — Telephone Encounter (Signed)
Patient son is calling and wanted to see if patient can get a refill for Prednisone 5 mg. [facility] [place] [phone]

## 2018-02-26 ENCOUNTER — Ambulatory Visit (INDEPENDENT_AMBULATORY_CARE_PROVIDER_SITE_OTHER): Payer: Medicare Other

## 2018-02-26 DIAGNOSIS — I4892 Unspecified atrial flutter: Secondary | ICD-10-CM

## 2018-02-26 DIAGNOSIS — K5909 Other constipation: Secondary | ICD-10-CM | POA: Diagnosis not present

## 2018-02-26 DIAGNOSIS — Z7901 Long term (current) use of anticoagulants: Secondary | ICD-10-CM

## 2018-02-26 LAB — POCT INR: INR: 2 (ref 2.0–3.0)

## 2018-02-26 NOTE — Patient Instructions (Signed)
Please continue of 1 pill (5mg ) daily EXCEPT for 1/2 pill on Mondays & Fridays. Recheck in 4 weeks.

## 2018-03-16 NOTE — Progress Notes (Signed)
HPI [name] presents for follow up of his cardiomyopathy and atrial flutter.  EF on the last echo that I ordered in Jan was 20 - 25%.   Since I last saw him he has done very well.  He is right now shelling pecans.  The patient denies any new symptoms such as chest discomfort, neck or arm discomfort. There has been no new shortness of breath, PND or orthopnea. There have been no reported palpitations, presyncope or syncope.  He is actually in a turn his oxygen because he is not been using it.  He occasionally has a little ankle edema or maybe some mild shortness of breath.   Allergies  Allergen Reactions  . Clarithromycin Nausea Only  . Famotidine Other (See Comments)    ABD. PAIN  . Levaquin [Levofloxacin] Other (See Comments)    Stomach pain, has to eat a lot of food  . Omeprazole Diarrhea  . Oxytetracycline Other (See Comments)    BUMPS  . Penicillins Swelling      . Proton Pump Inhibitors Other (See Comments)    GI upset, diarrhea, gas, bloating  . Ranitidine Diarrhea  . Doxycycline Rash    Current Outpatient Medications  Medication Sig Dispense Refill  . acetaminophen (TYLENOL) 500 MG tablet Take 250 mg by mouth daily as needed. Pain    . ALPRAZolam (XANAX) 0.25 MG tablet Take 1 tablet (0.25 mg total) by mouth at bedtime. 60 tablet 0  . alum & mag hydroxide-simeth (MAALOX/MYLANTA) 200-200-20 MG/5ML suspension Take 15 mLs by mouth daily as needed for indigestion or heartburn.    . budesonide-formoterol (SYMBICORT) 160-4.5 MCG/ACT inhaler Inhale 2 puffs into the lungs at bedtime.    . carvedilol (COREG) 6.25 MG tablet Take 1 and 1/2 tablets by mouth twice daily 270 tablet 3  . fluticasone (FLONASE) 50 MCG/ACT nasal spray Place 1 spray into both nostrils daily.    . furosemide (LASIX) 40 MG tablet Take 1 tablet (40 mg total) by mouth daily. 90 tablet 3  . guaiFENesin 200 MG tablet Take 400 mg by mouth every 4 (four) hours as needed for cough or to loosen  phlegm.    . levalbuterol (XOPENEX HFA) 45 MCG/ACT inhaler Inhale 1-2 puffs into the lungs every 6 (six) hours as needed for wheezing or shortness of breath. 1 Inhaler 1  . Loratadine 10 MG CAPS Take 1 capsule by mouth daily as needed (allergies).     . montelukast (SINGULAIR) 10 MG tablet Take 10 mg by mouth daily as needed (BREATHING).     . polyethylene glycol (MIRALAX / GLYCOLAX) packet Take 17 g by mouth daily as needed (constipation).     . predniSONE (DELTASONE) 5 MG tablet Take 1 tablet (5 mg total) by mouth daily with breakfast. 30 tablet 1  . promethazine (PHENERGAN) 25 MG suppository Place 1 suppository (25 mg total) rectally every 6 (six) hours as needed for nausea or vomiting. 12 each 3  . vitamin B-12 (CYANOCOBALAMIN) 500 MCG tablet Take 500 mg by mouth daily.    Marland Kitchen warfarin (COUMADIN) 5 MG tablet Take 1/2 to 1 tablet by mouth daily as directed by coumadin clinic 135 tablet 0   No current facility-administered medications for this visit.     Past Medical History:  Diagnosis Date  . AICD (automatic cardioverter/defibrillator) present    s/p gen change 04/2015 w/ MDT [name] Crt-D  DTBA1D1, Serial number [id number]  . Allergic rhinitis    [name]  .  Anemia   . Anxiety   . Arthritis   . Atrial flutter ([place])    A.  Status post cardioversion; B.  Tikosyn therapy - failed, remains in aflutter  . Bilateral pneumonia 11/02/2013   treated with levaquin  . BPH (benign prostatic hyperplasia)   . 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 Lucky Cowboy)  . Chronic lung disease    spirometry 2015 - no obstruction, + mild restrictive lung disease  . Chronic sinusitis   . Chronic systolic congestive heart failure ([place])   . Extrinsic asthma    [name]  . Fall with injury 05/28/2017  . GERD (gastroesophageal reflux disease) 2010   h/o esophageal stricture with dilation, LA grade C reflux esophagitis by EGD 2010  . Goiter   . History of diverticulitis of colon   . History of  GI bleed    Secondary to hemorrhoids  . IBS (irritable bowel syndrome)   . LBBB (left bundle branch block)   . Multiple pulmonary nodules 06/2013   RUL ([facility] Adam Phenix [facility]) - ?vasculitis as of last CT at [facility]  . NICM (nonischemic cardiomyopathy) [facility])    Cardiac catheterization March 2006 without coronary disease; EF 25% 2018 Jan  . [facility])   . Sinus congestion 09/25/2010    Past Surgical History:  Procedure Laterality Date  . BACK SURGERY  1997   bulging disks  . BiV-AICD implant     [employer]  . CARDIAC CATHETERIZATION  06/22/04   Severe, nonischemic cardiomyopathy,EF 25-30%  . CARDIOVERSION  02/22/09   AFlutter-([facility])  . CHOLECYSTECTOMY    . COLONOSCOPY  10/99   Divertic, splenic, hepatic fleure only  . COLONOSCOPY  10/21/08   aborted-divertics, int hemms (Dr. Vira Agar)  . CORONARY ANGIOPLASTY    . EP IMPLANTABLE DEVICE N/A 04/25/2015   Procedure: BIV ICD Generator Changeout;  Surgeon: Evans Lance, MD;  Location: [facility];  Service: Cardiovascular;  Laterality: N/A;  . ESOPHAGEAL DILATION  02/18/98  . ESOPHAGOGASTRODUODENOSCOPY  01/1998   stricture, sliding HH, GERD  . ESOPHAGOGASTRODUODENOSCOPY  04/08/01   stricture, gastritis, HH, GERD-no dilation(Dr. Henrene Pastor)  . ESOPHAGOGASTRODUODENOSCOPY  12/22/04   stricture; gastritis; duodenitis, GERD  . ESOPHAGOGASTRODUODENOSCOPY  10/21/08   Reflux esophagitis; Erythem. Duod. (Dr. Vira Agar)  . ESOPHAGOGASTRODUODENOSCOPY  08/2013   erythema gastric fundus - minimal gastritis, HH ([place])  . ESOPHAGOGASTRODUODENOSCOPY  08/2016   dysphagia - mod schatzki rin, dilated Tiffany Kocher)  . ESOPHAGOGASTRODUODENOSCOPY (EGD) WITH PROPOFOL N/A 08/24/2016   mod schatzki ring dilated, duodenal deformity Vira Agar, Gavin Pound, MD)  . goiter removal    . HERNIA REPAIR    . HERNIA REPAIR  01/30/02   Dr. Hassell Done  . INSERT / REPLACE / REMOVE PACEMAKER    . NOSE SURGERY  1971  . PENILE PROSTHESIS IMPLANT  2007   [place]  . PFT   10/2013   FVC 61%, FEV1 63%, ratio 0.76  . Pulmonary eval  04/2002   ([facility]) Chronic congestive symptoms  . REVERSE SHOULDER ARTHROPLASTY Right 01/17/2016   Procedure: REVERSE SHOULDER ARTHROPLASTY;  Surgeon: Corky Mull, MD;  Location: [facility] ORS;  Service: Orthopedics;  Laterality: Right;  . REVERSE TOTAL SHOULDER ARTHROPLASTY Right 01/2016   Poggi  . US ECHOCARDIOGRAPHY  07/13/04   EF 25-30%, Mod LVH; LA severe dilation; Mild AR; IRTR  . US ECHOCARDIOGRAPHY  11/27/06   hypokinesis posterior wall, EF 35%; mild AR    ROS:  As  stated in the HPI and negative for all other systems.   PHYSICAL EXAM BP 112/66   Pulse 81   Ht 5\' 6"  (1.676 m)   Wt 142 lb 12.8 oz (64.8 kg)   SpO2 95%   BMI 23.05 kg/m   GENERAL:  Well appearing NECK:  Positive jugular venous distention, waveform within normal limits, carotid upstroke brisk and symmetric, no bruits, no thyromegaly LUNGS:  Clear to auscultation bilaterally CHEST:  Well healed ICD pocket.    HEART:  PMI not displaced or sustained,S1 and S2 within normal limits, no S3, no S4, no clicks, no rubs, 2 out of 6 apical diastolic murmur heard best in the left third fourth intercostal space ABD:  Flat, positive bowel sounds normal in frequency in pitch, no bruits, no rebound, no guarding, no midline pulsatile mass, no hepatomegaly, no splenomegaly EXT:  2 plus pulses throughout, no edema, no cyanosis no clubbing   EKG:   NA   ASSESSMENT AND PLAN   Nonischemic cardiomyopathy -  He is doing much better.  We talked about PRN dosing of his diuretics.  No change in therapy is indicated at this point.  Atrial flutter -   [name] has a CHA2DS2 - VASc score of 3.  He will continue with anticoagulation.  Pulmonary HTN - He has tried to decrease his steroids but he does not do well when he comes off of these completely so he is continuing 5 mg.  Otherwise no change in therapy.

## 2018-03-20 ENCOUNTER — Ambulatory Visit (INDEPENDENT_AMBULATORY_CARE_PROVIDER_SITE_OTHER): Payer: Medicare Other | Admitting: Cardiology

## 2018-03-20 ENCOUNTER — Encounter: Payer: Self-pay | Admitting: Cardiology

## 2018-03-20 VITALS — BP 112/66 | HR 81 | Ht 66.0 in | Wt 142.8 lb

## 2018-03-20 DIAGNOSIS — I5023 Acute on chronic systolic (congestive) heart failure: Secondary | ICD-10-CM | POA: Diagnosis not present

## 2018-03-20 NOTE — Patient Instructions (Signed)
Medication Instructions:  Continue current medications  If you need a refill on your cardiac medications before your next appointment, please call your pharmacy.  Labwork: None ordered  If you have labs (blood work) drawn today and your tests are completely normal, you will receive your results only by [employer] (if you have MyChart) -OR- A paper copy in the mail.  If you have any lab test that is abnormal or we need to change your treatment, we will call you to review these results.  Testing/Procedures: None Ordered  Follow-Up: You will need a follow up appointment in 6 months.  Please call our office 2 months in advance to schedule this appointment.  You may see Dr [name] or one of the following Advanced Practice Providers on your designated Care Team:   Rosaria Ferries, PA-C . Jory Sims, DNP, ANP    At [facility], you and your health needs are our priority.  As part of our continuing mission to provide you with exceptional heart care, we have created designated Provider Care Teams.  These Care Teams include your primary Cardiologist (physician) and Advanced Practice Providers (APPs -  Physician Assistants and Nurse Practitioners) who all work together to provide you with the care you need, when you need it.

## 2018-03-25 ENCOUNTER — Other Ambulatory Visit: Payer: Self-pay | Admitting: Family Medicine

## 2018-03-25 NOTE — Telephone Encounter (Signed)
Name of Medication: Alprazolam [facility] Last Fill or Written Date and Quantity: 02/26/18, #30/1 Last Office Visit and Type: 01/09/18, acute Next Office Visit and Type: 07/11/18, CPE Last Controlled Substance Agreement Date: none Last UDS: none

## 2018-03-26 ENCOUNTER — Ambulatory Visit (INDEPENDENT_AMBULATORY_CARE_PROVIDER_SITE_OTHER): Payer: Medicare Other

## 2018-03-26 DIAGNOSIS — Z7901 Long term (current) use of anticoagulants: Secondary | ICD-10-CM | POA: Diagnosis not present

## 2018-03-26 DIAGNOSIS — I428 Other cardiomyopathies: Secondary | ICD-10-CM

## 2018-03-26 DIAGNOSIS — I5022 Chronic systolic (congestive) heart failure: Secondary | ICD-10-CM

## 2018-03-26 DIAGNOSIS — I4892 Unspecified atrial flutter: Secondary | ICD-10-CM | POA: Diagnosis not present

## 2018-03-26 LAB — POCT INR: INR: 3.3 — AB (ref 2.0–3.0)

## 2018-03-26 NOTE — Patient Instructions (Addendum)
Please take 1/2 tablet tonight, then continue of 1 pill (5mg ) daily EXCEPT for 1/2 pill on Mondays & Fridays. Recheck in 3 weeks.

## 2018-03-26 NOTE — Progress Notes (Signed)
Remote ICD transmission.   

## 2018-03-26 NOTE — Telephone Encounter (Signed)
Eprescribed.

## 2018-03-27 ENCOUNTER — Encounter: Payer: Self-pay | Admitting: Cardiology

## 2018-04-16 ENCOUNTER — Ambulatory Visit: Payer: Medicare HMO

## 2018-04-16 DIAGNOSIS — I4892 Unspecified atrial flutter: Secondary | ICD-10-CM

## 2018-04-16 DIAGNOSIS — Z7901 Long term (current) use of anticoagulants: Secondary | ICD-10-CM | POA: Diagnosis not present

## 2018-04-16 LAB — POCT INR: INR: 2.1 (ref 2.0–3.0)

## 2018-04-16 NOTE — Patient Instructions (Signed)
Please continue of 1 pill (5mg ) daily EXCEPT for 1/2 pill on Mondays, Wednesdays & Fridays. Recheck in 4 weeks.

## 2018-04-17 ENCOUNTER — Telehealth: Payer: Self-pay | Admitting: Pulmonary Disease

## 2018-04-17 NOTE — Telephone Encounter (Signed)
This MyChart encounter was inadvertently sent to the incorrect pool and therefor was not able to be seen by Pulmonary. Encounter from 02/13/18, medication was sent to pharmacy. Will sign off.

## 2018-04-17 NOTE — Telephone Encounter (Signed)
Dr. [name], can we refill prednisone for pt? I wasn't sure if you wanted to d/c. Spoke with pt's son and he states the prednisone helps with the mucus and makes him feel better. At this point, he feels like he needs it to breathe.    Assessment:   Chronic cough--improving  History of obstructive lung disease--symptoms have been stable  History of cardiomyopathy--stable symptoms  Chronic systolic heart failure--stable  History of pulmonary hypertension He does have an AICD in place History of multiple nodules on CT in 2017, repeat CT showing pulmonary congestion, small effusions  Plan/Recommendations:  Therapeutic trial with steroids-seems to have helped, goal is to wean him to the lowest dose possible and possible discontinuation  Will see him back in the office in about 3 months Prednisone 5 mg every other day and attempt to stop in about 2 weeks  He continues to improve, encouraged to call if any significant changes in symptoms  I will see him back in 3 months   Sherrilyn Rist MD [facility] 11/20/2017, 11:38 AM

## 2018-04-18 MED ORDER — PREDNISONE 5 MG PO TABS: 5.0000 mg | ORAL_TABLET | Freq: Every day | ORAL | 5 refills | Status: DC

## 2018-04-18 NOTE — Telephone Encounter (Signed)
Yes, we can refill his prednisone

## 2018-04-18 NOTE — Telephone Encounter (Signed)
Spoke with pt's son, Octavia Bruckner. He is aware that this prescription has been refilled. Nothing further was needed at this time.

## 2018-04-27 LAB — CUP PACEART REMOTE DEVICE CHECK
Battery Remaining Longevity: 30 mo
Battery Voltage: 2.95 V
Brady Statistic AP VP Percent: 0 %
Brady Statistic AP VS Percent: 0 %
Brady Statistic AS VP Percent: 94.79 %
Brady Statistic AS VS Percent: 5.21 %
Brady Statistic RA Percent Paced: 0 %
Brady Statistic RV Percent Paced: 96.1 %
Date Time Interrogation Session: 20191218093724
HighPow Impedance: 39 Ohm
HighPow Impedance: 47 Ohm
Implantable Lead Implant Date: 20070525
Implantable Lead Implant Date: 20070525
Implantable Lead Implant Date: 20070525
Implantable Lead Location: 753858
Implantable Lead Location: 753859
Implantable Lead Location: 753860
Implantable Lead Model: 4194
Implantable Lead Model: 5076
Implantable Lead Model: 6949
Implantable Pulse Generator Implant Date: 20170116
Lead Channel Impedance Value: 190 Ohm
Lead Channel Impedance Value: 304 Ohm
Lead Channel Impedance Value: 361 Ohm
Lead Channel Impedance Value: 399 Ohm
Lead Channel Impedance Value: 456 Ohm
Lead Channel Impedance Value: 475 Ohm
Lead Channel Pacing Threshold Amplitude: 0.625 V
Lead Channel Pacing Threshold Amplitude: 1 V
Lead Channel Pacing Threshold Pulse Width: 0.4 ms
Lead Channel Pacing Threshold Pulse Width: 0.4 ms
Lead Channel Sensing Intrinsic Amplitude: 14.25 mV
Lead Channel Sensing Intrinsic Amplitude: 14.25 mV
Lead Channel Sensing Intrinsic Amplitude: 6.75 mV
Lead Channel Sensing Intrinsic Amplitude: 6.75 mV
Lead Channel Setting Pacing Amplitude: 2.5 V
Lead Channel Setting Pacing Amplitude: 2.5 V
Lead Channel Setting Pacing Pulse Width: 0.4 ms
Lead Channel Setting Pacing Pulse Width: 0.4 ms
Lead Channel Setting Sensing Sensitivity: 0.45 mV

## 2018-04-28 DIAGNOSIS — J019 Acute sinusitis, unspecified: Secondary | ICD-10-CM | POA: Diagnosis not present

## 2018-04-28 DIAGNOSIS — J320 Chronic maxillary sinusitis: Secondary | ICD-10-CM | POA: Diagnosis not present

## 2018-05-01 DIAGNOSIS — J019 Acute sinusitis, unspecified: Secondary | ICD-10-CM | POA: Diagnosis not present

## 2018-05-01 DIAGNOSIS — J349 Unspecified disorder of nose and nasal sinuses: Secondary | ICD-10-CM | POA: Diagnosis not present

## 2018-05-02 ENCOUNTER — Telehealth: Payer: Self-pay | Admitting: Cardiology

## 2018-05-02 NOTE — Telephone Encounter (Signed)
If Home Health RN is calling please get Coumadin Nurse on the phone STAT  1.  Are you calling in regards to an appointment? No   2.  Are you calling for a refill ? No   3.  Are you having bleeding issues? No   4.  Do you need clearance to hold Coumadin? No   Patient started on Levaquin and was told to get intouch with clinic re dose change please call    Please route to the [place]

## 2018-05-02 NOTE — Telephone Encounter (Signed)
Returned call to pt.  Pt was started on Levaquin on 05/01/18 500mg  QD x 10 days.  Made appt in [facility] for Monday 05/05/18 at 9am.

## 2018-05-05 ENCOUNTER — Ambulatory Visit: Payer: Managed Care, Other (non HMO)

## 2018-05-05 DIAGNOSIS — I4892 Unspecified atrial flutter: Secondary | ICD-10-CM

## 2018-05-05 DIAGNOSIS — Z7901 Long term (current) use of anticoagulants: Secondary | ICD-10-CM

## 2018-05-05 LAB — POCT INR: INR: 2.6 (ref 2.0–3.0)

## 2018-05-05 NOTE — Patient Instructions (Signed)
Please continue of 1 pill (5mg ) daily EXCEPT for 1/2 pill on Mondays, Wednesdays & Fridays. Recheck in 3 weeks.

## 2018-05-05 NOTE — Progress Notes (Signed)
Pt was prescribed Levaquin 500 mg QD x 10 days on 1/23, but reports that it caused stomach upset, so he will be cutting his dosage in half for the rest of the course (he has not discussed this w/ MD, he made this decision on his own).  Advised pt that his INR is trending upwards and since he is on Levaquin, we will need to adjust his dosage to make sure he does not get out of range.  Pt refuses to do so, as he states that his number is good and "it's been up over 3 before and it didn't hurt anything, so we're just gonna leave it where it is".  Attempted to discuss w/ pt, but he is adamant about not adjusting his coumadin dosage since he is only taking 1/2 of the recommended Levaquin dosage.

## 2018-05-26 ENCOUNTER — Other Ambulatory Visit: Payer: Self-pay | Admitting: Family Medicine

## 2018-05-26 ENCOUNTER — Ambulatory Visit: Payer: Medicare HMO

## 2018-05-26 DIAGNOSIS — Z7901 Long term (current) use of anticoagulants: Secondary | ICD-10-CM

## 2018-05-26 DIAGNOSIS — I4892 Unspecified atrial flutter: Secondary | ICD-10-CM

## 2018-05-26 LAB — POCT INR: INR: 2.3 (ref 2.0–3.0)

## 2018-05-26 NOTE — Patient Instructions (Signed)
Please continue of 1 pill (5mg ) daily EXCEPT for 1/2 pill on Mondays, Wednesdays & Fridays. Recheck in 4 weeks.

## 2018-05-26 NOTE — Telephone Encounter (Signed)
Will you address in Dr. Synthia Innocent absence?  Name of Medication: Alprazolam [facility] or Written Date and Quantity: 04/28/18, #30 Last Office Visit and Type: 01/09/18, f/u Next Office Visit and Type: 07/11/18 Last Controlled Substance Agreement Date: none Last UDS: none

## 2018-05-26 NOTE — Telephone Encounter (Signed)
I did approve--but not with refills.

## 2018-06-12 ENCOUNTER — Telehealth: Payer: Self-pay | Admitting: Family Medicine

## 2018-06-12 NOTE — Telephone Encounter (Signed)
Left message asking pt to call office please r/s 4/3 appointment with dr g °

## 2018-06-23 ENCOUNTER — Other Ambulatory Visit: Payer: Self-pay | Admitting: Cardiology

## 2018-06-23 ENCOUNTER — Ambulatory Visit: Payer: Medicare HMO

## 2018-06-23 ENCOUNTER — Other Ambulatory Visit: Payer: Self-pay

## 2018-06-23 DIAGNOSIS — I4892 Unspecified atrial flutter: Secondary | ICD-10-CM

## 2018-06-23 DIAGNOSIS — Z7901 Long term (current) use of anticoagulants: Secondary | ICD-10-CM

## 2018-06-23 LAB — POCT INR: INR: 1.9 — AB (ref 2.0–3.0)

## 2018-06-23 NOTE — Patient Instructions (Signed)
Please take 1 tablet tonight, then continue of 1 pill (5mg ) daily EXCEPT for 1/2 pill on Mondays, Wednesdays & Fridays. Recheck in 4 weeks.

## 2018-06-23 NOTE — Telephone Encounter (Signed)
° ° °*  STAT* If patient is at the pharmacy, call can be transferred to refill team.   1. Which medications need to be refilled? (please list name of each medication and dose if known) carvedilol (COREG) 6.25 MG tablet  2. Which pharmacy/location (including street and city if local pharmacy) is medication to be sent to? [id number] [place] [place] [facility] [address] 3. Do they need a 30 day or 90 day supply? [place]

## 2018-06-24 MED ORDER — CARVEDILOL 6.25 MG PO TABS: ORAL_TABLET | ORAL | 1 refills | Status: DC

## 2018-06-25 ENCOUNTER — Other Ambulatory Visit: Payer: Self-pay

## 2018-06-25 ENCOUNTER — Ambulatory Visit (INDEPENDENT_AMBULATORY_CARE_PROVIDER_SITE_OTHER): Payer: Medicare HMO | Admitting: *Deleted

## 2018-06-25 DIAGNOSIS — I428 Other cardiomyopathies: Secondary | ICD-10-CM

## 2018-06-25 DIAGNOSIS — I5022 Chronic systolic (congestive) heart failure: Secondary | ICD-10-CM

## 2018-06-25 LAB — CUP PACEART REMOTE DEVICE CHECK
Battery Remaining Longevity: 31 mo
Battery Voltage: 2.95 V
Brady Statistic AP VP Percent: 0 %
Brady Statistic AP VS Percent: 0 %
Brady Statistic AS VP Percent: 96.97 %
Brady Statistic AS VS Percent: 3.03 %
Brady Statistic RA Percent Paced: 0 %
Brady Statistic RV Percent Paced: 97.73 %
Date Time Interrogation Session: 20200318084223
HighPow Impedance: 37 Ohm
HighPow Impedance: 47 Ohm
Implantable Lead Implant Date: 20070525
Implantable Lead Implant Date: 20070525
Implantable Lead Implant Date: 20070525
Implantable Lead Location: 753858
Implantable Lead Location: 753859
Implantable Lead Location: 753860
Implantable Lead Model: 4194
Implantable Lead Model: 5076
Implantable Lead Model: 6949
Implantable Pulse Generator Implant Date: 20170116
Lead Channel Impedance Value: 171 Ohm
Lead Channel Impedance Value: 304 Ohm
Lead Channel Impedance Value: 361 Ohm
Lead Channel Impedance Value: 399 Ohm
Lead Channel Impedance Value: 418 Ohm
Lead Channel Impedance Value: 456 Ohm
Lead Channel Pacing Threshold Amplitude: 0.75 V
Lead Channel Pacing Threshold Amplitude: 0.875 V
Lead Channel Pacing Threshold Pulse Width: 0.4 ms
Lead Channel Pacing Threshold Pulse Width: 0.4 ms
Lead Channel Sensing Intrinsic Amplitude: 14.75 mV
Lead Channel Sensing Intrinsic Amplitude: 14.75 mV
Lead Channel Sensing Intrinsic Amplitude: 5.875 mV
Lead Channel Sensing Intrinsic Amplitude: 5.875 mV
Lead Channel Setting Pacing Amplitude: 2.5 V
Lead Channel Setting Pacing Amplitude: 2.5 V
Lead Channel Setting Pacing Pulse Width: 0.4 ms
Lead Channel Setting Pacing Pulse Width: 0.4 ms
Lead Channel Setting Sensing Sensitivity: 0.45 mV

## 2018-06-26 ENCOUNTER — Telehealth: Payer: Self-pay | Admitting: *Deleted

## 2018-06-26 NOTE — Telephone Encounter (Signed)
Noted on recent scheduled remote from 06/25/18 that patient's AT/AF burden is down to 9.3%, previously thought to be permanent, +warfarin. Presenting rhythm shows SR, programmed VVIR.  Routed to Dr. Lovena Le for recommendations regarding reprogramming. Next appointment with Dr. Lovena Le is on 07/30/18.

## 2018-06-27 NOTE — Telephone Encounter (Signed)
He will need to come in to have his device switched from VVI to [place]. GT

## 2018-07-03 ENCOUNTER — Encounter: Payer: Self-pay | Admitting: Cardiology

## 2018-07-03 NOTE — Telephone Encounter (Signed)
Per Dr. Lovena Le- keep April appointment unless worsening SOB

## 2018-07-03 NOTE — Progress Notes (Signed)
Remote ICD transmission.   

## 2018-07-04 ENCOUNTER — Ambulatory Visit: Payer: Medicare Other

## 2018-07-07 NOTE — Telephone Encounter (Signed)
Spoke with patient. He reports he feels great, no issues at the present time. Plan to keep appointment with Dr. Lovena Le on 07/30/18 unless he has concerns in the interim. Pt denies questions at this time and thanked me for my call.

## 2018-07-11 ENCOUNTER — Encounter: Payer: Medicare Other | Admitting: Family Medicine

## 2018-07-15 ENCOUNTER — Encounter: Payer: Medicare HMO | Admitting: Family Medicine

## 2018-07-18 ENCOUNTER — Telehealth: Payer: Self-pay

## 2018-07-18 NOTE — Telephone Encounter (Signed)
1. Do you currently have a fever? No (yes = cancel and refer to pcp for e-visit) 2. Have you recently travelled on a cruise, internationally, or to [place], [place], [place], [place], [place], or [place], [place] [employer]) ? No(yes = cancel, stay home, monitor symptoms, and contact pcp or initiate e-visit if symptoms develop) 3. Have you been in contact with someone that is currently pending confirmation of Covid19 testing or has been confirmed to have the [place] virus? No (yes = cancel, stay home, away from tested individual, monitor symptoms, and contact pcp or initiate e-visit if symptoms develop) 4. Are you currently experiencing fatigue or cough? NO (yes = pt should be prepared to have a mask placed at the time of their visit).  Spoke w/ pt's wife. Advised that we are restricting visitors at this time and anyone present in the vehicle should meet the above criteria as well. Advised that visit will be at curbside for finger stick ONLY and will receive call with instructions. Pt also advised to please bring own pen for signature of arrival document.

## 2018-07-21 ENCOUNTER — Ambulatory Visit (INDEPENDENT_AMBULATORY_CARE_PROVIDER_SITE_OTHER): Payer: Medicare HMO

## 2018-07-21 DIAGNOSIS — Z7901 Long term (current) use of anticoagulants: Secondary | ICD-10-CM

## 2018-07-21 DIAGNOSIS — I4892 Unspecified atrial flutter: Secondary | ICD-10-CM | POA: Diagnosis not present

## 2018-07-21 LAB — POCT INR: INR: 2 (ref 2.0–3.0)

## 2018-07-21 NOTE — Patient Instructions (Signed)
Please continue of 1 pill (5mg ) daily EXCEPT for 1/2 pill on Mondays, Wednesdays & Fridays. Recheck in 7 weeks.

## 2018-07-22 ENCOUNTER — Other Ambulatory Visit: Payer: Self-pay

## 2018-07-30 ENCOUNTER — Encounter: Payer: Medicare HMO | Admitting: Internal Medicine

## 2018-08-13 ENCOUNTER — Other Ambulatory Visit: Payer: Self-pay

## 2018-08-13 ENCOUNTER — Encounter: Payer: Self-pay | Admitting: Internal Medicine

## 2018-08-13 ENCOUNTER — Ambulatory Visit: Payer: Medicare HMO | Admitting: Internal Medicine

## 2018-08-13 VITALS — BP 124/72 | HR 76 | Ht 66.0 in | Wt 141.0 lb

## 2018-08-13 DIAGNOSIS — Z95810 Presence of automatic (implantable) cardiac defibrillator: Secondary | ICD-10-CM

## 2018-08-13 DIAGNOSIS — I428 Other cardiomyopathies: Secondary | ICD-10-CM

## 2018-08-13 DIAGNOSIS — I5022 Chronic systolic (congestive) heart failure: Secondary | ICD-10-CM

## 2018-08-13 DIAGNOSIS — I4821 Permanent atrial fibrillation: Secondary | ICD-10-CM

## 2018-08-13 LAB — CUP PACEART INCLINIC DEVICE CHECK
Battery Remaining Longevity: 30 mo
Battery Voltage: 2.95 V
Brady Statistic AP VP Percent: 0 %
Brady Statistic AP VS Percent: 0 %
Brady Statistic AS VP Percent: 95.51 %
Brady Statistic AS VS Percent: 4.49 %
Brady Statistic RA Percent Paced: 0 %
Brady Statistic RV Percent Paced: 96.78 %
Date Time Interrogation Session: 20200506143728
HighPow Impedance: 39 Ohm
HighPow Impedance: 50 Ohm
Implantable Lead Implant Date: 20070525
Implantable Lead Implant Date: 20070525
Implantable Lead Implant Date: 20070525
Implantable Lead Location: 753858
Implantable Lead Location: 753859
Implantable Lead Location: 753860
Implantable Lead Model: 4194
Implantable Lead Model: 5076
Implantable Lead Model: 6949
Implantable Pulse Generator Implant Date: 20170116
Lead Channel Impedance Value: 190 Ohm
Lead Channel Impedance Value: 304 Ohm
Lead Channel Impedance Value: 361 Ohm
Lead Channel Impedance Value: 418 Ohm
Lead Channel Impedance Value: 456 Ohm
Lead Channel Impedance Value: 475 Ohm
Lead Channel Pacing Threshold Amplitude: 0.75 V
Lead Channel Pacing Threshold Amplitude: 1 V
Lead Channel Pacing Threshold Amplitude: 1 V
Lead Channel Pacing Threshold Pulse Width: 0.4 ms
Lead Channel Pacing Threshold Pulse Width: 0.4 ms
Lead Channel Pacing Threshold Pulse Width: 0.4 ms
Lead Channel Sensing Intrinsic Amplitude: 2 mV
Lead Channel Setting Pacing Amplitude: 2 V
Lead Channel Setting Pacing Amplitude: 2 V
Lead Channel Setting Pacing Amplitude: 2 V
Lead Channel Setting Pacing Pulse Width: 0.4 ms
Lead Channel Setting Pacing Pulse Width: 0.4 ms
Lead Channel Setting Sensing Sensitivity: 0.45 mV

## 2018-08-13 NOTE — Patient Instructions (Signed)
Medication Instructions:  Your physician recommends that you continue on your current medications as directed. Please refer to the Current Medication list given to you today.  Labwork: None ordered.  Testing/Procedures: None ordered.  Follow-Up: Your physician wants you to follow-up in: one year with Dr. Lovena Le.   You will receive a reminder letter in the mail two months in advance. If you don't receive a letter, please call our office to schedule the follow-up appointment.  Remote monitoring is used to monitor your ICD from home. This monitoring reduces the number of office visits required to check your device to one time per year. It allows [place] to keep an eye on the functioning of your device to ensure it is working properly. You are scheduled for a device check from home on 09/24/2018. You may send your transmission at any time that day. If you have a wireless device, the transmission will be sent automatically. After your physician reviews your transmission, you will receive a postcard with your next transmission date.  Any Other Special Instructions Will Be Listed Below (If Applicable).  If you need a refill on your cardiac medications before your next appointment, please call your pharmacy.

## 2018-08-13 NOTE — Progress Notes (Signed)
HPI [name] returns today to have his PPM reprogrammed. He is a pleasant 85 yo man with a non-ischemic CM, CHB, chronic well compensated systolic heart failure, and atrial fib flutter. His device had been reprogrammed over 10 years ago as he was thought to have chronic atrial fib and he has spontaneously reverted to NSR. He cannot tell a difference.    Current Outpatient Medications  Medication Sig Dispense Refill  . acetaminophen (TYLENOL) 500 MG tablet Take 250 mg by mouth daily as needed. Pain    . ALPRAZolam (XANAX) 0.25 MG tablet TAKE 1 TABLET (0.25 MG TOTAL) BY MOUTH AT BEDTIME.* INSURANCE ONLY COVERS 30 DAYS 30 tablet 0  . alum & mag hydroxide-simeth (MAALOX/MYLANTA) 200-200-20 MG/5ML suspension Take 15 mLs by mouth daily as needed for indigestion or heartburn.    . budesonide-formoterol (SYMBICORT) 160-4.5 MCG/ACT inhaler Inhale 2 puffs into the lungs at bedtime.    . carvedilol (COREG) 6.25 MG tablet Take 1 and 1/2 tablets by mouth twice daily 270 tablet 1  . fluticasone (FLONASE) 50 MCG/ACT nasal spray Place 1 spray into both nostrils daily.    . furosemide (LASIX) 40 MG tablet Take 1 tablet (40 mg total) by mouth daily. 90 tablet 3  . guaiFENesin 200 MG tablet Take 400 mg by mouth every 4 (four) hours as needed for cough or to loosen phlegm.    . levalbuterol (XOPENEX HFA) 45 MCG/ACT inhaler Inhale 1-2 puffs into the lungs every 6 (six) hours as needed for wheezing or shortness of breath. 1 Inhaler 1  . Loratadine 10 MG CAPS Take 1 capsule by mouth daily as needed (allergies).     . montelukast (SINGULAIR) 10 MG tablet Take 10 mg by mouth daily as needed (BREATHING).     . polyethylene glycol (MIRALAX / GLYCOLAX) packet Take 17 g by mouth daily as needed (constipation).     . predniSONE (DELTASONE) 5 MG tablet Take 1 tablet (5 mg total) by mouth daily with breakfast. 30 tablet 5  . promethazine (PHENERGAN) 25 MG suppository Place 1 suppository (25 mg total) rectally  every 6 (six) hours as needed for nausea or vomiting. 12 each 3  . vitamin B-12 (CYANOCOBALAMIN) 500 MCG tablet Take 500 mg by mouth daily.    Marland Kitchen warfarin (COUMADIN) 5 MG tablet Take 1/2 to 1 tablet by mouth daily as directed by coumadin clinic 135 tablet 0   No current facility-administered medications for this visit.      Past Medical History:  Diagnosis Date  . AICD (automatic cardioverter/defibrillator) present    s/p gen change 04/2015 w/ MDT [name] Crt-D  DTBA1D1, Serial number [id number]  . Allergic rhinitis    [name]  . Anemia   . Anxiety   . Arthritis   . Atrial flutter ([place])    A.  Status post cardioversion; B.  Tikosyn therapy - failed, remains in aflutter  . Bilateral pneumonia 11/02/2013   treated with levaquin  . BPH (benign prostatic hyperplasia)   . 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 Lucky Cowboy)  . Chronic lung disease    spirometry 2015 - no obstruction, + mild restrictive lung disease  . Chronic sinusitis   . Chronic systolic congestive heart failure ([place])   . Extrinsic asthma    [name]  . Fall with injury 05/28/2017  . GERD (gastroesophageal reflux disease) 2010   h/o esophageal stricture with dilation, LA grade C reflux esophagitis by  EGD 2010  . Goiter   . History of diverticulitis of colon   . History of GI bleed    Secondary to hemorrhoids  . IBS (irritable bowel syndrome)   . LBBB (left bundle branch block)   . Multiple pulmonary nodules 06/2013   RUL ([facility]) - ?vasculitis as of last [facility]  . NICM (nonischemic cardiomyopathy) [facility])    Cardiac catheterization March 2006 without coronary disease; EF 25% 2018 Jan  . [facility])   . Sinus congestion 09/25/2010    ROS:   All systems reviewed and negative except as noted in the HPI.   Past Surgical History:  Procedure Laterality Date  . BACK SURGERY  1997   bulging disks  . BiV-AICD implant     [employer]tronic  . CARDIAC CATHETERIZATION   06/22/04   Severe, nonischemic cardiomyopathy,EF 25-30%  . CARDIOVERSION  02/22/09   AFlutter-([facility])  . CHOLECYSTECTOMY    . COLONOSCOPY  10/99   Divertic, splenic, hepatic fleure only  . COLONOSCOPY  10/21/08   aborted-divertics, int hemms (Dr. Vira Agar)  . CORONARY ANGIOPLASTY    . EP IMPLANTABLE DEVICE N/A 04/25/2015   Procedure: BIV ICD Generator Changeout;  Surgeon: Evans Lance, MD;  Location: [facility];  Service: Cardiovascular;  Laterality: N/A;  . ESOPHAGEAL DILATION  02/18/98  . ESOPHAGOGASTRODUODENOSCOPY  01/1998   stricture, sliding HH, GERD  . ESOPHAGOGASTRODUODENOSCOPY  04/08/01   stricture, gastritis, HH, GERD-no dilation(Dr. Henrene Pastor)  . ESOPHAGOGASTRODUODENOSCOPY  12/22/04   stricture; gastritis; duodenitis, GERD  . ESOPHAGOGASTRODUODENOSCOPY  10/21/08   Reflux esophagitis; Erythem. Duod. (Dr. Vira Agar)  . ESOPHAGOGASTRODUODENOSCOPY  08/2013   erythema gastric fundus - minimal gastritis, HH ([place])  . ESOPHAGOGASTRODUODENOSCOPY  08/2016   dysphagia - mod schatzki rin, dilated Tiffany Kocher)  . ESOPHAGOGASTRODUODENOSCOPY (EGD) WITH PROPOFOL N/A 08/24/2016   mod schatzki ring dilated, duodenal deformity Vira Agar, Gavin Pound, MD)  . goiter removal    . HERNIA REPAIR    . HERNIA REPAIR  01/30/02   Dr. Hassell Done  . INSERT / REPLACE / REMOVE PACEMAKER    . NOSE SURGERY  1971  . PENILE PROSTHESIS IMPLANT  2007   [place]  . PFT  10/2013   FVC 61%, FEV1 63%, ratio 0.76  . Pulmonary eval  04/2002   ([facility]) Chronic congestive symptoms  . REVERSE SHOULDER ARTHROPLASTY Right 01/17/2016   Procedure: REVERSE SHOULDER ARTHROPLASTY;  Surgeon: Corky Mull, MD;  Location: [facility] ORS;  Service: Orthopedics;  Laterality: Right;  . REVERSE TOTAL SHOULDER ARTHROPLASTY Right 01/2016   Poggi  . US ECHOCARDIOGRAPHY  07/13/04   EF 25-30%, Mod LVH; LA severe dilation; Mild AR; IRTR  . US ECHOCARDIOGRAPHY  11/27/06   hypokinesis posterior wall, EF 35%; mild AR     Family History  Problem Relation Age  of Onset  . Coronary artery disease Father   . Hypertension Father   . Heart failure Mother   . Dysphagia Sister   . Breast cancer Unknown   . Ovarian cancer Unknown   . Uterine cancer Unknown   . Other Sister        stomach problems  . Other Sister        stomach problems  . Other Sister        stomach problems  . Alcohol abuse Maternal Uncle      Social History   Socioeconomic History  . Marital status: Married    Spouse name: Not on file  .  Number of children: 3  . Years of education: Not on file  . Highest education level: Not on file  Occupational History  . Occupation: retired    Fish farm manager: RETIRED  Social Needs  . Financial resource strain: Not on file  . Food insecurity:    Worry: Not on file    Inability: Not on file  . Transportation needs:    Medical: Not on file    Non-medical: Not on file  Tobacco Use  . Smoking status: Never Smoker  . Smokeless tobacco: Never Used  Substance and Sexual Activity  . Alcohol use: No  . Drug use: No  . Sexual activity: Never  Lifestyle  . Physical activity:    Days per week: Not on file    Minutes per session: Not on file  . Stress: Not on file  Relationships  . Social connections:    Talks on phone: Not on file    Gets together: Not on file    Attends religious service: Not on file    Active member of club or organization: Not on file    Attends meetings of clubs or organizations: Not on file    Relationship status: Not on file  . Intimate partner violence:    Fear of current or ex partner: Not on file    Emotionally abused: Not on file    Physically abused: Not on file    Forced sexual activity: Not on file  Other Topics Concern  . Not on file  Social History Narrative   Lives with wife   3 children-8 grandchildren   Still works on a farm.   Retired from [employer] friend with Dr. Jefm Bryant   Activity: No regular exercise   Diet: good water, fruits/vegetables daily      BP 124/72   Pulse 76   Ht 5\' 6"  (1.676 m)   Wt 141 lb (64 kg)   BMI 22.76 kg/m   Physical Exam:  Well appearing NAD HEENT: Unremarkable Neck:  No JVD, no thyromegally Lymphatics:  No adenopathy Back:  No CVA tenderness Lungs:  Clear with no wheezes HEART:  Regular rate rhythm, no murmurs, no rubs, no clicks Abd:  soft, positive bowel sounds, no organomegally, no rebound, no guarding Ext:  2 plus pulses, no edema, no cyanosis, no clubbing Skin:  No rashes no nodules Neuro:  CN II through XII intact, motor grossly intact  EKG - NSR with CHB and ventricular pacing  DEVICE  Normal device function.  See PaceArt for details.   Assess/Plan: 1. Chronic systolic heart failure - he remains class 2. Hopefully he will fee better with his device reprogrammed. 2. ICD  - his device has been reprogrammed today to allow for AV synchrony. 3. Atrial fib- he has reverted back to NSR after 10 years! 4. Chronic anti-coagulation - he is tolerating his warfarin. Continue.   Mikle Bosworth.D.

## 2018-08-14 ENCOUNTER — Other Ambulatory Visit: Payer: Self-pay | Admitting: Cardiology

## 2018-08-14 NOTE — Telephone Encounter (Signed)
Furosemide refilled. 

## 2018-08-19 ENCOUNTER — Other Ambulatory Visit: Payer: Self-pay | Admitting: Internal Medicine

## 2018-08-21 NOTE — Telephone Encounter (Signed)
Eprescribed.

## 2018-08-27 DIAGNOSIS — Z8719 Personal history of other diseases of the digestive system: Secondary | ICD-10-CM | POA: Diagnosis not present

## 2018-08-27 DIAGNOSIS — K5909 Other constipation: Secondary | ICD-10-CM | POA: Diagnosis not present

## 2018-09-05 ENCOUNTER — Telehealth: Payer: Self-pay

## 2018-09-05 NOTE — Telephone Encounter (Signed)

## 2018-09-08 ENCOUNTER — Other Ambulatory Visit: Payer: Self-pay

## 2018-09-08 ENCOUNTER — Ambulatory Visit (INDEPENDENT_AMBULATORY_CARE_PROVIDER_SITE_OTHER): Payer: Medicare HMO

## 2018-09-08 DIAGNOSIS — I4892 Unspecified atrial flutter: Secondary | ICD-10-CM | POA: Diagnosis not present

## 2018-09-08 DIAGNOSIS — Z7901 Long term (current) use of anticoagulants: Secondary | ICD-10-CM

## 2018-09-08 DIAGNOSIS — Z5181 Encounter for therapeutic drug level monitoring: Secondary | ICD-10-CM

## 2018-09-08 LAB — POCT INR: INR: 1.8 — AB (ref 2.0–3.0)

## 2018-09-08 NOTE — Patient Instructions (Signed)
Please take a whole tablet tonight, then continue of 1 pill (5mg ) daily EXCEPT for 1/2 pill on Mondays, Wednesdays & Fridays. Recheck in 6 weeks.

## 2018-09-10 ENCOUNTER — Telehealth: Payer: Self-pay | Admitting: *Deleted

## 2018-09-10 NOTE — Telephone Encounter (Signed)
A message was left, re: follow up visit. 

## 2018-09-24 ENCOUNTER — Ambulatory Visit (INDEPENDENT_AMBULATORY_CARE_PROVIDER_SITE_OTHER): Payer: Medicare HMO | Admitting: *Deleted

## 2018-09-24 DIAGNOSIS — I428 Other cardiomyopathies: Secondary | ICD-10-CM

## 2018-09-24 DIAGNOSIS — I5022 Chronic systolic (congestive) heart failure: Secondary | ICD-10-CM

## 2018-09-24 LAB — CUP PACEART REMOTE DEVICE CHECK
Battery Remaining Longevity: 25 mo
Battery Voltage: 2.95 V
Brady Statistic AP VP Percent: 78.89 %
Brady Statistic AP VS Percent: 0.1 %
Brady Statistic AS VP Percent: 20.65 %
Brady Statistic AS VS Percent: 0.36 %
Brady Statistic RA Percent Paced: 74.59 %
Brady Statistic RV Percent Paced: 97.55 %
Date Time Interrogation Session: 20200617062703
HighPow Impedance: 39 Ohm
HighPow Impedance: 51 Ohm
Implantable Lead Implant Date: 20070525
Implantable Lead Implant Date: 20070525
Implantable Lead Implant Date: 20070525
Implantable Lead Location: 753858
Implantable Lead Location: 753859
Implantable Lead Location: 753860
Implantable Lead Model: 4194
Implantable Lead Model: 5076
Implantable Lead Model: 6949
Implantable Pulse Generator Implant Date: 20170116
Lead Channel Impedance Value: 190 Ohm
Lead Channel Impedance Value: 304 Ohm
Lead Channel Impedance Value: 361 Ohm
Lead Channel Impedance Value: 399 Ohm
Lead Channel Impedance Value: 475 Ohm
Lead Channel Impedance Value: 475 Ohm
Lead Channel Pacing Threshold Amplitude: 0.75 V
Lead Channel Pacing Threshold Amplitude: 0.875 V
Lead Channel Pacing Threshold Pulse Width: 0.4 ms
Lead Channel Pacing Threshold Pulse Width: 0.4 ms
Lead Channel Sensing Intrinsic Amplitude: 3.375 mV
Lead Channel Sensing Intrinsic Amplitude: 3.375 mV
Lead Channel Sensing Intrinsic Amplitude: 6.875 mV
Lead Channel Sensing Intrinsic Amplitude: 6.875 mV
Lead Channel Setting Pacing Amplitude: 2 V
Lead Channel Setting Pacing Amplitude: 2 V
Lead Channel Setting Pacing Amplitude: 2 V
Lead Channel Setting Pacing Pulse Width: 0.4 ms
Lead Channel Setting Pacing Pulse Width: 0.4 ms
Lead Channel Setting Sensing Sensitivity: 0.45 mV

## 2018-10-06 ENCOUNTER — Encounter: Payer: Self-pay | Admitting: Cardiology

## 2018-10-06 NOTE — Progress Notes (Signed)
Remote ICD transmission.   

## 2018-10-14 ENCOUNTER — Other Ambulatory Visit: Payer: Self-pay | Admitting: Family Medicine

## 2018-10-14 NOTE — Telephone Encounter (Signed)
Eprescribed.

## 2018-10-14 NOTE — Telephone Encounter (Signed)
Xanax last filled 08/21/2018 #30, last OV 01/2018 6 mth f/u... next OV 11/11/2018... please advise

## 2018-10-16 MED ORDER — PREDNISONE 5 MG PO TABS: 5.0000 mg | ORAL_TABLET | Freq: Every day | ORAL | 0 refills | Status: DC

## 2018-10-20 ENCOUNTER — Other Ambulatory Visit: Payer: Self-pay

## 2018-10-20 ENCOUNTER — Ambulatory Visit (INDEPENDENT_AMBULATORY_CARE_PROVIDER_SITE_OTHER): Payer: Medicare HMO

## 2018-10-20 DIAGNOSIS — Z7901 Long term (current) use of anticoagulants: Secondary | ICD-10-CM

## 2018-10-20 DIAGNOSIS — I4892 Unspecified atrial flutter: Secondary | ICD-10-CM

## 2018-10-20 DIAGNOSIS — Z5181 Encounter for therapeutic drug level monitoring: Secondary | ICD-10-CM

## 2018-10-20 LAB — POCT INR: INR: 1.9 — AB (ref 2.0–3.0)

## 2018-10-20 NOTE — Patient Instructions (Signed)
Please START NEW DOSAGE of 1 pill (5mg ) daily EXCEPT for 1/2 pill on Mondays & Fridays. Recheck in 6 weeks.

## 2018-11-03 ENCOUNTER — Other Ambulatory Visit: Payer: Self-pay

## 2018-11-03 ENCOUNTER — Encounter: Payer: Self-pay | Admitting: Urology

## 2018-11-03 ENCOUNTER — Ambulatory Visit: Payer: Medicare HMO | Admitting: Urology

## 2018-11-03 VITALS — BP 116/76 | HR 66 | Ht 66.0 in | Wt 136.0 lb

## 2018-11-03 DIAGNOSIS — N401 Enlarged prostate with lower urinary tract symptoms: Secondary | ICD-10-CM | POA: Diagnosis not present

## 2018-11-03 DIAGNOSIS — R351 Nocturia: Secondary | ICD-10-CM | POA: Diagnosis not present

## 2018-11-03 LAB — BLADDER SCAN AMB NON-IMAGING

## 2018-11-03 MED ORDER — TAMSULOSIN HCL 0.4 MG PO CAPS: 0.4000 mg | ORAL_CAPSULE | Freq: Every day | ORAL | 11 refills | Status: DC

## 2018-11-03 NOTE — Patient Instructions (Signed)

## 2018-11-03 NOTE — Progress Notes (Signed)
11/03/18 12:39 PM   [name] [birth year] [id number]  Referring provider: Ria Bush, MD [place],  [place] [address]  CC: BPH, urinary symptoms  HPI: I saw [name] in urology clinic today in consultation from Dr. Danise Mina for BPH and urinary symptoms.  He is an 85 year old male with cardiac history and CHF on Coumadin and 40 mg furosemide daily who presents with worsening urinary symptoms.  His primary complaint is weak urinary stream in the evenings, and difficulty starting his stream or straining overnight.  He does have some issues with weak stream and frequency as well.  He denies any urinary incontinence.  He is not on any medications for BPH.  He reportedly has a history of 3 negative prostate biopsies in the past, however these records are not available to me.  He has an inflatable penile prosthesis that was placed by Dr. Karsten Ro over 15 years ago at [facility] in [place].  This is still functional.  He denies any gross hematuria, history of urinary retention, or history of UTIs.  There are no aggravating or alleviating factors.  Severity is mild.   PVR in clinic today 28 cc.  Urinalysis 0-5 WBCs, 0-2 RBCs, no bacteria, nitrite negative.  IPSS score today is 17, with quality of life mostly satisfied.   PMH: Past Medical History:  Diagnosis Date  . AICD (automatic cardioverter/defibrillator) present    s/p gen change 04/2015 w/ MDT [name] Crt-D  DTBA1D1, Serial number [id number]  . Allergic rhinitis    [name]  . Anemia   . Anxiety   . Arthritis   . Atrial flutter ([place])    A.  Status post cardioversion; B.  Tikosyn therapy - failed, remains in aflutter  . Bilateral pneumonia 11/02/2013   treated with levaquin  . BPH (benign prostatic hyperplasia)   . 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 Lucky Cowboy)  . Chronic lung disease    spirometry 2015 - no obstruction, + mild restrictive lung disease  . Chronic  sinusitis   . Chronic systolic congestive heart failure ([place])   . Extrinsic asthma    [name]  . Fall with injury 05/28/2017  . GERD (gastroesophageal reflux disease) 2010   h/o esophageal stricture with dilation, LA grade C reflux esophagitis by EGD 2010  . Goiter   . History of diverticulitis of colon   . History of GI bleed    Secondary to hemorrhoids  . IBS (irritable bowel syndrome)   . LBBB (left bundle branch block)   . Multiple pulmonary nodules 06/2013   RUL ([facility] Adam [facility]ix [facility]) - ?vasculitis as of [facility] CT [facility]  . NICM (nonischemic cardiomyopathy) [facility])    Cardiac catheterization March 2006 without coronary disease; EF 25% 2018 Jan  . [facility])   . Sinus congestion 09/25/2010    Surgical History: Past Surgical History:  Procedure Laterality Date  . BACK SURGERY  1997   bulging disks  . BiV-AICD implant     [employer]tr[employer]  . CARDIAC CATHETERIZATION  06/22/04   Severe, nonischemic cardiomyopathy,EF 25-30%  . CARDIOVERSION  02/22/09   AFlutter-([facility])  . CHOLECYSTECTOMY    . COLONOSCOPY  10/99   Divertic, splenic, hepatic fleure only  . COLONOSCOPY  10/21/08   aborted-divertics, int hemms (Dr. Vira Agar)  . CORONARY ANGIOPLASTY    . EP IMPLANTABLE DEVICE N/A 04/25/2015   Procedure: BIV ICD Generator Changeout;  Surgeon: Carleene Overlie  Peyton Najjar, MD;  Location: [facility];  Service: Cardiovascular;  Laterality: N/A;  . ESOPHAGEAL DILATION  02/18/98  . ESOPHAGOGASTRODUODENOSCOPY  01/1998   stricture, sliding HH, GERD  . ESOPHAGOGASTRODUODENOSCOPY  04/08/01   stricture, gastritis, HH, GERD-no dilation(Dr. Henrene Pastor)  . ESOPHAGOGASTRODUODENOSCOPY  12/22/04   stricture; gastritis; duodenitis, GERD  . ESOPHAGOGASTRODUODENOSCOPY  10/21/08   Reflux esophagitis; Erythem. Duod. (Dr. Vira Agar)  . ESOPHAGOGASTRODUODENOSCOPY  08/2013   erythema gastric fundus - minimal gastritis, HH ([facility])  . ESOPHAGOGASTRODUODENOSCOPY  08/2016   dysphagia - mod schatzki rin, dilated  Tiffany Kocher)  . ESOPHAGOGASTRODUODENOSCOPY (EGD) WITH PROPOFOL N/A 08/24/2016   mod schatzki ring dilated, duodenal deformity Vira Agar, Gavin Pound, MD)  . goiter removal    . HERNIA REPAIR    . HERNIA REPAIR  01/30/02   Dr. Hassell Done  . INSERT / REPLACE / REMOVE PACEMAKER    . NOSE SURGERY  1971  . PENILE PROSTHESIS IMPLANT  2007   [place]  . PFT  10/2013   FVC 61%, FEV1 63%, ratio 0.76  . Pulmonary eval  04/2002   ([facility]) Chronic congestive symptoms  . REVERSE SHOULDER ARTHROPLASTY Right 01/17/2016   Procedure: REVERSE SHOULDER ARTHROPLASTY;  Surgeon: Corky Mull, MD;  Location: [facility] OR[facility];  Service: Orthopedics;  Laterality: Right;  . REVERSE TOTAL SHOULDER ARTHROPLASTY Right 01/2016   Poggi  . US ECHOCARDIOGRAPHY  07/13/04   EF 25-30%, Mod LVH; LA severe dilation; Mild AR; IRTR  . US ECHOCARDIOGRAPHY  11/27/06   hypokinesis posterior wall, EF 35%; mild AR   Family History: Family History  Problem Relation Age of Onset  . Coronary artery disease Father   . Hypertension Father   . Heart failure Mother   . Dysphagia Sister   . Breast cancer Unknown   . Ovarian cancer Unknown   . Uterine cancer Unknown   . Other Sister        stomach problems  . Other Sister        stomach problems  . Other Sister        stomach problems  . Alcohol abuse Maternal Uncle     Social History:  reports that he has never smoked. He has never used smokeless tobacco. He reports that he does not drink alcohol or use drugs.  ROS: Please see flowsheet from today's date for complete review of systems.  Physical Exam: BP 116/76 (BP Location: Left Arm, Patient Position: Sitting)   Pulse 66   Ht 5\' 6"  (1.676 m)   Wt 136 lb (61.7 kg)   BMI 21.95 kg/m    Constitutional:  Alert and oriented, No acute distress. Cardiovascular: No clubbing, cyanosis, or edema. Respiratory: Normal respiratory effort, no increased work of breathing. GI: Abdomen is soft, nontender, nondistended, no abdominal masses GU: phallus  without lesions, widely patent meatus, IPP in place with no signs of erythema  DRE: 60g, smooth, no nodules Lymph: No cervical or inguinal lymphadenopathy. Skin: No rashes, bruises or suspicious lesions. Neurologic: Grossly intact, no focal deficits, moving all 4 extremities. Psychiatric: Normal mood and affect.  Laboratory Data: Reviewed, see HPI  Pertinent Imaging: No cross-sectional imaging to evaluate prostate volume  Assessment & Plan:   In summary, the patient is an 85 year old male with CHF on 40 mg furosemide daily who presents with some mild urinary symptoms, primarily nocturia and weak stream in the evenings.  We discussed at length the role of furosemide and urinary symptoms.  He would like to pursue a trial of Flomax  for his urinary symptoms. Behavioral strategies discussed regarding nocturia.  With his co-morbidities, I would not aggressively pursue an outlet procedure unless the patient had retention or severely worsening urinary symptoms.   Trial of Flomax 0.4 mg nightly-risks and benefits discussed Consider finasteride in the future if fails Flomax RTC 8 weeks for symptom check  Billey Co, MD  [facility] [address] [place], [place] [address] [phone]

## 2018-11-04 LAB — URINALYSIS, COMPLETE
Bilirubin, UA: NEGATIVE
Leukocytes,UA: NEGATIVE
Nitrite, UA: NEGATIVE
Specific Gravity, UA: 1.02 (ref 1.005–1.030)
Urobilinogen, Ur: 4 mg/dL — ABNORMAL HIGH (ref 0.2–1.0)
pH, UA: 5 (ref 5.0–7.5)

## 2018-11-04 LAB — MICROSCOPIC EXAMINATION: Bacteria, UA: NONE SEEN

## 2018-11-06 ENCOUNTER — Ambulatory Visit: Payer: Medicare HMO

## 2018-11-06 ENCOUNTER — Other Ambulatory Visit: Payer: Self-pay

## 2018-11-06 ENCOUNTER — Other Ambulatory Visit (INDEPENDENT_AMBULATORY_CARE_PROVIDER_SITE_OTHER): Payer: Medicare HMO

## 2018-11-06 ENCOUNTER — Other Ambulatory Visit: Payer: Self-pay | Admitting: Family Medicine

## 2018-11-06 DIAGNOSIS — Z13220 Encounter for screening for lipoid disorders: Secondary | ICD-10-CM

## 2018-11-06 DIAGNOSIS — E538 Deficiency of other specified B group vitamins: Secondary | ICD-10-CM

## 2018-11-06 DIAGNOSIS — D696 Thrombocytopenia, unspecified: Secondary | ICD-10-CM

## 2018-11-06 DIAGNOSIS — R739 Hyperglycemia, unspecified: Secondary | ICD-10-CM | POA: Diagnosis not present

## 2018-11-06 LAB — COMPREHENSIVE METABOLIC PANEL
ALT: 12 U/L (ref 0–53)
AST: 19 U/L (ref 0–37)
Albumin: 4.2 g/dL (ref 3.5–5.2)
Alkaline Phosphatase: 62 U/L (ref 39–117)
BUN: 20 mg/dL (ref 6–23)
CO2: 32 mEq/L (ref 19–32)
Calcium: 9.3 mg/dL (ref 8.4–10.5)
Chloride: 101 mEq/L (ref 96–112)
Creatinine, Ser: 1.25 mg/dL (ref 0.40–1.50)
GFR: 54.88 mL/min — ABNORMAL LOW (ref >60.00)
Glucose, Bld: 102 mg/dL — ABNORMAL HIGH (ref 70–99)
Potassium: 4.4 mEq/L (ref 3.5–5.1)
Sodium: 140 mEq/L (ref 135–145)
Total Bilirubin: 1 mg/dL (ref 0.2–1.2)
Total Protein: 6.5 g/dL (ref 6.0–8.3)

## 2018-11-06 LAB — CBC WITH DIFFERENTIAL/PLATELET
Basophils Absolute: 0.1 10*3/uL (ref 0.0–0.1)
Basophils Relative: 0.9 % (ref 0.0–3.0)
Eosinophils Absolute: 0.1 10*3/uL (ref 0.0–0.7)
Eosinophils Relative: 1.1 % (ref 0.0–5.0)
HCT: 39 % (ref 39.0–52.0)
Hemoglobin: 13.1 g/dL (ref 13.0–17.0)
Lymphocytes Relative: 15.9 % (ref 12.0–46.0)
Lymphs Abs: 1 10*3/uL (ref 0.7–4.0)
MCHC: 33.7 g/dL (ref 30.0–36.0)
MCV: 89.2 fl (ref 78.0–100.0)
Monocytes Absolute: 0.4 10*3/uL (ref 0.1–1.0)
Monocytes Relative: 6.4 % (ref 3.0–12.0)
Neutro Abs: 4.8 10*3/uL (ref 1.4–7.7)
Neutrophils Relative %: 75.7 % (ref 43.0–77.0)
Platelets: 116 10*3/uL — ABNORMAL LOW (ref 150.0–400.0)
RBC: 4.38 Mil/uL (ref 4.22–5.81)
RDW: 16 % — ABNORMAL HIGH (ref 11.5–15.5)
WBC: 6.3 10*3/uL (ref 4.0–10.5)

## 2018-11-06 LAB — LIPID PANEL
Cholesterol: 183 mg/dL (ref 0–200)
HDL: 49.5 mg/dL (ref >39.00)
LDL Cholesterol: 107 mg/dL — ABNORMAL HIGH (ref 0–99)
NonHDL: 133.42
Total CHOL/HDL Ratio: 4
Triglycerides: 130 mg/dL (ref 0.0–149.0)
VLDL: 26 mg/dL (ref 0.0–40.0)

## 2018-11-06 LAB — TSH: TSH: 1.96 u[IU]/mL (ref 0.35–4.50)

## 2018-11-06 LAB — HEMOGLOBIN A1C: Hgb A1c MFr Bld: 6.2 % (ref 4.6–6.5)

## 2018-11-06 LAB — VITAMIN B12: Vitamin B-12: 176 pg/mL — ABNORMAL LOW (ref 211–911)

## 2018-11-07 ENCOUNTER — Ambulatory Visit (INDEPENDENT_AMBULATORY_CARE_PROVIDER_SITE_OTHER): Payer: Medicare HMO

## 2018-11-07 VITALS — HR 92 | Ht 66.0 in | Wt 135.0 lb

## 2018-11-07 DIAGNOSIS — Z0000 Encounter for general adult medical examination without abnormal findings: Secondary | ICD-10-CM

## 2018-11-07 NOTE — Patient Instructions (Signed)
[name] , Thank you for taking time to come for your Medicare Wellness Visit. I appreciate your ongoing commitment to your health goals. Please review the following plan we discussed and let me know if I can assist you in the future.   Screening recommendations/referrals: Colonoscopy: not required Recommended yearly ophthalmology/optometry visit for glaucoma screening and checkup Recommended yearly dental visit for hygiene and checkup  Vaccinations: Influenza vaccine: postponed Pneumococcal vaccine: postponed Tdap vaccine: 05/2017 Shingles vaccine: discussed    Advanced directives: Please bring a copy of your POA (Power of Attorney) and/or Living Will to your next appointment.    Conditions/risks identified: HTN  Next appointment: 11/11/2018 at 3:00  Preventive Care 79 Years and Older, Male Preventive care refers to lifestyle choices and visits with your health care provider that can promote health and wellness. What does preventive care include?  A yearly physical exam. This is also called an annual well check.  Dental exams once or twice a year.  Routine eye exams. Ask your health care provider how often you should have your eyes checked.  Personal lifestyle choices, including:  Daily care of your teeth and gums.  Regular physical activity.  Eating a healthy diet.  Avoiding tobacco and drug use.  Limiting alcohol use.  Practicing safe sex.  Taking low doses of aspirin every day.  Taking vitamin and mineral supplements as recommended by your health care provider. What happens during an annual well check? The services and screenings done by your health care provider during your annual well check will depend on your age, overall health, lifestyle risk factors, and family history of disease. Counseling  Your health care provider may ask you questions about your:  Alcohol use.  Tobacco use.  Drug use.  Emotional well-being.  Home and relationship  well-being.  Sexual activity.  Eating habits.  History of falls.  Memory and ability to understand (cognition).  Work and work Statistician. Screening  You may have the following tests or measurements:  Height, weight, and BMI.  Blood pressure.  Lipid and cholesterol levels. These may be checked every 5 years, or more frequently if you are over 59 years old.  Skin check.  Lung cancer screening. You may have this screening every year starting at age 77 if you have a 30-pack-year history of smoking and currently smoke or have quit within the past 15 years.  Fecal occult blood test (FOBT) of the stool. You may have this test every year starting at age 26.  Flexible sigmoidoscopy or colonoscopy. You may have a sigmoidoscopy every 5 years or a colonoscopy every 10 years starting at age 71.  Prostate cancer screening. Recommendations will vary depending on your family history and other risks.  Hepatitis C blood test.  Hepatitis B blood test.  Sexually transmitted disease (STD) testing.  Diabetes screening. This is done by checking your blood sugar (glucose) after you have not eaten for a while (fasting). You may have this done every 1-3 years.  Abdominal aortic aneurysm (AAA) screening. You may need this if you are a current or former smoker.  Osteoporosis. You may be screened starting at age 56 if you are at high risk. Talk with your health care provider about your test results, treatment options, and if necessary, the need for more tests. Vaccines  Your health care provider may recommend certain vaccines, such as:  Influenza vaccine. This is recommended every year.  Tetanus, diphtheria, and acellular pertussis (Tdap, Td) vaccine. You may need a Td booster  every 10 years.  Zoster vaccine. You may need this after age 18.  Pneumococcal 13-valent conjugate (PCV13) vaccine. One dose is recommended after age 67.  Pneumococcal polysaccharide (PPSV23) vaccine. One dose is  recommended after age 15. Talk to your health care provider about which screenings and vaccines you need and how often you need them. This information is not intended to replace advice given to you by your health care provider. Make sure you discuss any questions you have with your health care provider. Document Released: 04/22/2015 Document Revised: 12/14/2015 Document Reviewed: 01/25/2015 Elsevier Interactive Patient Education  2017 [place] Prevention in the Home Falls can cause injuries. They can happen to people of all ages. There are many things you can do to make your home safe and to help prevent falls. What can I do on the outside of my home?  Regularly fix the edges of walkways and driveways and fix any cracks.  Remove anything that might make you trip as you walk through a door, such as a raised step or threshold.  Trim any bushes or trees on the path to your home.  Use bright outdoor lighting.  Clear any walking paths of anything that might make someone trip, such as rocks or tools.  Regularly check to see if handrails are loose or broken. Make sure that both sides of any steps have handrails.  Any raised decks and porches should have guardrails on the edges.  Have any leaves, snow, or ice cleared regularly.  Use sand or salt on walking paths during winter.  Clean up any spills in your garage right away. This includes oil or grease spills. What can I do in the bathroom?  Use night lights.  Install grab bars by the toilet and in the tub and shower. Do not use towel bars as grab bars.  Use non-skid mats or decals in the tub or shower.  If you need to sit down in the shower, use a plastic, non-slip stool.  Keep the floor dry. Clean up any water that spills on the floor as soon as it happens.  Remove soap buildup in the tub or shower regularly.  Attach bath mats securely with double-sided non-slip rug tape.  Do not have throw rugs and other things on  the floor that can make you trip. What can I do in the bedroom?  Use night lights.  Make sure that you have a light by your bed that is easy to reach.  Do not use any sheets or blankets that are too big for your bed. They should not hang down onto the floor.  Have a firm chair that has side arms. You can use this for support while you get dressed.  Do not have throw rugs and other things on the floor that can make you trip. What can I do in the kitchen?  Clean up any spills right away.  Avoid walking on wet floors.  Keep items that you use a lot in easy-to-reach places.  If you need to reach something above you, use a strong step stool that has a grab bar.  Keep electrical cords out of the way.  Do not use floor polish or wax that makes floors slippery. If you must use wax, use non-skid floor wax.  Do not have throw rugs and other things on the floor that can make you trip. What can I do with my stairs?  Do not leave any items on the stairs.  Make  sure that there are handrails on both sides of the stairs and use them. Fix handrails that are broken or loose. Make sure that handrails are as long as the stairways.  Check any carpeting to make sure that it is firmly attached to the stairs. Fix any carpet that is loose or worn.  Avoid having throw rugs at the top or bottom of the stairs. If you do have throw rugs, attach them to the floor with carpet tape.  Make sure that you have a light switch at the top of the stairs and the bottom of the stairs. If you do not have them, ask someone to add them for you. What else can I do to help prevent falls?  Wear shoes that:  Do not have high heels.  Have rubber bottoms.  Are comfortable and fit you well.  Are closed at the toe. Do not wear sandals.  If you use a stepladder:  Make sure that it is fully opened. Do not climb a closed stepladder.  Make sure that both sides of the stepladder are locked into place.  Ask someone to  hold it for you, if possible.  Clearly mark and make sure that you can see:  Any grab bars or handrails.  First and last steps.  Where the edge of each step is.  Use tools that help you move around (mobility aids) if they are needed. These include:  Canes.  Walkers.  Scooters.  Crutches.  Turn on the lights when you go into a dark area. Replace any light bulbs as soon as they burn out.  Set up your furniture so you have a clear path. Avoid moving your furniture around.  If any of your floors are uneven, fix them.  If there are any pets around you, be aware of where they are.  Review your medicines with your doctor. Some medicines can make you feel dizzy. This can increase your chance of falling. Ask your doctor what other things that you can do to help prevent falls. This information is not intended to replace advice given to you by your health care provider. Make sure you discuss any questions you have with your health care provider. Document Released: 01/20/2009 Document Revised: 09/01/2015 Document Reviewed: 04/30/2014 Elsevier Interactive Patient Education  2017 [employer].

## 2018-11-07 NOTE — Progress Notes (Signed)
PCP notes:  Health Maintenance:  No gaps  Abnormal Screenings:  Mini-Cog: 16/22  Patient concerns:  None  Nurse concerns:  None  Next PCP appt.: 11/11/2018 at 3:00

## 2018-11-07 NOTE — Progress Notes (Signed)
Subjective:   [name] is a 85 y.o. male who presents for Medicare Annual/Subsequent preventive examination.  This visit type was conducted due to national recommendations for restrictions regarding the COVID-19 Pandemic (e.g. social distancing). This format is felt to be most appropriate for this patient at this time. All issues noted in this document were discussed and addressed. No physical exam was performed (except for noted visual exam findings with Video Visits). This patient, [name], has given permission to perform this visit via telephone. Vital signs may be absent or patient reported.  Patient location:  At home  Nurse location:  At home     Review of Systems:  n/a Cardiac Risk Factors include: advanced age (>49men, >40 women);hypertension;male gender     Objective:    Vitals: Pulse 92 Comment: per patient  Ht 5\' 6"  (1.676 m) Comment: per patient  Wt 135 lb (61.2 kg) Comment: per patient  SpO2 97% Comment: per patient  BMI 21.79 kg/m   Body mass index is 21.79 kg/m.  Advanced Directives 11/07/2018 07/02/2017 05/24/2017 08/24/2016 06/29/2016 01/18/2016 01/17/2016  Does Patient Have a Medical Advance Directive? Yes Yes Yes Yes Yes Yes Yes  Type of Paramedic of [place];Living will [place];Living will Healthcare Power of [place] Living will [place];Living will Living will;Healthcare Power of Attorney Living will;Healthcare Power of Attorney  Does patient want to make changes to medical advance directive? No - Patient declined - - - - No - Patient declined No - Patient declined  Copy of [place] in Chart? No - copy requested No - copy requested No - copy requested - No - copy requested No - copy requested No - copy requested    Tobacco Social History   Tobacco Use  Smoking Status Never Smoker  Smokeless Tobacco Never Used     Counseling given: Not  Answered   Clinical Intake:  Pre-visit preparation completed: Yes  Pain : No/denies pain     Nutritional Status: BMI of 19-24  Normal Nutritional Risks: None Diabetes: No  How often do you need to have someone help you when you read instructions, pamphlets, or other written materials from your doctor or pharmacy?: 1 - Never What is the last grade level you completed in school?: 11th grade  Interpreter Needed?: No  Information entered by :: NAllen LPN  Past Medical History:  Diagnosis Date  . AICD (automatic cardioverter/defibrillator) present    s/p gen change 04/2015 w/ MDT [name] Crt-D  DTBA1D1, Serial number [id number]  . Allergic rhinitis    [name]  . Anemia   . Anxiety   . Arthritis   . Atrial flutter ([place])    A.  Status post cardioversion; B.  Tikosyn therapy - failed, remains in aflutter  . Bilateral pneumonia 11/02/2013   treated with levaquin  . BPH (benign prostatic hyperplasia)   . 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 Lucky Cowboy)  . Chronic lung disease    spirometry 2015 - no obstruction, + mild restrictive lung disease  . Chronic sinusitis   . Chronic systolic congestive heart failure ([place])   . Extrinsic asthma    [name]  . Fall with injury 05/28/2017  . GERD (gastroesophageal reflux disease) 2010   h/o esophageal stricture with dilation, LA grade C reflux esophagitis by EGD 2010  . Goiter   . History of diverticulitis of colon   . History  of GI bleed    Secondary to hemorrhoids  . IBS (irritable bowel syndrome)   . LBBB (left bundle branch block)   . Multiple pulmonary nodules 06/2013   RUL ([facility] Adam Phenix [facility]) - ?vasculitis as of last CT at [facility]  . NICM (nonischemic cardiomyopathy) [facility])    Cardiac catheterization March 2006 without coronary disease; EF 25% 2018 Jan  . [facility])   . Sinus congestion 09/25/2010   Past Surgical History:  Procedure Laterality Date  . BACK SURGERY  1997   bulging disks   . BiV-AICD implant     [employer]  . CARDIAC CATHETERIZATION  06/22/04   Severe, nonischemic cardiomyopathy,EF 25-30%  . CARDIOVERSION  02/22/09   AFlutter-([facility])  . CHOLECYSTECTOMY    . COLONOSCOPY  10/99   Divertic, splenic, hepatic fleure only  . COLONOSCOPY  10/21/08   aborted-divertics, int hemms (Dr. Vira Agar)  . CORONARY ANGIOPLASTY    . EP IMPLANTABLE DEVICE N/A 04/25/2015   Procedure: BIV ICD Generator Changeout;  Surgeon: Evans Lance, MD;  Location: [facility];  Service: Cardiovascular;  Laterality: N/A;  . ESOPHAGEAL DILATION  02/18/98  . ESOPHAGOGASTRODUODENOSCOPY  01/1998   stricture, sliding HH, GERD  . ESOPHAGOGASTRODUODENOSCOPY  04/08/01   stricture, gastritis, HH, GERD-no dilation(Dr. Henrene Pastor)  . ESOPHAGOGASTRODUODENOSCOPY  12/22/04   stricture; gastritis; duodenitis, GERD  . ESOPHAGOGASTRODUODENOSCOPY  10/21/08   Reflux esophagitis; Erythem. Duod. (Dr. Vira Agar)  . ESOPHAGOGASTRODUODENOSCOPY  08/2013   erythema gastric fundus - minimal gastritis, HH ([place])  . ESOPHAGOGASTRODUODENOSCOPY  08/2016   dysphagia - mod schatzki rin, dilated Tiffany Kocher)  . ESOPHAGOGASTRODUODENOSCOPY (EGD) WITH PROPOFOL N/A 08/24/2016   mod schatzki ring dilated, duodenal deformity Vira Agar, Gavin Pound, MD)  . goiter removal    . HERNIA REPAIR    . HERNIA REPAIR  01/30/02   Dr. Hassell Done  . INSERT / REPLACE / REMOVE PACEMAKER    . NOSE SURGERY  1971  . PENILE PROSTHESIS IMPLANT  2007   [place]  . PFT  10/2013   FVC 61%, FEV1 63%, ratio 0.76  . Pulmonary eval  04/2002   ([facility]) Chronic congestive symptoms  . REVERSE SHOULDER ARTHROPLASTY Right 01/17/2016   Procedure: REVERSE SHOULDER ARTHROPLASTY;  Surgeon: Corky Mull, MD;  Location: [facility] ORS;  Service: Orthopedics;  Laterality: Right;  . REVERSE TOTAL SHOULDER ARTHROPLASTY Right 01/2016   Poggi  . US ECHOCARDIOGRAPHY  07/13/04   EF 25-30%, Mod LVH; LA severe dilation; Mild AR; IRTR  . US ECHOCARDIOGRAPHY  11/27/06   hypokinesis posterior  wall, EF 35%; mild AR   Family History  Problem Relation Age of Onset  . Coronary artery disease Father   . Hypertension Father   . Heart failure Mother   . Dysphagia Sister   . Breast cancer Other   . Ovarian cancer Other   . Uterine cancer Other   . Other Sister        stomach problems  . Other Sister        stomach problems  . Other Sister        stomach problems  . Alcohol abuse Maternal Uncle    Social History   Socioeconomic History  . Marital status: Married    Spouse name: Not on file  . Number of children: 3  . Years of education: Not on file  . Highest education level: Not on file  Occupational History  . Occupation: retired    Fish farm manager: RETIRED  Social Needs  .  Financial resource strain: Not hard at all  . Food insecurity    Worry: Never true    Inability: Never true  . Transportation needs    Medical: No    Non-medical: No  Tobacco Use  . Smoking status: Never Smoker  . Smokeless tobacco: Never Used  Substance and Sexual Activity  . Alcohol use: No  . Drug use: No  . Sexual activity: Yes  Lifestyle  . Physical activity    Days per week: 0 days    Minutes per session: 0 min  . Stress: Not at all  Relationships  . Social Herbalist on phone: Not on file    Gets together: Not on file    Attends religious service: Not on file    Active member of club or organization: Not on file    Attends meetings of clubs or organizations: Not on file    Relationship status: Not on file  Other Topics Concern  . Not on file  Social History Narrative   Lives with wife   3 children-8 grandchildren   Still works on a farm.   Retired from [place] friend with Dr. Jefm Bryant   Activity: No regular exercise   Diet: good water, fruits/vegetables daily    Outpatient Encounter Medications as of 11/07/2018  Medication Sig  . acetaminophen (TYLENOL) 500 MG tablet Take 250 mg by mouth daily as needed. Pain  . ALPRAZolam  (XANAX) 0.25 MG tablet TAKE 1 TABLET (0.25 MG TOTAL) BY MOUTH AT BEDTIME.* INSURANCE ONLY COVERS 30 DAYS  . alum & mag hydroxide-simeth (MAALOX/MYLANTA) 200-200-20 MG/5ML suspension Take 15 mLs by mouth daily as needed for indigestion or heartburn.  . budesonide-formoterol (SYMBICORT) 160-4.5 MCG/ACT inhaler Inhale 2 puffs into the lungs at bedtime.  . carvedilol (COREG) 6.25 MG tablet Take 1 and 1/2 tablets by mouth twice daily  . fluticasone (FLONASE) 50 MCG/ACT nasal spray Place 1 spray into both nostrils daily.  . furosemide (LASIX) 40 MG tablet TAKE 1 TABLET BY MOUTH EVERY DAY  . guaiFENesin 200 MG tablet Take 400 mg by mouth every 4 (four) hours as needed for cough or to loosen phlegm.  . levalbuterol (XOPENEX HFA) 45 MCG/ACT inhaler Inhale 1-2 puffs into the lungs every 6 (six) hours as needed for wheezing or shortness of breath.  . Loratadine 10 MG CAPS Take 1 capsule by mouth daily as needed (allergies).   . montelukast (SINGULAIR) 10 MG tablet Take 10 mg by mouth daily as needed (BREATHING).   . polyethylene glycol (MIRALAX / GLYCOLAX) packet Take 17 g by mouth daily as needed (constipation).   . predniSONE (DELTASONE) 5 MG tablet Take 1 tablet (5 mg total) by mouth daily with breakfast.  . promethazine (PHENERGAN) 25 MG suppository Place 1 suppository (25 mg total) rectally every 6 (six) hours as needed for nausea or vomiting.  . tamsulosin (FLOMAX) 0.4 MG CAPS capsule Take 1 capsule (0.4 mg total) by mouth daily.  . vitamin B-12 (CYANOCOBALAMIN) 500 MCG tablet Take 500 mg by mouth daily.  Marland Kitchen warfarin (COUMADIN) 5 MG tablet Take 1/2 to 1 tablet by mouth daily as directed by coumadin clinic   No facility-administered encounter medications on file as of 11/07/2018.     Activities of Daily Living In your present state of health, do you have any difficulty performing the following activities: 11/07/2018  Hearing? Y  Comment right ear gives trouble  Vision? Y  Comment blurriness  with  right eye  Difficulty concentrating or making decisions? N  Walking or climbing stairs? N  Dressing or bathing? N  Doing errands, shopping? N  Preparing Food and eating ? N  Using the Toilet? N  In the past six months, have you accidently leaked urine? N  Do you have problems with loss of bowel control? N  Managing your Medications? N  Managing your Finances? N  Housekeeping or managing your Housekeeping? N  Some recent data might be hidden    Patient Care Team: Ria Bush, MD as PCP - General Evans Lance, MD as Consulting Physician (Cardiology)   Assessment:   This is a routine wellness examination for [name].  Exercise Activities and Dietary recommendations Current Exercise Habits: The patient has a physically strenuous job, but has no regular exercise apart from work.  Goals    . Follow up with Primary Care Provider     Starting 07/02/2017, I will continue to take medications as prescribed and to keep appointments with PCP as scheduled.     . 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     . Patient Stated     11/07/2018, wants to live every day he can       Fall Risk Fall Risk  11/07/2018 07/02/2017 06/29/2016 04/12/2015 04/12/2015  Falls in the past year? 0 Yes No No No  Comment - tripped and fell while shopping - - -  Number falls in past yr: - 1 - - -  Injury with Fall? - Yes - - -  Risk for fall due to : Medication side effect - - - -  Follow up Falls evaluation completed;Falls prevention discussed - - - -   Is the patient's home free of loose throw rugs in walkways, pet beds, electrical cords, etc?   yes      Grab bars in the bathroom? no      Handrails on the stairs?   no      Adequate lighting?   yes  Timed Get Up and Go Performed: n/a  Depression Screen PHQ 2/9 Scores 11/07/2018 01/09/2018 07/02/2017 06/29/2016  PHQ - 2 Score 0 0 0 0  PHQ- 9 Score 0 1 0 -    Cognitive Function MMSE -  Mini Mental State Exam 11/07/2018 07/02/2017 06/29/2016  Orientation to time 5 5 5   Orientation to Place 5 5 5   Registration 3 3 3   Attention/ Calculation 0 0 0  Recall 3 3 3   Language- name 2 objects 0 0 0  Language- repeat 0 1 1  Language- repeat-comments has some trouble with hearing - -  Language- follow 3 step command 0 3 3  Language- read & follow direction 0 0 0  Write a sentence 0 0 0  Copy design 0 0 0  Total score 16 20 20    Mini Cog  Mini-Cog screen was completed. Maximum score is 22. A value of 0 denotes this part of the MMSE was not completed or the patient failed this part of the Mini-Cog screening.       Immunization History  Administered Date(s) Administered  . Td 11/08/1995, 02/16/2010  . Tdap 05/24/2017  . Zoster 05/31/2006    Qualifies for Shingles Vaccine?  yes  Screening Tests Health Maintenance  Topic Date Due  . INFLUENZA VACCINE  06/29/2025 (Originally 11/08/2018)  . PNA vac Low Risk Adult (1 of 2 -  PCV13) 06/29/2025 (Originally 09/21/1998)  . DTaP/Tdap/Td (2 - Td) 05/25/2027  . TETANUS/TDAP  05/25/2027   Cancer Screenings: Lung: Low Dose CT Chest recommended if Age 4-80 years, 30 pack-year currently smoking OR have quit w/in 15years. Patient does not qualify. Colorectal: not required  Additional Screenings:  Hepatitis C Screening:n/a      Plan:    Patient just wants to live every day that he can  I have personally reviewed and noted the following in the patient's chart:   . Medical and social history . Use of alcohol, tobacco or illicit drugs  . Current medications and supplements . Functional ability and status . Nutritional status . Physical activity . Advanced directives . List of other physicians . Hospitalizations, surgeries, and ER visits in previous 12 months . Vitals . Screenings to include cognitive, depression, and falls . Referrals and appointments  In addition, I have reviewed and discussed with patient certain  preventive protocols, quality metrics, and best practice recommendations. A written personalized care plan for preventive services as well as general preventive health recommendations were provided to patient.     Kellie Simmering, LPN  1/61/0960

## 2018-11-09 ENCOUNTER — Other Ambulatory Visit: Payer: Self-pay | Admitting: Pulmonary Disease

## 2018-11-11 ENCOUNTER — Ambulatory Visit (INDEPENDENT_AMBULATORY_CARE_PROVIDER_SITE_OTHER): Payer: Medicare HMO | Admitting: Family Medicine

## 2018-11-11 ENCOUNTER — Encounter: Payer: Self-pay | Admitting: Family Medicine

## 2018-11-11 ENCOUNTER — Other Ambulatory Visit: Payer: Self-pay

## 2018-11-11 VITALS — BP 112/68 | HR 72 | Temp 98.3°F | Ht 65.0 in | Wt 139.5 lb

## 2018-11-11 DIAGNOSIS — I272 Pulmonary hypertension, unspecified: Secondary | ICD-10-CM

## 2018-11-11 DIAGNOSIS — F419 Anxiety disorder, unspecified: Secondary | ICD-10-CM | POA: Diagnosis not present

## 2018-11-11 DIAGNOSIS — I5022 Chronic systolic (congestive) heart failure: Secondary | ICD-10-CM | POA: Diagnosis not present

## 2018-11-11 DIAGNOSIS — R69 Illness, unspecified: Secondary | ICD-10-CM | POA: Diagnosis not present

## 2018-11-11 DIAGNOSIS — Z0000 Encounter for general adult medical examination without abnormal findings: Secondary | ICD-10-CM

## 2018-11-11 DIAGNOSIS — I4892 Unspecified atrial flutter: Secondary | ICD-10-CM

## 2018-11-11 DIAGNOSIS — E538 Deficiency of other specified B group vitamins: Secondary | ICD-10-CM

## 2018-11-11 DIAGNOSIS — J988 Other specified respiratory disorders: Secondary | ICD-10-CM

## 2018-11-11 DIAGNOSIS — Z95810 Presence of automatic (implantable) cardiac defibrillator: Secondary | ICD-10-CM

## 2018-11-11 DIAGNOSIS — D696 Thrombocytopenia, unspecified: Secondary | ICD-10-CM

## 2018-11-11 DIAGNOSIS — J984 Other disorders of lung: Secondary | ICD-10-CM

## 2018-11-11 DIAGNOSIS — I429 Cardiomyopathy, unspecified: Secondary | ICD-10-CM

## 2018-11-11 MED ORDER — B-12 1000 MCG SL SUBL: 1.0000 | SUBLINGUAL_TABLET | Freq: Every day | SUBLINGUAL | Status: DC

## 2018-11-11 MED ORDER — CYANOCOBALAMIN 1000 MCG/ML IJ SOLN
1000.0000 ug | Freq: Once | INTRAMUSCULAR | Status: AC
  Administered 2018-11-11: 1000 ug via INTRAMUSCULAR

## 2018-11-11 NOTE — Progress Notes (Signed)
This visit was conducted in person.  BP 112/68 (BP Location: Left Arm, Patient Position: Sitting, Cuff Size: Normal)   Pulse 72   Temp 98.3 F (36.8 C) (Temporal)   Ht 5\' 5"  (1.651 m)   Wt 139 lb 8 oz (63.3 kg)   SpO2 98%   BMI 23.21 kg/m    CC: CPE Subjective:    Patient ID: [name], male    DOB: 1932-04-03, 85 y.o.   MRN: [id number]  HPI: [name] is a 85 y.o. male presenting on 11/11/2018 for Annual Exam (Pt 2. )   Saw health advisor last week for medicare wellness visit. Note reviewed. MiniCog 16/22. Denies memory concerns.  Feels well on low dose prednisone.   Preventative: Colonoscopy - 2010 Tiffany Kocher) aborted early 2/2 significant diverticulosis. Aged out Prostate cancer - aged out Flu shot - declines Pneumococcal vaccine - declines Td 2011. Tdap 05/2017 zostavax 2008  shingrix - discussed Advanced directive: Wouldn't want prolonged life support. Wife would be medical decision maker. Has defibrillator in place. On [place] registry card - file # [id number]. Seat belt use discussed.  Sunscreen use discussed. No changing moles on skin.Sees dermatology Non smoker  Alcohol - none  Dentist doesn't see - has dentures  Eye exam every few years  Bowel - no constipation  Bladder - has seen urologist, on flomax   Lives with wife 3 children-8 grandchildren Still works on a farm. Retired from [place] friend with Dr. Epifanio Lesches of [facility] Activity: No regular exercise  Diet: good water, fruits/vegetables daily      Relevant past medical, surgical, family and social history reviewed and updated as indicated. Interim medical history since our last visit reviewed. Allergies and medications reviewed and updated. Outpatient Medications Prior to Visit  Medication Sig Dispense Refill  . acetaminophen (TYLENOL) 500 MG tablet Take 250 mg by mouth daily as needed. Pain    . ALPRAZolam (XANAX) 0.25 MG tablet  TAKE 1 TABLET (0.25 MG TOTAL) BY MOUTH AT BEDTIME.* INSURANCE ONLY COVERS 30 DAYS 30 tablet 0  . alum & mag hydroxide-simeth (MAALOX/MYLANTA) 200-200-20 MG/5ML suspension Take 15 mLs by mouth daily as needed for indigestion or heartburn.    . budesonide-formoterol (SYMBICORT) 160-4.5 MCG/ACT inhaler Inhale 2 puffs into the lungs at bedtime.    . carvedilol (COREG) 6.25 MG tablet Take 1 and 1/2 tablets by mouth twice daily 270 tablet 1  . fluticasone (FLONASE) 50 MCG/ACT nasal spray Place 1 spray into both nostrils daily.    . furosemide (LASIX) 40 MG tablet TAKE 1 TABLET BY MOUTH EVERY DAY 90 tablet 3  . guaiFENesin 200 MG tablet Take 400 mg by mouth every 4 (four) hours as needed for cough or to loosen phlegm.    . levalbuterol (XOPENEX HFA) 45 MCG/ACT inhaler Inhale 1-2 puffs into the lungs every 6 (six) hours as needed for wheezing or shortness of breath. 1 Inhaler 1  . Loratadine 10 MG CAPS Take 1 capsule by mouth daily as needed (allergies).     . montelukast (SINGULAIR) 10 MG tablet Take 10 mg by mouth daily as needed (BREATHING).     . polyethylene glycol (MIRALAX / GLYCOLAX) packet Take 17 g by mouth daily as needed (constipation).     . predniSONE (DELTASONE) 5 MG tablet TAKE 1 TABLET BY MOUTH EVERY DAY WITH BREAKFAST 30 tablet 0  . promethazine (PHENERGAN) 25 MG suppository Place 1 suppository (25 mg total) rectally every 6 (six) hours  as needed for nausea or vomiting. 12 each 3  . tamsulosin (FLOMAX) 0.4 MG CAPS capsule Take 1 capsule (0.4 mg total) by mouth daily. 30 capsule 11  . warfarin (COUMADIN) 5 MG tablet Take 1/2 to 1 tablet by mouth daily as directed by coumadin clinic 135 tablet 0  . vitamin B-12 (CYANOCOBALAMIN) 500 MCG tablet Take 500 mg by mouth daily.     No facility-administered medications prior to visit.      Per HPI unless specifically indicated in ROS section below Review of Systems  Constitutional: Positive for appetite change (decreased). Negative for activity  change, chills, fatigue, fever and unexpected weight change.  HENT: Negative for hearing loss.   Eyes: Negative for visual disturbance.  Respiratory: Negative for cough, chest tightness, shortness of breath and wheezing.   Cardiovascular: Negative for chest pain, palpitations and leg swelling.  Gastrointestinal: Negative for abdominal distention, abdominal pain, blood in stool, constipation, diarrhea, nausea and vomiting.  Genitourinary: Negative for difficulty urinating and hematuria.  Musculoskeletal: Negative for arthralgias, myalgias and neck pain.  Skin: Negative for rash.  Neurological: Negative for dizziness, seizures, syncope and headaches.  Hematological: Negative for adenopathy. Bruises/bleeds easily.  Psychiatric/Behavioral: Negative for dysphoric mood. The patient is not nervous/anxious.    Objective:    BP 112/68 (BP Location: Left Arm, Patient Position: Sitting, Cuff Size: Normal)   Pulse 72   Temp 98.3 F (36.8 C) (Temporal)   Ht 5\' 5"  (1.651 m)   Wt 139 lb 8 oz (63.3 kg)   SpO2 98%   BMI 23.21 kg/m   Wt Readings from Last 3 Encounters:  11/11/18 139 lb 8 oz (63.3 kg)  11/07/18 135 lb (61.2 kg)  11/03/18 136 lb (61.7 kg)    Physical Exam Vitals signs and nursing note reviewed.  Constitutional:      General: He is not in acute distress.    Appearance: Normal appearance. He is well-developed. He is not ill-appearing.  HENT:     Head: Normocephalic and atraumatic.     Right Ear: Hearing, tympanic membrane, ear canal and external ear normal.     Left Ear: Hearing, tympanic membrane, ear canal and external ear normal.     Nose: Nose normal.     Mouth/Throat:     Mouth: Mucous membranes are moist.     Pharynx: Uvula midline. No oropharyngeal exudate or posterior oropharyngeal erythema.  Eyes:     General: No scleral icterus.    Extraocular Movements: Extraocular movements intact.     Conjunctiva/sclera: Conjunctivae normal.     Pupils: Pupils are equal, round,  and reactive to light.  Neck:     Musculoskeletal: Normal range of motion and neck supple.     Vascular: No carotid bruit (not appreciated).  Cardiovascular:     Rate and Rhythm: Normal rate and regular rhythm.     Pulses: Normal pulses.          Radial pulses are 2+ on the right side and 2+ on the left side.     Heart sounds: Murmur present. Gallop: 3/6 systolic mechanical.   Pulmonary:     Effort: Pulmonary effort is normal. No respiratory distress.     Breath sounds: Normal breath sounds. No wheezing, rhonchi or rales.  Abdominal:     General: Abdomen is flat. Bowel sounds are normal. There is no distension.     Palpations: Abdomen is soft. There is no mass.     Tenderness: There is no abdominal tenderness. There is  no guarding or rebound.     Hernia: No hernia is present.  Musculoskeletal: Normal range of motion.  Lymphadenopathy:     Cervical: No cervical adenopathy.  Skin:    General: Skin is warm and dry.     Findings: No rash.  Neurological:     General: No focal deficit present.     Mental Status: He is alert and oriented to person, place, and time.     Comments: CN grossly intact, station and gait intact  Psychiatric:        Mood and Affect: Mood normal.        Behavior: Behavior normal.        Thought Content: Thought content normal.        Judgment: Judgment normal.       Results for orders placed or performed in visit on 11/06/18  Vitamin B12  Result Value Ref Range   Vitamin B-12 176 (L) 211 - 911 pg/mL  Hemoglobin A1c  Result Value Ref Range   Hgb A1c MFr Bld 6.2 4.6 - 6.5 %  CBC with Differential/Platelet  Result Value Ref Range   WBC 6.3 4.0 - 10.5 K/uL   RBC 4.38 4.22 - 5.81 Mil/uL   Hemoglobin 13.1 13.0 - 17.0 g/dL   HCT 39.0 39.0 - 52.0 %   MCV 89.2 78.0 - 100.0 fl   MCHC 33.7 30.0 - 36.0 g/dL   RDW 16.0 (H) 11.5 - 15.5 %   Platelets 116.0 (L) 150.0 - 400.0 K/uL   Neutrophils Relative % 75.7 43.0 - 77.0 %   Lymphocytes Relative 15.9 12.0 - 46.0  %   Monocytes Relative 6.4 3.0 - 12.0 %   Eosinophils Relative 1.1 0.0 - 5.0 %   Basophils Relative 0.9 0.0 - 3.0 %   Neutro Abs 4.8 1.4 - 7.7 K/uL   Lymphs Abs 1.0 0.7 - 4.0 K/uL   Monocytes Absolute 0.4 0.1 - 1.0 K/uL   Eosinophils Absolute 0.1 0.0 - 0.7 K/uL   Basophils Absolute 0.1 0.0 - 0.1 K/uL  TSH  Result Value Ref Range   TSH 1.96 0.35 - 4.50 uIU/mL  Comprehensive metabolic panel  Result Value Ref Range   Sodium 140 135 - 145 mEq/L   Potassium 4.4 3.5 - 5.1 mEq/L   Chloride 101 96 - 112 mEq/L   CO2 32 19 - 32 mEq/L   Glucose, Bld 102 (H) 70 - 99 mg/dL   BUN 20 6 - 23 mg/dL   Creatinine, Ser 1.25 0.40 - 1.50 mg/dL   Total Bilirubin 1.0 0.2 - 1.2 mg/dL   Alkaline Phosphatase 62 39 - 117 U/L   AST 19 0 - 37 U/L   ALT 12 0 - 53 U/L   Total Protein 6.5 6.0 - 8.3 g/dL   Albumin 4.2 3.5 - 5.2 g/dL   Calcium 9.3 8.4 - 10.5 mg/dL   GFR 54.88 (L) >60.00 mL/min  Lipid panel  Result Value Ref Range   Cholesterol 183 0 - 200 mg/dL   Triglycerides 130.0 0.0 - 149.0 mg/dL   HDL 49.50 >39.00 mg/dL   VLDL 26.0 0.0 - 40.0 mg/dL   LDL Cholesterol 107 (H) 0 - 99 mg/dL   Total CHOL/HDL Ratio 4    NonHDL 133.42    Assessment & Plan:   Problem List Items Addressed This Visit    Vitamin B12 deficiency    Levels low recently. Denies low energy or paresthesias. B12 shot today then start 1000 mcg daily (higher  dose)      Thrombocytopenia (HCC)    Chronic, stable. Continue to monitor with routine labs Notes easy bruising/bleeding in setting of coumadin use.        Secondary cardiomyopathy ([place])   Pulmonary HTN ([place])   Health maintenance examination - Primary    Preventative protocols reviewed and updated unless pt declined. Discussed healthy diet and lifestyle.       Congestion of respiratory tract   Chronic systolic heart failure (HCC)   Chronic lung disease    Continues low dose prednisone with significant benefit, aware of risks of long term prednisone use - benefits  outweigh risks at this time.       Automatic implantable cardioverter-defibrillator in situ   Atrial flutter (HCC)    Rate controlled on coumadin.       Anxiety    Managed with rare PRN xanax.           Meds ordered this encounter  Medications  . Cyanocobalamin (B-12) 1000 MCG SUBL    Sig: Place 1 tablet under the tongue daily.  . cyanocobalamin ((VITAMIN B-12)) injection 1,000 mcg   No orders of the defined types were placed in this encounter.   Follow up plan: Return in about 1 year (around 11/11/2019) for medicare wellness visit, annual exam, prior fasting for blood work.  Ria Bush, MD

## 2018-11-11 NOTE — Assessment & Plan Note (Addendum)
Chronic, stable. Continue to monitor with routine labs Notes easy bruising/bleeding in setting of coumadin use.

## 2018-11-11 NOTE — Assessment & Plan Note (Signed)
Managed with rare PRN xanax.

## 2018-11-11 NOTE — Assessment & Plan Note (Signed)
Preventative protocols reviewed and updated unless pt declined. Discussed healthy diet and lifestyle.  

## 2018-11-11 NOTE — Assessment & Plan Note (Signed)
Continues low dose prednisone with significant benefit, aware of risks of long term prednisone use - benefits outweigh risks at this time.

## 2018-11-11 NOTE — Assessment & Plan Note (Signed)
Levels low recently. Denies low energy or paresthesias. B12 shot today then start 1000 mcg daily (higher dose)

## 2018-11-11 NOTE — Assessment & Plan Note (Signed)
Rate controlled on coumadin 

## 2018-11-11 NOTE — Patient Instructions (Addendum)
B12 shot today then increase B12 to 1022mcg daily.  You are doing well today.  Return as needed or in 1 year for next wellness visit  Health Maintenance After Age 83 After age 70, you are at a higher risk for certain long-term diseases and infections as well as injuries from falls. Falls are a major cause of broken bones and head injuries in people who are older than age 70. Getting regular preventive care can help to keep you healthy and well. Preventive care includes getting regular testing and making lifestyle changes as recommended by your health care provider. Talk with your health care provider about:  Which screenings and tests you should have. A screening is a test that checks for a disease when you have no symptoms.  A diet and exercise plan that is right for you. What should I know about screenings and tests to prevent falls? Screening and testing are the best ways to find a health problem early. Early diagnosis and treatment give you the best chance of managing medical conditions that are common after age 79. Certain conditions and lifestyle choices may make you more likely to have a fall. Your health care provider may recommend:  Regular vision checks. Poor vision and conditions such as cataracts can make you more likely to have a fall. If you wear glasses, make sure to get your prescription updated if your vision changes.  Medicine review. Work with your health care provider to regularly review all of the medicines you are taking, including over-the-counter medicines. Ask your health care provider about any side effects that may make you more likely to have a fall. Tell your health care provider if any medicines that you take make you feel dizzy or sleepy.  Osteoporosis screening. Osteoporosis is a condition that causes the bones to get weaker. This can make the bones weak and cause them to break more easily.  Blood pressure screening. Blood pressure changes and medicines to control  blood pressure can make you feel dizzy.  Strength and balance checks. Your health care provider may recommend certain tests to check your strength and balance while standing, walking, or changing positions.  Foot health exam. Foot pain and numbness, as well as not wearing proper footwear, can make you more likely to have a fall.  Depression screening. You may be more likely to have a fall if you have a fear of falling, feel emotionally low, or feel unable to do activities that you used to do.  Alcohol use screening. Using too much alcohol can affect your balance and may make you more likely to have a fall. What actions can I take to lower my risk of falls? General instructions  Talk with your health care provider about your risks for falling. Tell your health care provider if: ? You fall. Be sure to tell your health care provider about all falls, even ones that seem minor. ? You feel dizzy, sleepy, or off-balance.  Take over-the-counter and prescription medicines only as told by your health care provider. These include any supplements.  Eat a healthy diet and maintain a healthy weight. A healthy diet includes low-fat dairy products, low-fat (lean) meats, and fiber from whole grains, beans, and lots of fruits and vegetables. Home safety  Remove any tripping hazards, such as rugs, cords, and clutter.  Install safety equipment such as grab bars in bathrooms and safety rails on stairs.  Keep rooms and walkways well-lit. Activity   Follow a regular exercise program to stay  fit. This will help you maintain your balance. Ask your health care provider what types of exercise are appropriate for you.  If you need a cane or walker, use it as recommended by your health care provider.  Wear supportive shoes that have nonskid soles. Lifestyle  Do not drink alcohol if your health care provider tells you not to drink.  If you drink alcohol, limit how much you have: ? 0-1 drink a day for  women. ? 0-2 drinks a day for men.  Be aware of how much alcohol is in your drink. In the [place] one drink equals one typical bottle of beer (12 oz), one-half glass of wine (5 oz), or one shot of hard liquor (1 oz).  Do not use any products that contain nicotine or tobacco, such as cigarettes and e-cigarettes. If you need help quitting, ask your health care provider. Summary  Having a healthy lifestyle and getting preventive care can help to protect your health and wellness after age 26.  Screening and testing are the best way to find a health problem early and help you avoid having a fall. Early diagnosis and treatment give you the best chance for managing medical conditions that are more common for people who are older than age 24.  Falls are a major cause of broken bones and head injuries in people who are older than age 60. Take precautions to prevent a fall at home.  Work with your health care provider to learn what changes you can make to improve your health and wellness and to prevent falls. This information is not intended to replace advice given to you by your health care provider. Make sure you discuss any questions you have with your health care provider. Document Released: 02/06/2017 Document Revised: 07/17/2018 Document Reviewed: 02/06/2017 Elsevier Patient Education  2020 [employer].

## 2018-11-24 DIAGNOSIS — D2261 Melanocytic nevi of right upper limb, including shoulder: Secondary | ICD-10-CM | POA: Diagnosis not present

## 2018-11-24 DIAGNOSIS — D2262 Melanocytic nevi of left upper limb, including shoulder: Secondary | ICD-10-CM | POA: Diagnosis not present

## 2018-11-24 DIAGNOSIS — Z85828 Personal history of other malignant neoplasm of skin: Secondary | ICD-10-CM | POA: Diagnosis not present

## 2018-11-24 DIAGNOSIS — D485 Neoplasm of uncertain behavior of skin: Secondary | ICD-10-CM | POA: Diagnosis not present

## 2018-11-24 DIAGNOSIS — L986 Other infiltrative disorders of the skin and subcutaneous tissue: Secondary | ICD-10-CM | POA: Diagnosis not present

## 2018-11-24 DIAGNOSIS — C4442 Squamous cell carcinoma of skin of scalp and neck: Secondary | ICD-10-CM | POA: Diagnosis not present

## 2018-11-24 DIAGNOSIS — X32XXXA Exposure to sunlight, initial encounter: Secondary | ICD-10-CM | POA: Diagnosis not present

## 2018-11-24 DIAGNOSIS — L821 Other seborrheic keratosis: Secondary | ICD-10-CM | POA: Diagnosis not present

## 2018-11-24 DIAGNOSIS — Z08 Encounter for follow-up examination after completed treatment for malignant neoplasm: Secondary | ICD-10-CM | POA: Diagnosis not present

## 2018-11-24 DIAGNOSIS — D225 Melanocytic nevi of trunk: Secondary | ICD-10-CM | POA: Diagnosis not present

## 2018-11-24 DIAGNOSIS — L570 Actinic keratosis: Secondary | ICD-10-CM | POA: Diagnosis not present

## 2018-12-01 ENCOUNTER — Ambulatory Visit (INDEPENDENT_AMBULATORY_CARE_PROVIDER_SITE_OTHER): Payer: Medicare HMO

## 2018-12-01 ENCOUNTER — Other Ambulatory Visit: Payer: Self-pay

## 2018-12-01 DIAGNOSIS — I4892 Unspecified atrial flutter: Secondary | ICD-10-CM | POA: Diagnosis not present

## 2018-12-01 DIAGNOSIS — Z5181 Encounter for therapeutic drug level monitoring: Secondary | ICD-10-CM | POA: Diagnosis not present

## 2018-12-01 DIAGNOSIS — Z7901 Long term (current) use of anticoagulants: Secondary | ICD-10-CM | POA: Diagnosis not present

## 2018-12-01 LAB — POCT INR: INR: 2 (ref 2.0–3.0)

## 2018-12-01 NOTE — Patient Instructions (Signed)
Please continue dosage of 1 pill (5mg ) daily EXCEPT for 1/2 pill on Mondays & Fridays. Recheck in 6 weeks.

## 2018-12-03 ENCOUNTER — Other Ambulatory Visit: Payer: Self-pay | Admitting: Pulmonary Disease

## 2018-12-11 ENCOUNTER — Telehealth: Payer: Self-pay | Admitting: Pulmonary Disease

## 2018-12-11 MED ORDER — PREDNISONE 5 MG PO TABS: ORAL_TABLET | ORAL | 0 refills | Status: DC

## 2018-12-11 NOTE — Telephone Encounter (Signed)
Spoke with Octavia Bruckner and advised him that he would have to make an appt for pt if he wanted a refill. On the prednisone. He agreed to make an appt with AO on 12/23/2018 at 11:15 for a video visit. Pt refused to come into the office. Rx sent in and nothing further is needed.

## 2018-12-12 ENCOUNTER — Other Ambulatory Visit: Payer: Self-pay | Admitting: Cardiology

## 2018-12-19 ENCOUNTER — Telehealth: Payer: Self-pay | Admitting: Cardiology

## 2018-12-19 NOTE — Telephone Encounter (Signed)
Spoke with and advised preferred method is for Dr [name] to phone spouse when he goes in the room however if patient insisted it was ok.

## 2018-12-19 NOTE — Telephone Encounter (Signed)
Patient wants wife to accompany him to his appointment on 12/22/18

## 2018-12-21 NOTE — Progress Notes (Addendum)
HPI [name] presents for follow up of his cardiomyopathy and atrial flutter.  EF on the last echo that I ordered in Jan was 20 - 25%.   Since I last saw him he saw Dr. Lovena Le and had his ICD reprogrammed to allow AV synchrony as he had converted to NSR.    Since I last saw him he has done well.  He planted his tomatoes and has done well this summer.  The patient denies any new symptoms such as chest discomfort, neck or arm discomfort. There has been no new shortness of breath, PND or orthopnea. There have been no reported palpitations, presyncope or syncope.   Allergies  Allergen Reactions  . Clarithromycin Nausea Only  . Famotidine Other (See Comments)    ABD. PAIN  . Levaquin [Levofloxacin] Other (See Comments)    Stomach pain, has to eat a lot of food  . Omeprazole Diarrhea  . Oxytetracycline Other (See Comments)    BUMPS  . Penicillins Swelling      . Proton Pump Inhibitors Other (See Comments)    GI upset, diarrhea, gas, bloating  . Ranitidine Diarrhea  . Doxycycline Rash    Current Outpatient Medications  Medication Sig Dispense Refill  . acetaminophen (TYLENOL) 500 MG tablet Take 250 mg by mouth daily as needed. Pain    . ALPRAZolam (XANAX) 0.25 MG tablet TAKE 1 TABLET (0.25 MG TOTAL) BY MOUTH AT BEDTIME.* INSURANCE ONLY COVERS 30 DAYS 30 tablet 0  . alum & mag hydroxide-simeth (MAALOX/MYLANTA) 200-200-20 MG/5ML suspension Take 15 mLs by mouth daily as needed for indigestion or heartburn.    . budesonide-formoterol (SYMBICORT) 160-4.5 MCG/ACT inhaler Inhale 2 puffs into the lungs at bedtime.    . carvedilol (COREG) 6.25 MG tablet TAKE 1 AND 1/2 TABLETS BY MOUTH TWICE DAILY 270 tablet 0  . Cyanocobalamin (B-12) 1000 MCG SUBL Place 1 tablet under the tongue daily.    . fluticasone (FLONASE) 50 MCG/ACT nasal spray Place 1 spray into both nostrils daily.    . furosemide (LASIX) 40 MG tablet TAKE 1 TABLET BY MOUTH EVERY DAY 90 tablet 3  . guaiFENesin 200 MG  tablet Take 400 mg by mouth every 4 (four) hours as needed for cough or to loosen phlegm.    . levalbuterol (XOPENEX HFA) 45 MCG/ACT inhaler Inhale 1-2 puffs into the lungs every 6 (six) hours as needed for wheezing or shortness of breath. 1 Inhaler 1  . Loratadine 10 MG CAPS Take 1 capsule by mouth daily as needed (allergies).     . montelukast (SINGULAIR) 10 MG tablet Take 10 mg by mouth daily as needed (BREATHING).     . polyethylene glycol (MIRALAX / GLYCOLAX) packet Take 17 g by mouth daily as needed (constipation).     . predniSONE (DELTASONE) 5 MG tablet TAKE 1 TABLET BY MOUTH EVERY DAY WITH BREAKFAST 30 tablet 0  . promethazine (PHENERGAN) 25 MG suppository Place 1 suppository (25 mg total) rectally every 6 (six) hours as needed for nausea or vomiting. 12 each 3  . tamsulosin (FLOMAX) 0.4 MG CAPS capsule Take 1 capsule (0.4 mg total) by mouth daily. 30 capsule 11  . warfarin (COUMADIN) 5 MG tablet Take 1/2 to 1 tablet by mouth daily as directed by coumadin clinic 135 tablet 0   No current facility-administered medications for this visit.     Past Medical History:  Diagnosis Date  . AICD (automatic cardioverter/defibrillator) present    s/p gen change  04/2015 w/ MDT [name] Crt-D  [id number], Serial number [id number]  . Allergic rhinitis    [name]  . Anemia   . Anxiety   . Arthritis   . Atrial flutter ([place])    A.  Status post cardioversion; B.  Tikosyn therapy - failed, remains in aflutter  . Bilateral pneumonia 11/02/2013   treated with levaquin  . BPH (benign prostatic hyperplasia)   . 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 Lucky Cowboy)  . Chronic lung disease    spirometry 2015 - no obstruction, + mild restrictive lung disease  . Chronic sinusitis   . Chronic systolic congestive heart failure ([place])   . Extrinsic asthma    [name]  . Fall with injury 05/28/2017  . GERD (gastroesophageal reflux disease) 2010   h/o esophageal stricture with dilation, LA grade C  reflux esophagitis by EGD 2010  . Goiter   . History of diverticulitis of colon   . History of GI bleed    Secondary to hemorrhoids  . IBS (irritable bowel syndrome)   . LBBB (left bundle branch block)   . Multiple pulmonary nodules 06/2013   RUL ([facility]. Adam Phenix [facility]) - ?vasculitis as of last CT at [facility]  . NICM (nonischemic cardiomyopathy) [facility])    Cardiac catheterization March 2006 without coronary disease; EF 25% 2018 Jan  . Porphyria [facility])   . Sinus congestion 09/25/2010    Past Surgical History:  Procedure Laterality Date  . BACK SURGERY  1997   bulging disks  . BiV-AICD implant     [employer]  . CARDIAC CATHETERIZATION  06/22/04   Severe, nonischemic cardiomyopathy,EF 25-30%  . CARDIOVERSION  02/22/09   AFlutter-([facility])  . CHOLECYSTECTOMY    . COLONOSCOPY  10/99   Divertic, splenic, hepatic fleure only  . COLONOSCOPY  10/21/08   aborted-divertics, int hemms (Dr. Vira Agar)  . CORONARY ANGIOPLASTY    . EP IMPLANTABLE DEVICE N/A 04/25/2015   Procedure: BIV ICD Generator Changeout;  Surgeon: Evans Lance, MD;  Location: [place] [facility];  Service: Cardiovascular;  Laterality: N/A;  . ESOPHAGEAL DILATION  02/18/98  . ESOPHAGOGASTRODUODENOSCOPY  01/1998   stricture, sliding HH, GERD  . ESOPHAGOGASTRODUODENOSCOPY  04/08/01   stricture, gastritis, HH, GERD-no dilation(Dr. Henrene Pastor)  . ESOPHAGOGASTRODUODENOSCOPY  12/22/04   stricture; gastritis; duodenitis, GERD  . ESOPHAGOGASTRODUODENOSCOPY  10/21/08   Reflux esophagitis; Erythem. Duod. (Dr. Vira Agar)  . ESOPHAGOGASTRODUODENOSCOPY  08/2013   erythema gastric fundus - minimal gastritis, HH ([place])  . ESOPHAGOGASTRODUODENOSCOPY  08/2016   dysphagia - mod schatzki rin, dilated Tiffany Kocher)  . ESOPHAGOGASTRODUODENOSCOPY (EGD) WITH PROPOFOL N/A 08/24/2016   mod schatzki ring dilated, duodenal deformity Vira Agar, Gavin Pound, MD)  . goiter removal    . HERNIA REPAIR    . HERNIA REPAIR  01/30/02   Dr. Hassell Done  . INSERT / REPLACE /  REMOVE PACEMAKER    . NOSE SURGERY  1971  . PENILE PROSTHESIS IMPLANT  2007   [place]  . PFT  10/2013   FVC 61%, FEV1 63%, ratio 0.76  . Pulmonary eval  04/2002   ([facility]) Chronic congestive symptoms  . REVERSE SHOULDER ARTHROPLASTY Right 01/17/2016   Procedure: REVERSE SHOULDER ARTHROPLASTY;  Surgeon: Corky Mull, MD;  Location: [facility] ORS;  Service: Orthopedics;  Laterality: Right;  . REVERSE TOTAL SHOULDER ARTHROPLASTY Right 01/2016   Poggi  . US ECHOCARDIOGRAPHY  07/13/04   EF 25-30%, Mod LVH; LA severe dilation; Mild AR; IRTR  .  US ECHOCARDIOGRAPHY  11/27/06   hypokinesis posterior wall, EF 35%; mild AR    ROS:  As stated in the HPI and negative for all other systems.   PHYSICAL EXAM BP (!) 108/58   Pulse 78   Temp (!) 97.1 F (36.2 C)   Ht 5\' 9"  (1.753 m)   Wt 140 lb 6.4 oz (63.7 kg)   SpO2 98%   BMI 20.73 kg/m   GENERAL:  Well appearing NECK:  No jugular venous distention, waveform within normal limits, carotid upstroke brisk and symmetric, no bruits, no thyromegaly LUNGS:  Clear to auscultation bilaterally CHEST:  Well healed ICD pocket.   HEART:  PMI not displaced or sustained,S1 and S2 within normal limits, no S3, no S4, no clicks, no rubs, 2 out of 6 apical diastolic murmur refill blood pressure cuff murmurs ABD:  Flat, positive bowel sounds normal in frequency in pitch, no bruits, no rebound, no guarding, no midline pulsatile mass, no hepatomegaly, no splenomegaly EXT:  2 plus pulses throughout, no edema, no cyanosis no clubbing  EKG:     NA  ASSESSMENT AND PLAN   Nonischemic cardiomyopathy -  He is doing very well.  No change in therapy.   Atrial flutter -   Mr. Leonard B Luchsinger has a CHA2DS2 - VASc score of 3.  He tolerates his anticoagulation.  No change in therapy.   Pulmonary HTN - He does well as long as he takes his dose of steroids as above.  No change in therapy.

## 2018-12-22 ENCOUNTER — Other Ambulatory Visit: Payer: Self-pay

## 2018-12-22 ENCOUNTER — Encounter: Payer: Self-pay | Admitting: Cardiology

## 2018-12-22 ENCOUNTER — Ambulatory Visit: Payer: Medicare HMO | Admitting: Cardiology

## 2018-12-22 VITALS — BP 108/58 | HR 78 | Temp 97.1°F | Ht 69.0 in | Wt 140.4 lb

## 2018-12-22 DIAGNOSIS — I5022 Chronic systolic (congestive) heart failure: Secondary | ICD-10-CM | POA: Diagnosis not present

## 2018-12-22 DIAGNOSIS — I272 Pulmonary hypertension, unspecified: Secondary | ICD-10-CM

## 2018-12-22 DIAGNOSIS — I4892 Unspecified atrial flutter: Secondary | ICD-10-CM | POA: Diagnosis not present

## 2018-12-22 DIAGNOSIS — Z95810 Presence of automatic (implantable) cardiac defibrillator: Secondary | ICD-10-CM

## 2018-12-22 NOTE — Patient Instructions (Signed)
Medication Instructions:  Your physician recommends that you continue on your current medications as directed. Please refer to the Current Medication list given to you today.  If you need a refill on your cardiac medications before your next appointment, please call your pharmacy.   Lab work: NONE  Testing/Procedures: NONE  Follow-Up: At [facility], you and your health needs are our priority.  As part of our continuing mission to provide you with exceptional heart care, we have created designated Provider Care Teams.  These Care Teams include your primary Cardiologist (physician) and Advanced Practice Providers (APPs -  Physician Assistants and Nurse Practitioners) who all work together to provide you with the care you need, when you need it. You will need a follow up appointment in 6 months.  Please call our office 2 months in advance to schedule this appointment.  You may see James Hochrein, MD or one of the following Advanced Practice Providers on your designated Care Team:   Rhonda Barrett, PA-C Kathryn Lawrence, DNP, ANP       

## 2018-12-23 ENCOUNTER — Telehealth (INDEPENDENT_AMBULATORY_CARE_PROVIDER_SITE_OTHER): Payer: Medicare HMO | Admitting: Pulmonary Disease

## 2018-12-23 DIAGNOSIS — G4733 Obstructive sleep apnea (adult) (pediatric): Secondary | ICD-10-CM

## 2018-12-23 DIAGNOSIS — J9611 Chronic respiratory failure with hypoxia: Secondary | ICD-10-CM

## 2018-12-23 NOTE — Patient Instructions (Signed)
Chronic cough, controlled with low-dose prednisone  Continue using your prednisone as prescribed Call with significant concerns  We will see you again in about 3 months

## 2018-12-23 NOTE — Progress Notes (Signed)
Virtual Visit via Video Note  I connected with [name] on 12/23/18 at 11:15 AM EDT by a video enabled telemedicine application and verified that I am speaking with the correct person using two identifiers.  Location: Patient: [name] Provider: Adrian Prince discussed the limitations of evaluation and management by telemedicine and the availability of in person appointments. The patient expressed understanding and agreed to proceed.  History of Present Illness: Chronic cough-stable -Cough and clearing secretions in the morning -Denies feeling acutely ill  -No fevers, no chills History of obstructive lung disease-stable History of cardiomyopathy-stable Saw cardiology yesterday and he was told he was doing fine, to visit again in about 6 months History of systolic heart failure-stable, continues to follow-up with cardiology   Observations/Objective: Looks well on video Appropriate in conversation  Assessment and Plan: Chronic cough-stable, continue lines of care -Has been on prednisone 5 mg daily -Tolerating this well -Attempts at decreasing the dose were not well-tolerated  Cardiomyopathy Systolic heart failure -He will continue following up with cardiology   He will continue prednisone as prescribed   Follow Up Instructions:    I discussed the assessment and treatment plan with the patient. The patient was provided an opportunity to ask questions and all were answered. The patient agreed with the plan and demonstrated an understanding of the instructions.   The patient was advised to call back or seek an in-person evaluation if the symptoms worsen or if the condition fails to improve as anticipated.  I provided 15 minutes of non-face-to-face time during this encounter.   Laurin Coder, MD

## 2018-12-24 ENCOUNTER — Ambulatory Visit (INDEPENDENT_AMBULATORY_CARE_PROVIDER_SITE_OTHER): Payer: Medicare HMO | Admitting: *Deleted

## 2018-12-24 DIAGNOSIS — I428 Other cardiomyopathies: Secondary | ICD-10-CM

## 2018-12-24 DIAGNOSIS — I5022 Chronic systolic (congestive) heart failure: Secondary | ICD-10-CM

## 2018-12-24 LAB — CUP PACEART REMOTE DEVICE CHECK
Battery Remaining Longevity: 26 mo
Battery Voltage: 2.94 V
Brady Statistic AP VP Percent: 97.78 %
Brady Statistic AP VS Percent: 0.11 %
Brady Statistic AS VP Percent: 2.08 %
Brady Statistic AS VS Percent: 0.03 %
Brady Statistic RA Percent Paced: 94.83 %
Brady Statistic RV Percent Paced: 97.93 %
Date Time Interrogation Session: 20200916083823
HighPow Impedance: 38 Ohm
HighPow Impedance: 48 Ohm
Implantable Lead Implant Date: 20070525
Implantable Lead Implant Date: 20070525
Implantable Lead Implant Date: 20070525
Implantable Lead Location: 753858
Implantable Lead Location: 753859
Implantable Lead Location: 753860
Implantable Lead Model: 4194
Implantable Lead Model: 5076
Implantable Lead Model: 6949
Implantable Pulse Generator Implant Date: 20170116
Lead Channel Impedance Value: 171 Ohm
Lead Channel Impedance Value: 285 Ohm
Lead Channel Impedance Value: 418 Ohm
Lead Channel Impedance Value: 418 Ohm
Lead Channel Impedance Value: 456 Ohm
Lead Channel Impedance Value: 589 Ohm
Lead Channel Pacing Threshold Amplitude: 0.625 V
Lead Channel Pacing Threshold Amplitude: 0.875 V
Lead Channel Pacing Threshold Pulse Width: 0.4 ms
Lead Channel Pacing Threshold Pulse Width: 0.4 ms
Lead Channel Sensing Intrinsic Amplitude: 13.75 mV
Lead Channel Sensing Intrinsic Amplitude: 13.75 mV
Lead Channel Sensing Intrinsic Amplitude: 3.125 mV
Lead Channel Sensing Intrinsic Amplitude: 3.125 mV
Lead Channel Setting Pacing Amplitude: 2 V
Lead Channel Setting Pacing Amplitude: 2 V
Lead Channel Setting Pacing Amplitude: 2 V
Lead Channel Setting Pacing Pulse Width: 0.4 ms
Lead Channel Setting Pacing Pulse Width: 0.4 ms
Lead Channel Setting Sensing Sensitivity: 0.45 mV

## 2018-12-25 DIAGNOSIS — K219 Gastro-esophageal reflux disease without esophagitis: Secondary | ICD-10-CM | POA: Diagnosis not present

## 2018-12-25 DIAGNOSIS — H1045 Other chronic allergic conjunctivitis: Secondary | ICD-10-CM | POA: Diagnosis not present

## 2018-12-25 DIAGNOSIS — J4530 Mild persistent asthma, uncomplicated: Secondary | ICD-10-CM | POA: Diagnosis not present

## 2018-12-25 DIAGNOSIS — J300 Vasomotor rhinitis: Secondary | ICD-10-CM | POA: Diagnosis not present

## 2018-12-26 ENCOUNTER — Other Ambulatory Visit: Payer: Self-pay | Admitting: Cardiology

## 2018-12-26 MED ORDER — WARFARIN SODIUM 5 MG PO TABS: ORAL_TABLET | ORAL | 1 refills | Status: DC

## 2018-12-29 ENCOUNTER — Other Ambulatory Visit: Payer: Self-pay

## 2018-12-29 ENCOUNTER — Telehealth (INDEPENDENT_AMBULATORY_CARE_PROVIDER_SITE_OTHER): Payer: Medicare HMO | Admitting: Urology

## 2018-12-29 DIAGNOSIS — N401 Enlarged prostate with lower urinary tract symptoms: Secondary | ICD-10-CM | POA: Diagnosis not present

## 2018-12-29 NOTE — Progress Notes (Signed)
Virtual Visit via Telephone Note  I connected with [name] on 12/29/18 at  1:00 PM EDT by telephone and verified that I am speaking with the correct person using two identifiers.   I discussed the limitations, risks, security and privacy concerns of performing an evaluation and management service by telephone and the availability of in person appointments. We discussed the impact of the COVID-19 pandemic on the healthcare system, and the importance of social distancing and reducing patient and provider exposure. I also discussed with the patient that there may be a patient responsible charge related to this service. The patient expressed understanding and agreed to proceed.  Reason for visit: BPH  History of Present Illness: I had virtual phone visit follow-up with [name] today regarding his urinary symptoms.  To briefly summarize, he is an 85 year old comorbid male with cardiac history, CHF, 40 mg Lasix daily, and Coumadin who I saw in June 2020 for urinary symptoms of weak stream, frequency, and difficulty initiating his stream.  He was emptying well with a PVR of 28 mL in clinic, and urinalysis was benign.  We elected for a trial of Flomax daily.  We previously discussed that with his comorbidities he likely would not be a good candidate for an outlet procedure unless in urinary retention.  He reports he is doing extremely well on the Flomax and it has significantly improved his strength of urinary stream.  He denies any gross hematuria or other issues.  Denies any lightheadedness or dizziness since starting the Flomax.  Overall he is very happy with his quality of life regarding urinary symptoms now on the Flomax.  Follow Up: Continue Flomax RTC 1 year for PVR and symptom check  I discussed the assessment and treatment plan with the patient. The patient was provided an opportunity to ask questions and all were answered. The patient agreed with the plan and demonstrated an  understanding of the instructions.   The patient was advised to call back or seek an in-person evaluation if the symptoms worsen or if the condition fails to improve as anticipated.  I provided 15 minutes of non-face-to-face time during this encounter.   Billey Co, MD

## 2018-12-30 ENCOUNTER — Encounter: Payer: Self-pay | Admitting: Cardiology

## 2018-12-30 NOTE — Progress Notes (Signed)
Remote ICD transmission.   

## 2018-12-31 DIAGNOSIS — C4442 Squamous cell carcinoma of skin of scalp and neck: Secondary | ICD-10-CM | POA: Diagnosis not present

## 2019-01-03 ENCOUNTER — Other Ambulatory Visit: Payer: Self-pay | Admitting: Pulmonary Disease

## 2019-01-12 ENCOUNTER — Other Ambulatory Visit: Payer: Self-pay

## 2019-01-12 ENCOUNTER — Other Ambulatory Visit: Payer: Self-pay | Admitting: Family Medicine

## 2019-01-12 ENCOUNTER — Ambulatory Visit (INDEPENDENT_AMBULATORY_CARE_PROVIDER_SITE_OTHER): Payer: Medicare HMO

## 2019-01-12 DIAGNOSIS — Z5181 Encounter for therapeutic drug level monitoring: Secondary | ICD-10-CM | POA: Diagnosis not present

## 2019-01-12 DIAGNOSIS — I4892 Unspecified atrial flutter: Secondary | ICD-10-CM

## 2019-01-12 DIAGNOSIS — Z7901 Long term (current) use of anticoagulants: Secondary | ICD-10-CM | POA: Diagnosis not present

## 2019-01-12 LAB — POCT INR: INR: 2.1 (ref 2.0–3.0)

## 2019-01-12 NOTE — Patient Instructions (Signed)
Please continue dosage of 1 pill (5mg ) daily EXCEPT for 1/2 pill on Mondays & Fridays. Recheck in 6 weeks.

## 2019-01-12 NOTE — Telephone Encounter (Signed)
Name of Medication: Xanax Name of [facility]acy: [facility] [address] Last Fill or Written Date and Quantity: 10/14/18 #30 tabs with 0 refills Last Office Visit and Type: CPE on 11/11/18 Next Office Visit and Type: CPE on 11/16/19 Last Controlled Substance Agreement Date: n/a Last UDS:n/a

## 2019-01-14 NOTE — Telephone Encounter (Signed)
ERx 

## 2019-01-26 NOTE — Telephone Encounter (Signed)
Manual transmission reviewed due to reported SOB and cough. Normal CRT-D function. Presenting rhythm AF/BiVP @ 81bpm w/PVCs. Heart failure diagnostics stable, no significant impedance change to suggest fluid accumulation. Patient back in persistent AF since 01/05/19 (AF previously thought to be permanent per Dr. Tanna Furry OV note from 08/13/18). No VT/VF episodes. Bi-ventricularly pacing 97%. Routed to [facility] triage pool for further management.

## 2019-01-26 NOTE — Telephone Encounter (Signed)
Called and talked to patient's son Octavia Bruckner, who is on [place].  Patient is having increased shortness of breath, productive cough, and increased fatigue. Patient was unable to walk anywhere and that is unusual for him. Patient has been in chair all day and short of breath without activity. Scheduled patient for a MyChart Video Visit with Dr. [name].

## 2019-01-27 ENCOUNTER — Telehealth: Payer: Self-pay | Admitting: *Deleted

## 2019-01-27 ENCOUNTER — Telehealth (INDEPENDENT_AMBULATORY_CARE_PROVIDER_SITE_OTHER): Payer: Medicare HMO | Admitting: Pulmonary Disease

## 2019-01-27 DIAGNOSIS — R0602 Shortness of breath: Secondary | ICD-10-CM | POA: Diagnosis not present

## 2019-01-27 MED ORDER — PREDNISONE 10 MG PO TABS: 10.0000 mg | ORAL_TABLET | Freq: Every day | ORAL | 1 refills | Status: DC

## 2019-01-27 NOTE — Patient Instructions (Signed)
Increase prednisone to 10 mg daily for 7 days  Cut down to 5 mg after 7 days if feeling better  If soon after cutting down to 5 mg you still feel poorly you may go back up to 7.5  Call us in about 3 to 4 days if you are not feeling better on 10 mg daily-we may consider chest x-ray and blood work at that time  Call with significant concerns

## 2019-01-27 NOTE — Telephone Encounter (Signed)
   Dr [name] suggested he see PCP. Per son patient had virtual visit with pulmonologist today and will proceed with his recommendations for now.   RE: Non-Urgent Medical Question  From  [name] To  [name], MD Sent  01/26/2019 10:59 AM    ok.  thank you.   we are not asking for an appt unless the Dr thinks one is needed.     not sure virtual would do much good.  [name]   Previous Messages   RE: Non-Urgent Medical Question  From  [name], RN To  [name] Sent and Delivered  01/26/2019 10:35 AM  Last Read in MyChart  01/26/2019 10:58 AM by [name]  Thanks for this update. I will pass this message along to Dr. [name]. He only has virtual appointment openings this week (phone/video visits)   Previous Messages   RE: Non-Urgent Medical Question  From  [name] To  [name], MD Sent  01/26/2019 10:21 AM  Thank you for your quick reply Nurse [name].     Dad says he does not notice any swelling in his feet or anywhere else.   he says he has gained 3 lbs over last couple weeks.   his pulse 60 or so when sitting down and in upper 70s when he walks to the bathroom and sits back down.    oxygen is around 95 to 100  he checks pulse and oxygen very often.     he just checked his blood pressure and its 115 over 49  He is able to come in if Dr [name] wants to see him.    Thank you  [name] [phone]   Previous Messages   RE: Non-Urgent Medical Question  From  [name], RN To  [name] Sent and Delivered  01/26/2019 10:01 AM  Last Read in MyChart  01/26/2019 10:09 AM by [name]  Is he having any weight gain or swelling? Does he check his BP or pulse at home? If so, what are his most recent readings?   Previous Messages   Non-Urgent Medical Question  From  [name] To  [name], MD Sent  01/26/2019 9:50 AM  Hi Doctor  [name],   I am [name].   The son of your patient [name], [name].   I am reaching out to you to let you know dad has not been feeling well for going on the 3rd day in a row.  really out of breath and feels sick on stomach off and on from the stuff he coughs up.   he can't even walk to the mail box today.  just sitting in his chair all morning so far which is certainly not like him.  I already messaged his lung doctor this morning also [name]) because he is the one who prescribed the 5mg  daily of pretizone steriod which had been helping him but for some reason his energy seems to have disappeared quite fast so just wondering if something might be going on that needs to be checked.     if you have any ideas please share with [place].   Thank you  [name] [phone]

## 2019-01-27 NOTE — Progress Notes (Signed)
Virtual Visit via Video Note  I connected with [name] on 01/27/19 at  2:00 PM EDT by a video enabled telemedicine application and verified that I am speaking with the correct person using two identifiers.  Location: Patient: [name] Provider: Adrian Prince discussed the limitations of evaluation and management by telemedicine and the availability of in person appointments. The patient expressed understanding and agreed to proceed.  History of Present Illness: Increase shortness of breath, increased congestion Weakness  No fever, no chills More fatigued than usual  Bringing up clear phlegm  Prednisone has helped in the past  Currently using 5 mg daily  Denies any chest pains or chest discomfort   Observations/Objective: He does appear fair Does not appear to be in respiratory distress   Assessment and Plan: Bronchitis Pulmonary congestion  We will increase his prednisone to 10 mg daily for about 7 days  To cut down to 5 mg after 7 days  He is to give [place] a call in about 3 to 4 days if he is not feeling better, at which point we will get a chest x-ray and blood work  Otherwise goal is to cut down in 7 days to 5 mg daily, if he starts feeling poorly soon after cutting down to 5 he may go up to 7.5  He is to give [place] a call if he is having ongoing problems  Follow Up Instructions:    I discussed the assessment and treatment plan with the patient. The patient was provided an opportunity to ask questions and all were answered. The patient agreed with the plan and demonstrated an understanding of the instructions.   The patient was advised to call back or seek an in-person evaluation if the symptoms worsen or if the condition fails to improve as anticipated.  I provided 12 minutes of non-face-to-face time during this encounter.   Laurin Coder, MD

## 2019-02-16 DIAGNOSIS — L570 Actinic keratosis: Secondary | ICD-10-CM | POA: Diagnosis not present

## 2019-02-16 DIAGNOSIS — C44212 Basal cell carcinoma of skin of right ear and external auricular canal: Secondary | ICD-10-CM | POA: Diagnosis not present

## 2019-02-16 DIAGNOSIS — Z08 Encounter for follow-up examination after completed treatment for malignant neoplasm: Secondary | ICD-10-CM | POA: Diagnosis not present

## 2019-02-16 DIAGNOSIS — D485 Neoplasm of uncertain behavior of skin: Secondary | ICD-10-CM | POA: Diagnosis not present

## 2019-02-16 DIAGNOSIS — Z85828 Personal history of other malignant neoplasm of skin: Secondary | ICD-10-CM | POA: Diagnosis not present

## 2019-02-16 DIAGNOSIS — L986 Other infiltrative disorders of the skin and subcutaneous tissue: Secondary | ICD-10-CM | POA: Diagnosis not present

## 2019-02-23 ENCOUNTER — Ambulatory Visit (INDEPENDENT_AMBULATORY_CARE_PROVIDER_SITE_OTHER): Payer: Medicare HMO

## 2019-02-23 ENCOUNTER — Other Ambulatory Visit: Payer: Self-pay

## 2019-02-23 DIAGNOSIS — I4892 Unspecified atrial flutter: Secondary | ICD-10-CM

## 2019-02-23 DIAGNOSIS — Z5181 Encounter for therapeutic drug level monitoring: Secondary | ICD-10-CM

## 2019-02-23 DIAGNOSIS — Z7901 Long term (current) use of anticoagulants: Secondary | ICD-10-CM

## 2019-02-23 LAB — POCT INR: INR: 2.3 (ref 2.0–3.0)

## 2019-02-23 NOTE — Patient Instructions (Signed)
Please continue dosage of 1 pill (5mg ) daily EXCEPT for 1/2 pill on Mondays & Fridays. Recheck in 5 weeks.

## 2019-03-07 ENCOUNTER — Other Ambulatory Visit: Payer: Self-pay | Admitting: Cardiology

## 2019-03-24 ENCOUNTER — Other Ambulatory Visit: Payer: Self-pay | Admitting: Pulmonary Disease

## 2019-03-24 DIAGNOSIS — C44212 Basal cell carcinoma of skin of right ear and external auricular canal: Secondary | ICD-10-CM | POA: Diagnosis not present

## 2019-03-25 ENCOUNTER — Ambulatory Visit (INDEPENDENT_AMBULATORY_CARE_PROVIDER_SITE_OTHER): Payer: Medicare HMO | Admitting: *Deleted

## 2019-03-25 DIAGNOSIS — Z95810 Presence of automatic (implantable) cardiac defibrillator: Secondary | ICD-10-CM

## 2019-03-25 LAB — CUP PACEART REMOTE DEVICE CHECK
Battery Remaining Longevity: 23 mo
Battery Voltage: 2.93 V
Brady Statistic AP VP Percent: 0 %
Brady Statistic AP VS Percent: 0 %
Brady Statistic AS VP Percent: 91.93 %
Brady Statistic AS VS Percent: 8.07 %
Brady Statistic RA Percent Paced: 0 %
Brady Statistic RV Percent Paced: 92.68 %
Date Time Interrogation Session: 20201216044223
HighPow Impedance: 40 Ohm
HighPow Impedance: 50 Ohm
Implantable Lead Implant Date: 20070525
Implantable Lead Implant Date: 20070525
Implantable Lead Implant Date: 20070525
Implantable Lead Location: 753858
Implantable Lead Location: 753859
Implantable Lead Location: 753860
Implantable Lead Model: 4194
Implantable Lead Model: 5076
Implantable Lead Model: 6949
Implantable Pulse Generator Implant Date: 20170116
Lead Channel Impedance Value: 190 Ohm
Lead Channel Impedance Value: 304 Ohm
Lead Channel Impedance Value: 418 Ohm
Lead Channel Impedance Value: 418 Ohm
Lead Channel Impedance Value: 456 Ohm
Lead Channel Impedance Value: 551 Ohm
Lead Channel Pacing Threshold Amplitude: 0.625 V
Lead Channel Pacing Threshold Amplitude: 0.75 V
Lead Channel Pacing Threshold Pulse Width: 0.4 ms
Lead Channel Pacing Threshold Pulse Width: 0.4 ms
Lead Channel Sensing Intrinsic Amplitude: 13.75 mV
Lead Channel Sensing Intrinsic Amplitude: 13.75 mV
Lead Channel Sensing Intrinsic Amplitude: 3.125 mV
Lead Channel Sensing Intrinsic Amplitude: 3.125 mV
Lead Channel Setting Pacing Amplitude: 2 V
Lead Channel Setting Pacing Amplitude: 2 V
Lead Channel Setting Pacing Amplitude: 2 V
Lead Channel Setting Pacing Pulse Width: 0.4 ms
Lead Channel Setting Pacing Pulse Width: 0.4 ms
Lead Channel Setting Sensing Sensitivity: 0.45 mV

## 2019-03-26 NOTE — Telephone Encounter (Signed)
Dr. [name] please see email from pt's son regarding the prednisone.   FYI.

## 2019-03-30 ENCOUNTER — Other Ambulatory Visit: Payer: Self-pay

## 2019-03-30 ENCOUNTER — Ambulatory Visit (INDEPENDENT_AMBULATORY_CARE_PROVIDER_SITE_OTHER): Payer: Medicare HMO

## 2019-03-30 DIAGNOSIS — I4892 Unspecified atrial flutter: Secondary | ICD-10-CM | POA: Diagnosis not present

## 2019-03-30 DIAGNOSIS — Z7901 Long term (current) use of anticoagulants: Secondary | ICD-10-CM

## 2019-03-30 DIAGNOSIS — Z5181 Encounter for therapeutic drug level monitoring: Secondary | ICD-10-CM | POA: Diagnosis not present

## 2019-03-30 LAB — POCT INR: INR: 1.9 — AB (ref 2.0–3.0)

## 2019-03-30 NOTE — Patient Instructions (Signed)
Please take a whole tablet tonight, then continue dosage of 1 pill (5mg ) daily EXCEPT for 1/2 pill on Mondays & Fridays. Recheck in 5 weeks.

## 2019-04-15 NOTE — Progress Notes (Signed)
ICD remote 

## 2019-05-06 ENCOUNTER — Other Ambulatory Visit: Payer: Self-pay

## 2019-05-06 ENCOUNTER — Ambulatory Visit (INDEPENDENT_AMBULATORY_CARE_PROVIDER_SITE_OTHER): Payer: Medicare HMO

## 2019-05-06 DIAGNOSIS — Z7901 Long term (current) use of anticoagulants: Secondary | ICD-10-CM | POA: Diagnosis not present

## 2019-05-06 DIAGNOSIS — Z5181 Encounter for therapeutic drug level monitoring: Secondary | ICD-10-CM | POA: Diagnosis not present

## 2019-05-06 DIAGNOSIS — I4892 Unspecified atrial flutter: Secondary | ICD-10-CM

## 2019-05-06 LAB — POCT INR: INR: 1.9 — AB (ref 2.0–3.0)

## 2019-05-06 NOTE — Patient Instructions (Signed)
-   START NEW DOSAGE of warfarin 1 pill (5mg ) daily.  - Recheck in 5 weeks.

## 2019-05-18 ENCOUNTER — Other Ambulatory Visit: Payer: Self-pay | Admitting: Pulmonary Disease

## 2019-05-18 ENCOUNTER — Other Ambulatory Visit: Payer: Self-pay | Admitting: Family Medicine

## 2019-05-18 NOTE — Telephone Encounter (Signed)
Name of Medication: Alprazolam [facility] of [facility] [address] or Written Date and Quantity: 01/14/19, #30 Last Office Visit and Type: 11/11/18, AWV prt 2 Next Office Visit and Type: 11/16/19, AWV prt 2 Last Controlled Substance Agreement Date: none Last UDS: none

## 2019-05-20 NOTE — Telephone Encounter (Signed)
ERx 

## 2019-05-27 ENCOUNTER — Telehealth: Payer: Self-pay | Admitting: Cardiology

## 2019-05-27 DIAGNOSIS — J019 Acute sinusitis, unspecified: Secondary | ICD-10-CM | POA: Diagnosis not present

## 2019-05-27 DIAGNOSIS — J320 Chronic maxillary sinusitis: Secondary | ICD-10-CM | POA: Diagnosis not present

## 2019-05-27 NOTE — Telephone Encounter (Signed)
Patient started on Levaquin and prednisone and wants to know how to adjust coumadin

## 2019-05-28 NOTE — Telephone Encounter (Signed)
Called and spoke to pt. Pt stated that he has been on the prednisone for a while and is taking his first dose of Levaquin today. Informed him the Levaquin can interact with his warfarin.  He stated that he is taking Levaquin 500 mg daily. Pt stated that he will be on it for a while. [facility],  [facility] stated the order is written for pt to be on Levaquin 500 mg daily for 10 days. Informed him to eat extra greens over the weekend. Pt stated he could do that. Scheduled pt to get his INR checked on Monday 06/01/2019 at the [facility].

## 2019-06-01 ENCOUNTER — Ambulatory Visit (INDEPENDENT_AMBULATORY_CARE_PROVIDER_SITE_OTHER): Payer: Medicare HMO

## 2019-06-01 ENCOUNTER — Other Ambulatory Visit: Payer: Self-pay

## 2019-06-01 DIAGNOSIS — Z5181 Encounter for therapeutic drug level monitoring: Secondary | ICD-10-CM

## 2019-06-01 DIAGNOSIS — I4892 Unspecified atrial flutter: Secondary | ICD-10-CM | POA: Diagnosis not present

## 2019-06-01 DIAGNOSIS — Z7901 Long term (current) use of anticoagulants: Secondary | ICD-10-CM | POA: Diagnosis not present

## 2019-06-01 LAB — POCT INR: INR: 3.4 — AB (ref 2.0–3.0)

## 2019-06-01 NOTE — Patient Instructions (Signed)
-   Since you're on Levaquin, skip warfarin today - tomorrow, start taking 1/2 tablet of warfarin until recheck next week

## 2019-06-02 ENCOUNTER — Other Ambulatory Visit: Payer: Self-pay | Admitting: Cardiology

## 2019-06-08 ENCOUNTER — Ambulatory Visit (INDEPENDENT_AMBULATORY_CARE_PROVIDER_SITE_OTHER): Payer: Medicare HMO

## 2019-06-08 ENCOUNTER — Other Ambulatory Visit: Payer: Self-pay

## 2019-06-08 DIAGNOSIS — I4892 Unspecified atrial flutter: Secondary | ICD-10-CM

## 2019-06-08 DIAGNOSIS — Z5181 Encounter for therapeutic drug level monitoring: Secondary | ICD-10-CM | POA: Diagnosis not present

## 2019-06-08 DIAGNOSIS — Z7901 Long term (current) use of anticoagulants: Secondary | ICD-10-CM

## 2019-06-08 LAB — POCT INR: INR: 2.7 (ref 2.0–3.0)

## 2019-06-08 NOTE — Patient Instructions (Signed)
Stay on current warfarin dosage of 1 tablet every day. Recheck in 2 weeks.

## 2019-06-18 DIAGNOSIS — I5021 Acute systolic (congestive) heart failure: Secondary | ICD-10-CM | POA: Insufficient documentation

## 2019-06-18 NOTE — Progress Notes (Signed)
Cardiology Office Note   Date:  06/19/2019   ID:  [name], DOB [birth year], MRN [id number]  PCP:  [name] Bush, MD  Cardiologist:   No primary care provider on file.   Chief Complaint  Patient presents with  . Leg Swelling      History of Present Illness: [name] is a 86 y.o. male who presents for follow up of his cardiomyopathy and atrial flutter.  EF on the last echo that I ordered in Jan was 20 - 25%.   He called yesterday because of increased swelling.  He is added to my schedule.    He said that he was getting short of breath but he also was treated for sinus infection recently and is feeling a little bit better as he has been on antibiotics.  He had some fluid actually in his knees.  He had a little down on his ankles.  His weight went up about four 5 pounds 246 but he took a little extra Lasix and he thinks his weight is back down.  He is not describing PND or orthopnea.  He is not describing abdominal distention.  He is not acutely short of breath with activities.  He does have some chronic fatigue.  He is not having any palpitations, presyncope or syncope.  He is having no chest pain.   Past Medical History:  Diagnosis Date  . AICD (automatic cardioverter/defibrillator) present    s/p gen change 04/2015 w/ MDT [name] Crt-D  DTBA1D1, Serial number [id number]  . Allergic rhinitis    [name]  . Anemia   . Anxiety   . Arthritis   . Atrial flutter ([place])    A.  Status post cardioversion; B.  Tikosyn therapy - failed, remains in aflutter  . Bilateral pneumonia 11/02/2013   treated with levaquin  . BPH (benign prostatic hyperplasia)   . 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 Lucky Cowboy)  . Chronic lung disease    spirometry 2015 - no obstruction, + mild restrictive lung disease  . Chronic sinusitis   . Chronic systolic congestive heart failure ([place])   . Extrinsic asthma    [name]  . Fall with injury 05/28/2017  . GERD  (gastroesophageal reflux disease) 2010   h/o esophageal stricture with dilation, LA grade C reflux esophagitis by EGD 2010  . Goiter   . History of diverticulitis of colon   . History of GI bleed    Secondary to hemorrhoids  . IBS (irritable bowel syndrome)   . LBBB (left bundle branch block)   . Multiple pulmonary nodules 06/2013   RUL ([facility]. Adam Phenix at [facility]) - ?vasculitis as of last CT at [facility]  . NICM (nonischemic cardiomyopathy) [facility])    Cardiac catheterization March 2006 without coronary disease; EF 25% 2018 Jan  . Porphyria [facility])   . Sinus congestion 09/25/2010    Past Surgical History:  Procedure Laterality Date  . BACK SURGERY  1997   bulging disks  . BiV-AICD implant     [employer]  . CARDIAC CATHETERIZATION  06/22/04   Severe, nonischemic cardiomyopathy,EF 25-30%  . CARDIOVERSION  02/22/09   AFlutter-([facility])  . CHOLECYSTECTOMY    . COLONOSCOPY  10/99   Divertic, splenic, hepatic fleure only  . COLONOSCOPY  10/21/08   aborted-divertics, int hemms (Dr. Vira Agar)  . CORONARY ANGIOPLASTY    . EP IMPLANTABLE DEVICE N/A 04/25/2015   Procedure: BIV ICD Generator  Changeout;  Surgeon: Evans Lance, MD;  Location: [place] [facility];  Service: Cardiovascular;  Laterality: N/A;  . ESOPHAGEAL DILATION  02/18/98  . ESOPHAGOGASTRODUODENOSCOPY  01/1998   stricture, sliding HH, GERD  . ESOPHAGOGASTRODUODENOSCOPY  04/08/01   stricture, gastritis, HH, GERD-no dilation(Dr. Henrene Pastor)  . ESOPHAGOGASTRODUODENOSCOPY  12/22/04   stricture; gastritis; duodenitis, GERD  . ESOPHAGOGASTRODUODENOSCOPY  10/21/08   Reflux esophagitis; Erythem. Duod. (Dr. Vira Agar)  . ESOPHAGOGASTRODUODENOSCOPY  08/2013   erythema gastric fundus - minimal gastritis, HH ([place])  . ESOPHAGOGASTRODUODENOSCOPY  08/2016   dysphagia - mod schatzki rin, dilated Tiffany Kocher)  . ESOPHAGOGASTRODUODENOSCOPY (EGD) WITH PROPOFOL N/A 08/24/2016   mod schatzki ring dilated, duodenal deformity Vira Agar, Gavin Pound, MD)  . goiter  removal    . HERNIA REPAIR    . HERNIA REPAIR  01/30/02   Dr. Hassell Done  . INSERT / REPLACE / REMOVE PACEMAKER    . NOSE SURGERY  1971  . PENILE PROSTHESIS IMPLANT  2007   [place]  . PFT  10/2013   FVC 61%, FEV1 63%, ratio 0.76  . Pulmonary eval  04/2002   ([facility]) Chronic congestive symptoms  . REVERSE SHOULDER ARTHROPLASTY Right 01/17/2016   Procedure: REVERSE SHOULDER ARTHROPLASTY;  Surgeon: Corky Mull, MD;  Location: [facility] ORS;  Service: Orthopedics;  Laterality: Right;  . REVERSE TOTAL SHOULDER ARTHROPLASTY Right 01/2016   Poggi  . US ECHOCARDIOGRAPHY  07/13/04   EF 25-30%, Mod LVH; LA severe dilation; Mild AR; IRTR  . US ECHOCARDIOGRAPHY  11/27/06   hypokinesis posterior wall, EF 35%; mild AR     Current Outpatient Medications  Medication Sig Dispense Refill  . acetaminophen (TYLENOL) 500 MG tablet Take 250 mg by mouth daily as needed. Pain    . ALPRAZolam (XANAX) 0.25 MG tablet TAKE 1 TABLET (0.25 MG TOTAL) BY MOUTH AT BEDTIME.* INSURANCE ONLY COVERS 30 DAYS 30 tablet 0  . alum & mag hydroxide-simeth (MAALOX/MYLANTA) 200-200-20 MG/5ML suspension Take 15 mLs by mouth daily as needed for indigestion or heartburn.    . budesonide-formoterol (SYMBICORT) 160-4.5 MCG/ACT inhaler Inhale 2 puffs into the lungs at bedtime.    . carvedilol (COREG) 6.25 MG tablet TAKE 1 AND 1/2 TABLETS BY MOUTH TWICE DAILY 270 tablet 1  . fluticasone (FLONASE) 50 MCG/ACT nasal spray Place 1 spray into both nostrils daily.    . furosemide (LASIX) 40 MG tablet TAKE 1 TABLET BY MOUTH EVERY DAY 90 tablet 3  . guaiFENesin 200 MG tablet Take 400 mg by mouth every 4 (four) hours as needed for cough or to loosen phlegm.    . levalbuterol (XOPENEX HFA) 45 MCG/ACT inhaler Inhale 1-2 puffs into the lungs every 6 (six) hours as needed for wheezing or shortness of breath. 1 Inhaler 1  . levofloxacin (LEVAQUIN) 25 MG/ML solution Take by mouth daily.    . Loratadine 10 MG CAPS Take 1 capsule by mouth daily as needed  (allergies).     . montelukast (SINGULAIR) 10 MG tablet Take 10 mg by mouth daily as needed (BREATHING).     . polyethylene glycol (MIRALAX / GLYCOLAX) packet Take 17 g by mouth daily as needed (constipation).     . predniSONE (DELTASONE) 10 MG tablet TAKE 1 TABLET (10 MG TOTAL) BY MOUTH DAILY WITH BREAKFAST. 30 tablet 1  . predniSONE (DELTASONE) 5 MG tablet TAKE 1 TABLET BY MOUTH EVERY DAY WITH BREAKFAST 30 tablet 3  . promethazine (PHENERGAN) 25 MG suppository Place 1 suppository (25 mg total) rectally every 6 (six)  hours as needed for nausea or vomiting. 12 each 3  . tamsulosin (FLOMAX) 0.4 MG CAPS capsule Take 1 capsule (0.4 mg total) by mouth daily. 30 capsule 11  . warfarin (COUMADIN) 5 MG tablet Take 1/2 to 1 tablet by mouth daily as directed by coumadin clinic 90 tablet 1   No current facility-administered medications for this visit.    Allergies:   Carafate [sucralfate], Clarithromycin, Famotidine, Levaquin [levofloxacin], Omeprazole, Oxytetracycline, Penicillins, Proton pump inhibitors, Ranitidine, and Doxycycline   ROS:  Please see the history of present illness.   Otherwise, review of systems are positive for none.   All other systems are reviewed and negative.    PHYSICAL EXAM: VS:  BP 120/68   Pulse 67   Ht 5\' 9"  (1.753 m)   Wt 142 lb (64.4 kg)   BMI 20.97 kg/m  , BMI Body mass index is 20.97 kg/m. GENERAL:  Well appearing NECK: Positive HJR and very mild jugular venous distention, waveform within normal limits, carotid upstroke brisk and symmetric, no bruits, no thyromegaly LUNGS:  Clear to auscultation bilaterally CHEST:  ICD pocket well healed.  HEART:  PMI not displaced or sustained,S1 and S2 within normal limits, no S3, no S4, no clicks, no rubs, soft  diastolic murmur radiating slightly to the axilla, murmurs ABD:  Flat, positive bowel sounds normal in frequency in pitch, no bruits, no rebound, no guarding, no midline pulsatile mass, no hepatomegaly, no  splenomegaly EXT:  2 plus pulses throughout, no edema, no cyanosis no clubbing   EKG:  EKG is not ordered today.    Recent Labs: 11/06/2018: ALT 12; BUN 20; Creatinine, Ser 1.25; Hemoglobin 13.1; Platelets 116.0; Potassium 4.4; Sodium 140; TSH 1.96    Lipid Panel    Component Value Date/Time   CHOL 183 11/06/2018 1206   CHOL 115 09/01/2013 0507   TRIG 130.0 11/06/2018 1206   TRIG 120 09/01/2013 0507   HDL 49.50 11/06/2018 1206   CHOLHDL 4 11/06/2018 1206   VLDL 26.0 11/06/2018 1206   LDLCALC 107 (H) 11/06/2018 1206      Wt Readings from Last 3 Encounters:  06/19/19 142 lb (64.4 kg)  12/22/18 140 lb 6.4 oz (63.7 kg)  11/11/18 139 lb 8 oz (63.3 kg)      Other studies Reviewed: Additional studies/ records that were reviewed today include: None. Review of the above records demonstrates:  Please see elsewhere in the note.     ASSESSMENT AND PLAN:  Acute systolic HF -  I think he probably was a little bit volume overloaded.  We talked about as needed dosing of his diuretic.  He has been using potassium chloride to season with and I am going to check a basic metabolic profile today.  We talked again about salt restriction.  I am not going to change his meds at this point however.   Atrial flutter -   [name] has a CHA2DS2 - VASc score of 3.    He tolerates anticoagulation.  He is has no symptomatic palpitations.   Pulmonary HTN - No change in therapy.  No further imaging.  He really actually does well with this.\  Covid education - We talked at length about the vaccines.  He might consider the [employer].  Current medicines are reviewed at length with the patient today.  The patient does not have concerns regarding medicines.  The following changes have been made:  no change  Labs/ tests ordered today include:  Orders Placed This Encounter  Procedures  . Basic metabolic panel     Disposition:   FU with me in 3 months.      Signed, [name], MD  06/19/2019 10:17 AM    [facility]

## 2019-06-19 ENCOUNTER — Encounter: Payer: Self-pay | Admitting: Cardiology

## 2019-06-19 ENCOUNTER — Other Ambulatory Visit: Payer: Self-pay

## 2019-06-19 ENCOUNTER — Ambulatory Visit: Payer: Medicare HMO | Admitting: Cardiology

## 2019-06-19 VITALS — BP 120/68 | HR 67 | Ht 69.0 in | Wt 142.0 lb

## 2019-06-19 DIAGNOSIS — I4892 Unspecified atrial flutter: Secondary | ICD-10-CM | POA: Diagnosis not present

## 2019-06-19 DIAGNOSIS — I272 Pulmonary hypertension, unspecified: Secondary | ICD-10-CM

## 2019-06-19 DIAGNOSIS — Z7189 Other specified counseling: Secondary | ICD-10-CM | POA: Diagnosis not present

## 2019-06-19 DIAGNOSIS — I5021 Acute systolic (congestive) heart failure: Secondary | ICD-10-CM

## 2019-06-19 LAB — BASIC METABOLIC PANEL
BUN/Creatinine Ratio: 18 (ref 10–24)
BUN: 25 mg/dL (ref 8–27)
CO2: 26 mmol/L (ref 20–29)
Calcium: 9.4 mg/dL (ref 8.6–10.2)
Chloride: 96 mmol/L (ref 96–106)
Creatinine, Ser: 1.36 mg/dL — ABNORMAL HIGH (ref 0.76–1.27)
GFR calc Af Amer: 54 mL/min/{1.73_m2} — ABNORMAL LOW (ref >59)
GFR calc non Af Amer: 47 mL/min/{1.73_m2} — ABNORMAL LOW (ref >59)
Glucose: 82 mg/dL (ref 65–99)
Potassium: 4.7 mmol/L (ref 3.5–5.2)
Sodium: 137 mmol/L (ref 134–144)

## 2019-06-19 NOTE — Patient Instructions (Signed)
Medication Instructions:  No changes *If you need a refill on your cardiac medications before your next appointment, please call your pharmacy*  Lab Work: Your physician recommends that you return for lab work today (BMP) If you have labs (blood work) drawn today and your tests are completely normal, you will receive your results only by: Marland Kitchen MyChart Message (if you have MyChart) OR . A paper copy in the mail If you have any lab test that is abnormal or we need to change your treatment, we will call you to review the results.   Testing/Procedures: None needed this visit  Follow-Up: At [facility], you and your health needs are our priority.  As part of our continuing mission to provide you with exceptional heart care, we have created designated Provider Care Teams.  These Care Teams include your primary Cardiologist (physician) and Advanced Practice Providers (APPs -  Physician Assistants and Nurse Practitioners) who all work together to provide you with the care you need, when you need it.  We recommend signing up for the patient portal called "MyChart".  Sign up information is provided on this After Visit Summary.  MyChart is used to connect with patients for Virtual Visits (Telemedicine).  Patients are able to view lab/test results, encounter notes, upcoming appointments, etc.  Non-urgent messages can be sent to your provider as well.   To learn more about what you can do with MyChart, go to [email or web address].    Your next appointment:   3 month(s)  The format for your next appointment:   In Person  Provider:   [name], MD

## 2019-06-22 ENCOUNTER — Other Ambulatory Visit: Payer: Self-pay

## 2019-06-22 ENCOUNTER — Ambulatory Visit (INDEPENDENT_AMBULATORY_CARE_PROVIDER_SITE_OTHER): Payer: Medicare HMO

## 2019-06-22 DIAGNOSIS — Z5181 Encounter for therapeutic drug level monitoring: Secondary | ICD-10-CM | POA: Diagnosis not present

## 2019-06-22 DIAGNOSIS — I4892 Unspecified atrial flutter: Secondary | ICD-10-CM

## 2019-06-22 DIAGNOSIS — Z7901 Long term (current) use of anticoagulants: Secondary | ICD-10-CM

## 2019-06-22 LAB — POCT INR: INR: 3.9 — AB (ref 2.0–3.0)

## 2019-06-22 NOTE — Patient Instructions (Signed)
Skip warfarin tonight, take 1/2 tablet tomorrow, then resume warfarin dosage of 1 tablet every day. Recheck in 2 weeks.  BE CONSISTENT WITH YOUR GREENS INTAKE - pick a day or days to have your greens and do that every week.

## 2019-06-23 DIAGNOSIS — R69 Illness, unspecified: Secondary | ICD-10-CM | POA: Diagnosis not present

## 2019-06-24 ENCOUNTER — Ambulatory Visit (INDEPENDENT_AMBULATORY_CARE_PROVIDER_SITE_OTHER): Payer: Medicare HMO | Admitting: *Deleted

## 2019-06-24 DIAGNOSIS — Z95810 Presence of automatic (implantable) cardiac defibrillator: Secondary | ICD-10-CM | POA: Diagnosis not present

## 2019-06-24 LAB — CUP PACEART REMOTE DEVICE CHECK
Battery Remaining Longevity: 21 mo
Battery Voltage: 2.92 V
Brady Statistic AP VP Percent: 0 %
Brady Statistic AP VS Percent: 0 %
Brady Statistic AS VP Percent: 97.38 %
Brady Statistic AS VS Percent: 2.62 %
Brady Statistic RA Percent Paced: 0 %
Brady Statistic RV Percent Paced: 97.11 %
Date Time Interrogation Session: 20210317033623
HighPow Impedance: 43 Ohm
HighPow Impedance: 52 Ohm
Implantable Lead Implant Date: 20070525
Implantable Lead Implant Date: 20070525
Implantable Lead Implant Date: 20070525
Implantable Lead Location: 753858
Implantable Lead Location: 753859
Implantable Lead Location: 753860
Implantable Lead Model: 4194
Implantable Lead Model: 5076
Implantable Lead Model: 6949
Implantable Pulse Generator Implant Date: 20170116
Lead Channel Impedance Value: 190 Ohm
Lead Channel Impedance Value: 304 Ohm
Lead Channel Impedance Value: 361 Ohm
Lead Channel Impedance Value: 418 Ohm
Lead Channel Impedance Value: 475 Ohm
Lead Channel Impedance Value: 513 Ohm
Lead Channel Pacing Threshold Amplitude: 0.625 V
Lead Channel Pacing Threshold Amplitude: 0.875 V
Lead Channel Pacing Threshold Pulse Width: 0.4 ms
Lead Channel Pacing Threshold Pulse Width: 0.4 ms
Lead Channel Sensing Intrinsic Amplitude: 10 mV
Lead Channel Sensing Intrinsic Amplitude: 10 mV
Lead Channel Sensing Intrinsic Amplitude: 3.25 mV
Lead Channel Sensing Intrinsic Amplitude: 3.25 mV
Lead Channel Setting Pacing Amplitude: 2 V
Lead Channel Setting Pacing Amplitude: 2 V
Lead Channel Setting Pacing Amplitude: 2 V
Lead Channel Setting Pacing Pulse Width: 0.4 ms
Lead Channel Setting Pacing Pulse Width: 0.4 ms
Lead Channel Setting Sensing Sensitivity: 0.45 mV

## 2019-06-25 NOTE — Progress Notes (Signed)
ICD Remote  

## 2019-06-30 DIAGNOSIS — J45909 Unspecified asthma, uncomplicated: Secondary | ICD-10-CM | POA: Diagnosis not present

## 2019-06-30 DIAGNOSIS — D6869 Other thrombophilia: Secondary | ICD-10-CM | POA: Diagnosis not present

## 2019-06-30 DIAGNOSIS — E261 Secondary hyperaldosteronism: Secondary | ICD-10-CM | POA: Diagnosis not present

## 2019-06-30 DIAGNOSIS — R69 Illness, unspecified: Secondary | ICD-10-CM | POA: Diagnosis not present

## 2019-06-30 DIAGNOSIS — K5900 Constipation, unspecified: Secondary | ICD-10-CM | POA: Diagnosis not present

## 2019-06-30 DIAGNOSIS — Z008 Encounter for other general examination: Secondary | ICD-10-CM | POA: Diagnosis not present

## 2019-06-30 DIAGNOSIS — N400 Enlarged prostate without lower urinary tract symptoms: Secondary | ICD-10-CM | POA: Diagnosis not present

## 2019-06-30 DIAGNOSIS — I4892 Unspecified atrial flutter: Secondary | ICD-10-CM | POA: Diagnosis not present

## 2019-06-30 DIAGNOSIS — I4891 Unspecified atrial fibrillation: Secondary | ICD-10-CM | POA: Diagnosis not present

## 2019-06-30 DIAGNOSIS — I509 Heart failure, unspecified: Secondary | ICD-10-CM | POA: Diagnosis not present

## 2019-06-30 DIAGNOSIS — I110 Hypertensive heart disease with heart failure: Secondary | ICD-10-CM | POA: Diagnosis not present

## 2019-07-01 ENCOUNTER — Other Ambulatory Visit: Payer: Self-pay | Admitting: Cardiology

## 2019-07-01 NOTE — Telephone Encounter (Signed)
Drug-Drug: warfarin and levofloxacin Hypoprothrombinemic effects of Anticoagulants may be increased by Quinolones. Dosage reduction of Anticoagulants may be required.

## 2019-07-06 ENCOUNTER — Ambulatory Visit: Payer: Medicare HMO | Admitting: Urology

## 2019-07-06 ENCOUNTER — Other Ambulatory Visit: Payer: Self-pay | Admitting: Radiology

## 2019-07-06 ENCOUNTER — Ambulatory Visit (INDEPENDENT_AMBULATORY_CARE_PROVIDER_SITE_OTHER): Payer: Medicare HMO

## 2019-07-06 ENCOUNTER — Other Ambulatory Visit: Payer: Self-pay

## 2019-07-06 ENCOUNTER — Encounter: Payer: Self-pay | Admitting: Urology

## 2019-07-06 ENCOUNTER — Ambulatory Visit
Admission: RE | Admit: 2019-07-06 | Discharge: 2019-07-06 | Disposition: A | Discharge status: Home / Self Care | Payer: Medicare HMO | Source: Ambulatory Visit | Attending: Urology | Admitting: Urology

## 2019-07-06 ENCOUNTER — Telehealth: Payer: Self-pay | Admitting: *Deleted

## 2019-07-06 VITALS — BP 117/75 | HR 77 | Ht 69.0 in | Wt 142.0 lb

## 2019-07-06 DIAGNOSIS — Z5181 Encounter for therapeutic drug level monitoring: Secondary | ICD-10-CM | POA: Diagnosis not present

## 2019-07-06 DIAGNOSIS — R351 Nocturia: Secondary | ICD-10-CM

## 2019-07-06 DIAGNOSIS — R3129 Other microscopic hematuria: Secondary | ICD-10-CM

## 2019-07-06 DIAGNOSIS — N401 Enlarged prostate with lower urinary tract symptoms: Secondary | ICD-10-CM

## 2019-07-06 DIAGNOSIS — R109 Unspecified abdominal pain: Secondary | ICD-10-CM

## 2019-07-06 DIAGNOSIS — I4892 Unspecified atrial flutter: Secondary | ICD-10-CM

## 2019-07-06 DIAGNOSIS — Z7901 Long term (current) use of anticoagulants: Secondary | ICD-10-CM

## 2019-07-06 DIAGNOSIS — K5900 Constipation, unspecified: Secondary | ICD-10-CM | POA: Diagnosis not present

## 2019-07-06 HISTORY — DX: Essential (primary) hypertension: I10

## 2019-07-06 LAB — MICROSCOPIC EXAMINATION
Bacteria, UA: NONE SEEN
Epithelial Cells (non renal): NONE SEEN /hpf (ref 0–10)

## 2019-07-06 LAB — URINALYSIS, COMPLETE
Bilirubin, UA: NEGATIVE
Glucose, UA: NEGATIVE
Ketones, UA: NEGATIVE
Leukocytes,UA: NEGATIVE
Nitrite, UA: NEGATIVE
Protein,UA: NEGATIVE
Specific Gravity, UA: 1.02 (ref 1.005–1.030)
Urobilinogen, Ur: 0.2 mg/dL (ref 0.2–1.0)
pH, UA: 5.5 (ref 5.0–7.5)

## 2019-07-06 LAB — POCT INR: INR: 4.1 — AB (ref 2.0–3.0)

## 2019-07-06 MED ORDER — IOHEXOL 300 MG/ML  SOLN
100.0000 mL | Freq: Once | INTRAMUSCULAR | Status: AC | PRN
  Administered 2019-07-06: 100 mL via INTRAVENOUS

## 2019-07-06 NOTE — Telephone Encounter (Signed)
-----   Message from Billey Co, MD sent at 07/06/2019  3:51 PM EDT ----- No kidney stones or kidney abnormalities on his CT today. His pain is likely musculoskeltal. Recommend NSAIDs, ice, rest, lidocaine patch, see PCP if not improving.  Thanks Nickolas Madrid, MD 07/06/2019

## 2019-07-06 NOTE — Telephone Encounter (Signed)
Notified patient as instructed, patient pleased. Discussed follow-up appointments, patient agrees  

## 2019-07-06 NOTE — Progress Notes (Signed)
This patient has received less than 33ml's of IV Omni 300 contrast extravasation into left forearm during a Hematuria CT exam.  The exam was performed on 07/06/19  Site / affected area assessed by Dr. Anselm Pancoast. Take home instructions given to patient.

## 2019-07-06 NOTE — Progress Notes (Signed)
DVID BEDI    [id number]   11-19-1931  Type of procedure: CT Scan Abd/Pel  07/06/2019  During your examination at [facility] some of the x-ray dye leaked into the tissues around the vein that the x-ray dye was given through.  What should you do?  1. Apply ice 20 minutes four times a day for three days.  2. Keep the affected extremity elevated above the rest of your body for 48 hours.  3. If you develop any of the following signs or symptoms, please contact Radiology at [phone].  Increased pain  Increasing swelling   Change in sensation (ex. Numbness, tingling)  Development of redness or increase in warmth around the affected area  Increasing hardness  Blistering   I have read and understand these instructions.  Any questions I raised have been discussed to my satisfaction.  I understand that failure to follow these instructions may result in additional complications.

## 2019-07-06 NOTE — Progress Notes (Signed)
   07/06/2019 11:24 AM   [name] [birth year] [id number]  Reason for visit: Right flank pain  HPI: I saw [name] as an add-on in urology clinic today for right-sided flank pain.  He is an 86 year old comorbid male who I followed in the past for BPH and urinary symptoms that are well controlled on Flomax.  He reports 2-3 weeks of worsening right-sided flank pain.  He denies any gross hematuria or urinary symptoms.  He feels that if he drinks more fluids it worsens his flank pain, as well as physical activity may make the pain worse.  He denies any inciting events.  He reports that last night his pain acutely worsened and he took a Tylenol with some improvement, but is having trouble sleeping secondary to the pain.  Urinalysis today with 0-5 WBCs, 3-10 RBCs, no crystals, no bacteria, no yeast, nitrite negative, no leukocytes.  I personally reviewed the KUB from today, no evidence of radiopaque stone.  On exam, there are no skin lesions on the right flank or low back.  He has subtle bruising throughout secondary to Coumadin, and he denies any trauma or fall.  We discussed that with his symptoms of ongoing right-sided flank pain as well as microscopic hematuria I would recommend further imaging for evaluation.  By performing a CT urogram we can work-up his new microscopic hematuria, as well as possibly identify any stone or other etiology of his flank pain.  We discussed likely need for cystoscopy to complete hematuria work-up in the future pending CT results.  CT hematuria ordered today Call with CT results  I spent 30 total minutes on the day of the encounter including pre-visit review of the medical record, face-to-face time with the patient, and post visit ordering of labs/imaging/tests.  Billey Co, [facility] [address], [place], [place] [address] [phone]

## 2019-07-06 NOTE — Progress Notes (Signed)
Patient ID: [name], male   DOB: Apr 12, 1931, 86 y.o.   MRN: [id number]   CT contacted me about a small amount of contrast extravasation and hematoma at IV site.  According to technologist, majority of the contrast was administered before the extravasation was identified.  On exam, focal bulge in left medial upper forearm.  Skin is soft and there is a small skin tear with bleeding.  Skin is warm and suspect the extravasation is less than 20 ml.  Patient is on anticoagulation and has additional bruising.  Bleeding stopped with manual compression and a compression dressing was placed.  Patient is asymptomatic with normal function in left hand and wrist.  Patient given ice pack and instructions for taking care of site.  Patient was discharged to his next appointment.

## 2019-07-06 NOTE — Patient Instructions (Signed)
-   have a serving of greens today -skip warfarin tonight and tomorrow - on Wednesdays, resume warfarin dosage of 1 tablet every day. - recheck in 2 weeks.

## 2019-07-08 ENCOUNTER — Telehealth: Payer: Self-pay | Admitting: Family Medicine

## 2019-07-08 NOTE — Telephone Encounter (Signed)
May place at 12:30pm tomorrow.  Thank you.

## 2019-07-08 NOTE — Telephone Encounter (Signed)
Pt called wanting to schedule appointment with Dr Darnell Level.  He stated he ONLY wants to see dr g  He stated he is having right back pain near buttock and going down inside of leg.  When is up and walking around he does better.  He cannot lay on that side at all.  He also states he has a place on stomach 1 1/2" above navel.  He stated he started 1 bump (blister) now he 3-4 bumps that itches not painful   Can pt be worked in

## 2019-07-08 NOTE — Telephone Encounter (Signed)
Pt aware.

## 2019-07-09 ENCOUNTER — Other Ambulatory Visit: Payer: Self-pay

## 2019-07-09 ENCOUNTER — Ambulatory Visit (INDEPENDENT_AMBULATORY_CARE_PROVIDER_SITE_OTHER): Payer: Medicare HMO | Admitting: Family Medicine

## 2019-07-09 ENCOUNTER — Encounter: Payer: Self-pay | Admitting: Family Medicine

## 2019-07-09 VITALS — BP 110/72 | HR 67 | Temp 97.5°F | Ht 69.5 in | Wt 145.3 lb

## 2019-07-09 DIAGNOSIS — M545 Low back pain, unspecified: Secondary | ICD-10-CM | POA: Insufficient documentation

## 2019-07-09 DIAGNOSIS — Z7901 Long term (current) use of anticoagulants: Secondary | ICD-10-CM | POA: Diagnosis not present

## 2019-07-09 DIAGNOSIS — M79604 Pain in right leg: Secondary | ICD-10-CM | POA: Insufficient documentation

## 2019-07-09 NOTE — Patient Instructions (Addendum)
I think you have lower back muscle strain  Try gentle stretching exercises provided today.  May use heating pad to the area to stimulate circulation.  Try off levaquin spray.  If persistent pain, may try voltaren gel over the counter, small amount to painful area twice daily.  If persistent pain, let us know for xrays.

## 2019-07-09 NOTE — Assessment & Plan Note (Signed)
Recent supratherapeutic INR likely from levaquin use - may improve with stopping this. Reviewed caution with topical voltaren use if needed.

## 2019-07-09 NOTE — Progress Notes (Addendum)
This visit was conducted in person.  BP 110/72 (BP Location: Left Arm, Patient Position: Sitting, Cuff Size: Normal)   Pulse 67   Temp (!) 97.5 F (36.4 C) (Temporal)   Ht 5' 9.5" (1.765 m)   Wt 145 lb 5 oz (65.9 kg)   SpO2 100%   BMI 21.15 kg/m    CC: R hip pain, rash Subjective:    Patient ID: [name], male    DOB: October 01, 1931, 86 y.o.   MRN: [id number]  HPI: MARTON BRAATZ is a 86 y.o. male presenting on 07/09/2019 for Hip Pain (C/o right hip pain radiating down right leg.  Started a few weeks ago.  Pt accompanied by wife, Peggy- temp 97.6.) and Rash (C/o rash on mid abd.  Area is red and itchy.  Noticed a few wks ago. )   Several weeks of R lateral hip pain with radiation down leg. Notes some discomfort down interior leg to knee. Worse pain at night time. No numbness or weakness of leg. No bowel/bladder accidents. No saddle anesthesia. Tried nothing for the pain besides 1/2 tylenol with benefit. Denies recent falls or injury. Denies heavy lifting or recent straining. Treating aspercream.   Saw urologist for hematuria with R flank pain - reassuring eval. CT showed some constipation. Drinks 32 oz water. Has miralax but not using regularly.   Small rash present for 2 weeks, initially itchy and red, now improving. Treated with itching cream with benefit. No tick bites.   On chronic prednisone 10mg  daily for lung issues (tries to take less than this).  On chronic coumadin - recently thinner. On combo levaquin sinus rinse nightly through ENT ([place]) for a month.  Lab Results  Component Value Date   INR 4.1 (A) 07/06/2019   INR 3.9 (A) 06/22/2019   INR 2.7 06/08/2019        Relevant past medical, surgical, family and social history reviewed and updated as indicated. Interim medical history since our last visit reviewed. Allergies and medications reviewed and updated. Outpatient Medications Prior to Visit  Medication Sig Dispense Refill  . acetaminophen  (TYLENOL) 500 MG tablet Take 250 mg by mouth daily as needed. Pain    . ALPRAZolam (XANAX) 0.25 MG tablet TAKE 1 TABLET (0.25 MG TOTAL) BY MOUTH AT BEDTIME.* INSURANCE ONLY COVERS 30 DAYS 30 tablet 0  . alum & mag hydroxide-simeth (MAALOX/MYLANTA) 200-200-20 MG/5ML suspension Take 15 mLs by mouth daily as needed for indigestion or heartburn.    . budesonide-formoterol (SYMBICORT) 160-4.5 MCG/ACT inhaler Inhale 2 puffs into the lungs at bedtime.    . carvedilol (COREG) 6.25 MG tablet TAKE 1 AND 1/2 TABLETS BY MOUTH TWICE DAILY 270 tablet 1  . fluticasone (FLONASE) 50 MCG/ACT nasal spray Place 1 spray into both nostrils daily.    . furosemide (LASIX) 40 MG tablet TAKE 1 TABLET BY MOUTH EVERY DAY 90 tablet 3  . guaiFENesin 200 MG tablet Take 400 mg by mouth every 4 (four) hours as needed for cough or to loosen phlegm.    . levalbuterol (XOPENEX HFA) 45 MCG/ACT inhaler Inhale 1-2 puffs into the lungs every 6 (six) hours as needed for wheezing or shortness of breath. 1 Inhaler 1  . levofloxacin (LEVAQUIN) 25 MG/ML solution Take by mouth daily.    . Loratadine 10 MG CAPS Take 1 capsule by mouth daily as needed (allergies).     . montelukast (SINGULAIR) 10 MG tablet Take 10 mg by mouth daily as needed (BREATHING).     Marland Kitchen  polyethylene glycol (MIRALAX / GLYCOLAX) packet Take 17 g by mouth daily as needed (constipation).     . predniSONE (DELTASONE) 10 MG tablet TAKE 1 TABLET (10 MG TOTAL) BY MOUTH DAILY WITH BREAKFAST. 30 tablet 1  . predniSONE (DELTASONE) 5 MG tablet TAKE 1 TABLET BY MOUTH EVERY DAY WITH BREAKFAST 30 tablet 3  . promethazine (PHENERGAN) 25 MG suppository Place 1 suppository (25 mg total) rectally every 6 (six) hours as needed for nausea or vomiting. 12 each 3  . tamsulosin (FLOMAX) 0.4 MG CAPS capsule Take 1 capsule (0.4 mg total) by mouth daily. 30 capsule 11  . warfarin (COUMADIN) 5 MG tablet Take 1 tablet by mouth daily as directed by coumadin clinic 90 tablet 0   No  facility-administered medications prior to visit.     Per HPI unless specifically indicated in ROS section below Review of Systems Objective:    BP 110/72 (BP Location: Left Arm, Patient Position: Sitting, Cuff Size: Normal)   Pulse 67   Temp (!) 97.5 F (36.4 C) (Temporal)   Ht 5' 9.5" (1.765 m)   Wt 145 lb 5 oz (65.9 kg)   SpO2 100%   BMI 21.15 kg/m   Wt Readings from Last 3 Encounters:  07/09/19 145 lb 5 oz (65.9 kg)  07/06/19 142 lb (64.4 kg)  06/19/19 142 lb (64.4 kg)    Physical Exam Vitals and nursing note reviewed.  Constitutional:      Appearance: Normal appearance. He is not ill-appearing.  Musculoskeletal:        General: Tenderness present. No swelling or deformity. Normal range of motion.     Right lower leg: No edema.     Left lower leg: No edema.     Comments:  No pain midline spine No paraspinous mm tenderness Tender with SLR bilaterally No groin pain with int/ext rotation at hip. No pain at SIJ, GTB or sciatic notch.  Reproducible tenderness R lower back just above R iliac crest  Skin:    Findings: Rash present.     Comments: Few fading erythematous papules on mid to right abdominal skin  Neurological:     Mental Status: He is alert.     Deep Tendon Reflexes:     Reflex Scores:      Patellar reflexes are 2+ on the right side and 2+ on the left side.    Comments:  5/5 strength BLE Sensation intact  Psychiatric:        Mood and Affect: Mood normal.        Behavior: Behavior normal.       Results for orders placed or performed in visit on 07/06/19  POCT INR  Result Value Ref Range   INR 4.1 (A) 2.0 - 3.0   Assessment & Plan:  This visit occurred during the SARS-CoV-2 public health emergency.  Safety protocols were in place, including screening questions prior to the visit, additional usage of staff PPE, and extensive cleaning of exam room while observing appropriate contact time as indicated for disinfecting solutions.   Problem List Items  Addressed This Visit    Low back pain radiating to right lower extremity - Primary    He describes radicular pain however no significant lumbar pain or radiculopathy noted. Pain most reproducible at R lower back just above iliac crest ?insertion of lat dorsi muscle into iliac crest. He is on levaquin rinse through ENT. ?tendinopathy at this area. Recommend stopping levaquin and monitoring for improvement. If no better, may try  small amount of diclofenac gel (OTC) to area and if no better, return for lumbar films.  Also recommended heating pad, gentle stretching, continue tylenol PRN.  Update with effect.       Long term current use of anticoagulant    Recent supratherapeutic INR likely from levaquin use - may improve with stopping this. Reviewed caution with topical voltaren use if needed.           No orders of the defined types were placed in this encounter.  No orders of the defined types were placed in this encounter.   Patient Instructions  I think you have lower back muscle strain  Try gentle stretching exercises provided today.  May use heating pad to the area to stimulate circulation.  Try off levaquin spray.  If persistent pain, may try voltaren gel over the counter, small amount to painful area twice daily.  If persistent pain, let us know for xrays.    Follow up plan: No follow-ups on file.  Ria Bush, MD

## 2019-07-09 NOTE — Assessment & Plan Note (Addendum)
He describes radicular pain however no significant lumbar pain or radiculopathy noted. Pain most reproducible at R lower back just above iliac crest ?insertion of lat dorsi muscle into iliac crest. He is on levaquin rinse through ENT. ?tendinopathy at this area. Recommend stopping levaquin and monitoring for improvement. If no better, may try small amount of diclofenac gel (OTC) to area and if no better, return for lumbar films.  Also recommended heating pad, gentle stretching, continue tylenol PRN.  Update with effect.

## 2019-07-16 ENCOUNTER — Other Ambulatory Visit: Payer: Self-pay | Admitting: Pulmonary Disease

## 2019-07-16 NOTE — Telephone Encounter (Signed)
Dr. Jenetta Downer, please advise if you are okay with [place] refilling med for pt.

## 2019-07-17 ENCOUNTER — Other Ambulatory Visit: Payer: Self-pay | Admitting: Cardiology

## 2019-07-20 ENCOUNTER — Other Ambulatory Visit: Payer: Self-pay

## 2019-07-20 ENCOUNTER — Ambulatory Visit (INDEPENDENT_AMBULATORY_CARE_PROVIDER_SITE_OTHER): Payer: Medicare HMO

## 2019-07-20 DIAGNOSIS — I4892 Unspecified atrial flutter: Secondary | ICD-10-CM

## 2019-07-20 DIAGNOSIS — Z5181 Encounter for therapeutic drug level monitoring: Secondary | ICD-10-CM | POA: Diagnosis not present

## 2019-07-20 DIAGNOSIS — Z7901 Long term (current) use of anticoagulants: Secondary | ICD-10-CM

## 2019-07-20 LAB — POCT INR: INR: 4.7 — AB (ref 2.0–3.0)

## 2019-07-20 NOTE — Patient Instructions (Signed)
-   have a serving of greens today -skip warfarin tonight and tomorrow - on Wednesdays, START NEW dosage of warfarin  of 1 tablet every day EXCEPT 1/2 TABLET ON MONDAYS, [place]. - recheck in 2 weeks.  BE CONSISTENT WITH YOUR GREENS INTAKE - pick a day or days to have your greens and do that every week.

## 2019-07-22 ENCOUNTER — Ambulatory Visit: Payer: Medicare HMO | Admitting: Cardiology

## 2019-07-31 DIAGNOSIS — L729 Follicular cyst of the skin and subcutaneous tissue, unspecified: Secondary | ICD-10-CM | POA: Diagnosis not present

## 2019-07-31 DIAGNOSIS — X32XXXA Exposure to sunlight, initial encounter: Secondary | ICD-10-CM | POA: Diagnosis not present

## 2019-07-31 DIAGNOSIS — Z85828 Personal history of other malignant neoplasm of skin: Secondary | ICD-10-CM | POA: Diagnosis not present

## 2019-07-31 DIAGNOSIS — L570 Actinic keratosis: Secondary | ICD-10-CM | POA: Diagnosis not present

## 2019-07-31 DIAGNOSIS — L821 Other seborrheic keratosis: Secondary | ICD-10-CM | POA: Diagnosis not present

## 2019-07-31 DIAGNOSIS — D2262 Melanocytic nevi of left upper limb, including shoulder: Secondary | ICD-10-CM | POA: Diagnosis not present

## 2019-07-31 DIAGNOSIS — D485 Neoplasm of uncertain behavior of skin: Secondary | ICD-10-CM | POA: Diagnosis not present

## 2019-07-31 DIAGNOSIS — D2261 Melanocytic nevi of right upper limb, including shoulder: Secondary | ICD-10-CM | POA: Diagnosis not present

## 2019-07-31 DIAGNOSIS — D225 Melanocytic nevi of trunk: Secondary | ICD-10-CM | POA: Diagnosis not present

## 2019-08-03 ENCOUNTER — Other Ambulatory Visit: Payer: Self-pay

## 2019-08-03 ENCOUNTER — Ambulatory Visit (INDEPENDENT_AMBULATORY_CARE_PROVIDER_SITE_OTHER): Payer: Medicare HMO

## 2019-08-03 DIAGNOSIS — I4892 Unspecified atrial flutter: Secondary | ICD-10-CM

## 2019-08-03 DIAGNOSIS — Z7901 Long term (current) use of anticoagulants: Secondary | ICD-10-CM | POA: Diagnosis not present

## 2019-08-03 DIAGNOSIS — Z5181 Encounter for therapeutic drug level monitoring: Secondary | ICD-10-CM | POA: Diagnosis not present

## 2019-08-03 LAB — POCT INR: INR: 2.8 (ref 2.0–3.0)

## 2019-08-03 NOTE — Patient Instructions (Signed)
-   continue dosage of warfarin  of 1 tablet every day EXCEPT 1/2 TABLET ON MONDAYS, [place]. - recheck in 3 weeks.

## 2019-08-18 DIAGNOSIS — G8929 Other chronic pain: Secondary | ICD-10-CM | POA: Diagnosis not present

## 2019-08-18 DIAGNOSIS — R151 Fecal smearing: Secondary | ICD-10-CM | POA: Diagnosis not present

## 2019-08-18 DIAGNOSIS — K5909 Other constipation: Secondary | ICD-10-CM | POA: Diagnosis not present

## 2019-08-18 DIAGNOSIS — R1011 Right upper quadrant pain: Secondary | ICD-10-CM | POA: Diagnosis not present

## 2019-08-18 DIAGNOSIS — K3184 Gastroparesis: Secondary | ICD-10-CM | POA: Diagnosis not present

## 2019-08-18 DIAGNOSIS — Z8719 Personal history of other diseases of the digestive system: Secondary | ICD-10-CM | POA: Diagnosis not present

## 2019-08-24 ENCOUNTER — Ambulatory Visit (INDEPENDENT_AMBULATORY_CARE_PROVIDER_SITE_OTHER): Payer: Medicare HMO

## 2019-08-24 ENCOUNTER — Other Ambulatory Visit: Payer: Self-pay

## 2019-08-24 DIAGNOSIS — Z5181 Encounter for therapeutic drug level monitoring: Secondary | ICD-10-CM

## 2019-08-24 DIAGNOSIS — Z7901 Long term (current) use of anticoagulants: Secondary | ICD-10-CM

## 2019-08-24 DIAGNOSIS — I4892 Unspecified atrial flutter: Secondary | ICD-10-CM

## 2019-08-24 LAB — POCT INR: INR: 3 (ref 2.0–3.0)

## 2019-08-24 NOTE — Patient Instructions (Signed)
-   continue dosage of warfarin  of 1 tablet every day EXCEPT 1/2 TABLET ON MONDAYS, [place]. - recheck in 4 weeks.

## 2019-08-25 DIAGNOSIS — M9903 Segmental and somatic dysfunction of lumbar region: Secondary | ICD-10-CM | POA: Diagnosis not present

## 2019-08-25 DIAGNOSIS — M9904 Segmental and somatic dysfunction of sacral region: Secondary | ICD-10-CM | POA: Diagnosis not present

## 2019-08-25 DIAGNOSIS — M545 Low back pain: Secondary | ICD-10-CM | POA: Diagnosis not present

## 2019-08-25 DIAGNOSIS — M461 Sacroiliitis, not elsewhere classified: Secondary | ICD-10-CM | POA: Diagnosis not present

## 2019-08-27 DIAGNOSIS — M545 Low back pain: Secondary | ICD-10-CM | POA: Diagnosis not present

## 2019-08-27 DIAGNOSIS — M9904 Segmental and somatic dysfunction of sacral region: Secondary | ICD-10-CM | POA: Diagnosis not present

## 2019-08-27 DIAGNOSIS — M9903 Segmental and somatic dysfunction of lumbar region: Secondary | ICD-10-CM | POA: Diagnosis not present

## 2019-08-27 DIAGNOSIS — M461 Sacroiliitis, not elsewhere classified: Secondary | ICD-10-CM | POA: Diagnosis not present

## 2019-08-28 DIAGNOSIS — M545 Low back pain: Secondary | ICD-10-CM | POA: Diagnosis not present

## 2019-08-28 DIAGNOSIS — M461 Sacroiliitis, not elsewhere classified: Secondary | ICD-10-CM | POA: Diagnosis not present

## 2019-08-28 DIAGNOSIS — M9904 Segmental and somatic dysfunction of sacral region: Secondary | ICD-10-CM | POA: Diagnosis not present

## 2019-08-28 DIAGNOSIS — M9903 Segmental and somatic dysfunction of lumbar region: Secondary | ICD-10-CM | POA: Diagnosis not present

## 2019-08-31 DIAGNOSIS — M9904 Segmental and somatic dysfunction of sacral region: Secondary | ICD-10-CM | POA: Diagnosis not present

## 2019-08-31 DIAGNOSIS — M545 Low back pain: Secondary | ICD-10-CM | POA: Diagnosis not present

## 2019-08-31 DIAGNOSIS — M461 Sacroiliitis, not elsewhere classified: Secondary | ICD-10-CM | POA: Diagnosis not present

## 2019-08-31 DIAGNOSIS — M9903 Segmental and somatic dysfunction of lumbar region: Secondary | ICD-10-CM | POA: Diagnosis not present

## 2019-09-02 ENCOUNTER — Telehealth: Payer: Self-pay

## 2019-09-02 NOTE — Telephone Encounter (Signed)
Patient is on antibiotics currently. Patient has taken 6 last night and will take 5 day. Patient would like advise on how to move forward with coumadin  Please advise

## 2019-09-02 NOTE — Telephone Encounter (Addendum)
Spoke w/ pt.  He reports that he is on Prednisone taper for back pain.  Advised him to take warfarin 1/2 tablet per day while on prednisone and moved his next INR check up to 09/09/19. I don't have any openings that day, so added him on at 9:00. He is appreciative and will call back w/ any further questions or concerns.

## 2019-09-09 ENCOUNTER — Ambulatory Visit (INDEPENDENT_AMBULATORY_CARE_PROVIDER_SITE_OTHER): Payer: Medicare HMO

## 2019-09-09 ENCOUNTER — Other Ambulatory Visit: Payer: Self-pay

## 2019-09-09 DIAGNOSIS — Z7901 Long term (current) use of anticoagulants: Secondary | ICD-10-CM

## 2019-09-09 DIAGNOSIS — Z5181 Encounter for therapeutic drug level monitoring: Secondary | ICD-10-CM | POA: Diagnosis not present

## 2019-09-09 DIAGNOSIS — I4892 Unspecified atrial flutter: Secondary | ICD-10-CM | POA: Diagnosis not present

## 2019-09-09 LAB — POCT INR: INR: 1.4 — AB (ref 2.0–3.0)

## 2019-09-09 NOTE — Patient Instructions (Signed)
-   take 1 whole tablet warfarin tonight, then  - continue dosage of warfarin  of 1 tablet every day EXCEPT 1/2 TABLET ON MONDAYS, [employer]. - recheck in 4 weeks.  BE CONSISTENT WITH YOUR GREENS INTAKE - pick a day or days to have your greens and do that every week.

## 2019-09-14 ENCOUNTER — Other Ambulatory Visit: Payer: Self-pay | Admitting: Pulmonary Disease

## 2019-09-14 NOTE — Telephone Encounter (Signed)
Spoke with patient to verify dose patient is taking of his prednisone.  He states he does better on a 10mg  tab, 5mg  in the am and 5mg  in the pm.  I told him I would send a message to Dr. Jenetta Downer and see what he wants to order.

## 2019-09-23 ENCOUNTER — Ambulatory Visit (INDEPENDENT_AMBULATORY_CARE_PROVIDER_SITE_OTHER): Payer: Medicare HMO | Admitting: *Deleted

## 2019-09-23 DIAGNOSIS — I5021 Acute systolic (congestive) heart failure: Secondary | ICD-10-CM | POA: Diagnosis not present

## 2019-09-23 DIAGNOSIS — I429 Cardiomyopathy, unspecified: Secondary | ICD-10-CM

## 2019-09-23 LAB — CUP PACEART REMOTE DEVICE CHECK
Battery Remaining Longevity: 19 mo
Battery Voltage: 2.91 V
Brady Statistic AP VP Percent: 0.01 %
Brady Statistic AP VS Percent: 0 %
Brady Statistic AS VP Percent: 91.4 %
Brady Statistic AS VS Percent: 8.59 %
Brady Statistic RA Percent Paced: 0.01 %
Brady Statistic RV Percent Paced: 90.88 %
Date Time Interrogation Session: 20210616001605
HighPow Impedance: 42 Ohm
HighPow Impedance: 52 Ohm
Implantable Lead Implant Date: 20070525
Implantable Lead Implant Date: 20070525
Implantable Lead Implant Date: 20070525
Implantable Lead Location: 753858
Implantable Lead Location: 753859
Implantable Lead Location: 753860
Implantable Lead Model: 4194
Implantable Lead Model: 5076
Implantable Lead Model: 6949
Implantable Pulse Generator Implant Date: 20170116
Lead Channel Impedance Value: 190 Ohm
Lead Channel Impedance Value: 342 Ohm
Lead Channel Impedance Value: 361 Ohm
Lead Channel Impedance Value: 418 Ohm
Lead Channel Impedance Value: 475 Ohm
Lead Channel Impedance Value: 475 Ohm
Lead Channel Pacing Threshold Amplitude: 0.625 V
Lead Channel Pacing Threshold Amplitude: 0.75 V
Lead Channel Pacing Threshold Pulse Width: 0.4 ms
Lead Channel Pacing Threshold Pulse Width: 0.4 ms
Lead Channel Sensing Intrinsic Amplitude: 13 mV
Lead Channel Sensing Intrinsic Amplitude: 13 mV
Lead Channel Sensing Intrinsic Amplitude: 3 mV
Lead Channel Sensing Intrinsic Amplitude: 3 mV
Lead Channel Setting Pacing Amplitude: 2 V
Lead Channel Setting Pacing Amplitude: 2 V
Lead Channel Setting Pacing Amplitude: 2 V
Lead Channel Setting Pacing Pulse Width: 0.4 ms
Lead Channel Setting Pacing Pulse Width: 0.4 ms
Lead Channel Setting Sensing Sensitivity: 0.45 mV

## 2019-09-23 NOTE — Progress Notes (Signed)
Cardiology Office Note   Date:  09/24/2019   ID:  JARET COPPEDGE, DOB 1931-10-13, MRN [id number]  PCP:  Ria Bush, MD  Cardiologist:   No primary care provider on file.   Chief Complaint  Patient presents with  . Leg Swelling      History of Present Illness: [name] is a 86 y.o. male who presents for follow up of his cardiomyopathy and atrial flutter.  EF on the last echo that I ordered in Jan was 20 - 25%.   He actually feels pretty good since I last saw him.  He is not had the leg swelling that he had previously.  He is working in his tomatoes and probably the first of the summer harvest.  He gets a little leg swelling at the end of the day but it goes away by the morning.  He does not notice his heart being irregular.  He is breathing okay.  He denies PND or orthopnea.  He had no chest pressure, neck or arm discomfort.  Weights have been steady.   Past Medical History:  Diagnosis Date  . AICD (automatic cardioverter/defibrillator) present    s/p gen change 04/2015 w/ MDT [name] Crt-D  DTBA1D1, Serial number [id number]  . Allergic rhinitis    [name]  . Anemia   . Anxiety   . Arthritis   . Atrial flutter ([place])    A.  Status post cardioversion; B.  Tikosyn therapy - failed, remains in aflutter  . Bilateral pneumonia 11/02/2013   treated with levaquin  . BPH (benign prostatic hyperplasia)   . 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 Lucky Cowboy)  . Chronic lung disease    spirometry 2015 - no obstruction, + mild restrictive lung disease  . Chronic sinusitis   . Chronic systolic congestive heart failure ([place])   . Extrinsic asthma    [name]  . Fall with injury 05/28/2017  . GERD (gastroesophageal reflux disease) 2010   h/o esophageal stricture with dilation, LA grade C reflux esophagitis by EGD 2010  . Goiter   . History of diverticulitis of colon   . History of GI bleed    Secondary to hemorrhoids  . Hypertension   . IBS  (irritable bowel syndrome)   . LBBB (left bundle branch block)   . Multiple pulmonary nodules 06/2013   RUL ([facility] Adam Phenix [facility]) - ?vasculitis as of last CT [facility]  . NICM (nonischemic cardiomyopathy) [facility])    Cardiac catheterization March 2006 without coronary disease; EF 25% 2018 Jan  . [place])   . Sinus congestion 09/25/2010    Past Surgical History:  Procedure Laterality Date  . BACK SURGERY  1997   bulging disks  . BiV-AICD implant     [employer]  . CARDIAC CATHETERIZATION  06/22/04   Severe, nonischemic cardiomyopathy,EF 25-30%  . CARDIOVERSION  02/22/09   AFlutter-([facility])  . CHOLECYSTECTOMY    . COLONOSCOPY  10/99   Divertic, splenic, hepatic fleure only  . COLONOSCOPY  10/21/08   aborted-divertics, int hemms (Dr. Vira Agar)  . CORONARY ANGIOPLASTY    . EP IMPLANTABLE DEVICE N/A 04/25/2015   Procedure: BIV ICD Generator Changeout;  Surgeon: Evans Lance, MD;  Location: [facility];  Service: Cardiovascular;  Laterality: N/A;  . ESOPHAGEAL DILATION  02/18/98  . ESOPHAGOGASTRODUODENOSCOPY  01/1998   stricture, sliding HH, GERD  . ESOPHAGOGASTRODUODENOSCOPY  04/08/01   stricture,  gastritis, HH, GERD-no dilation(Dr. Henrene Pastor)  . ESOPHAGOGASTRODUODENOSCOPY  12/22/04   stricture; gastritis; duodenitis, GERD  . ESOPHAGOGASTRODUODENOSCOPY  10/21/08   Reflux esophagitis; Erythem. Duod. (Dr. Vira Agar)  . ESOPHAGOGASTRODUODENOSCOPY  08/2013   erythema gastric fundus - minimal gastritis, HH ([place])  . ESOPHAGOGASTRODUODENOSCOPY  08/2016   dysphagia - mod schatzki rin, dilated Tiffany Kocher)  . ESOPHAGOGASTRODUODENOSCOPY (EGD) WITH PROPOFOL N/A 08/24/2016   mod schatzki ring dilated, duodenal deformity Vira Agar, Gavin Pound, MD)  . goiter removal    . HERNIA REPAIR    . HERNIA REPAIR  01/30/02   Dr. Hassell Done  . INSERT / REPLACE / REMOVE PACEMAKER    . NOSE SURGERY  1971  . PENILE PROSTHESIS IMPLANT  2007   [place]  . PFT  10/2013   FVC 61%, FEV1 63%, ratio 0.76  .  Pulmonary eval  04/2002   ([facility]) Chronic congestive symptoms  . REVERSE SHOULDER ARTHROPLASTY Right 01/17/2016   Procedure: REVERSE SHOULDER ARTHROPLASTY;  Surgeon: Corky Mull, MD;  Location: [facility];  Service: Orthopedics;  Laterality: Right;  . REVERSE TOTAL SHOULDER ARTHROPLASTY Right 01/2016   [place]  . US ECHOCARDIOGRAPHY  07/13/04   EF 25-30%, Mod LVH; LA severe dilation; Mild AR; IRTR  . US ECHOCARDIOGRAPHY  11/27/06   hypokinesis posterior wall, EF 35%; mild AR     Current Outpatient Medications  Medication Sig Dispense Refill  . acetaminophen (TYLENOL) 500 MG tablet Take 250 mg by mouth daily as needed. Pain    . ALPRAZolam (XANAX) 0.25 MG tablet TAKE 1 TABLET (0.25 MG TOTAL) BY MOUTH AT BEDTIME.* INSURANCE ONLY COVERS 30 DAYS 30 tablet 0  . alum & mag hydroxide-simeth (MAALOX/MYLANTA) 200-200-20 MG/5ML suspension Take 15 mLs by mouth daily as needed for indigestion or heartburn.    . budesonide-formoterol (SYMBICORT) 160-4.5 MCG/ACT inhaler Inhale 2 puffs into the lungs at bedtime.    . carvedilol (COREG) 6.25 MG tablet TAKE 1 AND 1/2 TABLETS BY MOUTH TWICE DAILY 270 tablet 1  . fluticasone (FLONASE) 50 MCG/ACT nasal spray Place 1 spray into both nostrils daily.    . furosemide (LASIX) 40 MG tablet TAKE 1 TABLET BY MOUTH EVERY DAY 90 tablet 3  . guaiFENesin 200 MG tablet Take 400 mg by mouth every 4 (four) hours as needed for cough or to loosen phlegm.    . levalbuterol (XOPENEX HFA) 45 MCG/ACT inhaler Inhale 1-2 puffs into the lungs every 6 (six) hours as needed for wheezing or shortness of breath. 1 Inhaler 1  . levofloxacin (LEVAQUIN) 25 MG/ML solution Take by mouth daily.    . Loratadine 10 MG CAPS Take 1 capsule by mouth daily as needed (allergies).     . montelukast (SINGULAIR) 10 MG tablet Take 10 mg by mouth daily as needed (BREATHING).     . polyethylene glycol (MIRALAX / GLYCOLAX) packet Take 17 g by mouth daily as needed (constipation).     . predniSONE (DELTASONE) 5 MG  tablet 6 pills x1day, 5x1,4x1,3x1,2x1,1x1    . promethazine (PHENERGAN) 25 MG suppository Place 1 suppository (25 mg total) rectally every 6 (six) hours as needed for nausea or vomiting. 12 each 3  . tamsulosin (FLOMAX) 0.4 MG CAPS capsule Take 1 capsule (0.4 mg total) by mouth daily. 30 capsule 11  . warfarin (COUMADIN) 5 MG tablet Take 1 tablet by mouth daily as directed by coumadin clinic 90 tablet 0   No current facility-administered medications for this visit.    Allergies:   Carafate [sucralfate], Clarithromycin, Famotidine, Levaquin [  levofloxacin], Omeprazole, Oxytetracycline, Penicillins, Proton pump inhibitors, Ranitidine, and Doxycycline   ROS:  Please see the history of present illness.   Otherwise, review of systems are positive for none.   All other systems are reviewed and negative.    PHYSICAL EXAM: VS:  BP 120/74   Pulse (!) 55   Temp 97.8 F (36.6 C)   Ht 5\' 6"  (1.676 m)   Wt 142 lb 12.8 oz (64.8 kg)   SpO2 96%   BMI 23.05 kg/m  , BMI Body mass index is 23.05 kg/m. GENERAL:  Well appearing NECK:  No jugular venous distention, waveform within normal limits, carotid upstroke brisk and symmetric, no bruits, no thyromegaly LUNGS:  Clear to auscultation bilaterally CHEST: Well-healed device pocket HEART:  PMI not displaced or sustained,S1 and S2 within normal limits, no S3, no clicks, no rubs, no murmurs, irregular ABD:  Flat, positive bowel sounds normal in frequency in pitch, no bruits, no rebound, no guarding, no midline pulsatile mass, no hepatomegaly, no splenomegaly EXT:  2 plus pulses throughout, mild edema, no cyanosis no clubbing   EKG:  EKG is ordered today. Atrial flutter with variable conduction with ventricular demand pacing   Recent Labs: 11/06/2018: ALT 12; Hemoglobin 13.1; Platelets 116.0; TSH 1.96 06/19/2019: BUN 25; Creatinine, Ser 1.36; Potassium 4.7; Sodium 137    Lipid Panel    Component Value Date/Time   CHOL 183 11/06/2018 1206   CHOL 115  09/01/2013 0507   TRIG 130.0 11/06/2018 1206   TRIG 120 09/01/2013 0507   HDL 49.50 11/06/2018 1206   CHOLHDL 4 11/06/2018 1206   VLDL 26.0 11/06/2018 1206   LDLCALC 107 (H) 11/06/2018 1206      Wt Readings from Last 3 Encounters:  09/24/19 142 lb 12.8 oz (64.8 kg)  07/09/19 145 lb 5 oz (65.9 kg)  07/06/19 142 lb (64.4 kg)      Other studies Reviewed: Additional studies/ records that were reviewed today include: Device interrogation. Review of the above records demonstrates:  Please see elsewhere in the note.     ASSESSMENT AND PLAN:  Chronic systolic HF -  He wants conservative therapy.  He has not tolerated med titration.  He is watching his salt.  He is watching his fluid.  I will continue with therapy as listed.   Atrial flutter -   [name] has a CHA2DS2 - VASc score of 3.  He tolerates anticoagulation.  No change in therapy.  I will check a CBC.  Pulmonary HTN - He has no significant symptoms related to this.  We will go to manage this conservatively.  ICD: He is going to call to schedule an office follow-up.  He actually had interrogation yesterday.  I reviewed this.  His impedance was unchanged.  He is predominantly she had flutter.  He has normal device function.  Covid education - Again we talked about the need to get vaccinated and he will consider this.  He is resistant.  Current medicines are reviewed at length with the patient today.  The patient does not have concerns regarding medicines.  The following changes have been made:  no change  Labs/ tests ordered today include:   Orders Placed This Encounter  Procedures  . CBC  . EKG 12-Lead     Disposition:   FU with me in 3 months.     Signed, [name], MD  09/24/2019 10:58 AM    [facility]

## 2019-09-24 ENCOUNTER — Telehealth: Payer: Self-pay | Admitting: Cardiology

## 2019-09-24 ENCOUNTER — Ambulatory Visit: Payer: Medicare HMO | Admitting: Cardiology

## 2019-09-24 ENCOUNTER — Other Ambulatory Visit: Payer: Self-pay | Admitting: Family Medicine

## 2019-09-24 ENCOUNTER — Encounter: Payer: Self-pay | Admitting: Cardiology

## 2019-09-24 ENCOUNTER — Other Ambulatory Visit: Payer: Self-pay

## 2019-09-24 VITALS — BP 120/74 | HR 55 | Temp 97.8°F | Ht 66.0 in | Wt 142.8 lb

## 2019-09-24 DIAGNOSIS — I272 Pulmonary hypertension, unspecified: Secondary | ICD-10-CM | POA: Diagnosis not present

## 2019-09-24 DIAGNOSIS — Z7189 Other specified counseling: Secondary | ICD-10-CM

## 2019-09-24 DIAGNOSIS — I4892 Unspecified atrial flutter: Secondary | ICD-10-CM | POA: Diagnosis not present

## 2019-09-24 DIAGNOSIS — I5022 Chronic systolic (congestive) heart failure: Secondary | ICD-10-CM | POA: Diagnosis not present

## 2019-09-24 NOTE — Telephone Encounter (Signed)
Wife aware OK to come to appt today

## 2019-09-24 NOTE — Patient Instructions (Signed)
Medication Instructions:  NO CHANGES *If you need a refill on your cardiac medications before your next appointment, please call your pharmacy*  Lab Work: Your physician recommends that you return for lab work TODAY (CBC) If you have labs (blood work) drawn today and your tests are completely normal, you will receive your results only by: Marland Kitchen MyChart Message (if you have MyChart) OR . A paper copy in the mail If you have any lab test that is abnormal or we need to change your treatment, we will call you to review the results.  Testing/Procedures: NONE ORDERED THIS VISIT  Follow-Up: At [facility], you and your health needs are our priority.  As part of our continuing mission to provide you with exceptional heart care, we have created designated Provider Care Teams.  These Care Teams include your primary Cardiologist (physician) and Advanced Practice Providers (APPs -  Physician Assistants and Nurse Practitioners) who all work together to provide you with the care you need, when you need it.  Your next appointment:   6 month(s)  You will receive a reminder letter in the mail two months in advance. If you don't receive a letter, please call our office to schedule the follow-up appointment.   The format for your next appointment:   In Person  Provider:   [name], MD

## 2019-09-24 NOTE — Telephone Encounter (Signed)
Last filled 05-20-19 #30 Last OV 07-09-19 Next OV 11-16-19 [facility]

## 2019-09-24 NOTE — Progress Notes (Signed)
Remote ICD transmission.   

## 2019-09-24 NOTE — Telephone Encounter (Signed)
Patient's wife is requesting to come with the patient to his appointment today at 10:20am. She states that he cannot hear that well.

## 2019-09-25 ENCOUNTER — Other Ambulatory Visit: Payer: Self-pay | Admitting: Cardiology

## 2019-09-25 LAB — CBC
Hematocrit: 40.8 % (ref 37.5–51.0)
Hemoglobin: 14.2 g/dL (ref 13.0–17.7)
MCH: 31.6 pg (ref 26.6–33.0)
MCHC: 34.8 g/dL (ref 31.5–35.7)
MCV: 91 fL (ref 79–97)
Platelets: 139 10*3/uL — ABNORMAL LOW (ref 150–450)
RBC: 4.49 x10E6/uL (ref 4.14–5.80)
RDW: 14.9 % (ref 11.6–15.4)
WBC: 7.6 10*3/uL (ref 3.4–10.8)

## 2019-09-25 NOTE — Telephone Encounter (Signed)
ERx 

## 2019-10-07 ENCOUNTER — Other Ambulatory Visit: Payer: Self-pay

## 2019-10-07 ENCOUNTER — Ambulatory Visit (INDEPENDENT_AMBULATORY_CARE_PROVIDER_SITE_OTHER): Payer: Medicare HMO

## 2019-10-07 DIAGNOSIS — I4892 Unspecified atrial flutter: Secondary | ICD-10-CM

## 2019-10-07 DIAGNOSIS — Z5181 Encounter for therapeutic drug level monitoring: Secondary | ICD-10-CM | POA: Diagnosis not present

## 2019-10-07 DIAGNOSIS — I5021 Acute systolic (congestive) heart failure: Secondary | ICD-10-CM

## 2019-10-07 DIAGNOSIS — R9389 Abnormal findings on diagnostic imaging of other specified body structures: Secondary | ICD-10-CM

## 2019-10-07 DIAGNOSIS — Z7901 Long term (current) use of anticoagulants: Secondary | ICD-10-CM

## 2019-10-07 DIAGNOSIS — Z7189 Other specified counseling: Secondary | ICD-10-CM

## 2019-10-07 LAB — POCT INR: INR: 1.7 — AB (ref 2.0–3.0)

## 2019-10-07 NOTE — Patient Instructions (Signed)
-   START NEW DOSAGE of warfarin of 1 tablet every day EXCEPT 1/2 TABLET ON MONDAYS. - recheck in 3 weeks.  BE CONSISTENT WITH YOUR GREENS INTAKE - pick a day or days to have your greens and do that every week.

## 2019-10-26 ENCOUNTER — Ambulatory Visit (INDEPENDENT_AMBULATORY_CARE_PROVIDER_SITE_OTHER): Payer: Medicare HMO

## 2019-10-26 ENCOUNTER — Other Ambulatory Visit: Payer: Self-pay | Admitting: Urology

## 2019-10-26 ENCOUNTER — Other Ambulatory Visit: Payer: Self-pay

## 2019-10-26 DIAGNOSIS — I4892 Unspecified atrial flutter: Secondary | ICD-10-CM

## 2019-10-26 DIAGNOSIS — Z7901 Long term (current) use of anticoagulants: Secondary | ICD-10-CM

## 2019-10-26 DIAGNOSIS — Z5181 Encounter for therapeutic drug level monitoring: Secondary | ICD-10-CM | POA: Diagnosis not present

## 2019-10-26 LAB — POCT INR: INR: 2.4 (ref 2.0–3.0)

## 2019-10-26 NOTE — Patient Instructions (Signed)
-   continue dosage of warfarin of 1 tablet every day EXCEPT 1/2 TABLET ON MONDAYS. - recheck in 4 weeks.  BE CONSISTENT WITH YOUR GREENS INTAKE - pick a day or days to have your greens and do that every week.

## 2019-11-04 ENCOUNTER — Other Ambulatory Visit: Payer: Self-pay | Admitting: Cardiology

## 2019-11-12 ENCOUNTER — Other Ambulatory Visit: Payer: Self-pay

## 2019-11-12 ENCOUNTER — Ambulatory Visit: Payer: Medicare HMO

## 2019-11-15 ENCOUNTER — Other Ambulatory Visit: Payer: Self-pay | Admitting: Pulmonary Disease

## 2019-11-16 ENCOUNTER — Ambulatory Visit (INDEPENDENT_AMBULATORY_CARE_PROVIDER_SITE_OTHER): Payer: Medicare HMO | Admitting: Family Medicine

## 2019-11-16 ENCOUNTER — Other Ambulatory Visit: Payer: Self-pay

## 2019-11-16 ENCOUNTER — Encounter: Payer: Self-pay | Admitting: Family Medicine

## 2019-11-16 VITALS — BP 120/80 | HR 64 | Temp 97.6°F | Ht 64.5 in | Wt 140.2 lb

## 2019-11-16 DIAGNOSIS — D696 Thrombocytopenia, unspecified: Secondary | ICD-10-CM

## 2019-11-16 DIAGNOSIS — K219 Gastro-esophageal reflux disease without esophagitis: Secondary | ICD-10-CM

## 2019-11-16 DIAGNOSIS — I5022 Chronic systolic (congestive) heart failure: Secondary | ICD-10-CM

## 2019-11-16 DIAGNOSIS — Z0000 Encounter for general adult medical examination without abnormal findings: Secondary | ICD-10-CM

## 2019-11-16 DIAGNOSIS — F419 Anxiety disorder, unspecified: Secondary | ICD-10-CM

## 2019-11-16 DIAGNOSIS — R600 Localized edema: Secondary | ICD-10-CM

## 2019-11-16 DIAGNOSIS — R739 Hyperglycemia, unspecified: Secondary | ICD-10-CM

## 2019-11-16 DIAGNOSIS — Z7901 Long term (current) use of anticoagulants: Secondary | ICD-10-CM

## 2019-11-16 DIAGNOSIS — J984 Other disorders of lung: Secondary | ICD-10-CM

## 2019-11-16 DIAGNOSIS — Z95810 Presence of automatic (implantable) cardiac defibrillator: Secondary | ICD-10-CM

## 2019-11-16 DIAGNOSIS — E538 Deficiency of other specified B group vitamins: Secondary | ICD-10-CM

## 2019-11-16 DIAGNOSIS — J988 Other specified respiratory disorders: Secondary | ICD-10-CM

## 2019-11-16 DIAGNOSIS — I429 Cardiomyopathy, unspecified: Secondary | ICD-10-CM

## 2019-11-16 DIAGNOSIS — H547 Unspecified visual loss: Secondary | ICD-10-CM

## 2019-11-16 MED ORDER — ALPRAZOLAM 0.25 MG PO TABS: ORAL_TABLET | ORAL | 0 refills | Status: DC

## 2019-11-16 MED ORDER — CYANOCOBALAMIN 500 MCG PO TABS: 1000.0000 ug | ORAL_TABLET | Freq: Every day | ORAL | Status: AC

## 2019-11-16 NOTE — Assessment & Plan Note (Signed)
Chronic prednisone through pulm.

## 2019-11-16 NOTE — Assessment & Plan Note (Signed)
Update A1c ?

## 2019-11-16 NOTE — Assessment & Plan Note (Signed)
Chronic, stable period.  

## 2019-11-16 NOTE — Assessment & Plan Note (Signed)
Intolerant to PPI - continues maalox PRN.

## 2019-11-16 NOTE — Assessment & Plan Note (Signed)
Sees pulm, stable period on low dose prednisone.

## 2019-11-16 NOTE — Progress Notes (Signed)
This visit was conducted in person.  BP 120/80 (BP Location: Left Arm, Patient Position: Sitting, Cuff Size: Normal)   Pulse 64   Temp 97.6 F (36.4 C) (Temporal)   Ht 5' 4.5" (1.638 m)   Wt 140 lb 3 oz (63.6 kg)   SpO2 98%   BMI 23.69 kg/m    CC: AMW Subjective:    Patient ID: [name], male    DOB: Jan 29, 1932, 86 y.o.   MRN: [id number]  HPI: [name] is a 86 y.o. male presenting on 11/16/2019 for Medicare Wellness and Foot Swelling (C/o foot swelling.  Started about 1 mo ago. )   Did not see health advisor this year.    Hearing Screening   125Hz  250Hz  500Hz  1000Hz  2000Hz  3000Hz  4000Hz  6000Hz  8000Hz   Right ear:   40 40 0  0    Left ear:   40 0 0  0    Comments: Wears bilateral hearing aids.  Not wearing at today's visit.    Visual Acuity Screening   Right eye Left eye Both eyes  Without correction:     With correction: 20/100 20/50 20/50  Comments: Next eye appt, 11/18/19.  States he needs cataract surgery.  Ongoing hearing loss despite using hearing aides - has never been formally tested. Significant noise exposure.    Office Visit from 11/16/2019 in [place] [facility] [place]  PHQ-2 Total Score 0      Fall Risk  11/16/2019 11/07/2018 07/02/2017 06/29/2016 04/12/2015  Falls in the past year? 0 0 Yes No No  Comment - - tripped and fell while shopping - -  Number falls in past yr: - - 1 - -  Injury with Fall? - - Yes - -  Risk for fall due to : - Medication side effect - - -  Follow up - Falls evaluation completed;Falls prevention discussed - - -    Foot swelling noted 1 month ago - despite avoiding salt in diet and lasix 40mg  daily. R>L.   Preventative: Colonoscopy - 2010 [facility]) aborted early 2/2 significant diverticulosis. Aged out Prostate cancer - aged out Flu shot - declines Pneumococcal vaccine - declines Td 2011. Tdap 05/2017 COVID vaccine - declines  zostavax 2008 Shingrix - discussed  Advanced directive: Wouldn't want  prolonged life support. Wife would be medical decision maker. Has defibrillator in place. On [place] registry card - file # [id number]. Seat belt use discussed.  Sunscreen use discussed. No changing moles on skin.Sees dermatology Non smoker  Alcohol - none  Dentist - has dentures  Eye exam every few years - upcoming appt Wed.  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 Interim medical history since our last visit reviewed. Allergies and medications reviewed and updated. Outpatient Medications Prior to Visit  Medication Sig Dispense Refill  . acetaminophen (TYLENOL) 500 MG tablet Take 250 mg by mouth daily as needed. Pain    . alum & mag hydroxide-simeth (MAALOX/MYLANTA) 200-200-20 MG/5ML suspension Take 15 mLs by mouth daily as needed for indigestion or heartburn.    . budesonide-formoterol (SYMBICORT) 160-4.5 MCG/ACT inhaler Inhale 2 puffs into the lungs at bedtime.    . carvedilol (COREG)  6.25 MG tablet TAKE 1 AND 1/2 TABLETS BY MOUTH TWICE DAILY 270 tablet 1  . fluticasone (FLONASE) 50 MCG/ACT nasal spray Place 1 spray into both nostrils daily.    . furosemide (LASIX) 40 MG tablet TAKE 1 TABLET BY MOUTH EVERY DAY 90 tablet 3  . guaiFENesin 200 MG tablet Take 400 mg by mouth every 4 (four) hours as needed for cough or to loosen phlegm.    . levalbuterol (XOPENEX HFA) 45 MCG/ACT inhaler Inhale 1-2 puffs into the lungs every 6 (six) hours as needed for wheezing or shortness of breath. 1 Inhaler 1  . levofloxacin (LEVAQUIN) 25 MG/ML solution Take by mouth daily.    . Loratadine 10 MG CAPS Take 1 capsule by mouth daily as needed (allergies).     .  montelukast (SINGULAIR) 10 MG tablet Take 10 mg by mouth daily as needed (BREATHING).     . polyethylene glycol (MIRALAX / GLYCOLAX) packet Take 17 g by mouth daily as needed (constipation).     . predniSONE (DELTASONE) 5 MG tablet 6 pills x1day, 5x1,4x1,3x1,2x1,1x1    . promethazine (PHENERGAN) 25 MG suppository Place 1 suppository (25 mg total) rectally every 6 (six) hours as needed for nausea or vomiting. 12 each 3  . tamsulosin (FLOMAX) 0.4 MG CAPS capsule TAKE 1 CAPSULE BY MOUTH EVERY DAY 90 capsule 2  . warfarin (COUMADIN) 5 MG tablet TAKE 1/2 TO 1 TABLET BY MOUTH DAILY AS DIRECTED BY COUMADIN CLINIC 90 tablet 0  . ALPRAZolam (XANAX) 0.25 MG tablet TAKE 1 TABLET (0.25 MG TOTAL) BY MOUTH AT BEDTIME.* INSURANCE ONLY COVERS 30 DAYS 30 tablet 0   No facility-administered medications prior to visit.     Per HPI unless specifically indicated in ROS section below Review of Systems  Constitutional: Negative for activity change, appetite change, chills, fatigue, fever and unexpected weight change.  HENT: Negative for hearing loss.   Eyes: Negative for visual disturbance.  Respiratory: Positive for cough and shortness of breath. Negative for chest tightness and wheezing.   Cardiovascular: Positive for leg swelling. Negative for chest pain and palpitations.  Gastrointestinal: Negative for abdominal distention, abdominal pain, blood in stool, constipation, diarrhea, nausea and vomiting.  Genitourinary: Negative for difficulty urinating and hematuria.  Musculoskeletal: Negative for arthralgias, myalgias and neck pain.  Skin: Negative for rash.  Neurological: Negative for dizziness, seizures, syncope and headaches.       More unsteady  Hematological: Negative for adenopathy. Bruises/bleeds easily.  Psychiatric/Behavioral: Negative for dysphoric mood. The patient is not nervous/anxious.    Objective:  BP 120/80 (BP Location: Left Arm, Patient Position: Sitting, Cuff Size: Normal)   Pulse 64    Temp 97.6 F (36.4 C) (Temporal)   Ht 5' 4.5" (1.638 m)   Wt 140 lb 3 oz (63.6 kg)   SpO2 98%   BMI 23.69 kg/m   Wt Readings from Last 3 Encounters:  11/16/19 140 lb 3 oz (63.6 kg)  09/24/19 142 lb 12.8 oz (64.8 kg)  07/09/19 145 lb 5 oz (65.9 kg)      Physical Exam Vitals and nursing note reviewed.  Constitutional:      General: He is not in acute distress.    Appearance: Normal appearance. He is well-developed. He is not ill-appearing.  HENT:     Head: Normocephalic and atraumatic.     Right Ear: Hearing, tympanic membrane, ear canal and external ear normal.     Left Ear: Hearing, tympanic membrane, ear canal and external ear normal.  Eyes:     General: No scleral icterus.    Extraocular Movements: Extraocular movements intact.     Conjunctiva/sclera: Conjunctivae normal.     Comments: Cataracts bilaterally  Cardiovascular:     Rate and Rhythm: Normal rate and regular rhythm.     Pulses: Normal pulses.          Radial pulses are 2+ on the right side and 2+ on the left side.     Heart sounds: Murmur (3/6 systolic) heard.   Pulmonary:     Effort: Pulmonary effort is normal. No respiratory distress.     Breath sounds: Normal breath sounds. No wheezing, rhonchi or rales.  Abdominal:     General: Abdomen is flat. Bowel sounds are normal. There is no distension.     Palpations: Abdomen is soft. There is no mass.     Tenderness: There is no abdominal tenderness. There is no guarding or rebound.     Hernia: No hernia is present.  Musculoskeletal:        General: Normal range of motion.     Cervical back: Normal range of motion and neck supple.     Right lower leg: Edema (1+) present.     Left lower leg: Edema (tr) present.     Comments: 2+ DP bilaterally  Lymphadenopathy:     Cervical: No cervical adenopathy.  Skin:    General: Skin is warm and dry.     Findings: No rash.     Comments: Reticular veins to bilateral dorsal feet  Neurological:     General: No focal  deficit present.     Mental Status: He is alert and oriented to person, place, and time.     Comments:  CN grossly intact, station and gait intact Recall 3/3 Calculation 2/5 DLO-   Psychiatric:        Mood and Affect: Mood normal.        Behavior: Behavior normal.        Thought Content: Thought content normal.        Judgment: Judgment normal.       Results for orders placed or performed in visit on 10/26/19  POCT INR  Result Value Ref Range   INR 2.4 2.0 - 3.0   Assessment & Plan:  This visit occurred during the SARS-CoV-2 public health emergency.  Safety protocols were in place, including screening questions prior to the visit, additional usage of staff PPE, and extensive cleaning of exam room while observing appropriate contact time as indicated for disinfecting solutions.   Problem List Items Addressed This Visit    Vitamin B12 deficiency    Compliant with PO B12 149mcg daily - will update levels.       Relevant Orders   Vitamin B12   Thrombocytopenia (HCC)    Chronic. ?ITP. Update today.       Relevant Orders   CBC with Differential/Platelet   Secondary cardiomyopathy ([place])    Sees cards regularly - appreciate cards care.       Pedal edema    Anticipate CVI related (evidence of this on exam) - already avoids salt/sodium in diet. rec trial knee high 20-51mmHg compression stockings, Rx provided today.       Medicare annual wellness visit, subsequent - Primary    I have personally reviewed the Medicare Annual Wellness questionnaire and have noted 1. The patient's medical and social history 2. Their use of alcohol, tobacco or illicit drugs 3. Their current medications and supplements 4.  The patient's functional ability including ADL's, fall risks, home safety risks and hearing or visual impairment. Cognitive function has been assessed and addressed as indicated.  5. Diet and physical activity 6. Evidence for depression or mood disorders The patients weight,  height, BMI have been recorded in the chart. I have made referrals, counseling and provided education to the patient based on review of the above and I have provided the pt with a written personalized care plan for preventive services. Provider list updated.. See scanned questionairre as needed for further documentation. Reviewed preventative protocols and updated unless pt declined.       Long term current use of anticoagulant   Hyperglycemia    Update A1c.      Relevant Orders   Hemoglobin A1c   Health maintenance examination    Preventative protocols reviewed and updated unless pt declined. Discussed healthy diet and lifestyle.       Relevant Orders   Comprehensive metabolic panel   GERD    Intolerant to PPI - continues maalox PRN.       Decreased visual acuity    R>L - already has eye exam schedule later this week.       Congestion of respiratory tract    Chronic prednisone through pulm.       Chronic systolic heart failure (HCC)    Chronic, stable period.       Chronic lung disease    Sees pulm, stable period on low dose prednisone.       Automatic implantable cardioverter-defibrillator in situ   Anxiety    Rare PRN xanax - refilled.      Relevant Medications   ALPRAZolam (XANAX) 0.25 MG tablet       Meds ordered this encounter  Medications  . ALPRAZolam (XANAX) 0.25 MG tablet    Sig: TAKE 1 TABLET (0.25 MG TOTAL) BY MOUTH AT BEDTIME    Dispense:  30 tablet    Refill:  0    Not to exceed 5 additional fills before 11/16/2019  . vitamin B-12 (CYANOCOBALAMIN) 500 MCG tablet    Sig: Take 2 tablets (1,000 mcg total) by mouth daily.   Orders Placed This Encounter  Procedures  . Comprehensive metabolic panel  . CBC with Differential/Platelet  . Vitamin B12  . Hemoglobin A1c    Patient instructions: Try compression stockings for legs - this may help swelling noted. Put on in the morning take off at bedtime.  Labs today You are doing well  today Return as needed or in 6 months for follow up visit.   Follow up plan: Return in about 6 months (around 05/18/2020), or if symptoms worsen or fail to improve, for follow up visit.  Ria Bush, MD

## 2019-11-16 NOTE — Assessment & Plan Note (Signed)
Rare PRN xanax - refilled.

## 2019-11-16 NOTE — Assessment & Plan Note (Signed)
R>L - already has eye exam schedule later this week.

## 2019-11-16 NOTE — Patient Instructions (Addendum)
Try compression stockings for legs - this may help swelling noted. Put on in the morning take off at bedtime.  Labs today You are doing well today Return as needed or in 6 months for follow up visit.   Health Maintenance After Age 85 After age 76, you are at a higher risk for certain long-term diseases and infections as well as injuries from falls. Falls are a major cause of broken bones and head injuries in people who are older than age 66. Getting regular preventive care can help to keep you healthy and well. Preventive care includes getting regular testing and making lifestyle changes as recommended by your health care provider. Talk with your health care provider about:  Which screenings and tests you should have. A screening is a test that checks for a disease when you have no symptoms.  A diet and exercise plan that is right for you. What should I know about screenings and tests to prevent falls? Screening and testing are the best ways to find a health problem early. Early diagnosis and treatment give you the best chance of managing medical conditions that are common after age 60. Certain conditions and lifestyle choices may make you more likely to have a fall. Your health care provider may recommend:  Regular vision checks. Poor vision and conditions such as cataracts can make you more likely to have a fall. If you wear glasses, make sure to get your prescription updated if your vision changes.  Medicine review. Work with your health care provider to regularly review all of the medicines you are taking, including over-the-counter medicines. Ask your health care provider about any side effects that may make you more likely to have a fall. Tell your health care provider if any medicines that you take make you feel dizzy or sleepy.  Osteoporosis screening. Osteoporosis is a condition that causes the bones to get weaker. This can make the bones weak and cause them to break more easily.  Blood  pressure screening. Blood pressure changes and medicines to control blood pressure can make you feel dizzy.  Strength and balance checks. Your health care provider may recommend certain tests to check your strength and balance while standing, walking, or changing positions.  Foot health exam. Foot pain and numbness, as well as not wearing proper footwear, can make you more likely to have a fall.  Depression screening. You may be more likely to have a fall if you have a fear of falling, feel emotionally low, or feel unable to do activities that you used to do.  Alcohol use screening. Using too much alcohol can affect your balance and may make you more likely to have a fall. What actions can I take to lower my risk of falls? General instructions  Talk with your health care provider about your risks for falling. Tell your health care provider if: ? You fall. Be sure to tell your health care provider about all falls, even ones that seem minor. ? You feel dizzy, sleepy, or off-balance.  Take over-the-counter and prescription medicines only as told by your health care provider. These include any supplements.  Eat a healthy diet and maintain a healthy weight. A healthy diet includes low-fat dairy products, low-fat (lean) meats, and fiber from whole grains, beans, and lots of fruits and vegetables. Home safety  Remove any tripping hazards, such as rugs, cords, and clutter.  Install safety equipment such as grab bars in bathrooms and safety rails on stairs.  Keep rooms  and walkways well-lit. Activity   Follow a regular exercise program to stay fit. This will help you maintain your balance. Ask your health care provider what types of exercise are appropriate for you.  If you need a cane or walker, use it as recommended by your health care provider.  Wear supportive shoes that have nonskid soles. Lifestyle  Do not drink alcohol if your health care provider tells you not to drink.  If you  drink alcohol, limit how much you have: ? 0-1 drink a day for women. ? 0-2 drinks a day for men.  Be aware of how much alcohol is in your drink. In the [place] one drink equals one typical bottle of beer (12 oz), one-half glass of wine (5 oz), or one shot of hard liquor (1 oz).  Do not use any products that contain nicotine or tobacco, such as cigarettes and e-cigarettes. If you need help quitting, ask your health care provider. Summary  Having a healthy lifestyle and getting preventive care can help to protect your health and wellness after age 59.  Screening and testing are the best way to find a health problem early and help you avoid having a fall. Early diagnosis and treatment give you the best chance for managing medical conditions that are more common for people who are older than age 49.  Falls are a major cause of broken bones and head injuries in people who are older than age 40. Take precautions to prevent a fall at home.  Work with your health care provider to learn what changes you can make to improve your health and wellness and to prevent falls. This information is not intended to replace advice given to you by your health care provider. Make sure you discuss any questions you have with your health care provider. Document Revised: 07/17/2018 Document Reviewed: 02/06/2017 Elsevier Patient Education  2020 [employer].

## 2019-11-16 NOTE — Assessment & Plan Note (Addendum)
Compliant with PO B12 125mcg daily - will update levels.

## 2019-11-16 NOTE — Assessment & Plan Note (Signed)
Anticipate CVI related (evidence of this on exam) - already avoids salt/sodium in diet. rec trial knee high 20-50mmHg compression stockings, Rx provided today.

## 2019-11-16 NOTE — Assessment & Plan Note (Addendum)
Chronic. ?ITP. Update today.

## 2019-11-16 NOTE — Assessment & Plan Note (Signed)
Sees cards regularly - appreciate cards care.

## 2019-11-16 NOTE — Assessment & Plan Note (Signed)

## 2019-11-16 NOTE — Assessment & Plan Note (Signed)
Preventative protocols reviewed and updated unless pt declined. Discussed healthy diet and lifestyle.  

## 2019-11-17 LAB — CBC WITH DIFFERENTIAL/PLATELET
Basophils Absolute: 0.1 10*3/uL (ref 0.0–0.1)
Basophils Relative: 1.2 % (ref 0.0–3.0)
Eosinophils Absolute: 0 10*3/uL (ref 0.0–0.7)
Eosinophils Relative: 0.6 % (ref 0.0–5.0)
HCT: 42.3 % (ref 39.0–52.0)
Hemoglobin: 14.3 g/dL (ref 13.0–17.0)
Lymphocytes Relative: 20.8 % (ref 12.0–46.0)
Lymphs Abs: 1.8 10*3/uL (ref 0.7–4.0)
MCHC: 33.8 g/dL (ref 30.0–36.0)
MCV: 94.6 fl (ref 78.0–100.0)
Monocytes Absolute: 0.5 10*3/uL (ref 0.1–1.0)
Monocytes Relative: 6.4 % (ref 3.0–12.0)
Neutro Abs: 6 10*3/uL (ref 1.4–7.7)
Neutrophils Relative %: 71 % (ref 43.0–77.0)
Platelets: 136 10*3/uL — ABNORMAL LOW (ref 150.0–400.0)
RBC: 4.47 Mil/uL (ref 4.22–5.81)
RDW: 17.1 % — ABNORMAL HIGH (ref 11.5–15.5)
WBC: 8.5 10*3/uL (ref 4.0–10.5)

## 2019-11-17 LAB — COMPREHENSIVE METABOLIC PANEL
ALT: 14 U/L (ref 0–53)
AST: 17 U/L (ref 0–37)
Albumin: 4.2 g/dL (ref 3.5–5.2)
Alkaline Phosphatase: 54 U/L (ref 39–117)
BUN: 26 mg/dL — ABNORMAL HIGH (ref 6–23)
CO2: 32 mEq/L (ref 19–32)
Calcium: 9.3 mg/dL (ref 8.4–10.5)
Chloride: 98 mEq/L (ref 96–112)
Creatinine, Ser: 1.41 mg/dL (ref 0.40–1.50)
GFR: 47.64 mL/min — ABNORMAL LOW (ref >60.00)
Glucose, Bld: 98 mg/dL (ref 70–99)
Potassium: 5.1 mEq/L (ref 3.5–5.1)
Sodium: 138 mEq/L (ref 135–145)
Total Bilirubin: 1.1 mg/dL (ref 0.2–1.2)
Total Protein: 6.9 g/dL (ref 6.0–8.3)

## 2019-11-17 LAB — HEMOGLOBIN A1C: Hgb A1c MFr Bld: 6.1 % (ref 4.6–6.5)

## 2019-11-17 LAB — VITAMIN B12: Vitamin B-12: 829 pg/mL (ref 211–911)

## 2019-11-18 DIAGNOSIS — H2513 Age-related nuclear cataract, bilateral: Secondary | ICD-10-CM | POA: Diagnosis not present

## 2019-11-22 ENCOUNTER — Encounter: Payer: Self-pay | Admitting: Family Medicine

## 2019-11-22 ENCOUNTER — Other Ambulatory Visit: Payer: Self-pay | Admitting: Cardiology

## 2019-11-22 DIAGNOSIS — R7303 Prediabetes: Secondary | ICD-10-CM | POA: Insufficient documentation

## 2019-11-23 ENCOUNTER — Other Ambulatory Visit: Payer: Self-pay

## 2019-11-23 ENCOUNTER — Ambulatory Visit: Payer: Medicare HMO

## 2019-11-23 DIAGNOSIS — Z5181 Encounter for therapeutic drug level monitoring: Secondary | ICD-10-CM | POA: Diagnosis not present

## 2019-11-23 DIAGNOSIS — Z7901 Long term (current) use of anticoagulants: Secondary | ICD-10-CM | POA: Diagnosis not present

## 2019-11-23 DIAGNOSIS — I4892 Unspecified atrial flutter: Secondary | ICD-10-CM | POA: Diagnosis not present

## 2019-11-23 LAB — POCT INR: INR: 2.3 (ref 2.0–3.0)

## 2019-11-23 NOTE — Patient Instructions (Signed)
-   continue dosage of warfarin of 1 tablet every day EXCEPT 1/2 TABLET ON MONDAYS. - recheck in 5 weeks.  BE CONSISTENT WITH YOUR GREENS INTAKE - pick a day or days to have your greens and do that every week.

## 2019-12-04 DIAGNOSIS — H2511 Age-related nuclear cataract, right eye: Secondary | ICD-10-CM | POA: Diagnosis not present

## 2019-12-04 DIAGNOSIS — I509 Heart failure, unspecified: Secondary | ICD-10-CM | POA: Diagnosis not present

## 2019-12-08 ENCOUNTER — Other Ambulatory Visit: Payer: Self-pay | Admitting: Pulmonary Disease

## 2019-12-09 ENCOUNTER — Other Ambulatory Visit: Payer: Self-pay | Admitting: Pulmonary Disease

## 2019-12-09 ENCOUNTER — Encounter: Payer: Medicare HMO | Admitting: Internal Medicine

## 2019-12-10 ENCOUNTER — Telehealth: Payer: Self-pay | Admitting: Pulmonary Disease

## 2019-12-10 ENCOUNTER — Encounter: Payer: Self-pay | Admitting: Ophthalmology

## 2019-12-10 ENCOUNTER — Other Ambulatory Visit: Payer: Self-pay

## 2019-12-10 ENCOUNTER — Other Ambulatory Visit: Payer: Self-pay | Admitting: Pulmonary Disease

## 2019-12-10 MED ORDER — PREDNISONE 5 MG PO TABS
5.0000 mg | ORAL_TABLET | Freq: Two times a day (BID) | ORAL | 3 refills | Status: DC
Start: 2019-12-10 — End: 2020-05-20

## 2019-12-10 MED ORDER — PREDNISONE 5 MG PO TABS
5.0000 mg | ORAL_TABLET | Freq: Two times a day (BID) | ORAL | 0 refills | Status: DC
Start: 2019-12-10 — End: 2019-12-10

## 2019-12-10 NOTE — Telephone Encounter (Signed)
Prednisone refill sent in

## 2019-12-10 NOTE — Telephone Encounter (Signed)
Spoke with son [name] (dpr on file), requesting a prednisone refill to last until next OV.  Pt is taking 5mg  bid.  This has been sent to pharmacy as requested.  Nothing further needed at this time- will close encounter.

## 2019-12-10 NOTE — Progress Notes (Signed)
Prednisone refills sent in

## 2019-12-10 NOTE — Telephone Encounter (Signed)
Dr.  Jenetta Downer, please see mychart message from pt's son Octavia Bruckner and advise.

## 2019-12-11 ENCOUNTER — Ambulatory Visit: Payer: Medicare HMO | Admitting: Pulmonary Disease

## 2019-12-11 NOTE — Anesthesia Preprocedure Evaluation (Addendum)
Anesthesia Evaluation  Patient identified by MRN, date of birth, ID band Patient awake    Reviewed: Allergy & Precautions, H&P , NPO status , Patient's Chart, lab work & pertinent test results  Airway Mallampati: II  TM Distance: >3 FB Neck ROM: full    Dental no notable dental hx.    Pulmonary shortness of breath, asthma ,    Pulmonary exam normal        Cardiovascular hypertension, +CHF (NICM EF 20-25%, AICD in place)  Normal cardiovascular exam+ dysrhythmias (chronic a-flutter, on [facility]) + pacemaker + Cardiac Defibrillator  Rhythm:regular Rate:Normal  ACID in place for low EF- has never discharged.  2019 Echo  Study Conclusions   - Left ventricle: The cavity size was moderately dilated. Wall  thickness was normal. Systolic function was severely reduced. The  estimated ejection fraction was in the range of 20% to 25%.  Akinesis of the lateral, inferolateral, and inferior myocardium.  Features are consistent with a pseudonormal left ventricular  filling pattern, with concomitant abnormal relaxation and  increased filling pressure (grade 2 diastolic dysfunction).  - Aortic valve: There was trivial regurgitation.  - Mitral valve: Calcified annulus. There was moderate  regurgitation.  - Left atrium: The atrium was severely dilated.  - Right atrium: The atrium was mildly dilated.  - Pulmonary arteries: Systolic pressure was severely increased. PA  peak pressure: 70 mm Hg (S).  - Pericardium, extracardiac: A trivial pericardial effusion was  identified.   Neuro/Psych    GI/Hepatic Neg liver ROS, Medicated,  Endo/Other  negative endocrine ROS  Renal/GU negative Renal ROS  negative genitourinary   Musculoskeletal   Abdominal   Peds  Hematology  (+) Blood dyscrasia, anemia , Chronic thrombocytopenia, most recent platelets 136   Anesthesia Other Findings   Reproductive/Obstetrics                             Anesthesia Physical Anesthesia Plan  ASA: III  Anesthesia Plan: MAC   Post-op Pain Management:    Induction:   PONV Risk Score and Plan:   Airway Management Planned:   Additional Equipment:   Intra-op Plan:   Post-operative Plan:   Informed Consent: I have reviewed the patients History and Physical, chart, labs and discussed the procedure including the risks, benefits and alternatives for the proposed anesthesia with the patient or authorized representative who has indicated his/her understanding and acceptance.       Plan Discussed with:   Anesthesia Plan Comments:         Anesthesia Quick Evaluation

## 2019-12-16 ENCOUNTER — Other Ambulatory Visit: Payer: Self-pay

## 2019-12-16 ENCOUNTER — Ambulatory Visit: Payer: Medicare HMO

## 2019-12-16 DIAGNOSIS — Z5181 Encounter for therapeutic drug level monitoring: Secondary | ICD-10-CM

## 2019-12-16 DIAGNOSIS — I4892 Unspecified atrial flutter: Secondary | ICD-10-CM

## 2019-12-16 DIAGNOSIS — Z7901 Long term (current) use of anticoagulants: Secondary | ICD-10-CM | POA: Diagnosis not present

## 2019-12-16 LAB — POCT INR: INR: 3 (ref 2.0–3.0)

## 2019-12-16 NOTE — Patient Instructions (Signed)
-   Have a serving of greens today - continue dosage of warfarin of 1 tablet every day EXCEPT 1/2 TABLET ON MONDAYS. - recheck in 6 weeks.  BE CONSISTENT WITH YOUR GREENS INTAKE - pick a day or days to have your greens and do that every week.

## 2019-12-17 ENCOUNTER — Other Ambulatory Visit
Admission: RE | Admit: 2019-12-17 | Discharge: 2019-12-17 | Disposition: A | Discharge status: Home / Self Care | Payer: Medicare HMO | Source: Ambulatory Visit | Attending: Ophthalmology | Admitting: Ophthalmology

## 2019-12-17 DIAGNOSIS — Z01812 Encounter for preprocedural laboratory examination: Secondary | ICD-10-CM | POA: Diagnosis not present

## 2019-12-17 DIAGNOSIS — Z20822 Contact with and (suspected) exposure to covid-19: Secondary | ICD-10-CM | POA: Diagnosis not present

## 2019-12-17 NOTE — Discharge Instructions (Signed)

## 2019-12-18 ENCOUNTER — Encounter: Payer: Self-pay | Admitting: Primary Care

## 2019-12-18 ENCOUNTER — Other Ambulatory Visit: Payer: Self-pay

## 2019-12-18 ENCOUNTER — Ambulatory Visit: Payer: Medicare HMO | Admitting: Primary Care

## 2019-12-18 DIAGNOSIS — J984 Other disorders of lung: Secondary | ICD-10-CM | POA: Diagnosis not present

## 2019-12-18 LAB — SARS CORONAVIRUS 2 (TAT 6-24 HRS): SARS Coronavirus 2: NEGATIVE

## 2019-12-18 NOTE — Progress Notes (Signed)
@Patient  ID: [name], male    DOB: 1931/08/16, 86 y.o.   MRN: [id number]  Chief Complaint  Patient presents with  . Follow-up    Pt states he has been doing good since last visit and denies any complaints.    Referring provider: Ria Bush, MD  HPI: 86 year old male, never smoked.  Past medical history significant for chronic lung disease, bronchitis, allergic rhinitis, pulmonary hypertension secondary to cardiomyopathy, a flutter, chronic systolic heart failure, aortic valve regurgitation, left bundle branch block, GERD, esophageal stricture, anxiety.  Patient of Dr. [name], last seen for virtual video visit on 01/27/2019. Spirometry 2015 showed no obstruction, mild restrictive lung disease. Maintained on 5 mg prednisone daily.  Patient starts to feel poorly he may increase prednisone to 7.5mg  daily. Recommended follow-up in 3 months.   12/18/2019 - Interim hx Patient presents today for regular office visit. Accompanied by his son. He was last seen for virtual visit 1 year ago. Patient called office on 12/10/19 requesting refill of prednisone. He has been taking 5mg  twice daily. He is currently doing well. No acute complaints. No recent hospitalizations. He uses Symbicort 160 twice daily. No SABA use. He has productive cough in morning only. He has attempted to decrease prednisone several times without success. Patient reports increased shortness of breath and cough when on lower dose. He is having right eye cataract surgery Monday. He has not been vaccinated for COVID, his son has been vaccinated.    Immunization History  Administered Date(s) Administered  . Td 11/08/1995, 02/16/2010  . Tdap 05/24/2017  . Zoster 05/31/2006    Past Medical History:  Diagnosis Date  . AICD (automatic cardioverter/defibrillator) present    s/p gen change 04/2015 w/ MDT [name] Crt-D  [id number], Serial number [id number]  . Allergic rhinitis    [name]  . Anemia   . Anxiety   .  Arthritis   . Atrial flutter ([place])    A.  Status post cardioversion; B.  Tikosyn therapy - failed, remains in aflutter  . Bilateral pneumonia 11/02/2013   treated with levaquin  . BPH (benign prostatic hyperplasia)   . 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 Lucky Cowboy)  . Chronic lung disease    spirometry 2015 - no obstruction, + mild restrictive lung disease  . Chronic sinusitis   . Chronic systolic congestive heart failure ([place])   . Dyspnea    with exertion  . Extrinsic asthma    [name]  . Fall with injury 05/28/2017  . GERD (gastroesophageal reflux disease) 2010   h/o esophageal stricture with dilation, LA grade C reflux esophagitis by EGD 2010  . Goiter   . History of diverticulitis of colon   . History of GI bleed    Secondary to hemorrhoids  . History of seasonal allergies   . Hypertension   . IBS (irritable bowel syndrome)   . LBBB (left bundle branch block)   . Multiple pulmonary nodules 06/2013   RUL (Dr. Adam Phenix at [facility]) - ?vasculitis as of last CT at [facility]  . NICM (nonischemic cardiomyopathy) [facility])    Cardiac catheterization March 2006 without coronary disease; EF 25% 2018 Jan  . Orthopnea   . 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 Presence of permanent cardiac pacemaker   . Sinus congestion 09/25/2010    Tobacco History: Social History   Tobacco Use  Smoking Status Never Smoker  Smokeless Tobacco Never Used   Counseling given: Not  Answered   Outpatient Medications Prior to Visit  Medication Sig Dispense Refill  . acetaminophen (TYLENOL) 500 MG tablet Take 250 mg by mouth daily as needed. Pain    . ALPRAZolam (XANAX) 0.25 MG tablet TAKE 1 TABLET (0.25 MG TOTAL) BY MOUTH AT BEDTIME 30 tablet 0  . alum & mag hydroxide-simeth (MAALOX/MYLANTA) 200-200-20 MG/5ML suspension Take 15 mLs by mouth daily as needed for indigestion or heartburn.    . budesonide-formoterol (SYMBICORT) 160-4.5 MCG/ACT inhaler Inhale 2 puffs into the lungs at bedtime.    .  carvedilol (COREG) 6.25 MG tablet TAKE 1 AND 1/2 TABLETS BY MOUTH TWICE DAILY 270 tablet 1  . fluticasone (FLONASE) 50 MCG/ACT nasal spray Place 1 spray into both nostrils daily.    . furosemide (LASIX) 40 MG tablet TAKE 1 TABLET BY MOUTH EVERY DAY (Patient taking differently: 20 mg 2 (two) times daily. ) 90 tablet 3  . guaiFENesin 200 MG tablet Take 400 mg by mouth every 4 (four) hours as needed for cough or to loosen phlegm.    . levalbuterol (XOPENEX HFA) 45 MCG/ACT inhaler Inhale 1-2 puffs into the lungs every 6 (six) hours as needed for wheezing or shortness of breath. 1 Inhaler 1  . Loratadine 10 MG CAPS Take 1 capsule by mouth daily as needed (allergies).     . montelukast (SINGULAIR) 10 MG tablet Take 10 mg by mouth daily as needed (BREATHING).     . polyethylene glycol (MIRALAX / GLYCOLAX) packet Take 17 g by mouth daily as needed (constipation).     . predniSONE (DELTASONE) 5 MG tablet Take 1 tablet (5 mg total) by mouth 2 (two) times daily with a meal. 60 tablet 3  . tamsulosin (FLOMAX) 0.4 MG CAPS capsule TAKE 1 CAPSULE BY MOUTH EVERY DAY 90 capsule 2  . vitamin B-12 (CYANOCOBALAMIN) 500 MCG tablet Take 2 tablets (1,000 mcg total) by mouth daily.    Marland Kitchen warfarin (COUMADIN) 5 MG tablet TAKE 1/2 TO 1 TABLET BY MOUTH DAILY AS DIRECTED BY COUMADIN CLINIC 90 tablet 0  . levofloxacin (LEVAQUIN) 25 MG/ML solution Take by mouth daily. (Patient not taking: Reported on 12/10/2019)    . promethazine (PHENERGAN) 25 MG suppository Place 1 suppository (25 mg total) rectally every 6 (six) hours as needed for nausea or vomiting. 12 each 3   No facility-administered medications prior to visit.   Review of Systems  Review of Systems  Constitutional: Negative.   Respiratory: Positive for cough and shortness of breath. Negative for wheezing.   Cardiovascular: Negative.    Physical Exam  BP 124/80 (BP Location: Left Arm, Cuff Size: Normal)   Pulse (!) 58   Temp (!) 96.5 F (35.8 C) (Other (Comment))  Comment (Src): wrist  Ht 5\' 5"  (1.651 m)   Wt 140 lb (63.5 kg)   SpO2 99%   BMI 23.30 kg/m  Physical Exam Constitutional:      Appearance: Normal appearance.  HENT:     Head: Normocephalic and atraumatic.     Mouth/Throat:     Mouth: Mucous membranes are moist.     Pharynx: Oropharynx is clear. No oropharyngeal exudate or posterior oropharyngeal erythema.  Cardiovascular:     Rate and Rhythm: Normal rate. Rhythm irregular.  Pulmonary:     Effort: Pulmonary effort is normal.     Breath sounds: Normal breath sounds. No wheezing or rhonchi.  Skin:    General: Skin is warm and dry.     Comments: Thin skin, scattered bruising  Neurological:     General: No focal deficit present.     Mental Status: He is alert and oriented to person, place, and time. Mental status is at baseline.  Psychiatric:        Mood and Affect: Mood normal.        Behavior: Behavior normal.        Thought Content: Thought content normal.        Judgment: Judgment normal.      Lab Results:  CBC    Component Value Date/Time   WBC 8.5 11/16/2019 1558   RBC 4.47 11/16/2019 1558   HGB 14.3 11/16/2019 1558   HGB 14.2 09/24/2019 1047   HCT 42.3 11/16/2019 1558   HCT 40.8 09/24/2019 1047   PLT 136.0 (L) 11/16/2019 1558   PLT 139 (L) 09/24/2019 1047   MCV 94.6 11/16/2019 1558   MCV 91 09/24/2019 1047   MCV 84 08/22/2013 0545   MCH 31.6 09/24/2019 1047   MCH 30.4 01/18/2016 0355   MCHC 33.8 11/16/2019 1558   RDW 17.1 (H) 11/16/2019 1558   RDW 14.9 09/24/2019 1047   RDW 15.7 (H) 08/22/2013 0545   LYMPHSABS 1.8 11/16/2019 1558   LYMPHSABS 1.2 08/22/2013 0545   MONOABS 0.5 11/16/2019 1558   MONOABS 0.4 08/22/2013 0545   EOSABS 0.0 11/16/2019 1558   EOSABS 0.2 08/22/2013 0545   BASOSABS 0.1 11/16/2019 1558   BASOSABS 0.1 08/22/2013 0545    BMET    Component Value Date/Time   NA 138 11/16/2019 1558   NA 137 06/19/2019 1017   NA 134 (L) 09/01/2013 0507   K 5.1 11/16/2019 1558   K 4.0  09/01/2013 0507   CL 98 11/16/2019 1558   CL 99 09/01/2013 0507   CO2 32 11/16/2019 1558   CO2 29 09/01/2013 0507   GLUCOSE 98 11/16/2019 1558   GLUCOSE 133 (H) 09/01/2013 0507   BUN 26 (H) 11/16/2019 1558   BUN 25 06/19/2019 1017   BUN 28 (H) 09/01/2013 0507   CREATININE 1.41 11/16/2019 1558   CREATININE 1.04 05/04/2016 1203   CALCIUM 9.3 11/16/2019 1558   CALCIUM 8.3 (L) 09/01/2013 0507   GFRNONAA 47 (L) 06/19/2019 1017   GFRNONAA >60 09/01/2013 0507   GFRAA 54 (L) 06/19/2019 1017   GFRAA >60 09/01/2013 0507    BNP    Component Value Date/Time   BNP 774.6 (H) 06/17/2017 0942   BNP 538.0 (H) 04/04/2016 1209    ProBNP No results found for: PROBNP  Imaging: No results found.   Assessment & Plan:   Chronic lung disease - Doing well, no recent exacerbations/hospitalizations - Maintained on prednisone 5mg  twice daily. Attempts to decrease steroids were unsuccessful. Continue Symbicort 160 two puffs twice daily - Encourage you consider getting covid-19 vaccine; discussed benefits for vaccination at length. He will consider but unlikely that he wil get vaccine.      Martyn Ehrich, NP 12/18/2019

## 2019-12-18 NOTE — Assessment & Plan Note (Addendum)
-   Doing well, no recent exacerbations/hospitalizations - Maintained on prednisone 5mg  twice daily. Attempts to decrease steroids were unsuccessful. Continue Symbicort 160 two puffs twice daily - Encourage you consider getting covid-19 vaccine; discussed benefits for vaccination at length. He will consider but unlikely that he wil get vaccine.

## 2019-12-18 NOTE — Patient Instructions (Addendum)
Pleasure meeting you today [name], glad you are doing well  Recommendations:  - Continue Symbicort two puffs twice daily - Continue prednisone 5mg  twice daily - Encourage you consider getting covid-19 vaccine - If you get acutely ill, you should get tested for covid and call PCP or our office within 10 days of symptoms onset   Follow-up: - 6 months with Dr. [name]

## 2019-12-21 ENCOUNTER — Encounter: Payer: Self-pay | Admitting: Ophthalmology

## 2019-12-21 ENCOUNTER — Ambulatory Visit: Payer: Medicare HMO | Admitting: Anesthesiology

## 2019-12-21 ENCOUNTER — Ambulatory Visit
Admission: RE | Admit: 2019-12-21 | Discharge: 2019-12-21 | Disposition: A | Discharge status: Home / Self Care | Payer: Medicare HMO | Source: Ambulatory Visit | Attending: Ophthalmology | Admitting: Ophthalmology

## 2019-12-21 ENCOUNTER — Encounter
Admission: RE | Disposition: A | Discharge status: Home / Self Care | Payer: Self-pay | Source: Ambulatory Visit | Attending: Ophthalmology

## 2019-12-21 DIAGNOSIS — R69 Illness, unspecified: Secondary | ICD-10-CM | POA: Diagnosis not present

## 2019-12-21 DIAGNOSIS — J45909 Unspecified asthma, uncomplicated: Secondary | ICD-10-CM | POA: Insufficient documentation

## 2019-12-21 DIAGNOSIS — Z7951 Long term (current) use of inhaled steroids: Secondary | ICD-10-CM | POA: Insufficient documentation

## 2019-12-21 DIAGNOSIS — F419 Anxiety disorder, unspecified: Secondary | ICD-10-CM | POA: Insufficient documentation

## 2019-12-21 DIAGNOSIS — Z95810 Presence of automatic (implantable) cardiac defibrillator: Secondary | ICD-10-CM | POA: Diagnosis not present

## 2019-12-21 DIAGNOSIS — H25811 Combined forms of age-related cataract, right eye: Secondary | ICD-10-CM | POA: Diagnosis not present

## 2019-12-21 DIAGNOSIS — I509 Heart failure, unspecified: Secondary | ICD-10-CM | POA: Insufficient documentation

## 2019-12-21 DIAGNOSIS — Z7952 Long term (current) use of systemic steroids: Secondary | ICD-10-CM | POA: Diagnosis not present

## 2019-12-21 DIAGNOSIS — H2181 Floppy iris syndrome: Secondary | ICD-10-CM | POA: Diagnosis not present

## 2019-12-21 DIAGNOSIS — Z96611 Presence of right artificial shoulder joint: Secondary | ICD-10-CM | POA: Diagnosis not present

## 2019-12-21 DIAGNOSIS — Z7901 Long term (current) use of anticoagulants: Secondary | ICD-10-CM | POA: Diagnosis not present

## 2019-12-21 DIAGNOSIS — Z79899 Other long term (current) drug therapy: Secondary | ICD-10-CM | POA: Diagnosis not present

## 2019-12-21 DIAGNOSIS — H2511 Age-related nuclear cataract, right eye: Secondary | ICD-10-CM | POA: Insufficient documentation

## 2019-12-21 DIAGNOSIS — I110 Hypertensive heart disease with heart failure: Secondary | ICD-10-CM | POA: Diagnosis not present

## 2019-12-21 HISTORY — DX: Orthopnea: R06.01

## 2019-12-21 HISTORY — PX: CATARACT EXTRACTION W/PHACO: SHX586

## 2019-12-21 HISTORY — DX: Allergy status to unspecified drugs, medicaments and biological substances: Z88.9

## 2019-12-21 HISTORY — DX: Dyspnea, unspecified: R06.00

## 2019-12-21 SURGERY — PHACOEMULSIFICATION, CATARACT, WITH IOL INSERTION
Anesthesia: Monitor Anesthesia Care | Site: Eye | Laterality: Right

## 2019-12-21 MED ORDER — LIDOCAINE HCL (PF) 2 % IJ SOLN
INTRAOCULAR | Status: DC | PRN
  Administered 2019-12-21: 1 mL via INTRAOCULAR

## 2019-12-21 MED ORDER — TETRACAINE HCL 0.5 % OP SOLN
1.0000 [drp] | OPHTHALMIC | Status: DC | PRN
  Administered 2019-12-21 (×3): 1 [drp] via OPHTHALMIC

## 2019-12-21 MED ORDER — EPINEPHRINE PF 1 MG/ML IJ SOLN
INTRAOCULAR | Status: DC | PRN
  Administered 2019-12-21: 68 mL via OPHTHALMIC

## 2019-12-21 MED ORDER — FENTANYL CITRATE (PF) 100 MCG/2ML IJ SOLN
INTRAMUSCULAR | Status: DC | PRN
Start: 2019-12-21 — End: 2019-12-21
  Administered 2019-12-21 (×2): 25 ug via INTRAVENOUS
  Administered 2019-12-21: 50 ug via INTRAVENOUS

## 2019-12-21 MED ORDER — ARMC OPHTHALMIC DILATING DROPS
1.0000 "application " | OPHTHALMIC | Status: DC | PRN
  Administered 2019-12-21 (×3): 1 via OPHTHALMIC

## 2019-12-21 MED ORDER — SODIUM HYALURONATE 23 MG/ML IO SOLN
INTRAOCULAR | Status: DC | PRN
  Administered 2019-12-21: 0.6 mL via INTRAOCULAR

## 2019-12-21 MED ORDER — LACTATED RINGERS IV SOLN: INTRAVENOUS | Status: DC

## 2019-12-21 MED ORDER — POLYMYXIN B-TRIMETHOPRIM 10000-0.1 UNIT/ML-% OP SOLN
OPHTHALMIC | Status: DC | PRN
  Administered 2019-12-21: 1 [drp] via OPHTHALMIC

## 2019-12-21 MED ORDER — SODIUM HYALURONATE 10 MG/ML IO SOLN
INTRAOCULAR | Status: DC | PRN
  Administered 2019-12-21: 0.55 mL via INTRAOCULAR

## 2019-12-21 SURGICAL SUPPLY — 17 items
CANNULA ANT/CHMB 27GA (MISCELLANEOUS) ×4 IMPLANT
DISSECTOR HYDRO NUCLEUS 50X22 (MISCELLANEOUS) ×2 IMPLANT
GLOVE SURG LX 7.5 STRW (GLOVE) ×1
GLOVE SURG LX STRL 7.5 STRW (GLOVE) ×1 IMPLANT
GLOVE SURG SYN 8.5  E (GLOVE) ×2
GLOVE SURG SYN 8.5 E (GLOVE) ×1 IMPLANT
GOWN STRL REUS W/ TWL LRG LVL3 (GOWN DISPOSABLE) ×2 IMPLANT
GOWN STRL REUS W/TWL LRG LVL3 (GOWN DISPOSABLE) ×4
LENS IOL TECNIS EYHANCE 22.0 ×2 IMPLANT
MARKER SKIN DUAL TIP RULER LAB (MISCELLANEOUS) ×2 IMPLANT
PACK DR. KING ARMS (PACKS) ×2 IMPLANT
PACK EYE AFTER SURG (MISCELLANEOUS) ×2 IMPLANT
PACK OPTHALMIC (MISCELLANEOUS) ×2 IMPLANT
SYR 3ML LL SCALE MARK (SYRINGE) ×2 IMPLANT
SYR TB 1ML LUER SLIP (SYRINGE) ×2 IMPLANT
WATER STERILE IRR 250ML POUR (IV SOLUTION) ×2 IMPLANT
WIPE NON LINTING 3.25X3.25 (MISCELLANEOUS) ×2 IMPLANT

## 2019-12-21 NOTE — Transfer of Care (Signed)
Immediate Anesthesia Transfer of Care Note  Patient: [name]  Procedure(s) Performed: CATARACT EXTRACTION PHACO AND INTRAOCULAR LENS PLACEMENT (IOC) RIGHT 2.23  00:21.9 (Right Eye)  Patient Location: PACU  Anesthesia Type: MAC  Level of Consciousness: awake, alert  and patient cooperative  Airway and Oxygen Therapy: Patient Spontanous Breathing and Patient connected to supplemental oxygen  Post-op Assessment: Post-op Vital signs reviewed, Patient's Cardiovascular Status Stable, Respiratory Function Stable, Patent Airway and No signs of Nausea or vomiting  Post-op Vital Signs: Reviewed and stable  Complications: No complications documented.

## 2019-12-21 NOTE — H&P (Signed)

## 2019-12-21 NOTE — Anesthesia Postprocedure Evaluation (Signed)
Anesthesia Post Note  Patient: [name]  Procedure(s) Performed: CATARACT EXTRACTION PHACO AND INTRAOCULAR LENS PLACEMENT (IOC) RIGHT 2.23  00:21.9 (Right Eye)     Patient location during evaluation: PACU Anesthesia Type: MAC Level of consciousness: awake and alert Pain management: pain level controlled Vital Signs Assessment: post-procedure vital signs reviewed and stable Respiratory status: spontaneous breathing Cardiovascular status: stable Postop Assessment: no headache Anesthetic complications: no   No complications documented.  Gillian Scarce

## 2019-12-21 NOTE — Op Note (Signed)
OPERATIVE NOTE  [name] [id number] 12/21/2019   PREOPERATIVE DIAGNOSIS:  Nuclear sclerotic cataract right eye.  H25.11   POSTOPERATIVE DIAGNOSIS:     1.  Nuclear sclerotic cataract right eye.   2.  Intraoperative Floppy Iris Syndrome.   PROCEDURE:  Phacoemusification with posterior chamber intraocular lens placement of the right eye   LENS:   Implant Name Type Inv. Item Serial No. Manufacturer Lot No. LRB No. Used Action  LENS II EYHANCE 22.0 - [id number]  LENS II EYHANCE 22.0 [id number] JOHNSON   Right 1 Implanted       Procedure(s): CATARACT EXTRACTION PHACO AND INTRAOCULAR LENS PLACEMENT (IOC) RIGHT 2.23  00:21.9 (Right)  DIB00 +22.0   ULTRASOUND TIME: 0 minutes 21 seconds.  CDE 2.23   SURGEON:  Benay Pillow, MD, MPH  ANESTHESIOLOGIST: Anesthesiologist: Elgie Collard, MD CRNA: Jeannene Patella, CRNA   ANESTHESIA:  Topical with tetracaine drops augmented with 1% preservative-free intracameral lidocaine.  ESTIMATED BLOOD LOSS: less than 1 mL.   COMPLICATIONS:  None.   DESCRIPTION OF PROCEDURE:  The patient was identified in the holding room and transported to the operating room and placed in the supine position under the operating microscope.  The right eye was identified as the operative eye and it was prepped and draped in the usual sterile ophthalmic fashion.   A 1.0 millimeter clear-corneal paracentesis was made at the 10:30 position. 0.5 ml of preservative-free 1% lidocaine with epinephrine was injected into the anterior chamber.  The anterior chamber was filled with Healon 5 viscoelastic.  A 2.4 millimeter keratome was used to make a near-clear corneal incision at the 8:00 position.  A curvilinear capsulorrhexis was made with a cystotome and capsulorrhexis forceps.  Balanced salt solution was used to hydrodissect and hydrodelineate the nucleus.   Phacoemulsification was then used in stop and chop fashion to remove the lens nucleus and epinucleus.   The remaining cortex was then removed using the irrigation and aspiration handpiece. Healon was then placed into the capsular bag to distend it for lens placement.  A lens was then injected into the capsular bag.  The remaining viscoelastic was aspirated.   Wounds were hydrated with balanced salt solution.  The anterior chamber was inflated to a physiologic pressure with balanced salt solution.   The iris was very floppy but there was no iris prolapse and adequate dilation was achieved with viscoelastic devices only.  No intracameral antibiotics were used due to documented allergies to penicillin and levofloxacin. Polytrim drops were placed on the ocular surface.  No wound leaks were noted.  The patient was taken to the recovery room in stable condition without complications of anesthesia or surgery  Benay Pillow 12/21/2019, 9:33 AM

## 2019-12-21 NOTE — Anesthesia Procedure Notes (Signed)
Procedure Name: MAC Date/Time: 12/21/2019 9:15 AM Performed by: Jeannene Patella, CRNA Pre-anesthesia Checklist: Patient identified, Emergency Drugs available, Suction available, Timeout performed and Patient being monitored Patient Re-evaluated:Patient Re-evaluated prior to induction Oxygen Delivery Method: Nasal cannula Placement Confirmation: positive ETCO2

## 2019-12-22 ENCOUNTER — Encounter: Payer: Self-pay | Admitting: Ophthalmology

## 2019-12-23 ENCOUNTER — Ambulatory Visit (INDEPENDENT_AMBULATORY_CARE_PROVIDER_SITE_OTHER): Payer: Medicare HMO | Admitting: *Deleted

## 2019-12-23 DIAGNOSIS — I5022 Chronic systolic (congestive) heart failure: Secondary | ICD-10-CM

## 2019-12-23 LAB — CUP PACEART REMOTE DEVICE CHECK
Battery Remaining Longevity: 17 mo
Battery Voltage: 2.91 V
Brady Statistic RA Percent Paced: 0 %
Brady Statistic RV Percent Paced: 89.7 %
Date Time Interrogation Session: 20210915022604
HighPow Impedance: 41 Ohm
HighPow Impedance: 54 Ohm
Implantable Lead Implant Date: 20070525
Implantable Lead Implant Date: 20070525
Implantable Lead Implant Date: 20070525
Implantable Lead Location: 753858
Implantable Lead Location: 753859
Implantable Lead Location: 753860
Implantable Lead Model: 4194
Implantable Lead Model: 5076
Implantable Lead Model: 6949
Implantable Pulse Generator Implant Date: 20170116
Lead Channel Impedance Value: 190 Ohm
Lead Channel Impedance Value: 304 Ohm
Lead Channel Impedance Value: 342 Ohm
Lead Channel Impedance Value: 456 Ohm
Lead Channel Impedance Value: 456 Ohm
Lead Channel Impedance Value: 475 Ohm
Lead Channel Pacing Threshold Amplitude: 0.625 V
Lead Channel Pacing Threshold Amplitude: 0.625 V
Lead Channel Pacing Threshold Pulse Width: 0.4 ms
Lead Channel Pacing Threshold Pulse Width: 0.4 ms
Lead Channel Sensing Intrinsic Amplitude: 13.375 mV
Lead Channel Sensing Intrinsic Amplitude: 13.375 mV
Lead Channel Sensing Intrinsic Amplitude: 2.75 mV
Lead Channel Sensing Intrinsic Amplitude: 2.75 mV
Lead Channel Setting Pacing Amplitude: 2 V
Lead Channel Setting Pacing Amplitude: 2 V
Lead Channel Setting Pacing Amplitude: 2 V
Lead Channel Setting Pacing Pulse Width: 0.4 ms
Lead Channel Setting Pacing Pulse Width: 0.4 ms
Lead Channel Setting Sensing Sensitivity: 0.45 mV

## 2019-12-25 NOTE — Progress Notes (Signed)
Remote ICD transmission.   

## 2019-12-30 ENCOUNTER — Other Ambulatory Visit: Payer: Self-pay | Admitting: Pulmonary Disease

## 2019-12-30 ENCOUNTER — Other Ambulatory Visit: Payer: Self-pay | Admitting: Cardiology

## 2019-12-30 NOTE — Telephone Encounter (Signed)
Refill Request.  

## 2019-12-31 NOTE — Telephone Encounter (Signed)
Pt is requesting refill on prednisone has ov scheduled for 06/29/2019

## 2020-01-01 ENCOUNTER — Other Ambulatory Visit: Payer: Self-pay | Admitting: Pulmonary Disease

## 2020-01-01 NOTE — Telephone Encounter (Signed)
Pt was informed of predisone was at pharmacy for pick up

## 2020-01-12 ENCOUNTER — Telehealth: Payer: Self-pay | Admitting: Cardiology

## 2020-01-12 NOTE — Telephone Encounter (Signed)
Pt c/o swelling: STAT is pt has developed SOB within 24 hours  1) How much weight have you gained and in what time span? No weight gain  2) If swelling, where is the swelling located? Both feet  3) Are you currently taking a fluid pill? yes  4) Are you currently SOB? Not now, On exertion  5) Do you have a log of your daily weights (if so, list)? n/a  6) Have you gained 3 pounds in a day or 5 pounds in a week? no  7) Have you traveled recently? No    Per wife, patient woke up with a funny feeling in his leg. It was aching from the knee up. His feet normally swell but they usually go down and today the swelling has not gone down. She states that his HR has been ranging from 39-40 then shooting up to 100. BP has been OK and O2 has been OK.

## 2020-01-12 NOTE — Telephone Encounter (Signed)
Spoke to patient. He states he had swelling this morning and but it has gone done this afternoon. Patient states he did not take  20 mg of lasix yesterday evening due to going  Church that evening,    RN recommend that  He can take additional  40 mg   For 2 days in a row if needed for swelling if no changes , contact office Patient verbalized understanding.

## 2020-01-12 NOTE — Telephone Encounter (Signed)
Spoke with the patient's wife who states that the patient has already spoken to a nurse and he was advised to increase his fluid pill.

## 2020-01-14 DIAGNOSIS — H2512 Age-related nuclear cataract, left eye: Secondary | ICD-10-CM | POA: Diagnosis not present

## 2020-01-14 DIAGNOSIS — I509 Heart failure, unspecified: Secondary | ICD-10-CM | POA: Diagnosis not present

## 2020-01-20 ENCOUNTER — Other Ambulatory Visit: Payer: Self-pay | Admitting: Family Medicine

## 2020-01-20 NOTE — Telephone Encounter (Signed)
Name of Medication: Alprazolam Name of Pharmacy: [facility] or Written Date and Quantity: 11/16/19, #30 Last Office Visit and Type: 11/16/19, AWV Next Office Visit and Type: none Last Controlled Substance Agreement Date: none Last UDS: none

## 2020-01-20 NOTE — Telephone Encounter (Signed)
ERx 

## 2020-01-21 ENCOUNTER — Encounter: Payer: Self-pay | Admitting: Ophthalmology

## 2020-01-21 ENCOUNTER — Other Ambulatory Visit: Payer: Self-pay

## 2020-01-22 DIAGNOSIS — B079 Viral wart, unspecified: Secondary | ICD-10-CM | POA: Diagnosis not present

## 2020-01-22 DIAGNOSIS — L118 Other specified acantholytic disorders: Secondary | ICD-10-CM | POA: Diagnosis not present

## 2020-01-22 DIAGNOSIS — D485 Neoplasm of uncertain behavior of skin: Secondary | ICD-10-CM | POA: Diagnosis not present

## 2020-01-26 DIAGNOSIS — J4530 Mild persistent asthma, uncomplicated: Secondary | ICD-10-CM | POA: Diagnosis not present

## 2020-01-26 DIAGNOSIS — K219 Gastro-esophageal reflux disease without esophagitis: Secondary | ICD-10-CM | POA: Diagnosis not present

## 2020-01-26 DIAGNOSIS — J300 Vasomotor rhinitis: Secondary | ICD-10-CM | POA: Diagnosis not present

## 2020-01-26 DIAGNOSIS — H1045 Other chronic allergic conjunctivitis: Secondary | ICD-10-CM | POA: Diagnosis not present

## 2020-01-27 ENCOUNTER — Other Ambulatory Visit: Payer: Self-pay

## 2020-01-27 ENCOUNTER — Ambulatory Visit: Payer: Medicare HMO

## 2020-01-27 DIAGNOSIS — I4892 Unspecified atrial flutter: Secondary | ICD-10-CM | POA: Diagnosis not present

## 2020-01-27 DIAGNOSIS — Z5181 Encounter for therapeutic drug level monitoring: Secondary | ICD-10-CM | POA: Diagnosis not present

## 2020-01-27 DIAGNOSIS — Z7901 Long term (current) use of anticoagulants: Secondary | ICD-10-CM

## 2020-01-27 LAB — POCT INR: INR: 2.4 (ref 2.0–3.0)

## 2020-01-27 NOTE — Patient Instructions (Signed)
-   continue dosage of warfarin of 1 tablet every day EXCEPT 1/2 TABLET ON MONDAYS. - recheck in 6 weeks.  BE CONSISTENT WITH YOUR GREENS INTAKE - pick a day or days to have your greens and do that every week.

## 2020-01-28 ENCOUNTER — Other Ambulatory Visit
Admission: RE | Admit: 2020-01-28 | Discharge: 2020-01-28 | Disposition: A | Discharge status: Home / Self Care | Payer: Medicare HMO | Source: Ambulatory Visit | Attending: Ophthalmology | Admitting: Ophthalmology

## 2020-01-28 DIAGNOSIS — Z01812 Encounter for preprocedural laboratory examination: Secondary | ICD-10-CM | POA: Insufficient documentation

## 2020-01-28 DIAGNOSIS — Z20822 Contact with and (suspected) exposure to covid-19: Secondary | ICD-10-CM | POA: Insufficient documentation

## 2020-01-28 LAB — SARS CORONAVIRUS 2 (TAT 6-24 HRS): SARS Coronavirus 2: NEGATIVE

## 2020-01-28 NOTE — Discharge Instructions (Signed)

## 2020-01-29 NOTE — Anesthesia Preprocedure Evaluation (Addendum)
Anesthesia Evaluation  Patient identified by MRN, date of birth, ID band Patient awake    Reviewed: Allergy & Precautions, H&P , NPO status , Patient's Chart, lab work & pertinent test results, reviewed documented beta blocker date and time   History of Anesthesia Complications Negative for: history of anesthetic complications  Airway Mallampati: II  TM Distance: >3 FB Neck ROM: full    Dental no notable dental hx. (+) Upper Dentures, Lower Dentures   Pulmonary shortness of breath, asthma ,   Pulm HTN   Pulmonary exam normal breath sounds clear to auscultation       Cardiovascular hypertension, (-) angina+ Peripheral Vascular Disease (Celiac artery aneurysm) and +CHF (NICM EF 20-25%, AICD in place)  (-) DOE Normal cardiovascular exam+ dysrhythmias (chronic a-flutter, on warfarin) + Cardiac Defibrillator (MDT Viva Xt Crt-D  [id number], Serial number [id number], s/p generator change 04/2015)  Rhythm:regular Rate:Normal  ACID in place for low EF- has never discharged.  TTE (2019): LA severely dilated LV mod dilated. LVEF 20-25%. Akinesis of the lateral, inferolateral, and inferior myocardium. Grade 2 diastolic dysfunction. Trivial AI. Mod MR. RA mildly dilated Severe pHNT (PA peak pressure 70 mm Hg) Trivial pericardial effusion   Neuro/Psych PSYCHIATRIC DISORDERS Anxiety    GI/Hepatic GERD  Controlled, Poryphyria  IBS Diverticulosis   Endo/Other  diabetes (Pre-DM)  Renal/GU      Musculoskeletal   Abdominal   Peds  Hematology  (+) Blood dyscrasia, anemia , Chronic thrombocytopenia, most recent platelets 136   Anesthesia Other Findings   Reproductive/Obstetrics                            Anesthesia Physical  Anesthesia Plan  ASA: III  Anesthesia Plan: MAC   Post-op Pain Management:    Induction: Intravenous  PONV Risk Score and Plan: 1 and TIVA, Midazolam and Treatment may vary  due to age or medical condition  Airway Management Planned: Nasal Cannula  Additional Equipment:   Intra-op Plan:   Post-operative Plan:   Informed Consent: I have reviewed the patients History and Physical, chart, labs and discussed the procedure including the risks, benefits and alternatives for the proposed anesthesia with the patient or authorized representative who has indicated his/her understanding and acceptance.       Plan Discussed with: CRNA and Anesthesiologist  Anesthesia Plan Comments:         Anesthesia Quick Evaluation

## 2020-02-01 ENCOUNTER — Encounter
Admission: RE | Disposition: A | Discharge status: Home / Self Care | Payer: Self-pay | Source: Ambulatory Visit | Attending: Ophthalmology

## 2020-02-01 ENCOUNTER — Ambulatory Visit
Admission: RE | Admit: 2020-02-01 | Discharge: 2020-02-01 | Disposition: A | Discharge status: Home / Self Care | Payer: Medicare HMO | Source: Ambulatory Visit | Attending: Ophthalmology | Admitting: Ophthalmology

## 2020-02-01 ENCOUNTER — Other Ambulatory Visit: Payer: Self-pay

## 2020-02-01 ENCOUNTER — Ambulatory Visit: Payer: Medicare HMO | Admitting: Anesthesiology

## 2020-02-01 ENCOUNTER — Encounter: Payer: Self-pay | Admitting: Ophthalmology

## 2020-02-01 DIAGNOSIS — N400 Enlarged prostate without lower urinary tract symptoms: Secondary | ICD-10-CM | POA: Diagnosis not present

## 2020-02-01 DIAGNOSIS — Z8249 Family history of ischemic heart disease and other diseases of the circulatory system: Secondary | ICD-10-CM | POA: Diagnosis not present

## 2020-02-01 DIAGNOSIS — Z95810 Presence of automatic (implantable) cardiac defibrillator: Secondary | ICD-10-CM | POA: Diagnosis not present

## 2020-02-01 DIAGNOSIS — I272 Pulmonary hypertension, unspecified: Secondary | ICD-10-CM | POA: Diagnosis not present

## 2020-02-01 DIAGNOSIS — Z7901 Long term (current) use of anticoagulants: Secondary | ICD-10-CM | POA: Insufficient documentation

## 2020-02-01 DIAGNOSIS — I5022 Chronic systolic (congestive) heart failure: Secondary | ICD-10-CM | POA: Diagnosis not present

## 2020-02-01 DIAGNOSIS — M1990 Unspecified osteoarthritis, unspecified site: Secondary | ICD-10-CM | POA: Insufficient documentation

## 2020-02-01 DIAGNOSIS — H2181 Floppy iris syndrome: Secondary | ICD-10-CM | POA: Diagnosis not present

## 2020-02-01 DIAGNOSIS — H2512 Age-related nuclear cataract, left eye: Secondary | ICD-10-CM | POA: Diagnosis not present

## 2020-02-01 DIAGNOSIS — I4892 Unspecified atrial flutter: Secondary | ICD-10-CM | POA: Insufficient documentation

## 2020-02-01 DIAGNOSIS — Z79899 Other long term (current) drug therapy: Secondary | ICD-10-CM | POA: Diagnosis not present

## 2020-02-01 DIAGNOSIS — I428 Other cardiomyopathies: Secondary | ICD-10-CM | POA: Diagnosis not present

## 2020-02-01 DIAGNOSIS — I739 Peripheral vascular disease, unspecified: Secondary | ICD-10-CM | POA: Insufficient documentation

## 2020-02-01 DIAGNOSIS — R7303 Prediabetes: Secondary | ICD-10-CM | POA: Insufficient documentation

## 2020-02-01 DIAGNOSIS — Z7952 Long term (current) use of systemic steroids: Secondary | ICD-10-CM | POA: Insufficient documentation

## 2020-02-01 DIAGNOSIS — F419 Anxiety disorder, unspecified: Secondary | ICD-10-CM | POA: Insufficient documentation

## 2020-02-01 DIAGNOSIS — K589 Irritable bowel syndrome without diarrhea: Secondary | ICD-10-CM | POA: Insufficient documentation

## 2020-02-01 DIAGNOSIS — J45909 Unspecified asthma, uncomplicated: Secondary | ICD-10-CM | POA: Insufficient documentation

## 2020-02-01 DIAGNOSIS — I110 Hypertensive heart disease with heart failure: Secondary | ICD-10-CM | POA: Insufficient documentation

## 2020-02-01 DIAGNOSIS — H25812 Combined forms of age-related cataract, left eye: Secondary | ICD-10-CM | POA: Diagnosis not present

## 2020-02-01 HISTORY — PX: CATARACT EXTRACTION W/PHACO: SHX586

## 2020-02-01 SURGERY — PHACOEMULSIFICATION, CATARACT, WITH IOL INSERTION
Anesthesia: Monitor Anesthesia Care | Site: Eye | Laterality: Left

## 2020-02-01 MED ORDER — EPINEPHRINE PF 1 MG/ML IJ SOLN
INTRAOCULAR | Status: DC | PRN
  Administered 2020-02-01: 79 mL via OPHTHALMIC

## 2020-02-01 MED ORDER — SODIUM HYALURONATE 23 MG/ML IO SOLN
INTRAOCULAR | Status: DC | PRN
  Administered 2020-02-01: 0.6 mL via INTRAOCULAR

## 2020-02-01 MED ORDER — ARMC OPHTHALMIC DILATING DROPS
1.0000 "application " | OPHTHALMIC | Status: DC | PRN
  Administered 2020-02-01 (×3): 1 via OPHTHALMIC

## 2020-02-01 MED ORDER — LACTATED RINGERS IV SOLN: INTRAVENOUS | Status: DC

## 2020-02-01 MED ORDER — POLYMYXIN B-TRIMETHOPRIM 10000-0.1 UNIT/ML-% OP SOLN
OPHTHALMIC | Status: DC | PRN
  Administered 2020-02-01: 1 [drp] via OPHTHALMIC

## 2020-02-01 MED ORDER — ACETAMINOPHEN 10 MG/ML IV SOLN: 1000.0000 mg | Freq: Once | INTRAVENOUS | Status: DC | PRN

## 2020-02-01 MED ORDER — TETRACAINE HCL 0.5 % OP SOLN
1.0000 [drp] | OPHTHALMIC | Status: DC | PRN
  Administered 2020-02-01 (×3): 1 [drp] via OPHTHALMIC

## 2020-02-01 MED ORDER — FENTANYL CITRATE (PF) 100 MCG/2ML IJ SOLN
INTRAMUSCULAR | Status: DC | PRN
  Administered 2020-02-01: 25 ug via INTRAVENOUS
  Administered 2020-02-01: 50 ug via INTRAVENOUS
  Administered 2020-02-01: 25 ug via INTRAVENOUS

## 2020-02-01 MED ORDER — SODIUM HYALURONATE 10 MG/ML IO SOLN
INTRAOCULAR | Status: DC | PRN
  Administered 2020-02-01: 0.55 mL via INTRAOCULAR

## 2020-02-01 MED ORDER — LIDOCAINE HCL (PF) 2 % IJ SOLN
INTRAOCULAR | Status: DC | PRN
  Administered 2020-02-01: 4 mL via INTRAOCULAR

## 2020-02-01 MED ORDER — ONDANSETRON HCL 4 MG/2ML IJ SOLN: 4.0000 mg | Freq: Once | INTRAMUSCULAR | Status: DC | PRN

## 2020-02-01 SURGICAL SUPPLY — 18 items
CANNULA ANT/CHMB 27GA (MISCELLANEOUS) ×4 IMPLANT
DISSECTOR HYDRO NUCLEUS 50X22 (MISCELLANEOUS) ×2 IMPLANT
GLOVE SURG LX 7.5 STRW (GLOVE) ×1
GLOVE SURG LX STRL 7.5 STRW (GLOVE) ×1 IMPLANT
GLOVE SURG SYN 8.5  E (GLOVE) ×2
GLOVE SURG SYN 8.5 E (GLOVE) ×1 IMPLANT
GOWN STRL REUS W/ TWL LRG LVL3 (GOWN DISPOSABLE) ×2 IMPLANT
GOWN STRL REUS W/TWL LRG LVL3 (GOWN DISPOSABLE) ×4
LENS IOL TECNIS EYHANCE 22.5 (Intraocular Lens) ×2 IMPLANT
MARKER SKIN DUAL TIP RULER LAB (MISCELLANEOUS) ×2 IMPLANT
PACK DR. KING ARMS (PACKS) ×2 IMPLANT
PACK EYE AFTER SURG (MISCELLANEOUS) ×2 IMPLANT
PACK OPTHALMIC (MISCELLANEOUS) ×2 IMPLANT
RING MALYGIN (MISCELLANEOUS) ×2 IMPLANT
SYR 3ML LL SCALE MARK (SYRINGE) ×2 IMPLANT
SYR TB 1ML LUER SLIP (SYRINGE) ×2 IMPLANT
WATER STERILE IRR 250ML POUR (IV SOLUTION) ×2 IMPLANT
WIPE NON LINTING 3.25X3.25 (MISCELLANEOUS) ×2 IMPLANT

## 2020-02-01 NOTE — Transfer of Care (Signed)
Immediate Anesthesia Transfer of Care Note  Patient: [name]  Procedure(s) Performed: CATARACT EXTRACTION PHACO AND INTRAOCULAR LENS PLACEMENT (IOC) LEFT 3.67 00:39.5 (Left Eye)  Patient Location: PACU  Anesthesia Type: MAC  Level of Consciousness: awake, alert  and patient cooperative  Airway and Oxygen Therapy: Patient Spontanous Breathing and Patient connected to supplemental oxygen  Post-op Assessment: Post-op Vital signs reviewed, Patient's Cardiovascular Status Stable, Respiratory Function Stable, Patent Airway and No signs of Nausea or vomiting  Post-op Vital Signs: Reviewed and stable  Complications: No complications documented.

## 2020-02-01 NOTE — Op Note (Signed)
OPERATIVE NOTE  JIHAN RUDY [id number] 02/01/2020   PREOPERATIVE DIAGNOSIS:  Nuclear sclerotic cataract left eye.  H25.12   POSTOPERATIVE DIAGNOSIS:     1.  Nuclear sclerotic cataract left eye.   2.  Intraoperative floppy iris syndrome.   PROCEDURE:  CPT [phone] Complex Phacoemusification with posterior chamber intraocular lens placement of the left eye, requiring use of malyugin ring to stabilize and enlarge a floppy, miotic iris.  LENS:   Implant Name Type Inv. Item Serial No. Manufacturer Lot No. LRB No. Used Action  LENS IOL TECNIS EYHANCE 22.5 - [id number] Intraocular Lens LENS IOL TECNIS EYHANCE 22.5 [id number] JOHNSON   Left 1 Implanted      Procedure(s): CATARACT EXTRACTION PHACO AND INTRAOCULAR LENS PLACEMENT (IOC) LEFT 3.67 00:39.5 (Left)  DIB00 +22.5   ULTRASOUND TIME: 0 minutes 39 seconds.  CDE 3.67   SURGEON:  Benay Pillow, MD, MPH   ANESTHESIA:  Topical with tetracaine drops augmented with 1% preservative-free intracameral lidocaine.  ESTIMATED BLOOD LOSS: <1 mL   COMPLICATIONS:  None.   DESCRIPTION OF PROCEDURE:  The patient was identified in the holding room and transported to the operating room and placed in the supine position under the operating microscope.  The left eye was identified as the operative eye and it was prepped and draped in the usual sterile ophthalmic fashion.   A 1.0 millimeter clear-corneal paracentesis was made at the 5:00 position. 0.5 ml of preservative-free 1% lidocaine with epinephrine was injected into the anterior chamber.  The anterior chamber was filled with Healon 5 viscoelastic.  A 2.4 millimeter keratome was used to make a near-clear corneal incision at the 2:00 position.    The pupil was small and floppy.   A malyugin ring was inserted with a sinsky hook.  A curvilinear capsulorrhexis was made with a cystotome and capsulorrhexis forceps.  Balanced salt solution was used to hydrodissect and hydrodelineate the nucleus.    Phacoemulsification was then used in stop and chop fashion to remove the lens nucleus and epinucleus.  The remaining cortex was then removed using the irrigation and aspiration handpiece. Healon was then placed into the capsular bag to distend it for lens placement.  A lens was then injected into the capsular bag.    The malyugin ring was removed. The remaining viscoelastic was aspirated.   Wounds were hydrated with balanced salt solution.  The anterior chamber was inflated to a physiologic pressure with balanced salt solution.   Due to the patients allergies, no intracameral antibiotics were used.  Polytrim drops were placed on the eye.   No wound leaks were noted.  The patient was taken to the recovery room in stable condition without complications of anesthesia or surgery  Benay Pillow 02/01/2020, 8:53 AM

## 2020-02-01 NOTE — H&P (Signed)
[facility]   Primary Care Physician:  Ria Bush, MD Ophthalmologist: Dr. Benay Pillow  Pre-Procedure History & Physical: HPI:  [name] is a 86 y.o. male here for cataract surgery.   Past Medical History:  Diagnosis Date  . AICD (automatic cardioverter/defibrillator) present    s/p gen change 04/2015 w/ MDT [name] Crt-D  DTBA1D1, Serial number [id number]  . Allergic rhinitis    [name]  . Anemia   . Anxiety   . Arthritis   . Atrial flutter ([place])    A.  Status post cardioversion; B.  Tikosyn therapy - failed, remains in aflutter  . Bilateral pneumonia 11/02/2013   treated with levaquin  . BPH (benign prostatic hyperplasia)   . 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 Lucky Cowboy)  . Chronic lung disease    spirometry 2015 - no obstruction, + mild restrictive lung disease  . Chronic sinusitis   . Chronic systolic congestive heart failure ([place])   . Dyspnea    with exertion  . Extrinsic asthma    [name]  . Fall with injury 05/28/2017  . GERD (gastroesophageal reflux disease) 2010   h/o esophageal stricture with dilation, LA grade C reflux esophagitis by EGD 2010  . Goiter   . History of diverticulitis of colon   . History of GI bleed    Secondary to hemorrhoids  . History of seasonal allergies   . Hypertension   . IBS (irritable bowel syndrome)   . LBBB (left bundle branch block)   . Multiple pulmonary nodules 06/2013   RUL ([facility]ix [facility]) - ?vasculitis as of last CT at [facility]  . NICM (nonischemic cardiomyopathy) [facility])    Cardiac catheterization March 2006 without coronary disease; EF 25% 2018 Jan  . Orthopnea   . Porphyria ([place])   . Presence of permanent cardiac pacemaker   . Sinus congestion 09/25/2010    Past Surgical History:  Procedure Laterality Date  . BACK SURGERY  1997   bulging disks  . BiV-AICD implant     Medtronic  . CARDIAC CATHETERIZATION  06/22/04   Severe, nonischemic cardiomyopathy,EF  25-30%  . CARDIOVERSION  02/22/09   AFlutter-([facility])  . CATARACT EXTRACTION W/PHACO Right 12/21/2019   Procedure: CATARACT EXTRACTION PHACO AND INTRAOCULAR LENS PLACEMENT (IOC) RIGHT 2.23  00:21.9;  Surgeon: Eulogio Bear, MD;  Location: [place];  Service: Ophthalmology;  Laterality: Right;  . CHOLECYSTECTOMY    . COLONOSCOPY  10/99   Divertic, splenic, hepatic fleure only  . COLONOSCOPY  10/21/08   aborted-divertics, int hemms (Dr. Vira Agar)  . CORONARY ANGIOPLASTY    . EP IMPLANTABLE DEVICE N/A 04/25/2015   Procedure: BIV ICD Generator Changeout;  Surgeon: Evans Lance, MD;  Location: [place] [facility];  Service: Cardiovascular;  Laterality: N/A;  . ESOPHAGEAL DILATION  02/18/98  . ESOPHAGOGASTRODUODENOSCOPY  01/1998   stricture, sliding HH, GERD  . ESOPHAGOGASTRODUODENOSCOPY  04/08/01   stricture, gastritis, HH, GERD-no dilation(Dr. Henrene Pastor)  . ESOPHAGOGASTRODUODENOSCOPY  12/22/04   stricture; gastritis; duodenitis, GERD  . ESOPHAGOGASTRODUODENOSCOPY  10/21/08   Reflux esophagitis; Erythem. Duod. (Dr. Vira Agar)  . ESOPHAGOGASTRODUODENOSCOPY  08/2013   erythema gastric fundus - minimal gastritis, HH ([place])  . ESOPHAGOGASTRODUODENOSCOPY  08/2016   dysphagia - mod schatzki rin, dilated Tiffany Kocher)  . ESOPHAGOGASTRODUODENOSCOPY (EGD) WITH PROPOFOL N/A 08/24/2016   mod schatzki ring dilated, duodenal deformity Vira Agar, Gavin Pound, MD)  . goiter removal    .  HERNIA REPAIR    . HERNIA REPAIR  01/30/02   Dr. Hassell Done  . INSERT / REPLACE / REMOVE PACEMAKER  2007 and 2017  . JOINT REPLACEMENT Right    shoulder  . NOSE SURGERY  1971   sinus surgery  . PENILE PROSTHESIS IMPLANT  2007   Otelin  . PFT  10/2013   FVC 61%, FEV1 63%, ratio 0.76  . Pulmonary eval  04/2002   ([facility]) Chronic congestive symptoms  . REVERSE SHOULDER ARTHROPLASTY Right 01/17/2016   Procedure: REVERSE SHOULDER ARTHROPLASTY;  Surgeon: Corky Mull, MD;  Location: [facility];  Service: Orthopedics;  Laterality: Right;   . REVERSE TOTAL SHOULDER ARTHROPLASTY Right 01/2016   Poggi  . US ECHOCARDIOGRAPHY  07/13/04   EF 25-30%, Mod LVH; LA severe dilation; Mild AR; IRTR  . US ECHOCARDIOGRAPHY  11/27/06   hypokinesis posterior wall, EF 35%; mild AR    Prior to Admission medications   Medication Sig Start Date End Date Taking? Authorizing Provider  acetaminophen (TYLENOL) 500 MG tablet Take 250 mg by mouth daily as needed. Pain    Yes [provider]  ALPRAZolam (XANAX) 0.25 MG tablet TAKE 1 TABLET (0.25 MG TOTAL) BY MOUTH AT BEDTIME 01/20/20  Yes Ria Bush, MD  alum & mag hydroxide-simeth (MAALOX/MYLANTA) 200-200-20 MG/5ML suspension Take 15 mLs by mouth daily as needed for indigestion or heartburn.   Yes [provider]  budesonide-formoterol (SYMBICORT) 160-4.5 MCG/ACT inhaler Inhale 2 puffs into the lungs at bedtime.   Yes [provider]  carvedilol (COREG) 6.25 MG tablet TAKE 1 AND 1/2 TABLETS BY MOUTH TWICE DAILY 11/23/19  Yes [name], MD  fluticasone (FLONASE) 50 MCG/ACT nasal spray Place 1 spray into both nostrils daily.   Yes [provider]  furosemide (LASIX) 40 MG tablet TAKE 1 TABLET BY MOUTH EVERY DAY Patient taking differently: 20 mg 2 (two) times daily.  07/17/19  Yes [name], MD  guaiFENesin 200 MG tablet Take 400 mg by mouth every 4 (four) hours as needed for cough or to loosen phlegm.   Yes [provider]  levalbuterol Penne Lash HFA) 45 MCG/ACT inhaler Inhale 1-2 puffs into the lungs every 6 (six) hours as needed for wheezing or shortness of breath. 08/31/14  Yes Ria Bush, MD  Loratadine 10 MG CAPS Take 1 capsule by mouth daily as needed (allergies).    Yes [provider]  montelukast (SINGULAIR) 10 MG tablet Take 10 mg by mouth daily as needed (BREATHING).    Yes [provider]  predniSONE (DELTASONE) 10 MG tablet TAKE 1 TABLET (10 MG TOTAL) BY MOUTH DAILY WITH BREAKFAST. 01/01/20  Yes Olalere, Adewale A,  MD  predniSONE (DELTASONE) 5 MG tablet Take 1 tablet (5 mg total) by mouth 2 (two) times daily with a meal. 12/10/19  Yes Olalere, Adewale A, MD  tamsulosin (FLOMAX) 0.4 MG CAPS capsule TAKE 1 CAPSULE BY MOUTH EVERY DAY 10/26/19  Yes Billey Co, MD  vitamin B-12 (CYANOCOBALAMIN) 500 MCG tablet Take 2 tablets (1,000 mcg total) by mouth daily. 11/16/19  Yes Ria Bush, MD  warfarin (COUMADIN) 5 MG tablet TAKE 1/2 TO 1 TABLET BY MOUTH DAILY AS DIRECTED BY COUMADIN CLINIC 12/30/19  Yes Evans Lance, MD  polyethylene glycol [facility] / Floria Raveling) packet Take 17 g by mouth daily as needed (constipation).     [provider]    Allergies as of 01/01/2020 - Review Complete 12/21/2019  Allergen Reaction Noted  . Carafate [sucralfate] Other (  See Comments) 07/12/2016  . Clarithromycin Nausea Only 12/16/2006  . Famotidine Other (See Comments) 12/16/2006  . Levaquin [levofloxacin] Other (See Comments) 04/11/2015  . Omeprazole Diarrhea 12/16/2006  . Oxytetracycline Other (See Comments) 12/16/2006  . Penicillins Swelling 12/16/2006  . Proton pump inhibitors Other (See Comments) 10/21/2011  . Ranitidine Diarrhea 12/08/2008  . Doxycycline Rash 08/27/2014    Family History  Problem Relation Age of Onset  . Coronary artery disease Father   . Hypertension Father   . Heart failure Mother   . Dysphagia Sister   . Breast cancer Other   . Ovarian cancer Other   . Uterine cancer Other   . Other Sister        stomach problems  . Other Sister        stomach problems  . Other Sister        stomach problems  . Alcohol abuse Maternal Uncle     Social History   Socioeconomic History  . Marital status: Married    Spouse name: Not on file  . Number of children: 3  . Years of education: Not on file  . Highest education level: Not on file  Occupational History  . Occupation: retired    Fish farm manager: RETIRED  Tobacco Use  . Smoking status: Never Smoker  . Smokeless tobacco: Never Used   Vaping Use  . Vaping Use: Never used  Substance and Sexual Activity  . Alcohol use: No  . Drug use: No  . Sexual activity: Yes  Other Topics Concern  . Not on file  Social History Narrative   Lives with wife   3 children-8 grandchildren   Still works on a farm.   Retired from [place] friend with Dr. Jefm Bryant   Activity: No regular exercise   Diet: good water, fruits/vegetables daily   Social Determinants of Health   Financial Resource Strain:   . Difficulty of Paying Living Expenses: Not on file  Food Insecurity:   . Worried About Charity fundraiser in the Last Year: Not on file  . Ran Out of Food in the Last Year: Not on file  Transportation Needs:   . Lack of Transportation (Medical): Not on file  . Lack of Transportation (Non-Medical): Not on file  Physical Activity:   . Days of Exercise per Week: Not on file  . Minutes of Exercise per Session: Not on file  Stress:   . Feeling of Stress : Not on file  Social Connections:   . Frequency of Communication with Friends and Family: Not on file  . Frequency of Social Gatherings with Friends and Family: Not on file  . Attends Religious Services: Not on file  . Active Member of Clubs or Organizations: Not on file  . Attends Archivist Meetings: Not on file  . Marital Status: Not on file  Intimate Partner Violence:   . Fear of Current or Ex-Partner: Not on file  . Emotionally Abused: Not on file  . Physically Abused: Not on file  . Sexually Abused: Not on file    Review of Systems: See HPI, otherwise negative ROS  Physical Exam: BP 118/80   Pulse 75   Temp (!) 97.5 F (36.4 C)   Ht 5\' 5"  (1.651 m)   Wt 63 kg   SpO2 99%   BMI 23.13 kg/m  General:   Alert,  pleasant and cooperative in NAD Head:  Normocephalic and atraumatic.  Impression/Plan: [name] Forth  B [name] is here for cataract surgery.  Risks, benefits, limitations, and alternatives regarding cataract  surgery have been reviewed with the patient.  Questions have been answered.  All parties agreeable.   Benay Pillow, MD  02/01/2020, 8:09 AM

## 2020-02-01 NOTE — Anesthesia Postprocedure Evaluation (Signed)
Anesthesia Post Note  Patient: [name]  Procedure(s) Performed: CATARACT EXTRACTION PHACO AND INTRAOCULAR LENS PLACEMENT (IOC) LEFT 3.67 00:39.5 (Left Eye)     Patient location during evaluation: PACU Anesthesia Type: MAC Level of consciousness: awake and alert Pain management: pain level controlled Vital Signs Assessment: post-procedure vital signs reviewed and stable Respiratory status: spontaneous breathing, nonlabored ventilation, respiratory function stable and patient connected to nasal cannula oxygen Cardiovascular status: stable and blood pressure returned to baseline Postop Assessment: no apparent nausea or vomiting Anesthetic complications: no   No complications documented.  Skylier Kretschmer A  Akshath Mccarey

## 2020-02-02 NOTE — Progress Notes (Signed)
Left message to return call if had problems

## 2020-02-03 ENCOUNTER — Encounter: Payer: Self-pay | Admitting: Ophthalmology

## 2020-02-15 ENCOUNTER — Telehealth: Payer: Self-pay | Admitting: Internal Medicine

## 2020-02-15 DIAGNOSIS — Z79899 Other long term (current) drug therapy: Secondary | ICD-10-CM

## 2020-02-15 DIAGNOSIS — I5022 Chronic systolic (congestive) heart failure: Secondary | ICD-10-CM

## 2020-02-15 NOTE — Telephone Encounter (Signed)
Returned call to pt he states that his weight is usually about 138 and today it was 141 which is up about4#. Informed pt ok to take additional lasix today and discuss this at appt tomorrow and have bmet done tomorrow to see how it is running. Verbalized understanding.

## 2020-02-15 NOTE — Telephone Encounter (Signed)
Pt c/o swelling: STAT is pt has developed SOB within 24 hours  1) How much weight have you gained and in what time span? A few days  2) If swelling, where is the swelling located? Feet and legs  3) Are you currently taking a fluid pill? Yes  4) Are you currently SOB? Some time  5) Do you have a log of your daily weights (if so, list)? no  6) Have you gained 3 pounds in a day or 5 pounds in a week? A least 3 pounds  7) Have you traveled recently? No

## 2020-02-16 ENCOUNTER — Ambulatory Visit: Payer: Medicare HMO | Admitting: Internal Medicine

## 2020-02-16 ENCOUNTER — Encounter: Payer: Self-pay | Admitting: Internal Medicine

## 2020-02-16 ENCOUNTER — Other Ambulatory Visit: Payer: Self-pay

## 2020-02-16 VITALS — BP 130/82 | HR 81 | Ht 65.0 in | Wt 146.8 lb

## 2020-02-16 DIAGNOSIS — Z95810 Presence of automatic (implantable) cardiac defibrillator: Secondary | ICD-10-CM | POA: Diagnosis not present

## 2020-02-16 DIAGNOSIS — I429 Cardiomyopathy, unspecified: Secondary | ICD-10-CM

## 2020-02-16 DIAGNOSIS — I5022 Chronic systolic (congestive) heart failure: Secondary | ICD-10-CM | POA: Diagnosis not present

## 2020-02-16 DIAGNOSIS — I482 Chronic atrial fibrillation, unspecified: Secondary | ICD-10-CM

## 2020-02-16 MED ORDER — FUROSEMIDE 40 MG PO TABS: 60.0000 mg | ORAL_TABLET | Freq: Every day | ORAL | 3 refills | Status: DC

## 2020-02-16 NOTE — Patient Instructions (Addendum)
Medication Instructions:  Your physician has recommended you make the following change in your medication:   1.  INCREASE your lasix 40 mg-  Take 1.5 tablets (60 mg) by mouth daily  Labwork: You will get lab work in 2 weeks--March 01, 2020 at the [facility] office any time between 8:00 am and 4:30 pm.  You Do NOT need to be fasting.  Testing/Procedures: None ordered.  Follow-Up: Your physician wants you to follow-up in: one year with Dr. Lovena Le.   You will receive a reminder letter in the mail two months in advance. If you don't receive a letter, please call our office to schedule the follow-up appointment.  Remote monitoring is used to monitor your ICD from home. This monitoring reduces the number of office visits required to check your device to one time per year. It allows [place] to keep an eye on the functioning of your device to ensure it is working properly. You are scheduled for a device check from home on 03/23/2020. You may send your transmission at any time that day. If you have a wireless device, the transmission will be sent automatically. After your physician reviews your transmission, you will receive a postcard with your next transmission date.  Any Other Special Instructions Will Be Listed Below (If Applicable).  If you need a refill on your cardiac medications before your next appointment, please call your pharmacy.

## 2020-02-16 NOTE — Progress Notes (Signed)
HPI [name] returns today for followup. He is a pleasant 86 yo man with chronic systolic heart failure, left atrial flutter, CHB, s/p Biv ICD insertion. He has had worsening sob and peripheral edema. Despite this he grew over 300 tomatoes. He has not had syncope or ICD shock. He has had worsening peripheral edema. He reverted back to atrial fib.   Current Outpatient Medications  Medication Sig Dispense Refill  . acetaminophen (TYLENOL) 500 MG tablet Take 250 mg by mouth daily as needed. Pain     . ALPRAZolam (XANAX) 0.25 MG tablet TAKE 1 TABLET (0.25 MG TOTAL) BY MOUTH AT BEDTIME 30 tablet 0  . alum & mag hydroxide-simeth (MAALOX/MYLANTA) 200-200-20 MG/5ML suspension Take 15 mLs by mouth daily as needed for indigestion or heartburn.    . budesonide-formoterol (SYMBICORT) 160-4.5 MCG/ACT inhaler Inhale 2 puffs into the lungs at bedtime.    . carvedilol (COREG) 6.25 MG tablet TAKE 1 AND 1/2 TABLETS BY MOUTH TWICE DAILY 270 tablet 1  . fluticasone (FLONASE) 50 MCG/ACT nasal spray Place 1 spray into both nostrils daily.    Marland Kitchen guaiFENesin 200 MG tablet Take 400 mg by mouth every 4 (four) hours as needed for cough or to loosen phlegm.    . levalbuterol (XOPENEX HFA) 45 MCG/ACT inhaler Inhale 1-2 puffs into the lungs every 6 (six) hours as needed for wheezing or shortness of breath. 1 Inhaler 1  . Loratadine 10 MG CAPS Take 1 capsule by mouth daily as needed (allergies).     . montelukast (SINGULAIR) 10 MG tablet Take 10 mg by mouth daily as needed (BREATHING).     . polyethylene glycol (MIRALAX / GLYCOLAX) packet Take 17 g by mouth daily as needed (constipation).     . predniSONE (DELTASONE) 10 MG tablet TAKE 1 TABLET (10 MG TOTAL) BY MOUTH DAILY WITH BREAKFAST. 30 tablet 1  . predniSONE (DELTASONE) 5 MG tablet Take 1 tablet (5 mg total) by mouth 2 (two) times daily with a meal. 60 tablet 3  . tamsulosin (FLOMAX) 0.4 MG CAPS capsule TAKE 1 CAPSULE BY MOUTH EVERY DAY 90 capsule 2  .  vitamin B-12 (CYANOCOBALAMIN) 500 MCG tablet Take 2 tablets (1,000 mcg total) by mouth daily.    Marland Kitchen warfarin (COUMADIN) 5 MG tablet TAKE 1/2 TO 1 TABLET BY MOUTH DAILY AS DIRECTED BY COUMADIN CLINIC 90 tablet 2  . furosemide (LASIX) 40 MG tablet Take 1.5 tablets (60 mg total) by mouth daily. 140 tablet 3   No current facility-administered medications for this visit.     Past Medical History:  Diagnosis Date  . AICD (automatic cardioverter/defibrillator) present    s/p gen change 04/2015 w/ MDT [name] Crt-D  DTBA1D1, Serial number [id number]  . Allergic rhinitis    [name]  . Anemia   . Anxiety   . Arthritis   . Atrial flutter ([place])    A.  Status post cardioversion; B.  Tikosyn therapy - failed, remains in aflutter  . Bilateral pneumonia 11/02/2013   treated with levaquin  . BPH (benign prostatic hyperplasia)   . 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 Lucky Cowboy)  . Chronic lung disease    spirometry 2015 - no obstruction, + mild restrictive lung disease  . Chronic sinusitis   . Chronic systolic congestive heart failure ([place])   . Dyspnea    with exertion  . Extrinsic asthma    [name]  . Fall with injury  05/28/2017  . GERD (gastroesophageal reflux disease) 2010   h/o esophageal stricture with dilation, LA grade C reflux esophagitis by EGD 2010  . Goiter   . History of diverticulitis of colon   . History of GI bleed    Secondary to hemorrhoids  . History of seasonal allergies   . Hypertension   . IBS (irritable bowel syndrome)   . LBBB (left bundle branch block)   . Multiple pulmonary nodules 06/2013   [facility]UL ([facility]) - ?vasculitis as of last CT [facility]  . [facility]ischemic cardiomyopathy[facility])    Cardiac catheterization March 2006 without coronary disease; EF 25% 2018 Jan  . Orthopnea   . Porphyria ([place])   . Presence of permanent cardiac pacemaker   . Sinus congestion 09/25/2010    ROS:   All systems reviewed and negative  except as noted in the HPI.   Past Surgical History:  Procedure Laterality Date  . BACK SURGERY  1997   bulging disks  . BiV-AICD implant     Medtronic  . CARDIAC CATHETERIZATION  06/22/04   Severe, nonischemic cardiomyopathy,EF 25-30%  . CARDIOVERSION  02/22/09   AFlutter-([facility])  . CATARACT EXTRACTION W/PHACO Right 12/21/2019   Procedure: CATARACT EXTRACTION PHACO AND INTRAOCULAR LENS PLACEMENT (IOC) RIGHT 2.23  00:21.9;  Surgeon: Eulogio Bear, MD;  Location: [place];  Service: Ophthalmology;  Laterality: Right;  . CATARACT EXTRACTION W/PHACO Left 02/01/2020   Procedure: CATARACT EXTRACTION PHACO AND INTRAOCULAR LENS PLACEMENT (IOC) LEFT 3.67 00:39.5;  Surgeon: Eulogio Bear, MD;  Location: [place];  Service: Ophthalmology;  Laterality: Left;  . CHOLECYSTECTOMY    . COLONOSCOPY  10/99   Divertic, splenic, hepatic fleure only  . COLONOSCOPY  10/21/08   aborted-divertics, int hemms (Dr. Vira Agar)  . CORONARY ANGIOPLASTY    . EP IMPLANTABLE DEVICE N/A 04/25/2015   Procedure: BIV ICD Generator Changeout;  Surgeon: Evans Lance, MD;  Location: [place] [facility];  Service: Cardiovascular;  Laterality: N/A;  . ESOPHAGEAL DILATION  02/18/98  . ESOPHAGOGASTRODUODENOSCOPY  01/1998   stricture, sliding HH, GERD  . ESOPHAGOGASTRODUODENOSCOPY  04/08/01   stricture, gastritis, HH, GERD-no dilation(Dr. Henrene Pastor)  . ESOPHAGOGASTRODUODENOSCOPY  12/22/04   stricture; gastritis; duodenitis, GERD  . ESOPHAGOGASTRODUODENOSCOPY  10/21/08   Reflux esophagitis; Erythem. Duod. (Dr. Vira Agar)  . ESOPHAGOGASTRODUODENOSCOPY  08/2013   erythema gastric fundus - minimal gastritis, HH ([place])  . ESOPHAGOGASTRODUODENOSCOPY  08/2016   dysphagia - mod schatzki rin, dilated Tiffany Kocher)  . ESOPHAGOGASTRODUODENOSCOPY (EGD) WITH PROPOFOL N/A 08/24/2016   mod schatzki ring dilated, duodenal deformity Vira Agar, Gavin Pound, MD)  . goiter removal    . HERNIA REPAIR    . HERNIA REPAIR  01/30/02    Dr. Hassell Done  . INSERT / REPLACE / REMOVE PACEMAKER  2007 and 2017  . JOINT REPLACEMENT Right    shoulder  . NOSE SURGERY  1971   sinus surgery  . PENILE PROSTHESIS IMPLANT  2007   Otelin  . PFT  10/2013   FVC 61%, FEV1 63%, ratio 0.76  . Pulmonary eval  04/2002   ([facility]) Chronic congestive symptoms  . REVERSE SHOULDER ARTHROPLASTY Right 01/17/2016   Procedure: REVERSE SHOULDER ARTHROPLASTY;  Surgeon: Corky Mull, MD;  Location: [facility];  Service: Orthopedics;  Laterality: Right;  . REVERSE TOTAL SHOULDER ARTHROPLASTY Right 01/2016   Poggi  . US ECHOCARDIOGRAPHY  07/13/04   EF 25-30%, Mod LVH; LA severe dilation; Mild AR; IRTR  .  US ECHOCARDIOGRAPHY  11/27/06   hypokinesis posterior wall, EF 35%; mild AR     Family History  Problem Relation Age of Onset  . Coronary artery disease Father   . Hypertension Father   . Heart failure Mother   . Dysphagia Sister   . Breast cancer Other   . Ovarian cancer Other   . Uterine cancer Other   . Other Sister        stomach problems  . Other Sister        stomach problems  . Other Sister        stomach problems  . Alcohol abuse Maternal Uncle      Social History   Socioeconomic History  . Marital status: Married    Spouse name: Not on file  . Number of children: 3  . Years of education: Not on file  . Highest education level: Not on file  Occupational History  . Occupation: retired    Fish farm manager: RETIRED  Tobacco Use  . Smoking status: Never Smoker  . Smokeless tobacco: Never Used  Vaping Use  . Vaping Use: Never used  Substance and Sexual Activity  . Alcohol use: No  . Drug use: No  . Sexual activity: Yes  Other Topics Concern  . Not on file  Social History Narrative   Lives with wife   3 children-8 grandchildren   Still works on a farm.   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 Jefm Bryant   Activity: No regular exercise   Diet: good water, fruits/vegetables daily   Social Determinants  of Health   Financial Resource Strain:   . Difficulty of Paying Living Expenses: Not on file  Food Insecurity:   . Worried About Charity fundraiser in the Last Year: Not on file  . Ran Out of Food in the Last Year: Not on file  Transportation Needs:   . Lack of Transportation (Medical): Not on file  . Lack of Transportation (Non-Medical): Not on file  Physical Activity:   . Days of Exercise per Week: Not on file  . Minutes of Exercise per Session: Not on file  Stress:   . Feeling of Stress : Not on file  Social Connections:   . Frequency of Communication with Friends and Family: Not on file  . Frequency of Social Gatherings with Friends and Family: Not on file  . Attends Religious Services: Not on file  . Active Member of Clubs or Organizations: Not on file  . Attends Archivist Meetings: Not on file  . Marital Status: Not on file  Intimate Partner Violence:   . Fear of Current or Ex-Partner: Not on file  . Emotionally Abused: Not on file  . Physically Abused: Not on file  . Sexually Abused: Not on file     BP 130/82   Pulse 81   Ht 5\' 5"  (1.651 m)   Wt 146 lb 12.8 oz (66.6 kg)   SpO2 97%   BMI 24.43 kg/m   Physical Exam:  Well appearing NAD HEENT: Unremarkable Neck:  No JVD, no thyromegally Lymphatics:  No adenopathy Back:  No CVA tenderness Lungs:  Clear HEART:  Regular rate rhythm, no murmurs, no rubs, no clicks Abd:  soft, positive bowel sounds, no organomegally, no rebound, no guarding Ext:  2 plus pulses, no edema, no cyanosis, no clubbing Skin:  No rashes no nodules Neuro:  CN II through XII intact, motor grossly intact  EKG -  Atrial flutter with ventricular pacing  DEVICE  Normal device function.  See PaceArt for details.   Assess/Plan: 1. Chronic systolic heart failure - his symptoms of volume overload have worsened and optivol is up slightly and I have asked him to increase the lasix to 60 mg daily. We will check his electrolytes and  renal function in 2 weeks. 2. ICD - his medtronic biv ICD is working normally 3. Atrial fib/flutter - his VR is well controlled. He has reverted back to atrial fib/flutter 4. Coags - he has not had bleeding on warfarin  Salome Spotted

## 2020-02-17 NOTE — Addendum Note (Signed)
Addended by: Rose Phi on: 02/17/2020 12:00 PM   Modules accepted: Orders

## 2020-02-22 ENCOUNTER — Telehealth: Payer: Self-pay | Admitting: Internal Medicine

## 2020-02-22 NOTE — Progress Notes (Signed)
Cardiology Office Note   Date:  02/23/2020   ID:  [name], DOB 1931-09-14, MRN [id number]  PCP:  Ria Bush, MD  Cardiologist:   No primary care provider on file.   Chief Complaint  Patient presents with  . Shortness of Breath      History of Present Illness: [name] is a 86 y.o. male who presents for follow up of his cardiomyopathy and atrial flutter.  EF on the last echo that I ordered in Jan was 20 - 25%.   He called yesterday with increased SOB.   He has felt increased shortness of breath for about 2 to 3 weeks. He has gained about 5 to 6 pounds above his baseline. He actually increased his Lasix from 40-60 and he still having increased weight gain and swelling. He is not describing new PND or orthopnea. However, he has felt bloated. He has felt tired. He has had some increased shortness of breath with activities. He has had some right upper quadrant discomfort that he thought was gas. He might of had a little more salt few weeks ago with some veins. They have been getting some takeout meals about once a week. He usually watches his salt at home. He is not having any new chest pressure, neck or arm discomfort. Is not having any palpitations, presyncope or syncope.   Past Medical History:  Diagnosis Date  . AICD (automatic cardioverter/defibrillator) present    s/p gen change 04/2015 w/ MDT [name] Crt-D  DTBA1D1, Serial number [id number]  . Allergic rhinitis    [name]  . Anemia   . Anxiety   . Arthritis   . Atrial flutter ([place])    A.  Status post cardioversion; B.  Tikosyn therapy - failed, remains in aflutter  . Bilateral pneumonia 11/02/2013   treated with levaquin  . BPH (benign prostatic hyperplasia)   . 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 Lucky Cowboy)  . Chronic lung disease    spirometry 2015 - no obstruction, + mild restrictive lung disease  . Chronic sinusitis   . Chronic systolic congestive heart failure  ([place])   . Dyspnea    with exertion  . Extrinsic asthma    [name]  . Fall with injury 05/28/2017  . GERD (gastroesophageal reflux disease) 2010   h/o esophageal stricture with dilation, LA grade C reflux esophagitis by EGD 2010  . Goiter   . History of diverticulitis of colon   . History of GI bleed    Secondary to hemorrhoids  . History of seasonal allergies   . Hypertension   . IBS (irritable bowel syndrome)   . LBBB (left bundle branch block)   . Multiple pulmonary nodules 06/2013   RUL ([facility]. Adam Phenix at [facility]) - ?vasculitis as of last CT at [facility]  . NICM (nonischemic cardiomyopathy) [facility])    Cardiac catheterization March 2006 without coronary disease; EF 25% 2018 Jan  . Orthopnea   . Porphyria ([place])   . Presence of permanent cardiac pacemaker   . Sinus congestion 09/25/2010    Past Surgical History:  Procedure Laterality Date  . BACK SURGERY  1997   bulging disks  . BiV-AICD implant     [employer]  . CARDIAC CATHETERIZATION  06/22/04   Severe, nonischemic cardiomyopathy,EF 25-30%  . CARDIOVERSION  02/22/09   AFlutter-([facility])  . CATARACT EXTRACTION W/PHACO Right 12/21/2019   Procedure: CATARACT EXTRACTION PHACO AND INTRAOCULAR LENS  PLACEMENT (IOC) RIGHT 2.23  00:21.9;  Surgeon: Eulogio Bear, MD;  Location: [facility];  Service: Ophthalmology;  Laterality: Right;  . CATARACT EXTRACTION W/PHACO Left 02/01/2020   Procedure: CATARACT EXTRACTION PHACO AND INTRAOCULAR LENS PLACEMENT (IOC) LEFT 3.67 00:39.5;  Surgeon: Eulogio Bear, MD;  Location: [facility];  Service: Ophthalmology;  Laterality: Left;  . CHOLECYSTECTOMY    . COLONOSCOPY  10/99   Divertic, splenic, hepatic fleure only  . COLONOSCOPY  10/21/08   aborted-divertics, int hemms (Dr. Vira Agar)  . CORONARY ANGIOPLASTY    . EP IMPLANTABLE DEVICE N/A 04/25/2015   Procedure: BIV ICD Generator Changeout;  Surgeon: Evans Lance, MD;  Location: [facility];  Service: Cardiovascular;   Laterality: N/A;  . ESOPHAGEAL DILATION  02/18/98  . ESOPHAGOGASTRODUODENOSCOPY  01/1998   stricture, sliding HH, GERD  . ESOPHAGOGASTRODUODENOSCOPY  04/08/01   stricture, gastritis, HH, GERD-no dilation(Dr. Henrene Pastor)  . ESOPHAGOGASTRODUODENOSCOPY  12/22/04   stricture; gastritis; duodenitis, GERD  . ESOPHAGOGASTRODUODENOSCOPY  10/21/08   Reflux esophagitis; Erythem. Duod. (Dr. Vira Agar)  . ESOPHAGOGASTRODUODENOSCOPY  08/2013   erythema gastric fundus - minimal gastritis, HH ([place])  . ESOPHAGOGASTRODUODENOSCOPY  08/2016   dysphagia - mod schatzki rin, dilated Tiffany Kocher)  . ESOPHAGOGASTRODUODENOSCOPY (EGD) WITH PROPOFOL N/A 08/24/2016   mod schatzki ring dilated, duodenal deformity Vira Agar, Gavin Pound, MD)  . goiter removal    . HERNIA REPAIR    . HERNIA REPAIR  01/30/02   Dr. Hassell Done  . INSERT / REPLACE / REMOVE PACEMAKER  2007 and 2017  . JOINT REPLACEMENT Right    shoulder  . NOSE SURGERY  1971   sinus surgery  . PENILE PROSTHESIS IMPLANT  2007   [place]  . PFT  10/2013   FVC 61%, FEV1 63%, ratio 0.76  . Pulmonary eval  04/2002   ([facility]) Chronic congestive symptoms  . REVERSE SHOULDER ARTHROPLASTY Right 01/17/2016   Procedure: REVERSE SHOULDER ARTHROPLASTY;  Surgeon: Corky Mull, MD;  Location: [facility];  Service: Orthopedics;  Laterality: Right;  . REVERSE TOTAL SHOULDER ARTHROPLASTY Right 01/2016   [place]  . US ECHOCARDIOGRAPHY  07/13/04   EF 25-30%, Mod LVH; LA severe dilation; Mild AR; IRTR  . US ECHOCARDIOGRAPHY  11/27/06   hypokinesis posterior wall, EF 35%; mild AR     Current Outpatient Medications  Medication Sig Dispense Refill  . acetaminophen (TYLENOL) 500 MG tablet Take 250 mg by mouth daily as needed. Pain     . ALPRAZolam (XANAX) 0.25 MG tablet TAKE 1 TABLET (0.25 MG TOTAL) BY MOUTH AT BEDTIME 30 tablet 0  . alum & mag hydroxide-simeth (MAALOX/MYLANTA) 200-200-20 MG/5ML suspension Take 15 mLs by mouth daily as needed for indigestion or heartburn.    . budesonide-formoterol  (SYMBICORT) 160-4.5 MCG/ACT inhaler Inhale 2 puffs into the lungs at bedtime.    . carvedilol (COREG) 6.25 MG tablet TAKE 1 AND 1/2 TABLETS BY MOUTH TWICE DAILY 270 tablet 1  . fluticasone (FLONASE) 50 MCG/ACT nasal spray Place 1 spray into both nostrils daily.    . furosemide (LASIX) 40 MG tablet Take 1.5 tablets (60 mg total) by mouth 2 (two) times daily. 140 tablet 3  . guaiFENesin 200 MG tablet Take 400 mg by mouth every 4 (four) hours as needed for cough or to loosen phlegm.    . levalbuterol (XOPENEX HFA) 45 MCG/ACT inhaler Inhale 1-2 puffs into the lungs every 6 (six) hours as needed for wheezing or shortness of breath. 1 Inhaler 1  .  Loratadine 10 MG CAPS Take 1 capsule by mouth daily as needed (allergies).     . montelukast (SINGULAIR) 10 MG tablet Take 10 mg by mouth daily as needed (BREATHING).     . polyethylene glycol (MIRALAX / GLYCOLAX) packet Take 17 g by mouth daily as needed (constipation).     . predniSONE (DELTASONE) 10 MG tablet TAKE 1 TABLET (10 MG TOTAL) BY MOUTH DAILY WITH BREAKFAST. 30 tablet 1  . predniSONE (DELTASONE) 5 MG tablet Take 1 tablet (5 mg total) by mouth 2 (two) times daily with a meal. 60 tablet 3  . tamsulosin (FLOMAX) 0.4 MG CAPS capsule TAKE 1 CAPSULE BY MOUTH EVERY DAY 90 capsule 2  . vitamin B-12 (CYANOCOBALAMIN) 500 MCG tablet Take 2 tablets (1,000 mcg total) by mouth daily.    Marland Kitchen warfarin (COUMADIN) 5 MG tablet TAKE 1/2 TO 1 TABLET BY MOUTH DAILY AS DIRECTED BY COUMADIN CLINIC 90 tablet 2   No current facility-administered medications for this visit.    Allergies:   Carafate [sucralfate], Clarithromycin, Famotidine, Levaquin [levofloxacin], Omeprazole, Oxytetracycline, Penicillins, Proton pump inhibitors, Ranitidine, and Doxycycline   ROS:  Please see the history of present illness.   Otherwise, review of systems are positive for none.   All other systems are reviewed and negative.    PHYSICAL EXAM: VS:  BP 120/60   Pulse 70   Temp (!) 95.9 F  (35.5 C)   Ht 5\' 4"  (1.626 m)   Wt 144 lb 12.8 oz (65.7 kg)   SpO2 97%   BMI 24.85 kg/m  , BMI Body mass index is 24.85 kg/m. GENERAL: Mildly frail appearing NECK: Positive jugular venous distention, waveform within normal limits, carotid upstroke brisk and symmetric, no bruits, no thyromegaly LUNGS: Mildly decreased breath sounds at the bases with few crackles CHEST: Well-healed defibrillator pocket HEART:  PMI not displaced or sustained,S1 and S2 within normal limits, no S3, no clicks, no rubs, no murmurs, irregular ABD:  Flat, positive bowel sounds normal in frequency in pitch, no bruits, no rebound, no guarding, no midline pulsatile mass, positive hepatomegaly, no splenomegaly EXT:  2 plus pulses throughout, moderate bilateral edema above the ankles, dependent rubor    EKG:  EKG is not  ordered today.   Recent Labs: 11/16/2019: ALT 14; BUN 26; Creatinine, Ser 1.41; Hemoglobin 14.3; Platelets 136.0; Potassium 5.1; Sodium 138    Lipid Panel    Component Value Date/Time   CHOL 183 11/06/2018 1206   CHOL 115 09/01/2013 0507   TRIG 130.0 11/06/2018 1206   TRIG 120 09/01/2013 0507   HDL 49.50 11/06/2018 1206   CHOLHDL 4 11/06/2018 1206   VLDL 26.0 11/06/2018 1206   LDLCALC 107 (H) 11/06/2018 1206      Wt Readings from Last 3 Encounters:  02/23/20 144 lb 12.8 oz (65.7 kg)  02/16/20 146 lb 12.8 oz (66.6 kg)  02/01/20 139 lb (63 kg)      Other studies Reviewed: Additional studies/ records that were reviewed today include: None. Review of the above records demonstrates:  NA  ASSESSMENT AND PLAN:  Acute on chronic systolic HF -  I am going to increase his Lasix to 60 mg twice daily. We will get a basic metabolic profile today. I had like him to come back on Friday and have another basic metabolic profile and follow-up exam.  Atrial flutter -   [name] has a CHA2DS2 - VASc score of 3.   He tolerates anticoagulation. I will check a  CBC.  Pulmonary HTN  - He wants conservative therapy with this.  ICD:  He is up-to-date with follow-up and saw Dr. Lovena Le recently.  Current medicines are reviewed at length with the patient today.  The patient does not have concerns regarding medicines.  The following changes have been made: As above  Labs/ tests ordered today include:   Orders Placed This Encounter  Procedures  . Basic metabolic panel  . CBC     Disposition:   FU with with me or APP on Friday   Signed, [name], MD  02/23/2020 3:31 PM    [place]

## 2020-02-22 NOTE — Telephone Encounter (Signed)
See if he can get in with APP today or tomorrow.  If not double book with me tomorrow.

## 2020-02-22 NOTE — Telephone Encounter (Signed)
Pt c/o swelling: STAT is pt has developed SOB within 24 hours  1) How much weight have you gained and in what time span? Yes normal 137 at night about 140  2) If swelling, where is the swelling located? Feet and stomach  3) Are you currently taking a fluid pill? Yes   4) Are you currently SOB? Yes   5) Do you have a log of your daily weights (if so, list)? Yes   6) Have you gained 3 pounds in a day or 5 pounds in a week? yes  7) Have you traveled recently? No

## 2020-02-22 NOTE — Telephone Encounter (Signed)
Operator sent to triage

## 2020-02-22 NOTE — Telephone Encounter (Signed)
Spoke to pt who called triage with c/o SOB x 1 week. He saw MD last week and has increased his Lasix to 60 mg, but he does not feel it is helping. His weight is up about 6-7 lbs from his baseline. Will route this to his primary cardiologists office so they can get him scheduled today.

## 2020-02-23 ENCOUNTER — Other Ambulatory Visit: Payer: Self-pay

## 2020-02-23 ENCOUNTER — Encounter: Payer: Self-pay | Admitting: Cardiology

## 2020-02-23 ENCOUNTER — Ambulatory Visit: Payer: Medicare HMO | Admitting: Cardiology

## 2020-02-23 ENCOUNTER — Other Ambulatory Visit: Payer: Self-pay | Admitting: Pulmonary Disease

## 2020-02-23 VITALS — BP 120/60 | HR 70 | Temp 95.9°F | Ht 64.0 in | Wt 144.8 lb

## 2020-02-23 DIAGNOSIS — I4892 Unspecified atrial flutter: Secondary | ICD-10-CM | POA: Diagnosis not present

## 2020-02-23 DIAGNOSIS — I5023 Acute on chronic systolic (congestive) heart failure: Secondary | ICD-10-CM

## 2020-02-23 DIAGNOSIS — I272 Pulmonary hypertension, unspecified: Secondary | ICD-10-CM

## 2020-02-23 MED ORDER — FUROSEMIDE 40 MG PO TABS: 60.0000 mg | ORAL_TABLET | Freq: Two times a day (BID) | ORAL | 3 refills | Status: DC

## 2020-02-23 NOTE — Patient Instructions (Addendum)
Medication Instructions:  INCREASE your Lasix to 60 mg (1.5 tablets) TWICE A DAY *If you need a refill on your cardiac medications before your next appointment, please call your pharmacy*   Lab Work: BMET and CBC in our office today. If you have labs (blood work) drawn today and your tests are completely normal, you will receive your results only by: Marland Kitchen MyChart Message (if you have MyChart) OR . A paper copy in the mail If you have any lab test that is abnormal or we need to change your treatment, we will call you to review the results.   Testing/Procedures: None ordered   Follow-Up: At [facility], you and your health needs are our priority.  As part of our continuing mission to provide you with exceptional heart care, we have created designated Provider Care Teams.  These Care Teams include your primary Cardiologist (physician) and Advanced Practice Providers (APPs -  Physician Assistants and Nurse Practitioners) who all work together to provide you with the care you need, when you need it.  We recommend signing up for the patient portal called "MyChart".  Sign up information is provided on this After Visit Summary.  MyChart is used to connect with patients for Virtual Visits (Telemedicine).  Patients are able to view lab/test results, encounter notes, upcoming appointments, etc.  Non-urgent messages can be sent to your provider as well.   To learn more about what you can do with MyChart, go to [email or web address].    Your next appointment:   Friday, February 26, 2020  The format for your next appointment:   In Person  Provider:   You may see Dr. [name] or one of the following Advanced Practice Providers on your designated Care Team:    Rosaria Ferries, PA-C  Jory Sims, DNP, ANP    Other Instructions None

## 2020-02-23 NOTE — Telephone Encounter (Addendum)
Pt is requesting PREDNISONE 10 MG TABLET next ov 06/15/20 . Pt was called informed predisone was available for pick up at pharmacy verbalized  Understanding nothing further needed

## 2020-02-24 LAB — CBC
Hematocrit: 42.4 % (ref 37.5–51.0)
Hemoglobin: 14.6 g/dL (ref 13.0–17.7)
MCH: 31.9 pg (ref 26.6–33.0)
MCHC: 34.4 g/dL (ref 31.5–35.7)
MCV: 93 fL (ref 79–97)
Platelets: 173 10*3/uL (ref 150–450)
RBC: 4.57 x10E6/uL (ref 4.14–5.80)
RDW: 15.1 % (ref 11.6–15.4)
WBC: 8.8 10*3/uL (ref 3.4–10.8)

## 2020-02-24 LAB — BASIC METABOLIC PANEL
BUN/Creatinine Ratio: 17 (ref 10–24)
BUN: 19 mg/dL (ref 8–27)
CO2: 25 mmol/L (ref 20–29)
Calcium: 9.3 mg/dL (ref 8.6–10.2)
Chloride: 97 mmol/L (ref 96–106)
Creatinine, Ser: 1.13 mg/dL (ref 0.76–1.27)
GFR calc Af Amer: 68 mL/min/{1.73_m2} (ref >59)
GFR calc non Af Amer: 59 mL/min/{1.73_m2} — ABNORMAL LOW (ref >59)
Glucose: 108 mg/dL — ABNORMAL HIGH (ref 65–99)
Potassium: 5.8 mmol/L (ref 3.5–5.2)
Sodium: 137 mmol/L (ref 134–144)

## 2020-02-25 NOTE — Progress Notes (Deleted)
Cardiology Office Note   Date:  02/25/2020   ID:  [name], DOB 05/18/1931, MRN [id number]  PCP:  Ria Bush, MD  Cardiologist:   No primary care provider on file.   No chief complaint on file.     History of Present Illness: [name] is a 86 y.o. male who presents for follow up of his cardiomyopathy and atrial flutter.  EF on the last echo that I ordered in Jan was 20 - 25%.   I saw him this week. He had increased SOB and edema.  I increased his diuretic.  He returns for follow up.  ***   *** He called yesterday with increased SOB.   He has felt increased shortness of breath for about 2 to 3 weeks. He has gained about 5 to 6 pounds above his baseline. He actually increased his Lasix from 40-60 and he still having increased weight gain and swelling. He is not describing new PND or orthopnea. However, he has felt bloated. He has felt tired. He has had some increased shortness of breath with activities. He has had some right upper quadrant discomfort that he thought was gas. He might of had a little more salt few weeks ago with some veins. They have been getting some takeout meals about once a week. He usually watches his salt at home. He is not having any new chest pressure, neck or arm discomfort. Is not having any palpitations, presyncope or syncope.   Past Medical History:  Diagnosis Date  . AICD (automatic cardioverter/defibrillator) present    s/p gen change 04/2015 w/ MDT [name] Crt-D  DTBA1D1, Serial number [id number]  . Allergic rhinitis    [name]  . Anemia   . Anxiety   . Arthritis   . Atrial flutter ([place])    A.  Status post cardioversion; B.  Tikosyn therapy - failed, remains in aflutter  . Bilateral pneumonia 11/02/2013   treated with levaquin  . BPH (benign prostatic hyperplasia)   . 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 Lucky Cowboy)  . Chronic lung disease    spirometry 2015 - no obstruction, + mild restrictive  lung disease  . Chronic sinusitis   . Chronic systolic congestive heart failure ([place])   . Dyspnea    with exertion  . Extrinsic asthma    [name]  . Fall with injury 05/28/2017  . GERD (gastroesophageal reflux disease) 2010   h/o esophageal stricture with dilation, LA grade C reflux esophagitis by EGD 2010  . Goiter   . History of diverticulitis of colon   . History of GI bleed    Secondary to hemorrhoids  . History of seasonal allergies   . Hypertension   . IBS (irritable bowel syndrome)   . LBBB (left bundle branch block)   . Multiple pulmonary nodules 06/2013   RUL ([facility] Adam [facility]ix [name]) - ?vasculitis as of last [facility] at [facility]  . NICM (nonischemic cardiomyopathy) [facility])    Cardiac catheterization March 2006 without coronary disease; EF 25% 2018 Jan  . Orthopnea   . Porphyria ([place])   . Presence of permanent cardiac pacemaker   . Sinus congestion 09/25/2010    Past Surgical History:  Procedure Laterality Date  . BACK SURGERY  1997   bulging disks  . BiV-AICD implant     [employer]  . CARDIAC CATHETERIZATION  06/22/04   Severe, nonischemic cardiomyopathy,EF 25-30%  . CARDIOVERSION  02/22/09   AFlutter-([facility])  . CATARACT EXTRACTION W/PHACO Right 12/21/2019   Procedure: CATARACT EXTRACTION PHACO AND INTRAOCULAR LENS PLACEMENT (IOC) RIGHT 2.23  00:21.9;  Surgeon: Eulogio Bear, MD;  Location: [place];  Service: Ophthalmology;  Laterality: Right;  . CATARACT EXTRACTION W/PHACO Left 02/01/2020   Procedure: CATARACT EXTRACTION PHACO AND INTRAOCULAR LENS PLACEMENT (IOC) LEFT 3.67 00:39.5;  Surgeon: Eulogio Bear, MD;  Location: [place];  Service: Ophthalmology;  Laterality: Left;  . CHOLECYSTECTOMY    . COLONOSCOPY  10/99   Divertic, splenic, hepatic fleure only  . COLONOSCOPY  10/21/08   aborted-divertics, int hemms (Dr. Vira Agar)  . CORONARY ANGIOPLASTY    . EP IMPLANTABLE DEVICE N/A 04/25/2015   Procedure: BIV ICD Generator Changeout;   Surgeon: Evans Lance, MD;  Location: [place] [facility];  Service: Cardiovascular;  Laterality: N/A;  . ESOPHAGEAL DILATION  02/18/98  . ESOPHAGOGASTRODUODENOSCOPY  01/1998   stricture, sliding HH, GERD  . ESOPHAGOGASTRODUODENOSCOPY  04/08/01   stricture, gastritis, HH, GERD-no dilation(Dr. Henrene Pastor)  . ESOPHAGOGASTRODUODENOSCOPY  12/22/04   stricture; gastritis; duodenitis, GERD  . ESOPHAGOGASTRODUODENOSCOPY  10/21/08   Reflux esophagitis; Erythem. Duod. (Dr. Vira Agar)  . ESOPHAGOGASTRODUODENOSCOPY  08/2013   erythema gastric fundus - minimal gastritis, HH ([place])  . ESOPHAGOGASTRODUODENOSCOPY  08/2016   dysphagia - mod schatzki rin, dilated Tiffany Kocher)  . ESOPHAGOGASTRODUODENOSCOPY (EGD) WITH PROPOFOL N/A 08/24/2016   mod schatzki ring dilated, duodenal deformity Vira Agar, Gavin Pound, MD)  . goiter removal    . HERNIA REPAIR    . HERNIA REPAIR  01/30/02   Dr. Hassell Done  . INSERT / REPLACE / REMOVE PACEMAKER  2007 and 2017  . JOINT REPLACEMENT Right    shoulder  . NOSE SURGERY  1971   sinus surgery  . PENILE PROSTHESIS IMPLANT  2007   Otelin  . PFT  10/2013   FVC 61%, FEV1 63%, ratio 0.76  . Pulmonary eval  04/2002   ([facility]) Chronic congestive symptoms  . REVERSE SHOULDER ARTHROPLASTY Right 01/17/2016   Procedure: REVERSE SHOULDER ARTHROPLASTY;  Surgeon: Corky Mull, MD;  Location: [facility];  Service: Orthopedics;  Laterality: Right;  . REVERSE TOTAL SHOULDER ARTHROPLASTY Right 01/2016   Poggi  . US ECHOCARDIOGRAPHY  07/13/04   EF 25-30%, Mod LVH; LA severe dilation; Mild AR; IRTR  . US ECHOCARDIOGRAPHY  11/27/06   hypokinesis posterior wall, EF 35%; mild AR     Current Outpatient Medications  Medication Sig Dispense Refill  . acetaminophen (TYLENOL) 500 MG tablet Take 250 mg by mouth daily as needed. Pain     . ALPRAZolam (XANAX) 0.25 MG tablet TAKE 1 TABLET (0.25 MG TOTAL) BY MOUTH AT BEDTIME 30 tablet 0  . alum & mag hydroxide-simeth (MAALOX/MYLANTA) 200-200-20 MG/5ML suspension Take 15  mLs by mouth daily as needed for indigestion or heartburn.    . budesonide-formoterol (SYMBICORT) 160-4.5 MCG/ACT inhaler Inhale 2 puffs into the lungs at bedtime.    . carvedilol (COREG) 6.25 MG tablet TAKE 1 AND 1/2 TABLETS BY MOUTH TWICE DAILY 270 tablet 1  . fluticasone (FLONASE) 50 MCG/ACT nasal spray Place 1 spray into both nostrils daily.    . furosemide (LASIX) 40 MG tablet Take 1.5 tablets (60 mg total) by mouth 2 (two) times daily. 140 tablet 3  . guaiFENesin 200 MG tablet Take 400 mg by mouth every 4 (four) hours as needed for cough or to loosen phlegm.    . levalbuterol (XOPENEX HFA) 45 MCG/ACT inhaler Inhale 1-2 puffs  into the lungs every 6 (six) hours as needed for wheezing or shortness of breath. 1 Inhaler 1  . Loratadine 10 MG CAPS Take 1 capsule by mouth daily as needed (allergies).     . montelukast (SINGULAIR) 10 MG tablet Take 10 mg by mouth daily as needed (BREATHING).     . polyethylene glycol (MIRALAX / GLYCOLAX) packet Take 17 g by mouth daily as needed (constipation).     . predniSONE (DELTASONE) 10 MG tablet TAKE 1 TABLET (10 MG TOTAL) BY MOUTH DAILY WITH BREAKFAST. 30 tablet 1  . predniSONE (DELTASONE) 5 MG tablet Take 1 tablet (5 mg total) by mouth 2 (two) times daily with a meal. 60 tablet 3  . tamsulosin (FLOMAX) 0.4 MG CAPS capsule TAKE 1 CAPSULE BY MOUTH EVERY DAY 90 capsule 2  . vitamin B-12 (CYANOCOBALAMIN) 500 MCG tablet Take 2 tablets (1,000 mcg total) by mouth daily.    Marland Kitchen warfarin (COUMADIN) 5 MG tablet TAKE 1/2 TO 1 TABLET BY MOUTH DAILY AS DIRECTED BY COUMADIN CLINIC 90 tablet 2   No current facility-administered medications for this visit.    Allergies:   Carafate [sucralfate], Clarithromycin, Famotidine, Levaquin [levofloxacin], Omeprazole, Oxytetracycline, Penicillins, Proton pump inhibitors, Ranitidine, and Doxycycline   ROS:  Please see the history of present illness.   Otherwise, review of systems are positive for ***  All other systems are reviewed  and negative.    PHYSICAL EXAM: VS:  There were no vitals taken for this visit. , BMI There is no height or weight on file to calculate BMI. GENERAL:  Well appearing NECK:  No jugular venous distention, waveform within normal limits, carotid upstroke brisk and symmetric, no bruits, no thyromegaly LUNGS:  Clear to auscultation bilaterally CHEST:  Well healed ICD pocket.   HEART:  PMI not displaced or sustained,S1 and S2 within normal limits, no Q5,ZD clicks, no rubs, *** murmurs, irregular  ABD:  Flat, positive bowel sounds normal in frequency in pitch, no bruits, no rebound, no guarding, no midline pulsatile mass, no hepatomegaly, no splenomegaly EXT:  2 plus pulses throughout, no edema, no cyanosis no clubbing    ***GENERAL: Mildly frail appearing NECK: Positive jugular venous distention, waveform within normal limits, carotid upstroke brisk and symmetric, no bruits, no thyromegaly LUNGS: Mildly decreased breath sounds at the bases with few crackles CHEST: Well-healed defibrillator pocket HEART:  PMI not displaced or sustained,S1 and S2 within normal limits, no S3, no clicks, no rubs, no murmurs, irregular ABD:  Flat, positive bowel sounds normal in frequency in pitch, no bruits, no rebound, no guarding, no midline pulsatile mass, positive hepatomegaly, no splenomegaly EXT:  2 plus pulses throughout, moderate bilateral edema above the ankles, dependent rubor    EKG:  EKG is *** ordered today. ***  Recent Labs: 11/16/2019: ALT 14 02/23/2020: BUN 19; Creatinine, Ser 1.13; Hemoglobin 14.6; Platelets 173; Potassium 5.8; Sodium 137    Lipid Panel    Component Value Date/Time   CHOL 183 11/06/2018 1206   CHOL 115 09/01/2013 0507   TRIG 130.0 11/06/2018 1206   TRIG 120 09/01/2013 0507   HDL 49.50 11/06/2018 1206   CHOLHDL 4 11/06/2018 1206   VLDL 26.0 11/06/2018 1206   LDLCALC 107 (H) 11/06/2018 1206      Wt Readings from Last 3 Encounters:  02/23/20 144 lb 12.8 oz (65.7 kg)   02/16/20 146 lb 12.8 oz (66.6 kg)  02/01/20 139 lb (63 kg)      Other studies Reviewed: Additional studies/ records  that were reviewed today include: ***. Review of the above records demonstrates:  ***  ASSESSMENT AND PLAN:  Acute on chronic systolic HF -  *** I am going to increase his Lasix to 60 mg twice daily. We will get a basic metabolic profile today. I had like him to come back on Friday and have another basic metabolic profile and follow-up exam.  Atrial flutter -   Mr. Ronson B Segel has a CHA2DS2 - VASc score of 3.   ***  He tolerates anticoagulation. I will check a CBC.  Pulmonary HTN - *** He wants conservative therapy with this.  ICD:  He is up-to-date with follow-up and saw Dr. Lovena Le recently.  Current medicines are reviewed at length with the patient today.  The patient does not have concerns regarding medicines.  The following changes have been made: As above  Labs/ tests ordered today include:   No orders of the defined types were placed in this encounter.    Disposition:   FU with with me or APP on Friday   Signed, [name], MD  02/25/2020 9:43 PM    [place]

## 2020-02-26 ENCOUNTER — Other Ambulatory Visit: Payer: Self-pay

## 2020-02-26 ENCOUNTER — Telehealth: Payer: Self-pay | Admitting: Cardiology

## 2020-02-26 ENCOUNTER — Ambulatory Visit: Payer: Medicare HMO | Admitting: Cardiology

## 2020-02-26 ENCOUNTER — Encounter: Payer: Self-pay | Admitting: Cardiology

## 2020-02-26 VITALS — BP 122/64 | HR 63 | Ht 64.5 in | Wt 143.2 lb

## 2020-02-26 DIAGNOSIS — I4892 Unspecified atrial flutter: Secondary | ICD-10-CM

## 2020-02-26 DIAGNOSIS — Z79899 Other long term (current) drug therapy: Secondary | ICD-10-CM | POA: Diagnosis not present

## 2020-02-26 DIAGNOSIS — I5023 Acute on chronic systolic (congestive) heart failure: Secondary | ICD-10-CM

## 2020-02-26 NOTE — Telephone Encounter (Signed)
I have received communication from [employer] regarding elevated K level of 5.8. [name] has been contacted by myself and I have discussed regarding his K level being elevated compared to his previous lab result of 5.1. He currently does not take any K supplementation and he is on Lasix. [name] is asymptomatic at this point. I have recommended that he seek out consultation with his PCP in a semi-urgent manner or urgent care clinic for EKG, repeat BMP and Potassium reduction therapies if appropriate.   He does not have worsening renal failure that would explain rising K at the moment.   I discussed with [name] and his wife [name]) and they understood my explanation and endorsed agreement with outlined plan.

## 2020-02-26 NOTE — Patient Instructions (Signed)
Medication Instructions:  No changes *If you need a refill on your cardiac medications before your next appointment, please call your pharmacy*  Lab Work: None ordered this visit If you have labs (blood work) drawn today and your tests are completely normal, you will receive your results only by: Marland Kitchen MyChart Message (if you have MyChart) OR . A paper copy in the mail If you have any lab test that is abnormal or we need to change your treatment, we will call you to review the results.  Testing/Procedures: None ordered this visit  Follow-Up: At [facility], you and your health needs are our priority.  As part of our continuing mission to provide you with exceptional heart care, we have created designated Provider Care Teams.  These Care Teams include your primary Cardiologist (physician) and Advanced Practice Providers (APPs -  Physician Assistants and Nurse Practitioners) who all work together to provide you with the care you need, when you need it.   Your next appointment:   1 month(s)  The format for your next appointment:   In Person  Provider:   Dr. [name] or any available APP

## 2020-02-26 NOTE — Progress Notes (Signed)
Cardiology Office Note   Date:  02/26/2020   ID:  WHEELER INCORVAIA, DOB 1931/10/29, MRN [id number]  PCP:  Ria Bush, MD  Cardiologist:   No primary care provider on file.   Chief Complaint  Patient presents with  . Shortness of Breath      History of Present Illness: [name] is a 86 y.o. male who presents for follow up of his cardiomyopathy and atrial flutter.  EF on the last echo that I ordered in Jan was 20 - 25%.   He called recently with increased SOB.   I saw him and increased his Lasix.  I checked basic metabolic profile and brought him back today to discuss this.    Eating better.  His lower extremity swelling is better.  He is lost about 3 pounds.  In retrospect he eats a lot of beans prepared by his daughter and might contain too much salt.  Past Medical History:  Diagnosis Date  . AICD (automatic cardioverter/defibrillator) present    s/p gen change 04/2015 w/ MDT [name] Crt-D  DTBA1D1, Serial number [id number]  . Allergic rhinitis    [name]  . Anemia   . Anxiety   . Arthritis   . Atrial flutter ([place])    A.  Status post cardioversion; B.  Tikosyn therapy - failed, remains in aflutter  . Bilateral pneumonia 11/02/2013   treated with levaquin  . BPH (benign prostatic hyperplasia)   . 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 Lucky Cowboy)  . Chronic lung disease    spirometry 2015 - no obstruction, + mild restrictive lung disease  . Chronic sinusitis   . Chronic systolic congestive heart failure ([place])   . Dyspnea    with exertion  . Extrinsic asthma    [name]  . Fall with injury 05/28/2017  . GERD (gastroesophageal reflux disease) 2010   h/o esophageal stricture with dilation, LA grade C reflux esophagitis by EGD 2010  . Goiter   . History of diverticulitis of colon   . History of GI bleed    Secondary to hemorrhoids  . History of seasonal allergies   . Hypertension   . IBS (irritable bowel syndrome)   . LBBB  (left bundle branch block)   . Multiple pulmonary nodules 06/2013   RUL ([facility]. Adam Phenix at [facility]) - ?vasculitis as of last CT at [facility]  . NICM (nonischemic cardiomyopathy) [facility])    Cardiac catheterization March 2006 without coronary disease; EF 25% 2018 Jan  . Orthopnea   . Porphyria ([place])   . Presence of permanent cardiac pacemaker   . Sinus congestion 09/25/2010    Past Surgical History:  Procedure Laterality Date  . BACK SURGERY  1997   bulging disks  . BiV-AICD implant     [employer]  . CARDIAC CATHETERIZATION  06/22/04   Severe, nonischemic cardiomyopathy,EF 25-30%  . CARDIOVERSION  02/22/09   AFlutter-([facility])  . CATARACT EXTRACTION W/PHACO Right 12/21/2019   Procedure: CATARACT EXTRACTION PHACO AND INTRAOCULAR LENS PLACEMENT (IOC) RIGHT 2.23  00:21.9;  Surgeon: Eulogio Bear, MD;  Location: [place];  Service: Ophthalmology;  Laterality: Right;  . CATARACT EXTRACTION W/PHACO Left 02/01/2020   Procedure: CATARACT EXTRACTION PHACO AND INTRAOCULAR LENS PLACEMENT (IOC) LEFT 3.67 00:39.5;  Surgeon: Eulogio Bear, MD;  Location: [place];  Service: Ophthalmology;  Laterality: Left;  . CHOLECYSTECTOMY    . COLONOSCOPY  10/99   Divertic,  splenic, hepatic fleure only  . COLONOSCOPY  10/21/08   aborted-divertics, int hemms (Dr. Vira Agar)  . CORONARY ANGIOPLASTY    . EP IMPLANTABLE DEVICE N/A 04/25/2015   Procedure: BIV ICD Generator Changeout;  Surgeon: Evans Lance, MD;  Location: [place] [facility];  Service: Cardiovascular;  Laterality: N/A;  . ESOPHAGEAL DILATION  02/18/98  . ESOPHAGOGASTRODUODENOSCOPY  01/1998   stricture, sliding HH, GERD  . ESOPHAGOGASTRODUODENOSCOPY  04/08/01   stricture, gastritis, HH, GERD-no dilation(Dr. Henrene Pastor)  . ESOPHAGOGASTRODUODENOSCOPY  12/22/04   stricture; gastritis; duodenitis, GERD  . ESOPHAGOGASTRODUODENOSCOPY  10/21/08   Reflux esophagitis; Erythem. Duod. (Dr. Vira Agar)  . ESOPHAGOGASTRODUODENOSCOPY  08/2013     erythema gastric fundus - minimal gastritis, HH ([place])  . ESOPHAGOGASTRODUODENOSCOPY  08/2016   dysphagia - mod schatzki rin, dilated Tiffany Kocher)  . ESOPHAGOGASTRODUODENOSCOPY (EGD) WITH PROPOFOL N/A 08/24/2016   mod schatzki ring dilated, duodenal deformity Vira Agar, Gavin Pound, MD)  . goiter removal    . HERNIA REPAIR    . HERNIA REPAIR  01/30/02   Dr. Hassell Done  . INSERT / REPLACE / REMOVE PACEMAKER  2007 and 2017  . JOINT REPLACEMENT Right    shoulder  . NOSE SURGERY  1971   sinus surgery  . PENILE PROSTHESIS IMPLANT  2007   Otelin  . PFT  10/2013   FVC 61%, FEV1 63%, ratio 0.76  . Pulmonary eval  04/2002   ([facility]) Chronic congestive symptoms  . REVERSE SHOULDER ARTHROPLASTY Right 01/17/2016   Procedure: REVERSE SHOULDER ARTHROPLASTY;  Surgeon: Corky Mull, MD;  Location: [facility];  Service: Orthopedics;  Laterality: Right;  . REVERSE TOTAL SHOULDER ARTHROPLASTY Right 01/2016   Poggi  . US ECHOCARDIOGRAPHY  07/13/04   EF 25-30%, Mod LVH; LA severe dilation; Mild AR; IRTR  . US ECHOCARDIOGRAPHY  11/27/06   hypokinesis posterior wall, EF 35%; mild AR     Current Outpatient Medications  Medication Sig Dispense Refill  . acetaminophen (TYLENOL) 500 MG tablet Take 250 mg by mouth daily as needed. Pain     . ALPRAZolam (XANAX) 0.25 MG tablet TAKE 1 TABLET (0.25 MG TOTAL) BY MOUTH AT BEDTIME 30 tablet 0  . alum & mag hydroxide-simeth (MAALOX/MYLANTA) 200-200-20 MG/5ML suspension Take 15 mLs by mouth daily as needed for indigestion or heartburn.    . budesonide-formoterol (SYMBICORT) 160-4.5 MCG/ACT inhaler Inhale 2 puffs into the lungs at bedtime.    . carvedilol (COREG) 6.25 MG tablet TAKE 1 AND 1/2 TABLETS BY MOUTH TWICE DAILY 270 tablet 1  . fluticasone (FLONASE) 50 MCG/ACT nasal spray Place 1 spray into both nostrils daily.    . furosemide (LASIX) 40 MG tablet Take 1.5 tablets (60 mg total) by mouth 2 (two) times daily. 140 tablet 3  . guaiFENesin 200 MG tablet Take 400 mg by mouth every  4 (four) hours as needed for cough or to loosen phlegm.    . levalbuterol (XOPENEX HFA) 45 MCG/ACT inhaler Inhale 1-2 puffs into the lungs every 6 (six) hours as needed for wheezing or shortness of breath. 1 Inhaler 1  . Loratadine 10 MG CAPS Take 1 capsule by mouth daily as needed (allergies).     . montelukast (SINGULAIR) 10 MG tablet Take 10 mg by mouth daily as needed (BREATHING).     . polyethylene glycol (MIRALAX / GLYCOLAX) packet Take 17 g by mouth daily as needed (constipation).     . predniSONE (DELTASONE) 10 MG tablet TAKE 1 TABLET (10 MG TOTAL) BY MOUTH DAILY WITH BREAKFAST.  30 tablet 1  . predniSONE (DELTASONE) 5 MG tablet Take 1 tablet (5 mg total) by mouth 2 (two) times daily with a meal. 60 tablet 3  . tamsulosin (FLOMAX) 0.4 MG CAPS capsule TAKE 1 CAPSULE BY MOUTH EVERY DAY 90 capsule 2  . vitamin B-12 (CYANOCOBALAMIN) 500 MCG tablet Take 2 tablets (1,000 mcg total) by mouth daily.    Marland Kitchen warfarin (COUMADIN) 5 MG tablet TAKE 1/2 TO 1 TABLET BY MOUTH DAILY AS DIRECTED BY COUMADIN CLINIC 90 tablet 2   No current facility-administered medications for this visit.    Allergies:   Carafate [sucralfate], Clarithromycin, Famotidine, Levaquin [levofloxacin], Omeprazole, Oxytetracycline, Penicillins, Proton pump inhibitors, Ranitidine, and Doxycycline   ROS:  Please see the history of present illness.   Otherwise, review of systems are positive for none.   All other systems are reviewed and negative.    PHYSICAL EXAM: VS:  BP 122/64   Pulse 63   Ht 5' 4.5" (1.638 m)   Wt 143 lb 3.2 oz (65 kg)   BMI 24.20 kg/m  , BMI Body mass index is 24.2 kg/m. GENERAL:  Well appearing NECK: Positive jugular venous distention, waveform within normal limits, carotid upstroke brisk and symmetric, no bruits, no thyromegaly LUNGS:  Clear to auscultation bilaterally CHEST:  Unremarkable HEART:  PMI not displaced or sustained,S1 and S2 within normal limits, no S3, no clicks, no rubs, no murmurs,  irregular ABD:  Flat, positive bowel sounds normal in frequency in pitch, no bruits, no rebound, no guarding, no midline pulsatile mass, no hepatomegaly, no splenomegaly EXT:  2 plus pulses throughout, mild bilateral lower extremity edema, no cyanosis no clubbing, dependent rubor   EKG:  EKG is not ordered today.   Recent Labs: 11/16/2019: ALT 14 02/23/2020: BUN 19; Creatinine, Ser 1.13; Hemoglobin 14.6; Platelets 173; Potassium 5.8; Sodium 137    Lipid Panel    Component Value Date/Time   CHOL 183 11/06/2018 1206   CHOL 115 09/01/2013 0507   TRIG 130.0 11/06/2018 1206   TRIG 120 09/01/2013 0507   HDL 49.50 11/06/2018 1206   CHOLHDL 4 11/06/2018 1206   VLDL 26.0 11/06/2018 1206   LDLCALC 107 (H) 11/06/2018 1206      Wt Readings from Last 3 Encounters:  02/26/20 143 lb 3.2 oz (65 kg)  02/23/20 144 lb 12.8 oz (65.7 kg)  02/16/20 146 lb 12.8 oz (66.6 kg)      Other studies Reviewed: Additional studies/ records that were reviewed today include: None. Review of the above records demonstrates:  NA  ASSESSMENT AND PLAN:  Acute on chronic systolic HF -  I am going to leave him on the 60 mg twice daily of Lasix.  He has had a basic metabolic profile drawn today and I will follow up on this.  We had another discussion about salt and fluid restriction.  His blood pressure has been low in the past and has not tolerated a lot of med titration.  I will change his meds at this point.    Atrial flutter -   Mr. Jin B Yonker has a CHA2DS2 - VASc score of 3.  He tolerates anticoagulation.  No change in therapy.   Pulmonary HTN - We are managing this conservatively with volume management.  ICD:  He is talked about wanting his ICD turned off but he is rethought that after our conversation today.  He is up-to-date with follow-up.  Current medicines are reviewed at length with the patient today.  The patient  does not have concerns regarding medicines.  The following changes have  been made: None  Labs/ tests ordered today include:   Orders Placed This Encounter  Procedures  . Basic metabolic panel     Disposition:   FU with me in 1 month   Signed, [name], MD  02/26/2020 4:20 PM    [facility]

## 2020-02-27 ENCOUNTER — Telehealth: Payer: Self-pay | Admitting: Cardiology

## 2020-02-27 ENCOUNTER — Other Ambulatory Visit: Payer: Self-pay

## 2020-02-27 ENCOUNTER — Encounter: Payer: Self-pay | Admitting: Emergency Medicine

## 2020-02-27 ENCOUNTER — Emergency Department
Admission: EM | Admit: 2020-02-27 | Discharge: 2020-02-27 | Disposition: A | Discharge status: Home / Self Care | Payer: Medicare HMO | Attending: Emergency Medicine | Admitting: Emergency Medicine

## 2020-02-27 DIAGNOSIS — Z7951 Long term (current) use of inhaled steroids: Secondary | ICD-10-CM | POA: Insufficient documentation

## 2020-02-27 DIAGNOSIS — J45909 Unspecified asthma, uncomplicated: Secondary | ICD-10-CM | POA: Diagnosis not present

## 2020-02-27 DIAGNOSIS — R799 Abnormal finding of blood chemistry, unspecified: Secondary | ICD-10-CM | POA: Diagnosis not present

## 2020-02-27 DIAGNOSIS — R899 Unspecified abnormal finding in specimens from other organs, systems and tissues: Secondary | ICD-10-CM

## 2020-02-27 DIAGNOSIS — Z96611 Presence of right artificial shoulder joint: Secondary | ICD-10-CM | POA: Insufficient documentation

## 2020-02-27 DIAGNOSIS — I110 Hypertensive heart disease with heart failure: Secondary | ICD-10-CM | POA: Insufficient documentation

## 2020-02-27 DIAGNOSIS — Z79899 Other long term (current) drug therapy: Secondary | ICD-10-CM | POA: Diagnosis not present

## 2020-02-27 DIAGNOSIS — Z7901 Long term (current) use of anticoagulants: Secondary | ICD-10-CM | POA: Diagnosis not present

## 2020-02-27 DIAGNOSIS — I5022 Chronic systolic (congestive) heart failure: Secondary | ICD-10-CM | POA: Insufficient documentation

## 2020-02-27 LAB — BASIC METABOLIC PANEL
Anion gap: 10 (ref 5–15)
BUN/Creatinine Ratio: 17 (ref 10–24)
BUN: 25 mg/dL (ref 8–27)
BUN: 27 mg/dL — ABNORMAL HIGH (ref 8–23)
CO2: 26 mmol/L (ref 20–29)
CO2: 30 mmol/L (ref 22–32)
Calcium: 8.6 mg/dL (ref 8.6–10.2)
Calcium: 8.8 mg/dL — ABNORMAL LOW (ref 8.9–10.3)
Chloride: 96 mmol/L (ref 96–106)
Chloride: 97 mmol/L — ABNORMAL LOW (ref 98–111)
Creatinine, Ser: 1.47 mg/dL — ABNORMAL HIGH (ref 0.76–1.27)
Creatinine, Ser: 1.3 mg/dL — ABNORMAL HIGH (ref 0.61–1.24)
GFR calc Af Amer: 49 mL/min/{1.73_m2} — ABNORMAL LOW (ref >59)
GFR calc non Af Amer: 43 mL/min/{1.73_m2} — ABNORMAL LOW (ref >59)
GFR, Estimated: 54 mL/min — ABNORMAL LOW (ref >60)
Glucose, Bld: 95 mg/dL (ref 70–99)
Glucose: 113 mg/dL — ABNORMAL HIGH (ref 65–99)
Potassium: 5.9 mmol/L (ref 3.5–5.2)
Potassium: 4.2 mmol/L (ref 3.5–5.1)
Sodium: 139 mmol/L (ref 134–144)
Sodium: 137 mmol/L (ref 135–145)

## 2020-02-27 LAB — CBC
HCT: 43.5 % (ref 39.0–52.0)
Hemoglobin: 14.7 g/dL (ref 13.0–17.0)
MCH: 32 pg (ref 26.0–34.0)
MCHC: 33.8 g/dL (ref 30.0–36.0)
MCV: 94.6 fL (ref 80.0–100.0)
Platelets: 168 10*3/uL (ref 150–400)
RBC: 4.6 MIL/uL (ref 4.22–5.81)
RDW: 15.9 % — ABNORMAL HIGH (ref 11.5–15.5)
WBC: 9.2 10*3/uL (ref 4.0–10.5)
nRBC: 0 % (ref 0.0–0.2)

## 2020-02-27 LAB — PROTIME-INR
INR: 2.3 — ABNORMAL HIGH (ref 0.8–1.2)
Prothrombin Time: 24.9 seconds — ABNORMAL HIGH (ref 11.4–15.2)

## 2020-02-27 NOTE — ED Triage Notes (Signed)
Pt arrived via POV with reports of elevated potassium from lab work drawn on 11/19.  Per paperwork brought in by patient K+ is 5.9. Pt states his cardiologist has been working on his fluid pill. Pt reports he has had some swelling in legs.

## 2020-02-27 NOTE — ED Provider Notes (Signed)
[facility] Emergency Department Provider Note   ____________________________________________   First MD Initiated Contact with Patient 02/27/20 [phone]     (approximate)  I have reviewed the triage vital signs and the nursing notes.   HISTORY  Chief Complaint Abnormal Lab (K+ 5.9)    HPI [name] is a 86 y.o. male with past medical history of atrial fibrillation on Coumadin, CHF (EF 20%), pulmonary hypertension, and celiac aneurysm who presents to the ED for abnormal labs.  Patient has recently been following with his cardiologist for increased leg swelling, for which his Lasix dose was doubled to 60 mg twice daily.  He had blood work rechecked yesterday and received a call at midnight that his potassium was 5.9.  He was referred to the ED for further evaluation.  He states he feels fine, report swelling in his legs is improved and he is not having any difficulty breathing.  He denies any fevers, cough, chest pain, shortness of breath, or difficulty urinating.        Past Medical History:  Diagnosis Date  . AICD (automatic cardioverter/defibrillator) present    s/p gen change 04/2015 w/ MDT [name] Crt-D  DTBA1D1, Serial number [id number]  . Allergic rhinitis    [name]  . Anemia   . Anxiety   . Arthritis   . Atrial flutter ([place])    A.  Status post cardioversion; B.  Tikosyn therapy - failed, remains in aflutter  . Bilateral pneumonia 11/02/2013   treated with levaquin  . BPH (benign prostatic hyperplasia)   . 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 Lucky Cowboy)  . Chronic lung disease    spirometry 2015 - no obstruction, + mild restrictive lung disease  . Chronic sinusitis   . Chronic systolic congestive heart failure ([place])   . Dyspnea    with exertion  . Extrinsic asthma    [name]  . Fall with injury 05/28/2017  . GERD (gastroesophageal reflux disease) 2010   h/o esophageal stricture with dilation, LA grade C reflux  esophagitis by EGD 2010  . Goiter   . History of diverticulitis of colon   . History of GI bleed    Secondary to hemorrhoids  . History of seasonal allergies   . Hypertension   . IBS (irritable bowel syndrome)   . LBBB (left bundle branch block)   . Multiple pulmonary nodules 06/2013   RUL ([facility]. Adam Phenix at [facility] and [facility]) - ?vasculitis as of last CT at [facility]  . NICM (nonischemic cardiomyopathy) [facility])    Cardiac catheterization March 2006 without coronary disease; EF 25% 2018 Jan  . Orthopnea   . Porphyria ([place])   . Presence of permanent cardiac pacemaker   . Sinus congestion 09/25/2010    Patient Active Problem List   Diagnosis Date Noted  . Chronic atrial fibrillation ([place]) 02/16/2020  . Prediabetes 11/22/2019  . Pedal edema 11/16/2019  . Decreased visual acuity 11/16/2019  . Low back pain radiating to right lower extremity 07/09/2019  . Health maintenance examination 11/11/2018  . Chronic lung disease   . Nausea 11/04/2017  . Abnormal x-ray 10/28/2017  . Pulmonary HTN ([place]) 03/25/2017  . Aortic valve regurgitation 03/25/2017  . Other fatigue 12/04/2016  . Nondisplaced fracture of greater trochanter of left femur ([place]) 10/30/2016  . Status post reverse total arthroplasty of right shoulder 01/17/2016  . Complete tear of right rotator cuff 12/10/2015  . Acute sinusitis 03/18/2015  .  Complete tear of left rotator cuff 05/07/2014  . Warfarin anticoagulation 05/07/2014  . Advanced care planning/counseling discussion 03/16/2014  . Constipation 09/09/2013  . Hyperglycemia 09/09/2013  . Medicare annual wellness visit, subsequent 04/22/2012  . 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 Left sided sciatica 06/11/2011  . Thrombocytopenia ([place]) 02/25/2011  . Vitamin B12 deficiency 02/22/2011  . Congestion of respiratory tract 09/25/2010  . Long term current use of anticoagulant 06/27/2010  . ESOPHAGEAL STRICTURE 02/16/2010  . CHEST PAIN 01/03/2010  . Atrial flutter ([place])  12/20/2008  . Anxiety 12/08/2008  . Secondary cardiomyopathy ([place]) 09/13/2008  . LBBB 09/13/2008  . Automatic implantable cardioverter-defibrillator in situ 09/13/2008  . PORPHYRIA 12/16/2006  . Chronic systolic heart failure ([place]) 12/16/2006  . Allergic rhinitis 12/16/2006  . GERD 12/16/2006  . BENIGN PROSTATIC HYPERTROPHY 12/16/2006  . IBS 12/08/1997    Past Surgical History:  Procedure Laterality Date  . BACK SURGERY  1997   bulging disks  . BiV-AICD implant     [employer]  . CARDIAC CATHETERIZATION  06/22/04   Severe, nonischemic cardiomyopathy,EF 25-30%  . CARDIOVERSION  02/22/09   AFlutter-([facility])  . CATARACT EXTRACTION W/PHACO Right 12/21/2019   Procedure: CATARACT EXTRACTION PHACO AND INTRAOCULAR LENS PLACEMENT (IOC) RIGHT 2.23  00:21.9;  Surgeon: Eulogio Bear, MD;  Location: [place];  Service: Ophthalmology;  Laterality: Right;  . CATARACT EXTRACTION W/PHACO Left 02/01/2020   Procedure: CATARACT EXTRACTION PHACO AND INTRAOCULAR LENS PLACEMENT (IOC) LEFT 3.67 00:39.5;  Surgeon: Eulogio Bear, MD;  Location: [place];  Service: Ophthalmology;  Laterality: Left;  . CHOLECYSTECTOMY    . COLONOSCOPY  10/99   Divertic, splenic, hepatic fleure only  . COLONOSCOPY  10/21/08   aborted-divertics, int hemms (Dr. Vira Agar)  . CORONARY ANGIOPLASTY    . EP IMPLANTABLE DEVICE N/A 04/25/2015   Procedure: BIV ICD Generator Changeout;  Surgeon: Evans Lance, MD;  Location: [place] [facility];  Service: Cardiovascular;  Laterality: N/A;  . ESOPHAGEAL DILATION  02/18/98  . ESOPHAGOGASTRODUODENOSCOPY  01/1998   stricture, sliding HH, GERD  . ESOPHAGOGASTRODUODENOSCOPY  04/08/01   stricture, gastritis, HH, GERD-no dilation(Dr. Henrene Pastor)  . ESOPHAGOGASTRODUODENOSCOPY  12/22/04   stricture; gastritis; duodenitis, GERD  . ESOPHAGOGASTRODUODENOSCOPY  10/21/08   Reflux esophagitis; Erythem. Duod. (Dr. Vira Agar)  . ESOPHAGOGASTRODUODENOSCOPY  08/2013   erythema gastric  fundus - minimal gastritis, HH ([place])  . ESOPHAGOGASTRODUODENOSCOPY  08/2016   dysphagia - mod schatzki rin, dilated Tiffany Kocher)  . ESOPHAGOGASTRODUODENOSCOPY (EGD) WITH PROPOFOL N/A 08/24/2016   mod schatzki ring dilated, duodenal deformity Vira Agar, Gavin Pound, MD)  . goiter removal    . HERNIA REPAIR    . HERNIA REPAIR  01/30/02   Dr. Hassell Done  . INSERT / REPLACE / REMOVE PACEMAKER  2007 and 2017  . JOINT REPLACEMENT Right    shoulder  . NOSE SURGERY  1971   sinus surgery  . PENILE PROSTHESIS IMPLANT  2007   [place]  . PFT  10/2013   FVC 61%, FEV1 63%, ratio 0.76  . Pulmonary eval  04/2002   ([facility]) Chronic congestive symptoms  . REVERSE SHOULDER ARTHROPLASTY Right 01/17/2016   Procedure: REVERSE SHOULDER ARTHROPLASTY;  Surgeon: Corky Mull, MD;  Location: [facility];  Service: Orthopedics;  Laterality: Right;  . REVERSE TOTAL SHOULDER ARTHROPLASTY Right 01/2016   Poggi  . US ECHOCARDIOGRAPHY  07/13/04   EF 25-30%, Mod LVH; LA severe dilation; Mild AR; IRTR  . US ECHOCARDIOGRAPHY  11/27/06   hypokinesis posterior wall, EF  35%; mild AR    Prior to Admission medications   Medication Sig Start Date End Date Taking? Authorizing Provider  acetaminophen (TYLENOL) 500 MG tablet Take 250 mg by mouth daily as needed. Pain     [provider]  ALPRAZolam Duanne Moron) 0.25 MG tablet TAKE 1 TABLET (0.25 MG TOTAL) BY MOUTH AT BEDTIME 01/20/20   Ria Bush, MD  alum & mag hydroxide-simeth (MAALOX/MYLANTA) 200-200-20 MG/5ML suspension Take 15 mLs by mouth daily as needed for indigestion or heartburn.    [provider]  budesonide-formoterol (SYMBICORT) 160-4.5 MCG/ACT inhaler Inhale 2 puffs into the lungs at bedtime.    [provider]  carvedilol (COREG) 6.25 MG tablet TAKE 1 AND 1/2 TABLETS BY MOUTH TWICE DAILY 11/23/19   [name], MD  fluticasone (FLONASE) 50 MCG/ACT nasal spray Place 1 spray into both nostrils daily.    [provider]  furosemide (LASIX) 40  MG tablet Take 1.5 tablets (60 mg total) by mouth 2 (two) times daily. 02/23/20 05/23/20  [name], MD  guaiFENesin 200 MG tablet Take 400 mg by mouth every 4 (four) hours as needed for cough or to loosen phlegm.    [provider]  levalbuterol Penne Lash HFA) 45 MCG/ACT inhaler Inhale 1-2 puffs into the lungs every 6 (six) hours as needed for wheezing or shortness of breath. 08/31/14   Ria Bush, MD  Loratadine 10 MG CAPS Take 1 capsule by mouth daily as needed (allergies).     [provider]  montelukast (SINGULAIR) 10 MG tablet Take 10 mg by mouth daily as needed (BREATHING).     [provider]  polyethylene glycol (MIRALAX / GLYCOLAX) packet Take 17 g by mouth daily as needed (constipation).     [provider]  predniSONE (DELTASONE) 10 MG tablet TAKE 1 TABLET (10 MG TOTAL) BY MOUTH DAILY WITH BREAKFAST. 02/23/20   Laurin Coder, MD  predniSONE (DELTASONE) 5 MG tablet Take 1 tablet (5 mg total) by mouth 2 (two) times daily with a meal. 12/10/19   Olalere, Adewale A, MD  tamsulosin (FLOMAX) 0.4 MG CAPS capsule TAKE 1 CAPSULE BY MOUTH EVERY DAY 10/26/19   Billey Co, MD  vitamin B-12 (CYANOCOBALAMIN) 500 MCG tablet Take 2 tablets (1,000 mcg total) by mouth daily. 11/16/19   Ria Bush, MD  warfarin (COUMADIN) 5 MG tablet TAKE 1/2 TO 1 TABLET BY MOUTH DAILY AS DIRECTED BY COUMADIN CLINIC 12/30/19   Evans Lance, MD    Allergies Carafate [sucralfate], Clarithromycin, Famotidine, Levaquin [levofloxacin], Omeprazole, Oxytetracycline, Penicillins, Proton pump inhibitors, Ranitidine, and Doxycycline  Family History  Problem Relation Age of Onset  . Coronary artery disease Father   . Hypertension Father   . Heart failure Mother   . Dysphagia Sister   . Breast cancer Other   . Ovarian cancer Other   . Uterine cancer Other   . Other Sister        stomach problems  . Other Sister        stomach problems  . Other Sister         stomach problems  . Alcohol abuse Maternal Uncle     Social History Social History   Tobacco Use  . Smoking status: Never Smoker  . Smokeless tobacco: Never Used  Vaping Use  . Vaping Use: Never used  Substance Use Topics  . Alcohol use: No  . Drug use: No    Review of Systems  Constitutional: No fever/chills.  Positive for abnormal  labs. Eyes: No visual changes. ENT: No sore throat. Cardiovascular: Denies chest pain. Respiratory: Denies shortness of breath. Gastrointestinal: No abdominal pain.  No nausea, no vomiting.  No diarrhea.  No constipation. Genitourinary: Negative for dysuria. Musculoskeletal: Negative for back pain. Skin: Negative for rash. Neurological: Negative for headaches, focal weakness or numbness.  ____________________________________________   PHYSICAL EXAM:  VITAL SIGNS: ED Triage Vitals [02/27/20 0923]  Enc Vitals Group     BP 124/72     Pulse Rate 77     Resp 18     Temp      Temp Source Oral     SpO2 99 %     Weight      Height      Head Circumference      Peak Flow      Pain Score 0     Pain Loc      Pain Edu?      Excl. in [place]?     Constitutional: Alert and oriented. Eyes: Conjunctivae are normal. Head: Atraumatic. Nose: No congestion/rhinnorhea. Mouth/Throat: Mucous membranes are moist. Neck: Normal ROM Cardiovascular: Normal rate, regular rhythm. Grossly normal heart sounds. Respiratory: Normal respiratory effort.  No retractions. Lungs CTAB. Gastrointestinal: Soft and nontender. No distention. Genitourinary: deferred Musculoskeletal: No lower extremity tenderness nor edema. Neurologic:  Normal speech and language. No gross focal neurologic deficits are appreciated. Skin:  Skin is warm, dry and intact. No rash noted. Psychiatric: Mood and affect are normal. Speech and behavior are normal.  ____________________________________________   LABS (all labs ordered are listed, but only abnormal results are displayed)  Labs  Reviewed  BASIC METABOLIC PANEL - Abnormal; Notable for the following components:      Result Value   Chloride 97 (*)    BUN 27 (*)    Creatinine, Ser 1.30 (*)    Calcium 8.8 (*)    GFR, Estimated 54 (*)    All other components within normal limits  CBC - Abnormal; Notable for the following components:   RDW 15.9 (*)    All other components within normal limits  PROTIME-INR - Abnormal; Notable for the following components:   Prothrombin Time 24.9 (*)    INR 2.3 (*)    All other components within normal limits   ____________________________________________  EKG  ED ECG REPORT I, Blake Divine, the attending physician, personally viewed and interpreted this ECG.   Date: 02/27/2020  EKG Time: 9:24  Rate: 62  Rhythm: Ventricular paced  Axis: Normal  Intervals:nonspecific intraventricular conduction delay  ST&T Change: None   PROCEDURES  Procedure(s) performed (including Critical Care):  Procedures   ____________________________________________   INITIAL IMPRESSION / ASSESSMENT AND PLAN / ED COURSE       86 year old male with past medical history of atrial fibrillation on Coumadin, CHF, pulmonary hypertension, and celiac aneurysm who presents to the ED for abnormal high potassium noted on routine labs at his cardiologist office yesterday.  He denies any complaints at this time, EKG shows ventricular paced rhythm similar to previous with no potential changes related to hyperkalemia.  Repeat labs show normal potassium at 4.2, it is possible that previously elevated levels were due to hemolysis.  Remainder of labs are unremarkable, INR therapeutic, and patient is appropriate for discharge home with cardiology and PCP follow-up.      ____________________________________________   FINAL CLINICAL IMPRESSION(S) / ED DIAGNOSES  Final diagnoses:  Chronic systolic congestive heart failure (HCC)  Abnormal laboratory test result     ED Discharge  Orders    None        Note:  This document was prepared using Dragon voice recognition software and may include unintentional dictation errors.   Blake Divine, MD 02/27/20 1114

## 2020-02-27 NOTE — ED Notes (Signed)
Pt in room with granddaughter. Pt states "feels fine". Last potassium level drawn here was wnl. Pt does not want IV placed for repeated lab tests.

## 2020-02-27 NOTE — Telephone Encounter (Signed)
Received outpatient page from patient's son stating that he has gained weight and has lower extremity swelling. He was seen by Dr. [name] on 02/23/2020 at which time his Lasix was increased to 60 mg twice daily due to edema. Lab work at that time was stable however potassium was noted to be 5.1.  He was then seen 02/26/2020 at which time his Lasix was continued at the current dose. Repeat lab work showed an elevated creatinine at 1.47 up from 1.13. Out of concern, the patient's family took him to an urgent care center where he is at at this time.  Discussed plan with patient's son.  He has a follow-up 04/11/2020 however I will message our scheduling team to move this much closer given the above symptoms.  We will let the urgent care team manage him at this time.  If there are questions, son is to call us back otherwise we will schedule him for close follow-up.  Kathyrn Drown NP-C [place] Pager: [phone]

## 2020-02-27 NOTE — ED Notes (Signed)
Patient was called at midnight and told his potassium was high and to go to the ER. Presents with paperwork and K+ indicates a value of 5.9

## 2020-02-29 NOTE — Telephone Encounter (Signed)
Spoke with pt, aware his potassium at the urgent care was normal but his kidney function is a little elevated. Will forward to dr hochrein to see when the next lab work is needed.

## 2020-02-29 NOTE — Telephone Encounter (Signed)
Patient is calling to follow up. He is somewhat confused. He stated he initially received a call from our office or 02/26/20 to discuss repeat lab work due to low potassium. Was also advised to go to urgent care and he has done so. He states he is not sure whether or not he still needs to have additional labs drawn. Please return call to discuss.

## 2020-03-01 ENCOUNTER — Other Ambulatory Visit: Payer: Medicare HMO

## 2020-03-09 ENCOUNTER — Ambulatory Visit: Payer: Medicare HMO

## 2020-03-09 ENCOUNTER — Other Ambulatory Visit: Payer: Self-pay

## 2020-03-09 DIAGNOSIS — Z5181 Encounter for therapeutic drug level monitoring: Secondary | ICD-10-CM | POA: Diagnosis not present

## 2020-03-09 DIAGNOSIS — I4892 Unspecified atrial flutter: Secondary | ICD-10-CM | POA: Diagnosis not present

## 2020-03-09 DIAGNOSIS — Z7901 Long term (current) use of anticoagulants: Secondary | ICD-10-CM | POA: Diagnosis not present

## 2020-03-09 LAB — POCT INR: INR: 2.5 (ref 2.0–3.0)

## 2020-03-09 NOTE — Patient Instructions (Signed)
-   continue dosage of warfarin of 1 tablet every day EXCEPT 1/2 TABLET ON MONDAYS. - recheck in 6 weeks.  BE CONSISTENT WITH YOUR GREENS INTAKE - pick a day or days to have your greens and do that every week.

## 2020-03-10 DIAGNOSIS — Z961 Presence of intraocular lens: Secondary | ICD-10-CM | POA: Diagnosis not present

## 2020-03-22 NOTE — Progress Notes (Signed)
Cardiology Office Note   Date:  03/23/2020   ID:  KIING DEAKIN, DOB [birth year], MRN [id number]  PCP:  Ria Bush, MD  Cardiologist:   [name], MD   Chief Complaint  Patient presents with  . Shortness of Breath  . Leg Swelling      History of Present Illness: [name] is a 86 y.o. male who presents for follow up of his cardiomyopathy and atrial flutter.  EF on the last echo that I ordered in Jan was 20 - 25%.   I saw him this week. He had increased SOB and edema.  I increased his diuretic.  He returns for follow up.  He is breathing better than he was.  He still has some lower extremity swelling.  His weight seems to be back down.  He is about 139 is his baseline although he probably was a little bit lighter than this previously.  It hovers between 138 and 140.  He does not have the right upper quadrant discomfort that he was having.  He is not having the breathlessness that he was having.  He is not having any PND or orthopnea.  He has no chest pressure, neck or arm discomfort.  Past Medical History:  Diagnosis Date  . AICD (automatic cardioverter/defibrillator) present    s/p gen change 04/2015 w/ MDT [name] Crt-D  DTBA1D1, Serial number [id number]  . Allergic rhinitis    [name]  . Anemia   . Anxiety   . Arthritis   . Atrial flutter ([place])    A.  Status post cardioversion; B.  Tikosyn therapy - failed, remains in aflutter  . Bilateral pneumonia 11/02/2013   treated with levaquin  . BPH (benign prostatic hyperplasia)   . 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 Lucky Cowboy)  . Chronic lung disease    spirometry 2015 - no obstruction, + mild restrictive lung disease  . Chronic sinusitis   . Chronic systolic congestive heart failure ([place])   . Dyspnea    with exertion  . Extrinsic asthma    [name]  . Fall with injury 05/28/2017  . GERD (gastroesophageal reflux disease) 2010   h/o esophageal stricture with dilation, LA  grade C reflux esophagitis by EGD 2010  . Goiter   . History of diverticulitis of colon   . History of GI bleed    Secondary to hemorrhoids  . History of seasonal allergies   . Hypertension   . IBS (irritable bowel syndrome)   . LBBB (left bundle branch block)   . Multiple pulmonary nodules 06/2013   RUL (Dr. Adam Phenix at [facility] and [facility]) - ?vasculitis as of last CT at [facility]  . NICM (nonischemic cardiomyopathy) [facility])    Cardiac catheterization March 2006 without coronary disease; EF 25% 2018 Jan  . Orthopnea   . Porphyria ([place])   . Presence of permanent cardiac pacemaker   . Sinus congestion 09/25/2010    Past Surgical History:  Procedure Laterality Date  . BACK SURGERY  1997   bulging disks  . BiV-AICD implant     [employer]  . CARDIAC CATHETERIZATION  06/22/04   Severe, nonischemic cardiomyopathy,EF 25-30%  . CARDIOVERSION  02/22/09   AFlutter-([facility])  . CATARACT EXTRACTION W/PHACO Right 12/21/2019   Procedure: CATARACT EXTRACTION PHACO AND INTRAOCULAR LENS PLACEMENT (IOC) RIGHT 2.23  00:21.9;  Surgeon: Eulogio Bear, MD;  Location: [facility];  Service: Ophthalmology;  Laterality: Right;  .  CATARACT EXTRACTION W/PHACO Left 02/01/2020   Procedure: CATARACT EXTRACTION PHACO AND INTRAOCULAR LENS PLACEMENT (IOC) LEFT 3.67 00:39.5;  Surgeon: Eulogio Bear, MD;  Location: [facility];  Service: Ophthalmology;  Laterality: Left;  . CHOLECYSTECTOMY    . COLONOSCOPY  10/99   Divertic, splenic, hepatic fleure only  . COLONOSCOPY  10/21/08   aborted-divertics, int hemms (Dr. Vira Agar)  . CORONARY ANGIOPLASTY    . EP IMPLANTABLE DEVICE N/A 04/25/2015   Procedure: BIV ICD Generator Changeout;  Surgeon: Evans Lance, MD;  Location: [facility];  Service: Cardiovascular;  Laterality: N/A;  . ESOPHAGEAL DILATION  02/18/98  . ESOPHAGOGASTRODUODENOSCOPY  01/1998   stricture, sliding HH, GERD  . ESOPHAGOGASTRODUODENOSCOPY  04/08/01   stricture, gastritis, HH,  GERD-no dilation(Dr. Henrene Pastor)  . ESOPHAGOGASTRODUODENOSCOPY  12/22/04   stricture; gastritis; duodenitis, GERD  . ESOPHAGOGASTRODUODENOSCOPY  10/21/08   Reflux esophagitis; Erythem. Duod. (Dr. Vira Agar)  . ESOPHAGOGASTRODUODENOSCOPY  08/2013   erythema gastric fundus - minimal gastritis, HH ([place])  . ESOPHAGOGASTRODUODENOSCOPY  08/2016   dysphagia - mod schatzki rin, dilated Tiffany Kocher)  . ESOPHAGOGASTRODUODENOSCOPY (EGD) WITH PROPOFOL N/A 08/24/2016   mod schatzki ring dilated, duodenal deformity Vira Agar, Gavin Pound, MD)  . goiter removal    . HERNIA REPAIR    . HERNIA REPAIR  01/30/02   Dr. Hassell Done  . INSERT / REPLACE / REMOVE PACEMAKER  2007 and 2017  . JOINT REPLACEMENT Right    shoulder  . NOSE SURGERY  1971   sinus surgery  . PENILE PROSTHESIS IMPLANT  2007   [place]  . PFT  10/2013   FVC 61%, FEV1 63%, ratio 0.76  . Pulmonary eval  04/2002   ([facility]) Chronic congestive symptoms  . REVERSE SHOULDER ARTHROPLASTY Right 01/17/2016   Procedure: REVERSE SHOULDER ARTHROPLASTY;  Surgeon: Corky Mull, MD;  Location: [facility];  Service: Orthopedics;  Laterality: Right;  . REVERSE TOTAL SHOULDER ARTHROPLASTY Right 01/2016   [place]  . US ECHOCARDIOGRAPHY  07/13/04   EF 25-30%, Mod LVH; LA severe dilation; Mild AR; IRTR  . US ECHOCARDIOGRAPHY  11/27/06   hypokinesis posterior wall, EF 35%; mild AR     Current Outpatient Medications  Medication Sig Dispense Refill  . acetaminophen (TYLENOL) 500 MG tablet Take 250 mg by mouth daily as needed. Pain     . ALPRAZolam (XANAX) 0.25 MG tablet TAKE 1 TABLET (0.25 MG TOTAL) BY MOUTH AT BEDTIME 30 tablet 0  . alum & mag hydroxide-simeth (MAALOX/MYLANTA) 200-200-20 MG/5ML suspension Take 15 mLs by mouth daily as needed for indigestion or heartburn.    . budesonide-formoterol (SYMBICORT) 160-4.5 MCG/ACT inhaler Inhale 2 puffs into the lungs at bedtime.    . carvedilol (COREG) 6.25 MG tablet TAKE 1 AND 1/2 TABLETS BY MOUTH TWICE DAILY 270 tablet 1  . fluticasone  (FLONASE) 50 MCG/ACT nasal spray Place 1 spray into both nostrils daily.    . furosemide (LASIX) 40 MG tablet Take 1.5 tablets (60 mg total) by mouth 2 (two) times daily. 140 tablet 3  . guaiFENesin 200 MG tablet Take 400 mg by mouth every 4 (four) hours as needed for cough or to loosen phlegm.    . levalbuterol (XOPENEX HFA) 45 MCG/ACT inhaler Inhale 1-2 puffs into the lungs every 6 (six) hours as needed for wheezing or shortness of breath. 1 Inhaler 1  . Loratadine 10 MG CAPS Take 1 capsule by mouth daily as needed (allergies).    . montelukast (SINGULAIR) 10 MG tablet Take 10 mg  by mouth daily as needed (BREATHING).     . polyethylene glycol (MIRALAX / GLYCOLAX) packet Take 17 g by mouth daily as needed (constipation).     . predniSONE (DELTASONE) 5 MG tablet Take 1 tablet (5 mg total) by mouth 2 (two) times daily with a meal. 60 tablet 3  . tamsulosin (FLOMAX) 0.4 MG CAPS capsule TAKE 1 CAPSULE BY MOUTH EVERY DAY 90 capsule 2  . vitamin B-12 (CYANOCOBALAMIN) 500 MCG tablet Take 2 tablets (1,000 mcg total) by mouth daily.    Marland Kitchen warfarin (COUMADIN) 5 MG tablet TAKE 1/2 TO 1 TABLET BY MOUTH DAILY AS DIRECTED BY COUMADIN CLINIC 90 tablet 2   No current facility-administered medications for this visit.    Allergies:   Carafate [sucralfate], Clarithromycin, Famotidine, Levaquin [levofloxacin], Omeprazole, Oxytetracycline, Penicillins, Proton pump inhibitors, Ranitidine, and Doxycycline   ROS:  Please see the history of present illness.   Otherwise, review of systems are positive for none  All other systems are reviewed and negative.    PHYSICAL EXAM: VS:  BP 118/62   Pulse 63   Temp (!) 97 F (36.1 C)   Ht 5\' 4"  (1.626 m)   Wt 142 lb 6.4 oz (64.6 kg)   SpO2 99%   BMI 24.44 kg/m  , BMI Body mass index is 24.44 kg/m. GENERAL:  Well appearing NECK: Positive jugular venous distention, CV waveform, carotid upstroke brisk and symmetric, no bruits, no thyromegaly LUNGS:  Clear to auscultation  bilaterally CHEST:  Well healed ICD pocket.   HEART:  PMI not displaced or sustained,S1 and S2 within normal limits, no H8,DU clicks, no rubs, 2 out of 6 holosystolic murmur at the third left intercostal space, no diastolic murmurs, irregular  ABD:  Flat, positive bowel sounds normal in frequency in pitch, no bruits, no rebound, no guarding, no midline pulsatile mass, no hepatomegaly, no splenomegaly EXT:  2 plus pulses throughout, mild edema, no cyanosis no clubbing, dependent rubor   EKG:  EKG is not ordered today.   Recent Labs: 11/16/2019: ALT 14 02/27/2020: BUN 27; Creatinine, Ser 1.30; Hemoglobin 14.7; Platelets 168; Potassium 4.2; Sodium 137    Lipid Panel    Component Value Date/Time   CHOL 183 11/06/2018 1206   CHOL 115 09/01/2013 0507   TRIG 130.0 11/06/2018 1206   TRIG 120 09/01/2013 0507   HDL 49.50 11/06/2018 1206   CHOLHDL 4 11/06/2018 1206   VLDL 26.0 11/06/2018 1206   LDLCALC 107 (H) 11/06/2018 1206      Wt Readings from Last 3 Encounters:  03/23/20 142 lb 6.4 oz (64.6 kg)  02/26/20 143 lb 3.2 oz (65 kg)  02/23/20 144 lb 12.8 oz (65.7 kg)      Other studies Reviewed: Additional studies/ records that were reviewed today include: Labs. Review of the above records demonstrates:  See elsewhere  ASSESSMENT AND PLAN:  Acute on chronic systolic HF -  Today he seems to be relatively euvolemic.  He is going to continue the Lasix 60 mg twice daily.  I will get a basic metabolic profile in a couple of weeks in [place].    Atrial flutter -   Mr. Emeril B Mitton has a CHA2DS2 - VASc score of 3.   He tolerates anticoagulation.  He is not interested in a DOAC.   Pulmonary HTN - He understands he has pulmonary hypertension.  It is severe.  However, he would not want any advanced therapies or referrals.   ICD:  He is up-to-date with  follow-up.    Current medicines are reviewed at length with the patient today.  The patient does not have concerns regarding  medicines.  The following changes have been made:  None  Labs/ tests ordered today include:  None Orders Placed This Encounter  Procedures  . Basic metabolic panel     Disposition:   FU with with me in 4 months.    Signed, [name], MD  03/23/2020 2:58 PM    [place]

## 2020-03-23 ENCOUNTER — Other Ambulatory Visit: Payer: Self-pay

## 2020-03-23 ENCOUNTER — Encounter: Payer: Self-pay | Admitting: Cardiology

## 2020-03-23 ENCOUNTER — Ambulatory Visit (INDEPENDENT_AMBULATORY_CARE_PROVIDER_SITE_OTHER): Payer: Medicare HMO

## 2020-03-23 ENCOUNTER — Ambulatory Visit: Payer: Medicare HMO | Admitting: Cardiology

## 2020-03-23 VITALS — BP 118/62 | HR 63 | Temp 97.0°F | Ht 64.0 in | Wt 142.4 lb

## 2020-03-23 DIAGNOSIS — I4892 Unspecified atrial flutter: Secondary | ICD-10-CM | POA: Diagnosis not present

## 2020-03-23 DIAGNOSIS — I272 Pulmonary hypertension, unspecified: Secondary | ICD-10-CM | POA: Diagnosis not present

## 2020-03-23 DIAGNOSIS — I5023 Acute on chronic systolic (congestive) heart failure: Secondary | ICD-10-CM

## 2020-03-23 DIAGNOSIS — I5021 Acute systolic (congestive) heart failure: Secondary | ICD-10-CM | POA: Diagnosis not present

## 2020-03-23 LAB — CUP PACEART REMOTE DEVICE CHECK
Battery Remaining Longevity: 16 mo
Battery Voltage: 2.9 V
Brady Statistic RA Percent Paced: 0 %
Brady Statistic RV Percent Paced: 94.38 %
Date Time Interrogation Session: 20211215033424
HighPow Impedance: 41 Ohm
HighPow Impedance: 50 Ohm
Implantable Lead Implant Date: 20070525
Implantable Lead Implant Date: 20070525
Implantable Lead Implant Date: 20070525
Implantable Lead Location: 753858
Implantable Lead Location: 753859
Implantable Lead Location: 753860
Implantable Lead Model: 4194
Implantable Lead Model: 5076
Implantable Lead Model: 6949
Implantable Pulse Generator Implant Date: 20170116
Lead Channel Impedance Value: 190 Ohm
Lead Channel Impedance Value: 304 Ohm
Lead Channel Impedance Value: 361 Ohm
Lead Channel Impedance Value: 418 Ohm
Lead Channel Impedance Value: 456 Ohm
Lead Channel Impedance Value: 475 Ohm
Lead Channel Pacing Threshold Amplitude: 0.75 V
Lead Channel Pacing Threshold Amplitude: 0.75 V
Lead Channel Pacing Threshold Pulse Width: 0.4 ms
Lead Channel Pacing Threshold Pulse Width: 0.4 ms
Lead Channel Sensing Intrinsic Amplitude: 12.125 mV
Lead Channel Sensing Intrinsic Amplitude: 12.125 mV
Lead Channel Sensing Intrinsic Amplitude: 2.375 mV
Lead Channel Sensing Intrinsic Amplitude: 2.375 mV
Lead Channel Setting Pacing Amplitude: 2 V
Lead Channel Setting Pacing Amplitude: 2 V
Lead Channel Setting Pacing Amplitude: 2 V
Lead Channel Setting Pacing Pulse Width: 0.4 ms
Lead Channel Setting Pacing Pulse Width: 0.4 ms
Lead Channel Setting Sensing Sensitivity: 0.45 mV

## 2020-03-23 NOTE — Patient Instructions (Signed)
Medication Instructions:  No changes *If you need a refill on your cardiac medications before your next appointment, please call your pharmacy*  Lab Work: Your physician recommends that you return to [place] for lab work in January [facility])  Testing/Procedures: No changes  Follow-Up: At [facility], you and your health needs are our priority.  As part of our continuing mission to provide you with exceptional heart care, we have created designated Provider Care Teams.  These Care Teams include your primary Cardiologist (physician) and Advanced Practice Providers (APPs -  Physician Assistants and Nurse Practitioners) who all work together to provide you with the care you need, when you need it.  Your next appointment:   4 month(s) - call in January to schedule for April  The format for your next appointment:   In Person  Provider:   [name], MD

## 2020-04-04 ENCOUNTER — Other Ambulatory Visit: Payer: Self-pay | Admitting: Cardiology

## 2020-04-04 ENCOUNTER — Other Ambulatory Visit: Payer: Self-pay | Admitting: Family Medicine

## 2020-04-04 NOTE — Telephone Encounter (Signed)
ERx 

## 2020-04-06 NOTE — Progress Notes (Signed)
Remote ICD transmission.   

## 2020-04-11 ENCOUNTER — Ambulatory Visit: Payer: Medicare HMO | Admitting: Cardiology

## 2020-04-14 DIAGNOSIS — R0600 Dyspnea, unspecified: Secondary | ICD-10-CM | POA: Diagnosis not present

## 2020-04-14 DIAGNOSIS — R0689 Other abnormalities of breathing: Secondary | ICD-10-CM | POA: Diagnosis not present

## 2020-04-14 DIAGNOSIS — J90 Pleural effusion, not elsewhere classified: Secondary | ICD-10-CM | POA: Diagnosis not present

## 2020-04-14 DIAGNOSIS — I517 Cardiomegaly: Secondary | ICD-10-CM | POA: Diagnosis not present

## 2020-04-18 DIAGNOSIS — R0689 Other abnormalities of breathing: Secondary | ICD-10-CM | POA: Diagnosis not present

## 2020-04-18 DIAGNOSIS — R0600 Dyspnea, unspecified: Secondary | ICD-10-CM | POA: Diagnosis not present

## 2020-04-19 DIAGNOSIS — J019 Acute sinusitis, unspecified: Secondary | ICD-10-CM | POA: Diagnosis not present

## 2020-04-20 ENCOUNTER — Telehealth: Payer: Self-pay

## 2020-04-20 NOTE — Telephone Encounter (Signed)
Left message for pt to call back  °

## 2020-04-20 NOTE — Telephone Encounter (Signed)
Patient recently placed on antibiotic and would like to be called to discuss how to properly take it along with coumadin

## 2020-04-20 NOTE — Telephone Encounter (Signed)
Spoke w/ pt.  He reports that his ENT doctor started him on a 10 day course of Levaquin. He reports that Levaquin normally interferes w/ his INR, so he would like to reduce his dosage of warfarin down to 1/2 tablet a day until he can come in for INR check. Advised pt that I am agreeable to this, especially since he is not eating in addition to being on abx.  Scheduled him appt for next Wed. Pt is audibly SOB on the phone.  He is appreciative and will call back w/ any further questions or concerns.

## 2020-04-22 ENCOUNTER — Other Ambulatory Visit: Payer: Self-pay | Admitting: Pulmonary Disease

## 2020-04-27 ENCOUNTER — Ambulatory Visit (INDEPENDENT_AMBULATORY_CARE_PROVIDER_SITE_OTHER): Payer: Medicare HMO

## 2020-04-27 ENCOUNTER — Other Ambulatory Visit: Payer: Self-pay

## 2020-04-27 DIAGNOSIS — Z5181 Encounter for therapeutic drug level monitoring: Secondary | ICD-10-CM

## 2020-04-27 DIAGNOSIS — Z7901 Long term (current) use of anticoagulants: Secondary | ICD-10-CM

## 2020-04-27 DIAGNOSIS — I4892 Unspecified atrial flutter: Secondary | ICD-10-CM | POA: Diagnosis not present

## 2020-04-27 LAB — POCT INR: INR: 1.8 — AB (ref 2.0–3.0)

## 2020-04-27 NOTE — Patient Instructions (Signed)
-   resume dosage of warfarin of 1 tablet every day EXCEPT 1/2 TABLET ON MONDAYS. - recheck in 4 weeks.  BE CONSISTENT WITH YOUR GREENS INTAKE - pick a day or days to have your greens and do that every week.

## 2020-05-02 DIAGNOSIS — I4892 Unspecified atrial flutter: Secondary | ICD-10-CM | POA: Diagnosis not present

## 2020-05-02 DIAGNOSIS — I272 Pulmonary hypertension, unspecified: Secondary | ICD-10-CM | POA: Diagnosis not present

## 2020-05-02 DIAGNOSIS — I5021 Acute systolic (congestive) heart failure: Secondary | ICD-10-CM | POA: Diagnosis not present

## 2020-05-03 LAB — BASIC METABOLIC PANEL
BUN/Creatinine Ratio: 20 (ref 10–24)
BUN: 26 mg/dL (ref 8–27)
CO2: 27 mmol/L (ref 20–29)
Calcium: 8.8 mg/dL (ref 8.6–10.2)
Chloride: 98 mmol/L (ref 96–106)
Creatinine, Ser: 1.29 mg/dL — ABNORMAL HIGH (ref 0.76–1.27)
GFR calc Af Amer: 58 mL/min/{1.73_m2} — ABNORMAL LOW (ref >59)
GFR calc non Af Amer: 50 mL/min/{1.73_m2} — ABNORMAL LOW (ref >59)
Glucose: 66 mg/dL (ref 65–99)
Potassium: 4.6 mmol/L (ref 3.5–5.2)
Sodium: 138 mmol/L (ref 134–144)

## 2020-05-06 ENCOUNTER — Telehealth: Payer: Self-pay | Admitting: Cardiology

## 2020-05-06 NOTE — Telephone Encounter (Signed)
Spoke with daughter per [facility], she will relay the message to patient and spouse. Labs are okay. Understanding verbalized.

## 2020-05-06 NOTE — Telephone Encounter (Signed)
Patient's wife returning call for lab results. 

## 2020-05-09 ENCOUNTER — Telehealth: Payer: Self-pay

## 2020-05-09 NOTE — Telephone Encounter (Signed)
Patient has been on prednisone 60mg , 50 ,40, 30, 20, 10 taper starting saturday He took 1/2 tablet of warfarin on Saturday and a whole tablet on Sunday. I advised he should continue his normal dose (1/2 tab tonight, whole tomorrow) and I scheduled him in the coumadin clinic in [place] on Wed.

## 2020-05-09 NOTE — Telephone Encounter (Signed)
Patient calling in to ask about any changes that may need to be made as patient is taking prednisone. Patient states he has changed some things by himself but wants to see what is most appropriate

## 2020-05-11 ENCOUNTER — Other Ambulatory Visit: Payer: Self-pay

## 2020-05-11 ENCOUNTER — Ambulatory Visit: Payer: Medicare HMO

## 2020-05-11 DIAGNOSIS — Z7901 Long term (current) use of anticoagulants: Secondary | ICD-10-CM

## 2020-05-11 DIAGNOSIS — I4892 Unspecified atrial flutter: Secondary | ICD-10-CM

## 2020-05-11 DIAGNOSIS — Z5181 Encounter for therapeutic drug level monitoring: Secondary | ICD-10-CM | POA: Diagnosis not present

## 2020-05-11 LAB — POCT INR: INR: 4.4 — AB (ref 2.0–3.0)

## 2020-05-11 NOTE — Patient Instructions (Signed)
-   skip warfarin today and tomorrow - resume dosage of warfarin of 1 tablet every day EXCEPT 1/2 TABLET ON MONDAYS. - recheck as scheduled   BE CONSISTENT WITH YOUR GREENS INTAKE - pick a day or days to have your greens and do that every week.

## 2020-05-13 ENCOUNTER — Telehealth: Payer: Self-pay | Admitting: Cardiology

## 2020-05-13 NOTE — Telephone Encounter (Signed)
Spoke w/ pt.  Advised him that there are no interactions w/ warfarin w/ these medications, so we won't make any changes to his dosage. He verbalizes understanding and is appreciative of the call.

## 2020-05-13 NOTE — Telephone Encounter (Signed)
Patient says Dr. Pryor Ochoa added the following meds   Budesonide 6 mg and tobramycin 125 mg   Patient is concerned this will change pt inr and wants to discuss warfarin dose while on these meds.   Please call.    Please route to the Coumadin Clinic Pool

## 2020-05-18 DIAGNOSIS — A498 Other bacterial infections of unspecified site: Secondary | ICD-10-CM | POA: Diagnosis not present

## 2020-05-20 ENCOUNTER — Other Ambulatory Visit: Payer: Self-pay | Admitting: Family Medicine

## 2020-05-20 ENCOUNTER — Other Ambulatory Visit: Payer: Self-pay | Admitting: Urology

## 2020-05-20 ENCOUNTER — Other Ambulatory Visit: Payer: Self-pay | Admitting: Pulmonary Disease

## 2020-05-20 MED ORDER — PREDNISONE 5 MG PO TABS
5.0000 mg | ORAL_TABLET | Freq: Two times a day (BID) | ORAL | 3 refills | Status: DC
Start: 2020-05-20 — End: 2020-06-23

## 2020-05-20 NOTE — Telephone Encounter (Signed)
Name of Medication: Alprazolam Name of Pharmacy: [facility] or Written Date and Quantity: 04/04/20, #30 Last Office Visit and Type: 11/16/19, AWV Next Office Visit and Type: none Last Controlled Substance Agreement Date: none Last UDS: none

## 2020-05-23 NOTE — Telephone Encounter (Signed)
ERx 

## 2020-05-25 ENCOUNTER — Other Ambulatory Visit: Payer: Self-pay

## 2020-05-25 ENCOUNTER — Ambulatory Visit: Payer: Medicare HMO

## 2020-05-25 DIAGNOSIS — Z7901 Long term (current) use of anticoagulants: Secondary | ICD-10-CM | POA: Diagnosis not present

## 2020-05-25 DIAGNOSIS — I4892 Unspecified atrial flutter: Secondary | ICD-10-CM | POA: Diagnosis not present

## 2020-05-25 DIAGNOSIS — Z5181 Encounter for therapeutic drug level monitoring: Secondary | ICD-10-CM

## 2020-05-25 LAB — POCT INR: INR: 3.2 — AB (ref 2.0–3.0)

## 2020-05-25 NOTE — Patient Instructions (Signed)
-   skip warfarin today - resume dosage of warfarin of 1 tablet every day EXCEPT 1/2 TABLET ON MONDAYS. - recheck INR in 2 weeks  BE CONSISTENT WITH YOUR GREENS INTAKE - pick a day or days to have your greens and do that every week.

## 2020-05-26 ENCOUNTER — Emergency Department
Admission: EM | Admit: 2020-05-26 | Discharge: 2020-05-26 | Disposition: A | Discharge status: Home / Self Care | Payer: Medicare HMO | Attending: Emergency Medicine | Admitting: Emergency Medicine

## 2020-05-26 ENCOUNTER — Other Ambulatory Visit: Payer: Self-pay

## 2020-05-26 ENCOUNTER — Emergency Department: Payer: Medicare HMO

## 2020-05-26 DIAGNOSIS — S0003XA Contusion of scalp, initial encounter: Secondary | ICD-10-CM

## 2020-05-26 DIAGNOSIS — Z7951 Long term (current) use of inhaled steroids: Secondary | ICD-10-CM | POA: Insufficient documentation

## 2020-05-26 DIAGNOSIS — Z7901 Long term (current) use of anticoagulants: Secondary | ICD-10-CM | POA: Insufficient documentation

## 2020-05-26 DIAGNOSIS — W010XXA Fall on same level from slipping, tripping and stumbling without subsequent striking against object, initial encounter: Secondary | ICD-10-CM | POA: Insufficient documentation

## 2020-05-26 DIAGNOSIS — Z950 Presence of cardiac pacemaker: Secondary | ICD-10-CM | POA: Diagnosis not present

## 2020-05-26 DIAGNOSIS — Z96611 Presence of right artificial shoulder joint: Secondary | ICD-10-CM | POA: Insufficient documentation

## 2020-05-26 DIAGNOSIS — S0990XA Unspecified injury of head, initial encounter: Secondary | ICD-10-CM | POA: Diagnosis not present

## 2020-05-26 DIAGNOSIS — J45909 Unspecified asthma, uncomplicated: Secondary | ICD-10-CM | POA: Insufficient documentation

## 2020-05-26 DIAGNOSIS — I110 Hypertensive heart disease with heart failure: Secondary | ICD-10-CM | POA: Diagnosis not present

## 2020-05-26 DIAGNOSIS — I5022 Chronic systolic (congestive) heart failure: Secondary | ICD-10-CM | POA: Insufficient documentation

## 2020-05-26 DIAGNOSIS — S42001A Fracture of unspecified part of right clavicle, initial encounter for closed fracture: Secondary | ICD-10-CM | POA: Diagnosis not present

## 2020-05-26 DIAGNOSIS — M25521 Pain in right elbow: Secondary | ICD-10-CM | POA: Diagnosis not present

## 2020-05-26 NOTE — Discharge Instructions (Addendum)
Your CT scan of the head did not show any signs of bleeding in the brain.  Please continue taking your medications as prescribed.  If you have worsened headaches, vision changes, or other symptoms, please return to the ER for repeat assessment.

## 2020-05-26 NOTE — ED Triage Notes (Signed)
Pt tripped and fell earlier today and was seen at [facility], they checked over his there and seen for his right shoulder injury but sent him here for a head CT due to hitting his head and being on coumadin. Pt is a/ox4.[name]

## 2020-05-26 NOTE — ED Provider Notes (Signed)
[facility] Emergency Department Provider Note  ____________________________________________  Time seen: Approximately 1:57 PM  I have reviewed the triage vital signs and the nursing notes.   HISTORY  Chief Complaint Fall and Head Injury    HPI [name] is a 87 y.o. male with a history of asthma, GERD, AICD who comes ED after mechanical fall. He was initially seen by [facility], treated for a clavicle fracture and soft tissue injuries, but sent to the ED for evaluation due to hitting his head. Patient does take Coumadin. He denies any significant headache vision changes neck pain paresthesias or motor weakness. States that he feels fine. No vomiting. No loss of consciousness.      Past Medical History:  Diagnosis Date  . AICD (automatic cardioverter/defibrillator) present    s/p gen change 04/2015 w/ MDT [name] Crt-D  DTBA1D1, Serial number [id number]  . Allergic rhinitis    [name]  . Anemia   . Anxiety   . Arthritis   . Atrial flutter ([place])    A.  Status post cardioversion; B.  Tikosyn therapy - failed, remains in aflutter  . Bilateral pneumonia 11/02/2013   treated with levaquin  . BPH (benign prostatic hyperplasia)   . 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 Lucky Cowboy)  . Chronic lung disease    spirometry 2015 - no obstruction, + mild restrictive lung disease  . Chronic sinusitis   . Chronic systolic congestive heart failure ([place])   . Dyspnea    with exertion  . Extrinsic asthma    [name]  . Fall with injury 05/28/2017  . GERD (gastroesophageal reflux disease) 2010   h/o esophageal stricture with dilation, LA grade C reflux esophagitis by EGD 2010  . Goiter   . History of diverticulitis of colon   . History of GI bleed    Secondary to hemorrhoids  . History of seasonal allergies   . Hypertension   . IBS (irritable bowel syndrome)   . LBBB (left bundle branch block)   . Multiple pulmonary nodules 06/2013    RUL ([facility]. Adam Phenix [facility]) - ?vasculitis as of last CT at [facility]  . NICM (nonischemic cardiomyopathy) [facility])    Cardiac catheterization March 2006 without coronary disease; EF 25% 2018 Jan  . Orthopnea   . Porphyria ([place])   . Presence of permanent cardiac pacemaker   . Sinus congestion 09/25/2010     Patient Active Problem List   Diagnosis Date Noted  . Chronic atrial fibrillation ([place]) 02/16/2020  . Prediabetes 11/22/2019  . Pedal edema 11/16/2019  . Decreased visual acuity 11/16/2019  . Low back pain radiating to right lower extremity 07/09/2019  . Health maintenance examination 11/11/2018  . Chronic lung disease   . Nausea 11/04/2017  . Abnormal x-ray 10/28/2017  . Pulmonary HTN ([place]) 03/25/2017  . Aortic valve regurgitation 03/25/2017  . Other fatigue 12/04/2016  . Nondisplaced fracture of greater trochanter of left femur ([place]) 10/30/2016  . Status post reverse total arthroplasty of right shoulder 01/17/2016  . Complete tear of right rotator cuff 12/10/2015  . Acute sinusitis 03/18/2015  . Complete tear of left rotator cuff 05/07/2014  . Warfarin anticoagulation 05/07/2014  . Advanced care planning/counseling discussion 03/16/2014  . Constipation 09/09/2013  . Hyperglycemia 09/09/2013  . Medicare annual wellness visit, subsequent 04/22/2012  . 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[place]) 10/08/2011  . Left sided sciatica 06/11/2011  . Thrombocytopenia ([place]) 02/25/2011  . Vitamin  B12 deficiency 02/22/2011  . Congestion of respiratory tract 09/25/2010  . Long term current use of anticoagulant 06/27/2010  . ESOPHAGEAL STRICTURE 02/16/2010  . CHEST PAIN 01/03/2010  . Atrial flutter ([place]) 12/20/2008  . Anxiety 12/08/2008  . Secondary cardiomyopathy ([place]) 09/13/2008  . LBBB 09/13/2008  . Automatic implantable cardioverter-defibrillator in situ 09/13/2008  . PORPHYRIA 12/16/2006  . Chronic systolic heart failure ([place]) 12/16/2006  . Allergic rhinitis 12/16/2006  . GERD  12/16/2006  . BENIGN PROSTATIC HYPERTROPHY 12/16/2006  . IBS 12/08/1997     Past Surgical History:  Procedure Laterality Date  . BACK SURGERY  1997   bulging disks  . BiV-AICD implant     [employer]  . CARDIAC CATHETERIZATION  06/22/04   Severe, nonischemic cardiomyopathy,EF 25-30%  . CARDIOVERSION  02/22/09   AFlutter-([facility])  . CATARACT EXTRACTION W/PHACO Right 12/21/2019   Procedure: CATARACT EXTRACTION PHACO AND INTRAOCULAR LENS PLACEMENT (IOC) RIGHT 2.23  00:21.9;  Surgeon: Eulogio Bear, MD;  Location: [place];  Service: Ophthalmology;  Laterality: Right;  . CATARACT EXTRACTION W/PHACO Left 02/01/2020   Procedure: CATARACT EXTRACTION PHACO AND INTRAOCULAR LENS PLACEMENT (IOC) LEFT 3.67 00:39.5;  Surgeon: Eulogio Bear, MD;  Location: [place];  Service: Ophthalmology;  Laterality: Left;  . CHOLECYSTECTOMY    . COLONOSCOPY  10/99   Divertic, splenic, hepatic fleure only  . COLONOSCOPY  10/21/08   aborted-divertics, int hemms (Dr. Vira Agar)  . CORONARY ANGIOPLASTY    . EP IMPLANTABLE DEVICE N/A 04/25/2015   Procedure: BIV ICD Generator Changeout;  Surgeon: Evans Lance, MD;  Location: [place] [facility];  Service: Cardiovascular;  Laterality: N/A;  . ESOPHAGEAL DILATION  02/18/98  . ESOPHAGOGASTRODUODENOSCOPY  01/1998   stricture, sliding HH, GERD  . ESOPHAGOGASTRODUODENOSCOPY  04/08/01   stricture, gastritis, HH, GERD-no dilation(Dr. Henrene Pastor)  . ESOPHAGOGASTRODUODENOSCOPY  12/22/04   stricture; gastritis; duodenitis, GERD  . ESOPHAGOGASTRODUODENOSCOPY  10/21/08   Reflux esophagitis; Erythem. Duod. (Dr. Vira Agar)  . ESOPHAGOGASTRODUODENOSCOPY  08/2013   erythema gastric fundus - minimal gastritis, HH ([place])  . ESOPHAGOGASTRODUODENOSCOPY  08/2016   dysphagia - mod schatzki rin, dilated Tiffany Kocher)  . ESOPHAGOGASTRODUODENOSCOPY (EGD) WITH PROPOFOL N/A 08/24/2016   mod schatzki ring dilated, duodenal deformity Vira Agar, Gavin Pound, MD)  . goiter removal    .  HERNIA REPAIR    . HERNIA REPAIR  01/30/02   Dr. Hassell Done  . INSERT / REPLACE / REMOVE PACEMAKER  2007 and 2017  . JOINT REPLACEMENT Right    shoulder  . NOSE SURGERY  1971   sinus surgery  . PENILE PROSTHESIS IMPLANT  2007   [place]  . PFT  10/2013   FVC 61%, FEV1 63%, ratio 0.76  . Pulmonary eval  04/2002   ([facility]) Chronic congestive symptoms  . REVERSE SHOULDER ARTHROPLASTY Right 01/17/2016   Procedure: REVERSE SHOULDER ARTHROPLASTY;  Surgeon: Corky Mull, MD;  Location: [facility];  Service: Orthopedics;  Laterality: Right;  . REVERSE TOTAL SHOULDER ARTHROPLASTY Right 01/2016   [place]  . US ECHOCARDIOGRAPHY  07/13/04   EF 25-30%, Mod LVH; LA severe dilation; Mild AR; IRTR  . US ECHOCARDIOGRAPHY  11/27/06   hypokinesis posterior wall, EF 35%; mild AR     Prior to Admission medications   Medication Sig Start Date End Date Taking? Authorizing Provider  acetaminophen (TYLENOL) 500 MG tablet Take 250 mg by mouth daily as needed. Pain     [provider]  ALPRAZolam (XANAX) 0.25 MG tablet TAKE 1 TABLET BY MOUTH  EVERYDAY AT BEDTIME 05/23/20   Ria Bush, MD  alum & mag hydroxide-simeth (MAALOX/MYLANTA) 200-200-20 MG/5ML suspension Take 15 mLs by mouth daily as needed for indigestion or heartburn.    [provider]  budesonide-formoterol (SYMBICORT) 160-4.5 MCG/ACT inhaler Inhale 2 puffs into the lungs at bedtime.    [provider]  carvedilol (COREG) 6.25 MG tablet TAKE 1 AND 1/2 TABLETS BY MOUTH TWICE DAILY 04/04/20   [name], MD  fluticasone (FLONASE) 50 MCG/ACT nasal spray Place 1 spray into both nostrils daily.    [provider]  furosemide (LASIX) 40 MG tablet Take 1.5 tablets (60 mg total) by mouth 2 (two) times daily. 02/23/20 05/23/20  [name], MD  guaiFENesin 200 MG tablet Take 400 mg by mouth every 4 (four) hours as needed for cough or to loosen phlegm.    [provider]  levalbuterol Penne Lash HFA) 45 MCG/ACT inhaler  Inhale 1-2 puffs into the lungs every 6 (six) hours as needed for wheezing or shortness of breath. 08/31/14   Ria Bush, MD  Loratadine 10 MG CAPS Take 1 capsule by mouth daily as needed (allergies).    [provider]  montelukast (SINGULAIR) 10 MG tablet Take 10 mg by mouth daily as needed (BREATHING).     [provider]  polyethylene glycol (MIRALAX / GLYCOLAX) packet Take 17 g by mouth daily as needed (constipation).     [provider]  predniSONE (DELTASONE) 5 MG tablet Take 1 tablet (5 mg total) by mouth 2 (two) times daily with a meal. 05/20/20   Olalere, Adewale A, MD  tamsulosin (FLOMAX) 0.4 MG CAPS capsule TAKE 1 CAPSULE BY MOUTH EVERY DAY 05/20/20   Billey Co, MD  vitamin B-12 (CYANOCOBALAMIN) 500 MCG tablet Take 2 tablets (1,000 mcg total) by mouth daily. 11/16/19   Ria Bush, MD  warfarin (COUMADIN) 5 MG tablet TAKE 1/2 TO 1 TABLET BY MOUTH DAILY AS DIRECTED BY COUMADIN CLINIC 12/30/19   Evans Lance, MD     Allergies Carafate [sucralfate], Clarithromycin, Famotidine, Levaquin [levofloxacin], Omeprazole, Oxytetracycline, Penicillins, Proton pump inhibitors, Ranitidine, and Doxycycline   Family History  Problem Relation Age of Onset  . Coronary artery disease Father   . Hypertension Father   . Heart failure Mother   . Dysphagia Sister   . Breast cancer Other   . Ovarian cancer Other   . Uterine cancer Other   . Other Sister        stomach problems  . Other Sister        stomach problems  . Other Sister        stomach problems  . Alcohol abuse Maternal Uncle     Social History Social History   Tobacco Use  . Smoking status: Never Smoker  . Smokeless tobacco: Never Used  Vaping Use  . Vaping Use: Never used  Substance Use Topics  . Alcohol use: No  . Drug use: No    Review of Systems  Constitutional:   No fever or chills.  ENT:   No sore throat. No rhinorrhea. Cardiovascular:   No chest pain or  syncope. Respiratory:   No dyspnea or cough.  Musculoskeletal:   Right shoulder pain after fall All other systems reviewed and are negative except as documented above in ROS and HPI.  ____________________________________________   PHYSICAL EXAM:  VITAL SIGNS: ED Triage Vitals  Enc Vitals Group     BP 05/26/20 1228 112/71     Pulse Rate 05/26/20  1228 69     Resp 05/26/20 1228 18     Temp 05/26/20 1228 97.6 F (36.4 C)     Temp Source 05/26/20 1228 Oral     SpO2 05/26/20 1228 100 %     Weight 05/26/20 1227 142 lb (64.4 kg)     Height 05/26/20 1227 5\' 4"  (1.626 m)     Head Circumference --      Peak Flow --      Pain Score 05/26/20 1227 4     Pain Loc --      Pain Edu? --      Excl. in [place]? --     Vital signs reviewed, nursing assessments reviewed.   Constitutional:   Alert and oriented. Non-toxic appearance. Eyes:   Conjunctivae are normal. EOMI. PERRL. ENT      Head:   Normocephalic with right temporal scalp hematoma, no laceration. No otorrhea      Nose: Normal, no epistaxis or rhinorrhea      Mouth/Throat:   No intraoral injury      Neck:   No meningismus. Full ROM. No midline C-spine tenderness  Cardiovascular:   RRR. Symmetric bilateral radial and DP pulses.  No murmurs. Cap refill less than 2 seconds. Respiratory:   Normal respiratory effort without tachypnea/retractions. Breath sounds are clear and equal bilaterally.   Musculoskeletal: Right arm in sling from recent orthopedic treatment. Other extremities unremarkable with normal range of motion. Neurologic:   Normal speech and language.  Motor grossly intact. No acute focal neurologic deficits are appreciated.  Skin:    Skin is warm, dry and intact. Scalp hematoma as above. No rash noted.  No petechiae, purpura, or bullae.  ____________________________________________    LABS (pertinent positives/negatives) (all labs ordered are listed, but only abnormal results are displayed) Labs Reviewed - No data to  display ____________________________________________   EKG    ____________________________________________    RADIOLOGY  CT Head Wo Contrast  Result Date: 05/26/2020 CLINICAL DATA:  Head injury after fall. EXAM: CT HEAD WITHOUT CONTRAST TECHNIQUE: Contiguous axial images were obtained from the base of the skull through the vertex without intravenous contrast. COMPARISON:  None. FINDINGS: Brain: Mild diffuse cortical atrophy is noted. Mild chronic ischemic white matter disease is noted. No mass effect or midline shift is noted. Ventricular size is within normal limits. There is no evidence of mass lesion, hemorrhage or acute infarction. Vascular: No hyperdense vessel or unexpected calcification. Skull: Normal. Negative for fracture or focal lesion. Sinuses/Orbits: No acute finding. Other: None. IMPRESSION: Mild diffuse cortical atrophy. Mild chronic ischemic white matter disease. No acute intracranial abnormality seen. Electronically Signed   By: Marijo Conception M.D.   On: 05/26/2020 13:26    ____________________________________________   PROCEDURES Procedures  ____________________________________________  DIFFERENTIAL DIAGNOSIS   Intracranial hemorrhage, scalp contusion  CLINICAL IMPRESSION / ASSESSMENT AND PLAN / ED COURSE  Medications ordered in the ED: Medications - No data to display  Pertinent labs & imaging results that were available during my care of the patient were reviewed by me and considered in my medical decision making (see chart for details).  [name] was evaluated in Emergency Department on 05/26/2020 for the symptoms described in the history of present illness. He was evaluated in the context of the global COVID-19 pandemic, which necessitated consideration that the patient might be at risk for infection with the SARS-CoV-2 virus that causes COVID-19. Institutional protocols and algorithms that pertain to the evaluation of patients at risk  for  COVID-19 are in a state of rapid change based on information released by regulatory bodies including the CDC and federal and state organizations. These policies and algorithms were followed during the patient's care in the ED.   Patient presents after mechanical fall resulting in blunt head trauma while on Coumadin. CT head negative. Symptoms minimal. Return precautions for any worsening symptoms otherwise stable for discharge home at this time.      ____________________________________________   FINAL CLINICAL IMPRESSION(S) / ED DIAGNOSES    Final diagnoses:  Contusion of scalp, initial encounter     ED Discharge Orders    None      Portions of this note were generated with dragon dictation software. Dictation errors may occur despite best attempts at proofreading.   Carrie Mew, MD 05/26/20 1400

## 2020-06-02 DIAGNOSIS — J42 Unspecified chronic bronchitis: Secondary | ICD-10-CM | POA: Diagnosis not present

## 2020-06-02 DIAGNOSIS — D696 Thrombocytopenia, unspecified: Secondary | ICD-10-CM | POA: Diagnosis not present

## 2020-06-02 DIAGNOSIS — R768 Other specified abnormal immunological findings in serum: Secondary | ICD-10-CM | POA: Diagnosis not present

## 2020-06-02 DIAGNOSIS — M810 Age-related osteoporosis without current pathological fracture: Secondary | ICD-10-CM | POA: Diagnosis not present

## 2020-06-03 ENCOUNTER — Telehealth: Payer: Self-pay

## 2020-06-03 NOTE — Telephone Encounter (Signed)
Patient started 250 mg levofloxacin, an antibiotic for his lungs. Patient needs to be advised on his coumadin dosage until he comes in the office for a check

## 2020-06-03 NOTE — Telephone Encounter (Signed)
Called and spoke to pt. Informed him that levofloxacin can increase his INR. Pt is scheduled to have INR checked on 3/2  At the [place] office. Asked pt if he could reschedule his appointment and have his INR rechecked on Monday at the [address] office in [place]. Pt stated he did not want to drive to [place]. Instructed pt to eat 2 extra serving of greens over the weekend.

## 2020-06-06 DIAGNOSIS — S42001A Fracture of unspecified part of right clavicle, initial encounter for closed fracture: Secondary | ICD-10-CM | POA: Diagnosis not present

## 2020-06-07 DIAGNOSIS — Z8719 Personal history of other diseases of the digestive system: Secondary | ICD-10-CM

## 2020-06-07 HISTORY — DX: Personal history of other diseases of the digestive system: Z87.19

## 2020-06-09 DIAGNOSIS — R0600 Dyspnea, unspecified: Secondary | ICD-10-CM | POA: Diagnosis not present

## 2020-06-09 DIAGNOSIS — R0689 Other abnormalities of breathing: Secondary | ICD-10-CM | POA: Diagnosis not present

## 2020-06-14 ENCOUNTER — Other Ambulatory Visit: Payer: Self-pay

## 2020-06-14 ENCOUNTER — Emergency Department: Payer: Medicare HMO

## 2020-06-14 ENCOUNTER — Emergency Department
Admission: EM | Admit: 2020-06-14 | Discharge: 2020-06-14 | Disposition: A | Discharge status: Home / Self Care | Payer: Medicare HMO | Attending: Emergency Medicine | Admitting: Emergency Medicine

## 2020-06-14 ENCOUNTER — Encounter: Payer: Self-pay | Admitting: Emergency Medicine

## 2020-06-14 DIAGNOSIS — I708 Atherosclerosis of other arteries: Secondary | ICD-10-CM | POA: Diagnosis not present

## 2020-06-14 DIAGNOSIS — Z7901 Long term (current) use of anticoagulants: Secondary | ICD-10-CM | POA: Diagnosis not present

## 2020-06-14 DIAGNOSIS — Z79899 Other long term (current) drug therapy: Secondary | ICD-10-CM | POA: Insufficient documentation

## 2020-06-14 DIAGNOSIS — K5900 Constipation, unspecified: Secondary | ICD-10-CM | POA: Diagnosis not present

## 2020-06-14 DIAGNOSIS — K5720 Diverticulitis of large intestine with perforation and abscess without bleeding: Secondary | ICD-10-CM

## 2020-06-14 DIAGNOSIS — Z95810 Presence of automatic (implantable) cardiac defibrillator: Secondary | ICD-10-CM | POA: Insufficient documentation

## 2020-06-14 DIAGNOSIS — I4891 Unspecified atrial fibrillation: Secondary | ICD-10-CM | POA: Diagnosis not present

## 2020-06-14 DIAGNOSIS — R1030 Lower abdominal pain, unspecified: Secondary | ICD-10-CM | POA: Diagnosis not present

## 2020-06-14 DIAGNOSIS — K5732 Diverticulitis of large intestine without perforation or abscess without bleeding: Secondary | ICD-10-CM | POA: Insufficient documentation

## 2020-06-14 DIAGNOSIS — I110 Hypertensive heart disease with heart failure: Secondary | ICD-10-CM | POA: Diagnosis not present

## 2020-06-14 DIAGNOSIS — I5022 Chronic systolic (congestive) heart failure: Secondary | ICD-10-CM | POA: Diagnosis not present

## 2020-06-14 DIAGNOSIS — I4892 Unspecified atrial flutter: Secondary | ICD-10-CM | POA: Insufficient documentation

## 2020-06-14 DIAGNOSIS — K4090 Unilateral inguinal hernia, without obstruction or gangrene, not specified as recurrent: Secondary | ICD-10-CM | POA: Diagnosis not present

## 2020-06-14 DIAGNOSIS — K7689 Other specified diseases of liver: Secondary | ICD-10-CM | POA: Diagnosis not present

## 2020-06-14 LAB — CBC
HCT: 39 % (ref 39.0–52.0)
Hemoglobin: 12.9 g/dL — ABNORMAL LOW (ref 13.0–17.0)
MCH: 29.9 pg (ref 26.0–34.0)
MCHC: 33.1 g/dL (ref 30.0–36.0)
MCV: 90.3 fL (ref 80.0–100.0)
Platelets: 166 10*3/uL (ref 150–400)
RBC: 4.32 MIL/uL (ref 4.22–5.81)
RDW: 16.2 % — ABNORMAL HIGH (ref 11.5–15.5)
WBC: 10.5 10*3/uL (ref 4.0–10.5)
nRBC: 0.2 % (ref 0.0–0.2)

## 2020-06-14 LAB — COMPREHENSIVE METABOLIC PANEL
ALT: 21 U/L (ref 0–44)
AST: 28 U/L (ref 15–41)
Albumin: 3.6 g/dL (ref 3.5–5.0)
Alkaline Phosphatase: 94 U/L (ref 38–126)
Anion gap: 10 (ref 5–15)
BUN: 40 mg/dL — ABNORMAL HIGH (ref 8–23)
CO2: 28 mmol/L (ref 22–32)
Calcium: 9 mg/dL (ref 8.9–10.3)
Chloride: 95 mmol/L — ABNORMAL LOW (ref 98–111)
Creatinine, Ser: 1.76 mg/dL — ABNORMAL HIGH (ref 0.61–1.24)
GFR, Estimated: 37 mL/min — ABNORMAL LOW (ref >60)
Glucose, Bld: 116 mg/dL — ABNORMAL HIGH (ref 70–99)
Potassium: 4.9 mmol/L (ref 3.5–5.1)
Sodium: 133 mmol/L — ABNORMAL LOW (ref 135–145)
Total Bilirubin: 1.8 mg/dL — ABNORMAL HIGH (ref 0.3–1.2)
Total Protein: 6.3 g/dL — ABNORMAL LOW (ref 6.5–8.1)

## 2020-06-14 LAB — PROTIME-INR
INR: 3.9 — ABNORMAL HIGH (ref 0.8–1.2)
Prothrombin Time: 37 seconds — ABNORMAL HIGH (ref 11.4–15.2)

## 2020-06-14 LAB — APTT: aPTT: 45 seconds — ABNORMAL HIGH (ref 24–36)

## 2020-06-14 LAB — LIPASE, BLOOD: Lipase: 33 U/L (ref 11–51)

## 2020-06-14 MED ORDER — SODIUM CHLORIDE 0.9 % IV BOLUS
500.0000 mL | Freq: Once | INTRAVENOUS | Status: AC
  Administered 2020-06-14: 500 mL via INTRAVENOUS

## 2020-06-14 MED ORDER — AMOXICILLIN-POT CLAVULANATE 875-125 MG PO TABS: 1.0000 | ORAL_TABLET | Freq: Three times a day (TID) | ORAL | 0 refills | Status: DC

## 2020-06-14 MED ORDER — IOHEXOL 300 MG/ML  SOLN
75.0000 mL | Freq: Once | INTRAMUSCULAR | Status: AC | PRN
  Administered 2020-06-14: 75 mL via INTRAVENOUS
  Filled 2020-06-14: qty 75

## 2020-06-14 NOTE — ED Notes (Signed)
Patient is back in bed resting.

## 2020-06-14 NOTE — Discharge Instructions (Signed)
You are seen in the ED because of your abdominal cramping and stool changes.  You have evidence of diverticulitis.  You are being discharged with one antibiotic called Augmentin to take 3 times daily for the next 7 days. Please ensure that you pick up the prescription for probiotic as well when you go to the pharmacy.  Take a probiotic with your Augmentin.   Follow-up with your PCP.  Return to the ED with any worsening symptoms despite these medications.

## 2020-06-14 NOTE — ED Triage Notes (Addendum)
C/O abdominal cramping.  No BM x 3 days..  Drank a bottle of Mag Citrate yesterday without results.  Patient reports recently taking antibiotics for a lung infection, which gave him diarrhea (last week).  Patient stopped antibiotics due to diarrhea.  No normal BM since diarrhea.  AAOx3.  Skin warm and dry.  NAD

## 2020-06-14 NOTE — ED Provider Notes (Signed)
[facility] Emergency Department Provider Note   ____________________________________________    I have reviewed the triage vital signs and the nursing notes.   HISTORY  Chief Complaint Abdominal Pain     HPI [name] is a 87 y.o. male who presents with complaints of constipation and abdominal cramping.  Patient reports has not been able to have a bowel movement over the last 3 days.  He reports he has intermittent lower abdominal cramping.  He reports he put his finger in his bottom yesterday was able to feel hard stool.  He took some magnesium citrate yesterday without relief.  No fevers or chills.  He reports last week he had diarrhea caused by antibiotics.  That resolved without intervention  Past Medical History:  Diagnosis Date  . AICD (automatic cardioverter/defibrillator) present    s/p gen change 04/2015 w/ MDT [name] Crt-D  DTBA1D1, Serial number [id number]  . Allergic rhinitis    [name]  . Anemia   . Anxiety   . Arthritis   . Atrial flutter ([place])    A.  Status post cardioversion; B.  Tikosyn therapy - failed, remains in aflutter  . Bilateral pneumonia 11/02/2013   treated with levaquin  . BPH (benign prostatic hyperplasia)   . 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 Lucky Cowboy)  . Chronic lung disease    spirometry 2015 - no obstruction, + mild restrictive lung disease  . Chronic sinusitis   . Chronic systolic congestive heart failure ([place])   . Dyspnea    with exertion  . Extrinsic asthma    [name]  . Fall with injury 05/28/2017  . GERD (gastroesophageal reflux disease) 2010   h/o esophageal stricture with dilation, LA grade C reflux esophagitis by EGD 2010  . Goiter   . History of diverticulitis of colon   . History of GI bleed    Secondary to hemorrhoids  . History of seasonal allergies   . Hypertension   . IBS (irritable bowel syndrome)   . LBBB (left bundle branch block)   . Multiple  pulmonary nodules 06/2013   RUL (Dr. Adam Phenix at [facility]) - ?vasculitis as of last CT at [facility]  . NICM (nonischemic cardiomyopathy) [facility])    Cardiac catheterization March 2006 without coronary disease; EF 25% 2018 Jan  . Orthopnea   . Porphyria ([place])   . Presence of permanent cardiac pacemaker   . Sinus congestion 09/25/2010    Patient Active Problem List   Diagnosis Date Noted  . Chronic atrial fibrillation ([place]) 02/16/2020  . Prediabetes 11/22/2019  . Pedal edema 11/16/2019  . Decreased visual acuity 11/16/2019  . Low back pain radiating to right lower extremity 07/09/2019  . Health maintenance examination 11/11/2018  . Chronic lung disease   . Nausea 11/04/2017  . Abnormal x-ray 10/28/2017  . Pulmonary HTN ([place]) 03/25/2017  . Aortic valve regurgitation 03/25/2017  . Other fatigue 12/04/2016  . Nondisplaced fracture of greater trochanter of left femur ([place]) 10/30/2016  . Status post reverse total arthroplasty of right shoulder 01/17/2016  . Complete tear of right rotator cuff 12/10/2015  . Acute sinusitis 03/18/2015  . Complete tear of left rotator cuff 05/07/2014  . Warfarin anticoagulation 05/07/2014  . Advanced care planning/counseling discussion 03/16/2014  . Constipation 09/09/2013  . Hyperglycemia 09/09/2013  . Medicare annual wellness visit, subsequent 04/22/2012  . 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 Left sided sciatica 06/11/2011  .  Thrombocytopenia ([place]) 02/25/2011  . Vitamin B12 deficiency 02/22/2011  . Congestion of respiratory tract 09/25/2010  . Long term current use of anticoagulant 06/27/2010  . ESOPHAGEAL STRICTURE 02/16/2010  . CHEST PAIN 01/03/2010  . Atrial flutter ([place]) 12/20/2008  . Anxiety 12/08/2008  . Secondary cardiomyopathy ([place]) 09/13/2008  . LBBB 09/13/2008  . Automatic implantable cardioverter-defibrillator in situ 09/13/2008  . PORPHYRIA 12/16/2006  . Chronic systolic heart failure ([place]) 12/16/2006  . Allergic rhinitis  12/16/2006  . GERD 12/16/2006  . BENIGN PROSTATIC HYPERTROPHY 12/16/2006  . IBS 12/08/1997    Past Surgical History:  Procedure Laterality Date  . BACK SURGERY  1997   bulging disks  . BiV-AICD implant     [employer]  . CARDIAC CATHETERIZATION  06/22/04   Severe, nonischemic cardiomyopathy,EF 25-30%  . CARDIOVERSION  02/22/09   AFlutter-([facility])  . CATARACT EXTRACTION W/PHACO Right 12/21/2019   Procedure: CATARACT EXTRACTION PHACO AND INTRAOCULAR LENS PLACEMENT (IOC) RIGHT 2.23  00:21.9;  Surgeon: Eulogio Bear, MD;  Location: [place];  Service: Ophthalmology;  Laterality: Right;  . CATARACT EXTRACTION W/PHACO Left 02/01/2020   Procedure: CATARACT EXTRACTION PHACO AND INTRAOCULAR LENS PLACEMENT (IOC) LEFT 3.67 00:39.5;  Surgeon: Eulogio Bear, MD;  Location: [place];  Service: Ophthalmology;  Laterality: Left;  . CHOLECYSTECTOMY    . COLONOSCOPY  10/99   Divertic, splenic, hepatic fleure only  . COLONOSCOPY  10/21/08   aborted-divertics, int hemms (Dr. Vira Agar)  . CORONARY ANGIOPLASTY    . EP IMPLANTABLE DEVICE N/A 04/25/2015   Procedure: BIV ICD Generator Changeout;  Surgeon: Evans Lance, MD;  Location: [place] [facility];  Service: Cardiovascular;  Laterality: N/A;  . ESOPHAGEAL DILATION  02/18/98  . ESOPHAGOGASTRODUODENOSCOPY  01/1998   stricture, sliding HH, GERD  . ESOPHAGOGASTRODUODENOSCOPY  04/08/01   stricture, gastritis, HH, GERD-no dilation(Dr. Henrene Pastor)  . ESOPHAGOGASTRODUODENOSCOPY  12/22/04   stricture; gastritis; duodenitis, GERD  . ESOPHAGOGASTRODUODENOSCOPY  10/21/08   Reflux esophagitis; Erythem. Duod. (Dr. Vira Agar)  . ESOPHAGOGASTRODUODENOSCOPY  08/2013   erythema gastric fundus - minimal gastritis, HH ([place])  . ESOPHAGOGASTRODUODENOSCOPY  08/2016   dysphagia - mod schatzki rin, dilated Tiffany Kocher)  . ESOPHAGOGASTRODUODENOSCOPY (EGD) WITH PROPOFOL N/A 08/24/2016   mod schatzki ring dilated, duodenal deformity Vira Agar, Gavin Pound, MD)  .  goiter removal    . HERNIA REPAIR    . HERNIA REPAIR  01/30/02   Dr. Hassell Done  . INSERT / REPLACE / REMOVE PACEMAKER  2007 and 2017  . JOINT REPLACEMENT Right    shoulder  . NOSE SURGERY  1971   sinus surgery  . PENILE PROSTHESIS IMPLANT  2007   [place]  . PFT  10/2013   FVC 61%, FEV1 63%, ratio 0.76  . Pulmonary eval  04/2002   ([facility]) Chronic congestive symptoms  . REVERSE SHOULDER ARTHROPLASTY Right 01/17/2016   Procedure: REVERSE SHOULDER ARTHROPLASTY;  Surgeon: Corky Mull, MD;  Location: [facility];  Service: Orthopedics;  Laterality: Right;  . REVERSE TOTAL SHOULDER ARTHROPLASTY Right 01/2016   Poggi  . US ECHOCARDIOGRAPHY  07/13/04   EF 25-30%, Mod LVH; LA severe dilation; Mild AR; IRTR  . US ECHOCARDIOGRAPHY  11/27/06   hypokinesis posterior wall, EF 35%; mild AR    Prior to Admission medications   Medication Sig Start Date End Date Taking? Authorizing Provider  acetaminophen (TYLENOL) 500 MG tablet Take 250 mg by mouth daily as needed. Pain     [provider]  ALPRAZolam Duanne Moron) 0.25 MG tablet TAKE  1 TABLET BY MOUTH EVERYDAY AT BEDTIME 05/23/20   Ria Bush, MD  alum & mag hydroxide-simeth (MAALOX/MYLANTA) 200-200-20 MG/5ML suspension Take 15 mLs by mouth daily as needed for indigestion or heartburn.    [provider]  budesonide-formoterol (SYMBICORT) 160-4.5 MCG/ACT inhaler Inhale 2 puffs into the lungs at bedtime.    [provider]  carvedilol (COREG) 6.25 MG tablet TAKE 1 AND 1/2 TABLETS BY MOUTH TWICE DAILY 04/04/20   [name], MD  fluticasone (FLONASE) 50 MCG/ACT nasal spray Place 1 spray into both nostrils daily.    [provider]  furosemide (LASIX) 40 MG tablet Take 1.5 tablets (60 mg total) by mouth 2 (two) times daily. 02/23/20 05/23/20  [name], MD  guaiFENesin 200 MG tablet Take 400 mg by mouth every 4 (four) hours as needed for cough or to loosen phlegm.    [provider]  levalbuterol Penne Lash HFA)  45 MCG/ACT inhaler Inhale 1-2 puffs into the lungs every 6 (six) hours as needed for wheezing or shortness of breath. 08/31/14   Ria Bush, MD  Loratadine 10 MG CAPS Take 1 capsule by mouth daily as needed (allergies).    [provider]  montelukast (SINGULAIR) 10 MG tablet Take 10 mg by mouth daily as needed (BREATHING).     [provider]  polyethylene glycol (MIRALAX / GLYCOLAX) packet Take 17 g by mouth daily as needed (constipation).     [provider]  predniSONE (DELTASONE) 5 MG tablet Take 1 tablet (5 mg total) by mouth 2 (two) times daily with a meal. 05/20/20   Olalere, Adewale A, MD  tamsulosin (FLOMAX) 0.4 MG CAPS capsule TAKE 1 CAPSULE BY MOUTH EVERY DAY 05/20/20   Billey Co, MD  vitamin B-12 (CYANOCOBALAMIN) 500 MCG tablet Take 2 tablets (1,000 mcg total) by mouth daily. 11/16/19   Ria Bush, MD  warfarin (COUMADIN) 5 MG tablet TAKE 1/2 TO 1 TABLET BY MOUTH DAILY AS DIRECTED BY COUMADIN CLINIC 12/30/19   Evans Lance, MD     Allergies Carafate [sucralfate], Clarithromycin, Famotidine, Levaquin [levofloxacin], Omeprazole, Oxytetracycline, Penicillins, Proton pump inhibitors, Ranitidine, and Doxycycline  Family History  Problem Relation Age of Onset  . Coronary artery disease Father   . Hypertension Father   . Heart failure Mother   . Dysphagia Sister   . Breast cancer Other   . Ovarian cancer Other   . Uterine cancer Other   . Other Sister        stomach problems  . Other Sister        stomach problems  . Other Sister        stomach problems  . Alcohol abuse Maternal Uncle     Social History Social History   Tobacco Use  . Smoking status: Never Smoker  . Smokeless tobacco: Never Used  Vaping Use  . Vaping Use: Never used  Substance Use Topics  . Alcohol use: No  . Drug use: No    Review of Systems  Constitutional: No fever/chills Eyes: No visual changes.  ENT: No sore throat. Cardiovascular: Denies chest  pain. Respiratory: Denies shortness of breath. Gastrointestinal: As above Genitourinary: Negative for dysuria. Musculoskeletal: Negative for back pain. Skin: Negative for rash. Neurological: Negative for headaches    ____________________________________________   PHYSICAL EXAM:  VITAL SIGNS: ED Triage Vitals  Enc Vitals Group     BP 06/14/20 0941 97/62     Pulse Rate 06/14/20 0941 60     Resp 06/14/20 0941  16     Temp 06/14/20 0941 98.4 F (36.9 C)     Temp Source 06/14/20 0941 Oral     SpO2 06/14/20 0941 98 %     Weight 06/14/20 0944 64.4 kg (142 lb)     Height 06/14/20 0944 1.626 m (5\' 4" )     Head Circumference --      Peak Flow --      Pain Score 06/14/20 0943 5     Pain Loc --      Pain Edu? --      Excl. in [place]? --     Constitutional: Alert and oriented.   Nose: No congestion/rhinnorhea. Mouth/Throat: Mucous membranes are moist.   Neck:  Painless ROM Cardiovascular: Normal rate, regular rhythm. Kermit Balo peripheral circulation. Respiratory: Normal respiratory effort.  No retractions.  Gastrointestinal: Soft, nontender, minimal distention, no CVA tenderness.  Rectal exam, no hard stool in the rectal vault, no impaction  Musculoskeletal:   Warm and well perfused Neurologic:  Normal speech and language. No gross focal neurologic deficits are appreciated.  Skin:  Skin is warm, dry and intact. No rash noted. Psychiatric: Mood and affect are normal. Speech and behavior are normal.  ____________________________________________   LABS (all labs ordered are listed, but only abnormal results are displayed)  Labs Reviewed  COMPREHENSIVE METABOLIC PANEL - Abnormal; Notable for the following components:      Result Value   Sodium 133 (*)    Chloride 95 (*)    Glucose, Bld 116 (*)    BUN 40 (*)    Creatinine, Ser 1.76 (*)    Total Protein 6.3 (*)    Total Bilirubin 1.8 (*)    GFR, Estimated 37 (*)    All other components within normal limits  CBC - Abnormal;  Notable for the following components:   Hemoglobin 12.9 (*)    RDW 16.2 (*)    All other components within normal limits  PROTIME-INR - Abnormal; Notable for the following components:   Prothrombin Time 37.0 (*)    INR 3.9 (*)    All other components within normal limits  APTT - Abnormal; Notable for the following components:   aPTT 45 (*)    All other components within normal limits  LIPASE, BLOOD  URINALYSIS, COMPLETE (UACMP) WITH MICROSCOPIC   ____________________________________________  EKG  None ____________________________________________  RADIOLOGY  CT abdomen pelvis ____________________________________________   PROCEDURES  Procedure(s) performed: no  Procedures   Critical Care performed: No ____________________________________________   INITIAL IMPRESSION / ASSESSMENT AND PLAN / ED COURSE  Pertinent labs & imaging results that were available during my care of the patient were reviewed by me and considered in my medical decision making (see chart for details).  Patient presents with intermittent abdominal cramping and reports of constipation, he reports he can feel stool in his rectum that will come out.  Lab work today is overall reassuring, mild dehydration noted with elevated BUN to creatinine ratio.  White blood cell count is normal.  Attempted fecal disimpaction however no impaction palpated, minimal stool in the vault  We will send for CT to evaluate further, IV fluid bolus ordered  Have asked my colleague to follow-up on CT result    ____________________________________________   FINAL CLINICAL IMPRESSION(S) / ED DIAGNOSES  Final diagnoses:  Lower abdominal pain  Constipation, unspecified constipation type        Note:  This document was prepared using Dragon voice recognition software and may include unintentional dictation errors.  Lavonia Drafts, MD 06/14/20 [phone]

## 2020-06-14 NOTE — ED Notes (Signed)
See triage note, pt reports unable to have bm x 3 days. Reports recent diarrhea from antibiotics. Was taking antibiotics for bacterial infection in lungs  Pt in NAD, alert and oriented.

## 2020-06-14 NOTE — ED Notes (Signed)
Walked patient to the restroom. Wife is assisting in the restroom.

## 2020-06-14 NOTE — ED Provider Notes (Signed)
  Patient received in signout from Dr. Corky Downs pending CT abdomen/pelvis for evaluation abdominal cramping stool changes.  CT reviewed with evidence of acute uncomplicated diverticulitis.  I reevaluated patient and he has mild lower quadrant tenderness without peritoneal features.  Reports he feels well and is requesting discharge.  We discussed course of antibiotics with probiotics to treat his diverticulitis.  We discussed return precautions for the ED and following up closely with his PCP.  Patient stable for outpatient management.   Vladimir Crofts, MD 06/14/20 [phone]

## 2020-06-15 ENCOUNTER — Encounter: Payer: Self-pay | Admitting: Family Medicine

## 2020-06-15 ENCOUNTER — Ambulatory Visit: Payer: Medicare HMO

## 2020-06-15 ENCOUNTER — Other Ambulatory Visit: Payer: Self-pay

## 2020-06-15 DIAGNOSIS — Z5181 Encounter for therapeutic drug level monitoring: Secondary | ICD-10-CM | POA: Diagnosis not present

## 2020-06-15 DIAGNOSIS — I4892 Unspecified atrial flutter: Secondary | ICD-10-CM

## 2020-06-15 DIAGNOSIS — Z7901 Long term (current) use of anticoagulants: Secondary | ICD-10-CM | POA: Diagnosis not present

## 2020-06-15 LAB — POCT INR: INR: 4.8 — AB (ref 2.0–3.0)

## 2020-06-15 NOTE — Patient Instructions (Signed)
-   skip warfarin today & tomorrow,  - START NEW DOSAGE of warfarin of 1/2 tablet every day while you are on the antibiotics - recheck INR in 1 week

## 2020-06-16 ENCOUNTER — Telehealth: Payer: Self-pay | Admitting: Cardiology

## 2020-06-16 NOTE — Telephone Encounter (Signed)
Spoke with pt, he is currently taking 40 mg of furosemide 3 times daily. He missed a full day of medication and now has swelling, that usually is better in the morning but has not been that way this week and SOB with mild exertion. He took 60 mg of furosemide this morning and has seen an increase in urine output but wants to get more direction on what to do. Instructed patient to take 80 mg at lunch today and 60 mg at the 4 pm dose today. If when he gets up in the morning his weight has not changed, he will take 80 mg in the morning and call the office. Pt agreed with this plan.

## 2020-06-16 NOTE — Telephone Encounter (Signed)
   Pt c/o swelling: STAT is pt has developed SOB within 24 hours  1) How much weight have you gained and in what time span? 4 lbs   2) If swelling, where is the swelling located?   3) Are you currently taking a fluid pill? Yes  4) Are you currently SOB? Yes  5) Do you have a log of your daily weights (if so, list)? From 139-141 lbs this week he weigh 146 lbs  6) Have you gained 3 pounds in a day or 5 pounds in a week? 4 lbs in a week  7) Have you traveled recently? No   Pt said he fell last week and had diverticulitis, he went to the ER and didn't get to drink his fluid pill all day, that's when he felt his fluid started to build up, he said he gets SOB with activity. He would like to ask Dr. [name] for recommendation on how to get rid of his fluid

## 2020-06-17 ENCOUNTER — Emergency Department: Payer: Medicare HMO

## 2020-06-17 ENCOUNTER — Telehealth: Payer: Self-pay | Admitting: Cardiology

## 2020-06-17 ENCOUNTER — Inpatient Hospital Stay
Admission: EM | Admit: 2020-06-17 | Discharge: 2020-06-23 | DRG: 291 | Disposition: A | Discharge status: Home Health | Payer: Medicare HMO | Attending: Internal Medicine | Admitting: Internal Medicine

## 2020-06-17 ENCOUNTER — Other Ambulatory Visit: Payer: Self-pay

## 2020-06-17 DIAGNOSIS — I5043 Acute on chronic combined systolic (congestive) and diastolic (congestive) heart failure: Secondary | ICD-10-CM | POA: Diagnosis present

## 2020-06-17 DIAGNOSIS — I130 Hypertensive heart and chronic kidney disease with heart failure and stage 1 through stage 4 chronic kidney disease, or unspecified chronic kidney disease: Secondary | ICD-10-CM | POA: Diagnosis not present

## 2020-06-17 DIAGNOSIS — Z20822 Contact with and (suspected) exposure to covid-19: Secondary | ICD-10-CM | POA: Diagnosis present

## 2020-06-17 DIAGNOSIS — I517 Cardiomegaly: Secondary | ICD-10-CM | POA: Diagnosis not present

## 2020-06-17 DIAGNOSIS — R110 Nausea: Secondary | ICD-10-CM | POA: Diagnosis not present

## 2020-06-17 DIAGNOSIS — I447 Left bundle-branch block, unspecified: Secondary | ICD-10-CM | POA: Diagnosis present

## 2020-06-17 DIAGNOSIS — Y92009 Unspecified place in unspecified non-institutional (private) residence as the place of occurrence of the external cause: Secondary | ICD-10-CM

## 2020-06-17 DIAGNOSIS — R0602 Shortness of breath: Secondary | ICD-10-CM | POA: Diagnosis not present

## 2020-06-17 DIAGNOSIS — I4821 Permanent atrial fibrillation: Secondary | ICD-10-CM | POA: Diagnosis present

## 2020-06-17 DIAGNOSIS — N400 Enlarged prostate without lower urinary tract symptoms: Secondary | ICD-10-CM | POA: Diagnosis present

## 2020-06-17 DIAGNOSIS — R531 Weakness: Secondary | ICD-10-CM | POA: Diagnosis not present

## 2020-06-17 DIAGNOSIS — Z7901 Long term (current) use of anticoagulants: Secondary | ICD-10-CM

## 2020-06-17 DIAGNOSIS — Z95810 Presence of automatic (implantable) cardiac defibrillator: Secondary | ICD-10-CM

## 2020-06-17 DIAGNOSIS — W19XXXA Unspecified fall, initial encounter: Secondary | ICD-10-CM | POA: Diagnosis present

## 2020-06-17 DIAGNOSIS — I10 Essential (primary) hypertension: Secondary | ICD-10-CM | POA: Diagnosis not present

## 2020-06-17 DIAGNOSIS — N179 Acute kidney failure, unspecified: Secondary | ICD-10-CM | POA: Diagnosis present

## 2020-06-17 DIAGNOSIS — E8770 Fluid overload, unspecified: Secondary | ICD-10-CM

## 2020-06-17 DIAGNOSIS — I272 Pulmonary hypertension, unspecified: Secondary | ICD-10-CM | POA: Diagnosis present

## 2020-06-17 DIAGNOSIS — M109 Gout, unspecified: Secondary | ICD-10-CM | POA: Diagnosis present

## 2020-06-17 DIAGNOSIS — I872 Venous insufficiency (chronic) (peripheral): Secondary | ICD-10-CM | POA: Diagnosis present

## 2020-06-17 DIAGNOSIS — F419 Anxiety disorder, unspecified: Secondary | ICD-10-CM | POA: Diagnosis present

## 2020-06-17 DIAGNOSIS — K2100 Gastro-esophageal reflux disease with esophagitis, without bleeding: Secondary | ICD-10-CM | POA: Diagnosis present

## 2020-06-17 DIAGNOSIS — Z803 Family history of malignant neoplasm of breast: Secondary | ICD-10-CM

## 2020-06-17 DIAGNOSIS — M47816 Spondylosis without myelopathy or radiculopathy, lumbar region: Secondary | ICD-10-CM | POA: Diagnosis not present

## 2020-06-17 DIAGNOSIS — N183 Chronic kidney disease, stage 3 unspecified: Secondary | ICD-10-CM | POA: Diagnosis present

## 2020-06-17 DIAGNOSIS — I5023 Acute on chronic systolic (congestive) heart failure: Secondary | ICD-10-CM

## 2020-06-17 DIAGNOSIS — K449 Diaphragmatic hernia without obstruction or gangrene: Secondary | ICD-10-CM | POA: Diagnosis not present

## 2020-06-17 DIAGNOSIS — K5900 Constipation, unspecified: Secondary | ICD-10-CM | POA: Diagnosis present

## 2020-06-17 DIAGNOSIS — I428 Other cardiomyopathies: Secondary | ICD-10-CM | POA: Diagnosis present

## 2020-06-17 DIAGNOSIS — G4733 Obstructive sleep apnea (adult) (pediatric): Secondary | ICD-10-CM | POA: Diagnosis present

## 2020-06-17 DIAGNOSIS — R296 Repeated falls: Secondary | ICD-10-CM | POA: Diagnosis present

## 2020-06-17 DIAGNOSIS — Z79899 Other long term (current) drug therapy: Secondary | ICD-10-CM

## 2020-06-17 DIAGNOSIS — R0902 Hypoxemia: Secondary | ICD-10-CM | POA: Diagnosis not present

## 2020-06-17 DIAGNOSIS — T502X5A Adverse effect of carbonic-anhydrase inhibitors, benzothiadiazides and other diuretics, initial encounter: Secondary | ICD-10-CM | POA: Diagnosis present

## 2020-06-17 DIAGNOSIS — Z9049 Acquired absence of other specified parts of digestive tract: Secondary | ICD-10-CM

## 2020-06-17 DIAGNOSIS — Z8249 Family history of ischemic heart disease and other diseases of the circulatory system: Secondary | ICD-10-CM

## 2020-06-17 DIAGNOSIS — I4892 Unspecified atrial flutter: Secondary | ICD-10-CM | POA: Diagnosis present

## 2020-06-17 DIAGNOSIS — Z7952 Long term (current) use of systemic steroids: Secondary | ICD-10-CM

## 2020-06-17 DIAGNOSIS — K5730 Diverticulosis of large intestine without perforation or abscess without bleeding: Secondary | ICD-10-CM | POA: Diagnosis not present

## 2020-06-17 DIAGNOSIS — Z7951 Long term (current) use of inhaled steroids: Secondary | ICD-10-CM

## 2020-06-17 DIAGNOSIS — Z66 Do not resuscitate: Secondary | ICD-10-CM | POA: Diagnosis present

## 2020-06-17 DIAGNOSIS — J449 Chronic obstructive pulmonary disease, unspecified: Secondary | ICD-10-CM | POA: Diagnosis present

## 2020-06-17 DIAGNOSIS — R0600 Dyspnea, unspecified: Secondary | ICD-10-CM | POA: Diagnosis not present

## 2020-06-17 DIAGNOSIS — I351 Nonrheumatic aortic (valve) insufficiency: Secondary | ICD-10-CM | POA: Diagnosis present

## 2020-06-17 DIAGNOSIS — I442 Atrioventricular block, complete: Secondary | ICD-10-CM | POA: Diagnosis present

## 2020-06-17 DIAGNOSIS — I482 Chronic atrial fibrillation, unspecified: Secondary | ICD-10-CM | POA: Diagnosis present

## 2020-06-17 DIAGNOSIS — I8390 Asymptomatic varicose veins of unspecified lower extremity: Secondary | ICD-10-CM | POA: Diagnosis present

## 2020-06-17 DIAGNOSIS — M4316 Spondylolisthesis, lumbar region: Secondary | ICD-10-CM | POA: Diagnosis not present

## 2020-06-17 DIAGNOSIS — E878 Other disorders of electrolyte and fluid balance, not elsewhere classified: Secondary | ICD-10-CM | POA: Diagnosis present

## 2020-06-17 DIAGNOSIS — E871 Hypo-osmolality and hyponatremia: Secondary | ICD-10-CM | POA: Diagnosis present

## 2020-06-17 DIAGNOSIS — R52 Pain, unspecified: Secondary | ICD-10-CM

## 2020-06-17 DIAGNOSIS — K219 Gastro-esophageal reflux disease without esophagitis: Secondary | ICD-10-CM | POA: Diagnosis present

## 2020-06-17 DIAGNOSIS — J90 Pleural effusion, not elsewhere classified: Secondary | ICD-10-CM | POA: Diagnosis not present

## 2020-06-17 DIAGNOSIS — K5792 Diverticulitis of intestine, part unspecified, without perforation or abscess without bleeding: Secondary | ICD-10-CM | POA: Diagnosis present

## 2020-06-17 LAB — URINALYSIS, COMPLETE (UACMP) WITH MICROSCOPIC
Bacteria, UA: NONE SEEN
Specific Gravity, Urine: 1.015 (ref 1.005–1.030)

## 2020-06-17 LAB — COMPREHENSIVE METABOLIC PANEL
ALT: 24 U/L (ref 0–44)
AST: 33 U/L (ref 15–41)
Albumin: 3.6 g/dL (ref 3.5–5.0)
Alkaline Phosphatase: 97 U/L (ref 38–126)
Anion gap: 8 (ref 5–15)
BUN: 39 mg/dL — ABNORMAL HIGH (ref 8–23)
CO2: 26 mmol/L (ref 22–32)
Calcium: 8.2 mg/dL — ABNORMAL LOW (ref 8.9–10.3)
Chloride: 90 mmol/L — ABNORMAL LOW (ref 98–111)
Creatinine, Ser: 1.6 mg/dL — ABNORMAL HIGH (ref 0.61–1.24)
GFR, Estimated: 42 mL/min — ABNORMAL LOW (ref >60)
Glucose, Bld: 124 mg/dL — ABNORMAL HIGH (ref 70–99)
Potassium: 5.6 mmol/L — ABNORMAL HIGH (ref 3.5–5.1)
Sodium: 124 mmol/L — ABNORMAL LOW (ref 135–145)
Total Bilirubin: 1.5 mg/dL — ABNORMAL HIGH (ref 0.3–1.2)
Total Protein: 6.1 g/dL — ABNORMAL LOW (ref 6.5–8.1)

## 2020-06-17 LAB — BRAIN NATRIURETIC PEPTIDE: B Natriuretic Peptide: 2366.8 pg/mL — ABNORMAL HIGH (ref 0.0–100.0)

## 2020-06-17 LAB — TROPONIN I (HIGH SENSITIVITY): Troponin I (High Sensitivity): 3 ng/L (ref <18)

## 2020-06-17 LAB — CBC WITH DIFFERENTIAL/PLATELET
Abs Immature Granulocytes: 0.09 10*3/uL — ABNORMAL HIGH (ref 0.00–0.07)
Basophils Absolute: 0 10*3/uL (ref 0.0–0.1)
Basophils Relative: 0 %
Eosinophils Absolute: 0 10*3/uL (ref 0.0–0.5)
Eosinophils Relative: 0 %
HCT: 39.2 % (ref 39.0–52.0)
Hemoglobin: 13.4 g/dL (ref 13.0–17.0)
Immature Granulocytes: 1 %
Lymphocytes Relative: 8 %
Lymphs Abs: 0.7 10*3/uL (ref 0.7–4.0)
MCH: 30.5 pg (ref 26.0–34.0)
MCHC: 34.2 g/dL (ref 30.0–36.0)
MCV: 89.3 fL (ref 80.0–100.0)
Monocytes Absolute: 0.4 10*3/uL (ref 0.1–1.0)
Monocytes Relative: 5 %
Neutro Abs: 7.8 10*3/uL — ABNORMAL HIGH (ref 1.7–7.7)
Neutrophils Relative %: 86 %
Platelets: 154 10*3/uL (ref 150–400)
RBC: 4.39 MIL/uL (ref 4.22–5.81)
RDW: 16.6 % — ABNORMAL HIGH (ref 11.5–15.5)
WBC: 9.1 10*3/uL (ref 4.0–10.5)
nRBC: 0 % (ref 0.0–0.2)

## 2020-06-17 LAB — LIPASE, BLOOD: Lipase: 47 U/L (ref 11–51)

## 2020-06-17 LAB — PROTIME-INR
INR: 1.9 — ABNORMAL HIGH (ref 0.8–1.2)
Prothrombin Time: 20.8 seconds — ABNORMAL HIGH (ref 11.4–15.2)

## 2020-06-17 MED ORDER — ALPRAZOLAM 0.25 MG PO TABS
0.2500 mg | ORAL_TABLET | Freq: Two times a day (BID) | ORAL | Status: DC | PRN
  Administered 2020-06-17 – 2020-06-22 (×6): 0.25 mg via ORAL
  Filled 2020-06-17 (×6): qty 1

## 2020-06-17 MED ORDER — WARFARIN - PHARMACIST DOSING INPATIENT: Freq: Every day | Status: DC

## 2020-06-17 MED ORDER — ACETAMINOPHEN 325 MG PO TABS
325.0000 mg | ORAL_TABLET | ORAL | Status: DC | PRN
  Administered 2020-06-18 – 2020-06-21 (×7): 325 mg via ORAL
  Filled 2020-06-17 (×7): qty 1

## 2020-06-17 MED ORDER — WARFARIN SODIUM 2.5 MG PO TABS
2.5000 mg | ORAL_TABLET | Freq: Once | ORAL | Status: AC
  Administered 2020-06-17: 2.5 mg via ORAL
  Filled 2020-06-17: qty 1

## 2020-06-17 MED ORDER — SODIUM CHLORIDE 0.9 % IV SOLN: 250.0000 mL | INTRAVENOUS | Status: DC | PRN

## 2020-06-17 MED ORDER — FUROSEMIDE 10 MG/ML IJ SOLN
60.0000 mg | Freq: Every day | INTRAMUSCULAR | Status: DC
  Administered 2020-06-18 – 2020-06-19 (×2): 60 mg via INTRAVENOUS
  Filled 2020-06-17 (×2): qty 6

## 2020-06-17 MED ORDER — FUROSEMIDE 10 MG/ML IJ SOLN
60.0000 mg | Freq: Once | INTRAMUSCULAR | Status: AC
  Administered 2020-06-17: 60 mg via INTRAVENOUS
  Filled 2020-06-17: qty 8

## 2020-06-17 MED ORDER — SODIUM CHLORIDE 0.9% FLUSH
3.0000 mL | Freq: Two times a day (BID) | INTRAVENOUS | Status: DC
  Administered 2020-06-18 – 2020-06-23 (×11): 3 mL via INTRAVENOUS

## 2020-06-17 MED ORDER — ONDANSETRON HCL 4 MG/2ML IJ SOLN: 4.0000 mg | INTRAMUSCULAR | Status: DC

## 2020-06-17 MED ORDER — ONDANSETRON HCL 4 MG/2ML IJ SOLN: 4.0000 mg | Freq: Four times a day (QID) | INTRAMUSCULAR | Status: DC | PRN

## 2020-06-17 MED ORDER — SODIUM CHLORIDE 0.9% FLUSH
3.0000 mL | INTRAVENOUS | Status: DC | PRN
Start: 2020-06-17 — End: 2020-06-23

## 2020-06-17 NOTE — Telephone Encounter (Signed)
Called patient back about his message. Patient stated he weighs 145.9 lbs today and his BLE is swollen. Patient was told yesterday to increase his Lasix to 80 mg in the AM if his weight had not gone down. Patient's weight was 146 lbs yesterday. Patient stated he is usually around 139 lbs. Patient is weighing himself daily at the same time,  keeping low salt diet, and he has good urine output. Patient wants to know if there is a medication we can add to help him get this fluid off. Will forward to Dr. [name] for advisement.

## 2020-06-17 NOTE — Telephone Encounter (Signed)
Follow Up:     Pt was told to call back today, if his weight was still up. He took the 80 mg of Furosemide this morning and his weight is 146.

## 2020-06-17 NOTE — ED Provider Notes (Signed)
[facility] Emergency Department Provider Note ____________________________________________   Event Date/Time   First MD Initiated Contact with Patient 06/17/20 1510     (approximate)  I have reviewed the triage vital signs and the nursing notes.  HISTORY  Chief Complaint Weakness  HPI [name] is a 87 y.o. male the history of congestive heart failure a flutter, recently seen and evaluated started treatment outpatient for diverticulitis.  He reports over the last 3 or so days he has felt like he is getting more swollen retaining fluid short of breath especially with any sort of exertion.  Primarily preferring to just lay in bed now feels very fatigued  He reports he just feels like he is got fluid buildup  No chest pain.  Reports some shortness of breath.  Leg swelling.  Feels like his abdomen is also a little bit swollen  No fevers.  Occasionally having some dry heaves and not able to keep his medications including furosemide down very well Past Medical History:  Diagnosis Date  . AICD (automatic cardioverter/defibrillator) present    s/p gen change 04/2015 w/ MDT [name] Crt-D  DTBA1D1, Serial number [id number]  . Allergic rhinitis    [name]  . Anemia   . Anxiety   . Arthritis   . Atrial flutter ([place])    A.  Status post cardioversion; B.  Tikosyn therapy - failed, remains in aflutter  . Bilateral pneumonia 11/02/2013   treated with levaquin  . BPH (benign prostatic hyperplasia)   . 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 Lucky Cowboy)  . Chronic lung disease    spirometry 2015 - no obstruction, + mild restrictive lung disease  . Chronic sinusitis   . Chronic systolic congestive heart failure ([place])   . Dyspnea    with exertion  . Extrinsic asthma    [name]  . Fall with injury 05/28/2017  . GERD (gastroesophageal reflux disease) 2010   h/o esophageal stricture with dilation, LA grade C reflux esophagitis by EGD 2010   . Goiter   . History of diverticulitis of colon 06/2020  . History of GI bleed    Secondary to hemorrhoids  . History of seasonal allergies   . Hypertension   . IBS (irritable bowel syndrome)   . LBBB (left bundle branch block)   . Multiple pulmonary nodules 06/2013   RUL (Dr. Adam Phenix at [facility]) - ?vasculitis as of last CT at [facility]  . NICM (nonischemic cardiomyopathy) [facility])    Cardiac catheterization March 2006 without coronary disease; EF 25% 2018 Jan  . Orthopnea   . 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 Presence of permanent cardiac pacemaker   . Sinus congestion 09/25/2010    Patient Active Problem List   Diagnosis Date Noted  . Chronic atrial fibrillation ([place]) 02/16/2020  . Prediabetes 11/22/2019  . Pedal edema 11/16/2019  . Decreased visual acuity 11/16/2019  . Low back pain radiating to right lower extremity 07/09/2019  . Health maintenance examination 11/11/2018  . Chronic lung disease   . Nausea 11/04/2017  . Abnormal x-ray 10/28/2017  . Pulmonary HTN ([place]) 03/25/2017  . Aortic valve regurgitation 03/25/2017  . Other fatigue 12/04/2016  . Nondisplaced fracture of greater trochanter of left femur ([place]) 10/30/2016  . Status post reverse total arthroplasty of right shoulder 01/17/2016  . Complete tear of right rotator cuff 12/10/2015  . Acute sinusitis 03/18/2015  . Complete tear of left rotator cuff 05/07/2014  .  Warfarin anticoagulation 05/07/2014  . Advanced care planning/counseling discussion 03/16/2014  . Constipation 09/09/2013  . Hyperglycemia 09/09/2013  . Medicare annual wellness visit, subsequent 04/22/2012  . 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 Left sided sciatica 06/11/2011  . Thrombocytopenia ([place]) 02/25/2011  . Vitamin B12 deficiency 02/22/2011  . Congestion of respiratory tract 09/25/2010  . Long term current use of anticoagulant 06/27/2010  . ESOPHAGEAL STRICTURE 02/16/2010  . CHEST PAIN 01/03/2010  . Atrial flutter ([place]) 12/20/2008  .  Anxiety 12/08/2008  . Secondary cardiomyopathy ([place]) 09/13/2008  . LBBB 09/13/2008  . Automatic implantable cardioverter-defibrillator in situ 09/13/2008  . PORPHYRIA 12/16/2006  . Chronic systolic heart failure ([place]) 12/16/2006  . Allergic rhinitis 12/16/2006  . GERD 12/16/2006  . BENIGN PROSTATIC HYPERTROPHY 12/16/2006  . IBS 12/08/1997    Past Surgical History:  Procedure Laterality Date  . BACK SURGERY  1997   bulging disks  . BiV-AICD implant     [employer]  . CARDIAC CATHETERIZATION  06/22/04   Severe, nonischemic cardiomyopathy,EF 25-30%  . CARDIOVERSION  02/22/09   AFlutter-([facility])  . CATARACT EXTRACTION W/PHACO Right 12/21/2019   Procedure: CATARACT EXTRACTION PHACO AND INTRAOCULAR LENS PLACEMENT (IOC) RIGHT 2.23  00:21.9;  Surgeon: Eulogio Bear, MD;  Location: [place];  Service: Ophthalmology;  Laterality: Right;  . CATARACT EXTRACTION W/PHACO Left 02/01/2020   Procedure: CATARACT EXTRACTION PHACO AND INTRAOCULAR LENS PLACEMENT (IOC) LEFT 3.67 00:39.5;  Surgeon: Eulogio Bear, MD;  Location: [place];  Service: Ophthalmology;  Laterality: Left;  . CHOLECYSTECTOMY    . COLONOSCOPY  10/99   Divertic, splenic, hepatic fleure only  . COLONOSCOPY  10/21/08   aborted-divertics, int hemms (Dr. Vira Agar)  . CORONARY ANGIOPLASTY    . EP IMPLANTABLE DEVICE N/A 04/25/2015   Procedure: BIV ICD Generator Changeout;  Surgeon: Evans Lance, MD;  Location: [place] [facility];  Service: Cardiovascular;  Laterality: N/A;  . ESOPHAGEAL DILATION  02/18/98  . ESOPHAGOGASTRODUODENOSCOPY  01/1998   stricture, sliding HH, GERD  . ESOPHAGOGASTRODUODENOSCOPY  04/08/01   stricture, gastritis, HH, GERD-no dilation(Dr. Henrene Pastor)  . ESOPHAGOGASTRODUODENOSCOPY  12/22/04   stricture; gastritis; duodenitis, GERD  . ESOPHAGOGASTRODUODENOSCOPY  10/21/08   Reflux esophagitis; Erythem. Duod. (Dr. Vira Agar)  . ESOPHAGOGASTRODUODENOSCOPY  08/2013   erythema gastric fundus -  minimal gastritis, HH ([place])  . ESOPHAGOGASTRODUODENOSCOPY  08/2016   dysphagia - mod schatzki rin, dilated Tiffany Kocher)  . ESOPHAGOGASTRODUODENOSCOPY (EGD) WITH PROPOFOL N/A 08/24/2016   mod schatzki ring dilated, duodenal deformity Vira Agar, Gavin Pound, MD)  . goiter removal    . HERNIA REPAIR    . HERNIA REPAIR  01/30/02   Dr. Hassell Done  . INSERT / REPLACE / REMOVE PACEMAKER  2007 and 2017  . JOINT REPLACEMENT Right    shoulder  . NOSE SURGERY  1971   sinus surgery  . PENILE PROSTHESIS IMPLANT  2007   [place]  . PFT  10/2013   FVC 61%, FEV1 63%, ratio 0.76  . Pulmonary eval  04/2002   ([facility]) Chronic congestive symptoms  . REVERSE SHOULDER ARTHROPLASTY Right 01/17/2016   Procedure: REVERSE SHOULDER ARTHROPLASTY;  Surgeon: Corky Mull, MD;  Location: [facility];  Service: Orthopedics;  Laterality: Right;  . REVERSE TOTAL SHOULDER ARTHROPLASTY Right 01/2016   Poggi  . US ECHOCARDIOGRAPHY  07/13/04   EF 25-30%, Mod LVH; LA severe dilation; Mild AR; IRTR  . US ECHOCARDIOGRAPHY  11/27/06   hypokinesis posterior wall, EF 35%; mild AR    Prior to Admission  medications   Medication Sig Start Date End Date Taking? Authorizing Provider  acetaminophen (TYLENOL) 500 MG tablet Take 250 mg by mouth daily as needed. Pain     [provider]  ALPRAZolam Duanne Moron) 0.25 MG tablet TAKE 1 TABLET BY MOUTH EVERYDAY AT BEDTIME 05/23/20   Ria Bush, MD  alum & mag hydroxide-simeth (MAALOX/MYLANTA) 200-200-20 MG/5ML suspension Take 15 mLs by mouth daily as needed for indigestion or heartburn.    [provider]  amoxicillin-clavulanate (AUGMENTIN) 875-125 MG tablet Take 1 tablet by mouth every 8 (eight) hours for 7 days. 06/14/20 06/21/20  Vladimir Crofts, MD  budesonide-formoterol [facility]) 160-4.5 MCG/ACT inhaler Inhale 2 puffs into the lungs at bedtime.    [provider]  carvedilol (COREG) 6.25 MG tablet TAKE 1 AND 1/2 TABLETS BY MOUTH TWICE DAILY 04/04/20   [name], MD   fluticasone (FLONASE) 50 MCG/ACT nasal spray Place 1 spray into both nostrils daily.    [provider]  furosemide (LASIX) 40 MG tablet Take 1.5 tablets (60 mg total) by mouth 2 (two) times daily. 02/23/20 05/23/20  [name], MD  guaiFENesin 200 MG tablet Take 400 mg by mouth every 4 (four) hours as needed for cough or to loosen phlegm.    [provider]  levalbuterol Penne Lash HFA) 45 MCG/ACT inhaler Inhale 1-2 puffs into the lungs every 6 (six) hours as needed for wheezing or shortness of breath. 08/31/14   Ria Bush, MD  Loratadine 10 MG CAPS Take 1 capsule by mouth daily as needed (allergies).    [provider]  montelukast (SINGULAIR) 10 MG tablet Take 10 mg by mouth daily as needed (BREATHING).     [provider]  polyethylene glycol (MIRALAX / GLYCOLAX) packet Take 17 g by mouth daily as needed (constipation).     [provider]  predniSONE (DELTASONE) 5 MG tablet Take 1 tablet (5 mg total) by mouth 2 (two) times daily with a meal. 05/20/20   Olalere, Adewale A, MD  tamsulosin (FLOMAX) 0.4 MG CAPS capsule TAKE 1 CAPSULE BY MOUTH EVERY DAY 05/20/20   Billey Co, MD  vitamin B-12 (CYANOCOBALAMIN) 500 MCG tablet Take 2 tablets (1,000 mcg total) by mouth daily. 11/16/19   Ria Bush, MD  warfarin (COUMADIN) 5 MG tablet TAKE 1/2 TO 1 TABLET BY MOUTH DAILY AS DIRECTED BY COUMADIN CLINIC 12/30/19   Evans Lance, MD    Allergies Carafate [sucralfate], Clarithromycin, Famotidine, Levaquin [levofloxacin], Omeprazole, Oxytetracycline, Penicillins, Proton pump inhibitors, Ranitidine, and Doxycycline  Family History  Problem Relation Age of Onset  . Coronary artery disease Father   . Hypertension Father   . Heart failure Mother   . Dysphagia Sister   . Breast cancer Other   . Ovarian cancer Other   . Uterine cancer Other   . Other Sister        stomach problems  . Other Sister        stomach problems  . Other Sister         stomach problems  . Alcohol abuse Maternal Uncle     Social History Social History   Tobacco Use  . Smoking status: Never Smoker  . Smokeless tobacco: Never Used  Vaping Use  . Vaping Use: Never used  Substance Use Topics  . Alcohol use: No  . Drug use: No    Review of Systems Constitutional: No fever/chills Eyes: No visual changes. ENT: No sore throat. Cardiovascular: Denies chest pain. Respiratory: See HPI Gastrointestinal: No  abdominal pain.   Genitourinary: Slight dysuria Musculoskeletal: Negative for back pain. Skin: Negative for rash. Neurological: Negative for headaches, areas of focal weakness or numbness.    ____________________________________________   PHYSICAL EXAM:  VITAL SIGNS: ED Triage Vitals  Enc Vitals Group     BP 06/17/20 1550 103/69     Pulse Rate 06/17/20 1550 60     Resp 06/17/20 1550 (!) 23     Temp 06/17/20 1550 (!) 97.4 F (36.3 C)     Temp Source 06/17/20 1550 Oral     SpO2 06/17/20 1549 100 %     Weight 06/17/20 1552 146 lb (66.2 kg)     Height 06/17/20 1552 5\' 4"  (1.626 m)     Head Circumference --      Peak Flow --      Pain Score --      Pain Loc --      Pain Edu? --      Excl. in [place]? --     Constitutional: Alert and oriented.  Mildly ill-appearing, little bit pale in complexion.  No acute distress though.  Appears slightly tachypneic Eyes: Conjunctivae are normal. Head: Atraumatic. Nose: No congestion/rhinnorhea. Mouth/Throat: Mucous membranes are moist. Neck: No stridor.  Cardiovascular: Normal rate, regular rhythm. Grossly normal heart sounds.  Good peripheral circulation. Respiratory: Very mildly tachypneic.  Mild accessory muscle use when he speaks in phrases.  When he does speaks on room air his oxygen saturation noted dropped down to about 87%.  I placed him on 2 L nasal cannula with resultant improvement in saturations of the high 90s to 100.  No retractions. Lungs CTAB though diminished in the lower  bilateral. Gastrointestinal: Soft and nontender. No distention.  Denies ongoing pain or discomfort.  No rebound or tenderness.  No pain left lower quadrant Musculoskeletal: No lower extremity tenderness with bilateral lower extremity edema Neurologic:  Normal speech and language. No gross focal neurologic deficits are appreciated.  Skin:  Skin is warm, dry and intact. No rash noted.  He is holding off oxygen he actually appears just a little bit cyanotic in his nailbeds, this improves after placing him on oxygen Psychiatric: Mood and affect are normal. Speech and behavior are normal.  ____________________________________________   LABS (all labs ordered are listed, but only abnormal results are displayed)  Labs Reviewed  CBC WITH DIFFERENTIAL/PLATELET - Abnormal; Notable for the following components:      Result Value   RDW 16.6 (*)    Neutro Abs 7.8 (*)    Abs Immature Granulocytes 0.09 (*)    All other components within normal limits  COMPREHENSIVE METABOLIC PANEL - Abnormal; Notable for the following components:   Sodium 124 (*)    Potassium 5.6 (*)    Chloride 90 (*)    Glucose, Bld 124 (*)    BUN 39 (*)    Creatinine, Ser 1.60 (*)    Calcium 8.2 (*)    Total Protein 6.1 (*)    Total Bilirubin 1.5 (*)    GFR, Estimated 42 (*)    All other components within normal limits  BRAIN NATRIURETIC PEPTIDE - Abnormal; Notable for the following components:   B Natriuretic Peptide 2,366.8 (*)    All other components within normal limits  SARS CORONAVIRUS 2 (TAT 6-24 HRS)  LIPASE, BLOOD  URINALYSIS, COMPLETE (UACMP) WITH MICROSCOPIC  TROPONIN I (HIGH SENSITIVITY)   ____________________________________________  EKG  Reviewed interpreted at 735 Heart rate 69 QRS 149 QTc 500 Normal sinus rhythm,  intraventricular conduction delay, possibly old inferior Q waves.  Mild ST segment depression V2 and V3.  Some elements of morphology look potentially of that if of bundle branch block, and  suspect pacemaker spikes and atrial flutter ____________________________________________  RADIOLOGY  CT ABDOMEN PELVIS WO CONTRAST  Result Date: 06/17/2020 CLINICAL DATA:  Bloated the generalized abdominal pain. EXAM: CT ABDOMEN AND PELVIS WITHOUT CONTRAST TECHNIQUE: Multidetector CT imaging of the abdomen and pelvis was performed following the standard protocol without IV contrast. COMPARISON:  June 14, 2020 FINDINGS: Lower chest: There is moderate severity cardiomegaly with a small pericardial effusion. Mild atelectasis and/or infiltrate is seen within the bilateral lung bases, right greater than left. Small to moderate sized bilateral pleural effusions are noted. Hepatobiliary: No focal liver abnormality is seen. Status post cholecystectomy. No biliary dilatation. Pancreas: Unremarkable. No pancreatic ductal dilatation or surrounding inflammatory changes. Spleen: Normal in size without focal abnormality. Adrenals/Urinary Tract: Adrenal glands are unremarkable. Kidneys are normal, without renal calculi, focal lesion, or hydronephrosis. Mildly diluted contrast is seen within the lumen of an otherwise normal appearing urinary bladder. Stomach/Bowel: There is a small hiatal hernia. Appendix appears normal. No evidence of bowel dilatation. A large amount of stool is seen throughout the colon. Numerous noninflamed diverticula are seen throughout the large bowel. The mildly inflamed diverticulum seen within the sigmoid colon the prior study is no longer visualized. Vascular/Lymphatic: Aortic atherosclerosis. 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 No enlarged abdominal or pelvic lymph nodes. Reproductive: Prostate is unremarkable. A penile pump and associated reservoir are seen. Other: A 3.9 cm x 2.8 cm left inguinal hernia containing fluid and fat is seen. No abdominopelvic ascites. Musculoskeletal: Approximately 3 mm retrolisthesis of the L2 vertebral body is noted on L3. Multilevel  degenerative changes seen throughout the lumbar spine. IMPRESSION: 1. Small to moderate sized bilateral pleural effusions, right greater than left. 2. Mild bibasilar atelectasis and/or infiltrate, right greater than left. 3. Moderate severity cardiomegaly with a small pericardial effusion. 4. Colonic diverticulosis. 5. Large stool burden without evidence of bowel obstruction. 6. Small hiatal hernia. 7. Approximately 3 mm retrolisthesis of the L2 vertebral body on L3 with multilevel degenerative changes throughout the lumbar spine. 8. Aortic atherosclerosis. Aortic Atherosclerosis (ICD10-I70.0). Electronically Signed   By: Virgina Norfolk M.D.   On: 06/17/2020 16:03   DG Chest 2 View  Result Date: 06/17/2020 CLINICAL DATA:  Dyspnea.  Extremity swelling. EXAM: CHEST - 2 VIEW COMPARISON:  Most recent CT 11/04/2017. Lung bases from abdominal CT earlier today reviewed. FINDINGS: Multi lead left-sided pacemaker in place. The heart is enlarged. Stable mediastinal contours. Small bilateral pleural effusions. Mild vascular congestion without pulmonary edema. No confluent airspace disease. No pneumothorax. Right shoulder arthroplasty, alignment not well assessed on the current exam. There is a E minimally displaced distal right clavicle fracture, age indeterminate but new from 2019. Chronic superior subluxation of the humeral head. IMPRESSION: 1. Cardiomegaly with vascular congestion and small bilateral pleural effusions. Findings consistent with mild CHF. 2. Minimally displaced distal right clavicle fracture is age indeterminate but new from 2019. Recommend correlation with history of interval injury and focal pain. Electronically Signed   By: Keith Rake M.D.   On: 06/17/2020 16:06    Multiple findings noted on CT scan.  As reviewed by me, most notable findings that ICR bilateral pleural effusions it seems to be getting larger.  Small pericardial effusion.  Possible atelectasis or early infiltrate right  greater than left.   ____________________________________________  PROCEDURES  Procedure(s) performed: None  Procedures  Critical Care performed: No  ____________________________________________   INITIAL IMPRESSION / ASSESSMENT AND PLAN / ED COURSE  Pertinent labs & imaging results that were available during my care of the patient were reviewed by me and considered in my medical decision making (see chart for details).   Patient presents for shortness of breath fatigue appears volume overloaded by clinical history.  Also it seems that his symptoms of diverticulitis are generally improving he has a very reassuring abdominal exam afebrile.  He does report however feeling like he is retching frequently not at this point and is not feeling nauseated or in pain at this time but at home he reports when he tries to eat or keep medications down he stands feel nauseated does not want to eat.  He does appear volume overload reports increasing weight increasing swelling trying to take his Lasix at home but reports he just feels like it is not being effective or going through him.  Clinical Course as of 06/17/20 1700  Fri Jun 17, 2020  1635 Sodium(!): 124 [MQ]  1636 Potassium(!): 5.6 Labs notable for hyponatremia which I suspect is due to volume overload.  Mild elevation of potassium also noted. [MQ]  9604 Reviewed patient's chest x-ray, findings concerning for volume overload [MQ]    Clinical Course User Index [MQ] Delman Kitten, MD   ----------------------------------------- 5:08 PM on 06/17/2020 -----------------------------------------  Provide moderate diuresis, he does appear volume overloaded but also some mild hypotension.  We will need to monitor his blood pressures closely with diuresis.  Discussed admission with Dr. Tobie Poet of the hospitalist service.  Patient to be admitted for further care and work-up.  Discussed with both the patient and his friends at the bedside, agreeable with  his plan for admission.  He is awake alert no acute distress at this time but does have a mild oxygen requirement about 2 L  ____________________________________________   FINAL CLINICAL IMPRESSION(S) / ED DIAGNOSES  Final diagnoses:  Hypervolemia, unspecified hypervolemia type        Note:  This document was prepared using Dragon voice recognition software and may include unintentional dictation errors       Delman Kitten, MD 06/18/20 0006

## 2020-06-17 NOTE — Progress Notes (Signed)
ANTICOAGULATION CONSULT NOTE  Pharmacy Consult for Warfarin Indication: atrial fibrillation    Patient Measurements: Height: 5\' 4"  (162.6 cm) Weight: 66.2 kg (146 lb) IBW/kg (Calculated) : 59.2  Vital Signs: Temp: 98 F (36.7 C) (03/11 2038) Temp Source: Oral (03/11 2038) BP: 122/77 (03/11 2038) Pulse Rate: 67 (03/11 2038)  Labs: Recent Labs    06/15/20 1001 06/17/20 1555 06/17/20 2005  HGB  --  13.4  --   HCT  --  39.2  --   PLT  --  154  --   LABPROT  --   --  20.8*  INR 4.8*  --  1.9*  CREATININE  --  1.60*  --   TROPONINIHS  --  3  --     Estimated Creatinine Clearance: 27.8 mL/min (A) (by C-G formula based on SCr of 1.6 mg/dL (H)).   Medical History: Past Medical History:  Diagnosis Date  . AICD (automatic cardioverter/defibrillator) present    s/p gen change 04/2015 w/ MDT [name] Crt-D  DTBA1D1, Serial number [id number]  . Allergic rhinitis    [name]  . Anemia   . Anxiety   . Arthritis   . Atrial flutter ([place])    A.  Status post cardioversion; B.  Tikosyn therapy - failed, remains in aflutter  . Bilateral pneumonia 11/02/2013   treated with levaquin  . BPH (benign prostatic hyperplasia)   . 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 Lucky Cowboy)  . Chronic lung disease    spirometry 2015 - no obstruction, + mild restrictive lung disease  . Chronic sinusitis   . Chronic systolic congestive heart failure ([place])   . Dyspnea    with exertion  . Extrinsic asthma    [name]  . Fall with injury 05/28/2017  . GERD (gastroesophageal reflux disease) 2010   h/o esophageal stricture with dilation, LA grade C reflux esophagitis by EGD 2010  . Goiter   . History of diverticulitis of colon 06/2020  . History of GI bleed    Secondary to hemorrhoids  . History of seasonal allergies   . Hypertension   . IBS (irritable bowel syndrome)   . LBBB (left bundle branch block)   . Multiple pulmonary nodules 06/2013   RUL ([facility] Adam Phenix at [facility]) - ?vasculitis as of last CT at [facility]  . NICM (nonischemic cardiomyopathy) [facility])    Cardiac catheterization March 2006 without coronary disease; EF 25% 2018 Jan  . Orthopnea   . 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 Presence of permanent cardiac pacemaker   . Sinus congestion 09/25/2010    Medications:  Warfarin 2.5 mg on Mondays; 5mg  on all other days (TWD = 32.5 mg) *pt instructed to take 2.5mg  daily from 3/11-3/18/22 while on amoxicillin Pt reports last dose on 06/13/20 - 2.5mg    Assessment: 87yo male with history of congestive heart failure, A flutter, recently seen and started treatment outpatient for diverticulitis. Pt's INR from most recent admission 06/14/20 was 3.9, then 4.8 on 06/15/20; he did not receive warfarin during that admission. Pharmacy has been consulted for warfarin dosing and monitoring. Pt reports his last dose of warfarin was on 2.5mg  on 06/13/20. H&H and plt WNL.   Date INR Dose 3/7 -- 2.5 mg (per patient)  3/8 3.9 Held  (per patient)  3/9 4.8 Held  (per patient)  3/10 -- Held  (per patient)  3/11 1.9 2.5 mg   Goal of Therapy:  INR 2-3 Monitor  platelets by anticoagulation protocol: Yes   Plan:   Patient INR 1.9 after holding last 3 days. Per patient, was instructed to take 2.5 mg daily for 1 week for new outpatient antibiotic amoxicillin (3/11-3/18)  Will give patient one time dose of 2.5 mg tonight and recheck INR in AM   Dorothe Pea, PharmD, BCPS Clinical Pharmacist  06/17/2020 9:19 PM

## 2020-06-17 NOTE — H&P (Addendum)
History and Physical   KYLAR LEONHARDT HGD:[id number] DOB: [birth year] DOA: 06/17/2020  PCP: Ria Bush, MD  Outpatient Specialists: [facility], Dr. [name] Patient coming from: home  I have personally briefly reviewed patient's old medical records in [place].  Chief Concern: shortness of breath  HPI: [name] is a 87 y.o. male with medical history significant for hypertension, anxiety, atrial flutter, aortic valve regurgitation, BPH, chronic combined heart failure, left bundle branch block, pulmonary hypertension, presented to the emergency department for chief concerns of shortness of breath.  He reports worsening shortness of breath over 1 week from baseline.  He endorses that he has gained 5-6 pounds in one week.  He denies increased p.o. intake of fluids.  He endorses compliance with his Lasix medication, however reports that he has had decreased urination. He feels the Lasix has not been working for him.  He denies chest pain, fever, chills, cough, nausea, vomiting, diarrhea, dysuria, hematuria.  Social: lives with spouse. Retired and formerly worked in [employer]. He has never smoked tobacco. He denies etoh and recreational drug use.  ROS: Constitutional: + weight change, no fever ENT/Mouth: no sore throat, no rhinorrhea Eyes: no eye pain, no vision changes Cardiovascular: no chest pain, + dyspnea,  + edema, no palpitations Respiratory: no cough, no sputum, no wheezing Gastrointestinal: no nausea, no vomiting, no diarrhea, no constipation Genitourinary: no urinary incontinence, no dysuria, no hematuria Musculoskeletal: no arthralgias, no myalgias Skin: no skin lesions, no pruritus, Neuro: + weakness, no loss of consciousness, no syncope Psych: no anxiety, no depression, no decrease appetite Heme/Lymph: no bruising, no bleeding  ED Course: Discussed with ED provider, patient requiring hospitalization due to worsening  shortness of breath.  Vitals in the ED was afebrile with temperature of 97.4, respiration rate of 27, heart rate of 62, blood pressure 105/67.  Satting at 89% on room air.  Patient was placed on 2 L nasal cannula.  CT abdomen pelvis without contrast showed moderate-sized bilateral pleural effusion, right greater than left.  Labs in the ED was remarkable for serum sodium 124, potassium 5.6, chloride 90, serum creatinine 1.6, BNP greater than 2300, initial troponin was 3.  Assessment/Plan  Principal Problem:   Shortness of breath Active Problems:   Anxiety   Atrial flutter (HCC)   GERD   Automatic implantable cardioverter-defibrillator in situ   Warfarin anticoagulation   Pulmonary HTN (HCC)   Aortic valve regurgitation   Chronic atrial fibrillation (HCC)   Shortness of breath with weight gain and lower extremity edema Small to moderate by lateral pleural effusion, right greater than left -BNP 2366 -Heart failure exacerbation -Lasix 60 mg IV once per ED provider -Lasix 60 mg IV daily -Strict I's and O's -Hospitalist to reassess volume status -A.m. team to consult cardiology for heart failure medication modification and optimization if patient does not improve per day hospitalist clinical judgment  Hyponatremia and hypochloremia-suspect secondary to hypervolemia, treat as above  Combined heart failure-Coreg 9.375 mg p.o. twice daily resumed -Lasix as above  Atrial flutter/fibrillation-warfarin per pharmacy OSA and pulmonary hypertension-CPAP nightly ordered Anxiety-alprazolam 0.25 mg twice daily as needed for anxiety resumed Right shoulder ecchymosis-present on admission status post fall days ago prior to presentation, no head trauma  Chart reviewed.   Echo on 04/22/2017: EF 20 to 25%, pulmonary hypertension, grade 2 diastolic dysfunction  DVT prophylaxis: Warfarin Code Status: DNR Diet: Heart healthy Family Communication: Updated spouse at bedside Disposition Plan:  Pending clinical course Consults called:  None at this time Admission status: Observation, progressive cardiac  Past Medical History:  Diagnosis Date  . AICD (automatic cardioverter/defibrillator) present    s/p gen change 04/2015 w/ MDT [name] Crt-D  DTBA1D1, Serial number [id number]  . Allergic rhinitis    [name]  . Anemia   . Anxiety   . Arthritis   . Atrial flutter ([place])    A.  Status post cardioversion; B.  Tikosyn therapy - failed, remains in aflutter  . Bilateral pneumonia 11/02/2013   treated with levaquin  . BPH (benign prostatic hyperplasia)   . 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 Lucky Cowboy)  . Chronic lung disease    spirometry 2015 - no obstruction, + mild restrictive lung disease  . Chronic sinusitis   . Chronic systolic congestive heart failure ([place])   . Dyspnea    with exertion  . Extrinsic asthma    [name]  . Fall with injury 05/28/2017  . GERD (gastroesophageal reflux disease) 2010   h/o esophageal stricture with dilation, LA grade C reflux esophagitis by EGD 2010  . Goiter   . History of diverticulitis of colon 06/2020  . History of GI bleed    Secondary to hemorrhoids  . History of seasonal allergies   . Hypertension   . IBS (irritable bowel syndrome)   . LBBB (left bundle branch block)   . Multiple pulmonary nodules 06/2013   RUL ([facility] Adam [facility]ix [facility]) - ?vasculitis as of last CT at [facility]  . NICM (nonischemic cardiomyopathy) [facility])    Cardiac catheterization March 2006 without coronary disease; EF 25% 2018 Jan  . Orthopnea   . 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 Presence of permanent cardiac pacemaker   . Sinus congestion 09/25/2010   Past Surgical History:  Procedure Laterality Date  . BACK SURGERY  1997   bulging disks  . BiV-AICD implant     [employer]  . CARDIAC CATHETERIZATION  06/22/04   Severe, nonischemic cardiomyopathy,EF 25-30%  . CARDIOVERSION  02/22/09   AFlutter-([facility])  . CATARACT EXTRACTION W/PHACO Right 12/21/2019    Procedure: CATARACT EXTRACTION PHACO AND INTRAOCULAR LENS PLACEMENT (IOC) RIGHT 2.23  00:21.9;  Surgeon: Eulogio Bear, MD;  Location: [place];  Service: Ophthalmology;  Laterality: Right;  . CATARACT EXTRACTION W/PHACO Left 02/01/2020   Procedure: CATARACT EXTRACTION PHACO AND INTRAOCULAR LENS PLACEMENT (IOC) LEFT 3.67 00:39.5;  Surgeon: Eulogio Bear, MD;  Location: [place];  Service: Ophthalmology;  Laterality: Left;  . CHOLECYSTECTOMY    . COLONOSCOPY  10/99   Divertic, splenic, hepatic fleure only  . COLONOSCOPY  10/21/08   aborted-divertics, int hemms (Dr. Vira Agar)  . CORONARY ANGIOPLASTY    . EP IMPLANTABLE DEVICE N/A 04/25/2015   Procedure: BIV ICD Generator Changeout;  Surgeon: Evans Lance, MD;  Location: [place] [facility];  Service: Cardiovascular;  Laterality: N/A;  . ESOPHAGEAL DILATION  02/18/98  . ESOPHAGOGASTRODUODENOSCOPY  01/1998   stricture, sliding HH, GERD  . ESOPHAGOGASTRODUODENOSCOPY  04/08/01   stricture, gastritis, HH, GERD-no dilation(Dr. Henrene Pastor)  . ESOPHAGOGASTRODUODENOSCOPY  12/22/04   stricture; gastritis; duodenitis, GERD  . ESOPHAGOGASTRODUODENOSCOPY  10/21/08   Reflux esophagitis; Erythem. Duod. (Dr. Vira Agar)  . ESOPHAGOGASTRODUODENOSCOPY  08/2013   erythema gastric fundus - minimal gastritis, HH ([place])  . ESOPHAGOGASTRODUODENOSCOPY  08/2016   dysphagia - mod schatzki rin, dilated Tiffany Kocher)  . ESOPHAGOGASTRODUODENOSCOPY (EGD) WITH PROPOFOL N/A 08/24/2016   mod schatzki ring dilated, duodenal deformity Vira Agar,  Gavin Pound, MD)  . goiter removal    . HERNIA REPAIR    . HERNIA REPAIR  01/30/02   Dr. Hassell Done  . INSERT / REPLACE / REMOVE PACEMAKER  2007 and 2017  . JOINT REPLACEMENT Right    shoulder  . NOSE SURGERY  1971   sinus surgery  . PENILE PROSTHESIS IMPLANT  2007   [place]  . PFT  10/2013   FVC 61%, FEV1 63%, ratio 0.76  . Pulmonary eval  04/2002   ([facility]) Chronic congestive symptoms  . REVERSE SHOULDER ARTHROPLASTY  Right 01/17/2016   Procedure: REVERSE SHOULDER ARTHROPLASTY;  Surgeon: Corky Mull, MD;  Location: [facility];  Service: Orthopedics;  Laterality: Right;  . REVERSE TOTAL SHOULDER ARTHROPLASTY Right 01/2016   [place]  . US ECHOCARDIOGRAPHY  07/13/04   EF 25-30%, Mod LVH; LA severe dilation; Mild AR; IRTR  . US ECHOCARDIOGRAPHY  11/27/06   hypokinesis posterior wall, EF 35%; mild AR   Social History:  reports that he has never smoked. He has never used smokeless tobacco. He reports that he does not drink alcohol and does not use drugs.  Allergies: Penicillins causes swelling, multiple other allergies please refer to allergies  Family History  Problem Relation Age of Onset  . Coronary artery disease Father   . Hypertension Father   . Heart failure Mother   . Dysphagia Sister   . Breast cancer Other   . Ovarian cancer Other   . Uterine cancer Other   . Other Sister        stomach problems  . Other Sister        stomach problems  . Other Sister        stomach problems  . Alcohol abuse Maternal Uncle    Family history: Family history reviewed and not pertinent  Prior to Admission medications   Medication Sig Start Date End Date Taking? Authorizing Provider  acetaminophen (TYLENOL) 500 MG tablet Take 250 mg by mouth daily as needed. Pain     [provider]  ALPRAZolam Duanne Moron) 0.25 MG tablet TAKE 1 TABLET BY MOUTH EVERYDAY AT BEDTIME 05/23/20   Ria Bush, MD  alum & mag hydroxide-simeth (MAALOX/MYLANTA) 200-200-20 MG/5ML suspension Take 15 mLs by mouth daily as needed for indigestion or heartburn.    [provider]  amoxicillin-clavulanate (AUGMENTIN) 875-125 MG tablet Take 1 tablet by mouth every 8 (eight) hours for 7 days. 06/14/20 06/21/20  Vladimir Crofts, MD  budesonide-formoterol [facility]) 160-4.5 MCG/ACT inhaler Inhale 2 puffs into the lungs at bedtime.    [provider]  carvedilol (COREG) 6.25 MG tablet TAKE 1 AND 1/2 TABLETS BY MOUTH TWICE DAILY  04/04/20   [name], MD  fluticasone (FLONASE) 50 MCG/ACT nasal spray Place 1 spray into both nostrils daily.    [provider]  furosemide (LASIX) 40 MG tablet Take 1.5 tablets (60 mg total) by mouth 2 (two) times daily. 02/23/20 05/23/20  [name], MD  guaiFENesin 200 MG tablet Take 400 mg by mouth every 4 (four) hours as needed for cough or to loosen phlegm.    [provider]  levalbuterol Penne Lash HFA) 45 MCG/ACT inhaler Inhale 1-2 puffs into the lungs every 6 (six) hours as needed for wheezing or shortness of breath. 08/31/14   Ria Bush, MD  Loratadine 10 MG CAPS Take 1 capsule by mouth daily as needed (allergies).    [provider]  montelukast (SINGULAIR) 10 MG tablet Take 10 mg by mouth daily as  needed (BREATHING).     [provider]  polyethylene glycol (MIRALAX / GLYCOLAX) packet Take 17 g by mouth daily as needed (constipation).     [provider]  predniSONE (DELTASONE) 5 MG tablet Take 1 tablet (5 mg total) by mouth 2 (two) times daily with a meal. 05/20/20   Olalere, Adewale A, MD  tamsulosin (FLOMAX) 0.4 MG CAPS capsule TAKE 1 CAPSULE BY MOUTH EVERY DAY 05/20/20   Billey Co, MD  vitamin B-12 (CYANOCOBALAMIN) 500 MCG tablet Take 2 tablets (1,000 mcg total) by mouth daily. 11/16/19   Ria Bush, MD  warfarin (COUMADIN) 5 MG tablet TAKE 1/2 TO 1 TABLET BY MOUTH DAILY AS DIRECTED BY COUMADIN CLINIC 12/30/19   Evans Lance, MD   Physical Exam: Vitals:   06/17/20 2000 06/17/20 2038 06/17/20 2259 06/17/20 2338  BP: 111/65 122/77  92/61  Pulse: (!) 59 67  60  Resp: (!) 22 17  16   Temp:  98 F (36.7 C)  98.1 F (36.7 C)  TempSrc:  Oral    SpO2: 100% 100%  98%  Weight:   66.8 kg   Height:   5\' 4"  (1.626 m)    Constitutional: appears age-appropriate, NAD, calm, comfortable Eyes: PERRL, lids and conjunctivae normal ENMT: Mucous membranes are moist. Posterior pharynx clear of any exudate or lesions.  Age-appropriate dentition. Hearing appropriate Neck: normal, supple, no masses, no thyromegaly Respiratory: clear to auscultation bilaterally, no wheezing, no crackles. Normal respiratory effort. No accessory muscle use.  Cardiovascular: Regular rate and rhythm, no murmurs / rubs / gallops.  2+ bilateral pitting edema. 2+ pedal pulses. No carotid bruits.  Abdomen: no tenderness, no masses palpated, no hepatosplenomegaly. Bowel sounds positive.  Musculoskeletal: no clubbing / cyanosis. No joint deformity upper and lower extremities. Good ROM, no contractures, no atrophy. Normal muscle tone.  Skin: no rashes, lesions, ulcers. No induration Neurologic: Sensation intact. Strength 5/5 in all 4.  Psychiatric: Normal judgment and insight. Alert and oriented x 3. Normal mood.   EKG: independently reviewed, showing sinus rhythm with rate of 60 and QTc 502  Chest x-ray on Admission: I personally reviewed and I agree with radiologist reading as below.  CT ABDOMEN PELVIS WO CONTRAST  Result Date: 06/17/2020 CLINICAL DATA:  Bloated the generalized abdominal pain. EXAM: CT ABDOMEN AND PELVIS WITHOUT CONTRAST TECHNIQUE: Multidetector CT imaging of the abdomen and pelvis was performed following the standard protocol without IV contrast. COMPARISON:  June 14, 2020 FINDINGS: Lower chest: There is moderate severity cardiomegaly with a small pericardial effusion. Mild atelectasis and/or infiltrate is seen within the bilateral lung bases, right greater than left. Small to moderate sized bilateral pleural effusions are noted. Hepatobiliary: No focal liver abnormality is seen. Status post cholecystectomy. No biliary dilatation. Pancreas: Unremarkable. No pancreatic ductal dilatation or surrounding inflammatory changes. Spleen: Normal in size without focal abnormality. Adrenals/Urinary Tract: Adrenal glands are unremarkable. Kidneys are normal, without renal calculi, focal lesion, or hydronephrosis. Mildly diluted contrast  is seen within the lumen of an otherwise normal appearing urinary bladder. Stomach/Bowel: There is a small hiatal hernia. Appendix appears normal. No evidence of bowel dilatation. A large amount of stool is seen throughout the colon. Numerous noninflamed diverticula are seen throughout the large bowel. The mildly inflamed diverticulum seen within the sigmoid colon the prior study is no longer visualized. Vascular/Lymphatic: Aortic atherosclerosis. 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 No enlarged abdominal or pelvic lymph nodes. Reproductive: Prostate is unremarkable. A penile  pump and associated reservoir are seen. Other: A 3.9 cm x 2.8 cm left inguinal hernia containing fluid and fat is seen. No abdominopelvic ascites. Musculoskeletal: Approximately 3 mm retrolisthesis of the L2 vertebral body is noted on L3. Multilevel degenerative changes seen throughout the lumbar spine. IMPRESSION: 1. Small to moderate sized bilateral pleural effusions, right greater than left. 2. Mild bibasilar atelectasis and/or infiltrate, right greater than left. 3. Moderate severity cardiomegaly with a small pericardial effusion. 4. Colonic diverticulosis. 5. Large stool burden without evidence of bowel obstruction. 6. Small hiatal hernia. 7. Approximately 3 mm retrolisthesis of the L2 vertebral body on L3 with multilevel degenerative changes throughout the lumbar spine. 8. Aortic atherosclerosis. Aortic Atherosclerosis (ICD10-I70.0). Electronically Signed   By: Virgina Norfolk M.D.   On: 06/17/2020 16:03   DG Chest 2 View  Result Date: 06/17/2020 CLINICAL DATA:  Dyspnea.  Extremity swelling. EXAM: CHEST - 2 VIEW COMPARISON:  Most recent CT 11/04/2017. Lung bases from abdominal CT earlier today reviewed. FINDINGS: Multi lead left-sided pacemaker in place. The heart is enlarged. Stable mediastinal contours. Small bilateral pleural effusions. Mild vascular congestion without pulmonary edema. No confluent  airspace disease. No pneumothorax. Right shoulder arthroplasty, alignment not well assessed on the current exam. There is a E minimally displaced distal right clavicle fracture, age indeterminate but new from 2019. Chronic superior subluxation of the humeral head. IMPRESSION: 1. Cardiomegaly with vascular congestion and small bilateral pleural effusions. Findings consistent with mild CHF. 2. Minimally displaced distal right clavicle fracture is age indeterminate but new from 2019. Recommend correlation with history of interval injury and focal pain. Electronically Signed   By: Keith Rake M.D.   On: 06/17/2020 16:06   Labs on Admission: I have personally reviewed following labs  CBC: Recent Labs  Lab 06/14/20 0946 06/17/20 1555  WBC 10.5 9.1  NEUTROABS  --  7.8*  HGB 12.9* 13.4  HCT 39.0 39.2  MCV 90.3 89.3  PLT 166 371   Basic Metabolic Panel: Recent Labs  Lab 06/14/20 0946 06/17/20 1555  NA 133* 124*  K 4.9 5.6*  CL 95* 90*  CO2 28 26  GLUCOSE 116* 124*  BUN 40* 39*  CREATININE 1.76* 1.60*  CALCIUM 9.0 8.2*   GFR: Estimated Creatinine Clearance: 27.8 mL/min (A) (by C-G formula based on SCr of 1.6 mg/dL (H)).  Liver Function Tests: Recent Labs  Lab 06/14/20 0946 06/17/20 1555  AST 28 33  ALT 21 24  ALKPHOS 94 97  BILITOT 1.8* 1.5*  PROT 6.3* 6.1*  ALBUMIN 3.6 3.6   Recent Labs  Lab 06/14/20 0946 06/17/20 1555  LIPASE 33 47   Coagulation Profile: Recent Labs  Lab 06/14/20 0946 06/15/20 1001 06/17/20 2005  INR 3.9* 4.8* 1.9*   Urine analysis:    Component Value Date/Time   COLORURINE ORANGE (A) 06/17/2020 1646   APPEARANCEUR HAZY (A) 06/17/2020 1646   APPEARANCEUR Clear 07/06/2019 1046   LABSPEC 1.015 06/17/2020 1646   LABSPEC 1.010 08/20/2013 1220   PHURINE  06/17/2020 1646    TEST NOT REPORTED DUE TO COLOR INTERFERENCE OF URINE PIGMENT   GLUCOSEU (A) 06/17/2020 1646    TEST NOT REPORTED DUE TO COLOR INTERFERENCE OF URINE PIGMENT   GLUCOSEU  Negative 08/20/2013 1220   HGBUR (A) 06/17/2020 1646    TEST NOT REPORTED DUE TO COLOR INTERFERENCE OF URINE PIGMENT   BILIRUBINUR (A) 06/17/2020 1646    TEST NOT REPORTED DUE TO COLOR INTERFERENCE OF URINE PIGMENT   BILIRUBINUR Negative 07/06/2019  1046   BILIRUBINUR Negative 08/20/2013 1220   KETONESUR (A) 06/17/2020 1646    TEST NOT REPORTED DUE TO COLOR INTERFERENCE OF URINE PIGMENT   PROTEINUR (A) 06/17/2020 1646    TEST NOT REPORTED DUE TO COLOR INTERFERENCE OF URINE PIGMENT   UROBILINOGEN 0.2 08/17/2013 1019   NITRITE (A) 06/17/2020 1646    TEST NOT REPORTED DUE TO COLOR INTERFERENCE OF URINE PIGMENT   LEUKOCYTESUR (A) 06/17/2020 1646    TEST NOT REPORTED DUE TO COLOR INTERFERENCE OF URINE PIGMENT   LEUKOCYTESUR Negative 08/20/2013 1220   Guliana Weyandt N Dionis Autry D.O. [facility]  If 7PM-7AM, please contact overnight-coverage provider If 7AM-7PM, please contact day coverage provider [email or web address]  06/18/2020, 12:18 AM

## 2020-06-17 NOTE — ED Triage Notes (Signed)
Pt c/o dry heave, shob with movement, gain 8lbs (146this morning, normally around 138), pitting edema RLE>LLE. Bruising noted to Rupper arm and chest d/t fall 2/17 and on coumadin

## 2020-06-17 NOTE — Telephone Encounter (Signed)
I tried to call him back but he was in the hospital.

## 2020-06-18 ENCOUNTER — Observation Stay (HOSPITAL_COMMUNITY)
Admit: 2020-06-18 | Discharge: 2020-06-18 | Disposition: A | Discharge status: Home / Self Care | Payer: Medicare HMO | Attending: Physician Assistant | Admitting: Physician Assistant

## 2020-06-18 ENCOUNTER — Other Ambulatory Visit: Payer: Self-pay

## 2020-06-18 DIAGNOSIS — K5792 Diverticulitis of intestine, part unspecified, without perforation or abscess without bleeding: Secondary | ICD-10-CM | POA: Diagnosis present

## 2020-06-18 DIAGNOSIS — I351 Nonrheumatic aortic (valve) insufficiency: Secondary | ICD-10-CM | POA: Diagnosis present

## 2020-06-18 DIAGNOSIS — R0602 Shortness of breath: Secondary | ICD-10-CM | POA: Diagnosis present

## 2020-06-18 DIAGNOSIS — M109 Gout, unspecified: Secondary | ICD-10-CM | POA: Diagnosis not present

## 2020-06-18 DIAGNOSIS — W19XXXA Unspecified fall, initial encounter: Secondary | ICD-10-CM | POA: Diagnosis not present

## 2020-06-18 DIAGNOSIS — G4733 Obstructive sleep apnea (adult) (pediatric): Secondary | ICD-10-CM | POA: Diagnosis present

## 2020-06-18 DIAGNOSIS — I447 Left bundle-branch block, unspecified: Secondary | ICD-10-CM | POA: Diagnosis present

## 2020-06-18 DIAGNOSIS — R062 Wheezing: Secondary | ICD-10-CM | POA: Diagnosis not present

## 2020-06-18 DIAGNOSIS — Z20822 Contact with and (suspected) exposure to covid-19: Secondary | ICD-10-CM | POA: Diagnosis not present

## 2020-06-18 DIAGNOSIS — I272 Pulmonary hypertension, unspecified: Secondary | ICD-10-CM

## 2020-06-18 DIAGNOSIS — I5023 Acute on chronic systolic (congestive) heart failure: Secondary | ICD-10-CM | POA: Diagnosis not present

## 2020-06-18 DIAGNOSIS — Y92009 Unspecified place in unspecified non-institutional (private) residence as the place of occurrence of the external cause: Secondary | ICD-10-CM | POA: Diagnosis not present

## 2020-06-18 DIAGNOSIS — I482 Chronic atrial fibrillation, unspecified: Secondary | ICD-10-CM | POA: Diagnosis not present

## 2020-06-18 DIAGNOSIS — E8770 Fluid overload, unspecified: Secondary | ICD-10-CM

## 2020-06-18 DIAGNOSIS — K219 Gastro-esophageal reflux disease without esophagitis: Secondary | ICD-10-CM | POA: Diagnosis present

## 2020-06-18 DIAGNOSIS — I442 Atrioventricular block, complete: Secondary | ICD-10-CM | POA: Diagnosis present

## 2020-06-18 DIAGNOSIS — I4821 Permanent atrial fibrillation: Secondary | ICD-10-CM | POA: Diagnosis present

## 2020-06-18 DIAGNOSIS — I5021 Acute systolic (congestive) heart failure: Secondary | ICD-10-CM

## 2020-06-18 DIAGNOSIS — I483 Typical atrial flutter: Secondary | ICD-10-CM | POA: Diagnosis not present

## 2020-06-18 DIAGNOSIS — Z95810 Presence of automatic (implantable) cardiac defibrillator: Secondary | ICD-10-CM | POA: Diagnosis not present

## 2020-06-18 DIAGNOSIS — Z8249 Family history of ischemic heart disease and other diseases of the circulatory system: Secondary | ICD-10-CM | POA: Diagnosis not present

## 2020-06-18 DIAGNOSIS — I5022 Chronic systolic (congestive) heart failure: Secondary | ICD-10-CM | POA: Diagnosis not present

## 2020-06-18 DIAGNOSIS — E871 Hypo-osmolality and hyponatremia: Secondary | ICD-10-CM | POA: Diagnosis not present

## 2020-06-18 DIAGNOSIS — R69 Illness, unspecified: Secondary | ICD-10-CM | POA: Diagnosis not present

## 2020-06-18 DIAGNOSIS — I708 Atherosclerosis of other arteries: Secondary | ICD-10-CM | POA: Diagnosis not present

## 2020-06-18 DIAGNOSIS — I4892 Unspecified atrial flutter: Secondary | ICD-10-CM | POA: Diagnosis not present

## 2020-06-18 DIAGNOSIS — N400 Enlarged prostate without lower urinary tract symptoms: Secondary | ICD-10-CM | POA: Diagnosis present

## 2020-06-18 DIAGNOSIS — Z79899 Other long term (current) drug therapy: Secondary | ICD-10-CM | POA: Diagnosis not present

## 2020-06-18 DIAGNOSIS — I5043 Acute on chronic combined systolic (congestive) and diastolic (congestive) heart failure: Secondary | ICD-10-CM

## 2020-06-18 DIAGNOSIS — R0600 Dyspnea, unspecified: Secondary | ICD-10-CM | POA: Diagnosis not present

## 2020-06-18 DIAGNOSIS — F419 Anxiety disorder, unspecified: Secondary | ICD-10-CM | POA: Diagnosis not present

## 2020-06-18 DIAGNOSIS — I428 Other cardiomyopathies: Secondary | ICD-10-CM | POA: Diagnosis not present

## 2020-06-18 DIAGNOSIS — Z9049 Acquired absence of other specified parts of digestive tract: Secondary | ICD-10-CM | POA: Diagnosis not present

## 2020-06-18 DIAGNOSIS — I420 Dilated cardiomyopathy: Secondary | ICD-10-CM | POA: Diagnosis not present

## 2020-06-18 DIAGNOSIS — M10271 Drug-induced gout, right ankle and foot: Secondary | ICD-10-CM | POA: Diagnosis not present

## 2020-06-18 DIAGNOSIS — N179 Acute kidney failure, unspecified: Secondary | ICD-10-CM | POA: Diagnosis not present

## 2020-06-18 DIAGNOSIS — R52 Pain, unspecified: Secondary | ICD-10-CM | POA: Diagnosis not present

## 2020-06-18 DIAGNOSIS — E878 Other disorders of electrolyte and fluid balance, not elsewhere classified: Secondary | ICD-10-CM | POA: Diagnosis present

## 2020-06-18 DIAGNOSIS — I130 Hypertensive heart and chronic kidney disease with heart failure and stage 1 through stage 4 chronic kidney disease, or unspecified chronic kidney disease: Secondary | ICD-10-CM | POA: Diagnosis not present

## 2020-06-18 DIAGNOSIS — Z66 Do not resuscitate: Secondary | ICD-10-CM | POA: Diagnosis not present

## 2020-06-18 DIAGNOSIS — M7989 Other specified soft tissue disorders: Secondary | ICD-10-CM | POA: Diagnosis not present

## 2020-06-18 DIAGNOSIS — M19071 Primary osteoarthritis, right ankle and foot: Secondary | ICD-10-CM | POA: Diagnosis not present

## 2020-06-18 LAB — SARS CORONAVIRUS 2 (TAT 6-24 HRS): SARS Coronavirus 2: NEGATIVE

## 2020-06-18 LAB — PROTIME-INR
INR: 1.7 — ABNORMAL HIGH (ref 0.8–1.2)
Prothrombin Time: 19.3 seconds — ABNORMAL HIGH (ref 11.4–15.2)

## 2020-06-18 LAB — BASIC METABOLIC PANEL
Anion gap: 8 (ref 5–15)
BUN: 40 mg/dL — ABNORMAL HIGH (ref 8–23)
CO2: 27 mmol/L (ref 22–32)
Calcium: 8.3 mg/dL — ABNORMAL LOW (ref 8.9–10.3)
Chloride: 95 mmol/L — ABNORMAL LOW (ref 98–111)
Creatinine, Ser: 1.57 mg/dL — ABNORMAL HIGH (ref 0.61–1.24)
GFR, Estimated: 43 mL/min — ABNORMAL LOW (ref >60)
Glucose, Bld: 85 mg/dL (ref 70–99)
Potassium: 4.2 mmol/L (ref 3.5–5.1)
Sodium: 130 mmol/L — ABNORMAL LOW (ref 135–145)

## 2020-06-18 LAB — ECHOCARDIOGRAM COMPLETE
AR max vel: 0.96 cm2
AV Peak grad: 6.2 mmHg
Ao pk vel: 1.24 m/s
Calc EF: 23.9 %
Height: 64 in
S' Lateral: 5.56 cm
Single Plane A2C EF: 24.6 %
Single Plane A4C EF: 27.6 %
Weight: 2355.2 oz

## 2020-06-18 LAB — CBC
HCT: 36.6 % — ABNORMAL LOW (ref 39.0–52.0)
Hemoglobin: 12.6 g/dL — ABNORMAL LOW (ref 13.0–17.0)
MCH: 30.4 pg (ref 26.0–34.0)
MCHC: 34.4 g/dL (ref 30.0–36.0)
MCV: 88.4 fL (ref 80.0–100.0)
Platelets: 142 10*3/uL — ABNORMAL LOW (ref 150–400)
RBC: 4.14 MIL/uL — ABNORMAL LOW (ref 4.22–5.81)
RDW: 16.5 % — ABNORMAL HIGH (ref 11.5–15.5)
WBC: 7.8 10*3/uL (ref 4.0–10.5)
nRBC: 0 % (ref 0.0–0.2)

## 2020-06-18 MED ORDER — FLUTICASONE PROPIONATE 50 MCG/ACT NA SUSP
1.0000 | Freq: Every day | NASAL | Status: DC
  Administered 2020-06-18 – 2020-06-23 (×6): 1 via NASAL
  Filled 2020-06-18: qty 16

## 2020-06-18 MED ORDER — VITAMIN B-12 1000 MCG PO TABS
1000.0000 ug | ORAL_TABLET | Freq: Every day | ORAL | Status: DC
  Administered 2020-06-18 – 2020-06-23 (×6): 1000 ug via ORAL
  Filled 2020-06-18 (×6): qty 1

## 2020-06-18 MED ORDER — ALPRAZOLAM 0.25 MG PO TABS: 0.2500 mg | ORAL_TABLET | Freq: Every evening | ORAL | Status: DC | PRN

## 2020-06-18 MED ORDER — TAMSULOSIN HCL 0.4 MG PO CAPS
0.4000 mg | ORAL_CAPSULE | Freq: Every day | ORAL | Status: DC
  Administered 2020-06-18 – 2020-06-22 (×5): 0.4 mg via ORAL
  Filled 2020-06-18 (×5): qty 1

## 2020-06-18 MED ORDER — POLYETHYLENE GLYCOL 3350 17 G PO PACK
17.0000 g | PACK | Freq: Every day | ORAL | Status: DC | PRN
  Administered 2020-06-21 – 2020-06-23 (×3): 17 g via ORAL
  Filled 2020-06-18 (×3): qty 1

## 2020-06-18 MED ORDER — WARFARIN SODIUM 7.5 MG PO TABS
7.5000 mg | ORAL_TABLET | Freq: Once | ORAL | Status: AC
  Administered 2020-06-18: 7.5 mg via ORAL
  Filled 2020-06-18: qty 1

## 2020-06-18 MED ORDER — CARVEDILOL 6.25 MG PO TABS
9.3750 mg | ORAL_TABLET | Freq: Two times a day (BID) | ORAL | Status: DC
  Administered 2020-06-18 – 2020-06-23 (×10): 9.375 mg via ORAL
  Filled 2020-06-18 (×11): qty 1

## 2020-06-18 MED ORDER — MONTELUKAST SODIUM 10 MG PO TABS
10.0000 mg | ORAL_TABLET | Freq: Every day | ORAL | Status: DC
  Administered 2020-06-18 – 2020-06-22 (×5): 10 mg via ORAL
  Filled 2020-06-18 (×5): qty 1

## 2020-06-18 NOTE — Progress Notes (Signed)
[place] [facility] [place] NAME: [name]    MR#:  [id number]  DATE OF BIRTH:  [birth year]  SUBJECTIVE:  came in with increasing shortness of breath for two weeks. Had recent falls at home with right shoulder ecchymosis. Denies chest pain. Denies abdominal pain. Wife at bedside  REVIEW OF SYSTEMS:   Review of Systems  Constitutional: Negative for chills, fever and weight loss.  HENT: Negative for ear discharge, ear pain and nosebleeds.   Eyes: Negative for blurred vision, pain and discharge.  Respiratory: Positive for shortness of breath. Negative for sputum production, wheezing and stridor.   Cardiovascular: Negative for chest pain, palpitations, orthopnea and PND.  Gastrointestinal: Negative for abdominal pain, diarrhea, nausea and vomiting.  Genitourinary: Negative for frequency and urgency.  Musculoskeletal: Negative for back pain and joint pain.  Neurological: Positive for weakness. Negative for sensory change, speech change and focal weakness.  Psychiatric/Behavioral: Negative for depression and hallucinations. The patient is not nervous/anxious.    Tolerating Diet:yes Tolerating PT:   VITALS:  Blood pressure (!) 100/54, pulse 69, temperature 97.8 F (36.6 C), temperature source Axillary, resp. rate 18, height 5\' 4"  (1.626 m), weight 66.8 kg, SpO2 96 %.  PHYSICAL EXAMINATION:   Physical Exam  GENERAL:  87 y.o.-year-old patient lying in the bed with no acute distress.  LUNGS: decreasedbreath sounds bilaterally, no wheezing, rales, rhonchi. No use of accessory muscles of respiration.  CARDIOVASCULAR: S1, S2 normal. No murmurs, rubs, or gallops.  ABDOMEN: Soft, nontender, nondistended. Bowel sounds present. No organomegaly or mass.  EXTREMITIES: No cyanosis, clubbing  ++edema b/l.    NEUROLOGIC: Cranial nerves II through XII are intact. No focal Motor or sensory deficits b/l.   PSYCHIATRIC:  patient is alert and oriented x 3.   SKIN: No obvious rash, lesion, or ulcer.   LABORATORY PANEL:  CBC Recent Labs  Lab 06/18/20 0542  WBC 7.8  HGB 12.6*  HCT 36.6*  PLT 142*    Chemistries  Recent Labs  Lab 06/17/20 1555 06/18/20 0542  NA 124* 130*  K 5.6* 4.2  CL 90* 95*  CO2 26 27  GLUCOSE 124* 85  BUN 39* 40*  CREATININE 1.60* 1.57*  CALCIUM 8.2* 8.3*  AST 33  --   ALT 24  --   ALKPHOS 97  --   BILITOT 1.5*  --    Cardiac Enzymes No results for input(s): TROPONINI in the last 168 hours. RADIOLOGY:  CT ABDOMEN PELVIS WO CONTRAST  Result Date: 06/17/2020 CLINICAL DATA:  Bloated the generalized abdominal pain. EXAM: CT ABDOMEN AND PELVIS WITHOUT CONTRAST TECHNIQUE: Multidetector CT imaging of the abdomen and pelvis was performed following the standard protocol without IV contrast. COMPARISON:  June 14, 2020 FINDINGS: Lower chest: There is moderate severity cardiomegaly with a small pericardial effusion. Mild atelectasis and/or infiltrate is seen within the bilateral lung bases, right greater than left. Small to moderate sized bilateral pleural effusions are noted. Hepatobiliary: No focal liver abnormality is seen. Status post cholecystectomy. No biliary dilatation. Pancreas: Unremarkable. No pancreatic ductal dilatation or surrounding inflammatory changes. Spleen: Normal in size without focal abnormality. Adrenals/Urinary Tract: Adrenal glands are unremarkable. Kidneys are normal, without renal calculi, focal lesion, or hydronephrosis. Mildly diluted contrast is seen within the lumen of an otherwise normal appearing urinary bladder. Stomach/Bowel: There is a small hiatal hernia. Appendix appears normal. No evidence of bowel dilatation. A large amount of stool is seen throughout the colon. Numerous noninflamed diverticula are  seen throughout the large bowel. The mildly inflamed diverticulum seen within the sigmoid colon the prior study is no longer visualized. Vascular/Lymphatic: Aortic atherosclerosis. Dilatation  of the celiac artery is again seen (approximately 15 mm). No enlarged abdominal or pelvic lymph nodes. Reproductive: Prostate is unremarkable. A penile pump and associated reservoir are seen. Other: A 3.9 cm x 2.8 cm left inguinal hernia containing fluid and fat is seen. No abdominopelvic ascites. Musculoskeletal: Approximately 3 mm retrolisthesis of the L2 vertebral body is noted on L3. Multilevel degenerative changes seen throughout the lumbar spine. IMPRESSION: 1. Small to moderate sized bilateral pleural effusions, right greater than left. 2. Mild bibasilar atelectasis and/or infiltrate, right greater than left. 3. Moderate severity cardiomegaly with a small pericardial effusion. 4. Colonic diverticulosis. 5. Large stool burden without evidence of bowel obstruction. 6. Small hiatal hernia. 7. Approximately 3 mm retrolisthesis of the L2 vertebral body on L3 with multilevel degenerative changes throughout the lumbar spine. 8. Aortic atherosclerosis. Aortic Atherosclerosis (ICD10-I70.0). Electronically Signed   By: Virgina Norfolk M.D.   On: 06/17/2020 16:03   DG Chest 2 View  Result Date: 06/17/2020 CLINICAL DATA:  Dyspnea.  Extremity swelling. EXAM: CHEST - 2 VIEW COMPARISON:  Most recent CT 11/04/2017. Lung bases from abdominal CT earlier today reviewed. FINDINGS: Multi lead left-sided pacemaker in place. The heart is enlarged. Stable mediastinal contours. Small bilateral pleural effusions. Mild vascular congestion without pulmonary edema. No confluent airspace disease. No pneumothorax. Right shoulder arthroplasty, alignment not well assessed on the current exam. There is a E minimally displaced distal right clavicle fracture, age indeterminate but new from 2019. Chronic superior subluxation of the humeral head. IMPRESSION: 1. Cardiomegaly with vascular congestion and small bilateral pleural effusions. Findings consistent with mild CHF. 2. Minimally displaced distal right clavicle fracture is age  indeterminate but new from 2019. Recommend correlation with history of interval injury and focal pain. Electronically Signed   By: Keith Rake M.D.   On: 06/17/2020 16:06   ASSESSMENT AND PLAN:  [name] is a 87 y.o. male with medical history significant for hypertension, anxiety, atrial flutter, aortic valve regurgitation, BPH, chronic combined heart failure, left bundle branch block, pulmonary hypertension, presented to the emergency department for chief concerns of shortness of breath. He reports worsening shortness of breath over 1 week from baseline.  He endorses that he has gained 5-6 pounds in one week  Acute on chronic systolic heart failure (EF 20-25%, 2019) Severe pulmonary hypertension Nonischemic cardiomyopathy s/p AICD --pt c/o progressive symptoms over the last week and since he had constipation went without his Lasix for 2 days.  -- cont IV lasix, I/o, daily weight --oxygen as needed --appreciated [facility] Cardiology input -- given sever Pulm HTN recommends f/u with out pt CHF/pilm clinic  --Echo on 04/22/2017: EF 20 to 25%, pulmonary hypertension, grade 2 diastolic dysfunction  Hyponatremia due to volume overload from CHF --124---130 --impoving with diuresis  Atrial flutter Chronic anticoagulation --Asymptomatic.   -- telemetry consistent with atrial flutter, which is likely exacerbating his volume overload as above.  S/p BiV ICD as above.  Failed Tikosyn therapy in the past -cont coreg --cont warfarin --manage per Pharmacy  HTN --Current BP well controlled/soft.  Diverticulitis --Recent ED visit for diverticulitis.  --pt has not been taking his augmentin as prescribed and given lot of GI issues he does not want to cont with it. Denies any abd pain. --Tolerating po diet    Anxiety--prn xanax  Right shoulder ecchymosis s/p mech fall  at home No fracture  BPH--cont flomax  Family communication :wife at bedside Consults :cardiology CODE STATUS:  DNR (PTA) DVT Prophylaxis :warfarin Level of care: Progressive Cardiac Status is: Inpatient  Remains inpatient appropriate because:Inpatient level of care appropriate due to severity of illness   Dispo: The patient is from: Home              Anticipated d/c is to: Home              Patient currently is not medically stable to d/c.   Difficult to place patient No  CHF--cont IV diuresis      TOTAL TIME TAKING CARE OF THIS PATIENT: 25 minutes.  >50% time spent on counselling and coordination of care  Note: This dictation was prepared with Dragon dictation along with smaller phrase technology. Any transcriptional errors that result from this process are unintentional.  Fritzi Mandes M.D    [facility]   CC: Primary care physician; Ria Bush, MDPatient ID: Sherryle Lis, male   DOB: 11/10/1931, 87 y.o.   MRN: [id number]

## 2020-06-18 NOTE — Progress Notes (Signed)
cpap declined.  Pt prefers to have his home unit brought in.

## 2020-06-18 NOTE — Progress Notes (Signed)
ANTICOAGULATION CONSULT NOTE  Pharmacy Consult for Warfarin Indication: atrial fibrillation    Patient Measurements: Height: 5\' 4"  (162.6 cm) Weight: 66.8 kg (147 lb 3.2 oz) IBW/kg (Calculated) : 59.2  Vital Signs: Temp: 98.7 F (37.1 C) (03/12 0744) Temp Source: Oral (03/12 0744) BP: 100/72 (03/12 0744) Pulse Rate: 68 (03/12 0744)  Labs: Recent Labs    06/15/20 1001 06/17/20 1555 06/17/20 2005 06/18/20 0542  HGB  --  13.4  --  12.6*  HCT  --  39.2  --  36.6*  PLT  --  154  --  142*  LABPROT  --   --  20.8* 19.3*  INR 4.8*  --  1.9* 1.7*  CREATININE  --  1.60*  --  1.57*  TROPONINIHS  --  3  --   --     Estimated Creatinine Clearance: 28.3 mL/min (A) (by C-G formula based on SCr of 1.57 mg/dL (H)).   Medical History: Past Medical History:  Diagnosis Date  . AICD (automatic cardioverter/defibrillator) present    s/p gen change 04/2015 w/ MDT [name] Crt-D  DTBA1D1, Serial number [id number]  . Allergic rhinitis    [name]  . Anemia   . Anxiety   . Arthritis   . Atrial flutter ([place])    A.  Status post cardioversion; B.  Tikosyn therapy - failed, remains in aflutter  . Bilateral pneumonia 11/02/2013   treated with levaquin  . BPH (benign prostatic hyperplasia)   . 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 Lucky Cowboy)  . Chronic lung disease    spirometry 2015 - no obstruction, + mild restrictive lung disease  . Chronic sinusitis   . Chronic systolic congestive heart failure ([place])   . Dyspnea    with exertion  . Extrinsic asthma    [name]  . Fall with injury 05/28/2017  . GERD (gastroesophageal reflux disease) 2010   h/o esophageal stricture with dilation, LA grade C reflux esophagitis by EGD 2010  . Goiter   . History of diverticulitis of colon 06/2020  . History of GI bleed    Secondary to hemorrhoids  . History of seasonal allergies   . Hypertension   . IBS (irritable bowel syndrome)   . LBBB (left bundle branch block)   . Multiple  pulmonary nodules 06/2013   RUL ([facility] Adam [facility] at [facility]) - ?vasculitis as of last CT at [facility]  . NICM (nonischemic cardiomyopathy) [facility])    Cardiac catheterization March 2006 without coronary disease; EF 25% 2018 Jan  . Orthopnea   . 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 Presence of permanent cardiac pacemaker   . Sinus congestion 09/25/2010    Medications:  Warfarin 2.5 mg on Mondays; 5mg  on all other days (TWD = 32.5 mg) *pt instructed to take 2.5mg  daily from 3/11-3/18/22 while on amoxicillin Pt reports last dose on 06/13/20 - 2.5mg    Assessment: 87yo male with history of congestive heart failure, A flutter, recently seen and started treatment outpatient for diverticulitis. Pt's INR from most recent admission 06/14/20 was 3.9, then 4.8 on 06/15/20; he did not receive warfarin during that admission. Pharmacy has been consulted for warfarin dosing and monitoring. Pt reports his last dose of warfarin was on 2.5mg  on 06/13/20. H&H and plt WNL.   Date INR Dose 3/7 -- 2.5 mg (per patient)  3/8 3.9 Held  (per patient)  3/9 4.8 Held  (per patient)  3/10 -- Held  (per patient)  3/11 1.9 2.5 mg 3/12 1.7   Goal of Therapy:  INR 2-3 Monitor platelets by anticoagulation protocol: Yes   Plan:   INR trending down, likely in setting of warfarin being held for several days.  Give warfarin 7.5 mg which is 1.5x the home dose.  INR with morning labs.   Tawnya Crook, PharmD Clinical Pharmacist  06/18/2020 8:55 AM

## 2020-06-18 NOTE — Progress Notes (Signed)
Patient stated he does not wear a CPAP at home.  Patient on 2LPM, SPO2: 99%, HR: 59.  Patient appears to be resting comfortably.

## 2020-06-18 NOTE — Consult Note (Signed)
Cardiology Consultation:   Patient ID: [name] MRN: [id number]; DOB: Oct 14, 1931  Admit date: 06/17/2020 Date of Consult: 06/18/2020  PCP:  [name], [facility]  Cardiologist:  [name] Breeding, MD  Advanced Practice Provider:  No care team member to display Electrophysiologist:  None    Patient Profile:   [name] is a 87 y.o. male with a hx of atrial flutter s/p DCCV and failed Tikosyn therapy on Coumadin, NICM / HFrEF (EF 20 to 25%, 2019) with prior LBBB and CHB s/p Medtronic BiV ICD s/p generator change out 04/2015, PHTN, hypertension, celiac aneurysm, COPD, GERD, BPH, IBS, and who is being seen today for the evaluation of AOC HFrEF at the request of [name].  History of Present Illness:   [name] is an 87 year old male with PMH as above. He underwent biventricular ICD for chronic systolic heart failure, LBBB, CHB,  and is s/p ICD generator change out 2017. 04/2017 echo with EF 20 to 25%, akinesis of the lateral, inferolateral, inferior myocardium noted, G2 DD, moderate MR, mild MR, severe LAE, mild RAE, PASP 70 mmHg, and trivial pericardial effusion.  He was last seen by [name] 03/23/2020.  He reported feeling better at that time with less shortness of breath.  Recommendation was to continue on Lasix 60 mg twice daily.  On 05/2020, he reportedly had a fall and "going downhill since that time."  He did hit his head and sustained substantial bruising (eye, arms).  Head CT was performed and without acute findings.  On 06/14/2020, he presented to the [facility] ED after 2 days of constipation, during which time he did not take his Lasix.  A member of the family is a Marine scientist and thought that the patient may be dehydrated, given that he was not able to eat or drink much with his constipation. However, they were uncertain if it was dehydration or fluid. Subsequent CT scan showed acute uncomplicated diverticulitis.  He reported  mild abdominal tenderness without peritoneal features.  He was prescribed Augmentin home and recommendation was to follow-up closely with PCP.  After that time, he noticed progressive shortness of breath /dyspnea, abdominal distention and progressive lower extremity edema.    On 3/10, he called the office with report of a 4 pound weight gain.  He was again taking his fluid pill but still short of breath.  Weight increased from 139 to 141 pounds to 146 pounds.  He reported taking 40 mg Lasix 3 times daily.  He had already taken 60 mg of Lasix that morning with recommendation to take 80 mg at lunch and 60 mg at his 4 PM dose.  If his morning weight had not changed by the subsequent morning, he was advised to take 80 mg at that time.   On 3/11, he called the office again reported taking Lasix 80 mg in the morning for a weight of 146lbs (unchanged).  He reported a low-salt diet and good urine output.  He then presented to [facility] ED with report of dry heaving, DOE, and fatigue.  He had pitting edema with right lower extremity greater than that of the left.  His family reports today that he also initially had a swollen face and body; however, this has subsequently improved since his increased diuresis via the telephone calls.  He denied increased orthopnea with family reporting that he sleeps relatively flat with the head of his bed only mildly elevated at approximately a 20 to 30  degree angle at all times and uncertain if this is due to breathing. No PND. He denied CP, racing HR, or palpitations. He and family reported early satiety, weight gain, abdominal distention, shortness of breath, dyspnea, and confusion.  He also reported feeling tenderness of his abdomen that came and went.  He was not sleeping well at all per family, who had been staying with him. He reported medication compliance with the exception of the 2 days, during which time he was constipated and before presenting to the emergency department with  diverticulitis.  In the ED, initial vitals significant for BP 103/69, HR 60 bpm, RR23.  Labs showed sodium 124, potassium 5.6, creatinine 1.6, Ca 8.2, BNP 2366.8.  Past Medical History:  Diagnosis Date  . AICD (automatic cardioverter/defibrillator) present    s/p gen change 04/2015 w/ MDT [name] Crt-D  DTBA1D1, Serial number [id number]  . Allergic rhinitis    [name]  . Anemia   . Anxiety   . Arthritis   . Atrial flutter ([place])    A.  Status post cardioversion; B.  Tikosyn therapy - failed, remains in aflutter  . Bilateral pneumonia 11/02/2013   treated with levaquin  . BPH (benign prostatic hyperplasia)   . 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 Chronic lung disease    spirometry 2015 - no obstruction, + mild restrictive lung disease  . Chronic sinusitis   . Chronic systolic congestive heart failure ([place])   . Dyspnea    with exertion  . Extrinsic asthma    [name]  . Fall with injury 05/28/2017  . GERD (gastroesophageal reflux disease) 2010   h/o esophageal stricture with dilation, LA grade C reflux esophagitis by EGD 2010  . Goiter   . History of diverticulitis of colon 06/2020  . History of GI bleed    Secondary to hemorrhoids  . History of seasonal allergies   . Hypertension   . IBS (irritable bowel syndrome)   . LBBB (left bundle branch block)   . Multiple pulmonary nodules 06/2013   RUL ([facility]. Adam [name] at [facility]) - ?vasculitis as of last CT at [facility]  . NICM (nonischemic cardiomyopathy) [facility])    Cardiac catheterization March 2006 without coronary disease; EF 25% 2018 Jan  . Orthopnea   . Porphyria ([place])   . Presence of permanent cardiac pacemaker   . Sinus congestion 09/25/2010    Past Surgical History:  Procedure Laterality Date  . BACK SURGERY  1997   bulging disks  . BiV-AICD implant     Medtronic  . CARDIAC CATHETERIZATION  06/22/04   Severe, nonischemic cardiomyopathy,EF 25-30%  . CARDIOVERSION  02/22/09    AFlutter-([facility])  . CATARACT EXTRACTION W/PHACO Right 12/21/2019   Procedure: CATARACT EXTRACTION PHACO AND INTRAOCULAR LENS PLACEMENT (IOC) RIGHT 2.23  00:21.9;  Surgeon: Eulogio Bear, MD;  Location: [place];  Service: Ophthalmology;  Laterality: Right;  . CATARACT EXTRACTION W/PHACO Left 02/01/2020   Procedure: CATARACT EXTRACTION PHACO AND INTRAOCULAR LENS PLACEMENT (IOC) LEFT 3.67 00:39.5;  Surgeon: Eulogio Bear, MD;  Location: [place];  Service: Ophthalmology;  Laterality: Left;  . CHOLECYSTECTOMY    . COLONOSCOPY  10/99   Divertic, splenic, hepatic fleure only  . COLONOSCOPY  10/21/08   aborted-divertics, int hemms (Dr. Vira Agar)  . CORONARY ANGIOPLASTY    . EP IMPLANTABLE DEVICE N/A 04/25/2015   Procedure: BIV ICD Generator Changeout;  Surgeon: Evans Lance,  MD;  Location: [place] [facility];  Service: Cardiovascular;  Laterality: N/A;  . ESOPHAGEAL DILATION  02/18/98  . ESOPHAGOGASTRODUODENOSCOPY  01/1998   stricture, sliding HH, GERD  . ESOPHAGOGASTRODUODENOSCOPY  04/08/01   stricture, gastritis, HH, GERD-no dilation(Dr. Henrene Pastor)  . ESOPHAGOGASTRODUODENOSCOPY  12/22/04   stricture; gastritis; duodenitis, GERD  . ESOPHAGOGASTRODUODENOSCOPY  10/21/08   Reflux esophagitis; Erythem. Duod. (Dr. Vira Agar)  . ESOPHAGOGASTRODUODENOSCOPY  08/2013   erythema gastric fundus - minimal gastritis, HH ([place])  . ESOPHAGOGASTRODUODENOSCOPY  08/2016   dysphagia - mod schatzki rin, dilated Tiffany Kocher)  . ESOPHAGOGASTRODUODENOSCOPY (EGD) WITH PROPOFOL N/A 08/24/2016   mod schatzki ring dilated, duodenal deformity Vira Agar, Gavin Pound, MD)  . goiter removal    . HERNIA REPAIR    . HERNIA REPAIR  01/30/02   Dr. Hassell Done  . INSERT / REPLACE / REMOVE PACEMAKER  2007 and 2017  . JOINT REPLACEMENT Right    shoulder  . NOSE SURGERY  1971   sinus surgery  . PENILE PROSTHESIS IMPLANT  2007   [place]  . PFT  10/2013   FVC 61%, FEV1 63%, ratio 0.76  . Pulmonary eval  04/2002   ([facility])  Chronic congestive symptoms  . REVERSE SHOULDER ARTHROPLASTY Right 01/17/2016   Procedure: REVERSE SHOULDER ARTHROPLASTY;  Surgeon: Corky Mull, MD;  Location: [facility];  Service: Orthopedics;  Laterality: Right;  . REVERSE TOTAL SHOULDER ARTHROPLASTY Right 01/2016   Poggi  . US ECHOCARDIOGRAPHY  07/13/04   EF 25-30%, Mod LVH; LA severe dilation; Mild AR; IRTR  . US ECHOCARDIOGRAPHY  11/27/06   hypokinesis posterior wall, EF 35%; mild AR     Home Medications:  Prior to Admission medications   Medication Sig Start Date End Date Taking? Authorizing Provider  acetaminophen (TYLENOL) 500 MG tablet Take 250 mg by mouth daily as needed. Pain    Yes [provider]  ALPRAZolam Duanne Moron) 0.25 MG tablet TAKE 1 TABLET BY MOUTH EVERYDAY AT BEDTIME 05/23/20  Yes [name] Bush, MD  alum & mag hydroxide-simeth (MAALOX/MYLANTA) 200-200-20 MG/5ML suspension Take 15 mLs by mouth daily as needed for indigestion or heartburn.   Yes [provider]  amoxicillin-clavulanate (AUGMENTIN) 875-125 MG tablet Take 1 tablet by mouth every 8 (eight) hours for 7 days. 06/14/20 06/21/20 Yes Vladimir Crofts, MD  carvedilol (COREG) 6.25 MG tablet TAKE 1 AND 1/2 TABLETS BY MOUTH TWICE DAILY 04/04/20  Yes [name] Breeding, MD  fluticasone (FLONASE) 50 MCG/ACT nasal spray Place 1 spray into both nostrils daily.   Yes [provider]  furosemide (LASIX) 40 MG tablet Take 1.5 tablets (60 mg total) by mouth 2 (two) times daily. 02/23/20 05/23/20 Yes Hochrein, Jeneen Rinks, MD  montelukast (SINGULAIR) 10 MG tablet Take 10 mg by mouth at bedtime.   Yes [provider]  polyethylene glycol (MIRALAX / GLYCOLAX) packet Take 17 g by mouth daily as needed (constipation).    Yes [provider]  predniSONE (DELTASONE) 5 MG tablet Take 1 tablet (5 mg total) by mouth 2 (two) times daily with a meal. 05/20/20  Yes Olalere, Adewale A, MD  tamsulosin (FLOMAX) 0.4 MG CAPS capsule TAKE 1 CAPSULE BY MOUTH EVERY  DAY Patient taking differently: Take 0.4 mg by mouth at bedtime. 05/20/20  Yes Billey Co, MD  vitamin B-12 (CYANOCOBALAMIN) 500 MCG tablet Take 2 tablets (1,000 mcg total) by mouth daily. 11/16/19  Yes [name] Bush, MD  warfarin (COUMADIN) 5 MG tablet TAKE 1/2 TO 1 TABLET BY MOUTH DAILY AS DIRECTED BY COUMADIN  CLINIC 12/30/19  Yes Evans Lance, MD    Inpatient Medications: Scheduled Meds: . carvedilol  9.375 mg Oral BID  . fluticasone  1 spray Each Nare Daily  . furosemide  60 mg Intravenous Daily  . montelukast  10 mg Oral QHS  . ondansetron (ZOFRAN) IV  4 mg Intravenous STAT  . sodium chloride flush  3 mL Intravenous Q12H  . tamsulosin  0.4 mg Oral QHS  . vitamin B-12  1,000 mcg Oral Daily  . warfarin  7.5 mg Oral ONCE-1600  . Warfarin - Pharmacist Dosing Inpatient   Does not apply q1600   Continuous Infusions: . sodium chloride     PRN Meds: sodium chloride, acetaminophen, ALPRAZolam, ondansetron (ZOFRAN) IV, polyethylene glycol, sodium chloride flush  Allergies:    Social History:   Social History   Socioeconomic History  . Marital status: Married    Spouse name: Not on file  . Number of children: 3  . Years of education: Not on file  . Highest education level: Not on file  Occupational History  . Occupation: retired    Fish farm manager: RETIRED  Tobacco Use  . Smoking status: Never Smoker  . Smokeless tobacco: Never Used  Vaping Use  . Vaping Use: Never used  Substance and Sexual Activity  . Alcohol use: No  . Drug use: No  . Sexual activity: Yes  Other Topics Concern  . Not on file  Social History Narrative   Lives with wife   3 children-8 grandchildren   Still works on a farm.   Retired from [employer]   Had [employer]   Good friend with Dr. Jefm Bryant   Activity: No regular exercise   Diet: good water, fruits/vegetables daily   Social Determinants of Health   Financial Resource Strain: Not on file  Food Insecurity: Not on file   Transportation Needs: Not on file  Physical Activity: Not on file  Stress: Not on file  Social Connections: Not on file  Intimate Partner Violence: Not on file    Family History:    Family History  Problem Relation Age of Onset  . Coronary artery disease Father   . Hypertension Father   . Heart failure Mother   . Dysphagia Sister   . Breast cancer Other   . Ovarian cancer Other   . Uterine cancer Other   . Other Sister        stomach problems  . Other Sister        stomach problems  . Other Sister        stomach problems  . Alcohol abuse Maternal Uncle      ROS:  Please see the history of present illness.  Review of Systems  Constitutional: Positive for malaise/fatigue.  HENT: Positive for hearing loss.        Chronic - hard of hearing  Respiratory: Positive for shortness of breath.   Cardiovascular: Positive for leg swelling. Negative for chest pain, palpitations, orthopnea and PND.       No increased orthopnea reported, bed at home always at 30 degrees  Gastrointestinal: Positive for abdominal pain and constipation.       Dry heaving without emesis Diverticulitis diagnosis Constipation - ED visit  Musculoskeletal: Positive for falls.       Fall 05/2020  Neurological: Positive for weakness.  Psychiatric/Behavioral: The patient has insomnia.        Per family, not sleeping well. Confused.  All other systems reviewed and are negative.  All other ROS reviewed and negative.     Physical Exam/Data:   Vitals:   06/17/20 2259 06/17/20 2338 06/18/20 0416 06/18/20 0744  BP:  92/61 103/62 100/72  Pulse:  60 62 68  Resp:  16 16 16   Temp:  98.1 F (36.7 C) 97.6 F (36.4 C) 98.7 F (37.1 C)  TempSrc:   Oral Oral  SpO2:  98% 99% 93%  Weight: 66.8 kg     Height: 5\' 4"  (1.626 m)       Intake/Output Summary (Last 24 hours) at 06/18/2020 1035 Last data filed at 06/18/2020 0950 Gross per 24 hour  Intake 480 ml  Output 450 ml  Net 30 ml   Last 3 Weights 06/17/2020  06/17/2020 06/14/2020  Weight (lbs) 147 lb 3.2 oz 146 lb 142 lb  Weight (kg) 66.769 kg 66.225 kg 64.411 kg     Body mass index is 25.27 kg/m.  General: Elderly male, no acute distress, facial bruising and upper extremity bruising noted and reported by family to be present since his fall 05/2020. HEENT: normal.  Hard of hearing. Lymph: no adenopathy Neck: JVP ~11cm Vascular: No carotid bruits; radial pulses 2+ bilaterally   Cardiac:  normal S1, S2; RRR; 1/6 systolic murmur LLSB Lungs: Bilaterally reduced breath sounds, no wheezing, rhonchi or rales  Abd: Distended, firm  Ext: Pedal edema and bilateral lower extremity pitting edema (2+) extending up to the upper thigh Musculoskeletal:  No deformities,  Skin: warm and dry  Neuro:  CNs 2-12 intact, no focal abnormalities noted Psych:  Normal affect   EKG:  The EKG was personally reviewed and demonstrates: BiV paced, Aflutter, 60 bpm  Telemetry:  Telemetry was personally reviewed and demonstrates:  BiV paced, Aflutter, 80-100s, PVCs   Relevant CV Studies: 04/2017 Echo - Left ventricle: The cavity size was moderately dilated. Wall  thickness was normal. Systolic function was severely reduced. The  estimated ejection fraction was in the range of 20% to 25%.  Akinesis of the lateral, inferolateral, and inferior myocardium.  Features are consistent with a pseudonormal left ventricular  filling pattern, with concomitant abnormal relaxation and  increased filling pressure (grade 2 diastolic dysfunction).  - Aortic valve: There was trivial regurgitation.  - Mitral valve: Calcified annulus. There was moderate  regurgitation.  - Left atrium: The atrium was severely dilated.  - Right atrium: The atrium was mildly dilated.  - Pulmonary arteries: Systolic pressure was severely increased. PA  peak pressure: 70 mm Hg (S).  - Pericardium, extracardiac: A trivial pericardial effusion was  identified.    -------------------------------------------------------------------  Labs, prior tests, procedures, and surgery:  Echocardiography (January 2018).  The aortic valve showed mild  regurgitation. EF was 25% and PA pressure was 65 (systolic).   Laboratory Data:  High Sensitivity Troponin:   Recent Labs  Lab 06/17/20 1555  TROPONINIHS 3     Chemistry Recent Labs  Lab 06/14/20 0946 06/17/20 1555 06/18/20 0542  NA 133* 124* 130*  K 4.9 5.6* 4.2  CL 95* 90* 95*  CO2 28 26 27   GLUCOSE 116* 124* 85  BUN 40* 39* 40*  CREATININE 1.76* 1.60* 1.57*  CALCIUM 9.0 8.2* 8.3*  GFRNONAA 37* 42* 43*  ANIONGAP 10 8 8     Recent Labs  Lab 06/14/20 0946 06/17/20 1555  PROT 6.3* 6.1*  ALBUMIN 3.6 3.6  AST 28 33  ALT 21 24  ALKPHOS 94 97  BILITOT 1.8* 1.5*   Hematology Recent Labs  Lab 06/14/20 0946 06/17/20 1555  06/18/20 0542  WBC 10.5 9.1 7.8  RBC 4.32 4.39 4.14*  HGB 12.9* 13.4 12.6*  HCT 39.0 39.2 36.6*  MCV 90.3 89.3 88.4  MCH 29.9 30.5 30.4  MCHC 33.1 34.2 34.4  RDW 16.2* 16.6* 16.5*  PLT 166 154 142*   BNP Recent Labs  Lab 06/17/20 1555  BNP 2,366.8*    DDimer No results for input(s): DDIMER in the last 168 hours.   Radiology/Studies:  CT ABDOMEN PELVIS WO CONTRAST  Result Date: 06/17/2020 CLINICAL DATA:  Bloated the generalized abdominal pain. EXAM: CT ABDOMEN AND PELVIS WITHOUT CONTRAST TECHNIQUE: Multidetector CT imaging of the abdomen and pelvis was performed following the standard protocol without IV contrast. COMPARISON:  June 14, 2020 FINDINGS: Lower chest: There is moderate severity cardiomegaly with a small pericardial effusion. Mild atelectasis and/or infiltrate is seen within the bilateral lung bases, right greater than left. Small to moderate sized bilateral pleural effusions are noted. Hepatobiliary: No focal liver abnormality is seen. Status post cholecystectomy. No biliary dilatation. Pancreas: Unremarkable. No pancreatic ductal dilatation or  surrounding inflammatory changes. Spleen: Normal in size without focal abnormality. Adrenals/Urinary Tract: Adrenal glands are unremarkable. Kidneys are normal, without renal calculi, focal lesion, or hydronephrosis. Mildly diluted contrast is seen within the lumen of an otherwise normal appearing urinary bladder. Stomach/Bowel: There is a small hiatal hernia. Appendix appears normal. No evidence of bowel dilatation. A large amount of stool is seen throughout the colon. Numerous noninflamed diverticula are seen throughout the large bowel. The mildly inflamed diverticulum seen within the sigmoid colon the prior study is no longer visualized. Vascular/Lymphatic: Aortic atherosclerosis. 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 No enlarged abdominal or pelvic lymph nodes. Reproductive: Prostate is unremarkable. A penile pump and associated reservoir are seen. Other: A 3.9 cm x 2.8 cm left inguinal hernia containing fluid and fat is seen. No abdominopelvic ascites. Musculoskeletal: Approximately 3 mm retrolisthesis of the L2 vertebral body is noted on L3. Multilevel degenerative changes seen throughout the lumbar spine. IMPRESSION: 1. Small to moderate sized bilateral pleural effusions, right greater than left. 2. Mild bibasilar atelectasis and/or infiltrate, right greater than left. 3. Moderate severity cardiomegaly with a small pericardial effusion. 4. Colonic diverticulosis. 5. Large stool burden without evidence of bowel obstruction. 6. Small hiatal hernia. 7. Approximately 3 mm retrolisthesis of the L2 vertebral body on L3 with multilevel degenerative changes throughout the lumbar spine. 8. Aortic atherosclerosis. Aortic Atherosclerosis (ICD10-I70.0). Electronically Signed   By: Virgina Norfolk M.D.   On: 06/17/2020 16:03   DG Chest 2 View  Result Date: 06/17/2020 CLINICAL DATA:  Dyspnea.  Extremity swelling. EXAM: CHEST - 2 VIEW COMPARISON:  Most recent CT 11/04/2017. Lung bases  from abdominal CT earlier today reviewed. FINDINGS: Multi lead left-sided pacemaker in place. The heart is enlarged. Stable mediastinal contours. Small bilateral pleural effusions. Mild vascular congestion without pulmonary edema. No confluent airspace disease. No pneumothorax. Right shoulder arthroplasty, alignment not well assessed on the current exam. There is a E minimally displaced distal right clavicle fracture, age indeterminate but new from 2019. Chronic superior subluxation of the humeral head. IMPRESSION: 1. Cardiomegaly with vascular congestion and small bilateral pleural effusions. Findings consistent with mild CHF. 2. Minimally displaced distal right clavicle fracture is age indeterminate but new from 2019. Recommend correlation with history of interval injury and focal pain. Electronically Signed   By: Keith Rake M.D.   On: 06/17/2020 16:06   CT ABDOMEN PELVIS W CONTRAST  Result Date:  06/14/2020 CLINICAL DATA:  Abdominal distension. EXAM: CT ABDOMEN AND PELVIS WITH CONTRAST TECHNIQUE: Multidetector CT imaging of the abdomen and pelvis was performed using the standard protocol following bolus administration of intravenous contrast. CONTRAST:  81mL OMNIPAQUE IOHEXOL 300 MG/ML  SOLN COMPARISON:  07/06/2019, 10/17/2011, 10/14/2008 FINDINGS: Lower chest: Small bilateral pleural effusions, left greater than right. Cardiomegaly. Trace pericardial effusion. Coronary artery atherosclerosis. Hepatobiliary: Again noted is subtle contour irregularity of the liver, which could reflect underlying cirrhosis. No focal hepatic mass identified. Prior cholecystectomy. No biliary dilatation. Pancreas: Unremarkable. No pancreatic ductal dilatation or surrounding inflammatory changes. Spleen: Normal in size without focal abnormality. Adrenals/Urinary Tract: Unremarkable adrenal glands. Bilateral renal cortical thinning. 1.4 cm lower pole left renal cyst. Kidneys enhance symmetrically without solid lesion, stone, or  hydronephrosis. Ureters are nondilated. Urinary bladder appears unremarkable. Stomach/Bowel: Small sliding type hiatal hernia. Stomach otherwise normal in appearance. Fluid-filled, nondilated loops of small bowel. Numerous sigmoid diverticula with focally inflamed short segment of mid sigmoid colon with adjacent pericolonic fat stranding (series 2, images 49-51). Moderate-large volume of stool throughout the colon. Vascular/Lymphatic: Atherosclerosis throughout the aortoiliac axis. There is aneurysmal dilatation of the celiac trunk measuring 13 mm in diameter. Narrowing of the origin of the celiac artery by the overlying diaphragmatic crura resulting in J-shaped appearance (series 7, images 61-62). No aneurysm of the abdominal aorta. No abdominopelvic lymphadenopathy. Reproductive: Prostate gland unchanged. Penile prosthesis with reservoir in the right hemipelvis. Other: No free fluid. No abdominopelvic fluid collection. No pneumoperitoneum. Small fat containing left inguinal hernia. Musculoskeletal: Similar degenerative findings. No acute osseous abnormality. IMPRESSION: 1. Acute uncomplicated sigmoid diverticulitis. 2. Small bilateral pleural effusions, left greater than right. 3. Moderate-large volume of stool throughout the colon. 4. Small fat containing left inguinal hernia. 5. Subtle contour irregularity of the liver, which could reflect underlying cirrhosis. 6. Similar appearance of narrowing of the origin of the celiac artery by the overlying diaphragmatic crura resulting in post-stenotic dilatation of the celiac trunk measuring 13 mm in diameter. Appearance raises the possibility of median arcuate ligament syndrome. Mesenteric arterial Doppler study with inspiration-expiration could be performed if further evaluation is clinically warranted. This finding has been present on multiple previous studies dating back to 2010. 7. Cardiomegaly. 8. Aortic atherosclerosis (ICD10-I70.0). Electronically Signed   By:  Davina Poke D.O.   On: 06/14/2020 13:21     Assessment and Plan:   Acute on chronic systolic heart failure (EF 20-25%, 2019) Severe pulmonary hypertension Nonischemic cardiomyopathy s/p BiV Medtronic ICD --NYHA class IV symptoms.  He reports progressive symptoms over the last week and since he had constipation went without his Lasix for 2 days.  In addition, EKG at presentation consistent with atrial flutter, as well as telemetry, and this is likely exacerbating his current fluid status.  His family also reports that he is on chronic prednisone 10 mg, which is also exacerbating his fluid status.  He has attempted at home escalation of PTA diuresis via telephone calls as outlined in detail above with patient taking almost double his home dose before presenting to [facility].  Escalation of diuresis did not change his home weights or result in any relief of his symptoms.  At presentation, BNP significantly elevated. Optivol readings from ICD report reviewed and suspect similar presentation to 02/2020.  Echo ordered, pending.   Given low EF, caution with IVF, avoid diltiazem.  Continue IV diuresis as renal function, BP tolerate.  Continue to monitor I's/O's, daily standing weights.  Reds vest recommended, CHF education. Reviewed  fluids/Na.  Daily BMET.  Continue to monitor renal function and electrolytes.  Close monitoring of vitals, given hypotension and to avoid prerenal AKI.  Consider change of home diuretic regimen from lasix to torsemide or addition of metolazone to PTA lasix. Will determine changes to diuretic regimen pending his response to diuresis this admission.  Given severe PHTN with current low BP and AKI, recommend referral to advanced heart failure clinic/pulmonary hypertension clinic at discharge.  We discussed weaning prednisone as tolerated.  He does follow regularly with pulmonology at [facility].  He has reportedly attempted to cut back on his prednisone in the past but was  unsuccessful.  Atrial flutter --Asymptomatic.  EKG and telemetry consistent with atrial flutter, which is likely exacerbating his volume overload as above.  S/p BiV ICD as above.  Failed Tikosyn therapy.  PTA Coumadin.  As above, he does take prednisone 10 mg daily with recommendation to wean as tolerated and is working with pulmonology to wean off this medication.- discussed can exacerbate ventricular rates / volume.  Continue Coreg as BP allows given soft pressures.    Rate has been well controlled with ventricular rate under 110 bpm.    Recommend switching over to anticoagulation with IV heparin during admission and holding PTA coumadin.    Daily CBC.    Given his diverticulitis, ensure no blood loss anemia.    Current H&H stable.  CHA2DS2-VASc or of at least 3.  HTN --Current BP well controlled/soft.  Diverticulitis --Recent ED visit for diverticulitis.  As above, recommend close monitoring of CBC given he is on PTA Coumadin for atrial flutter with diverticulitis and to ensure no blood loss anemia.  In addition, recommend close monitoring of BMET/electrolytes.  Consider that his diverticulitis could be contributing to some of his abdominal pain.  Continue antibiotics.         New York Heart Association (NYHA) Functional Class NYHA Class IV  CHA2DS2-VASc Score = 5  {his indicates a 7.2% annual risk of stroke. The patient's score is based upon: CHF History: Yes HTN History: Yes Diabetes History: No Stroke History: No Vascular Disease History: Yes Age Score: 2 Gender Score: 0        For questions or updates, please contact [place] Please consult [email or web address] for contact info under    Signed, Arvil Chaco, PA-C  06/18/2020 10:35 AM

## 2020-06-18 NOTE — Progress Notes (Signed)
*  PRELIMINARY RESULTS* Echocardiogram 2D Echocardiogram has been performed.  [name] [name] 06/18/2020, 12:31 PM

## 2020-06-19 DIAGNOSIS — I5023 Acute on chronic systolic (congestive) heart failure: Secondary | ICD-10-CM

## 2020-06-19 DIAGNOSIS — I420 Dilated cardiomyopathy: Secondary | ICD-10-CM | POA: Diagnosis not present

## 2020-06-19 DIAGNOSIS — F419 Anxiety disorder, unspecified: Secondary | ICD-10-CM | POA: Diagnosis not present

## 2020-06-19 DIAGNOSIS — I4892 Unspecified atrial flutter: Secondary | ICD-10-CM

## 2020-06-19 LAB — BASIC METABOLIC PANEL
Anion gap: 9 (ref 5–15)
BUN: 33 mg/dL — ABNORMAL HIGH (ref 8–23)
CO2: 26 mmol/L (ref 22–32)
Calcium: 8.2 mg/dL — ABNORMAL LOW (ref 8.9–10.3)
Chloride: 95 mmol/L — ABNORMAL LOW (ref 98–111)
Creatinine, Ser: 1.44 mg/dL — ABNORMAL HIGH (ref 0.61–1.24)
GFR, Estimated: 47 mL/min — ABNORMAL LOW (ref >60)
Glucose, Bld: 88 mg/dL (ref 70–99)
Potassium: 4 mmol/L (ref 3.5–5.1)
Sodium: 130 mmol/L — ABNORMAL LOW (ref 135–145)

## 2020-06-19 LAB — PROTIME-INR
INR: 2 — ABNORMAL HIGH (ref 0.8–1.2)
Prothrombin Time: 22.2 seconds — ABNORMAL HIGH (ref 11.4–15.2)

## 2020-06-19 LAB — BRAIN NATRIURETIC PEPTIDE: B Natriuretic Peptide: 2034.7 pg/mL — ABNORMAL HIGH (ref 0.0–100.0)

## 2020-06-19 MED ORDER — WARFARIN SODIUM 5 MG PO TABS
5.0000 mg | ORAL_TABLET | Freq: Once | ORAL | Status: AC
  Administered 2020-06-19: 5 mg via ORAL
  Filled 2020-06-19: qty 1

## 2020-06-19 MED ORDER — PREDNISONE 10 MG PO TABS
10.0000 mg | ORAL_TABLET | Freq: Every day | ORAL | Status: DC
  Administered 2020-06-19 – 2020-06-21 (×3): 10 mg via ORAL
  Filled 2020-06-19 (×3): qty 1

## 2020-06-19 MED ORDER — FUROSEMIDE 10 MG/ML IJ SOLN
60.0000 mg | Freq: Two times a day (BID) | INTRAMUSCULAR | Status: DC
  Administered 2020-06-19 – 2020-06-21 (×4): 60 mg via INTRAVENOUS
  Filled 2020-06-19 (×4): qty 6

## 2020-06-19 NOTE — Evaluation (Signed)
Physical Therapy Evaluation Patient Details Name: [name] MRN: [id number] DOB: 1931-08-22 Today's Date: 06/19/2020   History of Present Illness  Pt is an 87 y/o M admitted on 06/17/20 with c/c of worsening SOB x 1 week. CT abdomen pelvis without contrast showed moderate-sized bilateral pleural effusion, R>L. PMH: HTN, anxiety, a-flutter, aortic valve regurgitation, BPH, chronic combined heart failure, L BBB, pulmonary HTN  Clinical Impression  Pt seen for PT evaluation with wife present. Pt appears somewhat discouraged re: physical decline. Pt notes fall on 2/17 resulting in R clavicle fx with Dr. Sabra Heck recommending sling (per secure chat Dr. Sabra Heck continues to recommend RUE sling & no stress to R shoulder). Pt is able to ambulate 100 ft with LUE HHA min assist with decreased gait speed but no LOB. Pt reports "I feel like I can walk again but I need to rest right now". Will continue to follow pt acutely for gait training with LRAD while maintaining RUE precautions, balance & endurance training. Pt would benefit from HHPT f/u upon d/c.     Follow Up Recommendations Home health PT;Supervision for mobility/OOB    Equipment Recommendations  3in1 (PT)    Recommendations for Other Services OT consult     Precautions / Restrictions Precautions Precautions: Fall Required Braces or Orthoses: Sling (RUE) Restrictions Weight Bearing Restrictions: Yes RUE Weight Bearing: Non weight bearing (in sling; 2/2 R clavicle fx 2/17 (per conversation with Dr. Sabra Heck))      Mobility  Bed Mobility Overal bed mobility: Needs Assistance Bed Mobility: Sidelying to Sit;Sit to Sidelying   Sidelying to sit: Supervision;HOB elevated     Sit to sidelying: Supervision;HOB elevated General bed mobility comments: L sidelying<>sitting EOB, exiting out of L side of bed    Transfers Overall transfer level: Needs assistance Equipment used: 1 person hand held assist Transfers: Sit to/from  Stand Sit to Stand: Min guard            Ambulation/Gait Ambulation/Gait assistance: Min Web designer (Feet): 100 Feet Assistive device: 1 person hand held assist Gait Pattern/deviations: Decreased step length - right;Decreased step length - left;Decreased stride length Gait velocity: decreased   General Gait Details: LUE HHA, no LOB  Stairs            Wheelchair Mobility    Modified Rankin (Stroke Patients Only)       Balance Overall balance assessment: Needs assistance;Mild deficits observed, not formally tested   Sitting balance-Leahy Scale: Good Sitting balance - Comments: supervision sitting EOB     Standing balance-Leahy Scale: Fair                               Pertinent Vitals/Pain Pain Assessment: No/denies pain    Home Living Family/patient expects to be discharged to:: Private residence Living Arrangements: Spouse/significant other Available Help at Discharge: Family;Available 24 hours/day Type of Home: House Home Access: Level entry     Home Layout: One level        Prior Function Level of Independence: Independent         Comments: without AD, 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, 1 fall immediately prior to admission     Hand Dominance   Dominant Hand: Right    Extremity/Trunk Assessment   Upper Extremity Assessment Upper Extremity Assessment: Generalized weakness (bruising to RUE)    Lower Extremity Assessment Lower Extremity Assessment: Generalized weakness    Cervical / Trunk Assessment Cervical /  Trunk Assessment:  (bruising to R side of body)  Communication   Communication: No difficulties  Cognition Arousal/Alertness: Awake/alert Behavior During Therapy: WFL for tasks assessed/performed Overall Cognitive Status: Within Functional Limits for tasks assessed                                 General Comments: Very pleasant gentleman, states "my mind is good, but my body is not"       General Comments General comments (skin integrity, edema, etc.): pt on room air during session, SpO2 90% or > during session, HR 66 bpm during gait    Exercises     Assessment/Plan    PT Assessment Patient needs continued PT services  PT Problem List Decreased strength;Decreased mobility;Decreased knowledge of precautions;Decreased activity tolerance;Decreased range of motion;Cardiopulmonary status limiting activity;Decreased skin integrity;Decreased knowledge of use of DME;Decreased balance       PT Treatment Interventions Therapeutic activities;DME instruction;Modalities;Gait training;Therapeutic exercise;Patient/family education;Stair training;Balance training;Functional mobility training;Neuromuscular re-education;Manual techniques    PT Goals (Current goals can be found in the Care Plan section)  Acute Rehab PT Goals Patient Stated Goal: get better physically PT Goal Formulation: With patient Time For Goal Achievement: 07/03/20 Potential to Achieve Goals: Good    Frequency Min 2X/week   Barriers to discharge        Co-evaluation               AM-PAC PT "6 Clicks" Mobility  Outcome Measure Help needed turning from your back to your side while in a flat bed without using bedrails?: None Help needed moving from lying on your back to sitting on the side of a flat bed without using bedrails?: A Little Help needed moving to and from a bed to a chair (including a wheelchair)?: A Little Help needed standing up from a chair using your arms (e.g., wheelchair or bedside chair)?: A Little Help needed to walk in hospital room?: A Little Help needed climbing 3-5 steps with a railing? : A Little 6 Click Score: 19    End of Session Equipment Utilized During Treatment: Gait belt Activity Tolerance: Patient tolerated treatment well;Patient limited by fatigue Patient left: in bed;with call bell/phone within reach;with bed alarm set;with family/visitor present Nurse Communication:  Mobility status PT Visit Diagnosis: Unsteadiness on feet (R26.81);Muscle weakness (generalized) (M62.81)    Time: 6803-2122 PT Time Calculation (min) (ACUTE ONLY): 12 min   Charges:   PT Evaluation $PT Eval Low Complexity: [place], PT, DPT 06/19/20, 3:27 PM   Waunita Schooner 06/19/2020, 3:25 PM

## 2020-06-19 NOTE — Progress Notes (Addendum)
[place] [facility] [place] NAME: [name]    MR#:  [id number]  DATE OF BIRTH:  1931/12/28  SUBJECTIVE:  came in with increasing shortness of breath for two weeks. Had recent falls at home with right shoulder ecchymosis.   Wife and dter at bedside. Patient looks quite exhausted/fatigued from not sleeping last night well. Apparently his room was flooded with water and had to be taken out in the middle of the night to get it cleaned. A bit anxious tdoay  REVIEW OF SYSTEMS:   Review of Systems  Constitutional: Negative for chills, fever and weight loss.  HENT: Negative for ear discharge, ear pain and nosebleeds.   Eyes: Negative for blurred vision, pain and discharge.  Respiratory: Positive for shortness of breath. Negative for sputum production, wheezing and stridor.   Cardiovascular: Negative for chest pain, palpitations, orthopnea and PND.  Gastrointestinal: Negative for abdominal pain, diarrhea, nausea and vomiting.  Genitourinary: Negative for frequency and urgency.  Musculoskeletal: Negative for back pain and joint pain.  Neurological: Positive for weakness. Negative for sensory change, speech change and focal weakness.  Psychiatric/Behavioral: Negative for depression and hallucinations. The patient is not nervous/anxious.    Tolerating Diet:yes Tolerating PT: pending  VITALS:  Blood pressure (!) 116/57, pulse (!) 108, temperature 98.1 F (36.7 C), temperature source Axillary, resp. rate 18, height 5\' 4"  (1.626 m), weight 66.9 kg, SpO2 99 %.  PHYSICAL EXAMINATION:   Physical Exam  GENERAL:  87 y.o.-year-old patient lying in the bed with no acute distress.  LUNGS: decreased breath sounds bilaterally, no wheezing, rales, rhonchi. No use of accessory muscles of respiration.  CARDIOVASCULAR: S1, S2 normal. No murmurs, rubs, or gallops.  ABDOMEN: Soft, nontender, nondistended. Bowel sounds present. No organomegaly or mass.   EXTREMITIES: No cyanosis, clubbing  ++edema b/l.    NEUROLOGIC: Cranial nerves II through XII are intact. No focal Motor or sensory deficits b/l.   PSYCHIATRIC:  patient is alert and oriented x 3.  SKIN: No obvious rash, lesion, or ulcer.   LABORATORY PANEL:  CBC Recent Labs  Lab 06/18/20 0542  WBC 7.8  HGB 12.6*  HCT 36.6*  PLT 142*    Chemistries  Recent Labs  Lab 06/17/20 1555 06/18/20 0542 06/19/20 0600  NA 124*   < > 130*  K 5.6*   < > 4.0  CL 90*   < > 95*  CO2 26   < > 26  GLUCOSE 124*   < > 88  BUN 39*   < > 33*  CREATININE 1.60*   < > 1.44*  CALCIUM 8.2*   < > 8.2*  AST 33  --   --   ALT 24  --   --   ALKPHOS 97  --   --   BILITOT 1.5*  --   --    < > = values in this interval not displayed.   Cardiac Enzymes No results for input(s): TROPONINI in the last 168 hours. RADIOLOGY:  CT ABDOMEN PELVIS WO CONTRAST  Result Date: 06/17/2020 CLINICAL DATA:  Bloated the generalized abdominal pain. EXAM: CT ABDOMEN AND PELVIS WITHOUT CONTRAST TECHNIQUE: Multidetector CT imaging of the abdomen and pelvis was performed following the standard protocol without IV contrast. COMPARISON:  June 14, 2020 FINDINGS: Lower chest: There is moderate severity cardiomegaly with a small pericardial effusion. Mild atelectasis and/or infiltrate is seen within the bilateral lung bases, right greater than left. Small to moderate sized bilateral  pleural effusions are noted. Hepatobiliary: No focal liver abnormality is seen. Status post cholecystectomy. No biliary dilatation. Pancreas: Unremarkable. No pancreatic ductal dilatation or surrounding inflammatory changes. Spleen: Normal in size without focal abnormality. Adrenals/Urinary Tract: Adrenal glands are unremarkable. Kidneys are normal, without renal calculi, focal lesion, or hydronephrosis. Mildly diluted contrast is seen within the lumen of an otherwise normal appearing urinary bladder. Stomach/Bowel: There is a small hiatal hernia. Appendix  appears normal. No evidence of bowel dilatation. A large amount of stool is seen throughout the colon. Numerous noninflamed diverticula are seen throughout the large bowel. The mildly inflamed diverticulum seen within the sigmoid colon the prior study is no longer visualized. Vascular/Lymphatic: Aortic atherosclerosis. 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 No enlarged abdominal or pelvic lymph nodes. Reproductive: Prostate is unremarkable. A penile pump and associated reservoir are seen. Other: A 3.9 cm x 2.8 cm left inguinal hernia containing fluid and fat is seen. No abdominopelvic ascites. Musculoskeletal: Approximately 3 mm retrolisthesis of the L2 vertebral body is noted on L3. Multilevel degenerative changes seen throughout the lumbar spine. IMPRESSION: 1. Small to moderate sized bilateral pleural effusions, right greater than left. 2. Mild bibasilar atelectasis and/or infiltrate, right greater than left. 3. Moderate severity cardiomegaly with a small pericardial effusion. 4. Colonic diverticulosis. 5. Large stool burden without evidence of bowel obstruction. 6. Small hiatal hernia. 7. Approximately 3 mm retrolisthesis of the L2 vertebral body on L3 with multilevel degenerative changes throughout the lumbar spine. 8. Aortic atherosclerosis. Aortic Atherosclerosis (ICD10-I70.0). Electronically Signed   By: Virgina Norfolk M.D.   On: 06/17/2020 16:03   DG Chest 2 View  Result Date: 06/17/2020 CLINICAL DATA:  Dyspnea.  Extremity swelling. EXAM: CHEST - 2 VIEW COMPARISON:  Most recent CT 11/04/2017. Lung bases from abdominal CT earlier today reviewed. FINDINGS: Multi lead left-sided pacemaker in place. The heart is enlarged. Stable mediastinal contours. Small bilateral pleural effusions. Mild vascular congestion without pulmonary edema. No confluent airspace disease. No pneumothorax. Right shoulder arthroplasty, alignment not well assessed on the current exam. There is a E  minimally displaced distal right clavicle fracture, age indeterminate but new from 2019. Chronic superior subluxation of the humeral head. IMPRESSION: 1. Cardiomegaly with vascular congestion and small bilateral pleural effusions. Findings consistent with mild CHF. 2. Minimally displaced distal right clavicle fracture is age indeterminate but new from 2019. Recommend correlation with history of interval injury and focal pain. Electronically Signed   By: Keith Rake M.D.   On: 06/17/2020 16:06   ECHOCARDIOGRAM COMPLETE  Result Date: 06/18/2020    ECHOCARDIOGRAM REPORT   Patient Name:   [name] Date of Exam: 06/18/2020 Medical Rec #:  [id number]             Height:       64.0 in Accession #:    [id number]            Weight:       147.2 lb Date of Birth:  July 22, 1931             BSA:          1.717 m Patient Age:    57 years              BP:           100/72 mmHg Patient Gender: M                     HR:  66 bpm. Exam Location:  [facility] Procedure: 2D Echo Indications:     Dyspnea R06.00                  CHF I50.21  History:         Patient has prior history of Echocardiogram examinations, most                  recent 04/22/2017.  Sonographer:     [place] Referring Phys:  [id number] Arvil Chaco Diagnosing Phys: Ida Rogue MD IMPRESSIONS  1. Left ventricular ejection fraction, by estimation, is 20 to 25%. The left ventricle has severely decreased function. The left ventricle demonstrates global hypokinesis. The left ventricular internal cavity size was mildly dilated. Left ventricular diastolic parameters are indeterminate.  2. Right ventricular systolic function is normal. The right ventricular size is normal. There is moderately elevated pulmonary artery systolic pressure. The estimated right ventricular systolic pressure is 68.3 mmHg.  3. Left atrial size was severely dilated.  4. Right atrial size was moderately dilated.  5. The mitral valve is normal in structure. Mild  mitral valve regurgitation. No evidence of mitral stenosis.  6. Tricuspid valve regurgitation is mild to moderate.  7. The inferior vena cava is dilated in size with <50% respiratory variability, suggesting right atrial pressure of 15 mmHg. FINDINGS  Left Ventricle: Left ventricular ejection fraction, by estimation, is 20 to 25%. The left ventricle has severely decreased function. The left ventricle demonstrates global hypokinesis. The left ventricular internal cavity size was mildly dilated. There is no left ventricular hypertrophy. Left ventricular diastolic parameters are indeterminate. Right Ventricle: The right ventricular size is normal. No increase in right ventricular wall thickness. Right ventricular systolic function is normal. There is moderately elevated pulmonary artery systolic pressure. The tricuspid regurgitant velocity is 3.00 m/s, and with an assumed right atrial pressure of 20 mmHg, the estimated right ventricular systolic pressure is 41.9 mmHg. Left Atrium: Left atrial size was severely dilated. Right Atrium: Right atrial size was moderately dilated. Pericardium: There is no evidence of pericardial effusion. Mitral Valve: The mitral valve is normal in structure. Mild mitral valve regurgitation. No evidence of mitral valve stenosis. Tricuspid Valve: The tricuspid valve is normal in structure. Tricuspid valve regurgitation is mild to moderate. No evidence of tricuspid stenosis. Aortic Valve: The aortic valve is normal in structure. Aortic valve regurgitation is not visualized. No aortic stenosis is present. Aortic valve peak gradient measures 6.2 mmHg. Pulmonic Valve: The pulmonic valve was normal in structure. Pulmonic valve regurgitation is not visualized. No evidence of pulmonic stenosis. Aorta: The aortic root is normal in size and structure. Venous: The inferior vena cava is dilated in size with less than 50% respiratory variability, suggesting right atrial pressure of 15 mmHg. IAS/Shunts: No  atrial level shunt detected by color flow Doppler. Additional Comments: A device lead is visualized.  LEFT VENTRICLE PLAX 2D LVIDd:         6.17 cm      Diastology LVIDs:         5.56 cm      LV e' lateral: 6.85 cm/s LV PW:         1.25 cm LV IVS:        1.09 cm LVOT diam:     1.80 cm LV SV:         22 LV SV Index:   13 LVOT Area:     2.54 cm  LV Volumes (MOD) LV vol d,  MOD A2C: 123.0 ml LV vol d, MOD A4C: 116.0 ml LV vol s, MOD A2C: 92.7 ml LV vol s, MOD A4C: 84.0 ml LV SV MOD A2C:     30.3 ml LV SV MOD A4C:     116.0 ml LV SV MOD BP:      28.6 ml RIGHT VENTRICLE RV Basal diam:  2.81 cm RV S prime:     8.49 cm/s TAPSE (M-mode): 0.9 cm LEFT ATRIUM             Index       RIGHT ATRIUM           Index LA diam:        6.40 cm 3.73 cm/m  RA Area:     30.50 cm LA Vol (A2C):   87.5 ml 50.95 ml/m RA Volume:   103.00 ml 59.97 ml/m LA Vol (A4C):   94.1 ml 54.79 ml/m LA Biplane Vol: 92.4 ml 53.80 ml/m  AORTIC VALVE                PULMONIC VALVE AV Area (Vmax): 0.96 cm    PV Vmax:       0.80 m/s AV Vmax:        124.00 cm/s PV Peak grad:  2.6 mmHg AV Peak Grad:   6.2 mmHg LVOT Vmax:      47.00 cm/s LVOT Vmean:     31.200 cm/s LVOT VTI:       0.086 m  AORTA Ao Root diam: 3.20 cm TRICUSPID VALVE TV Peak grad:   31.5 mmHg TV Vmax:        2.80 m/s TR Peak grad:   36.0 mmHg TR Vmax:        300.00 cm/s  SHUNTS Systemic VTI:  0.09 m Systemic Diam: 1.80 cm Ida Rogue MD Electronically signed by Ida Rogue MD Signature Date/Time: 06/18/2020/7:47:28 PM    Final    ASSESSMENT AND PLAN:  [name] is a 87 y.o. male with medical history significant for hypertension, anxiety, atrial flutter, aortic valve regurgitation, BPH, chronic combined heart failure, left bundle branch block, pulmonary hypertension, presented to the emergency department for chief concerns of shortness of breath. He reports worsening shortness of breath over 1 week from baseline. Pt says has gained 5-6 pounds in one week  Acute on chronic  systolic heart failure (EF 20-25%, 2019) severe CMP Severe pulmonary hypertension Nonischemic cardiomyopathy s/p AICD -- cont IV lasix, I/o, daily weight --oxygen as needed --appreciated [facility] input -- given sever Pulm HTN recommends f/u with out pt CHF/pilm clinic  --Echo on 04/22/2017: EF 20 to 25%, pulmonary hypertension, grade 2 diastolic dysfunction --echo 06/18/2020:EF 20-25% severe CMP --UOP about 1500 cc (total) -sats 99% on RA  Chronic cough with dyspnea --pt follows with Dr Lanney Gins --he has been on po prednisone 10 mg qd for >2 years.He wants me to resume it --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   -- telemetry consistent with atrial flutter, which is likely exacerbating his volume overload as above.  S/p BiV ICD as above.  Failed Tikosyn therapy in the past -cont coreg --cont warfarin --manage per Pharmacy  HTN --Current BP well controlled/soft.  Diverticulitis --Recent ED visit for diverticulitis.  --pt has not been taking his augmentin as prescribed and given lot of GI issues he does not want to cont with  it. Denies any abd pain. --Tolerating po diet    Anxiety--prn xanax  Right shoulder ecchymosis s/p mech fall at home No fracture PT to see pt  BPH--cont flomax  Family communication :wife at bedside Consults :cardiology CODE STATUS: DNR (PTA) DVT Prophylaxis :warfarin Level of care: Progressive Cardiac Status is: Inpatient  Remains inpatient appropriate because:Inpatient level of care appropriate due to severity of illness   Dispo: The patient is from: Home              Anticipated d/c is to: Home              Patient currently is not medically stable to d/c.   Difficult to place patient No  CHF--cont IV diuresis PT to see pt D/c either Mon or Tuesday after discussing with  [facility] cardiology     TOTAL TIME TAKING CARE OF THIS PATIENT: 25 minutes.  >50% time spent on counselling and coordination of care  Note: This dictation was prepared with Dragon dictation along with smaller phrase technology. Any transcriptional errors that result from this process are unintentional.  Fritzi Mandes M.D    [facility]   CC: Primary care physician; Ria Bush, MDPatient ID: [name], male   DOB: 1931/11/22, 87 y.o.   MRN: [id number]

## 2020-06-19 NOTE — Progress Notes (Signed)
ANTICOAGULATION CONSULT NOTE  Pharmacy Consult for Warfarin Indication: atrial fibrillation    Patient Measurements: Height: 5\' 4"  (162.6 cm) Weight: 66.9 kg (147 lb 8 oz) IBW/kg (Calculated) : 59.2  Vital Signs: Temp: 97.6 F (36.4 C) (03/13 0733) Temp Source: Axillary (03/13 0733) BP: 90/57 (03/13 0733) Pulse Rate: 63 (03/13 0733)  Labs: Recent Labs    06/17/20 1555 06/17/20 2005 06/18/20 0542 06/19/20 0600  HGB 13.4  --  12.6*  --   HCT 39.2  --  36.6*  --   PLT 154  --  142*  --   LABPROT  --  20.8* 19.3* 22.2*  INR  --  1.9* 1.7* 2.0*  CREATININE 1.60*  --  1.57* 1.44*  TROPONINIHS 3  --   --   --     Estimated Creatinine Clearance: 30.8 mL/min (A) (by C-G formula based on SCr of 1.44 mg/dL (H)).   Medical History: Past Medical History:  Diagnosis Date  . AICD (automatic cardioverter/defibrillator) present    s/p gen change 04/2015 w/ MDT [name] Crt-D  DTBA1D1, Serial number [id number]  . Allergic rhinitis    [name]  . Anemia   . Anxiety   . Arthritis   . Atrial flutter ([place])    A.  Status post cardioversion; B.  Tikosyn therapy - failed, remains in aflutter  . Bilateral pneumonia 11/02/2013   treated with levaquin  . BPH (benign prostatic hyperplasia)   . 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 Lucky Cowboy)  . Chronic lung disease    spirometry 2015 - no obstruction, + mild restrictive lung disease  . Chronic sinusitis   . Chronic systolic congestive heart failure ([place])   . Dyspnea    with exertion  . Extrinsic asthma    [name]  . Fall with injury 05/28/2017  . GERD (gastroesophageal reflux disease) 2010   h/o esophageal stricture with dilation, LA grade C reflux esophagitis by EGD 2010  . Goiter   . History of diverticulitis of colon 06/2020  . History of GI bleed    Secondary to hemorrhoids  . History of seasonal allergies   . Hypertension   . IBS (irritable bowel syndrome)   . LBBB (left bundle branch block)   .  Multiple pulmonary nodules 06/2013   RUL ([facility]. Adam Phenix at [facility]) - ?vasculitis as of last CT at [facility]  . NICM (nonischemic cardiomyopathy) [facility])    Cardiac catheterization March 2006 without coronary disease; EF 25% 2018 Jan  . Orthopnea   . Porphyria ([place])   . Presence of permanent cardiac pacemaker   . Sinus congestion 09/25/2010    Medications:  Warfarin 2.5 mg on Mondays; 5mg  on all other days (TWD = 32.5 mg) *pt instructed to take 2.5mg  daily from 3/11-3/18/22 while on amoxicillin Pt reports last dose on 06/13/20 - 2.5mg    Assessment: 87yo male with history of congestive heart failure, A flutter, recently seen and started treatment outpatient for diverticulitis. Pt's INR from most recent admission 06/14/20 was 3.9, then 4.8 on 06/15/20; he did not receive warfarin during that admission. Pharmacy has been consulted for warfarin dosing and monitoring. Pt reports his last dose of warfarin was on 2.5mg  on 06/13/20. H&H and plt WNL.   Date INR Dose 3/7 -- 2.5 mg (per patient)  3/8 3.9 Held  (per patient)  3/9 4.8 Held  (per patient)  3/10 -- Held  (per patient)  3/11 1.9 2.5 mg  3/12 1.7 7.5 mg 3/13 2.0    Goal of Therapy:  INR 2-3 Monitor platelets by anticoagulation protocol: Yes   Plan:   INR therapeutic  Continue with home dose 5 mg  INR with morning labs   Tawnya Crook, PharmD Clinical Pharmacist  06/19/2020 9:42 AM
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
## 2020-06-20 DIAGNOSIS — N179 Acute kidney failure, unspecified: Secondary | ICD-10-CM

## 2020-06-20 DIAGNOSIS — I5023 Acute on chronic systolic (congestive) heart failure: Secondary | ICD-10-CM | POA: Diagnosis not present

## 2020-06-20 LAB — BASIC METABOLIC PANEL
Anion gap: 9 (ref 5–15)
BUN: 28 mg/dL — ABNORMAL HIGH (ref 8–23)
CO2: 28 mmol/L (ref 22–32)
Calcium: 8.3 mg/dL — ABNORMAL LOW (ref 8.9–10.3)
Chloride: 98 mmol/L (ref 98–111)
Creatinine, Ser: 1.38 mg/dL — ABNORMAL HIGH (ref 0.61–1.24)
GFR, Estimated: 50 mL/min — ABNORMAL LOW (ref >60)
Glucose, Bld: 99 mg/dL (ref 70–99)
Potassium: 3.7 mmol/L (ref 3.5–5.1)
Sodium: 135 mmol/L (ref 135–145)

## 2020-06-20 LAB — PROTIME-INR
INR: 2.7 — ABNORMAL HIGH (ref 0.8–1.2)
Prothrombin Time: 27.7 seconds — ABNORMAL HIGH (ref 11.4–15.2)

## 2020-06-20 NOTE — Consult Note (Signed)
   Heart Failure Nurse Navigator Note  HFrEf 20 to 25%.  Global hypokinesis.  Right ventricular systolic function is normal.  Moderately elevated pulmonary artery systolic pressures.  Left atria was severely dilated.  Right atrium moderately dilated.  Mild mitral regurgitation.  Mild to moderate tricuspid regurgitation.  He presented from home due to worsening shortness of breath, weight gain of 5 to 6 pounds in 1 week, lower extremity edema and abdominal distention.  Comorbidities:  Permanent atrial flutter Chronic kidney disease stage III Hypertension COPD Permanent atrial flutter status post failed ablation  Has history of insertion of Medtronic BiV ICD.  Medications:  Coreg 9.375 mg 2 times a day Furosemide 60 mg IV 2 times a day Warfarin  Labs:  Sodium 135, potassium 3.7, chloride 98, CO2 28, BUN 28 down from 33 of yesterday, creatinine 1.38 down from 1.44 of yesterday, BNP yesterday was 2034, Weight on the 12th was 66.9 nothing documented for the 13th in the 14th. BMI 25 Blood pressure 111/69   Assessment:  General-he is awake and alert lying in bed visiting with family in no acute distress.  HEENT-wears glasses, normocephalic.   JVD.  Cardiac-tones of regular rate and rhythm.  Murmurs or rubs appreciated.  Abdomen-distended nontender  Extremities-lower extremity edema, worse in the feet.  Psych-is pleasant and appropriate.  Neurologic-speech is clear, moves all extremities.        Initial meeting with patient, wife and son.  Discussed his diet, they mostly eat at home she no longer cooks with salt nor do they added at the table.  Denied any dietary indiscretions prior to admission  In discussing his fluid restriction he limits himself to 32 ounces a day.  Prior to admission he had missed a couple doses of his Lasix due to concern over not eating/drinking and being constipated.   Discussed weighing daily first thing in the morning after going to the  bathroom and recording.  Patient states he also weighs himself in the evening but I told him for the sake of what to report he just needs to pay attention to that weight in the morning and go by the 2 to 3 pound or 5 pound in a week weight gain.  Also discussed the importance of following up in the heart failure clinic, he was given information and an appointment time.  Family also given low-sodium cookbook and dietary low-sodium and fall.   We will continue to follow along.  Pricilla Riffle RN [facility]

## 2020-06-20 NOTE — Progress Notes (Signed)
[facility] [place] NAME: [name]    MR#:  [id number]  DATE OF BIRTH:  07-31-1931  SUBJECTIVE:  Patient sitting in a chair, shares feeling about 50% better than when he first came in.  Still above his dry weight of about 138 pounds per patient/son.  He weight 142 pounds this morning per son. Patient's wife and son at bedside.  Patient is tired and sleepy REVIEW OF SYSTEMS:   Review of Systems  Constitutional: Positive for malaise/fatigue. Negative for chills, fever and weight loss.  HENT: Negative for ear discharge, ear pain and nosebleeds.   Eyes: Negative for blurred vision, pain and discharge.  Respiratory: Positive for shortness of breath. Negative for sputum production, wheezing and stridor.   Cardiovascular: Negative for chest pain, palpitations, orthopnea and PND.  Gastrointestinal: Negative for abdominal pain, diarrhea, nausea and vomiting.  Genitourinary: Negative for frequency and urgency.  Musculoskeletal: Negative for back pain and joint pain.  Neurological: Positive for weakness. Negative for sensory change, speech change and focal weakness.  Psychiatric/Behavioral: Negative for depression and hallucinations. The patient is not nervous/anxious.    Tolerating Diet:yes Tolerating PT: pending  VITALS:  Blood pressure 101/62, pulse (!) 42, temperature 97.9 F (36.6 C), resp. rate 17, height 5\' 4"  (1.626 m), weight 66.9 kg, SpO2 97 %.  PHYSICAL EXAMINATION:   Physical Exam  GENERAL:  87 y.o.-year-old patient lying in the bed with no acute distress.  LUNGS: decreased breath sounds bilaterally, no wheezing, rales, rhonchi. No use of accessory muscles of respiration.  CARDIOVASCULAR: S1, S2 normal. No murmurs, rubs, or gallops.  JVD present ABDOMEN: Soft, nontender, nondistended. Bowel sounds present. No organomegaly or mass.  EXTREMITIES: No cyanosis, clubbing, +edema b/l.    NEUROLOGIC: Cranial nerves II through XII  are intact. No focal Motor or sensory deficits b/l.   PSYCHIATRIC:  patient is alert and oriented x 3.  SKIN: No obvious rash, lesion, or ulcer.   LABORATORY PANEL:  CBC Recent Labs  Lab 06/18/20 0542  WBC 7.8  HGB 12.6*  HCT 36.6*  PLT 142*    Chemistries  Recent Labs  Lab 06/17/20 1555 06/18/20 0542 06/20/20 1141  NA 124*   < > 135  K 5.6*   < > 3.7  CL 90*   < > 98  CO2 26   < > 28  GLUCOSE 124*   < > 99  BUN 39*   < > 28*  CREATININE 1.60*   < > 1.38*  CALCIUM 8.2*   < > 8.3*  AST 33  --   --   ALT 24  --   --   ALKPHOS 97  --   --   BILITOT 1.5*  --   --    < > = values in this interval not displayed.   Cardiac Enzymes No results for input(s): TROPONINI in the last 168 hours. RADIOLOGY:  No results found. ASSESSMENT AND PLAN:  [name] is a 87 y.o. male with medical history significant for hypertension, anxiety, atrial flutter, aortic valve regurgitation, BPH, chronic combined heart failure, left bundle branch block, pulmonary hypertension, presented to the emergency department for chief concerns of shortness of breath. He reports worsening shortness of breath over 1 week from baseline. Pt says has gained 5-6 pounds in one week  Acute on chronic systolic heart failure (EF 20-25%, 2019) severe CMP Severe pulmonary hypertension Nonischemic cardiomyopathy s/p AICD -- cont 60 mg IV lasix,  I/o, daily weight Net IO Since Admission: -2,080 mL [06/20/20 1547] --Cardiology following, potentially switching to oral torsemide tomorrow -- given sever Pulm HTN recommends f/u with out pt CHF/pulm clinic  --Echo on 04/22/2017: EF 20 to 25%, pulmonary hypertension, grade 2 diastolic dysfunction --echo 06/18/2020:EF 20-25% severe CMP  Chronic cough with dyspnea --pt follows with Dr Lanney Gins --he has been on po prednisone 10 mg qd for >2 years.continue same --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   -- telemetry consistent with atrial flutter, which is likely exacerbating his volume overload as above.  S/p BiV ICD as above.  Failed Tikosyn therapy in the past -cont coreg --cont warfarin, INR 2.7--manage per Pharmacy  HTN --Current BP well controlled/soft.  Diverticulitis --Recent ED visit for diverticulitis.  --pt has not been taking his augmentin as prescribed and given lot of GI issues he does not want to cont with it. Denies any abd pain. --Tolerating po diet    Anxiety--prn xanax  Right shoulder ecchymosis s/p mech fall at home No fracture PT/OT recommends home health  BPH--cont flomax  Family communication :wife and patient's son at bedside on 3/14 and are updated Consults :cardiology CODE STATUS: DNR (PTA) DVT Prophylaxis :warfarin Level of care: Progressive Cardiac Status is: Inpatient  Remains inpatient appropriate because:Inpatient level of care appropriate due to severity of illness   Dispo: The patient is from: Home              Anticipated d/c is to: Home with home health              Patient currently is not medically stable to d/c.   Difficult to place patient No  CHF--cont IV diuresis PT/OT recommends home health Possible discharge in next 1 to 2 days depending on his clinical condition and progress.  For now requiring ongoing diuresis with good response     TOTAL TIME TAKING CARE OF THIS PATIENT: 25 minutes.  >50% time spent on counselling and coordination of care  Note: This dictation was prepared with Dragon dictation along with smaller phrase technology. Any transcriptional errors that result from this process are unintentional.  Max Sane M.D    [facility]   CC: Primary care physician; Ria Bush,  MDPatient ID: [name], male   DOB: Mar 03, 1932, 87 y.o.   MRN: [id number]

## 2020-06-20 NOTE — Evaluation (Signed)
Occupational Therapy Evaluation Patient Details Name: [name] MRN: [id number] DOB: 08-30-1931 Today's Date: 06/20/2020    History of Present Illness Pt is an 87 y/o M admitted on 06/17/20 with c/c of worsening SOB x 1 week. CT abdomen pelvis without contrast showed moderate-sized bilateral pleural effusion, R>L. PMH: HTN, anxiety, a-flutter, aortic valve regurgitation, BPH, chronic combined heart failure, L BBB, pulmonary HTN   Clinical Impression   Pt seen for OT evaluation this date. 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 Pt not on O2 at home. Pt reports becoming increasingly fatigued or out of breath with minimal exertion. Pt currently requires supervision to CGA to PRN MIN A for ADL transfers and LB ADL tasks due to current functional impairments (See OT Problem List below). Pt maintained SpO2 >94% on room air. VC for RW mgt to minimize RUE weightbearing after pt and family requests use of 2WW and pt declines use of sling for RUE. Pt able to gently place R hand on RW without additional significant weightbearing. Pt/spouse/son educated in energy conservation strategies including pursed lip breathing, activity pacing, pursed lip breathing, home/routines modifications, work simplification, AE/DME, prioritizing of meaningful occupations, and falls prevention. Handout provided. Pt verbalized understanding and would benefit from additional skilled OT services to maximize recall and carryover of learned techniques and facilitate implementation of learned techniques into daily routines. Do not anticipate need for skilled OT strategies upon discharge at this time.       Follow Up Recommendations  No OT follow up;Other (comment) (supervision/assist for OOB)    Equipment Recommendations  Other (comment) (2WW)     Recommendations for Other Services       Precautions / Restrictions Precautions Precautions: Fall Required Braces or Orthoses: Sling (RUE) Restrictions Weight Bearing Restrictions: Yes RUE Weight Bearing: Non weight bearing Other Position/Activity Restrictions: RUE NWBing in sling per MD, however pt declines to wear sling or maintain NWBing      Mobility Bed Mobility Overal bed mobility: Needs Assistance Bed Mobility: Sidelying to Sit;Sit to Sidelying;Rolling Rolling: Modified independent (Device/Increase time) Sidelying to sit: Supervision;HOB elevated     Sit to sidelying: Supervision;HOB elevated      Transfers Overall transfer level: Needs assistance Equipment used: Rolling walker (2 wheeled) Transfers: Sit to/from Stand Sit to Stand: Supervision         General transfer comment: pt requests 2WW reporting feeling more comfortable with it, does not put weight through RUE, just rests R hand on RW handle    Balance Overall balance assessment: Needs assistance Sitting-balance support: No upper extremity supported;Feet supported Sitting balance-Leahy Scale: Good     Standing balance support: Bilateral upper extremity supported Standing balance-Leahy Scale: Fair                             ADL either performed or assessed with clinical judgement   ADL Overall ADL's : Needs assistance/impaired                                       General ADL Comments: PRN Min A for LB ADL, however, pt able to don socks seated EOB using figure-4 pattern and incorporates RUE into task without difficulty despite  instruction to minimize use 2/2 recent clavicle fx and MD restrictions for RUE use. Supervision for ADL transfers with 2WW (pt prefers over [facility]). Fatigues, but SpO2 remains >94% on room air     Vision Patient Visual Report: No change from baseline       Perception     Praxis      Pertinent Vitals/Pain Pain Assessment: No/denies pain      Hand Dominance Right   Extremity/Trunk Assessment Upper Extremity Assessment Upper Extremity Assessment: Generalized weakness;RUE deficits/detail RUE Deficits / Details: recent clavicle fx, pt denies pain, declines use of sling RUE Coordination: decreased gross motor   Lower Extremity Assessment Lower Extremity Assessment: Generalized weakness   Cervical / Trunk Assessment Cervical / Trunk Assessment: Other exceptions Cervical / Trunk Exceptions: bruising to R side of body   Communication Communication Communication: No difficulties   Cognition Arousal/Alertness: Awake/alert Behavior During Therapy: WFL for tasks assessed/performed Overall Cognitive Status: Within Functional Limits for tasks assessed                                     General Comments  On room air, SpO2 >95%    Exercises Other Exercises Other Exercises: Pt/son/spouse instructed in energy conservation strategies (handout provided) to support safety/indep with ADL/IADL including getting back to the greenhouse Other Exercises: RW mgt, ADL transfers, PLB   Shoulder Instructions      Home Living Family/patient expects to be discharged to:: Private residence Living Arrangements: Spouse/significant other Available Help at Discharge: Family;Available 24 hours/day Type of Home: House Home Access: Level entry     Home Layout: One level     Bathroom Shower/Tub: Tub/shower unit;Curtain   Bathroom Toilet: Handicapped height     Home Equipment: [place] - single point;Walker - standard          Prior Functioning/Environment Level of Independence: Independent        Comments: without AD, 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, 1 fall immediately prior to admission, typically washes dishes; spouse provides transportation, meal prep, cleaning; pt indep with med mgt        OT Problem List: Impaired UE functional use;Impaired balance (sitting and/or standing);Decreased knowledge of use of DME or  AE;Decreased activity tolerance;Decreased strength      OT Treatment/Interventions: Self-care/ADL training;Therapeutic exercise;Therapeutic activities;Energy conservation;DME and/or AE instruction;Patient/family education;Balance training    OT Goals(Current goals can be found in the care plan section) Acute Rehab OT Goals Patient Stated Goal: go home and get better OT Goal Formulation: With patient/family Time For Goal Achievement: 07/04/20 Potential to Achieve Goals: Good ADL Goals Additional ADL Goal #1: Pt will perform morning ADL routine with remote supervision and VSS with PRN VC to implement learned ECS. Additional ADL Goal #2: Pt will verbalize plan to implement at least 2 learned ECS to support safety/independence with ADL/IADL .  OT Frequency: Min 1X/week   Barriers to D/C:            Co-evaluation              AM-PAC OT "6 Clicks" Daily Activity     Outcome Measure Help from another person eating meals?: None Help from another person taking care of personal grooming?: A Little Help from another person toileting, which includes using toliet, bedpan, or urinal?: A Little Help from another person bathing (including washing, rinsing, drying)?: A Little Help from another person to put on and taking  off regular upper body clothing?: A Little Help from another person to put on and taking off regular lower body clothing?: A Little 6 Click Score: 19   End of Session Equipment Utilized During Treatment: Gait belt;Rolling walker Nurse Communication: Other (comment) (2ww use, on room air)  Activity Tolerance: Patient tolerated treatment well Patient left: in bed;with call bell/phone within reach;with bed alarm set;with family/visitor present  OT Visit Diagnosis: Other abnormalities of gait and mobility (R26.89);Muscle weakness (generalized) (M62.81)                Time: 8413-2440 OT Time Calculation (min): 38 min Charges:  OT General Charges $OT Visit: 1 Visit OT  Evaluation $OT Eval Moderate Complexity: 1 Mod OT Treatments $Self Care/Home Management : 23-37 mins  Hanley Hays, MPH, MS, OTR/L ascom [phone] 06/20/20, 5:01 PM

## 2020-06-20 NOTE — Progress Notes (Signed)
OT Cancellation Note  Patient Details Name: [name] MRN: [id number] DOB: [birth year]   Cancelled Treatment:    Reason Eval/Treat Not Completed: Other (comment). Consult received, chart reviewed. On initial attempt, pt with MD, RN waiting to see pt. On 2nd attempt, nursing with pt. Will re-attempt later this date.   Hanley Hays, MPH, MS, OTR/L ascom [phone] 06/20/20, 10:37 AM

## 2020-06-20 NOTE — Progress Notes (Signed)
[facility] for Warfarin Indication: atrial fibrillation    Patient Measurements: Height: 5\' 4"  (162.6 cm) Weight: 66.9 kg (147 lb 8 oz) IBW/kg (Calculated) : 59.2  Vital Signs: Temp: 98 F (36.7 C) (03/14 0753) Temp Source: Oral (03/14 0016) BP: 94/71 (03/14 0753) Pulse Rate: 60 (03/14 0753)  Labs: Recent Labs    06/17/20 1555 06/17/20 2005 06/18/20 0542 06/19/20 0600 06/20/20 0412  HGB 13.4  --  12.6*  --   --   HCT 39.2  --  36.6*  --   --   PLT 154  --  142*  --   --   LABPROT  --    < > 19.3* 22.2* 27.7*  INR  --    < > 1.7* 2.0* 2.7*  CREATININE 1.60*  --  1.57* 1.44*  --   TROPONINIHS 3  --   --   --   --    < > = values in this interval not displayed.    Estimated Creatinine Clearance: 30.8 mL/min (A) (by C-G formula based on SCr of 1.44 mg/dL (H)).   Medical History: Past Medical History:  Diagnosis Date  . AICD (automatic cardioverter/defibrillator) present    s/p gen change 04/2015 w/ MDT [name] Crt-D  DTBA1D1, Serial number [id number]  . Allergic rhinitis    [name]  . Anemia   . Anxiety   . Arthritis   . Atrial flutter ([place])    A.  Status post cardioversion; B.  Tikosyn therapy - failed, remains in aflutter  . Bilateral pneumonia 11/02/2013   treated with levaquin  . BPH (benign prostatic hyperplasia)   . 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 Lucky Cowboy)  . Chronic lung disease    spirometry 2015 - no obstruction, + mild restrictive lung disease  . Chronic sinusitis   . Chronic systolic congestive heart failure ([place])   . Dyspnea    with exertion  . Extrinsic asthma    [name]  . Fall with injury 05/28/2017  . GERD (gastroesophageal reflux disease) 2010   h/o esophageal stricture with dilation, LA grade C reflux esophagitis by EGD 2010  . Goiter   . History of diverticulitis of colon 06/2020  . History of GI bleed    Secondary to hemorrhoids  . History of seasonal allergies   .  Hypertension   . IBS (irritable bowel syndrome)   . LBBB (left bundle branch block)   . Multiple pulmonary nodules 06/2013   RUL ([facility]. Adam Phenix at [facility]) - ?vasculitis as of last CT at [facility]  . NICM (nonischemic cardiomyopathy) [facility])    Cardiac catheterization March 2006 without coronary disease; EF 25% 2018 Jan  . Orthopnea   . 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 Presence of permanent cardiac pacemaker   . Sinus congestion 09/25/2010    Medications:  Warfarin 2.5 mg on Mondays; 5mg  on all other days (TWD = 32.5 mg) *pt instructed to take 2.5mg  daily from 3/11-3/18/22 while on amoxicillin Pt reports last dose on 06/13/20 - 2.5mg    Assessment: 87yo male with history of congestive heart failure (EF 20-25%), A flutter, recently seen and started treatment outpatient for diverticulitis. Pt's INR from most recent admission 06/14/20 was 3.9, then 4.8 on 06/15/20; he did not receive warfarin during that admission. Pharmacy has been consulted for warfarin dosing and monitoring. Pt reports his last dose of warfarin was on 2.5mg  on 06/13/20. H&H and plt  WNL.    Date INR Dose 3/7 -- 2.5 mg (per patient)  3/8 3.9 Held  (per patient)  3/9 4.8 Held  (per patient)  3/10 -- Held  (per patient)  3/11 1.9 2.5 mg 3/12 1.7 7.5 mg 3/13 2.0 5 mg 3/14 2.7 0 mg (hold x1)   Goal of Therapy:  INR 2-3 Monitor platelets by anticoagulation protocol: Yes   Plan:   INR rapidly therapeutic (delta 0.7)  Will hold x1; likely resume at 2.5mg  pending AM INR trend.  INR with morning labs   Lorna Dibble, PharmD Clinical Pharmacist  06/20/2020 8:11 AM

## 2020-06-20 NOTE — Plan of Care (Signed)
  Problem: Activity: Goal: Capacity to carry out activities will improve Outcome: Progressing   Problem: Cardiac: Goal: Ability to achieve and maintain adequate cardiopulmonary perfusion will improve Outcome: Progressing   

## 2020-06-20 NOTE — Progress Notes (Signed)
Progress Note  Patient Name: [name] Date of Encounter: 06/20/2020  Primary Cardiologist: [name], MD  Subjective   Breathing continues to improve.  Still w/ some inc in abdominal girth over appetite improving and she was able to get up and sit in the chair today.  Inpatient Medications    Scheduled Meds: . carvedilol  9.375 mg Oral BID  . fluticasone  1 spray Each Nare Daily  . furosemide  60 mg Intravenous BID  . montelukast  10 mg Oral QHS  . predniSONE  10 mg Oral Daily  . sodium chloride flush  3 mL Intravenous Q12H  . tamsulosin  0.4 mg Oral QHS  . vitamin B-12  1,000 mcg Oral Daily  . Warfarin - Pharmacist Dosing Inpatient   Does not apply q1600   Continuous Infusions: . sodium chloride     PRN Meds: sodium chloride, acetaminophen, ALPRAZolam, ondansetron (ZOFRAN) IV, polyethylene glycol, sodium chloride flush   Vital Signs    Vitals:   06/20/20 0016 06/20/20 0527 06/20/20 0753 06/20/20 1013  BP: (!) 98/56 (!) 101/55 94/71 (!) 85/69  Pulse: 62 (!) 59 60 (!) 52  Resp: 17 18 18 16   Temp: 98.2 F (36.8 C) 98.5 F (36.9 C) 98 F (36.7 C) 97.8 F (36.6 C)  TempSrc: Oral   Axillary  SpO2: 96% 99% 98% 95%  Weight:      Height:        Intake/Output Summary (Last 24 hours) at 06/20/2020 1112 Last data filed at 06/20/2020 0536 Gross per 24 hour  Intake --  Output 2150 ml  Net -2150 ml   Filed Weights   06/17/20 1552 06/17/20 2259 06/19/20 0502  Weight: 66.2 kg 66.8 kg 66.9 kg    Physical Exam   GEN: Well nourished, well developed, in no acute distress.  HEENT: Grossly normal.  Neck: Supple, JVD to jaw.  No carotid bruits, or masses. Cardiac: RRR, no murmurs, rubs, or gallops. No clubbing, cyanosis, 1-2+ bilateral ankle edema.  Radials 2+, DP/PT 1+ and equal bilaterally.  Respiratory:  Respirations regular and unlabored, bibasilar crackles GI: Semifirm and protuberant, nontender, BS + x 4. MS: no deformity or atrophy. Skin: warm  and dry, no rash. Neuro:  Strength and sensation are intact. Psych: AAOx3.  Normal affect.  Labs    Chemistry Recent Labs  Lab 06/14/20 0946 06/17/20 1555 06/18/20 0542 06/19/20 0600  NA 133* 124* 130* 130*  K 4.9 5.6* 4.2 4.0  CL 95* 90* 95* 95*  CO2 28 26 27 26   GLUCOSE 116* 124* 85 88  BUN 40* 39* 40* 33*  CREATININE 1.76* 1.60* 1.57* 1.44*  CALCIUM 9.0 8.2* 8.3* 8.2*  PROT 6.3* 6.1*  --   --   ALBUMIN 3.6 3.6  --   --   AST 28 33  --   --   ALT 21 24  --   --   ALKPHOS 94 97  --   --   BILITOT 1.8* 1.5*  --   --   GFRNONAA 37* 42* 43* 47*  ANIONGAP 10 8 8 9      Hematology Recent Labs  Lab 06/14/20 0946 06/17/20 1555 06/18/20 0542  WBC 10.5 9.1 7.8  RBC 4.32 4.39 4.14*  HGB 12.9* 13.4 12.6*  HCT 39.0 39.2 36.6*  MCV 90.3 89.3 88.4  MCH 29.9 30.5 30.4  MCHC 33.1 34.2 34.4  RDW 16.2* 16.6* 16.5*  PLT 166 154 142*    Cardiac Enzymes  Recent  Labs  Lab 06/17/20 1555  TROPONINIHS 3      BNP Recent Labs  Lab 06/17/20 1555 06/19/20 0600  BNP 2,366.8* 2,034.7*      Lipids  Lab Results  Component Value Date   CHOL 183 11/06/2018   HDL 49.50 11/06/2018   LDLCALC 107 (H) 11/06/2018   TRIG 130.0 11/06/2018   CHOLHDL 4 11/06/2018    HbA1c  Lab Results  Component Value Date   HGBA1C 6.1 11/16/2019   Lab Results  Component Value Date   INR 2.7 (H) 06/20/2020   INR 2.0 (H) 06/19/2020   INR 1.7 (H) 06/18/2020    Radiology    CT ABDOMEN PELVIS WO CONTRAST  Result Date: 06/17/2020 CLINICAL DATA:  Bloated the generalized abdominal pain. EXAM: CT ABDOMEN AND PELVIS WITHOUT CONTRAST TECHNIQUE: Multidetector CT imaging of the abdomen and pelvis was performed following the standard protocol without IV contrast. COMPARISON:  June 14, 2020 FINDINGS: Lower chest: There is moderate severity cardiomegaly with a small pericardial effusion. Mild atelectasis and/or infiltrate is seen within the bilateral lung bases, right greater than left. Small to moderate  sized bilateral pleural effusions are noted. Hepatobiliary: No focal liver abnormality is seen. Status post cholecystectomy. No biliary dilatation. Pancreas: Unremarkable. No pancreatic ductal dilatation or surrounding inflammatory changes. Spleen: Normal in size without focal abnormality. Adrenals/Urinary Tract: Adrenal glands are unremarkable. Kidneys are normal, without renal calculi, focal lesion, or hydronephrosis. Mildly diluted contrast is seen within the lumen of an otherwise normal appearing urinary bladder. Stomach/Bowel: There is a small hiatal hernia. Appendix appears normal. No evidence of bowel dilatation. A large amount of stool is seen throughout the colon. Numerous noninflamed diverticula are seen throughout the large bowel. The mildly inflamed diverticulum seen within the sigmoid colon the prior study is no longer visualized. Vascular/Lymphatic: Aortic atherosclerosis. 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 No enlarged abdominal or pelvic lymph nodes. Reproductive: Prostate is unremarkable. A penile pump and associated reservoir are seen. Other: A 3.9 cm x 2.8 cm left inguinal hernia containing fluid and fat is seen. No abdominopelvic ascites. Musculoskeletal: Approximately 3 mm retrolisthesis of the L2 vertebral body is noted on L3. Multilevel degenerative changes seen throughout the lumbar spine. IMPRESSION: 1. Small to moderate sized bilateral pleural effusions, right greater than left. 2. Mild bibasilar atelectasis and/or infiltrate, right greater than left. 3. Moderate severity cardiomegaly with a small pericardial effusion. 4. Colonic diverticulosis. 5. Large stool burden without evidence of bowel obstruction. 6. Small hiatal hernia. 7. Approximately 3 mm retrolisthesis of the L2 vertebral body on L3 with multilevel degenerative changes throughout the lumbar spine. 8. Aortic atherosclerosis. Aortic Atherosclerosis (ICD10-I70.0). Electronically Signed   By:  Virgina Norfolk M.D.   On: 06/17/2020 16:03   DG Chest 2 View  Result Date: 06/17/2020 CLINICAL DATA:  Dyspnea.  Extremity swelling. EXAM: CHEST - 2 VIEW COMPARISON:  Most recent CT 11/04/2017. Lung bases from abdominal CT earlier today reviewed. FINDINGS: Multi lead left-sided pacemaker in place. The heart is enlarged. Stable mediastinal contours. Small bilateral pleural effusions. Mild vascular congestion without pulmonary edema. No confluent airspace disease. No pneumothorax. Right shoulder arthroplasty, alignment not well assessed on the current exam. There is a E minimally displaced distal right clavicle fracture, age indeterminate but new from 2019. Chronic superior subluxation of the humeral head. IMPRESSION: 1. Cardiomegaly with vascular congestion and small bilateral pleural effusions. Findings consistent with mild CHF. 2. Minimally displaced distal right clavicle fracture is age indeterminate but  new from 2019. Recommend correlation with history of interval injury and focal pain. Electronically Signed   By: Keith Rake M.D.   On: 06/17/2020 16:06    Telemetry    V paced, underlying a flutter - Personally Reviewed  Cardiac Studies   2D Echocardiogram 3.12.2022  Echocardiogram  1. Left ventricular ejection fraction, by estimation, is 20 to 25%. The  left ventricle has severely decreased function. The left ventricle  demonstrates global hypokinesis. The left ventricular internal cavity size  was mildly dilated. Left ventricular  diastolic parameters are indeterminate.   2. Right ventricular systolic function is normal. The right ventricular  size is normal. There is moderately elevated pulmonary artery systolic  pressure. The estimated right ventricular systolic pressure is 26.7 mmHg.   3. Left atrial size was severely dilated.   4. Right atrial size was moderately dilated.   5. The mitral valve is normal in structure. Mild mitral valve  regurgitation. No evidence of mitral  stenosis.   6. Tricuspid valve regurgitation is mild to moderate.   7. The inferior vena cava is dilated in size with <50% respiratory  variability, suggesting right atrial pressure of 15 mmHg.    Patient Profile     [name] is a 87 y.o. male with a hx of atrial flutter s/p DCCV and failed Tikosyn therapy on Coumadin, NICM / HFrEF (EF 20 to 25%, 2019) with prior LBBB and CHB s/p Medtronic BiV ICD s/p generator change out 04/2015, PHTN, hypertension, celiac aneurysm, COPD, GERD, BPH, IBS, and who is being seen today for the evaluation of AOC HFrEF.  Assessment & Plan    1.  Acute on chronic HFrEF/NICM:  EF 20-25% by echo this admission.  Minus 2.1l overnight and minus 2.2L since admission.  He says he was weighed this morning and is down to 142 pounds.  This is not recorded.  His dry weight is home is typically about 138 pounds.  Still with JVD, some increase in abdominal girth, bibasilar crackles, and lower extremity swelling.  Continue current dose of IV Lasix.  Follow-up basic metabolic panel today.  Hopefully can transition to oral torsemide tomorrow.  Patient previously on furosemide and says that he noticed waning response recently.  Cont  blocker.  BP remains soft w/ intermittent hypotension.  In that setting, no acei/arb/arni/mra.  2.  Permanent atrial flutter: S/p failed RFCA.  Predominantly V paced with underlying atrial flutter.  Rate controlled on beta-blocker therapy..  Anticoagulated w/ warfarin.  INR rx @ 2.7.   3.  CKD III: Creat has been stable.  Repeat pending this morning.  4.  Essential HTN:  As above, BPs soft on carvedilol.  5.  COPD: On chronic prednisone.  Per internal medicine.  Signed, Murray Hodgkins, NP  06/20/2020, 11:12 AM    For questions or updates, please contact   Please consult [email or web address] for contact info under Cardiology/STEMI.

## 2020-06-20 NOTE — Progress Notes (Signed)
Physical Therapy Treatment Patient Details Name: [name] MRN: [id number] DOB: 01-18-1932 Today's Date: 06/20/2020    History of Present Illness Pt is an 87 y/o M admitted on 06/17/20 with c/c of worsening SOB x 1 week. CT abdomen pelvis without contrast showed moderate-sized bilateral pleural effusion, R>L. PMH: HTN, anxiety, a-flutter, aortic valve regurgitation, BPH, chronic combined heart failure, L BBB, pulmonary HTN    PT Comments    Pt seen for PT treatment with family present for session & pt educating pt & family on NWB RUE in sling with pt stating "I'm not wearing that sling" and none present in room. PT educated pt on use of SPC during gait & pt able to ambulate increased distances with AD without LOB noted. Pt performs BLE strengthening exercises with cuing from PT. Pt would benefit from ongoing PT services to progress gait with LRAD, endurance, balance, and strengthening.     Follow Up Recommendations  Home health PT;Supervision for mobility/OOB     Equipment Recommendations  3in1 (PT)    Recommendations for Other Services       Precautions / Restrictions Precautions Precautions: Fall Required Braces or Orthoses: Sling (RUE) Restrictions Weight Bearing Restrictions: Yes RUE Weight Bearing: Non weight bearing (in sling)    Mobility  Bed Mobility               General bed mobility comments: not observed, pt recieved & left sitting in recliner    Transfers Overall transfer level: Needs assistance Equipment used: 1 person hand held assist Transfers: Sit to/from Stand Sit to Stand: Supervision         General transfer comment: does not maintain NWB RUE as pt pushes on armrests during sit>stand  Ambulation/Gait Ambulation/Gait assistance: Supervision Gait Distance (Feet): 200 Feet Assistive device: Straight cane Gait Pattern/deviations: Decreased step length - right;Decreased step length - left;Decreased stride length Gait velocity:  decreased       Stairs             Wheelchair Mobility    Modified Rankin (Stroke Patients Only)       Balance Overall balance assessment: Needs assistance;Mild deficits observed, not formally tested   Sitting balance-Leahy Scale: Good       Standing balance-Leahy Scale: Good                              Cognition Arousal/Alertness: Awake/alert Behavior During Therapy: WFL for tasks assessed/performed Overall Cognitive Status: Within Functional Limits for tasks assessed                                        Exercises General Exercises - Lower Extremity Long Arc Quad: AROM;Strengthening;Both;10 reps;Seated Hip ABduction/ADduction: AROM;Strengthening;Both;10 reps;Seated (hip adduction pillow squeezes) Hip Flexion/Marching: AROM;Strengthening;Both;10 reps;Seated    General Comments General comments (skin integrity, edema, etc.): HR 75 bpm during gait, SPO2 94% on room air      Pertinent Vitals/Pain Pain Assessment: No/denies pain    Home Living                      Prior Function            PT Goals (current goals can now be found in the care plan section) Acute Rehab PT Goals Patient Stated Goal: get better physically PT Goal Formulation: With patient Time For  Goal Achievement: 07/03/20 Potential to Achieve Goals: Good Progress towards PT goals: Progressing toward goals    Frequency    Min 2X/week      PT Plan Current plan remains appropriate    Co-evaluation              AM-PAC PT "6 Clicks" Mobility   Outcome Measure  Help needed turning from your back to your side while in a flat bed without using bedrails?: None Help needed moving from lying on your back to sitting on the side of a flat bed without using bedrails?: A Little Help needed moving to and from a bed to a chair (including a wheelchair)?: A Little Help needed standing up from a chair using your arms (e.g., wheelchair or bedside  chair)?: None Help needed to walk in hospital room?: A Little Help needed climbing 3-5 steps with a railing? : A Little 6 Click Score: 20    End of Session Equipment Utilized During Treatment: Gait belt Activity Tolerance: Patient tolerated treatment well Patient left: in chair;with family/visitor present (MD in room)   PT Visit Diagnosis: Unsteadiness on feet (R26.81);Muscle weakness (generalized) (M62.81)     Time: 4270-6237 PT Time Calculation (min) (ACUTE ONLY): 15 min  Charges:  $Therapeutic Activity: 8-22 mins                     Lavone Nian, PT, DPT 06/20/20, 9:59 AM    Waunita Schooner 06/20/2020, 9:58 AM

## 2020-06-21 ENCOUNTER — Inpatient Hospital Stay: Payer: Medicare HMO

## 2020-06-21 DIAGNOSIS — I5023 Acute on chronic systolic (congestive) heart failure: Secondary | ICD-10-CM | POA: Diagnosis not present

## 2020-06-21 DIAGNOSIS — M109 Gout, unspecified: Secondary | ICD-10-CM

## 2020-06-21 LAB — BASIC METABOLIC PANEL WITH GFR
Anion gap: 9 (ref 5–15)
BUN: 26 mg/dL — ABNORMAL HIGH (ref 8–23)
CO2: 28 mmol/L (ref 22–32)
Calcium: 8.4 mg/dL — ABNORMAL LOW (ref 8.9–10.3)
Chloride: 98 mmol/L (ref 98–111)
Creatinine, Ser: 1.39 mg/dL — ABNORMAL HIGH (ref 0.61–1.24)
GFR, Estimated: 49 mL/min — ABNORMAL LOW (ref >60)
Glucose, Bld: 84 mg/dL (ref 70–99)
Potassium: 3.5 mmol/L (ref 3.5–5.1)
Sodium: 135 mmol/L (ref 135–145)

## 2020-06-21 LAB — CBC
HCT: 38.4 % — ABNORMAL LOW (ref 39.0–52.0)
Hemoglobin: 12.7 g/dL — ABNORMAL LOW (ref 13.0–17.0)
MCH: 30.2 pg (ref 26.0–34.0)
MCHC: 33.1 g/dL (ref 30.0–36.0)
MCV: 91.4 fL (ref 80.0–100.0)
Platelets: 141 K/uL — ABNORMAL LOW (ref 150–400)
RBC: 4.2 MIL/uL — ABNORMAL LOW (ref 4.22–5.81)
RDW: 16.8 % — ABNORMAL HIGH (ref 11.5–15.5)
WBC: 8 K/uL (ref 4.0–10.5)
nRBC: 0 % (ref 0.0–0.2)

## 2020-06-21 LAB — PROTIME-INR
INR: 2.2 — ABNORMAL HIGH (ref 0.8–1.2)
Prothrombin Time: 23.9 seconds — ABNORMAL HIGH (ref 11.4–15.2)

## 2020-06-21 LAB — URIC ACID: Uric Acid, Serum: 12.8 mg/dL — ABNORMAL HIGH (ref 3.7–8.6)

## 2020-06-21 MED ORDER — HYDROCODONE-ACETAMINOPHEN 7.5-325 MG PO TABS
1.0000 | ORAL_TABLET | Freq: Once | ORAL | Status: AC
  Administered 2020-06-21: 1 via ORAL
  Filled 2020-06-21: qty 1

## 2020-06-21 MED ORDER — PREDNISONE 20 MG PO TABS
40.0000 mg | ORAL_TABLET | Freq: Every day | ORAL | Status: DC
Start: 2020-06-21 — End: 2020-06-23
  Administered 2020-06-21 – 2020-06-23 (×3): 40 mg via ORAL
  Filled 2020-06-21 (×3): qty 2

## 2020-06-21 MED ORDER — TORSEMIDE 20 MG PO TABS: 20.0000 mg | ORAL_TABLET | Freq: Two times a day (BID) | ORAL | 0 refills | Status: DC

## 2020-06-21 MED ORDER — WARFARIN SODIUM 5 MG PO TABS
5.0000 mg | ORAL_TABLET | Freq: Once | ORAL | Status: AC
  Administered 2020-06-21: 5 mg via ORAL
  Filled 2020-06-21: qty 1

## 2020-06-21 MED ORDER — ACETAMINOPHEN 500 MG PO TABS
500.0000 mg | ORAL_TABLET | ORAL | Status: DC | PRN
  Administered 2020-06-23: 500 mg via ORAL
  Filled 2020-06-21: qty 1

## 2020-06-21 MED ORDER — FUROSEMIDE 10 MG/ML IJ SOLN
80.0000 mg | Freq: Two times a day (BID) | INTRAMUSCULAR | Status: DC
  Administered 2020-06-21 – 2020-06-22 (×2): 80 mg via INTRAVENOUS
  Filled 2020-06-21 (×2): qty 8

## 2020-06-21 MED ORDER — PREDNISOLONE 5 MG PO TABS
40.0000 mg | ORAL_TABLET | Freq: Every day | ORAL | Status: DC
  Filled 2020-06-21: qty 8

## 2020-06-21 NOTE — Progress Notes (Signed)
Progress Note  Patient Name: [name] Date of Encounter: 06/21/2020  Primary Cardiologist: [name], MD  Subjective   C/o R foot pain this AM that started yesterday and worsened overnight.  Still w/ some DOE when out of bed.  Wt still 4 lbs above previous dry wt.  Inpatient Medications    Scheduled Meds: . carvedilol  9.375 mg Oral BID  . fluticasone  1 spray Each Nare Daily  . furosemide  60 mg Intravenous BID  . montelukast  10 mg Oral QHS  . predniSONE  40 mg Oral Q breakfast  . sodium chloride flush  3 mL Intravenous Q12H  . tamsulosin  0.4 mg Oral QHS  . vitamin B-12  1,000 mcg Oral Daily  . warfarin  5 mg Oral ONCE-1600  . Warfarin - Pharmacist Dosing Inpatient   Does not apply q1600   Continuous Infusions: . sodium chloride     PRN Meds: sodium chloride, acetaminophen, ALPRAZolam, ondansetron (ZOFRAN) IV, polyethylene glycol, sodium chloride flush   Vital Signs    Vitals:   06/20/20 1918 06/20/20 2038 06/21/20 0017 06/21/20 0404  BP: (!) 99/58 118/65 99/61 104/66  Pulse: 60 63 60 65  Resp: 20  16 18   Temp: 97.6 F (36.4 C)  98 F (36.7 C) 98.2 F (36.8 C)  TempSrc: Oral  Oral Oral  SpO2: 98%  100% 96%  Weight:      Height:        Intake/Output Summary (Last 24 hours) at 06/21/2020 1143 Last data filed at 06/21/2020 0945 Gross per 24 hour  Intake 1320 ml  Output 1450 ml  Net -130 ml   Filed Weights   06/17/20 1552 06/17/20 2259 06/19/20 0502  Weight: 66.2 kg 66.8 kg 66.9 kg    Physical Exam   GEN: Well nourished, well developed, in no acute distress.  HEENT: Grossly normal.  Neck: Supple, JVD to jaw, no carotid bruits, or masses. Cardiac: RRR, no murmurs, rubs, or gallops. No clubbing, cyanosis. Trace to 1+ bilat ankle edema.  Radials 2+, DP/PT 1+ and equal bilaterally.  Respiratory:  Respirations regular and unlabored, diminished breath sounds in bilat bases w/ basilar crackles. GI: Softer than yesterday, nontender,  nondistended, BS + x 4. MS: no deformity or atrophy. Skin: warm and dry, no rash. Neuro:  Strength and sensation are intact. Psych: AAOx3.  Normal affect.  Labs    Chemistry Recent Labs  Lab 06/17/20 1555 06/18/20 0542 06/19/20 0600 06/20/20 1141 06/21/20 0422  NA 124*   < > 130* 135 135  K 5.6*   < > 4.0 3.7 3.5  CL 90*   < > 95* 98 98  CO2 26   < > 26 28 28   GLUCOSE 124*   < > 88 99 84  BUN 39*   < > 33* 28* 26*  CREATININE 1.60*   < > 1.44* 1.38* 1.39*  CALCIUM 8.2*   < > 8.2* 8.3* 8.4*  PROT 6.1*  --   --   --   --   ALBUMIN 3.6  --   --   --   --   AST 33  --   --   --   --   ALT 24  --   --   --   --   ALKPHOS 97  --   --   --   --   BILITOT 1.5*  --   --   --   --  GFRNONAA 42*   < > 47* 50* 49*  ANIONGAP 8   < > 9 9 9    < > = values in this interval not displayed.     Hematology Recent Labs  Lab 06/17/20 1555 06/18/20 0542 06/21/20 0422  WBC 9.1 7.8 8.0  RBC 4.39 4.14* 4.20*  HGB 13.4 12.6* 12.7*  HCT 39.2 36.6* 38.4*  MCV 89.3 88.4 91.4  MCH 30.5 30.4 30.2  MCHC 34.2 34.4 33.1  RDW 16.6* 16.5* 16.8*  PLT 154 142* 141*    Cardiac Enzymes  Recent Labs  Lab 06/17/20 1555  TROPONINIHS 3      BNP Recent Labs  Lab 06/17/20 1555 06/19/20 0600  BNP 2,366.8* 2,034.7*    Lipids  Lab Results  Component Value Date   CHOL 183 11/06/2018   HDL 49.50 11/06/2018   LDLCALC 107 (H) 11/06/2018   TRIG 130.0 11/06/2018   CHOLHDL 4 11/06/2018    HbA1c  Lab Results  Component Value Date   HGBA1C 6.1 11/16/2019   Lab Results  Component Value Date   INR 2.2 (H) 06/21/2020   INR 2.7 (H) 06/20/2020   INR 2.0 (H) 06/19/2020    Radiology    CT ABDOMEN PELVIS WO CONTRAST  Result Date: 06/17/2020 CLINICAL DATA:  Bloated the generalized abdominal pain. EXAM: CT ABDOMEN AND PELVIS WITHOUT CONTRAST TECHNIQUE: Multidetector CT imaging of the abdomen and pelvis was performed following the standard protocol without IV contrast. COMPARISON:  June 14, 2020 FINDINGS: Lower chest: There is moderate severity cardiomegaly with a small pericardial effusion. Mild atelectasis and/or infiltrate is seen within the bilateral lung bases, right greater than left. Small to moderate sized bilateral pleural effusions are noted. Hepatobiliary: No focal liver abnormality is seen. Status post cholecystectomy. No biliary dilatation. Pancreas: Unremarkable. No pancreatic ductal dilatation or surrounding inflammatory changes. Spleen: Normal in size without focal abnormality. Adrenals/Urinary Tract: Adrenal glands are unremarkable. Kidneys are normal, without renal calculi, focal lesion, or hydronephrosis. Mildly diluted contrast is seen within the lumen of an otherwise normal appearing urinary bladder. Stomach/Bowel: There is a small hiatal hernia. Appendix appears normal. No evidence of bowel dilatation. A large amount of stool is seen throughout the colon. Numerous noninflamed diverticula are seen throughout the large bowel. The mildly inflamed diverticulum seen within the sigmoid colon the prior study is no longer visualized. Vascular/Lymphatic: Aortic atherosclerosis. 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 No enlarged abdominal or pelvic lymph nodes. Reproductive: Prostate is unremarkable. A penile pump and associated reservoir are seen. Other: A 3.9 cm x 2.8 cm left inguinal hernia containing fluid and fat is seen. No abdominopelvic ascites. Musculoskeletal: Approximately 3 mm retrolisthesis of the L2 vertebral body is noted on L3. Multilevel degenerative changes seen throughout the lumbar spine. IMPRESSION: 1. Small to moderate sized bilateral pleural effusions, right greater than left. 2. Mild bibasilar atelectasis and/or infiltrate, right greater than left. 3. Moderate severity cardiomegaly with a small pericardial effusion. 4. Colonic diverticulosis. 5. Large stool burden without evidence of bowel obstruction. 6. Small hiatal hernia. 7.  Approximately 3 mm retrolisthesis of the L2 vertebral body on L3 with multilevel degenerative changes throughout the lumbar spine. 8. Aortic atherosclerosis. Aortic Atherosclerosis (ICD10-I70.0). Electronically Signed   By: Virgina Norfolk M.D.   On: 06/17/2020 16:03   DG Chest 2 View  Result Date: 06/17/2020 CLINICAL DATA:  Dyspnea.  Extremity swelling. EXAM: CHEST - 2 VIEW COMPARISON:  Most recent CT 11/04/2017. Lung bases from abdominal  CT earlier today reviewed. FINDINGS: Multi lead left-sided pacemaker in place. The heart is enlarged. Stable mediastinal contours. Small bilateral pleural effusions. Mild vascular congestion without pulmonary edema. No confluent airspace disease. No pneumothorax. Right shoulder arthroplasty, alignment not well assessed on the current exam. There is a E minimally displaced distal right clavicle fracture, age indeterminate but new from 2019. Chronic superior subluxation of the humeral head. IMPRESSION: 1. Cardiomegaly with vascular congestion and small bilateral pleural effusions. Findings consistent with mild CHF. 2. Minimally displaced distal right clavicle fracture is age indeterminate but new from 2019. Recommend correlation with history of interval injury and focal pain. Electronically Signed   By: Keith Rake M.D.   On: 06/17/2020 16:06   DG Ankle 2 Views Right  Result Date: 06/21/2020 CLINICAL DATA:  Pain and swelling EXAM: RIGHT ANKLE - 2 VIEW COMPARISON:  None. FINDINGS: Frontal and lateral views were obtained. There is soft tissue swelling. No fracture or joint effusion. There is mild narrowing medially. No erosive change. Ankle mortise appears intact. There is extensive arterial vascular calcification. IMPRESSION: Soft tissue swelling. Mild osteoarthritic change. No fracture. Ankle mortise appears intact. Extensive atherosclerotic arterial vascular calcification noted. Electronically Signed   By: Lowella Grip III M.D.   On: 06/21/2020 09:45    Telemetry    V paced, underlying aflutter/fib, PVCs - Personally Reviewed  Cardiac Studies   2D Echocardiogram 3.12.2022  Echocardiogram 1. Left ventricular ejection fraction, by estimation, is 20 to 25%. The  left ventricle has severely decreased function. The left ventricle  demonstrates global hypokinesis. The left ventricular internal cavity size  was mildly dilated. Left ventricular  diastolic parameters are indeterminate.  2. Right ventricular systolic function is normal. The right ventricular  size is normal. There is moderately elevated pulmonary artery systolic  pressure. The estimated right ventricular systolic pressure is 27.5 mmHg.  3. Left atrial size was severely dilated.  4. Right atrial size was moderately dilated.  5. The mitral valve is normal in structure. Mild mitral valve  regurgitation. No evidence of mitral stenosis.  6. Tricuspid valve regurgitation is mild to moderate.  7. The inferior vena cava is dilated in size with <50% respiratory  variability, suggesting right atrial pressure of 15 mmHg.   Patient Profile     Bradly Sangiovanni Gerringeris a 87 y.o.malewith a hx of atrial flutter s/p DCCV and failed Tikosyn therapyon Coumadin,NICM/HFrEF (EF 20 to 25%, 2019)with prior LBBB and CHBs/pMedtronic BiVICD s/p generator change out 04/2015,PHTN,hypertension,celiac aneurysm, COPD, GERD, BPH, IBS, andwho is being seen today for the evaluation ofAOCHFrEF.  Assessment & Plan    1.  Acute on chronic HFrEF/NICM: EF 20-25% by echo this admission.  Minus 430 ml overnight and minus 2.4L for admission.  Wt still 142 lbs - four lbs above dry wt @ home (138 lbs).  Still w/ JVD, mild lower ext edema, and bibasilar crackles.  Will inc lasix to 80 IV this evening.  Can hopefully transition to oral torsemide tomorrow.  I suspect he'll need 20 BID.  Cont  blocker.  BP remains too soft to add acei/arb/arni/mra/sglt2i.  2.  Permanent atrial flutter:  S/p  failed RFCA.  Predominantly V paced w/ underlying aflutter.  Rate controlled on  blocker.  Cont warfain. INR rx @ 2.2.  3.  CKD III:  Creat stable w/ diuresis.  4.  Essential HTN:  BPs stable/soft on carvedilol.  5.  COPD:  Chronic prednisone per IM.  Signed, Murray Hodgkins, NP  06/21/2020, 11:43 AM  For questions or updates, please contact   Please consult [email or web address] for contact info under Cardiology/STEMI.

## 2020-06-21 NOTE — TOC Initial Note (Signed)
Transition of Care [facility]) - Initial/Assessment Note    Patient Details  Name: [name] MRN: [id number] Date of Birth: 1932-03-27  Transition of Care [facility]) CM/SW Contact:    Eileen Stanford, LCSW Phone Number: 06/21/2020, 12:05 PM  Clinical Narrative:     Pt's daughter and spouse present at bedside. Pt's family really wants pt to try [facility]. Pt lvies at home with spouse however the adult children live in the same neighborhood. There was no agency preference however, family would like a agency that accepts his insurance. Pt' family agreeable to 3n1. Referral made to teresa with [facility] and to [facility] with [employer].   Pt's children take him to doctor appointments. Pt still sees Dr. Danise Mina. Pt was not getting any services prior to admission. Pt has had hh in the past. [facility]. CSW is in the process of setting up [facility].  Pt has a scale at home and weighs himself daily. Pt has a established cardiologist.              Expected Discharge Plan: [place] Barriers to Discharge: Continued Medical Work up   Patient Goals and CMS Choice Patient states their goals for this hospitalization and ongoing recovery are:: to get home   Choice offered to / list presented to : Adult Children,Spouse,Patient  Expected Discharge Plan and Services Expected Discharge Plan: [place] In-house Referral: Clinical Social Work   Post Acute Care Choice: [place] arrangements for the past 2 months: [place]                 DME Arranged: 3-N-1 DME Agency: [employer] Date DME Agency Contacted: 06/21/20 Time DME Agency Contacted: 1159 Representative spoke with at DME Agency: patricia Ethridge: PT,OT          Prior Living Arrangements/Services Living arrangements for the past 2 months: [place] Lives with:: Spouse Patient language and need for interpreter reviewed:: Yes Do you feel safe going back to the place  where you live?: Yes      Need for Family Participation in Patient Care: Yes (Comment) Care giver support system in place?: Yes (comment)   Criminal Activity/Legal Involvement Pertinent to Current Situation/Hospitalization: No - Comment as needed  Activities of Daily Living Home Assistive Devices/Equipment: Cane (specify quad or straight),Walker (specify type) ADL Screening (condition at time of admission) Patient's cognitive ability adequate to safely complete daily activities?: Yes Is the patient deaf or have difficulty hearing?: Yes Does the patient have difficulty seeing, even when wearing glasses/contacts?: No Does the patient have difficulty concentrating, remembering, or making decisions?: No Patient able to express need for assistance with ADLs?: No Does the patient have difficulty dressing or bathing?: No Independently performs ADLs?: Yes (appropriate for developmental age) Does the patient have difficulty walking or climbing stairs?: Yes Weakness of Legs: Both Weakness of Arms/Hands: None  Permission Sought/Granted Permission sought to share information with : Family Supports    Share Information with NAME: peggy     Permission granted to share info w Relationship: spouse     Emotional Assessment Appearance:: Appears stated age Attitude/Demeanor/Rapport: Engaged Affect (typically observed): Accepting Orientation: : Oriented to Self,Oriented to Place,Oriented to  Time,Oriented to Situation Alcohol / Substance Use: Not Applicable Psych Involvement: No (comment)  Admission diagnosis:  Shortness of breath [R06.02] Hypervolemia, unspecified hypervolemia type [E87.70] SOB (shortness of breath) [R06.02] Patient Active Problem List   Diagnosis Date Noted  .  Acute on chronic systolic CHF (congestive heart failure) ([employer])   . SOB (shortness of breath) 06/18/2020  . Hypervolemia   . Shortness of breath 06/17/2020  . Chronic atrial fibrillation ([place]) 02/16/2020  .  Prediabetes 11/22/2019  . Pedal edema 11/16/2019  . Decreased visual acuity 11/16/2019  . Low back pain radiating to right lower extremity 07/09/2019  . Health maintenance examination 11/11/2018  . Chronic lung disease   . Nausea 11/04/2017  . Abnormal x-ray 10/28/2017  . Pulmonary HTN ([place]) 03/25/2017  . Aortic valve regurgitation 03/25/2017  . Other fatigue 12/04/2016  . Nondisplaced fracture of greater trochanter of left femur ([place]) 10/30/2016  . Status post reverse total arthroplasty of right shoulder 01/17/2016  . Complete tear of right rotator cuff 12/10/2015  . Acute sinusitis 03/18/2015  . Complete tear of left rotator cuff 05/07/2014  . Warfarin anticoagulation 05/07/2014  . Advanced care planning/counseling discussion 03/16/2014  . Constipation 09/09/2013  . Hyperglycemia 09/09/2013  . Medicare annual wellness visit, subsequent 04/22/2012  . 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 Left sided sciatica 06/11/2011  . Thrombocytopenia ([place]) 02/25/2011  . Vitamin B12 deficiency 02/22/2011  . Congestion of respiratory tract 09/25/2010  . Long term current use of anticoagulant 06/27/2010  . ESOPHAGEAL STRICTURE 02/16/2010  . CHEST PAIN 01/03/2010  . Atrial flutter ([place]) 12/20/2008  . Anxiety 12/08/2008  . Secondary cardiomyopathy ([place]) 09/13/2008  . LBBB 09/13/2008  . Automatic implantable cardioverter-defibrillator in situ 09/13/2008  . PORPHYRIA 12/16/2006  . Chronic systolic heart failure ([place]) 12/16/2006  . Allergic rhinitis 12/16/2006  . GERD 12/16/2006  . BENIGN PROSTATIC HYPERTROPHY 12/16/2006  . IBS 12/08/1997   PCP:  Ria Bush, MD Pharmacy:   [id number] [place] [place] - [address] [address] Phone: [phone] Fax: [place], [place] - [place] [address] [place] [address] Phone: [phone] Fax: [phone]     Social Determinants of Health (SDOH)  Interventions    Readmission Risk Interventions Readmission Risk Prevention Plan 06/21/2020  Transportation Screening Complete  PCP or Specialist Appt within 3-5 Days Complete  HRI or [place] Complete  Social Work Consult for [place] Planning/Counseling Complete  Palliative Care Screening Not Applicable  Medication Review Press photographer) Complete  Some recent data might be hidden

## 2020-06-21 NOTE — Progress Notes (Signed)
[place] [facility] [place] NAME: [name]    MR#:  [id number]  DATE OF BIRTH:  1931/08/28  SUBJECTIVE:  Acute right foot pain this a.m. with some swelling and redness. REVIEW OF SYSTEMS:   Review of Systems  Constitutional: Positive for malaise/fatigue. Negative for chills, fever and weight loss.  HENT: Negative for ear discharge, ear pain and nosebleeds.   Eyes: Negative for blurred vision, pain and discharge.  Respiratory: Positive for shortness of breath. Negative for sputum production, wheezing and stridor.   Cardiovascular: Negative for chest pain, palpitations, orthopnea and PND.  Gastrointestinal: Negative for abdominal pain, diarrhea, nausea and vomiting.  Genitourinary: Negative for frequency and urgency.  Musculoskeletal: Positive for joint pain. Negative for back pain.  Neurological: Positive for weakness. Negative for sensory change, speech change and focal weakness.  Psychiatric/Behavioral: Negative for depression and hallucinations. The patient is not nervous/anxious.    Tolerating Diet:yes Tolerating PT: HH PT  VITALS:  Blood pressure 104/66, pulse 65, temperature 98.2 F (36.8 C), temperature source Oral, resp. rate 18, height 5\' 4"  (1.626 m), weight 64.6 kg, SpO2 96 %.  PHYSICAL EXAMINATION:   Physical Exam  GENERAL:  87 y.o.-year-old patient lying in the bed with no acute distress.  LUNGS: decreased breath sounds bilaterally, no wheezing, rales, rhonchi. No use of accessory muscles of respiration.  CARDIOVASCULAR: S1, S2 normal. No murmurs, rubs, or gallops.  JVD present ABDOMEN: Soft, nontender, nondistended. Bowel sounds present. No organomegaly or mass.  EXTREMITIES: No cyanosis, clubbing, +edema b/l.  He does have tenderness, erythema and swelling of right dorsum of the foot NEUROLOGIC: Cranial nerves II through XII are intact. No focal Motor or sensory deficits b/l.   PSYCHIATRIC:  patient is alert and  oriented x 3.  SKIN: No obvious rash, lesion, or ulcer.   LABORATORY PANEL:  CBC Recent Labs  Lab 06/21/20 0422  WBC 8.0  HGB 12.7*  HCT 38.4*  PLT 141*    Chemistries  Recent Labs  Lab 06/17/20 1555 06/18/20 0542 06/21/20 0422  NA 124*   < > 135  K 5.6*   < > 3.5  CL 90*   < > 98  CO2 26   < > 28  GLUCOSE 124*   < > 84  BUN 39*   < > 26*  CREATININE 1.60*   < > 1.39*  CALCIUM 8.2*   < > 8.4*  AST 33  --   --   ALT 24  --   --   ALKPHOS 97  --   --   BILITOT 1.5*  --   --    < > = values in this interval not displayed.   Cardiac Enzymes No results for input(s): TROPONINI in the last 168 hours. RADIOLOGY:  DG Ankle 2 Views Right  Result Date: 06/21/2020 CLINICAL DATA:  Pain and swelling EXAM: RIGHT ANKLE - 2 VIEW COMPARISON:  None. FINDINGS: Frontal and lateral views were obtained. There is soft tissue swelling. No fracture or joint effusion. There is mild narrowing medially. No erosive change. Ankle mortise appears intact. There is extensive arterial vascular calcification. IMPRESSION: Soft tissue swelling. Mild osteoarthritic change. No fracture. Ankle mortise appears intact. Extensive atherosclerotic arterial vascular calcification noted. Electronically Signed   By: Lowella Grip III M.D.   On: 06/21/2020 09:45   ASSESSMENT AND PLAN:  [name] is a 87 y.o. male with medical history significant for hypertension, anxiety, atrial flutter, aortic valve  regurgitation, BPH, chronic combined heart failure, left bundle branch block, pulmonary hypertension, presented to the emergency department for chief concerns of shortness of breath. He reports worsening shortness of breath over 1 week from baseline. Pt says has gained 5-6 pounds in one week  Acute on chronic systolic heart failure (EF 20-25%, 2019) severe CMP Severe pulmonary hypertension Nonischemic cardiomyopathy s/p AICD --Increase Lasix to 80 mg IV twice daily per cardiology Net IO Since Admission:  -2,180 mL [06/21/20 1627] --Switch to oral torsemide tomorrow --Outpatient CHF/pulm clinic follow-up --echo 06/18/2020:EF 20-25% severe CMP  Acute gout flare In the right foot -likely due to diuretics Uric acid is 12.8 We will increase the dose of prednisone to 40 mg daily for now Foot x-rays negative for any acute pathology  Chronic cough with dyspnea --pt follows with Dr [name] --he has been on po prednisone 10 mg qd for >2 years.continue same --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   -- telemetry consistent with atrial flutter, which is likely exacerbating his volume overload as above.  S/p BiV ICD as above.  Failed Tikosyn therapy in the past -cont coreg --cont warfarin, INR 2.7--manage per Pharmacy  HTN --Current BP well controlled/soft.  Diverticulitis --Recent ED visit for diverticulitis.  --pt has not been taking his augmentin as prescribed and given lot of GI issues he does not want to cont with it. Denies any abd pain. --Tolerating po diet    Anxiety--prn xanax  Right shoulder ecchymosis s/p mech fall at home No fracture PT/OT recommends home health  BPH--cont flomax  Family communication :wife at bedside on 3/15 and are updated Consults :cardiology CODE STATUS: DNR (PTA) DVT Prophylaxis :warfarin Level of care: Progressive Cardiac Status is: Inpatient  Remains inpatient appropriate because:Inpatient level of care appropriate due to severity of illness   Dispo: The patient is from: Home              Anticipated d/c is to: Home with home health              Patient currently is not medically stable to d/c.   Difficult to place patient No  CHF--cont IV diuresis PT/OT  recommends home health Possible discharge in next 1 days depending on his clinical condition and progress.  For now requiring ongoing diuresis with good response     TOTAL TIME TAKING CARE OF THIS PATIENT: 25 minutes.  >50% time spent on counselling and coordination of care  Note: This dictation was prepared with Dragon dictation along with smaller phrase technology. Any transcriptional errors that result from this process are unintentional.  Max Sane M.D    [facility]   CC: Primary care physician; Ria Bush, MDPatient ID: Sherryle Lis, male   DOB: [birth year], 87 y.o.   MRN: [id number]

## 2020-06-21 NOTE — Progress Notes (Signed)
Mobility Specialist - Progress Note   06/21/20 1200  Mobility  Activity Ambulated in hall  Level of Assistance Standby assist, set-up cues, supervision of patient - no hands on  Assistive Device Cane  Distance Ambulated (ft) 150 ft  Mobility Response Tolerated well  Mobility performed by Mobility specialist  $Mobility charge 1 Mobility    Pt ambulated in hallway using [place] for NWB on RUE. Family at bedside. Pt with c/o gout on RLE that does limit ambulation this date. Mild LOB noted x1 and corrected by CGA follow. Did cue pt to hold SPC in L hand to follow weight-bearing protocols, pt carried over well. O2 maintained > 92% on room air.    Kathee Delton Mobility Specialist 06/21/20, 12:20 PM

## 2020-06-21 NOTE — Progress Notes (Signed)
[place] for Warfarin Indication: atrial fibrillation    Patient Measurements: Height: 5\' 4"  (162.6 cm) Weight: 66.9 kg (147 lb 8 oz) IBW/kg (Calculated) : 59.2  Vital Signs: Temp: 98.2 F (36.8 C) (03/15 0404) Temp Source: Oral (03/15 0404) BP: 104/66 (03/15 0404) Pulse Rate: 65 (03/15 0404)  Labs: Recent Labs    06/19/20 0600 06/20/20 0412 06/20/20 1141 06/21/20 0422  HGB  --   --   --  12.7*  HCT  --   --   --  38.4*  PLT  --   --   --  141*  LABPROT 22.2* 27.7*  --  23.9*  INR 2.0* 2.7*  --  2.2*  CREATININE 1.44*  --  1.38* 1.39*    Estimated Creatinine Clearance: 31.9 mL/min (A) (by C-G formula based on SCr of 1.39 mg/dL (H)).   Medical History: Past Medical History:  Diagnosis Date  . AICD (automatic cardioverter/defibrillator) present    s/p gen change 04/2015 w/ MDT [name] Crt-D  DTBA1D1, Serial number [id number]  . Allergic rhinitis    [name]  . Anemia   . Anxiety   . Arthritis   . Atrial flutter ([place])    A.  Status post cardioversion; B.  Tikosyn therapy - failed, remains in aflutter  . Bilateral pneumonia 11/02/2013   treated with levaquin  . BPH (benign prostatic hyperplasia)   . 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 Lucky Cowboy)  . Chronic lung disease    spirometry 2015 - no obstruction, + mild restrictive lung disease  . Chronic sinusitis   . Chronic systolic congestive heart failure ([place])   . Dyspnea    with exertion  . Extrinsic asthma    [name]  . Fall with injury 05/28/2017  . GERD (gastroesophageal reflux disease) 2010   h/o esophageal stricture with dilation, LA grade C reflux esophagitis by EGD 2010  . Goiter   . History of diverticulitis of colon 06/2020  . History of GI bleed    Secondary to hemorrhoids  . History of seasonal allergies   . Hypertension   . IBS (irritable bowel syndrome)   . LBBB (left bundle branch block)   . Multiple pulmonary nodules 06/2013   RUL  ([facility]) - ?vasculitis as of last CT at [facility]  . NICM (nonischemic cardiomyopathy) [facility])    Cardiac catheterization March 2006 without coronary disease; EF 25% 2018 Jan  . Orthopnea   . 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 Presence of permanent cardiac pacemaker   . Sinus congestion 09/25/2010    Medications:  Warfarin 2.5 mg on Mondays; 5mg  on all other days (TWD = 32.5 mg) *pt instructed to take 2.5mg  daily from 3/11-3/18/22 while on amoxicillin Pt reports last dose on 06/13/20 - 2.5mg    Assessment: 87yo male with history of congestive heart failure (EF 20-25%), A flutter, recently seen and started treatment outpatient for diverticulitis. Pt's INR from most recent admission 06/14/20 was 3.9, then 4.8 on 06/15/20; he did not receive warfarin during that admission. Pharmacy has been consulted for warfarin dosing and monitoring. Pt reports his last dose of warfarin was on 2.5mg  on 06/13/20. H&H and plt WNL.    Date INR Dose 3/7 -- 2.5 mg (per patient)  3/8 3.9 Held  (per patient)  3/9 4.8 Held  (per patient)  3/10 -- Held  (per patient)  3/11 1.9 2.5 mg 3/12 1.7 7.5  mg 3/13 2.0 5 mg 3/14 2.7 0 mg (hold x1) 3/15 2.2 5 mg   Goal of Therapy:  INR 2-3 Monitor platelets by anticoagulation protocol: Yes   Plan:   INR after holding x1; 2.7>2.2. INR therapeutic, not on interacting medications, 100% of meals now.  Will resume at 5mg  x1 tonight (home dose 2.5mg  [place] and 5mg  all other days.)  INR with morning labs   Lorna Dibble, PharmD Clinical Pharmacist  06/21/2020 8:24 AM

## 2020-06-21 NOTE — TOC Progression Note (Signed)
Transition of Care [facility]) - Progression Note    Patient Details  Name: LONDON TARNOWSKI MRN: [id number] Date of Birth: 05/28/1931  Transition of Care [facility]) CM/SW Contact  Eileen Stanford, LCSW Phone Number: 06/21/2020, 1:05 PM  Clinical Narrative:   [facility] will service pt.    Expected Discharge Plan: [place] Barriers to Discharge: Continued Medical Work up  Expected Discharge Plan and Services Expected Discharge Plan: [place] In-house Referral: Clinical Social Work   Post Acute Care Choice: [place] arrangements for the past 2 months: [place]                 DME Arranged: 3-N-1 DME Agency: [employer] Date DME Agency Contacted: 06/21/20 Time DME Agency Contacted: 1159 Representative spoke with at DME Agency: patricia Hamilton: PT,OT           Social Determinants of Health (SDOH) Interventions    Readmission Risk Interventions Readmission Risk Prevention Plan 06/21/2020  Transportation Screening Complete  PCP or Specialist Appt within 3-5 Days Complete  HRI or [place] Complete  Social Work Consult for [place] Planning/Counseling Complete  Palliative Care Screening Not Applicable  Medication Review Press photographer) Complete  Some recent data might be hidden

## 2020-06-21 NOTE — Care Management Important Message (Signed)
Important Message  Patient Details  Name: ZAKARIAH URWIN MRN: [id number] Date of Birth: 12-Oct-1931   Medicare Important Message Given:  Yes     Dannette Barbara 06/21/2020, 11:17 AM

## 2020-06-22 ENCOUNTER — Ambulatory Visit (INDEPENDENT_AMBULATORY_CARE_PROVIDER_SITE_OTHER): Payer: Medicare HMO

## 2020-06-22 DIAGNOSIS — M10271 Drug-induced gout, right ankle and foot: Secondary | ICD-10-CM

## 2020-06-22 DIAGNOSIS — I428 Other cardiomyopathies: Secondary | ICD-10-CM | POA: Diagnosis not present

## 2020-06-22 DIAGNOSIS — R0602 Shortness of breath: Secondary | ICD-10-CM

## 2020-06-22 LAB — CBC
HCT: 36.8 % — ABNORMAL LOW (ref 39.0–52.0)
Hemoglobin: 12 g/dL — ABNORMAL LOW (ref 13.0–17.0)
MCH: 29.8 pg (ref 26.0–34.0)
MCHC: 32.6 g/dL (ref 30.0–36.0)
MCV: 91.3 fL (ref 80.0–100.0)
Platelets: 146 10*3/uL — ABNORMAL LOW (ref 150–400)
RBC: 4.03 MIL/uL — ABNORMAL LOW (ref 4.22–5.81)
RDW: 16.7 % — ABNORMAL HIGH (ref 11.5–15.5)
WBC: 6.9 10*3/uL (ref 4.0–10.5)
nRBC: 0 % (ref 0.0–0.2)

## 2020-06-22 LAB — BASIC METABOLIC PANEL
Anion gap: 9 (ref 5–15)
BUN: 24 mg/dL — ABNORMAL HIGH (ref 8–23)
CO2: 29 mmol/L (ref 22–32)
Calcium: 8.6 mg/dL — ABNORMAL LOW (ref 8.9–10.3)
Chloride: 98 mmol/L (ref 98–111)
Creatinine, Ser: 1.33 mg/dL — ABNORMAL HIGH (ref 0.61–1.24)
GFR, Estimated: 52 mL/min — ABNORMAL LOW (ref >60)
Glucose, Bld: 124 mg/dL — ABNORMAL HIGH (ref 70–99)
Potassium: 4.2 mmol/L (ref 3.5–5.1)
Sodium: 136 mmol/L (ref 135–145)

## 2020-06-22 LAB — PROTIME-INR
INR: 2.2 — ABNORMAL HIGH (ref 0.8–1.2)
Prothrombin Time: 23.7 seconds — ABNORMAL HIGH (ref 11.4–15.2)

## 2020-06-22 MED ORDER — ALLOPURINOL 100 MG PO TABS
100.0000 mg | ORAL_TABLET | Freq: Every day | ORAL | Status: DC
Start: 2020-06-22 — End: 2020-06-23
  Administered 2020-06-22 – 2020-06-23 (×2): 100 mg via ORAL
  Filled 2020-06-22 (×2): qty 1

## 2020-06-22 MED ORDER — TORSEMIDE 20 MG PO TABS
40.0000 mg | ORAL_TABLET | Freq: Two times a day (BID) | ORAL | Status: DC
Start: 2020-06-22 — End: 2020-06-23
  Administered 2020-06-22 – 2020-06-23 (×2): 40 mg via ORAL
  Filled 2020-06-22 (×2): qty 2

## 2020-06-22 MED ORDER — WARFARIN SODIUM 5 MG PO TABS
5.0000 mg | ORAL_TABLET | Freq: Once | ORAL | Status: AC
  Administered 2020-06-22: 5 mg via ORAL
  Filled 2020-06-22: qty 1

## 2020-06-22 NOTE — Progress Notes (Signed)
Mobility Specialist - Progress Note   06/22/20 1400  Mobility  Activity Refused mobility  Mobility performed by Mobility specialist    Pt sleeping in bed upon arrival. Wife present at bedside requesting to let him rest at this time. Will attempt session at another date/time as appropriate.    Kathee Delton Mobility Specialist 06/22/20, 2:22 PM

## 2020-06-22 NOTE — Progress Notes (Signed)
   Heart Failure Nurse Navigator Note  HFrEF 25%.  Global hypokinesis.  Right ventricular systolic function is normal.  Moderately elevated pulmonary artery pressures.  Left atrium was severely dilated.  Right atrium is moderately dilated.  Mild mitral regurgitation.  Mild to moderate tricuspid regurgitation.  He presented from home due to worsening shortness of breath, weight gain of 5 to 6 pounds in 1 week and lower extremity edema and abdominal distention.  Comorbidities:  Permanent atrial flutter Chronic kidney disease stage III Hypertension COPD  He has a history of insertion of [employer] BiV ICD.  Medications:  Carvedilol 9.375 mg 2 times a day Warfarin  Patient to start torsemide 40 mg 2 times a day  Labs: Sodium 136, potassium 4.2, chloride 98, CO2 29, BUN 24, creatinine 1.33 Intake is 990 mL Output 500 mL Weight is 64.3 kg  Assessment  General-she is awake and alert wife and daughter at the bedside.  HEENT-patient wears glasses, no JVD  Cardiac-heart tones of regular rate and rhythm no murmurs rubs appreciated  Chest-breath sounds are clear.  Abdomen-soft nontender  Musculoskeletal-the tops of his feet remain edematous and pitting  Psych-patient is pleasant and appropriate makes good eye contact.  Neurologic-speech is clear he moves all extremities without difficulty.   With patient again today.  Discussed the his diuretic is being changed to torsemide.  Spoke with patient about his weights, he states that he weighs himself daily in the morning and in the evening.  Stressed that the weight to pay attention to his that first a.m. weight and if he notices a weight gain on that one he is to report to his physician.  He voices understanding.   Pricilla Riffle RN [facility]

## 2020-06-22 NOTE — Progress Notes (Signed)
[facility] [place] NAME: [name]    MR#:  [id number]  DATE OF BIRTH:  03-27-1932  SUBJECTIVE:  Sitting in bed, SOB improving, weight still above dry weight REVIEW OF SYSTEMS:   Review of Systems  Constitutional: Negative for chills, fever and weight loss.  HENT: Negative for ear discharge, ear pain and nosebleeds.   Eyes: Negative for blurred vision, pain and discharge.  Respiratory: Positive for shortness of breath. Negative for sputum production, wheezing and stridor.   Cardiovascular: Positive for leg swelling. Negative for chest pain, palpitations, orthopnea and PND.  Gastrointestinal: Negative for abdominal pain, diarrhea, nausea and vomiting.  Genitourinary: Negative for frequency and urgency.  Musculoskeletal: Positive for joint pain. Negative for back pain.  Neurological: Positive for weakness. Negative for sensory change, speech change and focal weakness.  Psychiatric/Behavioral: Negative for depression and hallucinations. The patient is not nervous/anxious.    Tolerating Diet:yes Tolerating PT: HH PT  VITALS:  Blood pressure 97/62, pulse 61, temperature 98.6 F (37 C), resp. rate 18, height 5\' 4"  (1.626 m), weight 64.3 kg, SpO2 100 %.  PHYSICAL EXAMINATION:   Physical Exam  GENERAL:  87 y.o.-year-old patient lying in the bed with no acute distress.  LUNGS: decreased breath sounds bilaterally, no wheezing, rales, rhonchi. No use of accessory muscles of respiration.  CARDIOVASCULAR: S1, S2 normal. No murmurs, rubs, or gallops.  JVD present ABDOMEN: Soft, nontender, nondistended. Bowel sounds present. No organomegaly or mass.  EXTREMITIES: No cyanosis, clubbing, +edema b/l.  He does have mild tenderness, lless erythema and swelling of right foot NEUROLOGIC: Cranial nerves II through XII are intact. No focal Motor or sensory deficits b/l.   PSYCHIATRIC:  patient is alert and oriented x 3.  SKIN: No obvious rash,  lesion, or ulcer.  LABORATORY PANEL:  CBC Recent Labs  Lab 06/22/20 0428  WBC 6.9  HGB 12.0*  HCT 36.8*  PLT 146*    Chemistries  Recent Labs  Lab 06/17/20 1555 06/18/20 0542 06/22/20 0428  NA 124*   < > 136  K 5.6*   < > 4.2  CL 90*   < > 98  CO2 26   < > 29  GLUCOSE 124*   < > 124*  BUN 39*   < > 24*  CREATININE 1.60*   < > 1.33*  CALCIUM 8.2*   < > 8.6*  AST 33  --   --   ALT 24  --   --   ALKPHOS 97  --   --   BILITOT 1.5*  --   --    < > = values in this interval not displayed.   Cardiac Enzymes No results for input(s): TROPONINI in the last 168 hours. RADIOLOGY:  DG Ankle 2 Views Right  Result Date: 06/21/2020 CLINICAL DATA:  Pain and swelling EXAM: RIGHT ANKLE - 2 VIEW COMPARISON:  None. FINDINGS: Frontal and lateral views were obtained. There is soft tissue swelling. No fracture or joint effusion. There is mild narrowing medially. No erosive change. Ankle mortise appears intact. There is extensive arterial vascular calcification. IMPRESSION: Soft tissue swelling. Mild osteoarthritic change. No fracture. Ankle mortise appears intact. Extensive atherosclerotic arterial vascular calcification noted. Electronically Signed   By: Lowella Grip III M.D.   On: 06/21/2020 09:45   ASSESSMENT AND PLAN:  [name] is a 87 y.o. male with medical history significant for hypertension, anxiety, atrial flutter, aortic valve regurgitation, BPH, chronic combined heart  failure, left bundle branch block, pulmonary hypertension, presented to the emergency department for chief concerns of shortness of breath. He reports worsening shortness of breath over 1 week from baseline. Pt says has gained 5-6 pounds in one week  Acute on chronic systolic heart failure (EF 20-25%, 2019) severe CMP Severe pulmonary hypertension Nonischemic cardiomyopathy s/p AICD --cardio switched Lasxix to Torsemide 40 mg po BID on 3/16 Net IO Since Admission: -1,810 mL [06/22/20  1552] --Outpatient CHF/pulm clinic follow-up --echo 06/18/2020:EF 20-25% severe CMP  Acute gout flare In the right foot -likely due to diuretics Uric acid is 12.8  prednisone to 40 mg once daily is helping. Start allopurinol on 3/16 Foot x-rays negative for any acute pathology D/w Dr Jefm Bryant. outpt f/up in his office in 2 weeks.  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  Diverticulitis --Recent ED visit for diverticulitis.  --pt has not been taking his augmentin as prescribed and given lot of GI issues he does not want to cont with it. Denies any abd pain. --Tolerating po diet    Anxiety--prn xanax  Right shoulder ecchymosis s/p mech fall at home No fracture PT/OT recommends home health  BPH--cont flomax  Family communication :wife & daughter at bedside on 3/16 and are updated Consults :cardiology CODE STATUS: DNR (PTA) DVT Prophylaxis :warfarin Level of care: Progressive Cardiac Status is: Inpatient  Remains inpatient appropriate because:Inpatient level of care appropriate due to severity of illness   Dispo: The patient is from: Home              Anticipated d/c is to: Home with home health              Patient currently is not medically stable to d/c.   Difficult to place patient No  CHF--cont IV diuresis PT/OT recommends home health Possible discharge in next 1-2 days depending on his clinical condition and progress.  For now requiring ongoing diuresis with good response      TOTAL TIME TAKING CARE OF THIS PATIENT: 25 minutes.  >50% time spent on counselling and coordination of care  Note: This dictation was prepared with Dragon dictation along with smaller phrase technology. Any transcriptional errors that result from this process are unintentional.  [name] M.D    [facility]   CC: Primary care physician; Ria Bush, MDPatient ID: Sherryle Lis, male   DOB: [birth year], 87 y.o.   MRN: [id number]

## 2020-06-22 NOTE — Progress Notes (Signed)
Occupational Therapy Treatment Patient Details Name: [name] MRN: [id number] DOB: March 22, 1932 Today's Date: 06/22/2020    History of present illness Pt is an 87 y/o M admitted on 06/17/20 with c/c of worsening SOB x 1 week. CT abdomen pelvis without contrast showed moderate-sized bilateral pleural effusion, R>L. PMH: HTN, anxiety, a-flutter, aortic valve regurgitation, BPH, chronic combined heart failure, L BBB, pulmonary HTN   OT comments  [name] was seen for OT treatment on this date. Upon arrival to room pt reclined in bed with wife and daughter in room. Pt requires MAX A don B socks reclined in bed - pt reports he makes his wife do it 2/2 R ankle pain. Pt refuses wearing sling for RUE NWBing pcns - family receptive to education. CGA + HHA for ADL t/f and ~100 ft mobility. Pt making good progress toward goals. Pt continues to benefit from skilled OT services to maximize return to PLOF and minimize risk of future falls, injury, caregiver burden, and readmission. Will continue to follow POC. Discharge recommendation remains appropriate.    Follow Up Recommendations  No OT follow up;Supervision - Intermittent    Equipment Recommendations  Other (comment) (SPC)    Recommendations for Other Services      Precautions / Restrictions Precautions Precautions: Fall Restrictions Weight Bearing Restrictions: Yes RUE Weight Bearing: Non weight bearing Other Position/Activity Restrictions: RUE NWBing in sling per MD, however pt declines to wear sling or maintain NWBing       Mobility Bed Mobility Overal bed mobility: Needs Assistance Bed Mobility: Supine to Sit;Sit to Supine     Supine to sit: Supervision Sit to supine: Supervision   General bed mobility comments: VCs to not use RUE - pt not receptive    Transfers Overall transfer level: Needs assistance Equipment used: None Transfers: Sit to/from Stand Sit to Stand: Supervision              Balance Overall  balance assessment: Needs assistance Sitting-balance support: No upper extremity supported;Feet supported Sitting balance-Leahy Scale: Good     Standing balance support: Single extremity supported Standing balance-Leahy Scale: Poor Standing balance comment: requires CGA 2/2 minor LOBs, unable to self correct or ID                           ADL either performed or assessed with clinical judgement   ADL Overall ADL's : Needs assistance/impaired                                       General ADL Comments: MAX A don B socks reclined in bed - pt reports he makes his wife do it 2/2 R ankle pain. Pt refuses wearing sling for RUE NWBing pcns - family receptive to education. CGA + HHA for ADL t/f               Cognition Arousal/Alertness: Awake/alert Behavior During Therapy: WFL for tasks assessed/performed Overall Cognitive Status: Within Functional Limits for tasks assessed                                 General Comments: pleasant and head strong - adamant that he is "almost 43, I'm going to do it this way"        Exercises Exercises: Other exercises Other Exercises Other Exercises:  Pt and family educated re: DME recs, d/c recs, ECS, HEP, edema mgmt Other Exercises: LBD, sup<.sit, sit<>stand, sitting/standing balance/tolerance, ~100 ft mobility           Pertinent Vitals/ Pain       Pain Assessment: Faces Faces Pain Scale: Hurts little more Pain Location: R ankle Pain Descriptors / Indicators: Dull;Discomfort Pain Intervention(s): Limited activity within patient's tolerance;Repositioned         Frequency  Min 1X/week        Progress Toward Goals  OT Goals(current goals can now be found in the care plan section)  Progress towards OT goals: Progressing toward goals  Acute Rehab OT Goals Patient Stated Goal: go home and get better OT Goal Formulation: With patient/family Time For Goal Achievement: 07/04/20 Potential to  Achieve Goals: Good ADL Goals Additional ADL Goal #1: Pt will perform morning ADL routine with remote supervision and VSS with PRN VC to implement learned ECS. Additional ADL Goal #2: Pt will verbalize plan to implement at least 2 learned ECS to support safety/independence with ADL/IADL .  Plan Discharge plan remains appropriate;Frequency remains appropriate       AM-PAC OT "6 Clicks" Daily Activity     Outcome Measure   Help from another person eating meals?: None Help from another person taking care of personal grooming?: A Little Help from another person toileting, which includes using toliet, bedpan, or urinal?: A Little Help from another person bathing (including washing, rinsing, drying)?: A Little Help from another person to put on and taking off regular upper body clothing?: A Little Help from another person to put on and taking off regular lower body clothing?: A Lot 6 Click Score: 18    End of Session    OT Visit Diagnosis: Other abnormalities of gait and mobility (R26.89);Muscle weakness (generalized) (M62.81)   Activity Tolerance Patient tolerated treatment well   Patient Left in bed;with call bell/phone within reach;with family/visitor present   Nurse Communication          Time: 6579-0383 OT Time Calculation (min): 13 min  Charges: OT General Charges $OT Visit: 1 Visit OT Treatments $Self Care/Home Management : 8-22 mins  [name], M.S. OTR/L  06/22/20, 2:37 PM  ascom [phone]

## 2020-06-22 NOTE — Plan of Care (Signed)
  Problem: Activity: °Goal: Capacity to carry out activities will improve °Outcome: Progressing °  °Problem: Cardiac: °Goal: Ability to achieve and maintain adequate cardiopulmonary perfusion will improve °Outcome: Progressing °  °Problem: Education: °Goal: Knowledge of General Education information will improve °Description: Including pain rating scale, medication(s)/side effects and non-pharmacologic comfort measures °Outcome: Progressing °  °Problem: Health Behavior/Discharge Planning: °Goal: Ability to manage health-related needs will improve °Outcome: Progressing °  °Problem: Clinical Measurements: °Goal: Ability to maintain clinical measurements within normal limits will improve °Outcome: Progressing °Goal: Will remain free from infection °Outcome: Progressing °Goal: Diagnostic test results will improve °Outcome: Progressing °Goal: Respiratory complications will improve °Outcome: Progressing °Goal: Cardiovascular complication will be avoided °Outcome: Progressing °  °Problem: Activity: °Goal: Risk for activity intolerance will decrease °Outcome: Progressing °  °Problem: Nutrition: °Goal: Adequate nutrition will be maintained °Outcome: Progressing °  °Problem: Coping: °Goal: Level of anxiety will decrease °Outcome: Progressing °  °Problem: Elimination: °Goal: Will not experience complications related to bowel motility °Outcome: Progressing °Goal: Will not experience complications related to urinary retention °Outcome: Progressing °  °Problem: Pain Managment: °Goal: General experience of comfort will improve °Outcome: Progressing °  °Problem: Safety: °Goal: Ability to remain free from injury will improve °Outcome: Progressing °  °Problem: Skin Integrity: °Goal: Risk for impaired skin integrity will decrease °Outcome: Progressing °  °

## 2020-06-22 NOTE — Progress Notes (Signed)
[facility] for Warfarin Indication: atrial fibrillation    Patient Measurements: Height: 5\' 4"  (162.6 cm) Weight: 64.3 kg (141 lb 12.8 oz) IBW/kg (Calculated) : 59.2  Vital Signs: Temp: 97.9 F (36.6 C) (03/16 0835) Temp Source: Oral (03/16 0835) BP: 97/64 (03/16 0835) Pulse Rate: 61 (03/16 0835)  Labs: Recent Labs    06/20/20 0412 06/20/20 1141 06/21/20 0422 06/22/20 0428  HGB  --   --  12.7* 12.0*  HCT  --   --  38.4* 36.8*  PLT  --   --  141* 146*  LABPROT 27.7*  --  23.9* 23.7*  INR 2.7*  --  2.2* 2.2*  CREATININE  --  1.38* 1.39* 1.33*    Estimated Creatinine Clearance: 33.4 mL/min (A) (by C-G formula based on SCr of 1.33 mg/dL (H)).   Medical History: Past Medical History:  Diagnosis Date  . AICD (automatic cardioverter/defibrillator) present    s/p gen change 04/2015 w/ MDT [name] Crt-D  DTBA1D1, Serial number [id number]  . Allergic rhinitis    [name]  . Anemia   . Anxiety   . Arthritis   . Atrial flutter ([place])    A.  Status post cardioversion; B.  Tikosyn therapy - failed, remains in aflutter  . Bilateral pneumonia 11/02/2013   treated with levaquin  . BPH (benign prostatic hyperplasia)   . 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 Lucky Cowboy)  . Chronic lung disease    spirometry 2015 - no obstruction, + mild restrictive lung disease  . Chronic sinusitis   . Chronic systolic congestive heart failure ([place])   . Dyspnea    with exertion  . Extrinsic asthma    [name]  . Fall with injury 05/28/2017  . GERD (gastroesophageal reflux disease) 2010   h/o esophageal stricture with dilation, LA grade C reflux esophagitis by EGD 2010  . Goiter   . History of diverticulitis of colon 06/2020  . History of GI bleed    Secondary to hemorrhoids  . History of seasonal allergies   . Hypertension   . IBS (irritable bowel syndrome)   . LBBB (left bundle branch block)   . Multiple pulmonary nodules 06/2013    RUL ([facility] Adam Phenix at [facility]) - ?vasculitis as of last CT at [facility]  . NICM (nonischemic cardiomyopathy) [facility])    Cardiac catheterization March 2006 without coronary disease; EF 25% 2018 Jan  . Orthopnea   . 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 Presence of permanent cardiac pacemaker   . Sinus congestion 09/25/2010    Medications:  Inpatient: APAP PRN (receiving <2g/day), Prednisone 10mg >40mg  QD  PTA: Warfarin 2.5 mg on Mondays; 5mg  on all other days (TWD = 32.5 mg) **pt instructed to take 2.5mg  daily from 3/11-3/18/22 while on amoxicillin Pt reports last dose on 06/13/20 - 2.5mg    Assessment: 87yo male with history of congestive heart failure (EF 20-25%), A flutter, recently seen and started treatment outpatient for diverticulitis. Pt's INR from most recent admission 06/14/20 was 3.9, then 4.8 on 06/15/20; he did not receive warfarin during that admission. Pharmacy has been consulted for warfarin dosing and monitoring. Pt reports his last dose of warfarin was on 2.5mg  on 06/13/20. H&H and plt WNL.    Date INR Dose 3/7 -- 2.5 mg (per patient)  3/8 3.9 Held  (per patient)  3/9 4.8 Held  (per patient)  3/10 -- Held  (per patient)  3/11 1.9 2.5  mg 3/12 1.7 7.5 mg 3/13 2.0 5 mg 3/14 2.7 0 mg (hold x1) 3/15 2.2 5 mg 3/16 2.2   Goal of Therapy:  INR 2-3 Monitor platelets by anticoagulation protocol: Yes   Plan:   INR plateaued 2.2>2.2 when resumed at 5mg  after one night hold. INR therapeutic, no significant interacting medications, 90-100% of meals.  Will continue at 5mg  x1 tonight (home dose 2.5mg  Mond and 5mg  all other days.)  INR with morning labs   Lorna Dibble, PharmD Clinical Pharmacist  06/22/2020 8:49 AM

## 2020-06-22 NOTE — Progress Notes (Signed)
Progress Note  Patient Name: [name] Date of Encounter: 06/22/2020  [facility] Cardiologist: [name], MD   Subjective   Shortness of breath much improved, has chronic swelling in the dorsal surface of his feet.  No ankle swelling.  Inpatient Medications    Scheduled Meds: . carvedilol  9.375 mg Oral BID  . fluticasone  1 spray Each Nare Daily  . furosemide  80 mg Intravenous BID  . montelukast  10 mg Oral QHS  . predniSONE  40 mg Oral Q breakfast  . sodium chloride flush  3 mL Intravenous Q12H  . tamsulosin  0.4 mg Oral QHS  . vitamin B-12  1,000 mcg Oral Daily  . warfarin  5 mg Oral ONCE-1600  . Warfarin - Pharmacist Dosing Inpatient   Does not apply q1600   Continuous Infusions: . sodium chloride     PRN Meds: sodium chloride, acetaminophen, ALPRAZolam, ondansetron (ZOFRAN) IV, polyethylene glycol, sodium chloride flush   Vital Signs    Vitals:   06/21/20 1932 06/22/20 0400 06/22/20 0500 06/22/20 0835  BP: 117/63 102/62  97/64  Pulse: (!) 59 (!) 58  61  Resp: 17     Temp: 98 F (36.7 C) 98.2 F (36.8 C)  97.9 F (36.6 C)  TempSrc: Oral Oral  Oral  SpO2: 98%   100%  Weight:   64.3 kg   Height:        Intake/Output Summary (Last 24 hours) at 06/22/2020 1041 Last data filed at 06/22/2020 1001 Gross per 24 hour  Intake 990 ml  Output 500 ml  Net 490 ml   Last 3 Weights 06/22/2020 06/21/2020 06/19/2020  Weight (lbs) 141 lb 12.8 oz 142 lb 8 oz 147 lb 8 oz  Weight (kg) 64.32 kg 64.638 kg 66.906 kg      Telemetry    Atrial flutter, V paced rhythm- Personally Reviewed  ECG    No new tracing observed- Personally Reviewed  Physical Exam   GEN: No acute distress.   Neck: No JVD Cardiac: RRR, no murmurs.  Respiratory:  Diminished breath sounds at bases, clear anteriorly GI: Soft, nontender, non-distended  MS:  edema on dorsal surface of feet, ankles and pretibia with no edema; No deformity. Neuro:  Nonfocal  Psych: Normal affect    Labs    High Sensitivity Troponin:   Recent Labs  Lab 06/17/20 1555  TROPONINIHS 3      Chemistry Recent Labs  Lab 06/17/20 1555 06/18/20 0542 06/20/20 1141 06/21/20 0422 06/22/20 0428  NA 124*   < > 135 135 136  K 5.6*   < > 3.7 3.5 4.2  CL 90*   < > 98 98 98  CO2 26   < > 28 28 29   GLUCOSE 124*   < > 99 84 124*  BUN 39*   < > 28* 26* 24*  CREATININE 1.60*   < > 1.38* 1.39* 1.33*  CALCIUM 8.2*   < > 8.3* 8.4* 8.6*  PROT 6.1*  --   --   --   --   ALBUMIN 3.6  --   --   --   --   AST 33  --   --   --   --   ALT 24  --   --   --   --   ALKPHOS 97  --   --   --   --   BILITOT 1.5*  --   --   --   --  GFRNONAA 42*   < > 50* 49* 52*  ANIONGAP 8   < > 9 9 9    < > = values in this interval not displayed.     Hematology Recent Labs  Lab 06/18/20 0542 06/21/20 0422 06/22/20 0428  WBC 7.8 8.0 6.9  RBC 4.14* 4.20* 4.03*  HGB 12.6* 12.7* 12.0*  HCT 36.6* 38.4* 36.8*  MCV 88.4 91.4 91.3  MCH 30.4 30.2 29.8  MCHC 34.4 33.1 32.6  RDW 16.5* 16.8* 16.7*  PLT 142* 141* 146*    BNP Recent Labs  Lab 06/17/20 1555 06/19/20 0600  BNP 2,366.8* 2,034.7*     DDimer No results for input(s): DDIMER in the last 168 hours.   Radiology    DG Ankle 2 Views Right  Result Date: 06/21/2020 CLINICAL DATA:  Pain and swelling EXAM: RIGHT ANKLE - 2 VIEW COMPARISON:  None. FINDINGS: Frontal and lateral views were obtained. There is soft tissue swelling. No fracture or joint effusion. There is mild narrowing medially. No erosive change. Ankle mortise appears intact. There is extensive arterial vascular calcification. IMPRESSION: Soft tissue swelling. Mild osteoarthritic change. No fracture. Ankle mortise appears intact. Extensive atherosclerotic arterial vascular calcification noted. Electronically Signed   By: Lowella Grip III M.D.   On: 06/21/2020 09:45    Cardiac Studies   Echo 06/18/2020 1. Left ventricular ejection fraction, by estimation, is 20 to 25%. The  left  ventricle has severely decreased function. The left ventricle  demonstrates global hypokinesis. The left ventricular internal cavity size  was mildly dilated. Left ventricular  diastolic parameters are indeterminate.  2. Right ventricular systolic function is normal. The right ventricular  size is normal. There is moderately elevated pulmonary artery systolic  pressure. The estimated right ventricular systolic pressure is 62.7 mmHg.  3. Left atrial size was severely dilated.  4. Right atrial size was moderately dilated.  5. The mitral valve is normal in structure. Mild mitral valve  regurgitation. No evidence of mitral stenosis.  6. Tricuspid valve regurgitation is mild to moderate.  7. The inferior vena cava is dilated in size with <50% respiratory  variability, suggesting right atrial pressure of 15 mmHg.  Patient Profile     87 y.o. male male with history of permanent atrial flutter on Coumadin, NICM EF 20 to 25%, BiV ICD, pulmonary hypertension who presents with worsening shortness of breath, found to have volume overload.  Assessment & Plan    1. NICM EF 20-25% s/p ICD -Shortness of breath improved/resolved -Creatinine stable at 1.3 -Net even fluid balance -Transition from IV to oral torsemide 40 mg twice daily for better absorption (was previously on oral Lasix at home) -Edema on dorsal surface of foot is chronic, varicose veins, likely has venous insufficiency.  Leg raise maneuvers, compression stockings advised. -Continue Coreg. -Low normal BPs, renal dysfunction preventing use of ACE/ARB/Arni.  2.  Permanent atrial flutter, -Heart rate controlled -Continue Coreg, warfarin.  Total encounter time 35 minutes  Greater than 50% was spent in counseling and coordination of care with the patient Hopefully patient can be discharged in a day or 2 if creatinine stays stable and he has net even or negative fluid balance.      Signed, Kate Sable, MD  06/22/2020, 10:41  AM

## 2020-06-23 ENCOUNTER — Telehealth: Payer: Self-pay

## 2020-06-23 DIAGNOSIS — R52 Pain, unspecified: Secondary | ICD-10-CM

## 2020-06-23 DIAGNOSIS — I482 Chronic atrial fibrillation, unspecified: Secondary | ICD-10-CM

## 2020-06-23 DIAGNOSIS — E8770 Fluid overload, unspecified: Secondary | ICD-10-CM

## 2020-06-23 LAB — CBC
HCT: 35.7 % — ABNORMAL LOW (ref 39.0–52.0)
Hemoglobin: 12 g/dL — ABNORMAL LOW (ref 13.0–17.0)
MCH: 30.5 pg (ref 26.0–34.0)
MCHC: 33.6 g/dL (ref 30.0–36.0)
MCV: 90.6 fL (ref 80.0–100.0)
Platelets: 146 10*3/uL — ABNORMAL LOW (ref 150–400)
RBC: 3.94 MIL/uL — ABNORMAL LOW (ref 4.22–5.81)
RDW: 16.8 % — ABNORMAL HIGH (ref 11.5–15.5)
WBC: 7.4 10*3/uL (ref 4.0–10.5)
nRBC: 0.3 % — ABNORMAL HIGH (ref 0.0–0.2)

## 2020-06-23 LAB — PROTIME-INR
INR: 3.5 — ABNORMAL HIGH (ref 0.8–1.2)
Prothrombin Time: 33.7 seconds — ABNORMAL HIGH (ref 11.4–15.2)

## 2020-06-23 LAB — BASIC METABOLIC PANEL
Anion gap: 11 (ref 5–15)
BUN: 28 mg/dL — ABNORMAL HIGH (ref 8–23)
CO2: 26 mmol/L (ref 22–32)
Calcium: 8.4 mg/dL — ABNORMAL LOW (ref 8.9–10.3)
Chloride: 98 mmol/L (ref 98–111)
Creatinine, Ser: 1.21 mg/dL (ref 0.61–1.24)
GFR, Estimated: 58 mL/min — ABNORMAL LOW (ref >60)
Glucose, Bld: 126 mg/dL — ABNORMAL HIGH (ref 70–99)
Potassium: 3.6 mmol/L (ref 3.5–5.1)
Sodium: 135 mmol/L (ref 135–145)

## 2020-06-23 MED ORDER — PREDNISONE 10 MG (21) PO TBPK: ORAL_TABLET | ORAL | 0 refills | Status: DC

## 2020-06-23 MED ORDER — ALLOPURINOL 100 MG PO TABS: 100.0000 mg | ORAL_TABLET | Freq: Every day | ORAL | 0 refills | Status: DC

## 2020-06-23 MED ORDER — TORSEMIDE 40 MG PO TABS: 40.0000 mg | ORAL_TABLET | Freq: Two times a day (BID) | ORAL | 0 refills | Status: DC

## 2020-06-23 NOTE — Telephone Encounter (Signed)
Transition Care Management Follow-up Telephone Call  Date of discharge and from where: 06/23/2020, [facility]  How have you been since you were released from the hospital? Spoke with [name] (wife and HIPAA verified). Patient is resting and is stable.  Any questions or concerns? No  Items Reviewed:  Did the pt receive and understand the discharge instructions provided? Yes   Medications obtained and verified? Yes   Other? No   Any new allergies since your discharge? No   Dietary orders reviewed? Yes  Do you have support at home? Yes   Home Care and Equipment/Supplies: Were home health services ordered? Yes If so, what is the name of the agency? [facility] Has the agency set up a time to come to the patient's home? no Were any new equipment or medical supplies ordered?  No What is the name of the medical supply agency? N/A Were you able to get the supplies/equipment? not applicable Do you have any questions related to the use of the equipment or supplies? No  Functional Questionnaire: (I = Independent and D = Dependent) ADLs: I  Bathing/Dressing- I  Meal Prep- I  Eating- I  Maintaining continence- I  Transferring/Ambulation- I  Managing Meds- I  Follow up appointments reviewed:   PCP Hospital f/u appt confirmed? Yes  Scheduled to see Dr. Danise Mina on 07/01/2020 @ 11 am.  [facility] f/u appt confirmed? Yes  Scheduled to see cardiology, rheumatology   Are transportation arrangements needed? No   If their condition worsens, is the pt aware to call PCP or go to the Emergency Dept.? Yes  Was the patient provided with contact information for the PCP's office or ED? Yes  Was to pt encouraged to call back with questions or concerns? Yes

## 2020-06-23 NOTE — Progress Notes (Signed)
[name] for Warfarin Indication: atrial fibrillation    Patient Measurements: Height: 5\' 4"  (162.6 cm) Weight: 64.3 kg (141 lb 12.8 oz) IBW/kg (Calculated) : 59.2  Vital Signs: Temp: 97.8 F (36.6 C) (03/17 0721) Temp Source: Oral (03/17 0721) BP: 107/69 (03/17 0721) Pulse Rate: 62 (03/17 0721)  Labs: Recent Labs    06/21/20 0422 06/22/20 0428 06/23/20 0420  HGB 12.7* 12.0* 12.0*  HCT 38.4* 36.8* 35.7*  PLT 141* 146* 146*  LABPROT 23.9* 23.7* 33.7*  INR 2.2* 2.2* 3.5*  CREATININE 1.39* 1.33* 1.21    Estimated Creatinine Clearance: 36.7 mL/min (by C-G formula based on SCr of 1.21 mg/dL).   Medical History: Past Medical History:  Diagnosis Date  . AICD (automatic cardioverter/defibrillator) present    s/p gen change 04/2015 w/ MDT [name] Crt-D  DTBA1D1, Serial number [id number]  . Allergic rhinitis    [name]  . Anemia   . Anxiety   . Arthritis   . Atrial flutter ([place])    A.  Status post cardioversion; B.  Tikosyn therapy - failed, remains in aflutter  . Bilateral pneumonia 11/02/2013   treated with levaquin  . BPH (benign prostatic hyperplasia)   . 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 Lucky Cowboy)  . Chronic lung disease    spirometry 2015 - no obstruction, + mild restrictive lung disease  . Chronic sinusitis   . Chronic systolic congestive heart failure ([place])   . Dyspnea    with exertion  . Extrinsic asthma    [name]  . Fall with injury 05/28/2017  . GERD (gastroesophageal reflux disease) 2010   h/o esophageal stricture with dilation, LA grade C reflux esophagitis by EGD 2010  . Goiter   . History of diverticulitis of colon 06/2020  . History of GI bleed    Secondary to hemorrhoids  . History of seasonal allergies   . Hypertension   . IBS (irritable bowel syndrome)   . LBBB (left bundle branch block)   . Multiple pulmonary nodules 06/2013   RUL ([facility] Adam Phenix [facility]) -  ?vasculitis as of last CT at [facility]  . NICM (nonischemic cardiomyopathy) [facility])    Cardiac catheterization March 2006 without coronary disease; EF 25% 2018 Jan  . Orthopnea   . 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 Presence of permanent cardiac pacemaker   . Sinus congestion 09/25/2010    Medications:  Inpatient: APAP PRN (receiving <2g/day), Prednisone 10mg >40mg  QD  PTA: Warfarin 2.5 mg on Mondays; 5mg  on all other days (TWD = 32.5 mg) **pt instructed to take 2.5mg  daily from 3/11-3/18/22 while on amoxicillin Pt reports last dose on 06/13/20 - 2.5mg    Assessment: 87yo male with history of congestive heart failure (EF 20-25%), A flutter, recently seen and started treatment outpatient for diverticulitis. Pt's INR from most recent admission 06/14/20 was 3.9, then 4.8 on 06/15/20; he did not receive warfarin during that admission. Pharmacy has been consulted for warfarin dosing and monitoring. Pt reports his last dose of warfarin was on 2.5mg  on 06/13/20. H&H and plt WNL.    Date INR Dose 3/7 -- 2.5 mg (per patient)  3/8 3.9 Held  (per patient)  3/9 4.8 Held  (per patient)  3/10 -- Held  (per patient)  3/11 1.9 2.5 mg 3/12 1.7 7.5 mg 3/13 2.0 5 mg 3/14 2.7 0 mg (hold x1) 3/15 2.2 5 mg 3/16 2.2 5 mg 3/17 3.5 0 mg (hold  x1)  Goal of Therapy:  INR 2-3 Monitor platelets by anticoagulation protocol: Yes   Plan:   INR 2.2>3.5 after two nights of home 5mg  dose. Will hold x1 tonight; plan to restart at reduced 2.5mg  dose based on next INR and new torsemide.   Started torsemide last night (extensive hep metabolism and competitive 2C9 substrate can req 10-20% VKA dose reduction), 90-100% of meals.  (home dose 2.5mg  [place] and 5mg  all other days.)  INR with morning labs   Lorna Dibble, PharmD Clinical Pharmacist  06/23/2020 8:45 AM

## 2020-06-23 NOTE — Discharge Instructions (Signed)
Heart Failure, Self-Care Heart failure is a serious condition. The following information explains things you need to do to take care of yourself at home. To help you stay as healthy as possible, you may be asked to change your diet, take certain medicines, and make other changes in your life. Your doctor may also give you more specific instructions. If you have problems or questions, call your doctor. What are the risks? Having heart failure makes it more likely for you to have some problems. These problems can get worse if you do not take good care of yourself. Problems may include:  Damage to the kidneys, liver, or lungs.  Malnutrition.  Abnormal heart rhythms.  Blood clotting problems that could cause a stroke. Supplies needed:  Scale for weighing yourself.  Blood pressure monitor.  Notebook.  Medicines. How to care for yourself when you have heart failure Medicines Take over-the-counter and prescription medicines only as told by your doctor. Take your medicines every day.  Do not stop taking your medicine unless your doctor tells you to do so.  Do not skip any medicines.  Get your prescriptions refilled before you run out of medicine. This is important.  Talk with your doctor if you cannot afford your medicines. Eating and drinking  Eat heart-healthy foods. Talk with a diet specialist (dietitian) to create an eating plan.  Limit salt (sodium) if told by your doctor. Ask your diet specialist to tell you which seasonings are healthy for your heart.  Cook in healthy ways instead of frying. Healthy ways of cooking include roasting, grilling, broiling, baking, poaching, steaming, and stir-frying.  Choose foods that: ? Have no trans fat. ? Are low in saturated fat and cholesterol.  Choose healthy foods, such as: ? Fresh or frozen fruits and vegetables. ? Fish. ? Low-fat (lean) meats. ? Legumes, such as beans, peas, and lentils. ? Fat-free or low-fat dairy  products. ? Whole-grain foods. ? High-fiber foods.  Limit how much fluid you drink, if told by your doctor.   Alcohol use  Do not drink alcohol if: ? Your doctor tells you not to drink. ? Your heart was damaged by alcohol, or you have very bad heart failure. ? You are pregnant, may be pregnant, or are planning to become pregnant.  If you drink alcohol: ? Limit how much you have to:  0-1 drink a day for women.  0-2 drinks a day for men. ? Know how much alcohol is in your drink. In the [place] one drink equals one 12 oz bottle of beer (355 mL), one 5 oz glass of wine (148 mL), or one 1 oz glass of hard liquor (44 mL). Lifestyle  Do not smoke or use any products that contain nicotine or tobacco. If you need help quitting, ask your doctor. ? Do not use nicotine gum or patches before talking to your doctor.  Do not use illegal drugs.  Lose weight if told by your doctor.  Do physical activity if told by your doctor. Talk to your doctor before you begin an exercise if: ? You are an older adult. ? You have very bad heart failure.  Learn to manage stress. If you need help, ask your doctor.  Get physical rehab (rehabilitation) to help you stay independent and to help with your quality of life.  Participate in a cardiac rehab program. This program helps you improve your health through exercise, education, and counseling.  Plan time to rest when you get tired.   Check  weight and blood pressure  Weigh yourself every day. This will help you to know if fluid is building up in your body. ? Weigh yourself every morning after you pee (urinate) and before you eat breakfast. ? Wear the same amount of clothing each time. ? Write down your daily weight. Give your record to your doctor.  Check and write down your blood pressure as told by your doctor.  Check your pulse as told by your doctor.   Dealing with very hot and very cold weather  If it is very hot: ? Avoid activities that take a  lot of energy. ? Use air conditioning or fans, or find a cooler place. ? Avoid caffeine and alcohol. ? Wear clothing that is loose-fitting, lightweight, and light-colored.  If it is very cold: ? Avoid activities that take a lot of energy. ? Layer your clothes. ? Wear mittens or gloves, a hat, and a face covering when you go outside. ? Avoid alcohol. Follow these instructions at home:  Stay up to date with shots (vaccines). Get pneumococcal and flu (influenza) shots.  Keep all follow-up visits. Contact a doctor if:  You gain 2-3 lb (1-1.4 kg) in 24 hours or 5 lb (2.3 kg) in a week.  You have increasing shortness of breath.  You cannot do your normal activities.  You get tired easily.  You cough a lot.  You do not feel like eating or feel like you may vomit (nauseous).  You have swelling in your hands, feet, ankles, or belly (abdomen).  You cannot sleep well because it is hard to breathe.  You feel like your heart is beating fast (palpitations).  You get dizzy when you stand up.  You feel depressed or sad. Get help right away if:  You have trouble breathing.  You or someone else notices a change in your behavior, such as having trouble staying awake.  You have chest pain or discomfort.  You pass out (faint). These symptoms may be an emergency. Get help right away. Call your local emergency services (911 in the [place]  Do not wait to see if the symptoms will go away.  Do not drive yourself to the hospital. Summary  Heart failure is a serious condition. To care for yourself, you may have to change your diet, take medicines, and make other lifestyle changes.  Take your medicines every day. Do not stop taking them unless your doctor tells you to do so.  Limit salt and eat heart-healthy foods.  Ask your doctor if you can drink alcohol. You may have to stop alcohol use if you have very bad heart failure.  Contact your doctor if you gain weight quickly or feel  that your heart is beating too fast. Get help right away if you pass out or have chest pain or trouble breathing. This information is not intended to replace advice given to you by your health care provider. Make sure you discuss any questions you have with your health care provider. Document Revised: 10/17/2019 Document Reviewed: 10/17/2019 Elsevier Patient Education  2021 [place].   Heart Failure, Diagnosis  Heart failure is a condition in which the heart has trouble pumping blood. This may mean that the heart cannot pump enough blood out to the body or that the heart does not fill up with enough blood. For some people with heart failure, fluid may back up into the lungs. There may also be swelling (edema) in the lower legs. Heart failure is usually a  long-term (chronic) condition. It is important for you to take good care of yourself and follow the treatment plan from your health care provider. What are the causes? This condition may be caused by:  High blood pressure (hypertension). Hypertension causes the heart muscle to work harder than normal.  Coronary artery disease, or CAD. CAD is the buildup of cholesterol and fat (plaque) in the arteries of the heart.  Heart attack, also called myocardial infarction. This injures the heart muscle, making it hard for the heart to pump blood.  Abnormal heart valves. The valves do not open and close properly, forcing the heart to pump harder to keep the blood flowing.  Heart muscle disease, inflammation, or infection (cardiomyopathy or myocarditis). This is damage to the heart muscle. It can increase the risk of heart failure.  Lung disease. The heart works harder when the lungs are not healthy. What increases the risk? The risk of heart failure increases as a person ages. This condition is also more likely to develop in people who:  Are obese.  Are male.  Use tobacco or nicotine products.  Abuse alcohol or drugs.  Have taken  medicines that can damage the heart, such as chemotherapy drugs.  Have any of these conditions: ? Diabetes. ? Abnormal heart rhythms. ? Thyroid problems. ? Low blood counts (anemia). ? Chronic kidney disease.  Have a family history of heart failure. What are the signs or symptoms? Symptoms of this condition include:  Shortness of breath with activity, such as when climbing stairs.  A cough that does not go away.  Swelling of the feet, ankles, legs, or abdomen.  Losing or gaining weight for no reason.  Trouble breathing when lying flat.  Waking from sleep because of the need to sit up and get more air.  Rapid heartbeat.  Tiredness (fatigue) and loss of energy.  Feeling light-headed, dizzy, or close to fainting.  Nausea or loss of appetite.  Waking up more often during the night to urinate (nocturia).  Confusion. How is this diagnosed? This condition is diagnosed based on:  Your medical history, symptoms, and a physical exam.  Diagnostic tests, which may include: ? Echocardiogram. ? Electrocardiogram (ECG). ? Chest X-ray. ? Blood tests. ? Exercise stress test. ? Cardiac MRI. ? Cardiac catheterization and angiogram. ? Radionuclide scans. How is this treated? Treatment for this condition is aimed at managing the symptoms of heart failure. Medicines Treatment may include medicines that:  Help lower blood pressure by relaxing (dilating) the blood vessels. These medicines are called ACE inhibitors (angiotensin-converting enzyme), ARBs (angiotensin receptor blockers), or vasodilators.  Cause the kidneys to remove salt and water from the blood through urination (diuretics).  Improve heart muscle strength and prevent the heart from beating too fast (beta blockers).  Increase the force of the heartbeat (digoxin).  Lower heart rates. Certain diabetes medicines (SGLT-2 inhibitors) may also be used in treatment. Healthy behavior changes Treatment may also include  making healthy lifestyle changes, such as:  Reaching and staying at a healthy weight.  Not using tobacco or nicotine products.  Eating heart-healthy foods.  Limiting or avoiding alcohol.  Stopping the use of illegal drugs.  Being physically active.  Participating in a cardiac rehabilitation program, which is a treatment program to improve your health and well-being through exercise training, education, and counseling. Other treatments Other treatments may include:  Procedures to open blocked arteries or repair damaged valves.  Placing a pacemaker to improve heart function (cardiac resynchronization therapy).  Placing a  device to treat serious abnormal heart rhythms (implantable cardioverter defibrillator, or ICD).  Placing a device to improve the pumping ability of the heart (left ventricular assist device, or LVAD).  Receiving a healthy heart from a donor (heart transplant). This is done when other treatments have not helped. Follow these instructions at home:  Manage other health conditions as told by your health care provider. These may include hypertension, diabetes, thyroid disease, or abnormal heart rhythms.  Get ongoing education and support as needed. Learn as much as you can about heart failure.  Keep all follow-up visits. This is important. Summary  Heart failure is a condition in which the heart has trouble pumping blood.  This condition is commonly caused by high blood pressure and other diseases of the heart and lungs.  Symptoms of this condition include shortness of breath, tiredness (fatigue), nausea, and swelling of the feet, ankles, legs, or abdomen.  Treatments for this condition may include medicines, lifestyle changes, and surgery.  Manage other health conditions as told by your health care provider. This information is not intended to replace advice given to you by your health care provider. Make sure you discuss any questions you have with your  health care provider. Document Revised: 10/17/2019 Document Reviewed: 10/17/2019 Elsevier Patient Education  2021 [place].   Heart Failure, Self-Care Heart failure is a serious condition. The following information explains things you need to do to take care of yourself at home. To help you stay as healthy as possible, you may be asked to change your diet, take certain medicines, and make other changes in your life. Your doctor may also give you more specific instructions. If you have problems or questions, call your doctor. What are the risks? Having heart failure makes it more likely for you to have some problems. These problems can get worse if you do not take good care of yourself. Problems may include:  Damage to the kidneys, liver, or lungs.  Malnutrition.  Abnormal heart rhythms.  Blood clotting problems that could cause a stroke. Supplies needed:  Scale for weighing yourself.  Blood pressure monitor.  Notebook.  Medicines. How to care for yourself when you have heart failure Medicines Take over-the-counter and prescription medicines only as told by your doctor. Take your medicines every day.  Do not stop taking your medicine unless your doctor tells you to do so.  Do not skip any medicines.  Get your prescriptions refilled before you run out of medicine. This is important.  Talk with your doctor if you cannot afford your medicines. Eating and drinking  Eat heart-healthy foods. Talk with a diet specialist (dietitian) to create an eating plan.  Limit salt (sodium) if told by your doctor. Ask your diet specialist to tell you which seasonings are healthy for your heart.  Cook in healthy ways instead of frying. Healthy ways of cooking include roasting, grilling, broiling, baking, poaching, steaming, and stir-frying.  Choose foods that: ? Have no trans fat. ? Are low in saturated fat and cholesterol.  Choose healthy foods, such as: ? Fresh or frozen fruits and  vegetables. ? Fish. ? Low-fat (lean) meats. ? Legumes, such as beans, peas, and lentils. ? Fat-free or low-fat dairy products. ? Whole-grain foods. ? High-fiber foods.  Limit how much fluid you drink, if told by your doctor.   Alcohol use  Do not drink alcohol if: ? Your doctor tells you not to drink. ? Your heart was damaged by alcohol, or you have very bad  heart failure. ? You are pregnant, may be pregnant, or are planning to become pregnant.  If you drink alcohol: ? Limit how much you have to:  0-1 drink a day for women.  0-2 drinks a day for men. ? Know how much alcohol is in your drink. In the [place] one drink equals one 12 oz bottle of beer (355 mL), one 5 oz glass of wine (148 mL), or one 1 oz glass of hard liquor (44 mL). Lifestyle  Do not smoke or use any products that contain nicotine or tobacco. If you need help quitting, ask your doctor. ? Do not use nicotine gum or patches before talking to your doctor.  Do not use illegal drugs.  Lose weight if told by your doctor.  Do physical activity if told by your doctor. Talk to your doctor before you begin an exercise if: ? You are an older adult. ? You have very bad heart failure.  Learn to manage stress. If you need help, ask your doctor.  Get physical rehab (rehabilitation) to help you stay independent and to help with your quality of life.  Participate in a cardiac rehab program. This program helps you improve your health through exercise, education, and counseling.  Plan time to rest when you get tired.   Check weight and blood pressure  Weigh yourself every day. This will help you to know if fluid is building up in your body. ? Weigh yourself every morning after you pee (urinate) and before you eat breakfast. ? Wear the same amount of clothing each time. ? Write down your daily weight. Give your record to your doctor.  Check and write down your blood pressure as told by your doctor.  Check your pulse as  told by your doctor.   Dealing with very hot and very cold weather  If it is very hot: ? Avoid activities that take a lot of energy. ? Use air conditioning or fans, or find a cooler place. ? Avoid caffeine and alcohol. ? Wear clothing that is loose-fitting, lightweight, and light-colored.  If it is very cold: ? Avoid activities that take a lot of energy. ? Layer your clothes. ? Wear mittens or gloves, a hat, and a face covering when you go outside. ? Avoid alcohol. Follow these instructions at home:  Stay up to date with shots (vaccines). Get pneumococcal and flu (influenza) shots.  Keep all follow-up visits. Contact a doctor if:  You gain 2-3 lb (1-1.4 kg) in 24 hours or 5 lb (2.3 kg) in a week.  You have increasing shortness of breath.  You cannot do your normal activities.  You get tired easily.  You cough a lot.  You do not feel like eating or feel like you may vomit (nauseous).  You have swelling in your hands, feet, ankles, or belly (abdomen).  You cannot sleep well because it is hard to breathe.  You feel like your heart is beating fast (palpitations).  You get dizzy when you stand up.  You feel depressed or sad. Get help right away if:  You have trouble breathing.  You or someone else notices a change in your behavior, such as having trouble staying awake.  You have chest pain or discomfort.  You pass out (faint). These symptoms may be an emergency. Get help right away. Call your local emergency services (911 in the [place]  Do not wait to see if the symptoms will go away.  Do not drive yourself to the  hospital. Summary  Heart failure is a serious condition. To care for yourself, you may have to change your diet, take medicines, and make other lifestyle changes.  Take your medicines every day. Do not stop taking them unless your doctor tells you to do so.  Limit salt and eat heart-healthy foods.  Ask your doctor if you can drink alcohol. You may  have to stop alcohol use if you have very bad heart failure.  Contact your doctor if you gain weight quickly or feel that your heart is beating too fast. Get help right away if you pass out or have chest pain or trouble breathing. This information is not intended to replace advice given to you by your health care provider. Make sure you discuss any questions you have with your health care provider. Document Revised: 10/17/2019 Document Reviewed: 10/17/2019 Elsevier Patient Education  2021 [place]. Heart Failure, Diagnosis  Heart failure means that your heart is not able to pump blood in the right way. This makes it hard for your body to work well. Heart failure is usually a long-term (chronic) condition. You must take good care of yourself and follow your treatment plan from your doctor. What are the causes?  High blood pressure.  Buildup of cholesterol and fat in the arteries.  Heart attack. This injures the heart muscle.  Heart valves that do not open and close properly.  Damage of the heart muscle. This is also called cardiomyopathy.  Infection of the heart muscle. This is also called myocarditis.  Lung disease. What increases the risk?  Getting older. The risk of heart failure goes up as a person ages.  Being overweight.  Being male.  Use tobacco or nicotine products.  Abusing alcohol or drugs.  Having taken medicines that can damage the heart.  Having any of these conditions: ? Diabetes. ? Abnormal heart rhythms. ? Thyroid problems. ? Low blood counts (anemia).  Having a family history of heart failure. What are the signs or symptoms?  Shortness of breath.  Coughing.  Swelling of the feet, ankles, legs, or belly.  Losing or gaining weight for no reason.  Trouble breathing.  Waking from sleep because of the need to sit up and get more air.  Fast heartbeat.  Being very tired.  Feeling dizzy, or feeling like you may pass out (faint).  Having no  desire to eat.  Feeling like you may vomit (nauseous).  Peeing (urinating) more at night.  Feeling confused. How is this treated? This condition may be treated with:  Medicines. These can be given to treat blood pressure and to make the heart muscles stronger.  Changes in your daily life. These may include: ? Eating a healthy diet. ? Staying at a healthy body weight. ? Quitting tobacco, alcohol, and drug use. ? Doing exercises. ? Participating in a cardiac rehabilitation program. This program helps you improve your health through exercise, education, and counseling.  Surgery. Surgery can be done to open blocked valves, or to put devices in the heart, such as pacemakers.  A donor heart (heart transplant). You will receive a healthy heart from a donor. Follow these instructions at home:  Treat other conditions as told by your doctor. These may include high blood pressure, diabetes, thyroid disease, or abnormal heart rhythms.  Learn as much as you can about heart failure.  Get support as you need it.  Keep all follow-up visits. Summary  Heart failure means that your heart is not able to pump blood  in the right way.  This condition is often caused by high blood pressure, heart attack, or damage of the heart muscle.  Symptoms of this condition include shortness of breath and swelling of the feet, ankles, legs, or belly. You may also feel very tired or feel like you may vomit.  You may be treated with medicines, surgery, or changes in your daily life.  Treat other health conditions as told by your doctor. This information is not intended to replace advice given to you by your health care provider. Make sure you discuss any questions you have with your health care provider. Document Revised: 10/17/2019 Document Reviewed: 10/17/2019 Elsevier Patient Education  [place].

## 2020-06-23 NOTE — Progress Notes (Signed)
Progress Note  Patient Name: [name] Date of Encounter: 06/23/2020  Primary Cardiologist: [place] significantly improved this morning, Wanted to go home. Notes significant diuresis overnight with torsemide with improved lower extremity swelling. Documented UOP 2.2 L for the past 24 hours with a net - 4.1 L for the admission. Weight unchanged at 64.3 kg for the past 24 hours. ReDs vest this morning of 24. Renal function continues to improve with diuresis. HGB mildly low, though stable. INR 3.5. Gout is improving.   Inpatient Medications    Scheduled Meds: . allopurinol  100 mg Oral Daily  . carvedilol  9.375 mg Oral BID  . fluticasone  1 spray Each Nare Daily  . montelukast  10 mg Oral QHS  . predniSONE  40 mg Oral Q breakfast  . sodium chloride flush  3 mL Intravenous Q12H  . tamsulosin  0.4 mg Oral QHS  . torsemide  40 mg Oral BID  . vitamin B-12  1,000 mcg Oral Daily  . Warfarin - Pharmacist Dosing Inpatient   Does not apply q1600   Continuous Infusions: . sodium chloride     PRN Meds: sodium chloride, acetaminophen, ALPRAZolam, ondansetron (ZOFRAN) IV, polyethylene glycol, sodium chloride flush   Vital Signs    Vitals:   06/22/20 1611 06/22/20 1959 06/23/20 0404 06/23/20 0721  BP: 100/66 118/80 (!) 90/51 107/69  Pulse: (!) 59 61 65 62  Resp: 19 17 16 15   Temp: 98.4 F (36.9 C) (!) 97.5 F (36.4 C) 98.4 F (36.9 C) 97.8 F (36.6 C)  TempSrc: Oral Oral  Oral  SpO2: 96% (!) 76% 96% (!) 89%  Weight:   64.3 kg   Height:        Intake/Output Summary (Last 24 hours) at 06/23/2020 1030 Last data filed at 06/23/2020 0945 Gross per 24 hour  Intake 600 ml  Output 2825 ml  Net -2225 ml   Filed Weights   06/21/20 1500 06/22/20 0500 06/23/20 0404  Weight: 64.6 kg 64.3 kg 64.3 kg    Telemetry    V-paced with underlying atrial flutter - Personally Reviewed  ECG    No new tracings - Personally Reviewed  Physical Exam    GEN: No acute distress.   Neck: No JVD. Cardiac: RRR, no murmurs, rubs, or gallops.  Respiratory: Clear to auscultation bilaterally.  GI: Soft, nontender, non-distended.   MS: Trace pedal edema bilaterally; No deformity. Neuro:  Alert and oriented x 3; Nonfocal.  Psych: Normal affect.  Labs    Chemistry Recent Labs  Lab 06/17/20 1555 06/18/20 0542 06/21/20 0422 06/22/20 0428 06/23/20 0420  NA 124*   < > 135 136 135  K 5.6*   < > 3.5 4.2 3.6  CL 90*   < > 98 98 98  CO2 26   < > 28 29 26   GLUCOSE 124*   < > 84 124* 126*  BUN 39*   < > 26* 24* 28*  CREATININE 1.60*   < > 1.39* 1.33* 1.21  CALCIUM 8.2*   < > 8.4* 8.6* 8.4*  PROT 6.1*  --   --   --   --   ALBUMIN 3.6  --   --   --   --   AST 33  --   --   --   --   ALT 24  --   --   --   --   ALKPHOS 97  --   --   --   --  BILITOT 1.5*  --   --   --   --   GFRNONAA 42*   < > 49* 52* 58*  ANIONGAP 8   < > 9 9 11    < > = values in this interval not displayed.     Hematology Recent Labs  Lab 06/21/20 0422 06/22/20 0428 06/23/20 0420  WBC 8.0 6.9 7.4  RBC 4.20* 4.03* 3.94*  HGB 12.7* 12.0* 12.0*  HCT 38.4* 36.8* 35.7*  MCV 91.4 91.3 90.6  MCH 30.2 29.8 30.5  MCHC 33.1 32.6 33.6  RDW 16.8* 16.7* 16.8*  PLT 141* 146* 146*    Cardiac EnzymesNo results for input(s): TROPONINI in the last 168 hours. No results for input(s): TROPIPOC in the last 168 hours.   BNP Recent Labs  Lab 06/17/20 1555 06/19/20 0600  BNP 2,366.8* 2,034.7*     DDimer No results for input(s): DDIMER in the last 168 hours.   Radiology    No results found.  Cardiac Studies   2D echo 06/18/2020: 1. Left ventricular ejection fraction, by estimation, is 20 to 25%. The  left ventricle has severely decreased function. The left ventricle  demonstrates global hypokinesis. The left ventricular internal cavity size  was mildly dilated. Left ventricular  diastolic parameters are indeterminate.  2. Right ventricular systolic function is  normal. The right ventricular  size is normal. There is moderately elevated pulmonary artery systolic  pressure. The estimated right ventricular systolic pressure is 92.4 mmHg.  3. Left atrial size was severely dilated.  4. Right atrial size was moderately dilated.  5. The mitral valve is normal in structure. Mild mitral valve  regurgitation. No evidence of mitral stenosis.  6. Tricuspid valve regurgitation is mild to moderate.  7. The inferior vena cava is dilated in size with <50% respiratory  variability, suggesting right atrial pressure of 15 mmHg.   Patient Profile     87 y.o. male with history of atrial flutter s/p DCCV and failed Tikosyn therapy on Coumadin, HFrEF secondary to NICM with an EF of 20-25% in 2019 with LBBB and history of complete heart block s/p BiV ICD s/p generator change out in 04/2015, pulmonary hypertension, celiac aneurysm, COPD, HTN, gout, GERD, BPH, and IBS who we are seeing for acute on chronic HFrEF.   Assessment & Plan    1. Acute on chronic HFrEF secondary to NICM: -Volume status is significantly improved with a normal ReDs vest this morning -Continue torsemide 40 mg bid, if his weight continues to drop, may need to decrease this to daily dosing -Coreg -Relative hypotension has precluded escalation of GDMT including ACEi/ARB/MRA/ARNI/SGLT2i -CHF education discussed   2. Permanent atrial flutter: -Status post failed RFCA -Continue Coreg -Coumadin per pharmacy  -HGB stable  3. AKI: -Renal function improved with diuresis  -Back to his ~ baseline  -Will need follow up labs in the outpatient setting  4. HTN: -Blood pressure well controlled -Coreg and torsemide as above  For questions or updates, please contact [place] Please consult [email or web address] for contact info under Cardiology/STEMI.    Signed, Christell Faith, PA-C [place] Pager: [phone] 06/23/2020, 10:31 AM

## 2020-06-23 NOTE — Plan of Care (Signed)
Pt ready for discharge IV and tele removed Discharge appointments made Pt and family updated on discharge planning Time allowed for questions and concerns Verbalizes an understanding on discharge planning Denies any additional wants or needs at this time Patient discharged home with family   Problem: Activity: Goal: Capacity to carry out activities will improve Outcome: Adequate for Discharge   Problem: Cardiac: Goal: Ability to achieve and maintain adequate cardiopulmonary perfusion will improve Outcome: Adequate for Discharge   Problem: Education: Goal: Knowledge of General Education information will improve Description: Including pain rating scale, medication(s)/side effects and non-pharmacologic comfort measures Outcome: Adequate for Discharge   Problem: Health Behavior/Discharge Planning: Goal: Ability to manage health-related needs will improve Outcome: Adequate for Discharge   Problem: Clinical Measurements: Goal: Ability to maintain clinical measurements within normal limits will improve Outcome: Adequate for Discharge Goal: Will remain free from infection Outcome: Adequate for Discharge Goal: Diagnostic test results will improve Outcome: Adequate for Discharge Goal: Respiratory complications will improve Outcome: Adequate for Discharge Goal: Cardiovascular complication will be avoided Outcome: Adequate for Discharge   Problem: Activity: Goal: Risk for activity intolerance will decrease Outcome: Adequate for Discharge   Problem: Nutrition: Goal: Adequate nutrition will be maintained Outcome: Adequate for Discharge   Problem: Coping: Goal: Level of anxiety will decrease Outcome: Adequate for Discharge   Problem: Elimination: Goal: Will not experience complications related to bowel motility Outcome: Adequate for Discharge Goal: Will not experience complications related to urinary retention Outcome: Adequate for Discharge   Problem: Pain Managment: Goal:  General experience of comfort will improve Outcome: Adequate for Discharge   Problem: Safety: Goal: Ability to remain free from injury will improve Outcome: Adequate for Discharge   Problem: Skin Integrity: Goal: Risk for impaired skin integrity will decrease Outcome: Adequate for Discharge

## 2020-06-24 ENCOUNTER — Ambulatory Visit (INDEPENDENT_AMBULATORY_CARE_PROVIDER_SITE_OTHER): Payer: Medicare HMO | Admitting: Cardiology

## 2020-06-24 ENCOUNTER — Telehealth: Payer: Self-pay

## 2020-06-24 DIAGNOSIS — I4892 Unspecified atrial flutter: Secondary | ICD-10-CM | POA: Diagnosis not present

## 2020-06-24 DIAGNOSIS — Z5181 Encounter for therapeutic drug level monitoring: Secondary | ICD-10-CM

## 2020-06-24 DIAGNOSIS — Z7901 Long term (current) use of anticoagulants: Secondary | ICD-10-CM

## 2020-06-24 LAB — CUP PACEART REMOTE DEVICE CHECK
Battery Remaining Longevity: 14 mo
Battery Voltage: 2.89 V
Brady Statistic RA Percent Paced: 0 %
Brady Statistic RV Percent Paced: 91.48 %
Date Time Interrogation Session: 20220317223723
HighPow Impedance: 40 Ohm
HighPow Impedance: 54 Ohm
Implantable Lead Implant Date: 20070525
Implantable Lead Implant Date: 20070525
Implantable Lead Implant Date: 20070525
Implantable Lead Location: 753858
Implantable Lead Location: 753859
Implantable Lead Location: 753860
Implantable Lead Model: 4194
Implantable Lead Model: 5076
Implantable Lead Model: 6949
Implantable Pulse Generator Implant Date: 20170116
Lead Channel Impedance Value: 228 Ohm
Lead Channel Impedance Value: 304 Ohm
Lead Channel Impedance Value: 304 Ohm
Lead Channel Impedance Value: 456 Ohm
Lead Channel Impedance Value: 456 Ohm
Lead Channel Impedance Value: 475 Ohm
Lead Channel Pacing Threshold Amplitude: 0.75 V
Lead Channel Pacing Threshold Amplitude: 0.875 V
Lead Channel Pacing Threshold Pulse Width: 0.4 ms
Lead Channel Pacing Threshold Pulse Width: 0.4 ms
Lead Channel Sensing Intrinsic Amplitude: 10.625 mV
Lead Channel Sensing Intrinsic Amplitude: 10.625 mV
Lead Channel Sensing Intrinsic Amplitude: 2.125 mV
Lead Channel Sensing Intrinsic Amplitude: 2.125 mV
Lead Channel Setting Pacing Amplitude: 2 V
Lead Channel Setting Pacing Amplitude: 2 V
Lead Channel Setting Pacing Amplitude: 2 V
Lead Channel Setting Pacing Pulse Width: 0.4 ms
Lead Channel Setting Pacing Pulse Width: 0.4 ms
Lead Channel Setting Sensing Sensitivity: 0.45 mV

## 2020-06-24 NOTE — Telephone Encounter (Signed)
Pt called. See anti-coag encounter from 3/18 for further documentation.

## 2020-06-24 NOTE — Patient Instructions (Signed)
Description    Called and spoke to pt, instructed him to hold warfarin today (3/18). On 3/19 & 3/20 take 1/2 a tablet of warfarin; Then go back to normal dose of warfarin 1 tablet daily except for 1/2 a tablet on Monday. Recheck INR on 3/23. Call coumadin clinic for any concerns.

## 2020-06-24 NOTE — Telephone Encounter (Signed)
Patient states he was in the hospital with gout and was prescribed prednisone. Please call to discuss his coumadin doseage.

## 2020-06-25 ENCOUNTER — Telehealth: Payer: Self-pay | Admitting: Physician Assistant

## 2020-06-25 DIAGNOSIS — I5021 Acute systolic (congestive) heart failure: Secondary | ICD-10-CM

## 2020-06-25 NOTE — Discharge Summary (Signed)
at [name]: [name]    MR#:  [id number]  DATE OF BIRTH:  Mar 27, 1932  DATE OF ADMISSION:  06/17/2020   ADMITTING PHYSICIAN: Amy N Cox, DO  DATE OF DISCHARGE: 06/23/2020 12:33 PM  PRIMARY CARE PHYSICIAN: Ria Bush, MD   ADMISSION DIAGNOSIS:  Shortness of breath [R06.02] Hypervolemia, unspecified hypervolemia type [E87.70] SOB (shortness of breath) [R06.02] DISCHARGE DIAGNOSIS:  Principal Problem:   Shortness of breath Active Problems:   Anxiety   Atrial flutter (HCC)   GERD   Automatic implantable cardioverter-defibrillator in situ   Warfarin anticoagulation   Pulmonary HTN (HCC)   Aortic valve regurgitation   Chronic atrial fibrillation (HCC)   Hypervolemia   SOB (shortness of breath)   Acute on chronic HFrEF (heart failure with reduced ejection fraction) ([place])   Pain  SECONDARY DIAGNOSIS:   Past Medical History:  Diagnosis Date  . AICD (automatic cardioverter/defibrillator) present    s/p gen change 04/2015 w/ MDT [name] Crt-D  DTBA1D1, Serial number [id number]  . Allergic rhinitis    [name]  . Anemia   . Anxiety   . Arthritis   . Atrial flutter ([place])    A.  Status post cardioversion; B.  Tikosyn therapy - failed, remains in aflutter  . Bilateral pneumonia 11/02/2013   treated with levaquin  . BPH (benign prostatic hyperplasia)   . 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 Lucky Cowboy)  . Chronic lung disease    spirometry 2015 - no obstruction, + mild restrictive lung disease  . Chronic sinusitis   . Chronic systolic congestive heart failure ([place])   . Dyspnea    with exertion  . Extrinsic asthma    [name]  . Fall with injury 05/28/2017  . GERD (gastroesophageal reflux disease) 2010   h/o esophageal stricture with dilation, LA grade C reflux esophagitis by EGD 2010  . Goiter   . History of diverticulitis of colon 06/2020  . History of GI bleed    Secondary to hemorrhoids  .  History of seasonal allergies   . Hypertension   . IBS (irritable bowel syndrome)   . LBBB (left bundle branch block)   . Multiple pulmonary nodules 06/2013   RUL ([facility]) - ?vasculitis as of last CT at [facility]  . NICM (nonischemic cardiomyopathy) [facility])    Cardiac catheterization March 2006 without coronary disease; EF 25% 2018 Jan  . Orthopnea   . 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 Presence of permanent cardiac pacemaker   . Sinus congestion 09/25/2010   HOSPITAL COURSE:  [name] Reesor Gerringeris a 87 y.o.malewith medical history significant forhypertension, anxiety, atrial flutter, aortic valve regurgitation, BPH, chronic combined heart failure, left bundle branch block, pulmonary hypertension, presented to the emergency department for chief concerns of shortness of breath.   Acute on chronic systolic heart failure(EF 20-25%, 2019) severe CMP Severe pulmonary hypertension Nonischemic cardiomyopathy s/p AICD --Diuresed with IV lasix while in the hospital and switched to PO Torsemide 40 mg po BID on 3/16 and continued at D/C - He is requested to call cardio office for adjustment in Torsemide dose if > 3 lbs weight gain or loss in less than a week Net IO Since Admission: -1,810 mL [06/22/20 1552] -Outpatient CHF/pulm clinic follow-up --echo 06/18/2020:EF 20-25% severe CMP  Acute gout flare In the right foot - improved on steroids. Being D/C on prednisone taper Uric acid is 12.8 Started  allopurinol on 3/16 and continued at D/C Foot x-rays negative for any acute pathology D/w Dr Jefm Bryant. outpt f/up in his office in 2 weeks.  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 Monitor INR as an outpt   HTN --Current BP well controlled  Diverticulitis --Recent ED visit for diverticulitis.  --pt has not been taking his augmentin as prescribed and given lot of GI issues he does not want to cont with it. Denies any abd pain. --Tolerating po diet  Anxiety--prn xanax  Right shoulder ecchymosis s/p mech fall at home No fracture PT/OT recommends home health which has been set [facility] by [facility]  BPH--cont flomax   DISCHARGE CONDITIONS:  stable CONSULTS OBTAINED:  Treatment Team:  Minna Merritts, MD DRUG ALLERGIES:   DISCHARGE MEDICATIONS:   Allergies as of 06/23/2020      Reactions   Carafate [sucralfate] Other (See Comments)   Burns stomach   Clarithromycin Nausea Only   Famotidine Other (See Comments)   ABD. PAIN   Levaquin [levofloxacin] Other (See Comments)   Stomach pain, has to eat a lot of food   Omeprazole Diarrhea   Oxytetracycline Other (See Comments)   BUMPS          Proton Pump Inhibitors Other (See Comments)   GI upset, diarrhea, gas, bloating   Ranitidine Diarrhea   Doxycycline Rash      Medication List    STOP taking these medications   amoxicillin-clavulanate 875-125 MG tablet Commonly known as: Augmentin   furosemide 40 MG tablet Commonly known as: LASIX   predniSONE 5 MG tablet Commonly known as: DELTASONE Replaced by: predniSONE 10 MG (21) Tbpk tablet     TAKE these medications   acetaminophen 500 MG tablet Commonly known as: TYLENOL Take 250 mg by mouth daily as needed. Pain   allopurinol 100 MG tablet Commonly known as: ZYLOPRIM Take 1 tablet (100 mg total) by mouth daily.   ALPRAZolam 0.25 MG tablet Commonly known as: XANAX TAKE 1 TABLET BY MOUTH EVERYDAY AT BEDTIME   alum & mag hydroxide-simeth 200-200-20 MG/5ML suspension Commonly known as: MAALOX/MYLANTA Take 15 mLs by mouth daily as needed for indigestion or  heartburn.   carvedilol 6.25 MG tablet Commonly known as: COREG TAKE 1 AND 1/2 TABLETS BY MOUTH TWICE DAILY   fluticasone 50 MCG/ACT nasal spray Commonly known as: FLONASE Place 1 spray into both nostrils daily.   montelukast 10 MG tablet Commonly known as: SINGULAIR Take 10 mg by mouth at bedtime.   polyethylene glycol 17 g packet Commonly known as: MIRALAX / GLYCOLAX Take 17 g by mouth daily as needed (constipation).   predniSONE 10 MG (21) Tbpk tablet Commonly known as: STERAPRED UNI-PAK 21 TAB Start 60 mg once daily, taper 10 mg daily until stays on 10 mg daily dose which is his chronic maintenance dose Replaces: predniSONE 5 MG tablet   tamsulosin 0.4 MG Caps capsule Commonly known as: FLOMAX TAKE 1 CAPSULE BY MOUTH EVERY DAY What changed: when to take this   Torsemide 40 MG Tabs Take 40 mg by mouth 2 (two) times daily.   vitamin B-12 500 MCG tablet  Commonly known as: CYANOCOBALAMIN Take 2 tablets (1,000 mcg total) by mouth daily.   warfarin 5 MG tablet Commonly known as: COUMADIN Take as directed. If you are unsure how to take this medication, talk to your nurse or doctor. Original instructions: TAKE 1/2 TO 1 TABLET BY MOUTH DAILY AS DIRECTED BY COUMADIN CLINIC      DISCHARGE INSTRUCTIONS:   DIET:  Cardiac diet DISCHARGE CONDITION:  Stable ACTIVITY:  Activity as tolerated OXYGEN:  Home Oxygen: No.  Oxygen Delivery: room air DISCHARGE LOCATION:  home with HHPT, OT  If you experience worsening of your admission symptoms, develop shortness of breath, life threatening emergency, suicidal or homicidal thoughts you must seek medical attention immediately by calling 911 or calling your MD immediately  if symptoms less severe.  You Must read complete instructions/literature along with all the possible adverse reactions/side effects for all the Medicines you take and that have been prescribed to you. Take any new Medicines after you have completely understood  and accpet all the possible adverse reactions/side effects.   Please note  You were cared for by a hospitalist during your hospital stay. If you have any questions about your discharge medications or the care you received while you were in the hospital after you are discharged, you can call the unit and asked to speak with the hospitalist on call if the hospitalist that took care of you is not available. Once you are discharged, your primary care physician will handle any further medical issues. Please note that NO REFILLS for any discharge medications will be authorized once you are discharged, as it is imperative that you return to your primary care physician (or establish a relationship with a primary care physician if you do not have one) for your aftercare needs so that they can reassess your need for medications and monitor your lab values.    On the day of Discharge:  VITAL SIGNS:  Blood pressure (!) 104/59, pulse 60, temperature 97.9 F (36.6 C), resp. rate 15, height 5\' 4"  (1.626 m), weight 64.3 kg, SpO2 98 %. PHYSICAL EXAMINATION:  GENERAL:  87 y.o.-year-old patient lying in the bed with no acute distress.  EYES: Pupils equal, round, reactive to light and accommodation. No scleral icterus. Extraocular muscles intact.  HEENT: Head atraumatic, normocephalic. Oropharynx and nasopharynx clear.  NECK:  Supple, no jugular venous distention. No thyroid enlargement, no tenderness.  LUNGS: Normal breath sounds bilaterally, no wheezing, rales,rhonchi or crepitation. No use of accessory muscles of respiration.  CARDIOVASCULAR: S1, S2 normal. No murmurs, rubs, or gallops.  ABDOMEN: Soft, non-tender, non-distended. Bowel sounds present. No organomegaly or mass.  EXTREMITIES: No pedal edema, cyanosis, or clubbing.  NEUROLOGIC: Cranial nerves II through XII are intact. Muscle strength 5/5 in all extremities. Sensation intact. Gait not checked.  PSYCHIATRIC: The patient is alert and oriented x 3.   SKIN: No obvious rash, lesion, or ulcer.  DATA REVIEW:   CBC Recent Labs  Lab 06/23/20 0420  WBC 7.4  HGB 12.0*  HCT 35.7*  PLT 146*    Chemistries  Recent Labs  Lab 06/23/20 0420  NA 135  K 3.6  CL 98  CO2 26  GLUCOSE 126*  BUN 28*  CREATININE 1.21  CALCIUM 8.4*     Outpatient follow-up  Follow-up Information    [facility].   Why: physical therapy, occupational therapy, nurse, aid        Ria Bush, MD. Schedule an appointment as soon as possible for a visit  on 07/01/2020.   Specialty: Family Medicine Why: @ 11am Please be there at 10:45am for check in. Contact information: [place] [address] [phone]        [name], MD. Schedule an appointment as soon as possible for a visit on 07/13/2020.   Specialty: Cardiology Why: @ 9am Contact information: [address] [place] [address] [phone]        Emmaline Kluver., MD. Schedule an appointment as soon as possible for a visit on 07/07/2020.   Specialty: Rheumatology Why: @ 9am Contact information: [place] [address] [phone]               4 Day Unplanned Readmission Risk Score   Flowsheet Row ED to Hosp-Admission (Discharged) from 06/17/2020 in [facility]  30 Day Unplanned Readmission Risk Score (%) 26.62 Filed at 06/23/2020 1200     This score is the patient's risk of an unplanned readmission within 30 days of being discharged (0 -100%). The score is based on dignosis, age, lab data, medications, orders, and past utilization.   Low:  0-14.9   Medium: 15-21.9   High: 22-29.9   Extreme: 30 and above         Management plans discussed with the patient, family and they are in agreement.  CODE STATUS: Prior   TOTAL TIME TAKING CARE OF THIS PATIENT: 45 minutes.    Max Sane M.D on 06/25/2020 at 8:55 PM  [facility]   CC:  Primary care physician; Ria Bush, MD   Note: This dictation was prepared with Dragon dictation along with smaller phrase technology. Any transcriptional errors that result from this process are unintentional.

## 2020-06-25 NOTE — Telephone Encounter (Signed)
87 y.o. male recently dc after admit to [facility] for a/c (HFrEF) heart failure with reduced ejection fraction.  He was switched to torsemide 40 mg twice daily.  His son Octavia Bruckner - DPR on file) called today due to concerns over continued weight loss.  His weight is down to 137 lbs.  This seems to be his baseline.  The pt is doing well w/o shortness of breath, significant swelling, dizziness. PLAN: 1. Decrease Torsemide to 40 mg in A and 20 mg in P 2. If weight increases to 138 or higher, resume 40 mg twice daily 3. Will get BMET to f/u this week (has coumadin clinic visit Wed 3/23). Richardson Dopp, PA-C    06/25/2020 9:33 AM

## 2020-06-26 DIAGNOSIS — I5043 Acute on chronic combined systolic (congestive) and diastolic (congestive) heart failure: Secondary | ICD-10-CM | POA: Diagnosis not present

## 2020-06-26 DIAGNOSIS — J984 Other disorders of lung: Secondary | ICD-10-CM | POA: Diagnosis not present

## 2020-06-26 DIAGNOSIS — D649 Anemia, unspecified: Secondary | ICD-10-CM | POA: Diagnosis not present

## 2020-06-26 DIAGNOSIS — I4892 Unspecified atrial flutter: Secondary | ICD-10-CM | POA: Diagnosis not present

## 2020-06-26 DIAGNOSIS — I483 Typical atrial flutter: Secondary | ICD-10-CM | POA: Diagnosis not present

## 2020-06-26 DIAGNOSIS — I482 Chronic atrial fibrillation, unspecified: Secondary | ICD-10-CM | POA: Diagnosis not present

## 2020-06-26 DIAGNOSIS — K219 Gastro-esophageal reflux disease without esophagitis: Secondary | ICD-10-CM | POA: Diagnosis not present

## 2020-06-26 DIAGNOSIS — J45909 Unspecified asthma, uncomplicated: Secondary | ICD-10-CM | POA: Diagnosis not present

## 2020-06-26 DIAGNOSIS — I447 Left bundle-branch block, unspecified: Secondary | ICD-10-CM | POA: Diagnosis not present

## 2020-06-26 DIAGNOSIS — M1990 Unspecified osteoarthritis, unspecified site: Secondary | ICD-10-CM | POA: Diagnosis not present

## 2020-06-26 DIAGNOSIS — G4733 Obstructive sleep apnea (adult) (pediatric): Secondary | ICD-10-CM | POA: Diagnosis not present

## 2020-06-26 DIAGNOSIS — J329 Chronic sinusitis, unspecified: Secondary | ICD-10-CM | POA: Diagnosis not present

## 2020-06-26 DIAGNOSIS — K5732 Diverticulitis of large intestine without perforation or abscess without bleeding: Secondary | ICD-10-CM | POA: Diagnosis not present

## 2020-06-26 DIAGNOSIS — I272 Pulmonary hypertension, unspecified: Secondary | ICD-10-CM | POA: Diagnosis not present

## 2020-06-26 DIAGNOSIS — I499 Cardiac arrhythmia, unspecified: Secondary | ICD-10-CM | POA: Diagnosis not present

## 2020-06-26 DIAGNOSIS — M109 Gout, unspecified: Secondary | ICD-10-CM | POA: Diagnosis not present

## 2020-06-26 DIAGNOSIS — E871 Hypo-osmolality and hyponatremia: Secondary | ICD-10-CM | POA: Diagnosis not present

## 2020-06-26 DIAGNOSIS — I351 Nonrheumatic aortic (valve) insufficiency: Secondary | ICD-10-CM | POA: Diagnosis not present

## 2020-06-26 DIAGNOSIS — S4291XD Fracture of right shoulder girdle, part unspecified, subsequent encounter for fracture with routine healing: Secondary | ICD-10-CM | POA: Diagnosis not present

## 2020-06-26 DIAGNOSIS — I110 Hypertensive heart disease with heart failure: Secondary | ICD-10-CM | POA: Diagnosis not present

## 2020-06-26 DIAGNOSIS — E049 Nontoxic goiter, unspecified: Secondary | ICD-10-CM | POA: Diagnosis not present

## 2020-06-26 DIAGNOSIS — E878 Other disorders of electrolyte and fluid balance, not elsewhere classified: Secondary | ICD-10-CM | POA: Diagnosis not present

## 2020-06-26 DIAGNOSIS — I728 Aneurysm of other specified arteries: Secondary | ICD-10-CM | POA: Diagnosis not present

## 2020-06-26 DIAGNOSIS — J309 Allergic rhinitis, unspecified: Secondary | ICD-10-CM | POA: Diagnosis not present

## 2020-06-27 ENCOUNTER — Telehealth: Payer: Self-pay

## 2020-06-27 NOTE — Telephone Encounter (Signed)
I spoke with the patient this morning to follow up on his weights.  3/20- 137.4 lbs 3/21- 136.6 lbs  The patient confirms he has just been taking the torsemide 40 mg in the AM 20 mg in the afternoon.   I have advised him if his weight goes to 138 lbs/ higher, then he can take torsemide 40 mg BID.  The patient voices understanding and is agreeable.  I was also talking with him about his lab appointment- he asked that I speak with is wife about this. I advised Mrs. Soulliere that we do need to get a BMP this week, preferably today or tomorrow per Richardson Dopp, PA, but we can get this on Wednesday if this works better for them.   Per the Mrs. Anastasi, the preference would be to make 1 trip.  I have advised this is fine and asked that they be here in our office at 3:00 pm for the lab, then he can have his warfarin checked after that at 3:30 pm. Lab appointment made.   The patient's wife voices understanding and is agreeable.

## 2020-06-27 NOTE — Telephone Encounter (Signed)
Agree with this.  

## 2020-06-27 NOTE — Telephone Encounter (Signed)
Pam nurse with [facility] which was formerly [facility] left v/m requesting verbal orders for [facility] skilled nursing for cardiac assessment and education 2 x a wk for 1 wk;   1 x a wk for 2 wks : 1 month x 1 visit ; and  2 prn visits. Pam also said pt would need OT and PT.

## 2020-06-28 NOTE — Telephone Encounter (Signed)
Left message at the office number for a call back from the nurse manager.

## 2020-06-28 NOTE — Telephone Encounter (Signed)
Verbal orders given to [facility] at [facility]. HH as instructed.

## 2020-06-29 ENCOUNTER — Other Ambulatory Visit (INDEPENDENT_AMBULATORY_CARE_PROVIDER_SITE_OTHER): Payer: Medicare HMO

## 2020-06-29 ENCOUNTER — Ambulatory Visit: Payer: Medicare HMO | Admitting: Family

## 2020-06-29 ENCOUNTER — Ambulatory Visit: Payer: Medicare HMO

## 2020-06-29 ENCOUNTER — Other Ambulatory Visit: Payer: Self-pay

## 2020-06-29 DIAGNOSIS — I482 Chronic atrial fibrillation, unspecified: Secondary | ICD-10-CM | POA: Diagnosis not present

## 2020-06-29 DIAGNOSIS — Z7901 Long term (current) use of anticoagulants: Secondary | ICD-10-CM

## 2020-06-29 DIAGNOSIS — I5021 Acute systolic (congestive) heart failure: Secondary | ICD-10-CM

## 2020-06-29 DIAGNOSIS — M1990 Unspecified osteoarthritis, unspecified site: Secondary | ICD-10-CM | POA: Diagnosis not present

## 2020-06-29 DIAGNOSIS — I4892 Unspecified atrial flutter: Secondary | ICD-10-CM | POA: Diagnosis not present

## 2020-06-29 DIAGNOSIS — Z5181 Encounter for therapeutic drug level monitoring: Secondary | ICD-10-CM | POA: Diagnosis not present

## 2020-06-29 DIAGNOSIS — E871 Hypo-osmolality and hyponatremia: Secondary | ICD-10-CM | POA: Diagnosis not present

## 2020-06-29 DIAGNOSIS — E049 Nontoxic goiter, unspecified: Secondary | ICD-10-CM | POA: Diagnosis not present

## 2020-06-29 DIAGNOSIS — J329 Chronic sinusitis, unspecified: Secondary | ICD-10-CM | POA: Diagnosis not present

## 2020-06-29 DIAGNOSIS — M109 Gout, unspecified: Secondary | ICD-10-CM | POA: Diagnosis not present

## 2020-06-29 DIAGNOSIS — J45909 Unspecified asthma, uncomplicated: Secondary | ICD-10-CM | POA: Diagnosis not present

## 2020-06-29 DIAGNOSIS — S4291XD Fracture of right shoulder girdle, part unspecified, subsequent encounter for fracture with routine healing: Secondary | ICD-10-CM | POA: Diagnosis not present

## 2020-06-29 DIAGNOSIS — J984 Other disorders of lung: Secondary | ICD-10-CM | POA: Diagnosis not present

## 2020-06-29 DIAGNOSIS — D649 Anemia, unspecified: Secondary | ICD-10-CM | POA: Diagnosis not present

## 2020-06-29 DIAGNOSIS — G4733 Obstructive sleep apnea (adult) (pediatric): Secondary | ICD-10-CM | POA: Diagnosis not present

## 2020-06-29 DIAGNOSIS — E878 Other disorders of electrolyte and fluid balance, not elsewhere classified: Secondary | ICD-10-CM | POA: Diagnosis not present

## 2020-06-29 DIAGNOSIS — K219 Gastro-esophageal reflux disease without esophagitis: Secondary | ICD-10-CM | POA: Diagnosis not present

## 2020-06-29 DIAGNOSIS — J309 Allergic rhinitis, unspecified: Secondary | ICD-10-CM | POA: Diagnosis not present

## 2020-06-29 DIAGNOSIS — I351 Nonrheumatic aortic (valve) insufficiency: Secondary | ICD-10-CM | POA: Diagnosis not present

## 2020-06-29 DIAGNOSIS — I5043 Acute on chronic combined systolic (congestive) and diastolic (congestive) heart failure: Secondary | ICD-10-CM | POA: Diagnosis not present

## 2020-06-29 DIAGNOSIS — I499 Cardiac arrhythmia, unspecified: Secondary | ICD-10-CM | POA: Diagnosis not present

## 2020-06-29 DIAGNOSIS — I272 Pulmonary hypertension, unspecified: Secondary | ICD-10-CM | POA: Diagnosis not present

## 2020-06-29 DIAGNOSIS — I728 Aneurysm of other specified arteries: Secondary | ICD-10-CM | POA: Diagnosis not present

## 2020-06-29 DIAGNOSIS — K5732 Diverticulitis of large intestine without perforation or abscess without bleeding: Secondary | ICD-10-CM | POA: Diagnosis not present

## 2020-06-29 DIAGNOSIS — I447 Left bundle-branch block, unspecified: Secondary | ICD-10-CM | POA: Diagnosis not present

## 2020-06-29 DIAGNOSIS — I110 Hypertensive heart disease with heart failure: Secondary | ICD-10-CM | POA: Diagnosis not present

## 2020-06-29 LAB — POCT INR: INR: 3.5 — AB (ref 2.0–3.0)

## 2020-06-29 NOTE — Patient Instructions (Signed)
-   skip warfarin tonight, then - START NEW dosage of warfarin 1 tablet daily except for 1/2 a tablet on MONDAYS, [place].  - Recheck INR in 2 weeks  Call coumadin clinic for any concerns.

## 2020-06-30 LAB — BASIC METABOLIC PANEL
BUN/Creatinine Ratio: 28 — ABNORMAL HIGH (ref 10–24)
BUN: 37 mg/dL — ABNORMAL HIGH (ref 8–27)
CO2: 26 mmol/L (ref 20–29)
Calcium: 8.8 mg/dL (ref 8.6–10.2)
Chloride: 94 mmol/L — ABNORMAL LOW (ref 96–106)
Creatinine, Ser: 1.32 mg/dL — ABNORMAL HIGH (ref 0.76–1.27)
Glucose: 66 mg/dL (ref 65–99)
Potassium: 5 mmol/L (ref 3.5–5.2)
Sodium: 139 mmol/L (ref 134–144)
eGFR: 53 mL/min/{1.73_m2} — ABNORMAL LOW (ref >59)

## 2020-06-30 NOTE — Progress Notes (Signed)
Remote ICD transmission.   

## 2020-07-01 ENCOUNTER — Ambulatory Visit (INDEPENDENT_AMBULATORY_CARE_PROVIDER_SITE_OTHER): Payer: Medicare HMO | Admitting: Family Medicine

## 2020-07-01 ENCOUNTER — Other Ambulatory Visit: Payer: Self-pay

## 2020-07-01 ENCOUNTER — Encounter: Payer: Self-pay | Admitting: Family Medicine

## 2020-07-01 ENCOUNTER — Telehealth: Payer: Self-pay | Admitting: Family Medicine

## 2020-07-01 VITALS — BP 106/68 | HR 51 | Temp 97.9°F | Ht 64.0 in | Wt 139.0 lb

## 2020-07-01 DIAGNOSIS — K219 Gastro-esophageal reflux disease without esophagitis: Secondary | ICD-10-CM | POA: Diagnosis not present

## 2020-07-01 DIAGNOSIS — I429 Cardiomyopathy, unspecified: Secondary | ICD-10-CM | POA: Diagnosis not present

## 2020-07-01 DIAGNOSIS — I482 Chronic atrial fibrillation, unspecified: Secondary | ICD-10-CM

## 2020-07-01 DIAGNOSIS — K5909 Other constipation: Secondary | ICD-10-CM | POA: Diagnosis not present

## 2020-07-01 DIAGNOSIS — Z95810 Presence of automatic (implantable) cardiac defibrillator: Secondary | ICD-10-CM

## 2020-07-01 DIAGNOSIS — J309 Allergic rhinitis, unspecified: Secondary | ICD-10-CM | POA: Diagnosis not present

## 2020-07-01 DIAGNOSIS — I4892 Unspecified atrial flutter: Secondary | ICD-10-CM | POA: Diagnosis not present

## 2020-07-01 DIAGNOSIS — I272 Pulmonary hypertension, unspecified: Secondary | ICD-10-CM | POA: Diagnosis not present

## 2020-07-01 DIAGNOSIS — Z7901 Long term (current) use of anticoagulants: Secondary | ICD-10-CM | POA: Diagnosis not present

## 2020-07-01 DIAGNOSIS — I5023 Acute on chronic systolic (congestive) heart failure: Secondary | ICD-10-CM | POA: Diagnosis not present

## 2020-07-01 DIAGNOSIS — J329 Chronic sinusitis, unspecified: Secondary | ICD-10-CM | POA: Diagnosis not present

## 2020-07-01 DIAGNOSIS — M1990 Unspecified osteoarthritis, unspecified site: Secondary | ICD-10-CM | POA: Diagnosis not present

## 2020-07-01 DIAGNOSIS — M109 Gout, unspecified: Secondary | ICD-10-CM | POA: Diagnosis not present

## 2020-07-01 DIAGNOSIS — R69 Illness, unspecified: Secondary | ICD-10-CM | POA: Diagnosis not present

## 2020-07-01 DIAGNOSIS — D649 Anemia, unspecified: Secondary | ICD-10-CM | POA: Diagnosis not present

## 2020-07-01 DIAGNOSIS — E878 Other disorders of electrolyte and fluid balance, not elsewhere classified: Secondary | ICD-10-CM | POA: Diagnosis not present

## 2020-07-01 DIAGNOSIS — J45909 Unspecified asthma, uncomplicated: Secondary | ICD-10-CM | POA: Diagnosis not present

## 2020-07-01 DIAGNOSIS — I728 Aneurysm of other specified arteries: Secondary | ICD-10-CM | POA: Diagnosis not present

## 2020-07-01 DIAGNOSIS — I5022 Chronic systolic (congestive) heart failure: Secondary | ICD-10-CM

## 2020-07-01 DIAGNOSIS — I447 Left bundle-branch block, unspecified: Secondary | ICD-10-CM | POA: Diagnosis not present

## 2020-07-01 DIAGNOSIS — F419 Anxiety disorder, unspecified: Secondary | ICD-10-CM

## 2020-07-01 DIAGNOSIS — I110 Hypertensive heart disease with heart failure: Secondary | ICD-10-CM | POA: Diagnosis not present

## 2020-07-01 DIAGNOSIS — J984 Other disorders of lung: Secondary | ICD-10-CM | POA: Diagnosis not present

## 2020-07-01 DIAGNOSIS — G4733 Obstructive sleep apnea (adult) (pediatric): Secondary | ICD-10-CM | POA: Diagnosis not present

## 2020-07-01 DIAGNOSIS — R600 Localized edema: Secondary | ICD-10-CM

## 2020-07-01 DIAGNOSIS — S4291XD Fracture of right shoulder girdle, part unspecified, subsequent encounter for fracture with routine healing: Secondary | ICD-10-CM | POA: Diagnosis not present

## 2020-07-01 DIAGNOSIS — I351 Nonrheumatic aortic (valve) insufficiency: Secondary | ICD-10-CM | POA: Diagnosis not present

## 2020-07-01 DIAGNOSIS — I5043 Acute on chronic combined systolic (congestive) and diastolic (congestive) heart failure: Secondary | ICD-10-CM | POA: Diagnosis not present

## 2020-07-01 DIAGNOSIS — K5732 Diverticulitis of large intestine without perforation or abscess without bleeding: Secondary | ICD-10-CM | POA: Diagnosis not present

## 2020-07-01 DIAGNOSIS — E871 Hypo-osmolality and hyponatremia: Secondary | ICD-10-CM | POA: Diagnosis not present

## 2020-07-01 DIAGNOSIS — I499 Cardiac arrhythmia, unspecified: Secondary | ICD-10-CM | POA: Diagnosis not present

## 2020-07-01 DIAGNOSIS — E049 Nontoxic goiter, unspecified: Secondary | ICD-10-CM | POA: Diagnosis not present

## 2020-07-01 MED ORDER — ALPRAZOLAM 0.25 MG PO TABS: 0.1250 mg | ORAL_TABLET | Freq: Every evening | ORAL | 0 refills | Status: DC | PRN

## 2020-07-01 NOTE — Telephone Encounter (Signed)
Home Health verbal orders-caller/Agency:  [place] number: [phone]  Requesting OT/PT/Skilled nursing/Social Work/Speech: PT  Reason: continuation   Frequency:  2w4

## 2020-07-01 NOTE — Telephone Encounter (Signed)
Agree with this. Thank you.  

## 2020-07-01 NOTE — Progress Notes (Signed)
Patient ID: [name], male    DOB: 1931-10-10, 87 y.o.   MRN: [id number]  This visit was conducted in person.  BP 106/68   Pulse (!) 51   Temp 97.9 F (36.6 C) (Temporal)   Ht 5\' 4"  (1.626 m)   Wt 139 lb (63 kg)   SpO2 97%   BMI 23.86 kg/m    CC: hosp f/u visit  Subjective:   HPI: [name] is a 87 y.o. male presenting on 07/01/2020 for Hospitalization Follow-up   Recent hospitalization for shortness of breath, found to have acute on chronic systolic CHF, from known nonischemic cardiomyopathy s/p AICD as well as pulmonary hypertension. Echocardiogram showed global hypokinesis with EF 20-25%, elevated pulm artery pressures, severely dilated LA, moderately dilated RA. Received IV lasix during hospitalization, discharged on oral torsemide 40mg  bid (new) with planned close outpatient cardiology follow up (07/13/2020) - torsemide has since been dropped to 20mg  bid. He also had new onset acute gout flare with elevated uric acid, treated with prednisone taper, started on allopurinol 100mg  daily. Planned outpatient follow up with rheumatology (Dr Jefm Bryant next week). Already limiting sodium in diet. He's started checking weights daily.   Chronic prednisone continued for chronic cough followed by pulm (Dr Lanney Gins).  Had recent ER visit earlier in the month for acute diverticulitis treated with augmentin but stopped this due to GI side effects. Decided not to continue antibiotic. Rpt CT scan didn't show diverticulitis.  Had previous ER visit 05/2020 after fall with fractured collarbone.   Increased fall risk noted, discharged with [facility] PT/OT. rec regularl use of \\cane .  Requests refill of xanax - he takes 1/2 tab at night regularly.    Date of admission: 06/17/2020 Date of discharge: 06/23/2020 TCM hosp f/u phone call completed on 06/23/2020  Discharge diagnosis: Principal Problem:   Shortness of breath Active Problems:   Anxiety   Atrial flutter (HCC)   GERD    Automatic implantable cardioverter-defibrillator in situ   Warfarin anticoagulation   Pulmonary HTN (HCC)   Aortic valve regurgitation   Chronic atrial fibrillation (HCC)   Hypervolemia   SOB (shortness of breath)   Acute on chronic HFrEF (heart failure with reduced ejection fraction) (HCC)   Pain  D/C Diet: cardiac diet Activity: as tolerated O2 - none D/C location: home, with [facility] PT[facility]OT     Relevant past medical, surgical, family and social history reviewed and updated as indicated. Interim medical history since our last visit reviewed. Allergies and medications reviewed and updated. Outpatient Medications Prior to Visit  Medication Sig Dispense Refill  . acetaminophen (TYLENOL) 500 MG tablet Take 250 mg by mouth daily as needed. Pain     . allopurinol (ZYLOPRIM) 100 MG tablet Take 1 tablet (100 mg total) by mouth daily. 30 tablet 0  . alum & mag hydroxide-simeth (MAALOX/MYLANTA) 200-200-20 MG/5ML suspension Take 15 mLs by mouth daily as needed for indigestion or heartburn.    . carvedilol (COREG) 6.25 MG tablet TAKE 1 AND 1/2 TABLETS BY MOUTH TWICE DAILY 270 tablet 1  . fluticasone (FLONASE) 50 MCG/ACT nasal spray Place 1 spray into both nostrils daily.    . montelukast (SINGULAIR) 10 MG tablet Take 10 mg by mouth at bedtime.    . polyethylene glycol (MIRALAX / GLYCOLAX) packet Take 17 g by mouth daily as needed (constipation).     . predniSONE (DELTASONE) 10 MG tablet Take 1 tablet (10 mg total) by mouth daily with breakfast.  ]  .  tamsulosin (FLOMAX) 0.4 MG CAPS capsule TAKE 1 CAPSULE BY MOUTH EVERY DAY (Patient taking differently: Take 0.4 mg by mouth at bedtime.) 90 capsule 2  . torsemide (DEMADEX) 20 MG tablet Take 20 mg by mouth 2 (two) times daily.    . vitamin B-12 (CYANOCOBALAMIN) 500 MCG tablet Take 2 tablets (1,000 mcg total) by mouth daily.    Marland Kitchen warfarin (COUMADIN) 5 MG tablet TAKE 1/2 TO 1 TABLET BY MOUTH DAILY AS DIRECTED BY COUMADIN CLINIC 90 tablet 2  . ALPRAZolam  (XANAX) 0.25 MG tablet TAKE 1 TABLET BY MOUTH EVERYDAY AT BEDTIME 30 tablet 0  . predniSONE (STERAPRED UNI-PAK 21 TAB) 10 MG (21) TBPK tablet Start 60 mg once daily, taper 10 mg daily until stays on 10 mg daily dose which is his chronic maintenance dose 21 tablet 0  . torsemide 40 MG TABS Take 40 mg by mouth 2 (two) times daily. 60 tablet 0   No facility-administered medications prior to visit.     Per HPI unless specifically indicated in ROS section below Review of Systems Objective:  BP 106/68   Pulse (!) 51   Temp 97.9 F (36.6 C) (Temporal)   Ht 5\' 4"  (1.626 m)   Wt 139 lb (63 kg)   SpO2 97%   BMI 23.86 kg/m   Wt Readings from Last 3 Encounters:  07/01/20 139 lb (63 kg)  06/23/20 141 lb 12.8 oz (64.3 kg)  06/14/20 142 lb (64.4 kg)      Physical Exam Vitals and nursing note reviewed.  Constitutional:      Appearance: Normal appearance. He is not ill-appearing.     Comments: Ambulates with cane  Cardiovascular:     Rate and Rhythm: Normal rate and regular rhythm.     Pulses: Normal pulses.     Heart sounds: Normal heart sounds. No murmur heard.   Pulmonary:     Effort: Pulmonary effort is normal. No respiratory distress.     Breath sounds: Normal breath sounds. No wheezing, rhonchi or rales.     Comments: No significant crackles Musculoskeletal:     Right lower leg: Edema (2+) present.     Left lower leg: Edema (1+) present.  Skin:    General: Skin is warm and dry.     Findings: No rash.  Neurological:     Mental Status: He is alert.  Psychiatric:        Mood and Affect: Mood normal.        Behavior: Behavior normal.       Results for orders placed or performed in visit on 06/29/20  POCT INR  Result Value Ref Range   INR 3.5 (A) 2.0 - 3.0   Lab Results  Component Value Date   VITAMINB12 829 11/16/2019    Lab Results  Component Value Date   WBC 7.4 06/23/2020   HGB 12.0 (L) 06/23/2020   HCT 35.7 (L) 06/23/2020   MCV 90.6 06/23/2020   PLT 146 (L)  06/23/2020    CT abd/pelvis with contrast IMPRESSION: 1. Acute uncomplicated sigmoid diverticulitis. 2. Small bilateral pleural effusions, left greater than right. 3. Moderate-large volume of stool throughout the colon. 4. Small fat containing left inguinal hernia. 5. Subtle contour irregularity of the liver, which could reflect underlying cirrhosis. 6. Similar appearance of narrowing of the origin of the celiac artery by the overlying diaphragmatic crura resulting in post-stenotic dilatation of the celiac trunk measuring 13 mm in diameter. Appearance raises the possibility of median arcuate ligament  syndrome. Mesenteric arterial Doppler study with inspiration-expiration could be performed if further evaluation is clinically warranted. This finding has been present on multiple previous studies dating back to 2010. 7. Cardiomegaly. 8. Aortic atherosclerosis (ICD10-I70.0). Electronically Signed By: Davina Poke D.O. On: 06/14/2020 13:21  CT abd pelvis without contrast IMPRESSION: 1. Small to moderate sized bilateral pleural effusions, right greater than left. 2. Mild bibasilar atelectasis and/or infiltrate, right greater than left. 3. Moderate severity cardiomegaly with a small pericardial effusion. 4. Colonic diverticulosis. 5. Large stool burden without evidence of bowel obstruction. 6. Small hiatal hernia. 7. Approximately 3 mm retrolisthesis of the L2 vertebral body on L3 with multilevel degenerative changes throughout the lumbar spine. 8. Aortic atherosclerosis. Electronically Signed   By: Virgina Norfolk M.D.   On: 06/17/2020 16:03 Assessment & Plan:  This visit occurred during the SARS-CoV-2 public health emergency.  Safety protocols were in place, including screening questions prior to the visit, additional usage of staff PPE, and extensive cleaning of exam room while observing appropriate contact time as indicated for disinfecting solutions.   Problem List Items  Addressed This Visit    Anxiety    Uses 1/2 tab xanax nightly for sleep.       Relevant Medications   ALPRAZolam (XANAX) 0.25 MG tablet   Secondary cardiomyopathy [facility])    Sees cardiology. Nonischemic CM leading to sCHF.       Relevant Medications   torsemide (DEMADEX) 20 MG tablet   Chronic systolic heart failure (HCC)   Relevant Medications   torsemide (DEMADEX) 20 MG tablet   Automatic implantable cardioverter-defibrillator in situ   Long term current use of anticoagulant   Chronic constipation    Noted on recent CT scans - discussed bowel regimen - miralax, fiber intake.      Pedal edema    Ongoing despite torsemide CHF related - continue to monitor.       Chronic atrial fibrillation (HCC)    Continue coumadin followed by cardiology clinic       Relevant Medications   torsemide (DEMADEX) 20 MG tablet   Acute on chronic HFrEF (heart failure with reduced ejection fraction) ([place]) - Primary    Doing well after recent hospitalization - now on torsemide 20mg  BID and diuresing well. Now limiting sodium in diet, has started daily weights. Has close cardiology f/u planned.       Relevant Medications   torsemide (DEMADEX) 20 MG tablet       Meds ordered this encounter  Medications  . ALPRAZolam (XANAX) 0.25 MG tablet    Sig: Take 0.5-1 tablets (0.125-0.25 mg total) by mouth at bedtime as needed for anxiety or sleep.    Dispense:  30 tablet    Refill:  0    Not to exceed 5 additional fills before 10/01/2020   No orders of the defined types were placed in this encounter.   Patient Instructions  Continue current medicines. Continue low sodium diet.  Good to see you today.  We have refilled your xanax.  Recent CT showed constipation. Continue miralax, good fiber intake (fruits). Ensure good water intake.   Follow up plan: Return if symptoms worsen or fail to improve.  Ria Bush, MD

## 2020-07-01 NOTE — Telephone Encounter (Signed)
Lvm for [place] (secured vm), informing him Dr. Darnell Level is giving verbal orders for services requested for the pt.

## 2020-07-01 NOTE — Patient Instructions (Addendum)
Continue current medicines. Continue low sodium diet.  Good to see you today.  We have refilled your xanax.  Recent CT showed constipation. Continue miralax, good fiber intake (fruits). Ensure good water intake.

## 2020-07-02 NOTE — Assessment & Plan Note (Addendum)
Continue coumadin followed by cardiology clinic

## 2020-07-02 NOTE — Assessment & Plan Note (Signed)
Ongoing despite torsemide CHF related - continue to monitor.

## 2020-07-02 NOTE — Assessment & Plan Note (Signed)
Uses 1/2 tab xanax nightly for sleep.

## 2020-07-02 NOTE — Assessment & Plan Note (Signed)
Doing well after recent hospitalization - now on torsemide 20mg  BID and diuresing well. Now limiting sodium in diet, has started daily weights. Has close cardiology f/u planned.

## 2020-07-02 NOTE — Assessment & Plan Note (Signed)
Sees cardiology. Nonischemic CM leading to sCHF.

## 2020-07-02 NOTE — Assessment & Plan Note (Signed)
Noted on recent CT scans - discussed bowel regimen - miralax, fiber intake.

## 2020-07-04 DIAGNOSIS — J329 Chronic sinusitis, unspecified: Secondary | ICD-10-CM

## 2020-07-04 DIAGNOSIS — E049 Nontoxic goiter, unspecified: Secondary | ICD-10-CM

## 2020-07-04 DIAGNOSIS — I351 Nonrheumatic aortic (valve) insufficiency: Secondary | ICD-10-CM | POA: Diagnosis not present

## 2020-07-04 DIAGNOSIS — K5732 Diverticulitis of large intestine without perforation or abscess without bleeding: Secondary | ICD-10-CM

## 2020-07-04 DIAGNOSIS — J309 Allergic rhinitis, unspecified: Secondary | ICD-10-CM

## 2020-07-04 DIAGNOSIS — K219 Gastro-esophageal reflux disease without esophagitis: Secondary | ICD-10-CM

## 2020-07-04 DIAGNOSIS — I483 Typical atrial flutter: Secondary | ICD-10-CM

## 2020-07-04 DIAGNOSIS — I499 Cardiac arrhythmia, unspecified: Secondary | ICD-10-CM | POA: Diagnosis not present

## 2020-07-04 DIAGNOSIS — I447 Left bundle-branch block, unspecified: Secondary | ICD-10-CM | POA: Diagnosis not present

## 2020-07-04 DIAGNOSIS — F419 Anxiety disorder, unspecified: Secondary | ICD-10-CM

## 2020-07-04 DIAGNOSIS — M1990 Unspecified osteoarthritis, unspecified site: Secondary | ICD-10-CM

## 2020-07-04 DIAGNOSIS — N39498 Other specified urinary incontinence: Secondary | ICD-10-CM

## 2020-07-04 DIAGNOSIS — I4892 Unspecified atrial flutter: Secondary | ICD-10-CM | POA: Diagnosis not present

## 2020-07-04 DIAGNOSIS — S4291XD Fracture of right shoulder girdle, part unspecified, subsequent encounter for fracture with routine healing: Secondary | ICD-10-CM

## 2020-07-04 DIAGNOSIS — J45909 Unspecified asthma, uncomplicated: Secondary | ICD-10-CM

## 2020-07-04 DIAGNOSIS — E871 Hypo-osmolality and hyponatremia: Secondary | ICD-10-CM

## 2020-07-04 DIAGNOSIS — J984 Other disorders of lung: Secondary | ICD-10-CM | POA: Diagnosis not present

## 2020-07-04 DIAGNOSIS — D649 Anemia, unspecified: Secondary | ICD-10-CM

## 2020-07-04 DIAGNOSIS — N401 Enlarged prostate with lower urinary tract symptoms: Secondary | ICD-10-CM

## 2020-07-04 DIAGNOSIS — M109 Gout, unspecified: Secondary | ICD-10-CM

## 2020-07-04 DIAGNOSIS — E878 Other disorders of electrolyte and fluid balance, not elsewhere classified: Secondary | ICD-10-CM

## 2020-07-04 DIAGNOSIS — I5043 Acute on chronic combined systolic (congestive) and diastolic (congestive) heart failure: Secondary | ICD-10-CM | POA: Diagnosis not present

## 2020-07-04 DIAGNOSIS — I728 Aneurysm of other specified arteries: Secondary | ICD-10-CM | POA: Diagnosis not present

## 2020-07-04 DIAGNOSIS — Z7901 Long term (current) use of anticoagulants: Secondary | ICD-10-CM

## 2020-07-04 DIAGNOSIS — I272 Pulmonary hypertension, unspecified: Secondary | ICD-10-CM | POA: Diagnosis not present

## 2020-07-04 DIAGNOSIS — Z9181 History of falling: Secondary | ICD-10-CM

## 2020-07-04 DIAGNOSIS — G4733 Obstructive sleep apnea (adult) (pediatric): Secondary | ICD-10-CM

## 2020-07-04 DIAGNOSIS — I482 Chronic atrial fibrillation, unspecified: Secondary | ICD-10-CM | POA: Diagnosis not present

## 2020-07-04 DIAGNOSIS — I110 Hypertensive heart disease with heart failure: Secondary | ICD-10-CM | POA: Diagnosis not present

## 2020-07-04 DIAGNOSIS — K589 Irritable bowel syndrome without diarrhea: Secondary | ICD-10-CM

## 2020-07-04 DIAGNOSIS — Z95810 Presence of automatic (implantable) cardiac defibrillator: Secondary | ICD-10-CM

## 2020-07-05 DIAGNOSIS — J45909 Unspecified asthma, uncomplicated: Secondary | ICD-10-CM | POA: Diagnosis not present

## 2020-07-05 DIAGNOSIS — E871 Hypo-osmolality and hyponatremia: Secondary | ICD-10-CM | POA: Diagnosis not present

## 2020-07-05 DIAGNOSIS — K5732 Diverticulitis of large intestine without perforation or abscess without bleeding: Secondary | ICD-10-CM | POA: Diagnosis not present

## 2020-07-05 DIAGNOSIS — I5043 Acute on chronic combined systolic (congestive) and diastolic (congestive) heart failure: Secondary | ICD-10-CM | POA: Diagnosis not present

## 2020-07-05 DIAGNOSIS — I499 Cardiac arrhythmia, unspecified: Secondary | ICD-10-CM | POA: Diagnosis not present

## 2020-07-05 DIAGNOSIS — I482 Chronic atrial fibrillation, unspecified: Secondary | ICD-10-CM | POA: Diagnosis not present

## 2020-07-05 DIAGNOSIS — K219 Gastro-esophageal reflux disease without esophagitis: Secondary | ICD-10-CM | POA: Diagnosis not present

## 2020-07-05 DIAGNOSIS — I447 Left bundle-branch block, unspecified: Secondary | ICD-10-CM | POA: Diagnosis not present

## 2020-07-05 DIAGNOSIS — D649 Anemia, unspecified: Secondary | ICD-10-CM | POA: Diagnosis not present

## 2020-07-05 DIAGNOSIS — G4733 Obstructive sleep apnea (adult) (pediatric): Secondary | ICD-10-CM | POA: Diagnosis not present

## 2020-07-05 DIAGNOSIS — M109 Gout, unspecified: Secondary | ICD-10-CM | POA: Diagnosis not present

## 2020-07-05 DIAGNOSIS — I351 Nonrheumatic aortic (valve) insufficiency: Secondary | ICD-10-CM | POA: Diagnosis not present

## 2020-07-05 DIAGNOSIS — S4291XD Fracture of right shoulder girdle, part unspecified, subsequent encounter for fracture with routine healing: Secondary | ICD-10-CM | POA: Diagnosis not present

## 2020-07-05 DIAGNOSIS — E049 Nontoxic goiter, unspecified: Secondary | ICD-10-CM | POA: Diagnosis not present

## 2020-07-05 DIAGNOSIS — J309 Allergic rhinitis, unspecified: Secondary | ICD-10-CM | POA: Diagnosis not present

## 2020-07-05 DIAGNOSIS — I4892 Unspecified atrial flutter: Secondary | ICD-10-CM | POA: Diagnosis not present

## 2020-07-05 DIAGNOSIS — I272 Pulmonary hypertension, unspecified: Secondary | ICD-10-CM | POA: Diagnosis not present

## 2020-07-05 DIAGNOSIS — J329 Chronic sinusitis, unspecified: Secondary | ICD-10-CM | POA: Diagnosis not present

## 2020-07-05 DIAGNOSIS — I110 Hypertensive heart disease with heart failure: Secondary | ICD-10-CM | POA: Diagnosis not present

## 2020-07-05 DIAGNOSIS — J984 Other disorders of lung: Secondary | ICD-10-CM | POA: Diagnosis not present

## 2020-07-05 DIAGNOSIS — E878 Other disorders of electrolyte and fluid balance, not elsewhere classified: Secondary | ICD-10-CM | POA: Diagnosis not present

## 2020-07-05 DIAGNOSIS — M1990 Unspecified osteoarthritis, unspecified site: Secondary | ICD-10-CM | POA: Diagnosis not present

## 2020-07-05 DIAGNOSIS — I728 Aneurysm of other specified arteries: Secondary | ICD-10-CM | POA: Diagnosis not present

## 2020-07-06 ENCOUNTER — Ambulatory Visit (INDEPENDENT_AMBULATORY_CARE_PROVIDER_SITE_OTHER): Payer: Medicare HMO | Admitting: Urology

## 2020-07-06 ENCOUNTER — Other Ambulatory Visit: Payer: Self-pay | Admitting: Cardiology

## 2020-07-06 ENCOUNTER — Other Ambulatory Visit: Payer: Self-pay

## 2020-07-06 DIAGNOSIS — I4892 Unspecified atrial flutter: Secondary | ICD-10-CM | POA: Diagnosis not present

## 2020-07-06 DIAGNOSIS — R351 Nocturia: Secondary | ICD-10-CM

## 2020-07-06 DIAGNOSIS — J309 Allergic rhinitis, unspecified: Secondary | ICD-10-CM | POA: Diagnosis not present

## 2020-07-06 DIAGNOSIS — I499 Cardiac arrhythmia, unspecified: Secondary | ICD-10-CM | POA: Diagnosis not present

## 2020-07-06 DIAGNOSIS — K5732 Diverticulitis of large intestine without perforation or abscess without bleeding: Secondary | ICD-10-CM | POA: Diagnosis not present

## 2020-07-06 DIAGNOSIS — I5043 Acute on chronic combined systolic (congestive) and diastolic (congestive) heart failure: Secondary | ICD-10-CM | POA: Diagnosis not present

## 2020-07-06 DIAGNOSIS — M109 Gout, unspecified: Secondary | ICD-10-CM | POA: Diagnosis not present

## 2020-07-06 DIAGNOSIS — E049 Nontoxic goiter, unspecified: Secondary | ICD-10-CM | POA: Diagnosis not present

## 2020-07-06 DIAGNOSIS — N401 Enlarged prostate with lower urinary tract symptoms: Secondary | ICD-10-CM | POA: Diagnosis not present

## 2020-07-06 DIAGNOSIS — I272 Pulmonary hypertension, unspecified: Secondary | ICD-10-CM | POA: Diagnosis not present

## 2020-07-06 DIAGNOSIS — E878 Other disorders of electrolyte and fluid balance, not elsewhere classified: Secondary | ICD-10-CM | POA: Diagnosis not present

## 2020-07-06 DIAGNOSIS — J45909 Unspecified asthma, uncomplicated: Secondary | ICD-10-CM | POA: Diagnosis not present

## 2020-07-06 DIAGNOSIS — J329 Chronic sinusitis, unspecified: Secondary | ICD-10-CM | POA: Diagnosis not present

## 2020-07-06 DIAGNOSIS — I728 Aneurysm of other specified arteries: Secondary | ICD-10-CM | POA: Diagnosis not present

## 2020-07-06 DIAGNOSIS — I110 Hypertensive heart disease with heart failure: Secondary | ICD-10-CM | POA: Diagnosis not present

## 2020-07-06 DIAGNOSIS — M1990 Unspecified osteoarthritis, unspecified site: Secondary | ICD-10-CM | POA: Diagnosis not present

## 2020-07-06 DIAGNOSIS — D649 Anemia, unspecified: Secondary | ICD-10-CM | POA: Diagnosis not present

## 2020-07-06 DIAGNOSIS — I447 Left bundle-branch block, unspecified: Secondary | ICD-10-CM | POA: Diagnosis not present

## 2020-07-06 DIAGNOSIS — G4733 Obstructive sleep apnea (adult) (pediatric): Secondary | ICD-10-CM | POA: Diagnosis not present

## 2020-07-06 DIAGNOSIS — I351 Nonrheumatic aortic (valve) insufficiency: Secondary | ICD-10-CM | POA: Diagnosis not present

## 2020-07-06 DIAGNOSIS — I482 Chronic atrial fibrillation, unspecified: Secondary | ICD-10-CM | POA: Diagnosis not present

## 2020-07-06 DIAGNOSIS — J984 Other disorders of lung: Secondary | ICD-10-CM | POA: Diagnosis not present

## 2020-07-06 DIAGNOSIS — K219 Gastro-esophageal reflux disease without esophagitis: Secondary | ICD-10-CM | POA: Diagnosis not present

## 2020-07-06 DIAGNOSIS — S4291XD Fracture of right shoulder girdle, part unspecified, subsequent encounter for fracture with routine healing: Secondary | ICD-10-CM | POA: Diagnosis not present

## 2020-07-06 DIAGNOSIS — E871 Hypo-osmolality and hyponatremia: Secondary | ICD-10-CM | POA: Diagnosis not present

## 2020-07-06 LAB — BLADDER SCAN AMB NON-IMAGING

## 2020-07-06 MED ORDER — TAMSULOSIN HCL 0.4 MG PO CAPS: 0.8000 mg | ORAL_CAPSULE | Freq: Every day | ORAL | 7 refills | Status: AC

## 2020-07-06 NOTE — Progress Notes (Signed)
   07/06/2020 12:29 PM   Maddon Horton Beadnell 31-May-1931 [id number]  Reason for visit: Follow up BPH  HPI: I saw Mr. Rotundo in clinic today for follow-up of BPH.  He is a comorbid 87 year old male with CAD, CHF, high-dose diuretics, recent hospitalizations for fluid overload, and BPH on Flomax.  His diuretic was recently changed to torsemide twice daily.  He is having some weak stream and urinary frequency, but overall is doing well.  PVR is normal at 18 mL today.  He is wondering if he can increase the dose of the Flomax.  He was recently seen in the ED for abdominal pain and a CT was essentially benign aside from some mild diverticulitis.  I personally viewed and interpreted the images that showed no hydronephrosis and a normal prostate volume of 20 g.  Okay to increase Flomax to 0.8 mg, cautioned about lightheadedness or dizziness.  We reviewed return precautions at length including worsening urinary symptoms, urinary retention, UTIs, gross hematuria.  Increase Flomax to 0.8 mg nightly RTC 1 year PVR and symptom check  Billey Co, MD  [facility] [address], [place], [place] [address] [phone]

## 2020-07-06 NOTE — Patient Instructions (Signed)

## 2020-07-07 DIAGNOSIS — N183 Chronic kidney disease, stage 3 unspecified: Secondary | ICD-10-CM | POA: Diagnosis not present

## 2020-07-07 DIAGNOSIS — D649 Anemia, unspecified: Secondary | ICD-10-CM | POA: Diagnosis not present

## 2020-07-07 DIAGNOSIS — I110 Hypertensive heart disease with heart failure: Secondary | ICD-10-CM | POA: Diagnosis not present

## 2020-07-07 DIAGNOSIS — I482 Chronic atrial fibrillation, unspecified: Secondary | ICD-10-CM | POA: Diagnosis not present

## 2020-07-07 DIAGNOSIS — E871 Hypo-osmolality and hyponatremia: Secondary | ICD-10-CM | POA: Diagnosis not present

## 2020-07-07 DIAGNOSIS — G4733 Obstructive sleep apnea (adult) (pediatric): Secondary | ICD-10-CM | POA: Diagnosis not present

## 2020-07-07 DIAGNOSIS — M8000XA Age-related osteoporosis with current pathological fracture, unspecified site, initial encounter for fracture: Secondary | ICD-10-CM | POA: Diagnosis not present

## 2020-07-07 DIAGNOSIS — K219 Gastro-esophageal reflux disease without esophagitis: Secondary | ICD-10-CM | POA: Diagnosis not present

## 2020-07-07 DIAGNOSIS — S4291XD Fracture of right shoulder girdle, part unspecified, subsequent encounter for fracture with routine healing: Secondary | ICD-10-CM | POA: Diagnosis not present

## 2020-07-07 DIAGNOSIS — M109 Gout, unspecified: Secondary | ICD-10-CM | POA: Diagnosis not present

## 2020-07-07 DIAGNOSIS — I5043 Acute on chronic combined systolic (congestive) and diastolic (congestive) heart failure: Secondary | ICD-10-CM | POA: Diagnosis not present

## 2020-07-07 DIAGNOSIS — I272 Pulmonary hypertension, unspecified: Secondary | ICD-10-CM | POA: Diagnosis not present

## 2020-07-07 DIAGNOSIS — J45909 Unspecified asthma, uncomplicated: Secondary | ICD-10-CM | POA: Diagnosis not present

## 2020-07-07 DIAGNOSIS — M1990 Unspecified osteoarthritis, unspecified site: Secondary | ICD-10-CM | POA: Diagnosis not present

## 2020-07-07 DIAGNOSIS — J309 Allergic rhinitis, unspecified: Secondary | ICD-10-CM | POA: Diagnosis not present

## 2020-07-07 DIAGNOSIS — I447 Left bundle-branch block, unspecified: Secondary | ICD-10-CM | POA: Diagnosis not present

## 2020-07-07 DIAGNOSIS — J984 Other disorders of lung: Secondary | ICD-10-CM | POA: Diagnosis not present

## 2020-07-07 DIAGNOSIS — E049 Nontoxic goiter, unspecified: Secondary | ICD-10-CM | POA: Diagnosis not present

## 2020-07-07 DIAGNOSIS — K5732 Diverticulitis of large intestine without perforation or abscess without bleeding: Secondary | ICD-10-CM | POA: Diagnosis not present

## 2020-07-07 DIAGNOSIS — I5022 Chronic systolic (congestive) heart failure: Secondary | ICD-10-CM | POA: Diagnosis not present

## 2020-07-07 DIAGNOSIS — I499 Cardiac arrhythmia, unspecified: Secondary | ICD-10-CM | POA: Diagnosis not present

## 2020-07-07 DIAGNOSIS — I728 Aneurysm of other specified arteries: Secondary | ICD-10-CM | POA: Diagnosis not present

## 2020-07-07 DIAGNOSIS — J329 Chronic sinusitis, unspecified: Secondary | ICD-10-CM | POA: Diagnosis not present

## 2020-07-07 DIAGNOSIS — E878 Other disorders of electrolyte and fluid balance, not elsewhere classified: Secondary | ICD-10-CM | POA: Diagnosis not present

## 2020-07-07 DIAGNOSIS — I4892 Unspecified atrial flutter: Secondary | ICD-10-CM | POA: Diagnosis not present

## 2020-07-07 DIAGNOSIS — J42 Unspecified chronic bronchitis: Secondary | ICD-10-CM | POA: Diagnosis not present

## 2020-07-07 DIAGNOSIS — I351 Nonrheumatic aortic (valve) insufficiency: Secondary | ICD-10-CM | POA: Diagnosis not present

## 2020-07-07 DIAGNOSIS — M1A00X0 Idiopathic chronic gout, unspecified site, without tophus (tophi): Secondary | ICD-10-CM | POA: Diagnosis not present

## 2020-07-08 ENCOUNTER — Telehealth: Payer: Self-pay

## 2020-07-08 ENCOUNTER — Telehealth: Payer: Self-pay | Admitting: Cardiology

## 2020-07-08 DIAGNOSIS — I499 Cardiac arrhythmia, unspecified: Secondary | ICD-10-CM | POA: Diagnosis not present

## 2020-07-08 DIAGNOSIS — I351 Nonrheumatic aortic (valve) insufficiency: Secondary | ICD-10-CM | POA: Diagnosis not present

## 2020-07-08 DIAGNOSIS — J329 Chronic sinusitis, unspecified: Secondary | ICD-10-CM | POA: Diagnosis not present

## 2020-07-08 DIAGNOSIS — E871 Hypo-osmolality and hyponatremia: Secondary | ICD-10-CM | POA: Diagnosis not present

## 2020-07-08 DIAGNOSIS — K5732 Diverticulitis of large intestine without perforation or abscess without bleeding: Secondary | ICD-10-CM | POA: Diagnosis not present

## 2020-07-08 DIAGNOSIS — I5043 Acute on chronic combined systolic (congestive) and diastolic (congestive) heart failure: Secondary | ICD-10-CM | POA: Diagnosis not present

## 2020-07-08 DIAGNOSIS — J309 Allergic rhinitis, unspecified: Secondary | ICD-10-CM | POA: Diagnosis not present

## 2020-07-08 DIAGNOSIS — S4291XD Fracture of right shoulder girdle, part unspecified, subsequent encounter for fracture with routine healing: Secondary | ICD-10-CM | POA: Diagnosis not present

## 2020-07-08 DIAGNOSIS — I110 Hypertensive heart disease with heart failure: Secondary | ICD-10-CM | POA: Diagnosis not present

## 2020-07-08 DIAGNOSIS — G4733 Obstructive sleep apnea (adult) (pediatric): Secondary | ICD-10-CM | POA: Diagnosis not present

## 2020-07-08 DIAGNOSIS — I272 Pulmonary hypertension, unspecified: Secondary | ICD-10-CM | POA: Diagnosis not present

## 2020-07-08 DIAGNOSIS — E049 Nontoxic goiter, unspecified: Secondary | ICD-10-CM | POA: Diagnosis not present

## 2020-07-08 DIAGNOSIS — I447 Left bundle-branch block, unspecified: Secondary | ICD-10-CM | POA: Diagnosis not present

## 2020-07-08 DIAGNOSIS — E878 Other disorders of electrolyte and fluid balance, not elsewhere classified: Secondary | ICD-10-CM | POA: Diagnosis not present

## 2020-07-08 DIAGNOSIS — D649 Anemia, unspecified: Secondary | ICD-10-CM | POA: Diagnosis not present

## 2020-07-08 DIAGNOSIS — J984 Other disorders of lung: Secondary | ICD-10-CM | POA: Diagnosis not present

## 2020-07-08 DIAGNOSIS — I728 Aneurysm of other specified arteries: Secondary | ICD-10-CM | POA: Diagnosis not present

## 2020-07-08 DIAGNOSIS — I4892 Unspecified atrial flutter: Secondary | ICD-10-CM | POA: Diagnosis not present

## 2020-07-08 DIAGNOSIS — M109 Gout, unspecified: Secondary | ICD-10-CM | POA: Diagnosis not present

## 2020-07-08 DIAGNOSIS — J45909 Unspecified asthma, uncomplicated: Secondary | ICD-10-CM | POA: Diagnosis not present

## 2020-07-08 DIAGNOSIS — K219 Gastro-esophageal reflux disease without esophagitis: Secondary | ICD-10-CM | POA: Diagnosis not present

## 2020-07-08 DIAGNOSIS — I482 Chronic atrial fibrillation, unspecified: Secondary | ICD-10-CM | POA: Diagnosis not present

## 2020-07-08 DIAGNOSIS — M1990 Unspecified osteoarthritis, unspecified site: Secondary | ICD-10-CM | POA: Diagnosis not present

## 2020-07-08 NOTE — Telephone Encounter (Signed)
Called patients son Octavia Bruckner in response to my chart message. Expressed to pt son we can have pt come in to evaluate vitals and PVR. Pt son declined at this point and was reaching out to patients cardiologist in regards to his other symptoms and medical history

## 2020-07-08 NOTE — Telephone Encounter (Signed)
Spoke with [name] informing her Dr. G is giving verbal orders for services requested.  

## 2020-07-08 NOTE — Telephone Encounter (Signed)
Called patient advised of mychart message from Dr.Hochrein regarding dose of torsemide, spoke with him in regards to this. Patient states since taking the medication like this he has started going to the bathroom a little bit better. Patient states he will do the 2 in the morning and 1 at night over the weekend and see if it gets better.   Patient states his arms and legs can be tingling at times, I advised this could be nerve related, patient states it is not painful, or go numb just has some tingling. He mentions that it feels like water running down his legs and then it goes away.   Patient is aware of on call service over the weekend if needed or other concerns.   Will route to MD to make aware.

## 2020-07-08 NOTE — Telephone Encounter (Signed)
Connie with [facility] called requesting VO for OT 1 x a week for 5 weeks

## 2020-07-08 NOTE — Telephone Encounter (Signed)
Patient's son called to say that the patient call him to say that he has tingling in his left arm and the tingling also in his feet and legs. He says when he stands he feels like fluid is running down his leg. Patient also states he is having a hard time peeing. Patient's son is not sure if the new meds have anything to do with it or rather his father is taken the medication correctly

## 2020-07-08 NOTE — Telephone Encounter (Signed)
Agree with this. Thank you.  

## 2020-07-11 DIAGNOSIS — S42001A Fracture of unspecified part of right clavicle, initial encounter for closed fracture: Secondary | ICD-10-CM | POA: Diagnosis not present

## 2020-07-12 DIAGNOSIS — G4733 Obstructive sleep apnea (adult) (pediatric): Secondary | ICD-10-CM | POA: Diagnosis not present

## 2020-07-12 DIAGNOSIS — I272 Pulmonary hypertension, unspecified: Secondary | ICD-10-CM | POA: Diagnosis not present

## 2020-07-12 DIAGNOSIS — E878 Other disorders of electrolyte and fluid balance, not elsewhere classified: Secondary | ICD-10-CM | POA: Diagnosis not present

## 2020-07-12 DIAGNOSIS — I728 Aneurysm of other specified arteries: Secondary | ICD-10-CM | POA: Diagnosis not present

## 2020-07-12 DIAGNOSIS — S4291XD Fracture of right shoulder girdle, part unspecified, subsequent encounter for fracture with routine healing: Secondary | ICD-10-CM | POA: Diagnosis not present

## 2020-07-12 DIAGNOSIS — J329 Chronic sinusitis, unspecified: Secondary | ICD-10-CM | POA: Diagnosis not present

## 2020-07-12 DIAGNOSIS — M109 Gout, unspecified: Secondary | ICD-10-CM | POA: Diagnosis not present

## 2020-07-12 DIAGNOSIS — I5043 Acute on chronic combined systolic (congestive) and diastolic (congestive) heart failure: Secondary | ICD-10-CM | POA: Diagnosis not present

## 2020-07-12 DIAGNOSIS — I4892 Unspecified atrial flutter: Secondary | ICD-10-CM | POA: Diagnosis not present

## 2020-07-12 DIAGNOSIS — I447 Left bundle-branch block, unspecified: Secondary | ICD-10-CM | POA: Diagnosis not present

## 2020-07-12 DIAGNOSIS — J309 Allergic rhinitis, unspecified: Secondary | ICD-10-CM | POA: Diagnosis not present

## 2020-07-12 DIAGNOSIS — D649 Anemia, unspecified: Secondary | ICD-10-CM | POA: Diagnosis not present

## 2020-07-12 DIAGNOSIS — E049 Nontoxic goiter, unspecified: Secondary | ICD-10-CM | POA: Diagnosis not present

## 2020-07-12 DIAGNOSIS — I110 Hypertensive heart disease with heart failure: Secondary | ICD-10-CM | POA: Diagnosis not present

## 2020-07-12 DIAGNOSIS — I499 Cardiac arrhythmia, unspecified: Secondary | ICD-10-CM | POA: Diagnosis not present

## 2020-07-12 DIAGNOSIS — E871 Hypo-osmolality and hyponatremia: Secondary | ICD-10-CM | POA: Diagnosis not present

## 2020-07-12 DIAGNOSIS — I351 Nonrheumatic aortic (valve) insufficiency: Secondary | ICD-10-CM | POA: Diagnosis not present

## 2020-07-12 DIAGNOSIS — K5732 Diverticulitis of large intestine without perforation or abscess without bleeding: Secondary | ICD-10-CM | POA: Diagnosis not present

## 2020-07-12 DIAGNOSIS — J984 Other disorders of lung: Secondary | ICD-10-CM | POA: Diagnosis not present

## 2020-07-12 DIAGNOSIS — I482 Chronic atrial fibrillation, unspecified: Secondary | ICD-10-CM | POA: Diagnosis not present

## 2020-07-12 DIAGNOSIS — K219 Gastro-esophageal reflux disease without esophagitis: Secondary | ICD-10-CM | POA: Diagnosis not present

## 2020-07-12 DIAGNOSIS — M1990 Unspecified osteoarthritis, unspecified site: Secondary | ICD-10-CM | POA: Diagnosis not present

## 2020-07-12 DIAGNOSIS — J45909 Unspecified asthma, uncomplicated: Secondary | ICD-10-CM | POA: Diagnosis not present

## 2020-07-12 NOTE — Progress Notes (Addendum)
Cardiology Office Note   Date:  07/13/2020   ID:  [name], DOB June 24, 1931, MRN [id number]  PCP:  Ria Bush, MD  Cardiologist:   [name], MD   Chief Complaint  Patient presents with  . Edema      History of Present Illness: [name] is a 87 y.o. male who presents for follow up of his cardiomyopathy and atrial flutter.  EF on the last echo EF was 20 - 25%.     He was in the hospital at [facility] with acute on chronic HF.  He was discharged on Torsemide 40 mg bid.  However he had too much weight loss.  He went to 20 mg bid and had decreased UO.  We talked to him and landed on 40 mg AM and 20 six hours later.  He returns for follow up.  His weight at discharge was around 134 - 136 pounds.  He explained hovering around 154-156.  Today he feels good.  He does have some lower extremity swelling after his been up for a little while.  It goes down but not completely away when he sleeps.  He is not having any chest pain, neck or arm discomfort.  He does not really feel any palpitations, presyncope or syncope.  His son who is with him thinks he is doing better and they can tell when he is chatty and more animated.    Past Medical History:  Diagnosis Date  . AICD (automatic cardioverter/defibrillator) present    s/p gen change 04/2015 w/ MDT [name] Crt-D  DTBA1D1, Serial number [id number]  . Allergic rhinitis    [name]  . Anemia   . Anxiety   . Arthritis   . Atrial flutter ([place])    A.  Status post cardioversion; B.  Tikosyn therapy - failed, remains in aflutter  . Bilateral pneumonia 11/02/2013   treated with levaquin  . BPH (benign prostatic hyperplasia)   . 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 Lucky Cowboy)  . Chronic lung disease    spirometry 2015 - no obstruction, + mild restrictive lung disease  . Chronic sinusitis   . Chronic systolic congestive heart failure ([place])   . Dyspnea    with exertion  . Extrinsic asthma     [name]  . Fall with injury 05/28/2017  . GERD (gastroesophageal reflux disease) 2010   h/o esophageal stricture with dilation, LA grade C reflux esophagitis by EGD 2010  . Goiter   . History of diverticulitis of colon 06/2020  . History of GI bleed    Secondary to hemorrhoids  . History of seasonal allergies   . Hypertension   . IBS (irritable bowel syndrome)   . LBBB (left bundle branch block)   . Multiple pulmonary nodules 06/2013   RUL ([facility] Adam Phenix [facility]) - ?vasculitis as of last CT at [facility]  . NICM (nonischemic cardiomyopathy) [facility])    Cardiac catheterization March 2006 without coronary disease; EF 25% 2018 Jan  . Orthopnea   . Porphyria ([place])   . Presence of permanent cardiac pacemaker   . Sinus congestion 09/25/2010    Past Surgical History:  Procedure Laterality Date  . BACK SURGERY  1997   bulging disks  . BiV-AICD implant     [employer]  . CARDIAC CATHETERIZATION  06/22/04   Severe, nonischemic cardiomyopathy,EF 25-30%  . CARDIOVERSION  02/22/09   AFlutter-([facility])  . CATARACT EXTRACTION W/PHACO  Right 12/21/2019   Procedure: CATARACT EXTRACTION PHACO AND INTRAOCULAR LENS PLACEMENT (IOC) RIGHT 2.23  00:21.9;  Surgeon: Eulogio Bear, MD;  Location: [place];  Service: Ophthalmology;  Laterality: Right;  . CATARACT EXTRACTION W/PHACO Left 02/01/2020   Procedure: CATARACT EXTRACTION PHACO AND INTRAOCULAR LENS PLACEMENT (IOC) LEFT 3.67 00:39.5;  Surgeon: Eulogio Bear, MD;  Location: [place];  Service: Ophthalmology;  Laterality: Left;  . CHOLECYSTECTOMY    . COLONOSCOPY  10/99   Divertic, splenic, hepatic fleure only  . COLONOSCOPY  10/21/08   aborted-divertics, int hemms (Dr. Vira Agar)  . CORONARY ANGIOPLASTY    . EP IMPLANTABLE DEVICE N/A 04/25/2015   Procedure: BIV ICD Generator Changeout;  Surgeon: Evans Lance, MD;  Location: [place] [facility];  Service: Cardiovascular;  Laterality: N/A;  . ESOPHAGEAL DILATION  02/18/98   . ESOPHAGOGASTRODUODENOSCOPY  01/1998   stricture, sliding HH, GERD  . ESOPHAGOGASTRODUODENOSCOPY  04/08/01   stricture, gastritis, HH, GERD-no dilation(Dr. Henrene Pastor)  . ESOPHAGOGASTRODUODENOSCOPY  12/22/04   stricture; gastritis; duodenitis, GERD  . ESOPHAGOGASTRODUODENOSCOPY  10/21/08   Reflux esophagitis; Erythem. Duod. (Dr. Vira Agar)  . ESOPHAGOGASTRODUODENOSCOPY  08/2013   erythema gastric fundus - minimal gastritis, HH ([place])  . ESOPHAGOGASTRODUODENOSCOPY  08/2016   dysphagia - mod schatzki rin, dilated Tiffany Kocher)  . ESOPHAGOGASTRODUODENOSCOPY (EGD) WITH PROPOFOL N/A 08/24/2016   mod schatzki ring dilated, duodenal deformity Vira Agar, Gavin Pound, MD)  . goiter removal    . HERNIA REPAIR    . HERNIA REPAIR  01/30/02   Dr. Hassell Done  . INSERT / REPLACE / REMOVE PACEMAKER  2007 and 2017  . JOINT REPLACEMENT Right    shoulder  . NOSE SURGERY  1971   sinus surgery  . PENILE PROSTHESIS IMPLANT  2007   [place]  . PFT  10/2013   FVC 61%, FEV1 63%, ratio 0.76  . Pulmonary eval  04/2002   ([facility]) Chronic congestive symptoms  . REVERSE SHOULDER ARTHROPLASTY Right 01/17/2016   Procedure: REVERSE SHOULDER ARTHROPLASTY;  Surgeon: Corky Mull, MD;  Location: [facility];  Service: Orthopedics;  Laterality: Right;  . REVERSE TOTAL SHOULDER ARTHROPLASTY Right 01/2016   Poggi  . US ECHOCARDIOGRAPHY  07/13/04   EF 25-30%, Mod LVH; LA severe dilation; Mild AR; IRTR  . US ECHOCARDIOGRAPHY  11/27/06   hypokinesis posterior wall, EF 35%; mild AR     Current Outpatient Medications  Medication Sig Dispense Refill  . acetaminophen (TYLENOL) 500 MG tablet Take 250 mg by mouth daily as needed. Pain     . allopurinol (ZYLOPRIM) 100 MG tablet Take 1 tablet (100 mg total) by mouth daily. 30 tablet 0  . ALPRAZolam (XANAX) 0.25 MG tablet Take 0.5-1 tablets (0.125-0.25 mg total) by mouth at bedtime as needed for anxiety or sleep. 30 tablet 0  . alum & mag hydroxide-simeth (MAALOX/MYLANTA) 200-200-20 MG/5ML suspension Take  15 mLs by mouth daily as needed for indigestion or heartburn.    . carvedilol (COREG) 6.25 MG tablet TAKE 1 AND 1/2 TABLETS BY MOUTH TWICE DAILY 270 tablet 1  . fluticasone (FLONASE) 50 MCG/ACT nasal spray Place 1 spray into both nostrils daily.    . furosemide (LASIX) 40 MG tablet TAKE 1 AND 1/2 TABLETS (60 MG TOTAL) BY MOUTH 2 (TWO) TIMES DAILY. 140 tablet 3  . montelukast (SINGULAIR) 10 MG tablet Take 10 mg by mouth at bedtime.    . polyethylene glycol (MIRALAX / GLYCOLAX) packet Take 17 g by mouth daily as needed (constipation).     Marland Kitchen  predniSONE (DELTASONE) 10 MG tablet Take 1 tablet (10 mg total) by mouth daily with breakfast.  ]  . tamsulosin (FLOMAX) 0.4 MG CAPS capsule Take 2 capsules (0.8 mg total) by mouth daily after supper. 90 capsule 7  . torsemide (DEMADEX) 20 MG tablet Take 20 mg by mouth 2 (two) times daily.    . vitamin B-12 (CYANOCOBALAMIN) 500 MCG tablet Take 2 tablets (1,000 mcg total) by mouth daily.    Marland Kitchen warfarin (COUMADIN) 5 MG tablet TAKE 1/2 TO 1 TABLET BY MOUTH DAILY AS DIRECTED BY COUMADIN CLINIC 90 tablet 2   No current facility-administered medications for this visit.    Allergies:   Carafate [sucralfate], Clarithromycin, Famotidine, Levaquin [levofloxacin], Omeprazole, Oxytetracycline, Penicillins, Proton pump inhibitors, Ranitidine, and Doxycycline   ROS:  Please see the history of present illness.   Otherwise, review of systems are positive for none.  All other systems are reviewed and negative.    PHYSICAL EXAM: VS:  BP 118/62   Pulse 64   Ht 5\' 4"  (1.626 m)   Wt 138 lb 12.8 oz (63 kg)   SpO2 99%   BMI 23.82 kg/m  , BMI Body mass index is 23.82 kg/m. GENERAL:  Well appearing NECK:  No jugular venous distention, waveform within normal limits, carotid upstroke brisk and symmetric, no bruits, no thyromegaly LUNGS:  Clear to auscultation bilaterally CHEST:  Unremarkable HEART:  PMI not displaced or sustained,S1 and S2 within normal limits, no S3,  no  clicks, no rubs, no murmurs ABD:  Flat, positive bowel sounds normal in frequency in pitch, no bruits, no rebound, no guarding, no midline pulsatile mass, no hepatomegaly, no splenomegaly, irregular EXT:  2 plus pulses throughout, mild bilateral lower extremity edema, no cyanosis no clubbing   EKG:  EKG is  ordered today. Atrial flutter, ventricular pacing, premature ventricular contractions  Recent Labs: 06/17/2020: ALT 24 06/19/2020: B Natriuretic Peptide 2,034.7 06/23/2020: Hemoglobin 12.0; Platelets 146 06/29/2020: BUN 37; Creatinine, Ser 1.32; Potassium 5.0; Sodium 139    Lipid Panel    Component Value Date/Time   CHOL 183 11/06/2018 1206   CHOL 115 09/01/2013 0507   TRIG 130.0 11/06/2018 1206   TRIG 120 09/01/2013 0507   HDL 49.50 11/06/2018 1206   CHOLHDL 4 11/06/2018 1206   VLDL 26.0 11/06/2018 1206   LDLCALC 107 (H) 11/06/2018 1206      Wt Readings from Last 3 Encounters:  07/13/20 138 lb 12.8 oz (63 kg)  07/01/20 139 lb (63 kg)  06/23/20 141 lb 12.8 oz (64.3 kg)      Other studies Reviewed: Additional studies/ records that were reviewed today include: Hospital records. Review of the above records demonstrates: See elsewhere  ASSESSMENT AND PLAN:  Acute on chronic systolic HF -  Today I think he is euvolemic.  We clarified his meds.  I reviewed this with him and I will check a basic metabolic profile.   Atrial flutter -   [name] has a CHA2DS2 - VASc score of 3.  He tolerates anticoagulation.  He does not want to consider a DOAC.   Pulmonary HTN - He understands he has pulmonary hypertension.  It is severe.  However, we agreed that he will be treated conservatively.    ICD:  He is up-to-date with follow-up.    Current medicines are reviewed at length with the patient today.  The patient does not have concerns regarding medicines.  The following changes have been made:  None  Labs/ tests  ordered today include:  None Orders Placed This  Encounter  Procedures  . Basic metabolic panel  . EKG 12-Lead     Disposition:   FU with with me or APP in six weeks.   Signed, [name], MD  07/13/2020 9:56 AM    [facility]

## 2020-07-13 ENCOUNTER — Other Ambulatory Visit: Payer: Self-pay

## 2020-07-13 ENCOUNTER — Encounter: Payer: Self-pay | Admitting: Cardiology

## 2020-07-13 ENCOUNTER — Ambulatory Visit (INDEPENDENT_AMBULATORY_CARE_PROVIDER_SITE_OTHER): Payer: Medicare HMO

## 2020-07-13 ENCOUNTER — Ambulatory Visit: Payer: Medicare HMO | Admitting: Cardiology

## 2020-07-13 VITALS — BP 118/62 | HR 64 | Ht 64.0 in | Wt 138.8 lb

## 2020-07-13 DIAGNOSIS — J984 Other disorders of lung: Secondary | ICD-10-CM | POA: Diagnosis not present

## 2020-07-13 DIAGNOSIS — K219 Gastro-esophageal reflux disease without esophagitis: Secondary | ICD-10-CM | POA: Diagnosis not present

## 2020-07-13 DIAGNOSIS — S4291XD Fracture of right shoulder girdle, part unspecified, subsequent encounter for fracture with routine healing: Secondary | ICD-10-CM | POA: Diagnosis not present

## 2020-07-13 DIAGNOSIS — G4733 Obstructive sleep apnea (adult) (pediatric): Secondary | ICD-10-CM | POA: Diagnosis not present

## 2020-07-13 DIAGNOSIS — M109 Gout, unspecified: Secondary | ICD-10-CM | POA: Diagnosis not present

## 2020-07-13 DIAGNOSIS — I4892 Unspecified atrial flutter: Secondary | ICD-10-CM | POA: Diagnosis not present

## 2020-07-13 DIAGNOSIS — J309 Allergic rhinitis, unspecified: Secondary | ICD-10-CM | POA: Diagnosis not present

## 2020-07-13 DIAGNOSIS — J45909 Unspecified asthma, uncomplicated: Secondary | ICD-10-CM | POA: Diagnosis not present

## 2020-07-13 DIAGNOSIS — K5732 Diverticulitis of large intestine without perforation or abscess without bleeding: Secondary | ICD-10-CM | POA: Diagnosis not present

## 2020-07-13 DIAGNOSIS — I272 Pulmonary hypertension, unspecified: Secondary | ICD-10-CM | POA: Diagnosis not present

## 2020-07-13 DIAGNOSIS — I447 Left bundle-branch block, unspecified: Secondary | ICD-10-CM | POA: Diagnosis not present

## 2020-07-13 DIAGNOSIS — I351 Nonrheumatic aortic (valve) insufficiency: Secondary | ICD-10-CM | POA: Diagnosis not present

## 2020-07-13 DIAGNOSIS — Z5181 Encounter for therapeutic drug level monitoring: Secondary | ICD-10-CM | POA: Diagnosis not present

## 2020-07-13 DIAGNOSIS — D649 Anemia, unspecified: Secondary | ICD-10-CM | POA: Diagnosis not present

## 2020-07-13 DIAGNOSIS — Z7901 Long term (current) use of anticoagulants: Secondary | ICD-10-CM | POA: Diagnosis not present

## 2020-07-13 DIAGNOSIS — I499 Cardiac arrhythmia, unspecified: Secondary | ICD-10-CM | POA: Diagnosis not present

## 2020-07-13 DIAGNOSIS — I483 Typical atrial flutter: Secondary | ICD-10-CM

## 2020-07-13 DIAGNOSIS — I110 Hypertensive heart disease with heart failure: Secondary | ICD-10-CM | POA: Diagnosis not present

## 2020-07-13 DIAGNOSIS — E871 Hypo-osmolality and hyponatremia: Secondary | ICD-10-CM | POA: Diagnosis not present

## 2020-07-13 DIAGNOSIS — M1990 Unspecified osteoarthritis, unspecified site: Secondary | ICD-10-CM | POA: Diagnosis not present

## 2020-07-13 DIAGNOSIS — I728 Aneurysm of other specified arteries: Secondary | ICD-10-CM | POA: Diagnosis not present

## 2020-07-13 DIAGNOSIS — I5043 Acute on chronic combined systolic (congestive) and diastolic (congestive) heart failure: Secondary | ICD-10-CM | POA: Diagnosis not present

## 2020-07-13 DIAGNOSIS — E878 Other disorders of electrolyte and fluid balance, not elsewhere classified: Secondary | ICD-10-CM | POA: Diagnosis not present

## 2020-07-13 DIAGNOSIS — J329 Chronic sinusitis, unspecified: Secondary | ICD-10-CM | POA: Diagnosis not present

## 2020-07-13 DIAGNOSIS — E049 Nontoxic goiter, unspecified: Secondary | ICD-10-CM | POA: Diagnosis not present

## 2020-07-13 DIAGNOSIS — I5021 Acute systolic (congestive) heart failure: Secondary | ICD-10-CM

## 2020-07-13 DIAGNOSIS — I482 Chronic atrial fibrillation, unspecified: Secondary | ICD-10-CM | POA: Diagnosis not present

## 2020-07-13 LAB — BASIC METABOLIC PANEL
BUN/Creatinine Ratio: 18 (ref 10–24)
BUN: 24 mg/dL (ref 8–27)
CO2: 25 mmol/L (ref 20–29)
Calcium: 8.8 mg/dL (ref 8.6–10.2)
Chloride: 94 mmol/L — ABNORMAL LOW (ref 96–106)
Creatinine, Ser: 1.31 mg/dL — ABNORMAL HIGH (ref 0.76–1.27)
Glucose: 87 mg/dL (ref 65–99)
Potassium: 4.2 mmol/L (ref 3.5–5.2)
Sodium: 135 mmol/L (ref 134–144)
eGFR: 53 mL/min/{1.73_m2} — ABNORMAL LOW (ref >59)

## 2020-07-13 LAB — POCT INR: INR: 6.7 — AB (ref 2.0–3.0)

## 2020-07-13 NOTE — Patient Instructions (Signed)
Hold Wednesday, Thursday, Friday and Saturday and then decrease to 0.5 tablet Daily except 1 tablet Monday and Friday  Call coumadin clinic for any concerns.

## 2020-07-13 NOTE — Patient Instructions (Signed)
Medication Instructions:  No changes *If you need a refill on your cardiac medications before your next appointment, please call your pharmacy*   Lab Work: Your provider would like for you to have the following labs today: BMET  If you have labs (blood work) drawn today and your tests are completely normal, you will receive your results only by: Marland Kitchen MyChart Message (if you have MyChart) OR . A paper copy in the mail If you have any lab test that is abnormal or we need to change your treatment, we will call you to review the results.   Testing/Procedures: None ordered   Follow-Up: At [facility], you and your health needs are our priority.  As part of our continuing mission to provide you with exceptional heart care, we have created designated Provider Care Teams.  These Care Teams include your primary Cardiologist (physician) and Advanced Practice Providers (APPs -  Physician Assistants and Nurse Practitioners) who all work together to provide you with the care you need, when you need it.  We recommend signing up for the patient portal called "MyChart".  Sign up information is provided on this After Visit Summary.  MyChart is used to connect with patients for Virtual Visits (Telemedicine).  Patients are able to view lab/test results, encounter notes, upcoming appointments, etc.  Non-urgent messages can be sent to your provider as well.   To learn more about what you can do with MyChart, go to [email or web address].    Your next appointment:   6 week(s)  The format for your next appointment:   In Person  Provider:  With APP

## 2020-07-20 ENCOUNTER — Ambulatory Visit: Payer: Medicare HMO

## 2020-07-20 ENCOUNTER — Other Ambulatory Visit: Payer: Self-pay

## 2020-07-20 DIAGNOSIS — J329 Chronic sinusitis, unspecified: Secondary | ICD-10-CM | POA: Diagnosis not present

## 2020-07-20 DIAGNOSIS — M1990 Unspecified osteoarthritis, unspecified site: Secondary | ICD-10-CM | POA: Diagnosis not present

## 2020-07-20 DIAGNOSIS — I110 Hypertensive heart disease with heart failure: Secondary | ICD-10-CM | POA: Diagnosis not present

## 2020-07-20 DIAGNOSIS — Z7901 Long term (current) use of anticoagulants: Secondary | ICD-10-CM

## 2020-07-20 DIAGNOSIS — I272 Pulmonary hypertension, unspecified: Secondary | ICD-10-CM | POA: Diagnosis not present

## 2020-07-20 DIAGNOSIS — I351 Nonrheumatic aortic (valve) insufficiency: Secondary | ICD-10-CM | POA: Diagnosis not present

## 2020-07-20 DIAGNOSIS — M109 Gout, unspecified: Secondary | ICD-10-CM | POA: Diagnosis not present

## 2020-07-20 DIAGNOSIS — E049 Nontoxic goiter, unspecified: Secondary | ICD-10-CM | POA: Diagnosis not present

## 2020-07-20 DIAGNOSIS — I483 Typical atrial flutter: Secondary | ICD-10-CM

## 2020-07-20 DIAGNOSIS — K5732 Diverticulitis of large intestine without perforation or abscess without bleeding: Secondary | ICD-10-CM | POA: Diagnosis not present

## 2020-07-20 DIAGNOSIS — I447 Left bundle-branch block, unspecified: Secondary | ICD-10-CM | POA: Diagnosis not present

## 2020-07-20 DIAGNOSIS — I499 Cardiac arrhythmia, unspecified: Secondary | ICD-10-CM | POA: Diagnosis not present

## 2020-07-20 DIAGNOSIS — E878 Other disorders of electrolyte and fluid balance, not elsewhere classified: Secondary | ICD-10-CM | POA: Diagnosis not present

## 2020-07-20 DIAGNOSIS — Z5181 Encounter for therapeutic drug level monitoring: Secondary | ICD-10-CM

## 2020-07-20 DIAGNOSIS — I4892 Unspecified atrial flutter: Secondary | ICD-10-CM | POA: Diagnosis not present

## 2020-07-20 DIAGNOSIS — D649 Anemia, unspecified: Secondary | ICD-10-CM | POA: Diagnosis not present

## 2020-07-20 DIAGNOSIS — J309 Allergic rhinitis, unspecified: Secondary | ICD-10-CM | POA: Diagnosis not present

## 2020-07-20 DIAGNOSIS — G4733 Obstructive sleep apnea (adult) (pediatric): Secondary | ICD-10-CM | POA: Diagnosis not present

## 2020-07-20 DIAGNOSIS — J984 Other disorders of lung: Secondary | ICD-10-CM | POA: Diagnosis not present

## 2020-07-20 DIAGNOSIS — I728 Aneurysm of other specified arteries: Secondary | ICD-10-CM | POA: Diagnosis not present

## 2020-07-20 DIAGNOSIS — I482 Chronic atrial fibrillation, unspecified: Secondary | ICD-10-CM | POA: Diagnosis not present

## 2020-07-20 DIAGNOSIS — E871 Hypo-osmolality and hyponatremia: Secondary | ICD-10-CM | POA: Diagnosis not present

## 2020-07-20 DIAGNOSIS — I5043 Acute on chronic combined systolic (congestive) and diastolic (congestive) heart failure: Secondary | ICD-10-CM | POA: Diagnosis not present

## 2020-07-20 DIAGNOSIS — J45909 Unspecified asthma, uncomplicated: Secondary | ICD-10-CM | POA: Diagnosis not present

## 2020-07-20 DIAGNOSIS — S4291XD Fracture of right shoulder girdle, part unspecified, subsequent encounter for fracture with routine healing: Secondary | ICD-10-CM | POA: Diagnosis not present

## 2020-07-20 DIAGNOSIS — K219 Gastro-esophageal reflux disease without esophagitis: Secondary | ICD-10-CM | POA: Diagnosis not present

## 2020-07-20 LAB — POCT INR: INR: 1.6 — AB (ref 2.0–3.0)

## 2020-07-20 NOTE — Patient Instructions (Signed)
-   take 1 whole tablet today and tomorrow, then - resume dosage of warfarin of 1/2 tablet Daily except 1 tablet Monday and Friday - Recheck INR in 2 weeks

## 2020-07-21 DIAGNOSIS — J329 Chronic sinusitis, unspecified: Secondary | ICD-10-CM | POA: Diagnosis not present

## 2020-07-21 DIAGNOSIS — I5043 Acute on chronic combined systolic (congestive) and diastolic (congestive) heart failure: Secondary | ICD-10-CM | POA: Diagnosis not present

## 2020-07-21 DIAGNOSIS — K219 Gastro-esophageal reflux disease without esophagitis: Secondary | ICD-10-CM | POA: Diagnosis not present

## 2020-07-21 DIAGNOSIS — J309 Allergic rhinitis, unspecified: Secondary | ICD-10-CM | POA: Diagnosis not present

## 2020-07-21 DIAGNOSIS — I499 Cardiac arrhythmia, unspecified: Secondary | ICD-10-CM | POA: Diagnosis not present

## 2020-07-21 DIAGNOSIS — I4892 Unspecified atrial flutter: Secondary | ICD-10-CM | POA: Diagnosis not present

## 2020-07-21 DIAGNOSIS — I110 Hypertensive heart disease with heart failure: Secondary | ICD-10-CM | POA: Diagnosis not present

## 2020-07-21 DIAGNOSIS — I351 Nonrheumatic aortic (valve) insufficiency: Secondary | ICD-10-CM | POA: Diagnosis not present

## 2020-07-21 DIAGNOSIS — E878 Other disorders of electrolyte and fluid balance, not elsewhere classified: Secondary | ICD-10-CM | POA: Diagnosis not present

## 2020-07-21 DIAGNOSIS — I272 Pulmonary hypertension, unspecified: Secondary | ICD-10-CM | POA: Diagnosis not present

## 2020-07-21 DIAGNOSIS — D649 Anemia, unspecified: Secondary | ICD-10-CM | POA: Diagnosis not present

## 2020-07-21 DIAGNOSIS — I482 Chronic atrial fibrillation, unspecified: Secondary | ICD-10-CM | POA: Diagnosis not present

## 2020-07-21 DIAGNOSIS — G4733 Obstructive sleep apnea (adult) (pediatric): Secondary | ICD-10-CM | POA: Diagnosis not present

## 2020-07-21 DIAGNOSIS — I447 Left bundle-branch block, unspecified: Secondary | ICD-10-CM | POA: Diagnosis not present

## 2020-07-21 DIAGNOSIS — M1990 Unspecified osteoarthritis, unspecified site: Secondary | ICD-10-CM | POA: Diagnosis not present

## 2020-07-21 DIAGNOSIS — K5732 Diverticulitis of large intestine without perforation or abscess without bleeding: Secondary | ICD-10-CM | POA: Diagnosis not present

## 2020-07-21 DIAGNOSIS — I728 Aneurysm of other specified arteries: Secondary | ICD-10-CM | POA: Diagnosis not present

## 2020-07-21 DIAGNOSIS — E871 Hypo-osmolality and hyponatremia: Secondary | ICD-10-CM | POA: Diagnosis not present

## 2020-07-21 DIAGNOSIS — J984 Other disorders of lung: Secondary | ICD-10-CM | POA: Diagnosis not present

## 2020-07-21 DIAGNOSIS — J45909 Unspecified asthma, uncomplicated: Secondary | ICD-10-CM | POA: Diagnosis not present

## 2020-07-21 DIAGNOSIS — S4291XD Fracture of right shoulder girdle, part unspecified, subsequent encounter for fracture with routine healing: Secondary | ICD-10-CM | POA: Diagnosis not present

## 2020-07-21 DIAGNOSIS — E049 Nontoxic goiter, unspecified: Secondary | ICD-10-CM | POA: Diagnosis not present

## 2020-07-21 DIAGNOSIS — M109 Gout, unspecified: Secondary | ICD-10-CM | POA: Diagnosis not present

## 2020-07-23 DIAGNOSIS — K5732 Diverticulitis of large intestine without perforation or abscess without bleeding: Secondary | ICD-10-CM | POA: Diagnosis not present

## 2020-07-23 DIAGNOSIS — K219 Gastro-esophageal reflux disease without esophagitis: Secondary | ICD-10-CM | POA: Diagnosis not present

## 2020-07-23 DIAGNOSIS — S4291XD Fracture of right shoulder girdle, part unspecified, subsequent encounter for fracture with routine healing: Secondary | ICD-10-CM | POA: Diagnosis not present

## 2020-07-23 DIAGNOSIS — I5043 Acute on chronic combined systolic (congestive) and diastolic (congestive) heart failure: Secondary | ICD-10-CM | POA: Diagnosis not present

## 2020-07-23 DIAGNOSIS — J309 Allergic rhinitis, unspecified: Secondary | ICD-10-CM | POA: Diagnosis not present

## 2020-07-23 DIAGNOSIS — J984 Other disorders of lung: Secondary | ICD-10-CM | POA: Diagnosis not present

## 2020-07-23 DIAGNOSIS — I728 Aneurysm of other specified arteries: Secondary | ICD-10-CM | POA: Diagnosis not present

## 2020-07-23 DIAGNOSIS — I110 Hypertensive heart disease with heart failure: Secondary | ICD-10-CM | POA: Diagnosis not present

## 2020-07-23 DIAGNOSIS — J45909 Unspecified asthma, uncomplicated: Secondary | ICD-10-CM | POA: Diagnosis not present

## 2020-07-23 DIAGNOSIS — I4892 Unspecified atrial flutter: Secondary | ICD-10-CM | POA: Diagnosis not present

## 2020-07-23 DIAGNOSIS — E878 Other disorders of electrolyte and fluid balance, not elsewhere classified: Secondary | ICD-10-CM | POA: Diagnosis not present

## 2020-07-23 DIAGNOSIS — I272 Pulmonary hypertension, unspecified: Secondary | ICD-10-CM | POA: Diagnosis not present

## 2020-07-23 DIAGNOSIS — E871 Hypo-osmolality and hyponatremia: Secondary | ICD-10-CM | POA: Diagnosis not present

## 2020-07-23 DIAGNOSIS — I447 Left bundle-branch block, unspecified: Secondary | ICD-10-CM | POA: Diagnosis not present

## 2020-07-23 DIAGNOSIS — E049 Nontoxic goiter, unspecified: Secondary | ICD-10-CM | POA: Diagnosis not present

## 2020-07-23 DIAGNOSIS — I499 Cardiac arrhythmia, unspecified: Secondary | ICD-10-CM | POA: Diagnosis not present

## 2020-07-23 DIAGNOSIS — J329 Chronic sinusitis, unspecified: Secondary | ICD-10-CM | POA: Diagnosis not present

## 2020-07-23 DIAGNOSIS — D649 Anemia, unspecified: Secondary | ICD-10-CM | POA: Diagnosis not present

## 2020-07-23 DIAGNOSIS — I351 Nonrheumatic aortic (valve) insufficiency: Secondary | ICD-10-CM | POA: Diagnosis not present

## 2020-07-23 DIAGNOSIS — I482 Chronic atrial fibrillation, unspecified: Secondary | ICD-10-CM | POA: Diagnosis not present

## 2020-07-23 DIAGNOSIS — G4733 Obstructive sleep apnea (adult) (pediatric): Secondary | ICD-10-CM | POA: Diagnosis not present

## 2020-07-23 DIAGNOSIS — M109 Gout, unspecified: Secondary | ICD-10-CM | POA: Diagnosis not present

## 2020-07-23 DIAGNOSIS — M1990 Unspecified osteoarthritis, unspecified site: Secondary | ICD-10-CM | POA: Diagnosis not present

## 2020-07-26 DIAGNOSIS — E049 Nontoxic goiter, unspecified: Secondary | ICD-10-CM | POA: Diagnosis not present

## 2020-07-26 DIAGNOSIS — J45909 Unspecified asthma, uncomplicated: Secondary | ICD-10-CM | POA: Diagnosis not present

## 2020-07-26 DIAGNOSIS — J309 Allergic rhinitis, unspecified: Secondary | ICD-10-CM | POA: Diagnosis not present

## 2020-07-26 DIAGNOSIS — K5732 Diverticulitis of large intestine without perforation or abscess without bleeding: Secondary | ICD-10-CM | POA: Diagnosis not present

## 2020-07-26 DIAGNOSIS — E878 Other disorders of electrolyte and fluid balance, not elsewhere classified: Secondary | ICD-10-CM | POA: Diagnosis not present

## 2020-07-26 DIAGNOSIS — I4892 Unspecified atrial flutter: Secondary | ICD-10-CM | POA: Diagnosis not present

## 2020-07-26 DIAGNOSIS — I447 Left bundle-branch block, unspecified: Secondary | ICD-10-CM | POA: Diagnosis not present

## 2020-07-26 DIAGNOSIS — G4733 Obstructive sleep apnea (adult) (pediatric): Secondary | ICD-10-CM | POA: Diagnosis not present

## 2020-07-26 DIAGNOSIS — D649 Anemia, unspecified: Secondary | ICD-10-CM | POA: Diagnosis not present

## 2020-07-26 DIAGNOSIS — M1990 Unspecified osteoarthritis, unspecified site: Secondary | ICD-10-CM | POA: Diagnosis not present

## 2020-07-26 DIAGNOSIS — I482 Chronic atrial fibrillation, unspecified: Secondary | ICD-10-CM | POA: Diagnosis not present

## 2020-07-26 DIAGNOSIS — I351 Nonrheumatic aortic (valve) insufficiency: Secondary | ICD-10-CM | POA: Diagnosis not present

## 2020-07-26 DIAGNOSIS — I499 Cardiac arrhythmia, unspecified: Secondary | ICD-10-CM | POA: Diagnosis not present

## 2020-07-26 DIAGNOSIS — M109 Gout, unspecified: Secondary | ICD-10-CM | POA: Diagnosis not present

## 2020-07-26 DIAGNOSIS — J984 Other disorders of lung: Secondary | ICD-10-CM | POA: Diagnosis not present

## 2020-07-26 DIAGNOSIS — K219 Gastro-esophageal reflux disease without esophagitis: Secondary | ICD-10-CM | POA: Diagnosis not present

## 2020-07-26 DIAGNOSIS — I272 Pulmonary hypertension, unspecified: Secondary | ICD-10-CM | POA: Diagnosis not present

## 2020-07-26 DIAGNOSIS — E871 Hypo-osmolality and hyponatremia: Secondary | ICD-10-CM | POA: Diagnosis not present

## 2020-07-26 DIAGNOSIS — I5043 Acute on chronic combined systolic (congestive) and diastolic (congestive) heart failure: Secondary | ICD-10-CM | POA: Diagnosis not present

## 2020-07-26 DIAGNOSIS — I728 Aneurysm of other specified arteries: Secondary | ICD-10-CM | POA: Diagnosis not present

## 2020-07-26 DIAGNOSIS — J329 Chronic sinusitis, unspecified: Secondary | ICD-10-CM | POA: Diagnosis not present

## 2020-07-26 DIAGNOSIS — S4291XD Fracture of right shoulder girdle, part unspecified, subsequent encounter for fracture with routine healing: Secondary | ICD-10-CM | POA: Diagnosis not present

## 2020-07-26 DIAGNOSIS — I110 Hypertensive heart disease with heart failure: Secondary | ICD-10-CM | POA: Diagnosis not present

## 2020-07-27 ENCOUNTER — Ambulatory Visit: Payer: Medicare HMO | Admitting: Family

## 2020-07-27 ENCOUNTER — Telehealth: Payer: Self-pay | Admitting: Cardiology

## 2020-07-27 DIAGNOSIS — S42001A Fracture of unspecified part of right clavicle, initial encounter for closed fracture: Secondary | ICD-10-CM | POA: Diagnosis not present

## 2020-07-27 DIAGNOSIS — M5412 Radiculopathy, cervical region: Secondary | ICD-10-CM | POA: Diagnosis not present

## 2020-07-27 DIAGNOSIS — M542 Cervicalgia: Secondary | ICD-10-CM | POA: Diagnosis not present

## 2020-07-27 NOTE — Telephone Encounter (Signed)
Spoke with patient.  He started on Amoxicillin 185mg  daily x 10 days on 07/25/20 for head congestion.  He is only taking 1/2 tablet daily because it bothers his stomach.  Told pt to continue current warfarin dose and keep INR appt on 08/03/20.  He verbalized understanding and appreciated call back.

## 2020-07-27 NOTE — Progress Notes (Addendum)
Patient ID: [name], male    DOB: [birth year], 87 y.o.   MRN: [id number]  HPI  [name] is a 87 y/o male with a history of asthma, HTN, thyroid disease, atrial flutter, GERD, IBS, BPH, anxiety, anemia, pulmonary HTN and chronic heart failure.   Echo report from 06/18/20 reviewed and showed an EF of 20-25% along with moderately elevated PA pressure of 56 mmHg, severe LAE, mild [name] and mild/moderate TR.   Admitted 06/17/20 due to acute on chronic HF. Cardiology consult obtained. Initially given IV lasix and then transitioned to oral diuretics. Given steroids due to gout flare. Discharged after 6 days.   He presents today for his initial visit with a chief complaint of minimal shortness of breath upon moderate exertion. He describes this as having been present for several months. He has associated fatigue, head congestion (currently on antibiotic), cough and pedal edema (improves some overnight) along with this. He denies any difficulty sleeping, dizziness, abdominal distention, palpitations or chest pain.  Weighing himself daily and has noted a gradual weight loss since admission last month. Using NoSalt for seasoning and would like to resume eating out on occasion.   Past Medical History:  Diagnosis Date  . AICD (automatic cardioverter/defibrillator) present    s/p gen change 04/2015 w/ MDT [name] Crt-D  DTBA1D1, Serial number [id number]  . Allergic rhinitis    [name]  . Anemia   . Anxiety   . Arthritis   . Atrial flutter ([place])    A.  Status post cardioversion; B.  Tikosyn therapy - failed, remains in aflutter  . Bilateral pneumonia 11/02/2013   treated with levaquin  . BPH (benign prostatic hyperplasia)   . 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 Lucky Cowboy)  . Chronic lung disease    spirometry 2015 - no obstruction, + mild restrictive lung disease  . Chronic sinusitis   . Chronic systolic congestive heart failure ([place])   . Dyspnea    with exertion   . Extrinsic asthma    [name]  . Fall with injury 05/28/2017  . GERD (gastroesophageal reflux disease) 2010   h/o esophageal stricture with dilation, LA grade C reflux esophagitis by EGD 2010  . Goiter   . History of diverticulitis of colon 06/2020  . History of GI bleed    Secondary to hemorrhoids  . History of seasonal allergies   . Hypertension   . IBS (irritable bowel syndrome)   . LBBB (left bundle branch block)   . Multiple pulmonary nodules 06/2013   RUL ([facility]. Adam Phenix [facility]) - ?vasculitis as of last CT at [facility]  . NICM (nonischemic cardiomyopathy) [facility])    Cardiac catheterization March 2006 without coronary disease; EF 25% 2018 Jan  . Orthopnea   . 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 Presence of permanent cardiac pacemaker   . Sinus congestion 09/25/2010   Past Surgical History:  Procedure Laterality Date  . BACK SURGERY  1997   bulging disks  . BiV-AICD implant     [employer]  . CARDIAC CATHETERIZATION  06/22/04   Severe, nonischemic cardiomyopathy,EF 25-30%  . CARDIOVERSION  02/22/09   AFlutter-([facility])  . CATARACT EXTRACTION W/PHACO Right 12/21/2019   Procedure: CATARACT EXTRACTION PHACO AND INTRAOCULAR LENS PLACEMENT (IOC) RIGHT 2.23  00:21.9;  Surgeon: Eulogio Bear, MD;  Location: [place];  Service: Ophthalmology;  Laterality: Right;  . CATARACT EXTRACTION W/PHACO Left 02/01/2020   Procedure: CATARACT EXTRACTION PHACO  AND INTRAOCULAR LENS PLACEMENT (IOC) LEFT 3.67 00:39.5;  Surgeon: Eulogio Bear, MD;  Location: [place];  Service: Ophthalmology;  Laterality: Left;  . CHOLECYSTECTOMY    . COLONOSCOPY  10/99   Divertic, splenic, hepatic fleure only  . COLONOSCOPY  10/21/08   aborted-divertics, int hemms (Dr. Vira Agar)  . CORONARY ANGIOPLASTY    . EP IMPLANTABLE DEVICE N/A 04/25/2015   Procedure: BIV ICD Generator Changeout;  Surgeon: Evans Lance, MD;  Location: [facility];  Service: Cardiovascular;  Laterality: N/A;  .  ESOPHAGEAL DILATION  02/18/98  . ESOPHAGOGASTRODUODENOSCOPY  01/1998   stricture, sliding HH, GERD  . ESOPHAGOGASTRODUODENOSCOPY  04/08/01   stricture, gastritis, HH, GERD-no dilation(Dr. Henrene Pastor)  . ESOPHAGOGASTRODUODENOSCOPY  12/22/04   stricture; gastritis; duodenitis, GERD  . ESOPHAGOGASTRODUODENOSCOPY  10/21/08   Reflux esophagitis; Erythem. Duod. (Dr. Vira Agar)  . ESOPHAGOGASTRODUODENOSCOPY  08/2013   erythema gastric fundus - minimal gastritis, HH ([place])  . ESOPHAGOGASTRODUODENOSCOPY  08/2016   dysphagia - mod schatzki rin, dilated Tiffany Kocher)  . ESOPHAGOGASTRODUODENOSCOPY (EGD) WITH PROPOFOL N/A 08/24/2016   mod schatzki ring dilated, duodenal deformity Vira Agar, Gavin Pound, MD)  . goiter removal    . HERNIA REPAIR    . HERNIA REPAIR  01/30/02   Dr. Hassell Done  . INSERT / REPLACE / REMOVE PACEMAKER  2007 and 2017  . JOINT REPLACEMENT Right    shoulder  . NOSE SURGERY  1971   sinus surgery  . PENILE PROSTHESIS IMPLANT  2007   [place]  . PFT  10/2013   FVC 61%, FEV1 63%, ratio 0.76  . Pulmonary eval  04/2002   ([facility]) Chronic congestive symptoms  . REVERSE SHOULDER ARTHROPLASTY Right 01/17/2016   Procedure: REVERSE SHOULDER ARTHROPLASTY;  Surgeon: Corky Mull, MD;  Location: [facility];  Service: Orthopedics;  Laterality: Right;  . REVERSE TOTAL SHOULDER ARTHROPLASTY Right 01/2016   Poggi  . US ECHOCARDIOGRAPHY  07/13/04   EF 25-30%, Mod LVH; LA severe dilation; Mild AR; IRTR  . US ECHOCARDIOGRAPHY  11/27/06   hypokinesis posterior wall, EF 35%; mild AR   Social History   Tobacco Use  . Smoking status: Never Smoker  . Smokeless tobacco: Never Used  Substance Use Topics  . Alcohol use: No   Allergies  Allergen Reactions  . Carafate [Sucralfate] Other (See Comments)    Burns stomach  . Clarithromycin Nausea Only  . Famotidine Other (See Comments)    ABD. PAIN  . Levaquin [Levofloxacin] Other (See Comments)    Stomach pain, has to eat a lot of food  . Omeprazole Diarrhea  .  Oxytetracycline Other (See Comments)    BUMPS  . Penicillins Swelling    Amoxicillin ok, Pt states he had to have a shot to reverse the reaction Has patient had a PCN reaction causing immediate rash, facial/tongue/throat swelling, SOB or lightheadedness with hypotension: Yes Has patient had a PCN reaction causing severe rash involving mucus membranes or skin necrosis: No Has patient had a PCN reaction that required hospitalization No Has patient had a PCN reaction occurring within the last 10 years: Yes If all of the above answers are "NO", then may proceed with  . Proton Pump Inhibitors Other (See Comments)    GI upset, diarrhea, gas, bloating  . Ranitidine Diarrhea  . Doxycycline Rash     Prior to Admission medications   Medication Sig Start Date End Date Taking? Authorizing Provider  acetaminophen (TYLENOL) 500 MG tablet Take 250 mg by mouth daily as needed.  Pain    Yes [provider]  allopurinol (ZYLOPRIM) 100 MG tablet Take 1 tablet (100 mg total) by mouth daily. 06/23/20 07/23/20 Yes Max Sane, MD  ALPRAZolam Duanne Moron) 0.25 MG tablet Take 0.5-1 tablets (0.125-0.25 mg total) by mouth at bedtime as needed for anxiety or sleep. 07/01/20  Yes Ria Bush, MD  alum & mag hydroxide-simeth (MAALOX/MYLANTA) 200-200-20 MG/5ML suspension Take 15 mLs by mouth daily as needed for indigestion or heartburn.   Yes [provider]  amoxicillin (AMOXIL) 125 MG chewable tablet Chew 62.5 mg by mouth daily.   Yes [provider]  carvedilol (COREG) 6.25 MG tablet TAKE 1 AND 1/2 TABLETS BY MOUTH TWICE DAILY 04/04/20  Yes [name], MD  fluticasone (FLONASE) 50 MCG/ACT nasal spray Place 1 spray into both nostrils daily.   Yes [provider]  montelukast (SINGULAIR) 10 MG tablet Take 10 mg by mouth at bedtime.   Yes [provider]  polyethylene glycol (MIRALAX / GLYCOLAX) packet Take 17 g by mouth daily as needed (constipation).    Yes [provider]  predniSONE (DELTASONE) 10 MG tablet Take 1 tablet (10 mg total) by mouth daily with breakfast. 07/01/20  Yes Ria Bush, MD  tamsulosin (FLOMAX) 0.4 MG CAPS capsule Take 2 capsules (0.8 mg total) by mouth daily after supper. 07/06/20  Yes Billey Co, MD  torsemide (DEMADEX) 20 MG tablet Take 20 mg by mouth 2 (two) times daily. 2 AM/ 1 PM   Yes [provider]  vitamin B-12 (CYANOCOBALAMIN) 500 MCG tablet Take 2 tablets (1,000 mcg total) by mouth daily. 11/16/19  Yes Ria Bush, MD  warfarin (COUMADIN) 5 MG tablet TAKE 1/2 TO 1 TABLET BY MOUTH DAILY AS DIRECTED BY COUMADIN CLINIC 12/30/19  Yes Evans Lance, MD    Review of Systems  Constitutional: Positive for fatigue. Negative for appetite change.  HENT: Positive for congestion and postnasal drip.   Eyes: Negative.   Respiratory: Positive for cough and shortness of breath. Negative for chest tightness.   Cardiovascular: Positive for leg swelling. Negative for chest pain and palpitations.  Gastrointestinal: Negative for abdominal distention and abdominal pain.  Endocrine: Negative.   Genitourinary: Negative.   Musculoskeletal: Negative for back pain and neck pain.  Skin: Negative.   Allergic/Immunologic: Negative.   Neurological: Negative for dizziness and light-headedness.  Hematological: Negative for adenopathy. Does not bruise/bleed easily.  Psychiatric/Behavioral: Negative for dysphoric mood and sleep disturbance. The patient is not nervous/anxious.    Vitals:   07/28/20 0931  BP: 108/71  Pulse: 70  Resp: 16  SpO2: 91%  Weight: 138 lb 4 oz (62.7 kg)  Height: 5\' 4"  (1.626 m)   Wt Readings from Last 3 Encounters:  07/28/20 138 lb 4 oz (62.7 kg)  07/13/20 138 lb 12.8 oz (63 kg)  07/01/20 139 lb (63 kg)   Lab Results  Component Value Date   CREATININE 1.31 (H) 07/13/2020   CREATININE 1.32 (H) 06/29/2020   CREATININE 1.21 06/23/2020    Physical Exam Vitals and nursing note  reviewed. Exam conducted with a chaperone present (wife ).  Constitutional:      Appearance: He is well-developed.  HENT:     Head: Normocephalic and atraumatic.  Cardiovascular:     Rate and Rhythm: Normal rate and regular rhythm.  Pulmonary:     Effort: Pulmonary effort is normal. No respiratory distress.     Breath sounds: No wheezing or rales.  Abdominal:     Palpations:  Abdomen is soft.     Tenderness: There is no abdominal tenderness.  Musculoskeletal:     Cervical back: Normal range of motion and neck supple.     Right lower leg: No tenderness. Edema (1+ pitting) present.     Left lower leg: No tenderness. Edema (1+ pitting) present.  Skin:    General: Skin is warm and dry.  Neurological:     General: No focal deficit present.     Mental Status: He is alert and oriented to person, place, and time.  Psychiatric:        Mood and Affect: Mood normal.        Behavior: Behavior normal.    Assessment & Plan:  1: Chronic heart failure with reduced ejection fraction- - NYHA class II - euvolemic today - weighing daily and home weight chart reviewed and shows a slight weight loss; reminded to call for an overnight weight gain of > 2 pounds or a weekly weight gain of > 5 pounds - if weight loss continues, may need to consider decreasing torsemide - not adding salt and is using NoSalt for seasoning; he would like to resume eating out on occasion; reviewed how to make low salt choices at a restaurant, avoid fast-food, and be careful what he eats the rest of the day if he goes out to eat.  - drinking ~ 32 ounces of water daily along with coffee and occasional soda - saw cardiology ([place]) 07/13/20 - patient says that he has a sensitive stomach when it comes to taking medications and notes GI reactions with numerous medications - on GDMT of carvedilol only - BP would limit initiation of other medications at this time - wearing compression socks daily; says that he's quite active  during the day so difficult to make time to elevate them during the day; edema improves some overnight after being in the bed  - BNP 06/19/20 was 2034.7  2: HTN- - BP looks good although on the low side (108/71) - saw PCP Danise Mina) 07/01/20 - BMP 07/13/20 reviewed and showed sodium 135, potassium 4.2, creatinine 1.31 and GFR 53   3: Atrial flutter- - INR 07/20/20 was 1.6  4: Pulmonary HTN-  - saw pulmonology Lanney Gins) 05/18/20   Patient did not bring his medications nor a list. Each medication was verbally reviewed with the patient and he was encouraged to bring the bottles to every visit to confirm accuracy of list.  Patient prefers to not make another appointment at this time. Advised patient and his wife that they could call back at anytime for any questions or to schedule another appointment and they were comfortable with this plan.

## 2020-07-27 NOTE — Telephone Encounter (Signed)
Patient started abx and wants advise on warfarin dose.

## 2020-07-28 ENCOUNTER — Ambulatory Visit: Payer: Medicare HMO | Attending: Family | Admitting: Family

## 2020-07-28 ENCOUNTER — Other Ambulatory Visit: Payer: Self-pay

## 2020-07-28 ENCOUNTER — Encounter: Payer: Self-pay | Admitting: Family

## 2020-07-28 VITALS — BP 108/71 | HR 70 | Resp 16 | Ht 64.0 in | Wt 138.2 lb

## 2020-07-28 DIAGNOSIS — R5383 Other fatigue: Secondary | ICD-10-CM | POA: Diagnosis not present

## 2020-07-28 DIAGNOSIS — Z79899 Other long term (current) drug therapy: Secondary | ICD-10-CM | POA: Diagnosis not present

## 2020-07-28 DIAGNOSIS — I483 Typical atrial flutter: Secondary | ICD-10-CM

## 2020-07-28 DIAGNOSIS — I272 Pulmonary hypertension, unspecified: Secondary | ICD-10-CM | POA: Diagnosis not present

## 2020-07-28 DIAGNOSIS — M109 Gout, unspecified: Secondary | ICD-10-CM | POA: Diagnosis not present

## 2020-07-28 DIAGNOSIS — G4733 Obstructive sleep apnea (adult) (pediatric): Secondary | ICD-10-CM | POA: Diagnosis not present

## 2020-07-28 DIAGNOSIS — R0602 Shortness of breath: Secondary | ICD-10-CM | POA: Diagnosis not present

## 2020-07-28 DIAGNOSIS — J309 Allergic rhinitis, unspecified: Secondary | ICD-10-CM | POA: Diagnosis not present

## 2020-07-28 DIAGNOSIS — I110 Hypertensive heart disease with heart failure: Secondary | ICD-10-CM | POA: Diagnosis not present

## 2020-07-28 DIAGNOSIS — I10 Essential (primary) hypertension: Secondary | ICD-10-CM

## 2020-07-28 DIAGNOSIS — S4291XD Fracture of right shoulder girdle, part unspecified, subsequent encounter for fracture with routine healing: Secondary | ICD-10-CM | POA: Diagnosis not present

## 2020-07-28 DIAGNOSIS — K589 Irritable bowel syndrome without diarrhea: Secondary | ICD-10-CM | POA: Diagnosis not present

## 2020-07-28 DIAGNOSIS — I728 Aneurysm of other specified arteries: Secondary | ICD-10-CM | POA: Diagnosis not present

## 2020-07-28 DIAGNOSIS — Z7952 Long term (current) use of systemic steroids: Secondary | ICD-10-CM | POA: Insufficient documentation

## 2020-07-28 DIAGNOSIS — I4892 Unspecified atrial flutter: Secondary | ICD-10-CM | POA: Diagnosis not present

## 2020-07-28 DIAGNOSIS — E878 Other disorders of electrolyte and fluid balance, not elsewhere classified: Secondary | ICD-10-CM | POA: Diagnosis not present

## 2020-07-28 DIAGNOSIS — I499 Cardiac arrhythmia, unspecified: Secondary | ICD-10-CM | POA: Diagnosis not present

## 2020-07-28 DIAGNOSIS — E079 Disorder of thyroid, unspecified: Secondary | ICD-10-CM | POA: Insufficient documentation

## 2020-07-28 DIAGNOSIS — Z7901 Long term (current) use of anticoagulants: Secondary | ICD-10-CM | POA: Diagnosis not present

## 2020-07-28 DIAGNOSIS — F419 Anxiety disorder, unspecified: Secondary | ICD-10-CM | POA: Insufficient documentation

## 2020-07-28 DIAGNOSIS — I5023 Acute on chronic systolic (congestive) heart failure: Secondary | ICD-10-CM | POA: Diagnosis not present

## 2020-07-28 DIAGNOSIS — K5732 Diverticulitis of large intestine without perforation or abscess without bleeding: Secondary | ICD-10-CM | POA: Diagnosis not present

## 2020-07-28 DIAGNOSIS — D649 Anemia, unspecified: Secondary | ICD-10-CM | POA: Diagnosis not present

## 2020-07-28 DIAGNOSIS — J329 Chronic sinusitis, unspecified: Secondary | ICD-10-CM | POA: Diagnosis not present

## 2020-07-28 DIAGNOSIS — I5022 Chronic systolic (congestive) heart failure: Secondary | ICD-10-CM

## 2020-07-28 DIAGNOSIS — J984 Other disorders of lung: Secondary | ICD-10-CM | POA: Diagnosis not present

## 2020-07-28 DIAGNOSIS — K219 Gastro-esophageal reflux disease without esophagitis: Secondary | ICD-10-CM | POA: Insufficient documentation

## 2020-07-28 DIAGNOSIS — E049 Nontoxic goiter, unspecified: Secondary | ICD-10-CM | POA: Diagnosis not present

## 2020-07-28 DIAGNOSIS — M1990 Unspecified osteoarthritis, unspecified site: Secondary | ICD-10-CM | POA: Diagnosis not present

## 2020-07-28 DIAGNOSIS — I482 Chronic atrial fibrillation, unspecified: Secondary | ICD-10-CM | POA: Diagnosis not present

## 2020-07-28 DIAGNOSIS — I5043 Acute on chronic combined systolic (congestive) and diastolic (congestive) heart failure: Secondary | ICD-10-CM | POA: Diagnosis not present

## 2020-07-28 DIAGNOSIS — I351 Nonrheumatic aortic (valve) insufficiency: Secondary | ICD-10-CM | POA: Diagnosis not present

## 2020-07-28 DIAGNOSIS — J45909 Unspecified asthma, uncomplicated: Secondary | ICD-10-CM | POA: Insufficient documentation

## 2020-07-28 DIAGNOSIS — I447 Left bundle-branch block, unspecified: Secondary | ICD-10-CM | POA: Diagnosis not present

## 2020-07-28 DIAGNOSIS — E871 Hypo-osmolality and hyponatremia: Secondary | ICD-10-CM | POA: Diagnosis not present

## 2020-07-28 DIAGNOSIS — R69 Illness, unspecified: Secondary | ICD-10-CM | POA: Diagnosis not present

## 2020-07-28 NOTE — Patient Instructions (Addendum)
Continue weighing daily and call for an overnight weight gain of > 2 pounds or a weekly weight gain of >5 pounds.   Call us in the future if you need us 

## 2020-07-29 DIAGNOSIS — I728 Aneurysm of other specified arteries: Secondary | ICD-10-CM | POA: Diagnosis not present

## 2020-07-29 DIAGNOSIS — M1990 Unspecified osteoarthritis, unspecified site: Secondary | ICD-10-CM | POA: Diagnosis not present

## 2020-07-29 DIAGNOSIS — I5043 Acute on chronic combined systolic (congestive) and diastolic (congestive) heart failure: Secondary | ICD-10-CM | POA: Diagnosis not present

## 2020-07-29 DIAGNOSIS — I447 Left bundle-branch block, unspecified: Secondary | ICD-10-CM | POA: Diagnosis not present

## 2020-07-29 DIAGNOSIS — I351 Nonrheumatic aortic (valve) insufficiency: Secondary | ICD-10-CM | POA: Diagnosis not present

## 2020-07-29 DIAGNOSIS — K5732 Diverticulitis of large intestine without perforation or abscess without bleeding: Secondary | ICD-10-CM | POA: Diagnosis not present

## 2020-07-29 DIAGNOSIS — I4892 Unspecified atrial flutter: Secondary | ICD-10-CM | POA: Diagnosis not present

## 2020-07-29 DIAGNOSIS — I110 Hypertensive heart disease with heart failure: Secondary | ICD-10-CM | POA: Diagnosis not present

## 2020-07-29 DIAGNOSIS — G4733 Obstructive sleep apnea (adult) (pediatric): Secondary | ICD-10-CM | POA: Diagnosis not present

## 2020-07-29 DIAGNOSIS — S4291XD Fracture of right shoulder girdle, part unspecified, subsequent encounter for fracture with routine healing: Secondary | ICD-10-CM | POA: Diagnosis not present

## 2020-07-29 DIAGNOSIS — J45909 Unspecified asthma, uncomplicated: Secondary | ICD-10-CM | POA: Diagnosis not present

## 2020-07-29 DIAGNOSIS — M109 Gout, unspecified: Secondary | ICD-10-CM | POA: Diagnosis not present

## 2020-07-29 DIAGNOSIS — J984 Other disorders of lung: Secondary | ICD-10-CM | POA: Diagnosis not present

## 2020-07-29 DIAGNOSIS — E049 Nontoxic goiter, unspecified: Secondary | ICD-10-CM | POA: Diagnosis not present

## 2020-07-29 DIAGNOSIS — E871 Hypo-osmolality and hyponatremia: Secondary | ICD-10-CM | POA: Diagnosis not present

## 2020-07-29 DIAGNOSIS — I482 Chronic atrial fibrillation, unspecified: Secondary | ICD-10-CM | POA: Diagnosis not present

## 2020-07-29 DIAGNOSIS — D649 Anemia, unspecified: Secondary | ICD-10-CM | POA: Diagnosis not present

## 2020-07-29 DIAGNOSIS — K219 Gastro-esophageal reflux disease without esophagitis: Secondary | ICD-10-CM | POA: Diagnosis not present

## 2020-07-29 DIAGNOSIS — E878 Other disorders of electrolyte and fluid balance, not elsewhere classified: Secondary | ICD-10-CM | POA: Diagnosis not present

## 2020-07-29 DIAGNOSIS — J329 Chronic sinusitis, unspecified: Secondary | ICD-10-CM | POA: Diagnosis not present

## 2020-07-29 DIAGNOSIS — I499 Cardiac arrhythmia, unspecified: Secondary | ICD-10-CM | POA: Diagnosis not present

## 2020-07-29 DIAGNOSIS — I272 Pulmonary hypertension, unspecified: Secondary | ICD-10-CM | POA: Diagnosis not present

## 2020-07-29 DIAGNOSIS — J309 Allergic rhinitis, unspecified: Secondary | ICD-10-CM | POA: Diagnosis not present

## 2020-08-03 ENCOUNTER — Other Ambulatory Visit: Payer: Self-pay

## 2020-08-03 ENCOUNTER — Ambulatory Visit: Payer: Medicare HMO

## 2020-08-03 DIAGNOSIS — E871 Hypo-osmolality and hyponatremia: Secondary | ICD-10-CM | POA: Diagnosis not present

## 2020-08-03 DIAGNOSIS — J45909 Unspecified asthma, uncomplicated: Secondary | ICD-10-CM | POA: Diagnosis not present

## 2020-08-03 DIAGNOSIS — I483 Typical atrial flutter: Secondary | ICD-10-CM

## 2020-08-03 DIAGNOSIS — I5043 Acute on chronic combined systolic (congestive) and diastolic (congestive) heart failure: Secondary | ICD-10-CM | POA: Diagnosis not present

## 2020-08-03 DIAGNOSIS — I728 Aneurysm of other specified arteries: Secondary | ICD-10-CM | POA: Diagnosis not present

## 2020-08-03 DIAGNOSIS — J309 Allergic rhinitis, unspecified: Secondary | ICD-10-CM | POA: Diagnosis not present

## 2020-08-03 DIAGNOSIS — Z7901 Long term (current) use of anticoagulants: Secondary | ICD-10-CM | POA: Diagnosis not present

## 2020-08-03 DIAGNOSIS — J329 Chronic sinusitis, unspecified: Secondary | ICD-10-CM | POA: Diagnosis not present

## 2020-08-03 DIAGNOSIS — I351 Nonrheumatic aortic (valve) insufficiency: Secondary | ICD-10-CM | POA: Diagnosis not present

## 2020-08-03 DIAGNOSIS — E049 Nontoxic goiter, unspecified: Secondary | ICD-10-CM | POA: Diagnosis not present

## 2020-08-03 DIAGNOSIS — I4892 Unspecified atrial flutter: Secondary | ICD-10-CM

## 2020-08-03 DIAGNOSIS — I110 Hypertensive heart disease with heart failure: Secondary | ICD-10-CM | POA: Diagnosis not present

## 2020-08-03 DIAGNOSIS — K219 Gastro-esophageal reflux disease without esophagitis: Secondary | ICD-10-CM | POA: Diagnosis not present

## 2020-08-03 DIAGNOSIS — M109 Gout, unspecified: Secondary | ICD-10-CM | POA: Diagnosis not present

## 2020-08-03 DIAGNOSIS — K5732 Diverticulitis of large intestine without perforation or abscess without bleeding: Secondary | ICD-10-CM | POA: Diagnosis not present

## 2020-08-03 DIAGNOSIS — I272 Pulmonary hypertension, unspecified: Secondary | ICD-10-CM | POA: Diagnosis not present

## 2020-08-03 DIAGNOSIS — I499 Cardiac arrhythmia, unspecified: Secondary | ICD-10-CM | POA: Diagnosis not present

## 2020-08-03 DIAGNOSIS — D649 Anemia, unspecified: Secondary | ICD-10-CM | POA: Diagnosis not present

## 2020-08-03 DIAGNOSIS — G4733 Obstructive sleep apnea (adult) (pediatric): Secondary | ICD-10-CM | POA: Diagnosis not present

## 2020-08-03 DIAGNOSIS — Z5181 Encounter for therapeutic drug level monitoring: Secondary | ICD-10-CM

## 2020-08-03 DIAGNOSIS — M1990 Unspecified osteoarthritis, unspecified site: Secondary | ICD-10-CM | POA: Diagnosis not present

## 2020-08-03 DIAGNOSIS — I447 Left bundle-branch block, unspecified: Secondary | ICD-10-CM | POA: Diagnosis not present

## 2020-08-03 DIAGNOSIS — S4291XD Fracture of right shoulder girdle, part unspecified, subsequent encounter for fracture with routine healing: Secondary | ICD-10-CM | POA: Diagnosis not present

## 2020-08-03 DIAGNOSIS — J984 Other disorders of lung: Secondary | ICD-10-CM | POA: Diagnosis not present

## 2020-08-03 DIAGNOSIS — E878 Other disorders of electrolyte and fluid balance, not elsewhere classified: Secondary | ICD-10-CM | POA: Diagnosis not present

## 2020-08-03 DIAGNOSIS — I482 Chronic atrial fibrillation, unspecified: Secondary | ICD-10-CM | POA: Diagnosis not present

## 2020-08-03 LAB — POCT INR: INR: 3 (ref 2.0–3.0)

## 2020-08-03 NOTE — Patient Instructions (Signed)
-   try to have a serving of greens in the next day or 2 - continue dosage of warfarin of 1/2 tablet Daily except 1 tablet Monday and Friday - Recheck INR in 3 weeks

## 2020-08-10 ENCOUNTER — Ambulatory Visit: Payer: Medicare HMO | Admitting: Cardiology

## 2020-08-10 DIAGNOSIS — M810 Age-related osteoporosis without current pathological fracture: Secondary | ICD-10-CM | POA: Diagnosis not present

## 2020-08-10 DIAGNOSIS — M1A00X0 Idiopathic chronic gout, unspecified site, without tophus (tophi): Secondary | ICD-10-CM | POA: Diagnosis not present

## 2020-08-22 DIAGNOSIS — E871 Hypo-osmolality and hyponatremia: Secondary | ICD-10-CM | POA: Diagnosis not present

## 2020-08-22 DIAGNOSIS — I499 Cardiac arrhythmia, unspecified: Secondary | ICD-10-CM | POA: Diagnosis not present

## 2020-08-22 DIAGNOSIS — J45909 Unspecified asthma, uncomplicated: Secondary | ICD-10-CM | POA: Diagnosis not present

## 2020-08-22 DIAGNOSIS — I351 Nonrheumatic aortic (valve) insufficiency: Secondary | ICD-10-CM | POA: Diagnosis not present

## 2020-08-22 DIAGNOSIS — M109 Gout, unspecified: Secondary | ICD-10-CM | POA: Diagnosis not present

## 2020-08-22 DIAGNOSIS — D649 Anemia, unspecified: Secondary | ICD-10-CM | POA: Diagnosis not present

## 2020-08-22 DIAGNOSIS — K5732 Diverticulitis of large intestine without perforation or abscess without bleeding: Secondary | ICD-10-CM | POA: Diagnosis not present

## 2020-08-22 DIAGNOSIS — I5043 Acute on chronic combined systolic (congestive) and diastolic (congestive) heart failure: Secondary | ICD-10-CM | POA: Diagnosis not present

## 2020-08-22 DIAGNOSIS — J329 Chronic sinusitis, unspecified: Secondary | ICD-10-CM | POA: Diagnosis not present

## 2020-08-22 DIAGNOSIS — M1990 Unspecified osteoarthritis, unspecified site: Secondary | ICD-10-CM | POA: Diagnosis not present

## 2020-08-22 DIAGNOSIS — J984 Other disorders of lung: Secondary | ICD-10-CM | POA: Diagnosis not present

## 2020-08-22 DIAGNOSIS — K219 Gastro-esophageal reflux disease without esophagitis: Secondary | ICD-10-CM | POA: Diagnosis not present

## 2020-08-22 DIAGNOSIS — I272 Pulmonary hypertension, unspecified: Secondary | ICD-10-CM | POA: Diagnosis not present

## 2020-08-22 DIAGNOSIS — I4892 Unspecified atrial flutter: Secondary | ICD-10-CM | POA: Diagnosis not present

## 2020-08-22 DIAGNOSIS — I482 Chronic atrial fibrillation, unspecified: Secondary | ICD-10-CM | POA: Diagnosis not present

## 2020-08-22 DIAGNOSIS — E049 Nontoxic goiter, unspecified: Secondary | ICD-10-CM | POA: Diagnosis not present

## 2020-08-22 DIAGNOSIS — I447 Left bundle-branch block, unspecified: Secondary | ICD-10-CM | POA: Diagnosis not present

## 2020-08-22 DIAGNOSIS — I728 Aneurysm of other specified arteries: Secondary | ICD-10-CM | POA: Diagnosis not present

## 2020-08-22 DIAGNOSIS — S4291XD Fracture of right shoulder girdle, part unspecified, subsequent encounter for fracture with routine healing: Secondary | ICD-10-CM | POA: Diagnosis not present

## 2020-08-22 DIAGNOSIS — E878 Other disorders of electrolyte and fluid balance, not elsewhere classified: Secondary | ICD-10-CM | POA: Diagnosis not present

## 2020-08-22 DIAGNOSIS — G4733 Obstructive sleep apnea (adult) (pediatric): Secondary | ICD-10-CM | POA: Diagnosis not present

## 2020-08-22 DIAGNOSIS — I110 Hypertensive heart disease with heart failure: Secondary | ICD-10-CM | POA: Diagnosis not present

## 2020-08-22 DIAGNOSIS — J309 Allergic rhinitis, unspecified: Secondary | ICD-10-CM | POA: Diagnosis not present

## 2020-08-23 NOTE — Progress Notes (Signed)
Cardiology Clinic Note   Patient Name: [name] Date of Encounter: 08/24/2020  Primary Care Provider:  Ria Bush, MD Primary Cardiologist:  [name], MD  Patient Profile    [name] 87 year old male presents to the clinic today for a follow-up evaluation of his acute systolic heart failure and atrial flutter.  Past Medical History    Past Medical History:  Diagnosis Date  . AICD (automatic cardioverter/defibrillator) present    s/p gen change 04/2015 w/ MDT [name] Crt-D  DTBA1D1, Serial number [id number]  . Allergic rhinitis    [name]  . Anemia   . Anxiety   . Arthritis   . Atrial flutter ([place])    A.  Status post cardioversion; B.  Tikosyn therapy - failed, remains in aflutter  . Bilateral pneumonia 11/02/2013   treated with levaquin  . BPH (benign prostatic hyperplasia)   . 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 Lucky Cowboy)  . Chronic lung disease    spirometry 2015 - no obstruction, + mild restrictive lung disease  . Chronic sinusitis   . Chronic systolic congestive heart failure ([place])   . Dyspnea    with exertion  . Extrinsic asthma    [name]  . Fall with injury 05/28/2017  . GERD (gastroesophageal reflux disease) 2010   h/o esophageal stricture with dilation, LA grade C reflux esophagitis by EGD 2010  . Goiter   . History of diverticulitis of colon 06/2020  . History of GI bleed    Secondary to hemorrhoids  . History of seasonal allergies   . Hypertension   . IBS (irritable bowel syndrome)   . LBBB (left bundle branch block)   . Multiple pulmonary nodules 06/2013   RUL (Dr. Adam Phenix at [facility]) - ?vasculitis as of last CT at [facility]  . NICM (nonischemic cardiomyopathy) [facility])    Cardiac catheterization March 2006 without coronary disease; EF 25% 2018 Jan  . Orthopnea   . Porphyria ([place])   . Presence of permanent cardiac pacemaker   . Sinus congestion 09/25/2010   Past Surgical History:   Procedure Laterality Date  . BACK SURGERY  1997   bulging disks  . BiV-AICD implant     [employer]  . CARDIAC CATHETERIZATION  06/22/04   Severe, nonischemic cardiomyopathy,EF 25-30%  . CARDIOVERSION  02/22/09   AFlutter-([facility])  . CATARACT EXTRACTION W/PHACO Right 12/21/2019   Procedure: CATARACT EXTRACTION PHACO AND INTRAOCULAR LENS PLACEMENT (IOC) RIGHT 2.23  00:21.9;  Surgeon: Eulogio Bear, MD;  Location: [place];  Service: Ophthalmology;  Laterality: Right;  . CATARACT EXTRACTION W/PHACO Left 02/01/2020   Procedure: CATARACT EXTRACTION PHACO AND INTRAOCULAR LENS PLACEMENT (IOC) LEFT 3.67 00:39.5;  Surgeon: Eulogio Bear, MD;  Location: [facility];  Service: Ophthalmology;  Laterality: Left;  . CHOLECYSTECTOMY    . COLONOSCOPY  10/99   Divertic, splenic, hepatic fleure only  . COLONOSCOPY  10/21/08   aborted-divertics, int hemms (Dr. Vira Agar)  . CORONARY ANGIOPLASTY    . EP IMPLANTABLE DEVICE N/A 04/25/2015   Procedure: BIV ICD Generator Changeout;  Surgeon: Evans Lance, MD;  Location: [place] [facility];  Service: Cardiovascular;  Laterality: N/A;  . ESOPHAGEAL DILATION  02/18/98  . ESOPHAGOGASTRODUODENOSCOPY  01/1998   stricture, sliding HH, GERD  . ESOPHAGOGASTRODUODENOSCOPY  04/08/01   stricture, gastritis, HH, GERD-no dilation(Dr. Henrene Pastor)  . ESOPHAGOGASTRODUODENOSCOPY  12/22/04   stricture; gastritis; duodenitis, GERD  . ESOPHAGOGASTRODUODENOSCOPY  10/21/08   Reflux esophagitis; Erythem. Duod. (Dr. Vira Agar)  . ESOPHAGOGASTRODUODENOSCOPY  08/2013   erythema gastric fundus - minimal gastritis, HH ([place])  . ESOPHAGOGASTRODUODENOSCOPY  08/2016   dysphagia - mod schatzki rin, dilated Tiffany Kocher)  . ESOPHAGOGASTRODUODENOSCOPY (EGD) WITH PROPOFOL N/A 08/24/2016   mod schatzki ring dilated, duodenal deformity Vira Agar, Gavin Pound, MD)  . goiter removal    . HERNIA REPAIR    . HERNIA REPAIR  01/30/02   Dr. Hassell Done  . INSERT / REPLACE / REMOVE PACEMAKER  2007  and 2017  . JOINT REPLACEMENT Right    shoulder  . NOSE SURGERY  1971   sinus surgery  . PENILE PROSTHESIS IMPLANT  2007   [place]  . PFT  10/2013   FVC 61%, FEV1 63%, ratio 0.76  . Pulmonary eval  04/2002   ([facility]) Chronic congestive symptoms  . REVERSE SHOULDER ARTHROPLASTY Right 01/17/2016   Procedure: REVERSE SHOULDER ARTHROPLASTY;  Surgeon: Corky Mull, MD;  Location: [facility];  Service: Orthopedics;  Laterality: Right;  . REVERSE TOTAL SHOULDER ARTHROPLASTY Right 01/2016   [place]  . US ECHOCARDIOGRAPHY  07/13/04   EF 25-30%, Mod LVH; LA severe dilation; Mild AR; IRTR  . US ECHOCARDIOGRAPHY  11/27/06   hypokinesis posterior wall, EF 35%; mild AR    Allergies  Allergies  Allergen Reactions  . Carafate [Sucralfate] Other (See Comments)    Burns stomach  . Clarithromycin Nausea Only  . Famotidine Other (See Comments)    ABD. PAIN  . Levaquin [Levofloxacin] Other (See Comments)    Stomach pain, has to eat a lot of food  . Omeprazole Diarrhea  . Oxytetracycline Other (See Comments)    BUMPS  . Penicillins Swelling    Amoxicillin ok, Pt states he had to have a shot to reverse the reaction Has patient had a PCN reaction causing immediate rash, facial/tongue/throat swelling, SOB or lightheadedness with hypotension: Yes Has patient had a PCN reaction causing severe rash involving mucus membranes or skin necrosis: No Has patient had a PCN reaction that required hospitalization No Has patient had a PCN reaction occurring within the last 10 years: Yes If all of the above answers are "NO", then may proceed with  . Proton Pump Inhibitors Other (See Comments)    GI upset, diarrhea, gas, bloating  . Ranitidine Diarrhea  . Doxycycline Rash    History of Present Illness    [name] has a PMH of cardiomyopathy and atrial flutter.  His PMH also includes LBBB, aortic valve regurgitation, chronic lung disease, esophageal stricture, GERD, IBS, BPH, thrombocytopenia, pedal edema,  prediabetes, and chest pain.  He was last seen by Dr. [name] on 07/13/2020.  His last echocardiogram showed an LVEF of 20-25%.  He had been admitted to the hospital in [place] with acute on chronic heart failure.  He was discharged on torsemide 40 mg twice daily.  However, he had too much weight loss.  His torsemide dosing was reduced to 20 mg and he had decreased urine output.  His torsemide was changed to 40 mg in the a.m. and 20 mg in the p.m.  His discharge weight was around 134-136 pounds.  On follow-up his weight was 154-156.  He reported he felt well.  He did have some lower extremity swelling.  He reported that it would significantly decrease but not totally go away when he went to sleep at night.  He denied chest pain, arm, and neck discomfort.  He denied palpitations presyncope and syncope.  He  presented with his son who felt he was doing much better.  He reported he was more talkative and animated.  Who presents to the clinic today for follow-up evaluation states he feels well.  He has not reduced his torsemide back to 40/20.  He reports he feels better through the day when he continues his torsemide at 40 mg in the a.m. and 40 mg in p.m.  He is also continue to take his prednisone.  On exam today he has +1 pitting bilateral lower extremity edema.  He continues to wear his lower extremity support stockings.  We reviewed fluid restriction and low-sodium diet.  I will order a BMP today and continue current medication regimen.  I will give him a salty 6 diet sheet, have him continue his lower extremity support stockings, continue daily weights, and follow-up with Dr. [name] in 3 months.  Today he denies chest pain, shortness of breath, lower extremity edema, fatigue, palpitations, melena, hematuria, hemoptysis, diaphoresis, weakness, presyncope, syncope, orthopnea, and PND.   Home Medications    Prior to Admission medications   Medication Sig Start Date End Date Taking? Authorizing Provider   acetaminophen (TYLENOL) 500 MG tablet Take 250 mg by mouth daily as needed. Pain     [provider]  allopurinol (ZYLOPRIM) 100 MG tablet Take 1 tablet (100 mg total) by mouth daily. 06/23/20 07/23/20  Max Sane, MD  ALPRAZolam Duanne Moron) 0.25 MG tablet Take 0.5-1 tablets (0.125-0.25 mg total) by mouth at bedtime as needed for anxiety or sleep. 07/01/20   Ria Bush, MD  alum & mag hydroxide-simeth (MAALOX/MYLANTA) 200-200-20 MG/5ML suspension Take 15 mLs by mouth daily as needed for indigestion or heartburn.    [provider]  amoxicillin (AMOXIL) 125 MG chewable tablet Chew 62.5 mg by mouth daily.    [provider]  carvedilol (COREG) 6.25 MG tablet TAKE 1 AND 1/2 TABLETS BY MOUTH TWICE DAILY 04/04/20   [name], MD  fluticasone (FLONASE) 50 MCG/ACT nasal spray Place 1 spray into both nostrils daily.    [provider]  montelukast (SINGULAIR) 10 MG tablet Take 10 mg by mouth at bedtime.    [provider]  polyethylene glycol (MIRALAX / GLYCOLAX) packet Take 17 g by mouth daily as needed (constipation).     [provider]  predniSONE (DELTASONE) 10 MG tablet Take 1 tablet (10 mg total) by mouth daily with breakfast. 07/01/20   Ria Bush, MD  tamsulosin (FLOMAX) 0.4 MG CAPS capsule Take 2 capsules (0.8 mg total) by mouth daily after supper. 07/06/20   Billey Co, MD  torsemide (DEMADEX) 20 MG tablet Take 20 mg by mouth 2 (two) times daily. 2 AM/ 1 PM    [provider]  vitamin B-12 (CYANOCOBALAMIN) 500 MCG tablet Take 2 tablets (1,000 mcg total) by mouth daily. 11/16/19   Ria Bush, MD  warfarin (COUMADIN) 5 MG tablet TAKE 1/2 TO 1 TABLET BY MOUTH DAILY AS DIRECTED BY COUMADIN CLINIC 12/30/19   Evans Lance, MD    Family History    Family History  Problem Relation Age of Onset  . Coronary artery disease Father   . Hypertension Father   . Heart failure Mother   . Dysphagia Sister   . Breast  cancer Other   . Ovarian cancer Other   . Uterine cancer Other   . Other Sister        stomach problems  . Other Sister        stomach  problems  . Other Sister        stomach problems  . Alcohol abuse Maternal Uncle    He indicated that his mother is deceased. He indicated that his father is deceased. He indicated that three of his seven sisters are alive. He indicated that his maternal grandmother is deceased. He indicated that his maternal grandfather is deceased. He indicated that his paternal grandmother is deceased. He indicated that his paternal grandfather is deceased. He indicated that all of his three children are alive. He indicated that the status of his maternal uncle is unknown.  Social History    Social History   Socioeconomic History  . Marital status: Married    Spouse name: Not on file  . Number of children: 3  . Years of education: Not on file  . Highest education level: Not on file  Occupational History  . Occupation: retired    Fish farm manager: RETIRED  Tobacco Use  . Smoking status: Never Smoker  . Smokeless tobacco: Never Used  Vaping Use  . Vaping Use: Never used  Substance and Sexual Activity  . Alcohol use: No  . Drug use: No  . Sexual activity: Yes  Other Topics Concern  . Not on file  Social History Narrative   Lives with wife   3 children-8 grandchildren   Still works on a farm.   Retired from [employer]   Had [employer]   Good friend with Dr. Jefm Bryant   Activity: No regular exercise   Diet: good water, fruits/vegetables daily   Social Determinants of Health   Financial Resource Strain: Not on file  Food Insecurity: Not on file  Transportation Needs: Not on file  Physical Activity: Not on file  Stress: Not on file  Social Connections: Not on file  Intimate Partner Violence: Not on file     Review of Systems    General:  No chills, fever, night sweats or weight changes.  Cardiovascular:  No chest pain, dyspnea on exertion, edema,  orthopnea, palpitations, paroxysmal nocturnal dyspnea. Dermatological: No rash, lesions/masses Respiratory: No cough, dyspnea Urologic: No hematuria, dysuria Abdominal:   No nausea, vomiting, diarrhea, bright red blood per rectum, melena, or hematemesis Neurologic:  No visual changes, wkns, changes in mental status. All other systems reviewed and are otherwise negative except as noted above.  Physical Exam    VS:  BP 110/68   Pulse 66   Ht 5\' 4"  (1.626 m)   Wt 141 lb 3.2 oz (64 kg)   SpO2 97%   BMI 24.24 kg/m  , BMI Body mass index is 24.24 kg/m. GEN: Well nourished, well developed, in no acute distress. HEENT: normal. Neck: Supple, no JVD, carotid bruits, or masses. Cardiac: RRR, no murmurs, rubs, or gallops. No clubbing, cyanosis, +1 pitting bilateral lower extremity edema.  Radials/DP/PT 2+ and equal bilaterally.  Respiratory:  Respirations regular and unlabored, clear to auscultation bilaterally. GI: Soft, nontender, nondistended, BS + x 4. MS: no deformity or atrophy. Skin: warm and dry, no rash. Neuro:  Strength and sensation are intact. Psych: Normal affect.  Accessory Clinical Findings    Recent Labs: 06/17/2020: ALT 24 06/19/2020: B Natriuretic Peptide 2,034.7 06/23/2020: Hemoglobin 12.0; Platelets 146 07/13/2020: BUN 24; Creatinine, Ser 1.31; Potassium 4.2; Sodium 135   Recent Lipid Panel    Component Value Date/Time   CHOL 183 11/06/2018 1206   CHOL 115 09/01/2013 0507   TRIG 130.0 11/06/2018 1206   TRIG 120 09/01/2013 0507   HDL 49.50 11/06/2018 1206  CHOLHDL 4 11/06/2018 1206   VLDL 26.0 11/06/2018 1206   LDLCALC 107 (H) 11/06/2018 1206    ECG personally reviewed by me today-none today.  Echocardiogram 06/18/2020 IMPRESSIONS    1. Left ventricular ejection fraction, by estimation, is 20 to 25%. The  left ventricle has severely decreased function. The left ventricle  demonstrates global hypokinesis. The left ventricular internal cavity size  was  mildly dilated. Left ventricular  diastolic parameters are indeterminate.  2. Right ventricular systolic function is normal. The right ventricular  size is normal. There is moderately elevated pulmonary artery systolic  pressure. The estimated right ventricular systolic pressure is [id number] mmHg.  3. Left atrial size was severely dilated.  4. Right atrial size was moderately dilated.  5. The mitral valve is normal in structure. Mild mitral valve  regurgitation. No evidence of mitral stenosis.  6. Tricuspid valve regurgitation is mild to moderate.  7. The inferior vena cava is dilated in size with <50% respiratory  variability, suggesting right atrial pressure of 15 mmHg.  Assessment & Plan   1.  Acute on chronic systolic CHF- +1 pitting bilateral lower extremity edema.  No increased DOE or activity intolerance.  Weight remains stable at 141.2.  Continue torsemide, carvedilol, Heart healthy low-sodium diet-salty 6 given Increase physical activity as tolerated Daily weights-contact office with a weight increase of 3 pounds overnight or 5 pounds 8 Lower extremity support stockings-[place] support stocking sheet given. Order BMP  Atrial flutter- heart rate today 110/68.  CHA2DS2-VASc score 3.  Reports compliance with warfarin.  Denies bleeding issues. Continue carvedilol, warfarin Heart healthy low-sodium diet-salty 6 given Increase physical activity as tolerated  ICD- last device check 06/23/2020.  Device functioning normally with appropriate histograms. Follows with EP  Pulmonary hypertension- no increased DOE, activity stable.  Previously recommended conservative treatment. Continue torsemide Heart healthy low-sodium diet-salty 6 given Increase physical activity as tolerated  Disposition: Follow-up with Dr. [name] in 3 months.  Jossie Ng. Cobie Leidner NP-C    08/24/2020, 10:42 AM [address] Office [phone] Fax [phone]  Notice: This dictation was prepared with Dragon dictation along with smaller phrase technology. Any transcriptional errors that result from this process are unintentional and may not be corrected upon review.  I spent 15 minutes examining this patient, reviewing medications, and using patient centered shared decision making involving her cardiac care.  Prior to her visit I spent greater than 20 minutes reviewing her past medical history,  medications, and prior cardiac tests.

## 2020-08-24 ENCOUNTER — Other Ambulatory Visit: Payer: Self-pay | Admitting: Family Medicine

## 2020-08-24 ENCOUNTER — Ambulatory Visit (INDEPENDENT_AMBULATORY_CARE_PROVIDER_SITE_OTHER): Payer: Medicare HMO

## 2020-08-24 ENCOUNTER — Ambulatory Visit: Payer: Medicare HMO | Admitting: General Practice

## 2020-08-24 ENCOUNTER — Other Ambulatory Visit: Payer: Self-pay

## 2020-08-24 ENCOUNTER — Encounter: Payer: Self-pay | Admitting: General Practice

## 2020-08-24 VITALS — BP 110/68 | HR 66 | Ht 64.0 in | Wt 141.2 lb

## 2020-08-24 DIAGNOSIS — I5023 Acute on chronic systolic (congestive) heart failure: Secondary | ICD-10-CM | POA: Diagnosis not present

## 2020-08-24 DIAGNOSIS — Z79899 Other long term (current) drug therapy: Secondary | ICD-10-CM

## 2020-08-24 DIAGNOSIS — Z5181 Encounter for therapeutic drug level monitoring: Secondary | ICD-10-CM

## 2020-08-24 DIAGNOSIS — Z95810 Presence of automatic (implantable) cardiac defibrillator: Secondary | ICD-10-CM | POA: Diagnosis not present

## 2020-08-24 DIAGNOSIS — Z7901 Long term (current) use of anticoagulants: Secondary | ICD-10-CM | POA: Diagnosis not present

## 2020-08-24 DIAGNOSIS — I4892 Unspecified atrial flutter: Secondary | ICD-10-CM | POA: Diagnosis not present

## 2020-08-24 DIAGNOSIS — I272 Pulmonary hypertension, unspecified: Secondary | ICD-10-CM

## 2020-08-24 LAB — BASIC METABOLIC PANEL
BUN/Creatinine Ratio: 20 (ref 10–24)
BUN: 24 mg/dL (ref 8–27)
CO2: 27 mmol/L (ref 20–29)
Calcium: 9 mg/dL (ref 8.6–10.2)
Chloride: 94 mmol/L — ABNORMAL LOW (ref 96–106)
Creatinine, Ser: 1.23 mg/dL (ref 0.76–1.27)
Glucose: 97 mg/dL (ref 65–99)
Potassium: 4.9 mmol/L (ref 3.5–5.2)
Sodium: 135 mmol/L (ref 134–144)
eGFR: 57 mL/min/{1.73_m2} — ABNORMAL LOW (ref >59)

## 2020-08-24 LAB — POCT INR: INR: 2.1 (ref 2.0–3.0)

## 2020-08-24 NOTE — Patient Instructions (Signed)
Medication Instructions:  The current medical regimen is effective;  continue present plan and medications as directed. Please refer to the Current Medication list given to you today.  *If you need a refill on your cardiac medications before your next appointment, please call your pharmacy*  Lab Work: BMET TODAY If you have labs (blood work) drawn today and your tests are completely normal, you will receive your results only by:  [place] (if you have MyChart) OR A paper copy in the mail.  If you have any lab test that is abnormal or we need to change your treatment, we will call you to review the results. You may go to any [facility] that is convenient for you however, we do have a lab in our office that is able to assist you. You DO NOT need an appointment for our lab. The lab is open 8:00am and closes at 4:00pm. Lunch 12:45 - 1:45pm.  Special Instructions PLEASE READ AND FOLLOW SALTY 6-ATTACHED-1,800 mg daily  PLEASE INCREASE PHYSICAL ACTIVITY AS TOLERATED  Follow-Up: Your next appointment:  3 month(s) In Person with [name], MD OR IF UNAVAILABLE [place], FNP-C   At [facility], you and your health needs are our priority.  As part of our continuing mission to provide you with exceptional heart care, we have created designated Provider Care Teams.  These Care Teams include your primary Cardiologist (physician) and Advanced Practice Providers (APPs -  Physician Assistants and Nurse Practitioners) who all work together to provide you with the care you need, when you need it.

## 2020-08-24 NOTE — Patient Instructions (Signed)
-   continue dosage of warfarin of 1/2 tablet Daily except 1 tablet Monday and Friday - Recheck INR in 4 weeks

## 2020-08-24 NOTE — Telephone Encounter (Signed)
Name of Medication: Alprazolam Name of Pharmacy: [facility] or Written Date and Quantity: 07/01/20, #30 Last Office Visit and Type: 07/01/20, hosp f/u Next Office Visit and Type: none Last Controlled Substance Agreement Date: none Last UDS: none

## 2020-08-26 NOTE — Telephone Encounter (Signed)
ERx 

## 2020-09-01 ENCOUNTER — Encounter: Payer: Self-pay | Admitting: Student

## 2020-09-01 ENCOUNTER — Other Ambulatory Visit: Payer: Self-pay

## 2020-09-01 ENCOUNTER — Ambulatory Visit: Payer: Medicare HMO | Admitting: Student

## 2020-09-01 ENCOUNTER — Encounter: Payer: Medicare HMO | Admitting: Student

## 2020-09-01 VITALS — BP 100/68 | HR 65 | Ht 64.0 in | Wt 138.4 lb

## 2020-09-01 DIAGNOSIS — Z7901 Long term (current) use of anticoagulants: Secondary | ICD-10-CM

## 2020-09-01 DIAGNOSIS — Z95810 Presence of automatic (implantable) cardiac defibrillator: Secondary | ICD-10-CM | POA: Diagnosis not present

## 2020-09-01 DIAGNOSIS — I5023 Acute on chronic systolic (congestive) heart failure: Secondary | ICD-10-CM

## 2020-09-01 DIAGNOSIS — I5022 Chronic systolic (congestive) heart failure: Secondary | ICD-10-CM | POA: Diagnosis not present

## 2020-09-01 DIAGNOSIS — I4892 Unspecified atrial flutter: Secondary | ICD-10-CM

## 2020-09-01 LAB — CUP PACEART INCLINIC DEVICE CHECK
Battery Remaining Longevity: 14 mo
Battery Voltage: 2.87 V
Brady Statistic RA Percent Paced: 0.01 %
Brady Statistic RV Percent Paced: 91.67 %
Date Time Interrogation Session: 20220526122516
HighPow Impedance: 41 Ohm
HighPow Impedance: 54 Ohm
Implantable Lead Implant Date: 20070525
Implantable Lead Implant Date: 20070525
Implantable Lead Implant Date: 20070525
Implantable Lead Location: 753858
Implantable Lead Location: 753859
Implantable Lead Location: 753860
Implantable Lead Model: 4194
Implantable Lead Model: 5076
Implantable Lead Model: 6949
Implantable Pulse Generator Implant Date: 20170116
Lead Channel Impedance Value: 190 Ohm
Lead Channel Impedance Value: 304 Ohm
Lead Channel Impedance Value: 342 Ohm
Lead Channel Impedance Value: 418 Ohm
Lead Channel Impedance Value: 456 Ohm
Lead Channel Impedance Value: 475 Ohm
Lead Channel Pacing Threshold Amplitude: 0.75 V
Lead Channel Pacing Threshold Amplitude: 0.875 V
Lead Channel Pacing Threshold Pulse Width: 0.4 ms
Lead Channel Pacing Threshold Pulse Width: 0.4 ms
Lead Channel Sensing Intrinsic Amplitude: 2.5 mV
Lead Channel Setting Pacing Amplitude: 2 V
Lead Channel Setting Pacing Amplitude: 2 V
Lead Channel Setting Pacing Amplitude: 2 V
Lead Channel Setting Pacing Pulse Width: 0.4 ms
Lead Channel Setting Pacing Pulse Width: 0.4 ms
Lead Channel Setting Sensing Sensitivity: 0.45 mV

## 2020-09-01 NOTE — Progress Notes (Signed)
Electrophysiology Office Note Date: 09/01/2020  ID:  [name], DOB [birth year], MRN [id number]  PCP: Ria Bush, MD Primary Cardiologist: [name], MD Electrophysiologist: Cristopher Peru, MD   CC: Routine ICD follow-up  [name] is a 87 y.o. male seen today for Cristopher Peru, MD for routine electrophysiology followup.  Since last being seen in our clinic the patient reports doing about the same. He had a fall in February that set him back a bit, but otherwise is able to continue to do his ADLs and work around the house with minimal issues. According to his son, this has overall been his functional status for "years". He has mild SOB with more exertion than this, but his main complaint is energy, and he wishes he could get around better. He was previously tried on oxygen, but felt worse "dragging the tank around" than he did off oxygen. He is on chronic prednisone. He denies chest pain, palpitations, PND, orthopnea, nausea, vomiting, dizziness, syncope, edema, weight gain, or early satiety. He has not had ICD shocks.   Device History: Medtronic BiV ICD implanted 08/2005, gen change 04/2015.  Past Medical History:  Diagnosis Date  . AICD (automatic cardioverter/defibrillator) present    s/p gen change 04/2015 w/ MDT [name] Crt-D  DTBA1D1, Serial number [id number]  . Allergic rhinitis    [name]  . Anemia   . Anxiety   . Arthritis   . Atrial flutter ([place])    A.  Status post cardioversion; B.  Tikosyn therapy - failed, remains in aflutter  . Bilateral pneumonia 11/02/2013   treated with levaquin  . BPH (benign prostatic hyperplasia)   . 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 Lucky Cowboy)  . Chronic lung disease    spirometry 2015 - no obstruction, + mild restrictive lung disease  . Chronic sinusitis   . Chronic systolic congestive heart failure ([place])   . Dyspnea    with exertion  . Extrinsic asthma    [name]  . Fall with injury  05/28/2017  . GERD (gastroesophageal reflux disease) 2010   h/o esophageal stricture with dilation, LA grade C reflux esophagitis by EGD 2010  . Goiter   . History of diverticulitis of colon 06/2020  . History of GI bleed    Secondary to hemorrhoids  . History of seasonal allergies   . Hypertension   . IBS (irritable bowel syndrome)   . LBBB (left bundle branch block)   . Multiple pulmonary nodules 06/2013   RUL ([facility] Adam [facility]) - ?vasculitis as of last CT at [facility]  . NICM (nonischemic cardiomyopathy) [facility])    Cardiac catheterization March 2006 without coronary disease; EF 25% 2018 Jan  . Orthopnea   . Porphyria ([place])   . Presence of permanent cardiac pacemaker   . Sinus congestion 09/25/2010   Past Surgical History:  Procedure Laterality Date  . BACK SURGERY  1997   bulging disks  . BiV-AICD implant     Medtronic  . CARDIAC CATHETERIZATION  06/22/04   Severe, nonischemic cardiomyopathy,EF 25-30%  . CARDIOVERSION  02/22/09   AFlutter-([facility])  . CATARACT EXTRACTION W/PHACO Right 12/21/2019   Procedure: CATARACT EXTRACTION PHACO AND INTRAOCULAR LENS PLACEMENT (IOC) RIGHT 2.23  00:21.9;  Surgeon: Eulogio Bear, MD;  Location: [place];  Service: Ophthalmology;  Laterality: Right;  . CATARACT EXTRACTION W/PHACO Left 02/01/2020   Procedure: CATARACT EXTRACTION PHACO AND INTRAOCULAR LENS PLACEMENT (IOC) LEFT  3.67 00:39.5;  Surgeon: Eulogio Bear, MD;  Location: [place];  Service: Ophthalmology;  Laterality: Left;  . CHOLECYSTECTOMY    . COLONOSCOPY  10/99   Divertic, splenic, hepatic fleure only  . COLONOSCOPY  10/21/08   aborted-divertics, int hemms (Dr. Vira Agar)  . CORONARY ANGIOPLASTY    . EP IMPLANTABLE DEVICE N/A 04/25/2015   Procedure: BIV ICD Generator Changeout;  Surgeon: Evans Lance, MD;  Location: [place] [facility];  Service: Cardiovascular;  Laterality: N/A;  . ESOPHAGEAL DILATION  02/18/98  . ESOPHAGOGASTRODUODENOSCOPY   01/1998   stricture, sliding HH, GERD  . ESOPHAGOGASTRODUODENOSCOPY  04/08/01   stricture, gastritis, HH, GERD-no dilation(Dr. Henrene Pastor)  . ESOPHAGOGASTRODUODENOSCOPY  12/22/04   stricture; gastritis; duodenitis, GERD  . ESOPHAGOGASTRODUODENOSCOPY  10/21/08   Reflux esophagitis; Erythem. Duod. (Dr. Vira Agar)  . ESOPHAGOGASTRODUODENOSCOPY  08/2013   erythema gastric fundus - minimal gastritis, HH ([place])  . ESOPHAGOGASTRODUODENOSCOPY  08/2016   dysphagia - mod schatzki rin, dilated Tiffany Kocher)  . ESOPHAGOGASTRODUODENOSCOPY (EGD) WITH PROPOFOL N/A 08/24/2016   mod schatzki ring dilated, duodenal deformity Vira Agar, Gavin Pound, MD)  . goiter removal    . HERNIA REPAIR    . HERNIA REPAIR  01/30/02   Dr. Hassell Done  . INSERT / REPLACE / REMOVE PACEMAKER  2007 and 2017  . JOINT REPLACEMENT Right    shoulder  . NOSE SURGERY  1971   sinus surgery  . PENILE PROSTHESIS IMPLANT  2007   [place]  . PFT  10/2013   FVC 61%, FEV1 63%, ratio 0.76  . Pulmonary eval  04/2002   ([facility]) Chronic congestive symptoms  . REVERSE SHOULDER ARTHROPLASTY Right 01/17/2016   Procedure: REVERSE SHOULDER ARTHROPLASTY;  Surgeon: Corky Mull, MD;  Location: [facility];  Service: Orthopedics;  Laterality: Right;  . REVERSE TOTAL SHOULDER ARTHROPLASTY Right 01/2016   Poggi  . US ECHOCARDIOGRAPHY  07/13/04   EF 25-30%, Mod LVH; LA severe dilation; Mild AR; IRTR  . US ECHOCARDIOGRAPHY  11/27/06   hypokinesis posterior wall, EF 35%; mild AR    Current Outpatient Medications  Medication Sig Dispense Refill  . acetaminophen (TYLENOL) 500 MG tablet Take 250 mg by mouth daily as needed. Pain     . allopurinol (ZYLOPRIM) 100 MG tablet Take 100 mg by mouth daily. Per patient taking 2 tablets (200 mg)    . ALPRAZolam (XANAX) 0.25 MG tablet TAKE 1/2 TO 1 TABLET (0.125-0.25 MG TOTAL) BY MOUTH AT BEDTIME AS NEEDED FOR ANXIETY OR SLEEP. 30 tablet 1  . alum & mag hydroxide-simeth (MAALOX/MYLANTA) 200-200-20 MG/5ML suspension Take 15 mLs by mouth daily  as needed for indigestion or heartburn.    . carvedilol (COREG) 6.25 MG tablet TAKE 1 AND 1/2 TABLETS BY MOUTH TWICE DAILY 270 tablet 1  . fluticasone (FLONASE) 50 MCG/ACT nasal spray Place 1 spray into both nostrils daily.    . montelukast (SINGULAIR) 10 MG tablet Take 10 mg by mouth at bedtime.    . polyethylene glycol (MIRALAX / GLYCOLAX) packet Take 17 g by mouth daily as needed (constipation).     . predniSONE (DELTASONE) 10 MG tablet Take 1 tablet (10 mg total) by mouth daily with breakfast.  ]  . tamsulosin (FLOMAX) 0.4 MG CAPS capsule Take 2 capsules (0.8 mg total) by mouth daily after supper. 90 capsule 7  . torsemide (DEMADEX) 10 MG tablet Take 10 mg by mouth daily. Peer patient taking 40 mg twice a day    . vitamin B-12 (CYANOCOBALAMIN) 500 MCG  tablet Take 2 tablets (1,000 mcg total) by mouth daily.    Marland Kitchen warfarin (COUMADIN) 5 MG tablet TAKE 1/2 TO 1 TABLET BY MOUTH DAILY AS DIRECTED BY COUMADIN CLINIC 90 tablet 2   No current facility-administered medications for this visit.    Allergies:   Carafate [sucralfate], Clarithromycin, Famotidine, Levaquin [levofloxacin], Omeprazole, Oxytetracycline, Penicillins, Proton pump inhibitors, Ranitidine, and Doxycycline   Social History: Social History   Socioeconomic History  . Marital status: Married    Spouse name: Not on file  . Number of children: 3  . Years of education: Not on file  . Highest education level: Not on file  Occupational History  . Occupation: retired    Fish farm manager: RETIRED  Tobacco Use  . Smoking status: Never Smoker  . Smokeless tobacco: Never Used  Vaping Use  . Vaping Use: Never used  Substance and Sexual Activity  . Alcohol use: No  . Drug use: No  . Sexual activity: Yes  Other Topics Concern  . Not on file  Social History Narrative   Lives with wife   3 children-8 grandchildren   Still works on a farm.   Retired from [employer]   Had [employer]   Good friend with Dr. Jefm Bryant   Activity: No  regular exercise   Diet: good water, fruits/vegetables daily   Social Determinants of Health   Financial Resource Strain: Not on file  Food Insecurity: Not on file  Transportation Needs: Not on file  Physical Activity: Not on file  Stress: Not on file  Social Connections: Not on file  Intimate Partner Violence: Not on file    Family History: Family History  Problem Relation Age of Onset  . Coronary artery disease Father   . Hypertension Father   . Heart failure Mother   . Dysphagia Sister   . Breast cancer Other   . Ovarian cancer Other   . Uterine cancer Other   . Other Sister        stomach problems  . Other Sister        stomach problems  . Other Sister        stomach problems  . Alcohol abuse Maternal Uncle     Review of Systems: All other systems reviewed and are otherwise negative except as noted above.   Physical Exam: Vitals:   09/01/20 1129  BP: 100/68  Pulse: 65  SpO2: 99%  Weight: 138 lb 6.4 oz (62.8 kg)  Height: 5\' 4"  (1.626 m)     GEN- The patient is well appearing, alert and oriented x 3 today.   HEENT: normocephalic, atraumatic; sclera clear, conjunctiva pink; hearing intact; oropharynx clear; neck supple, no JVP Lymph- no cervical lymphadenopathy Lungs- Clear to ausculation bilaterally, normal work of breathing.  No wheezes, rales, rhonchi Heart- Regular rate and rhythm, no murmurs, rubs or gallops, PMI not laterally displaced GI- soft, non-tender, non-distended, bowel sounds present, no hepatosplenomegaly Extremities- no clubbing or cyanosis. No edema; DP/PT/radial pulses 2+ bilaterally MS- no significant deformity or atrophy Skin- warm and dry, no rash or lesion; ICD pocket well healed Psych- euthymic mood, full affect Neuro- strength and sensation are intact  ICD interrogation- reviewed in detail today,  See PACEART report  EKG:  EKG is not ordered today. The ekg ordered 07/13/2020 shows AF with V pacing at 64 bpm, QRS 152 ms.   Recent  Labs: 06/17/2020: ALT 24 06/19/2020: B Natriuretic Peptide 2,034.7 06/23/2020: Hemoglobin 12.0; Platelets 146 08/24/2020: BUN 24; Creatinine, Ser 1.23; Potassium  4.9; Sodium 135   Wt Readings from Last 3 Encounters:  09/01/20 138 lb 6.4 oz (62.8 kg)  08/24/20 141 lb 3.2 oz (64 kg)  07/28/20 138 lb 4 oz (62.7 kg)     Other studies Reviewed: Additional studies/ records that were reviewed today include:  Previous EP and gen cards notes.   Echo 06/18/20 with LVEF 20-25% (stable from 04/2017) BNP 06/2020 2000.  Assessment and Plan:  1.  Chronic systolic dysfunction s/p Medtronic CRT-D  euvolemic today Stable on an appropriate medical regimen Normal ICD function See Pace Art report No changes today  2. Permanent atrial fibrillation/flutter Rates well controlled on coreg and with V pacing. CHA2DS2VASC of at least 4 on coumadin   3. Secondary Hypercoagulable State (ICD10:  D68.69) The patient is at high risk of stroke/thromboembolism with CHA2DS2VASC of at least 4 as above.       [name]'s heart failure has failed to improve despite titration of guideline directed medication such that he qualifies for the [facility] NEO device. The following information from the patient's medical record supports the medical necessity of this procedure for my patient, consistent with the FDA on-label indication for BAROSTIM NEO:  ? LVEF of 20-25%  confirmed by Echo on 06/2020   ? NT-proBNP of <1600 pg/ml  = Labs to be drawn today, 09/01/20  ?Symptomatic despite medication management of: diuretic and beta blocker as evidenced by symptoms below.  ACEi/ARB/ARNi and MRA use has been limited by hypotension and high K  ? This patients signs and symptoms of heart failure include dyspnea with mild to moderate exertion and fatigue   ? NYHA Congestive Heart Failure Classification: IIIa  ? Recent hospitalization for Heart Failure on (not applicable for this patient)   This patient has cardiac  resynchronization therapy but continues to have symptoms. He has a unipolar LV lead so minimal programming options.   I have discussed Barostim technology with the patient and his son who was present today. I think he is very unlikely to have improvement of his functional status given it's chronicity. This is also confounded by lung issues and overall limited mobility. Additionally, I do not expect his PRO-BNP to qualify based on his BNP level of > 2000. IF his BNP is within range, will need to discuss patient appropriateness with the rest of the care  team. At this point, I think the risk of general sedation and and surgery would outweigh the potential benefits to him given his co-morbidities.   Current medicines are reviewed at length with the patient today.   The patient does not have concerns regarding his medicines.  The following changes were made today:  none  Labs/ tests ordered today include:  Orders Placed This Encounter  Procedures  . CBC  . Pro b natriuretic peptide (BNP)  . CUP PACEART INCLINIC DEVICE CHECK   Disposition:   Follow up with Dr. Lovena Le  6 months  Signed, Shirley Friar, PA-C  09/01/2020 1:06 PM  [place] [address] [phone] (office) [phone] (fax)

## 2020-09-01 NOTE — Patient Instructions (Signed)
Medication Instructions:  Your physician recommends that you continue on your current medications as directed. Please refer to the Current Medication list given to you today.\  *If you need a refill on your cardiac medications before your next appointment, please call your pharmacy*   Lab Work: TODAY: CBC, BNP  If you have labs (blood work) drawn today and your tests are completely normal, you will receive your results only by: Marland Kitchen MyChart Message (if you have MyChart) OR . A paper copy in the mail If you have any lab test that is abnormal or we need to change your treatment, we will call you to review the results.   Follow-Up: At [facility], you and your health needs are our priority.  As part of our continuing mission to provide you with exceptional heart care, we have created designated Provider Care Teams.  These Care Teams include your primary Cardiologist (physician) and Advanced Practice Providers (APPs -  Physician Assistants and Nurse Practitioners) who all work together to provide you with the care you need, when you need it.  Your next appointment:   6 month(s)  The format for your next appointment:   In Person  Provider:   You may see Dr Lovena Le or one of the following Advanced Practice Providers on your designated Care Team:    Chanetta Marshall, NP  Tommye Standard, PA-C  Legrand Como "Lima" McCall, [place]

## 2020-09-02 ENCOUNTER — Other Ambulatory Visit: Payer: Self-pay | Admitting: Cardiology

## 2020-09-02 ENCOUNTER — Telehealth: Payer: Self-pay | Admitting: Cardiology

## 2020-09-02 LAB — CBC
Hematocrit: 36.3 % — ABNORMAL LOW (ref 37.5–51.0)
Hemoglobin: 12.1 g/dL — ABNORMAL LOW (ref 13.0–17.7)
MCH: 30 pg (ref 26.6–33.0)
MCHC: 33.3 g/dL (ref 31.5–35.7)
MCV: 90 fL (ref 79–97)
Platelets: 145 10*3/uL — ABNORMAL LOW (ref 150–450)
RBC: 4.03 x10E6/uL — ABNORMAL LOW (ref 4.14–5.80)
RDW: 16.6 % — ABNORMAL HIGH (ref 11.6–15.4)
WBC: 8.2 10*3/uL (ref 3.4–10.8)

## 2020-09-02 LAB — PRO B NATRIURETIC PEPTIDE: NT-Pro BNP: 22792 pg/mL — ABNORMAL HIGH (ref 0–486)

## 2020-09-02 MED ORDER — TORSEMIDE 20 MG PO TABS: 40.0000 mg | ORAL_TABLET | Freq: Two times a day (BID) | ORAL | 1 refills | Status: DC

## 2020-09-02 NOTE — Telephone Encounter (Signed)
If Home Health RN is calling please get Coumadin Nurse on the phone STAT  1.  Are you calling in regards to an appointment? NO  2.  Are you calling for a refill ? NO  3.  Are you having bleeding issues? no  4.  Do you need clearance to hold Coumadin? No   PATIENT RECENTLY STARTED TAKING ABX AND WANTS ADVICE ON WARFARIN    Please route to the Coumadin Clinic Pool

## 2020-09-02 NOTE — Telephone Encounter (Signed)
Returned a call to the pt and he said he had been stopped up and had to do something about it, so he started a medication he had in the fridge and it is in date cause  He didn't use it all when he got it. He spelled it out and it is called Tobramycin 125mg ; he started it last night and will stop once he feels better. Advised that it is safe to take with Warfarin and he verbalized understanding.

## 2020-09-02 NOTE — Telephone Encounter (Signed)
Per [name]'s 08/24/20 note: Who presents to the clinic today for follow-up evaluation states he feels well.  He has not reduced his torsemide back to 40/20.  He reports he feels better through the day when he continues his torsemide at 40 mg in the a.m. and 40 mg in p.m. And... Assessment & Plan 1.  Acute on chronic systolic CHF- +1 pitting bilateral lower extremity edema.  No increased DOE or activity intolerance.  Weight remains stable at 141.2.  Continue torsemide, carvedilol, Heart healthy low-sodium diet-salty 6 given

## 2020-09-09 ENCOUNTER — Other Ambulatory Visit: Payer: Self-pay | Admitting: General Practice

## 2020-09-14 ENCOUNTER — Telehealth: Payer: Self-pay

## 2020-09-14 DIAGNOSIS — J320 Chronic maxillary sinusitis: Secondary | ICD-10-CM | POA: Diagnosis not present

## 2020-09-14 DIAGNOSIS — H903 Sensorineural hearing loss, bilateral: Secondary | ICD-10-CM | POA: Diagnosis not present

## 2020-09-14 NOTE — Telephone Encounter (Signed)
Patient calling in stating he was placed on a prednisone taper and levofloxacin 250 mg. Patient would like to be advised how this may impact his coumadin   Please advise

## 2020-09-14 NOTE — Telephone Encounter (Signed)
Spoke w/ pt. He reports that his head is stopped up and he was started on levaquin and prednisone taper.  Advised pt to come in later this week for INR check in our [facility] or [place] office, but he refuses. Advised him to take 1/2 tablet warfarin every day until his appt w/ me next week, but stressed that I would feel more comfortable if his INR were checked sooner. He states that he takes more 1/2 tabs than he does whole - advised him that I do not know what his INR is, so I cannot properly dose him. He states that he understands and "I'll be careful".

## 2020-09-19 DIAGNOSIS — I5022 Chronic systolic (congestive) heart failure: Secondary | ICD-10-CM

## 2020-09-20 ENCOUNTER — Telehealth: Payer: Self-pay

## 2020-09-20 ENCOUNTER — Telehealth: Payer: Self-pay | Admitting: Family

## 2020-09-20 DIAGNOSIS — I5022 Chronic systolic (congestive) heart failure: Secondary | ICD-10-CM

## 2020-09-20 NOTE — Telephone Encounter (Signed)
Patient's son called wanting to know if patient could get his BMET done here in [place] instead of going to [place].   Advised patient that I see Dr. Rosezella Florida message about getting this done so I will place the BMET order and patient can come to pre admit testing tomorrow morning to get this drawn. Son verbalized understanding.

## 2020-09-20 NOTE — Telephone Encounter (Signed)
Received call from patient --he is concerned he lost 4 pounds overnight between the 12 and 13 of June and was dizzy and weak, did not notice a change in his blood pressure readings. Last night did not take his full dose of Torsemide and did gain one pound over night but is not as weak today.  Wondered what to do with the torsemide --continue to take 40 BID or what.  Spoke with [facility], have patient take 40 in the morning and 20 in the evening.Marland Kitchen He is due for blood work ordered by PCP.  Patient questions, if it can be done soon, the orders were coming in the mail.  Suggested he call the PCP office and discuss that with them.  Pricilla Riffle RN CHFN

## 2020-09-21 ENCOUNTER — Ambulatory Visit: Payer: Medicare HMO

## 2020-09-21 ENCOUNTER — Other Ambulatory Visit
Admission: RE | Admit: 2020-09-21 | Discharge: 2020-09-21 | Disposition: A | Discharge status: Home / Self Care | Payer: Medicare HMO | Source: Ambulatory Visit | Attending: Family | Admitting: Family

## 2020-09-21 ENCOUNTER — Ambulatory Visit (INDEPENDENT_AMBULATORY_CARE_PROVIDER_SITE_OTHER): Payer: Medicare HMO

## 2020-09-21 ENCOUNTER — Encounter
Admission: RE | Admit: 2020-09-21 | Discharge: 2020-09-21 | Disposition: A | Discharge status: Home / Self Care | Payer: Medicare HMO | Source: Ambulatory Visit | Attending: Family | Admitting: Family

## 2020-09-21 ENCOUNTER — Other Ambulatory Visit: Payer: Self-pay | Admitting: *Deleted

## 2020-09-21 ENCOUNTER — Other Ambulatory Visit: Payer: Self-pay

## 2020-09-21 DIAGNOSIS — I5022 Chronic systolic (congestive) heart failure: Secondary | ICD-10-CM | POA: Diagnosis not present

## 2020-09-21 DIAGNOSIS — Z01812 Encounter for preprocedural laboratory examination: Secondary | ICD-10-CM | POA: Insufficient documentation

## 2020-09-21 DIAGNOSIS — I4892 Unspecified atrial flutter: Secondary | ICD-10-CM

## 2020-09-21 DIAGNOSIS — Z7901 Long term (current) use of anticoagulants: Secondary | ICD-10-CM

## 2020-09-21 DIAGNOSIS — I429 Cardiomyopathy, unspecified: Secondary | ICD-10-CM | POA: Diagnosis not present

## 2020-09-21 DIAGNOSIS — Z5181 Encounter for therapeutic drug level monitoring: Secondary | ICD-10-CM

## 2020-09-21 LAB — BASIC METABOLIC PANEL
Anion gap: 12 (ref 5–15)
BUN: 40 mg/dL — ABNORMAL HIGH (ref 8–23)
CO2: 29 mmol/L (ref 22–32)
Calcium: 8.6 mg/dL — ABNORMAL LOW (ref 8.9–10.3)
Chloride: 95 mmol/L — ABNORMAL LOW (ref 98–111)
Creatinine, Ser: 1.32 mg/dL — ABNORMAL HIGH (ref 0.61–1.24)
GFR, Estimated: 52 mL/min — ABNORMAL LOW (ref >60)
Glucose, Bld: 86 mg/dL (ref 70–99)
Potassium: 3.4 mmol/L — ABNORMAL LOW (ref 3.5–5.1)
Sodium: 136 mmol/L (ref 135–145)

## 2020-09-21 LAB — POCT INR: INR: 1.6 — AB (ref 2.0–3.0)

## 2020-09-21 NOTE — Patient Instructions (Signed)
-   take 1 whole tablet today and tomorrow, then  - resume dosage of warfarin of 1/2 tablet Daily except 1 tablet Monday and Friday - Recheck INR in 4 weeks

## 2020-09-22 ENCOUNTER — Telehealth: Payer: Self-pay | Admitting: Family

## 2020-09-22 NOTE — Telephone Encounter (Signed)
Spoke with patient regarding lab results obtained yesterday. Patient has been having weakness and he feels like he's urinating "too much".   He is supposed to be taking 40mg  torsemide daily but today he only took "a pill and a half". He denies any swelling and says that he weighs himself every morning and every evening.   Advised patient that he could try taking just 20mg  torsemide in the morning and take additional 10-20mg  if he notices swelling, shortness of breath or weight gain of > 2 pounds overnight or >5 pounds in a week.   Patient verbalized understanding and was thankful for the dose adjustment. This provider will be out of town until 10/03/20 so instructed patient to call Dr. Rosezella Florida office next week if anything changes.

## 2020-09-23 ENCOUNTER — Other Ambulatory Visit: Payer: Self-pay | Admitting: *Deleted

## 2020-09-23 DIAGNOSIS — E876 Hypokalemia: Secondary | ICD-10-CM

## 2020-09-23 LAB — CUP PACEART REMOTE DEVICE CHECK
Battery Remaining Longevity: 12 mo
Battery Voltage: 2.87 V
Brady Statistic RA Percent Paced: 0.04 %
Brady Statistic RV Percent Paced: 94.57 %
Date Time Interrogation Session: 20220615044224
HighPow Impedance: 41 Ohm
HighPow Impedance: 53 Ohm
Implantable Lead Implant Date: 20070525
Implantable Lead Implant Date: 20070525
Implantable Lead Implant Date: 20070525
Implantable Lead Location: 753858
Implantable Lead Location: 753859
Implantable Lead Location: 753860
Implantable Lead Model: 4194
Implantable Lead Model: 5076
Implantable Lead Model: 6949
Implantable Pulse Generator Implant Date: 20170116
Lead Channel Impedance Value: 190 Ohm
Lead Channel Impedance Value: 342 Ohm
Lead Channel Impedance Value: 361 Ohm
Lead Channel Impedance Value: 456 Ohm
Lead Channel Impedance Value: 475 Ohm
Lead Channel Impedance Value: 475 Ohm
Lead Channel Pacing Threshold Amplitude: 0.75 V
Lead Channel Pacing Threshold Amplitude: 0.75 V
Lead Channel Pacing Threshold Pulse Width: 0.4 ms
Lead Channel Pacing Threshold Pulse Width: 0.4 ms
Lead Channel Sensing Intrinsic Amplitude: 12.875 mV
Lead Channel Sensing Intrinsic Amplitude: 12.875 mV
Lead Channel Sensing Intrinsic Amplitude: 2.625 mV
Lead Channel Sensing Intrinsic Amplitude: 2.625 mV
Lead Channel Setting Pacing Amplitude: 2 V
Lead Channel Setting Pacing Amplitude: 2 V
Lead Channel Setting Pacing Amplitude: 2 V
Lead Channel Setting Pacing Pulse Width: 0.4 ms
Lead Channel Setting Pacing Pulse Width: 0.4 ms
Lead Channel Setting Sensing Sensitivity: 0.45 mV

## 2020-09-23 MED ORDER — POTASSIUM CHLORIDE CRYS ER 20 MEQ PO TBCR: 40.0000 meq | EXTENDED_RELEASE_TABLET | Freq: Once | ORAL | 0 refills | Status: DC

## 2020-10-03 DIAGNOSIS — D6869 Other thrombophilia: Secondary | ICD-10-CM | POA: Diagnosis not present

## 2020-10-03 DIAGNOSIS — K219 Gastro-esophageal reflux disease without esophagitis: Secondary | ICD-10-CM | POA: Diagnosis not present

## 2020-10-03 DIAGNOSIS — R69 Illness, unspecified: Secondary | ICD-10-CM | POA: Diagnosis not present

## 2020-10-03 DIAGNOSIS — M109 Gout, unspecified: Secondary | ICD-10-CM | POA: Diagnosis not present

## 2020-10-03 DIAGNOSIS — I2510 Atherosclerotic heart disease of native coronary artery without angina pectoris: Secondary | ICD-10-CM | POA: Diagnosis not present

## 2020-10-03 DIAGNOSIS — I110 Hypertensive heart disease with heart failure: Secondary | ICD-10-CM | POA: Diagnosis not present

## 2020-10-03 DIAGNOSIS — K5900 Constipation, unspecified: Secondary | ICD-10-CM | POA: Diagnosis not present

## 2020-10-03 DIAGNOSIS — I4891 Unspecified atrial fibrillation: Secondary | ICD-10-CM | POA: Diagnosis not present

## 2020-10-03 DIAGNOSIS — J309 Allergic rhinitis, unspecified: Secondary | ICD-10-CM | POA: Diagnosis not present

## 2020-10-03 DIAGNOSIS — I509 Heart failure, unspecified: Secondary | ICD-10-CM | POA: Diagnosis not present

## 2020-10-06 DIAGNOSIS — E876 Hypokalemia: Secondary | ICD-10-CM | POA: Diagnosis not present

## 2020-10-06 LAB — BASIC METABOLIC PANEL
BUN/Creatinine Ratio: 18 (ref 10–24)
BUN: 24 mg/dL (ref 8–27)
CO2: 27 mmol/L (ref 20–29)
Calcium: 8.8 mg/dL (ref 8.6–10.2)
Chloride: 95 mmol/L — ABNORMAL LOW (ref 96–106)
Creatinine, Ser: 1.32 mg/dL — ABNORMAL HIGH (ref 0.76–1.27)
Glucose: 77 mg/dL (ref 65–99)
Potassium: 4.2 mmol/L (ref 3.5–5.2)
Sodium: 138 mmol/L (ref 134–144)
eGFR: 52 mL/min/{1.73_m2} — ABNORMAL LOW (ref >59)

## 2020-10-13 NOTE — Progress Notes (Signed)
Remote ICD transmission.   

## 2020-10-14 DIAGNOSIS — R059 Cough, unspecified: Secondary | ICD-10-CM | POA: Diagnosis not present

## 2020-10-14 DIAGNOSIS — B9689 Other specified bacterial agents as the cause of diseases classified elsewhere: Secondary | ICD-10-CM | POA: Diagnosis not present

## 2020-10-14 DIAGNOSIS — J019 Acute sinusitis, unspecified: Secondary | ICD-10-CM | POA: Diagnosis not present

## 2020-10-17 ENCOUNTER — Telehealth: Payer: Self-pay

## 2020-10-17 NOTE — Telephone Encounter (Signed)
Patient wants to know if new medication Cefdinir 300mg  can be taken with Warfarin 5mg 

## 2020-10-17 NOTE — Telephone Encounter (Signed)
Spoke with patient.  He was started on cefdinir 300mg  twice daily for sinus infection.  He has been taking warfarin 1/2 tablet daily since starting abx Friday night.  Pt to continue warfarin 1/2 tablet until scheduled INR appt on 7/13 @ 10:15am.  He verbalized understanding.

## 2020-10-19 ENCOUNTER — Other Ambulatory Visit: Payer: Self-pay

## 2020-10-19 ENCOUNTER — Ambulatory Visit: Payer: Medicare HMO

## 2020-10-19 DIAGNOSIS — I4892 Unspecified atrial flutter: Secondary | ICD-10-CM | POA: Diagnosis not present

## 2020-10-19 DIAGNOSIS — Z5181 Encounter for therapeutic drug level monitoring: Secondary | ICD-10-CM

## 2020-10-19 DIAGNOSIS — Z7901 Long term (current) use of anticoagulants: Secondary | ICD-10-CM

## 2020-10-19 LAB — POCT INR: INR: 1.9 — AB (ref 2.0–3.0)

## 2020-10-19 NOTE — Patient Instructions (Signed)
-   take 1 whole tablet today and tomorrow, then  - resume dosage of warfarin of 1/2 tablet Daily except 1 tablet Monday and Friday - Recheck INR in 4 weeks

## 2020-10-24 DIAGNOSIS — R109 Unspecified abdominal pain: Secondary | ICD-10-CM | POA: Diagnosis not present

## 2020-10-24 DIAGNOSIS — K5909 Other constipation: Secondary | ICD-10-CM | POA: Diagnosis not present

## 2020-10-24 DIAGNOSIS — Z8719 Personal history of other diseases of the digestive system: Secondary | ICD-10-CM | POA: Diagnosis not present

## 2020-10-24 DIAGNOSIS — R1011 Right upper quadrant pain: Secondary | ICD-10-CM | POA: Diagnosis not present

## 2020-10-24 DIAGNOSIS — R140 Abdominal distension (gaseous): Secondary | ICD-10-CM | POA: Diagnosis not present

## 2020-10-24 DIAGNOSIS — K589 Irritable bowel syndrome without diarrhea: Secondary | ICD-10-CM | POA: Diagnosis not present

## 2020-11-09 ENCOUNTER — Other Ambulatory Visit: Payer: Self-pay | Admitting: Gastroenterology

## 2020-11-09 DIAGNOSIS — R1011 Right upper quadrant pain: Secondary | ICD-10-CM

## 2020-11-16 ENCOUNTER — Other Ambulatory Visit: Payer: Self-pay

## 2020-11-16 ENCOUNTER — Ambulatory Visit: Payer: Medicare HMO

## 2020-11-16 DIAGNOSIS — Z5181 Encounter for therapeutic drug level monitoring: Secondary | ICD-10-CM

## 2020-11-16 DIAGNOSIS — Z7901 Long term (current) use of anticoagulants: Secondary | ICD-10-CM

## 2020-11-16 DIAGNOSIS — I4892 Unspecified atrial flutter: Secondary | ICD-10-CM | POA: Diagnosis not present

## 2020-11-16 LAB — POCT INR: INR: 2.4 (ref 2.0–3.0)

## 2020-11-16 NOTE — Patient Instructions (Signed)
-   continue dosage of warfarin of 1/2 tablet daily except 1 tablet Monday and Friday - Recheck INR in 5 weeks

## 2020-11-24 ENCOUNTER — Other Ambulatory Visit: Payer: Self-pay | Admitting: Cardiology

## 2020-11-24 NOTE — Progress Notes (Signed)
Cardiology Office Note   Date:  11/25/2020   ID:  [name], DOB 28-Feb-1932, MRN [id number]  PCP:  Ria Bush, MD  Cardiologist:   [name], MD   Chief Complaint  Patient presents with   Fatigue       History of Present Illness: [name] is a 87 y.o. male who presents for follow up of his cardiomyopathy and atrial flutter.  EF on the last echo EF was 20 - 25%.    Since I last saw him he has had his meds adjusted relatively frequently because of his volume status and labs.  He has been followed carefully.  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  I looked at his weights and they are typically 1 33-'1 30 5 '$ in the morning.  He actually has done really well with these.  He does have some chronic lower extremity swelling but he and his wife are not describing PND or orthopnea.  He said no new palpitations, presyncope or syncope.  He does not notice his arrhythmia.  He is not having any chest pressure, neck or arm discomfort.   Past Medical History:  Diagnosis Date   AICD (automatic cardioverter/defibrillator) present    s/p gen change 04/2015 w/ MDT [name] Crt-D  DTBA1D1, Serial number [id number]   Allergic rhinitis    [name]   Anemia    Anxiety    Arthritis    Atrial flutter ([place])    A.  Status post cardioversion; B.  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 Lucky Cowboy)   Chronic lung disease    spirometry 2015 - no obstruction, + mild restrictive lung disease   Chronic sinusitis    Chronic systolic congestive heart failure ([facility])    Dyspnea    with exertion   Extrinsic asthma    [name]   Fall with injury 05/28/2017   GERD (gastroesophageal reflux disease) 2010   h/o esophageal stricture with dilation, LA grade C reflux  esophagitis by EGD 2010   Goiter    History of diverticulitis of colon 06/2020   History of GI bleed    Secondary to hemorrhoids   History of seasonal allergies    Hypertension    IBS (irritable bowel syndrome)    LBBB (left bundle branch block)    Multiple pulmonary nodules 06/2013   RUL ([facility] Adam Phenix [facility]) - ?vasculitis as of last CT at [facility]   NICM (nonischemic cardiomyopathy) [facility])    Cardiac catheterization March 2006 without coronary disease; EF 25% 2018 Jan   Orthopnea    [facility])    Presence of permanent cardiac pacemaker    Sinus congestion 09/25/2010    Past Surgical History:  Procedure Laterality Date   BACK SURGERY  1997   bulging disks   BiV-AICD implant     [employer]   CARDIAC CATHETERIZATION  06/22/04   Severe, nonischemic cardiomyopathy,EF 25-30%   CARDIOVERSION  02/22/09   AFlutter-([facility])   CATARACT EXTRACTION W/PHACO Right 12/21/2019   Procedure: CATARACT EXTRACTION PHACO AND INTRAOCULAR LENS PLACEMENT (IOC) RIGHT 2.23  00:21.9;  Surgeon: Eulogio Bear, MD;  Location: [facility];  Service: Ophthalmology;  Laterality: Right;   CATARACT EXTRACTION W/PHACO  Left 02/01/2020   Procedure: CATARACT EXTRACTION PHACO AND INTRAOCULAR LENS PLACEMENT (IOC) LEFT 3.67 00:39.5;  Surgeon: Eulogio Bear, MD;  Location: [place];  Service: Ophthalmology;  Laterality: Left;   CHOLECYSTECTOMY     COLONOSCOPY  10/99   Divertic, splenic, hepatic fleure only   COLONOSCOPY  10/21/08   aborted-divertics, int hemms (Dr. Vira Agar)   CORONARY ANGIOPLASTY     EP IMPLANTABLE DEVICE N/A 04/25/2015   Procedure: BIV ICD Generator Changeout;  Surgeon: Evans Lance, MD;  Location: [place];  Service: Cardiovascular;  Laterality: N/A;   ESOPHAGEAL DILATION  02/18/98   ESOPHAGOGASTRODUODENOSCOPY  01/1998   stricture, sliding HH, GERD   ESOPHAGOGASTRODUODENOSCOPY  04/08/01   stricture, gastritis, HH, GERD-no dilation(Dr. Henrene Pastor)    ESOPHAGOGASTRODUODENOSCOPY  12/22/04   stricture; gastritis; duodenitis, GERD   ESOPHAGOGASTRODUODENOSCOPY  10/21/08   Reflux esophagitis; Erythem. Duod. (Dr. Vira Agar)   ESOPHAGOGASTRODUODENOSCOPY  08/2013   erythema gastric fundus - minimal gastritis, HH ([place])   ESOPHAGOGASTRODUODENOSCOPY  08/2016   dysphagia - mod schatzki rin, dilated Tiffany Kocher)   ESOPHAGOGASTRODUODENOSCOPY (EGD) WITH PROPOFOL N/A 08/24/2016   mod schatzki ring dilated, duodenal deformity Manya Silvas, MD)   goiter removal     HERNIA REPAIR     HERNIA REPAIR  01/30/02   Dr. Smith Mince / REPLACE / REMOVE PACEMAKER  2007 and 2017   JOINT REPLACEMENT Right    shoulder   NOSE SURGERY  1971   sinus surgery   PENILE PROSTHESIS IMPLANT  2007   [place]   PFT  10/2013   FVC 61%, FEV1 63%, ratio 0.76   Pulmonary eval  04/2002   ([facility]) Chronic congestive symptoms   REVERSE SHOULDER ARTHROPLASTY Right 01/17/2016   Procedure: REVERSE SHOULDER ARTHROPLASTY;  Surgeon: Corky Mull, MD;  Location: [facility];  Service: Orthopedics;  Laterality: Right;   REVERSE TOTAL SHOULDER ARTHROPLASTY Right 01/2016   P[place]gi   US ECHOCARDIOGRAPHY  07/13/04   EF 25-30%, Mod LVH; LA severe dilation; Mild AR; IRTR   US ECHOCARDIOGRAPHY  11/27/06   hypokinesis posterior wall, EF 35%; mild AR     Current Outpatient Medications  Medication Sig Dispense Refill   acetaminophen (TYLENOL) 500 MG tablet Take 250 mg by mouth daily as needed. Pain      allopurinol (ZYLOPRIM) 100 MG tablet Take 100 mg by mouth daily. Per patient taking 2 tablets (200 mg)     ALPRAZolam (XANAX) 0.25 MG tablet TAKE 1/2 TO 1 TABLET (0.125-0.25 MG TOTAL) BY MOUTH AT BEDTIME AS NEEDED FOR ANXIETY OR SLEEP. 30 tablet 1   alum & mag hydroxide-simeth (MAALOX/MYLANTA) 200-200-20 MG/5ML suspension Take 15 mLs by mouth daily as needed for indigestion or heartburn.     carvedilol (COREG) 6.25 MG tablet TAKE 1 AND 1/2 TABLETS BY MOUTH TWICE DAILY 270 tablet 1   fluticasone  (FLONASE) 50 MCG/ACT nasal spray Place 1 spray into both nostrils daily.     montelukast (SINGULAIR) 10 MG tablet Take 10 mg by mouth at bedtime.     predniSONE (DELTASONE) 10 MG tablet Take 1 tablet (10 mg total) by mouth daily with breakfast.  ]   tamsulosin (FLOMAX) 0.4 MG CAPS capsule Take 2 capsules (0.8 mg total) by mouth daily after supper. 90 capsule 7   torsemide (DEMADEX) 20 MG tablet TAKE 2 TABLETS BY MOUTH 2 TIMES DAILY. 360 tablet 3   vitamin B-12 (CYANOCOBALAMIN) 500 MCG tablet Take 2 tablets (1,000 mcg total) by mouth daily.  warfarin (COUMADIN) 5 MG tablet TAKE 1/2 TO 1 TABLET BY MOUTH DAILY AS DIRECTED BY COUMADIN CLINIC 90 tablet 2   polyethylene glycol (MIRALAX / GLYCOLAX) packet Take 17 g by mouth daily as needed (constipation).  (Patient not taking: Reported on 11/25/2020)     potassium chloride SA (KLOR-CON) 20 MEQ tablet Take 2 tablets (40 mEq total) by mouth once for 1 dose. 2 tablet 0   No current facility-administered medications for this visit.    Allergies:   Carafate [sucralfate], Clarithromycin, Famotidine, Levaquin [levofloxacin], Omeprazole, Oxytetracycline, Penicillins, Proton pump inhibitors, Ranitidine, and Doxycycline   ROS:  Please see the history of present illness.   Otherwise, review of systems are positive for none.  All other systems are reviewed and negative.    PHYSICAL EXAM: VS:  BP 100/62   Pulse (!) 53   Wt 140 lb (63.5 kg)   SpO2 96%   BMI 24.03 kg/m  , BMI Body mass index is 24.03 kg/m. GENERAL:  Well appearing for his age NECK:  No jugular venous distention, waveform within normal limits, carotid upstroke brisk and symmetric, no bruits, no thyromegaly LUNGS:  Clear to auscultation bilaterally CHEST: Well-healed ICD pocket HEART:  PMI not displaced or sustained,S1 and S2 within normal limits, no S3, no clicks, no rubs, no murmurs, irregular  ABD:  Flat, positive bowel sounds normal in frequency in pitch, no bruits, no rebound, no  guarding, no midline pulsatile mass, no hepatomegaly, no splenomegaly EXT:  2 plus pulses throughout, no edema, no cyanosis no clubbing  EKG:  EKG is not ordered today. Atrial flutter, ventricular pacing, premature ventricular contractions  Recent Labs: 06/17/2020: ALT 24 06/19/2020: B Natriuretic Peptide 2,034.7 09/01/2020: Hemoglobin 12.1; NT-Pro BNP 22,792; Platelets 145 10/06/2020: BUN 24; Creatinine, Ser 1.32; Potassium 4.2; Sodium 138    Lipid Panel    Component Value Date/Time   CHOL 183 11/06/2018 1206   CHOL 115 09/01/2013 0507   TRIG 130.0 11/06/2018 1206   TRIG 120 09/01/2013 0507   HDL 49.50 11/06/2018 1206   CHOLHDL 4 11/06/2018 1206   VLDL 26.0 11/06/2018 1206   LDLCALC 107 (H) 11/06/2018 1206      Wt Readings from Last 3 Encounters:  11/25/20 140 lb (63.5 kg)  09/01/20 138 lb 6.4 oz (62.8 kg)  08/24/20 141 lb 3.2 oz (64 kg)      Other studies Reviewed: Additional studies/ records that were reviewed today include: Labs Review of the above records demonstrates: See elsewhere  ASSESSMENT AND PLAN:  Acute on chronic systolic HF -  He seems to be euvolemic today except for some mild lower extremity swelling.  At this point I think he should remain on the diuretic as listed.  He had labs done last month and they were quite stable.  No change in therapy.   Atrial flutter -   Mr. Kyros B Walraven has a CHA2DS2 - VASc score of 4.  He tolerates anticoagulation.  He does not really notice his arrhythmia.  Pulmonary HTN - He understands he has pulmonary hypertension.  It is severe.  We have decided to manage this conservatively.  ICD:  We have had discussions about turning off his defibrillator because he is afraid of getting shocked but at the same time I really do think he wants to be defibrillated if it ever happens and I think his family still wants this but we will continue these conversations in the future.  He has a good quality of life despite the  fact  that he is slowing down a little bit.   Current medicines are reviewed at length with the patient today.  The patient does not have concerns regarding medicines.  The following changes have been made: None  Labs/ tests ordered today include: None No orders of the defined types were placed in this encounter.   Disposition:   FU with me in 6 months  Signed, [name], MD  11/25/2020 11:20 AM    [place]

## 2020-11-25 ENCOUNTER — Encounter: Payer: Self-pay | Admitting: Cardiology

## 2020-11-25 ENCOUNTER — Other Ambulatory Visit: Payer: Self-pay | Admitting: Family Medicine

## 2020-11-25 ENCOUNTER — Ambulatory Visit: Payer: Medicare HMO | Admitting: Cardiology

## 2020-11-25 ENCOUNTER — Other Ambulatory Visit: Payer: Self-pay

## 2020-11-25 VITALS — BP 100/62 | HR 53 | Wt 140.0 lb

## 2020-11-25 DIAGNOSIS — I272 Pulmonary hypertension, unspecified: Secondary | ICD-10-CM

## 2020-11-25 DIAGNOSIS — Z95810 Presence of automatic (implantable) cardiac defibrillator: Secondary | ICD-10-CM

## 2020-11-25 DIAGNOSIS — I4892 Unspecified atrial flutter: Secondary | ICD-10-CM | POA: Diagnosis not present

## 2020-11-25 DIAGNOSIS — I5022 Chronic systolic (congestive) heart failure: Secondary | ICD-10-CM

## 2020-11-25 NOTE — Telephone Encounter (Signed)
Name of Medication: Alprazolam Name of Pharmacy: [facility] or Written Date and Quantity: 10/20/20, #30 Last Office Visit and Type: 07/01/20, hosp f/u Next Office Visit and Type: none Last Controlled Substance Agreement Date: none Last UDS: none

## 2020-11-25 NOTE — Telephone Encounter (Signed)
ERx 

## 2020-11-25 NOTE — Patient Instructions (Signed)
°  Follow-Up: °At [facility], you and your health needs are our priority.  As part of our continuing mission to provide you with exceptional heart care, we have created designated Provider Care Teams.  These Care Teams include your primary Cardiologist (physician) and Advanced Practice Providers (APPs -  Physician Assistants and Nurse Practitioners) who all work together to provide you with the care you need, when you need it. ° °We recommend signing up for the patient portal called "MyChart".  Sign up information is provided on this After Visit Summary.  MyChart is used to connect with patients for Virtual Visits (Telemedicine).  Patients are able to view lab/test results, encounter notes, upcoming appointments, etc.  Non-urgent messages can be sent to your provider as well.   °To learn more about what you can do with MyChart, go to [email or web address].   ° °Your next appointment:   °6 month(s) ° °The format for your next appointment:   °In Person ° °Provider:   °James Hochrein, MD   ° ° °

## 2020-11-28 ENCOUNTER — Ambulatory Visit
Admission: RE | Admit: 2020-11-28 | Discharge: 2020-11-28 | Disposition: A | Discharge status: Home / Self Care | Payer: Medicare HMO | Source: Ambulatory Visit | Attending: Gastroenterology | Admitting: Gastroenterology

## 2020-11-28 ENCOUNTER — Other Ambulatory Visit: Payer: Self-pay

## 2020-11-28 DIAGNOSIS — R1011 Right upper quadrant pain: Secondary | ICD-10-CM | POA: Diagnosis not present

## 2020-12-03 DIAGNOSIS — J019 Acute sinusitis, unspecified: Secondary | ICD-10-CM | POA: Diagnosis not present

## 2020-12-03 DIAGNOSIS — J209 Acute bronchitis, unspecified: Secondary | ICD-10-CM | POA: Diagnosis not present

## 2020-12-03 DIAGNOSIS — B9689 Other specified bacterial agents as the cause of diseases classified elsewhere: Secondary | ICD-10-CM | POA: Diagnosis not present

## 2020-12-04 ENCOUNTER — Encounter: Payer: Self-pay | Admitting: *Deleted

## 2020-12-04 ENCOUNTER — Other Ambulatory Visit: Payer: Self-pay

## 2020-12-04 ENCOUNTER — Telehealth: Payer: Self-pay | Admitting: Physician Assistant

## 2020-12-04 ENCOUNTER — Emergency Department: Payer: Medicare HMO

## 2020-12-04 DIAGNOSIS — R0602 Shortness of breath: Secondary | ICD-10-CM | POA: Diagnosis not present

## 2020-12-04 DIAGNOSIS — E876 Hypokalemia: Secondary | ICD-10-CM | POA: Diagnosis not present

## 2020-12-04 DIAGNOSIS — Z9111 Patient's noncompliance with dietary regimen: Secondary | ICD-10-CM

## 2020-12-04 DIAGNOSIS — Z66 Do not resuscitate: Secondary | ICD-10-CM | POA: Diagnosis present

## 2020-12-04 DIAGNOSIS — Z7901 Long term (current) use of anticoagulants: Secondary | ICD-10-CM

## 2020-12-04 DIAGNOSIS — I130 Hypertensive heart and chronic kidney disease with heart failure and stage 1 through stage 4 chronic kidney disease, or unspecified chronic kidney disease: Secondary | ICD-10-CM | POA: Diagnosis not present

## 2020-12-04 DIAGNOSIS — N179 Acute kidney failure, unspecified: Secondary | ICD-10-CM | POA: Diagnosis present

## 2020-12-04 DIAGNOSIS — Z9861 Coronary angioplasty status: Secondary | ICD-10-CM

## 2020-12-04 DIAGNOSIS — J9621 Acute and chronic respiratory failure with hypoxia: Secondary | ICD-10-CM | POA: Diagnosis present

## 2020-12-04 DIAGNOSIS — D849 Immunodeficiency, unspecified: Secondary | ICD-10-CM | POA: Diagnosis present

## 2020-12-04 DIAGNOSIS — I5023 Acute on chronic systolic (congestive) heart failure: Secondary | ICD-10-CM | POA: Diagnosis present

## 2020-12-04 DIAGNOSIS — D6959 Other secondary thrombocytopenia: Secondary | ICD-10-CM | POA: Diagnosis present

## 2020-12-04 DIAGNOSIS — Z20822 Contact with and (suspected) exposure to covid-19: Secondary | ICD-10-CM | POA: Diagnosis present

## 2020-12-04 DIAGNOSIS — E871 Hypo-osmolality and hyponatremia: Secondary | ICD-10-CM | POA: Diagnosis present

## 2020-12-04 DIAGNOSIS — E875 Hyperkalemia: Secondary | ICD-10-CM | POA: Diagnosis present

## 2020-12-04 DIAGNOSIS — I428 Other cardiomyopathies: Secondary | ICD-10-CM | POA: Diagnosis present

## 2020-12-04 DIAGNOSIS — J9811 Atelectasis: Secondary | ICD-10-CM | POA: Diagnosis not present

## 2020-12-04 DIAGNOSIS — J90 Pleural effusion, not elsewhere classified: Secondary | ICD-10-CM | POA: Diagnosis not present

## 2020-12-04 DIAGNOSIS — Z96611 Presence of right artificial shoulder joint: Secondary | ICD-10-CM | POA: Diagnosis present

## 2020-12-04 DIAGNOSIS — N400 Enlarged prostate without lower urinary tract symptoms: Secondary | ICD-10-CM | POA: Diagnosis present

## 2020-12-04 DIAGNOSIS — Z95810 Presence of automatic (implantable) cardiac defibrillator: Secondary | ICD-10-CM

## 2020-12-04 DIAGNOSIS — I517 Cardiomegaly: Secondary | ICD-10-CM | POA: Diagnosis not present

## 2020-12-04 DIAGNOSIS — I959 Hypotension, unspecified: Secondary | ICD-10-CM | POA: Diagnosis present

## 2020-12-04 DIAGNOSIS — N1831 Chronic kidney disease, stage 3a: Secondary | ICD-10-CM | POA: Diagnosis present

## 2020-12-04 DIAGNOSIS — F419 Anxiety disorder, unspecified: Secondary | ICD-10-CM | POA: Diagnosis present

## 2020-12-04 DIAGNOSIS — M109 Gout, unspecified: Secondary | ICD-10-CM | POA: Diagnosis present

## 2020-12-04 DIAGNOSIS — Z7952 Long term (current) use of systemic steroids: Secondary | ICD-10-CM

## 2020-12-04 DIAGNOSIS — T502X5A Adverse effect of carbonic-anhydrase inhibitors, benzothiadiazides and other diuretics, initial encounter: Secondary | ICD-10-CM | POA: Diagnosis not present

## 2020-12-04 DIAGNOSIS — I272 Pulmonary hypertension, unspecified: Secondary | ICD-10-CM | POA: Diagnosis present

## 2020-12-04 DIAGNOSIS — D6489 Other specified anemias: Secondary | ICD-10-CM | POA: Diagnosis present

## 2020-12-04 DIAGNOSIS — Z8249 Family history of ischemic heart disease and other diseases of the circulatory system: Secondary | ICD-10-CM

## 2020-12-04 DIAGNOSIS — K219 Gastro-esophageal reflux disease without esophagitis: Secondary | ICD-10-CM | POA: Diagnosis present

## 2020-12-04 DIAGNOSIS — J45909 Unspecified asthma, uncomplicated: Secondary | ICD-10-CM | POA: Diagnosis present

## 2020-12-04 DIAGNOSIS — Z79899 Other long term (current) drug therapy: Secondary | ICD-10-CM

## 2020-12-04 DIAGNOSIS — I4821 Permanent atrial fibrillation: Secondary | ICD-10-CM | POA: Diagnosis present

## 2020-12-04 LAB — BRAIN NATRIURETIC PEPTIDE: B Natriuretic Peptide: 2501.7 pg/mL — ABNORMAL HIGH (ref 0.0–100.0)

## 2020-12-04 LAB — BASIC METABOLIC PANEL
Anion gap: 11 (ref 5–15)
BUN: 38 mg/dL — ABNORMAL HIGH (ref 8–23)
CO2: 25 mmol/L (ref 22–32)
Calcium: 9.1 mg/dL (ref 8.9–10.3)
Chloride: 92 mmol/L — ABNORMAL LOW (ref 98–111)
Creatinine, Ser: 1.66 mg/dL — ABNORMAL HIGH (ref 0.61–1.24)
GFR, Estimated: 40 mL/min — ABNORMAL LOW (ref >60)
Glucose, Bld: 125 mg/dL — ABNORMAL HIGH (ref 70–99)
Potassium: 5.5 mmol/L — ABNORMAL HIGH (ref 3.5–5.1)
Sodium: 128 mmol/L — ABNORMAL LOW (ref 135–145)

## 2020-12-04 LAB — CBC
HCT: 37.2 % — ABNORMAL LOW (ref 39.0–52.0)
Hemoglobin: 12.9 g/dL — ABNORMAL LOW (ref 13.0–17.0)
MCH: 30.9 pg (ref 26.0–34.0)
MCHC: 34.7 g/dL (ref 30.0–36.0)
MCV: 89 fL (ref 80.0–100.0)
Platelets: 110 10*3/uL — ABNORMAL LOW (ref 150–400)
RBC: 4.18 MIL/uL — ABNORMAL LOW (ref 4.22–5.81)
RDW: 17.7 % — ABNORMAL HIGH (ref 11.5–15.5)
WBC: 8.8 10*3/uL (ref 4.0–10.5)
nRBC: 0.2 % (ref 0.0–0.2)

## 2020-12-04 LAB — TROPONIN I (HIGH SENSITIVITY)
Troponin I (High Sensitivity): 4 ng/L (ref <18)
Troponin I (High Sensitivity): 4 ng/L (ref <18)

## 2020-12-04 LAB — PROTIME-INR
INR: 2.1 — ABNORMAL HIGH (ref 0.8–1.2)
Prothrombin Time: 23.7 seconds — ABNORMAL HIGH (ref 11.4–15.2)

## 2020-12-04 NOTE — ED Triage Notes (Signed)
Pt says that his oxygen level dropped below 70 several times at home today. 96-100% while in triage on room air. He has felt more short of breath than usual, feels like his fluid is building up. 5lbs weight gain in a week.

## 2020-12-04 NOTE — Telephone Encounter (Signed)
The patient has gained 5 pounds in 2 days.  Patient is short of breath with abdominal bloating.  Per son oxygen is dropping intermittent low 60s.  He does not think oxygen is keeping above 90%.  Also patient is not able to void. I have recommended ER evaluation

## 2020-12-05 ENCOUNTER — Encounter: Payer: Self-pay | Admitting: Internal Medicine

## 2020-12-05 ENCOUNTER — Inpatient Hospital Stay
Admission: EM | Admit: 2020-12-05 | Discharge: 2020-12-10 | DRG: 291 | Disposition: A | Discharge status: Home / Self Care | Payer: Medicare HMO | Attending: Internal Medicine | Admitting: Internal Medicine

## 2020-12-05 DIAGNOSIS — I959 Hypotension, unspecified: Secondary | ICD-10-CM | POA: Diagnosis not present

## 2020-12-05 DIAGNOSIS — I10 Essential (primary) hypertension: Secondary | ICD-10-CM | POA: Diagnosis not present

## 2020-12-05 DIAGNOSIS — Z95810 Presence of automatic (implantable) cardiac defibrillator: Secondary | ICD-10-CM | POA: Diagnosis not present

## 2020-12-05 DIAGNOSIS — I428 Other cardiomyopathies: Secondary | ICD-10-CM | POA: Diagnosis not present

## 2020-12-05 DIAGNOSIS — I130 Hypertensive heart and chronic kidney disease with heart failure and stage 1 through stage 4 chronic kidney disease, or unspecified chronic kidney disease: Secondary | ICD-10-CM | POA: Diagnosis not present

## 2020-12-05 DIAGNOSIS — J4520 Mild intermittent asthma, uncomplicated: Secondary | ICD-10-CM | POA: Diagnosis not present

## 2020-12-05 DIAGNOSIS — E875 Hyperkalemia: Secondary | ICD-10-CM | POA: Diagnosis present

## 2020-12-05 DIAGNOSIS — M109 Gout, unspecified: Secondary | ICD-10-CM | POA: Diagnosis present

## 2020-12-05 DIAGNOSIS — Z8249 Family history of ischemic heart disease and other diseases of the circulatory system: Secondary | ICD-10-CM | POA: Diagnosis not present

## 2020-12-05 DIAGNOSIS — I509 Heart failure, unspecified: Secondary | ICD-10-CM

## 2020-12-05 DIAGNOSIS — E871 Hypo-osmolality and hyponatremia: Secondary | ICD-10-CM | POA: Diagnosis not present

## 2020-12-05 DIAGNOSIS — J45909 Unspecified asthma, uncomplicated: Secondary | ICD-10-CM | POA: Diagnosis present

## 2020-12-05 DIAGNOSIS — I5021 Acute systolic (congestive) heart failure: Secondary | ICD-10-CM | POA: Diagnosis not present

## 2020-12-05 DIAGNOSIS — D696 Thrombocytopenia, unspecified: Secondary | ICD-10-CM | POA: Diagnosis not present

## 2020-12-05 DIAGNOSIS — K219 Gastro-esophageal reflux disease without esophagitis: Secondary | ICD-10-CM | POA: Diagnosis present

## 2020-12-05 DIAGNOSIS — J9621 Acute and chronic respiratory failure with hypoxia: Secondary | ICD-10-CM | POA: Diagnosis present

## 2020-12-05 DIAGNOSIS — Z66 Do not resuscitate: Secondary | ICD-10-CM | POA: Diagnosis not present

## 2020-12-05 DIAGNOSIS — I5023 Acute on chronic systolic (congestive) heart failure: Secondary | ICD-10-CM | POA: Diagnosis present

## 2020-12-05 DIAGNOSIS — I4821 Permanent atrial fibrillation: Secondary | ICD-10-CM | POA: Diagnosis not present

## 2020-12-05 DIAGNOSIS — D6959 Other secondary thrombocytopenia: Secondary | ICD-10-CM | POA: Diagnosis present

## 2020-12-05 DIAGNOSIS — N1831 Chronic kidney disease, stage 3a: Secondary | ICD-10-CM | POA: Diagnosis not present

## 2020-12-05 DIAGNOSIS — R69 Illness, unspecified: Secondary | ICD-10-CM | POA: Diagnosis not present

## 2020-12-05 DIAGNOSIS — Z7901 Long term (current) use of anticoagulants: Secondary | ICD-10-CM | POA: Diagnosis not present

## 2020-12-05 DIAGNOSIS — N400 Enlarged prostate without lower urinary tract symptoms: Secondary | ICD-10-CM | POA: Diagnosis present

## 2020-12-05 DIAGNOSIS — I272 Pulmonary hypertension, unspecified: Secondary | ICD-10-CM | POA: Diagnosis not present

## 2020-12-05 DIAGNOSIS — J329 Chronic sinusitis, unspecified: Secondary | ICD-10-CM | POA: Diagnosis present

## 2020-12-05 DIAGNOSIS — R0602 Shortness of breath: Secondary | ICD-10-CM | POA: Diagnosis not present

## 2020-12-05 DIAGNOSIS — N179 Acute kidney failure, unspecified: Secondary | ICD-10-CM | POA: Diagnosis not present

## 2020-12-05 DIAGNOSIS — F419 Anxiety disorder, unspecified: Secondary | ICD-10-CM

## 2020-12-05 DIAGNOSIS — I482 Chronic atrial fibrillation, unspecified: Secondary | ICD-10-CM | POA: Diagnosis present

## 2020-12-05 DIAGNOSIS — Z20822 Contact with and (suspected) exposure to covid-19: Secondary | ICD-10-CM | POA: Diagnosis not present

## 2020-12-05 DIAGNOSIS — Z7952 Long term (current) use of systemic steroids: Secondary | ICD-10-CM | POA: Diagnosis not present

## 2020-12-05 DIAGNOSIS — I110 Hypertensive heart disease with heart failure: Secondary | ICD-10-CM | POA: Diagnosis not present

## 2020-12-05 DIAGNOSIS — D849 Immunodeficiency, unspecified: Secondary | ICD-10-CM | POA: Diagnosis not present

## 2020-12-05 DIAGNOSIS — I255 Ischemic cardiomyopathy: Secondary | ICD-10-CM | POA: Diagnosis not present

## 2020-12-05 DIAGNOSIS — N183 Chronic kidney disease, stage 3 unspecified: Secondary | ICD-10-CM | POA: Diagnosis present

## 2020-12-05 LAB — RESP PANEL BY RT-PCR (FLU A&B, COVID) ARPGX2
Influenza A by PCR: NEGATIVE
Influenza B by PCR: NEGATIVE
SARS Coronavirus 2 by RT PCR: NEGATIVE

## 2020-12-05 LAB — BASIC METABOLIC PANEL WITH GFR
Anion gap: 8 (ref 5–15)
BUN: 43 mg/dL — ABNORMAL HIGH (ref 8–23)
CO2: 26 mmol/L (ref 22–32)
Calcium: 8.8 mg/dL — ABNORMAL LOW (ref 8.9–10.3)
Chloride: 94 mmol/L — ABNORMAL LOW (ref 98–111)
Creatinine, Ser: 1.72 mg/dL — ABNORMAL HIGH (ref 0.61–1.24)
GFR, Estimated: 38 mL/min — ABNORMAL LOW
Glucose, Bld: 108 mg/dL — ABNORMAL HIGH (ref 70–99)
Potassium: 4.3 mmol/L (ref 3.5–5.1)
Sodium: 128 mmol/L — ABNORMAL LOW (ref 135–145)

## 2020-12-05 LAB — PROTIME-INR
INR: 1.9 — ABNORMAL HIGH (ref 0.8–1.2)
Prothrombin Time: 22 seconds — ABNORMAL HIGH (ref 11.4–15.2)

## 2020-12-05 MED ORDER — CEFDINIR 300 MG PO CAPS
300.0000 mg | ORAL_CAPSULE | ORAL | Status: DC
  Administered 2020-12-05: 300 mg via ORAL
  Filled 2020-12-05: qty 1

## 2020-12-05 MED ORDER — ALUM & MAG HYDROXIDE-SIMETH 200-200-20 MG/5ML PO SUSP: 15.0000 mL | Freq: Every day | ORAL | Status: DC | PRN

## 2020-12-05 MED ORDER — SODIUM ZIRCONIUM CYCLOSILICATE 10 G PO PACK
10.0000 g | PACK | Freq: Once | ORAL | Status: DC
  Filled 2020-12-05: qty 1

## 2020-12-05 MED ORDER — ACETAMINOPHEN 325 MG PO TABS
650.0000 mg | ORAL_TABLET | Freq: Four times a day (QID) | ORAL | Status: DC | PRN
  Administered 2020-12-06 – 2020-12-09 (×5): 650 mg via ORAL
  Filled 2020-12-05 (×5): qty 2

## 2020-12-05 MED ORDER — HYDRALAZINE HCL 20 MG/ML IJ SOLN: 5.0000 mg | INTRAMUSCULAR | Status: DC | PRN

## 2020-12-05 MED ORDER — WARFARIN - PHARMACIST DOSING INPATIENT
Freq: Every day | Status: DC
  Filled 2020-12-05: qty 1

## 2020-12-05 MED ORDER — MONTELUKAST SODIUM 10 MG PO TABS
10.0000 mg | ORAL_TABLET | Freq: Every day | ORAL | Status: DC
  Administered 2020-12-05 – 2020-12-09 (×5): 10 mg via ORAL
  Filled 2020-12-05 (×5): qty 1

## 2020-12-05 MED ORDER — AZELASTINE HCL 0.1 % NA SOLN
1.0000 | Freq: Two times a day (BID) | NASAL | Status: DC
  Administered 2020-12-05 – 2020-12-10 (×8): 1 via NASAL
  Filled 2020-12-05: qty 30

## 2020-12-05 MED ORDER — ONDANSETRON HCL 4 MG/2ML IJ SOLN
4.0000 mg | Freq: Three times a day (TID) | INTRAMUSCULAR | Status: DC | PRN
Start: 2020-12-05 — End: 2020-12-10
  Administered 2020-12-07: 4 mg via INTRAVENOUS
  Filled 2020-12-05: qty 2

## 2020-12-05 MED ORDER — FUROSEMIDE 10 MG/ML IJ SOLN
80.0000 mg | Freq: Two times a day (BID) | INTRAMUSCULAR | Status: AC
  Administered 2020-12-05 – 2020-12-09 (×9): 80 mg via INTRAVENOUS
  Filled 2020-12-05 (×10): qty 8

## 2020-12-05 MED ORDER — POLYETHYLENE GLYCOL 3350 17 G PO PACK
17.0000 g | PACK | Freq: Every day | ORAL | Status: DC
  Administered 2020-12-06 – 2020-12-08 (×3): 17 g via ORAL
  Filled 2020-12-05 (×4): qty 1

## 2020-12-05 MED ORDER — WARFARIN SODIUM 5 MG PO TABS
5.0000 mg | ORAL_TABLET | Freq: Once | ORAL | Status: AC
  Administered 2020-12-05: 5 mg via ORAL
  Filled 2020-12-05: qty 1

## 2020-12-05 MED ORDER — FUROSEMIDE 10 MG/ML IJ SOLN
80.0000 mg | Freq: Once | INTRAMUSCULAR | Status: AC
  Administered 2020-12-05: 80 mg via INTRAVENOUS
  Filled 2020-12-05: qty 8

## 2020-12-05 MED ORDER — FLUTICASONE PROPIONATE 50 MCG/ACT NA SUSP
1.0000 | Freq: Every day | NASAL | Status: DC
  Administered 2020-12-06 – 2020-12-10 (×2): 1 via NASAL
  Filled 2020-12-05 (×2): qty 16

## 2020-12-05 MED ORDER — DM-GUAIFENESIN ER 30-600 MG PO TB12: 1.0000 | ORAL_TABLET | Freq: Two times a day (BID) | ORAL | Status: DC | PRN

## 2020-12-05 MED ORDER — VITAMIN B-12 1000 MCG PO TABS
1000.0000 ug | ORAL_TABLET | Freq: Every day | ORAL | Status: DC
  Administered 2020-12-06 – 2020-12-10 (×5): 1000 ug via ORAL
  Filled 2020-12-05 (×5): qty 1

## 2020-12-05 MED ORDER — ALPRAZOLAM 0.5 MG PO TABS
0.2500 mg | ORAL_TABLET | Freq: Every evening | ORAL | Status: DC | PRN
  Administered 2020-12-05 – 2020-12-07 (×3): 0.25 mg via ORAL
  Filled 2020-12-05 (×3): qty 1

## 2020-12-05 MED ORDER — ALLOPURINOL 100 MG PO TABS
100.0000 mg | ORAL_TABLET | Freq: Every day | ORAL | Status: DC
  Administered 2020-12-06 – 2020-12-10 (×5): 100 mg via ORAL
  Filled 2020-12-05 (×6): qty 1

## 2020-12-05 MED ORDER — ALBUTEROL SULFATE (2.5 MG/3ML) 0.083% IN NEBU: 3.0000 mL | INHALATION_SOLUTION | RESPIRATORY_TRACT | Status: DC | PRN

## 2020-12-05 MED ORDER — PREDNISONE 10 MG PO TABS
5.0000 mg | ORAL_TABLET | Freq: Two times a day (BID) | ORAL | Status: DC
  Administered 2020-12-06 – 2020-12-09 (×7): 5 mg via ORAL
  Filled 2020-12-05 (×8): qty 1

## 2020-12-05 MED ORDER — HYDROCORTISONE NA SUCCINATE PF 100 MG IJ SOLR
50.0000 mg | Freq: Once | INTRAMUSCULAR | Status: AC
  Administered 2020-12-05: 50 mg via INTRAVENOUS
  Filled 2020-12-05: qty 2

## 2020-12-05 NOTE — ED Provider Notes (Signed)
[facility] Emergency Department Provider Note ____________________________________________   Event Date/Time   First MD Initiated Contact with Patient 12/05/20 [phone]     (approximate)  I have reviewed the triage vital signs and the nursing notes.   HISTORY  Chief Complaint Shortness of Breath    HPI [name] is a 87 y.o. male with PMH as noted below including CHF who presents with approximately 5 to 6 pound weight gain over the last several days, gradual onset, associated with increased shortness of breath and with several low oxygen readings.  It is also associated with leg swelling.  The patient states that several times while at rest he checked a pulse oximeter and noted that his oxygen dropped below 70%.  When this would happen he would feel lightheaded.  He also has shortness of breath on exertion.  He denies any acute chest pain.  He has been compliant with torsemide and saw his cardiologist a few days ago.   Past Medical History:  Diagnosis Date   AICD (automatic cardioverter/defibrillator) present    s/p gen change 04/2015 w/ MDT [name] Crt-D  DTBA1D1, Serial number [id number]   Allergic rhinitis    [name]   Anemia    Anxiety    Arthritis    Atrial flutter ([place])    A.  Status post cardioversion; B.  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 Lucky Cowboy)   Chronic lung disease    spirometry 2015 - no obstruction, + mild restrictive lung disease   Chronic sinusitis    Chronic systolic congestive heart failure ([place])    Dyspnea    with exertion   Extrinsic asthma    [name]   Fall with injury 05/28/2017   GERD (gastroesophageal reflux disease) 2010   h/o esophageal stricture with dilation, LA grade C reflux esophagitis by EGD 2010   Goiter    History of diverticulitis of  colon 06/2020   History of GI bleed    Secondary to hemorrhoids   History of seasonal allergies    Hypertension    IBS (irritable bowel syndrome)    LBBB (left bundle branch block)    Multiple pulmonary nodules 06/2013   RUL ([facility] Adam Phenix [facility]) - ?vasculitis as of last CT at [facility]   NICM (nonischemic cardiomyopathy) [facility])    Cardiac catheterization March 2006 without coronary disease; EF 25% 2018 Jan   Orthopnea    Porphyria [facility])    Presence of permanent cardiac pacemaker    Sinus congestion 09/25/2010    Patient Active Problem List   Diagnosis Date Noted   Acute on chronic HFrEF (heart failure with reduced ejection fraction) ([place])    Chronic atrial fibrillation ([place]) 02/16/2020   Prediabetes 11/22/2019   Pedal edema 11/16/2019   Decreased visual acuity 11/16/2019   Low back pain radiating to right lower extremity 07/09/2019   Health maintenance examination 11/11/2018   Chronic lung disease    Nausea 11/04/2017   Abnormal x-ray 10/28/2017   Pulmonary HTN ([place]) 03/25/2017   Aortic valve regurgitation 03/25/2017   Other fatigue 12/04/2016   Nondisplaced fracture of greater trochanter of left femur ([place]) 10/30/2016   Status post reverse total arthroplasty of right shoulder 01/17/2016   Complete tear of right rotator cuff 12/10/2015  Acute sinusitis 03/18/2015   Complete tear of left rotator cuff 05/07/2014   Warfarin anticoagulation 05/07/2014   Advanced care planning/counseling discussion 03/16/2014   Chronic constipation 09/09/2013   Hyperglycemia 09/09/2013   Medicare annual wellness visit, subsequent 04/22/2012   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[place]) 10/08/2011   Left sided sciatica 06/11/2011   Thrombocytopenia ([place]) 02/25/2011   Vitamin B12 deficiency 02/22/2011   Congestion of respiratory tract 09/25/2010   Long term current use of anticoagulant 06/27/2010   ESOPHAGEAL STRICTURE 02/16/2010   CHEST PAIN 01/03/2010   Atrial flutter ([place]) 12/20/2008    Anxiety 12/08/2008   Secondary cardiomyopathy ([place]) 09/13/2008   LBBB 09/13/2008   Automatic implantable cardioverter-defibrillator in situ 09/13/2008   PORPHYRIA [id number]   Chronic systolic heart failure ([place]) 12/16/2006   Allergic rhinitis 12/16/2006   GERD 12/16/2006   BENIGN PROSTATIC HYPERTROPHY 12/16/2006   IBS 12/08/1997    Past Surgical History:  Procedure Laterality Date   BACK SURGERY  1997   bulging disks   BiV-AICD implant     [employer]   CARDIAC CATHETERIZATION  06/22/04   Severe, nonischemic cardiomyopathy,EF 25-30%   CARDIOVERSION  02/22/09   AFlutter-([facility])   CATARACT EXTRACTION W/PHACO Right 12/21/2019   Procedure: CATARACT EXTRACTION PHACO AND INTRAOCULAR LENS PLACEMENT (IOC) RIGHT 2.23  00:21.9;  Surgeon: Eulogio Bear, MD;  Location: [place];  Service: Ophthalmology;  Laterality: Right;   CATARACT EXTRACTION W/PHACO Left 02/01/2020   Procedure: CATARACT EXTRACTION PHACO AND INTRAOCULAR LENS PLACEMENT (IOC) LEFT 3.67 00:39.5;  Surgeon: Eulogio Bear, MD;  Location: [place];  Service: Ophthalmology;  Laterality: Left;   CHOLECYSTECTOMY     COLONOSCOPY  10/99   Divertic, splenic, hepatic fleure only   COLONOSCOPY  10/21/08   aborted-divertics, int hemms (Dr. Vira Agar)   CORONARY ANGIOPLASTY     EP IMPLANTABLE DEVICE N/A 04/25/2015   Procedure: BIV ICD Generator Changeout;  Surgeon: Evans Lance, MD;  Location: [place] [facility];  Service: Cardiovascular;  Laterality: N/A;   ESOPHAGEAL DILATION  02/18/98   ESOPHAGOGASTRODUODENOSCOPY  01/1998   stricture, sliding HH, GERD   ESOPHAGOGASTRODUODENOSCOPY  04/08/01   stricture, gastritis, HH, GERD-no dilation(Dr. Henrene Pastor)   ESOPHAGOGASTRODUODENOSCOPY  12/22/04   stricture; gastritis; duodenitis, GERD   ESOPHAGOGASTRODUODENOSCOPY  10/21/08   Reflux esophagitis; Erythem. Duod. (Dr. Vira Agar)   ESOPHAGOGASTRODUODENOSCOPY  08/2013   erythema gastric fundus - minimal gastritis, HH ([place])    ESOPHAGOGASTRODUODENOSCOPY  08/2016   dysphagia - mod schatzki rin, dilated Tiffany Kocher)   ESOPHAGOGASTRODUODENOSCOPY (EGD) WITH PROPOFOL N/A 08/24/2016   mod schatzki ring dilated, duodenal deformity Manya Silvas, MD)   goiter removal     HERNIA REPAIR     HERNIA REPAIR  01/30/02   Dr. Smith Mince / REPLACE / REMOVE PACEMAKER  2007 and 2017   JOINT REPLACEMENT Right    shoulder   NOSE SURGERY  1971   sinus surgery   PENILE PROSTHESIS IMPLANT  2007   [place]   PFT  10/2013   FVC 61%, FEV1 63%, ratio 0.76   Pulmonary eval  04/2002   ([facility]) Chronic congestive symptoms   REVERSE SHOULDER ARTHROPLASTY Right 01/17/2016   Procedure: REVERSE SHOULDER ARTHROPLASTY;  Surgeon: Corky Mull, MD;  Location: [facility];  Service: Orthopedics;  Laterality: Right;   REVERSE TOTAL SHOULDER ARTHROPLASTY Right 01/2016   [place]   US ECHOCARDIOGRAPHY  07/13/04   EF 25-30%, Mod LVH; LA severe dilation; Mild AR; IRTR   US ECHOCARDIOGRAPHY  11/27/06  hypokinesis posterior wall, EF 35%; mild AR    Prior to Admission medications   Medication Sig Start Date End Date Taking? Authorizing Provider  acetaminophen (TYLENOL) 500 MG tablet Take 250 mg by mouth daily as needed. Pain     [provider]  allopurinol (ZYLOPRIM) 100 MG tablet Take 100 mg by mouth daily. Per patient taking 2 tablets (200 mg)    [provider]  ALPRAZolam (XANAX) 0.25 MG tablet TAKE 1/2 TO 1 TABLET (0.125-0.25 MG TOTAL) BY MOUTH AT BEDTIME AS NEEDED FOR ANXIETY OR SLEEP. 11/25/20   Ria Bush, MD  alum & mag hydroxide-simeth (MAALOX/MYLANTA) 200-200-20 MG/5ML suspension Take 15 mLs by mouth daily as needed for indigestion or heartburn.    [provider]  carvedilol (COREG) 6.25 MG tablet TAKE 1 AND 1/2 TABLETS BY MOUTH TWICE DAILY 04/04/20   [name], MD  fluticasone (FLONASE) 50 MCG/ACT nasal spray Place 1 spray into both nostrils daily.    [provider]  montelukast (SINGULAIR) 10 MG  tablet Take 10 mg by mouth at bedtime.    [provider]  polyethylene glycol (MIRALAX / GLYCOLAX) packet Take 17 g by mouth daily as needed (constipation).  Patient not taking: Reported on 11/25/2020    [provider]  potassium chloride SA (KLOR-CON) 20 MEQ tablet Take 2 tablets (40 mEq total) by mouth once for 1 dose. 09/23/20 09/23/20  [name], MD  predniSONE (DELTASONE) 10 MG tablet Take 1 tablet (10 mg total) by mouth daily with breakfast. 07/01/20   Ria Bush, MD  tamsulosin (FLOMAX) 0.4 MG CAPS capsule Take 2 capsules (0.8 mg total) by mouth daily after supper. 07/06/20   Billey Co, MD  torsemide (DEMADEX) 20 MG tablet TAKE 2 TABLETS BY MOUTH 2 TIMES DAILY. 09/09/20   [name], MD  vitamin B-12 (CYANOCOBALAMIN) 500 MCG tablet Take 2 tablets (1,000 mcg total) by mouth daily. 11/16/19   Ria Bush, MD  warfarin (COUMADIN) 5 MG tablet TAKE 1/2 TO 1 TABLET BY MOUTH DAILY AS DIRECTED BY COUMADIN CLINIC 12/30/19   Evans Lance, MD    Allergies Carafate [sucralfate], Clarithromycin, Famotidine, Levaquin [levofloxacin], Omeprazole, Oxytetracycline, Penicillins, Proton pump inhibitors, Ranitidine, and Doxycycline  Family History  Problem Relation Age of Onset   Coronary artery disease Father    Hypertension Father    Heart failure Mother    Dysphagia Sister    Breast cancer Other    Ovarian cancer Other    Uterine cancer Other    Other Sister        stomach problems   Other Sister        stomach problems   Other Sister        stomach problems   Alcohol abuse Maternal Uncle     Social History Social History   Tobacco Use   Smoking status: Never   Smokeless tobacco: Never  Vaping Use   Vaping Use: Never used  Substance Use Topics   Alcohol use: No   Drug use: No    Review of Systems  Constitutional: No fever. Eyes: No redness ENT: No sore throat. Cardiovascular: Denies chest pain. Respiratory: Positive for shortness of  breath. Gastrointestinal: No vomiting or diarrhea.  Genitourinary: Negative for dysuria.  Musculoskeletal: Negative for back pain. Skin: Negative for rash. Neurological: Negative for headache.   ____________________________________________   PHYSICAL EXAM:  VITAL SIGNS: ED Triage Vitals  Enc Vitals Group     BP 12/04/20 1952 105/74  Pulse Rate 12/04/20 1952 65     Resp 12/04/20 1952 (!) 24     Temp 12/04/20 1952 97.6 F (36.4 C)     Temp Source 12/04/20 1952 Oral     SpO2 12/04/20 1952 98 %     Weight 12/04/20 1953 140 lb (63.5 kg)     Height 12/04/20 1953 '5\' 4"'$  (1.626 m)     Head Circumference --      Peak Flow --      Pain Score 12/04/20 1953 0     Pain Loc --      Pain Edu? --      Excl. in [place]? --     Constitutional: Alert and oriented. Well appearing for age and in no acute distress. Eyes: Conjunctivae are normal.  Head: Atraumatic. Nose: No congestion/rhinnorhea. Mouth/Throat: Mucous membranes are moist.   Neck: Normal range of motion.  Cardiovascular: Normal rate, regular rhythm. Grossly normal heart sounds.  Good peripheral circulation. Respiratory: Normal respiratory effort.  No retractions.  Decreased breath sounds to bilateral bases Gastrointestinal: No distention.  Musculoskeletal: 2+ bilateral lower extremity edema.  Extremities warm and well perfused.  Neurologic:  Normal speech and language. No gross focal neurologic deficits are appreciated.  Skin:  Skin is warm and dry. No rash noted. Psychiatric: Mood and affect are normal. Speech and behavior are normal.  ____________________________________________   LABS (all labs ordered are listed, but only abnormal results are displayed)  Labs Reviewed  BASIC METABOLIC PANEL - Abnormal; Notable for the following components:      Result Value   Sodium 128 (*)    Potassium 5.5 (*)    Chloride 92 (*)    Glucose, Bld 125 (*)    BUN 38 (*)    Creatinine, Ser 1.66 (*)    GFR, Estimated 40 (*)    All  other components within normal limits  CBC - Abnormal; Notable for the following components:   RBC 4.18 (*)    Hemoglobin 12.9 (*)    HCT 37.2 (*)    RDW 17.7 (*)    Platelets 110 (*)    All other components within normal limits  PROTIME-INR - Abnormal; Notable for the following components:   Prothrombin Time 23.7 (*)    INR 2.1 (*)    All other components within normal limits  BRAIN NATRIURETIC PEPTIDE - Abnormal; Notable for the following components:   B Natriuretic Peptide 2,501.7 (*)    All other components within normal limits  RESP PANEL BY RT-PCR (FLU A&B, COVID) ARPGX2  TROPONIN I (HIGH SENSITIVITY)  TROPONIN I (HIGH SENSITIVITY)   ____________________________________________  EKG  ED ECG REPORT I, Arta Silence, the attending physician, personally viewed and interpreted this ECG.  Date: 12/05/2020 EKG Time: 1954 Rate: 67 Rhythm: Ventricular paced rhythm QRS Axis: normal Intervals: normal ST/T Wave abnormalities: Nonspecific abnormalities Narrative Interpretation: Paced rhythm with no evidence of acute ischemia  ____________________________________________  RADIOLOGY  Chest x-ray interpreted by me shows cardiomegaly and bilateral pleural effusions  ____________________________________________   PROCEDURES  Procedure(s) performed: No  Procedures  Critical Care performed: No ____________________________________________   INITIAL IMPRESSION / ASSESSMENT AND PLAN / ED COURSE  Pertinent labs & imaging results that were available during my care of the patient were reviewed by me and considered in my medical decision making (see chart for details).   87 year old male with PMH as noted above including CHF with an EF of 20 to 25% presents with weight gain, shortness of breath, leg  swelling, and several episodes of hypoxia at home.  He states he has been compliant with medications.  I reviewed the past medical records in epic.  The patient was most  recently seen by his cardiologist Dr. [name] on 8/19.  At that time he was not having any acute weight gain or significant shortness of breath.  He was most recently admitted in March with shortness of breath and received IV Lasix.  He states he feels similarly to that presentation.  On exam the patient is overall well-appearing.  His vital signs are normal.  O2 saturation is currently in the 90s on room air.  He has diminished breath sounds to bilateral bases and peripheral edema as above.  Overall presentation is consistent with acute on chronic CHF.  Although the chest x-ray does not show frank edema, the patient has gained weight and his BNP is elevated.  Given the multiple episodes of hypoxia I think it would be safest to admit the patient for IV Lasix.  He also has some electrolyte abnormalities.  Potassium is elevated but I do not see any concerning EKG findings.  We will give IV Lasix for diuresis and plan for admission.  ----------------------------------------- 4:26 AM on 12/05/2020 -----------------------------------------  I consulted Dr. Sidney Ace from the hospitalist service for admission.   ____________________________________________   FINAL CLINICAL IMPRESSION(S) / ED DIAGNOSES  Final diagnoses:  Acute on chronic congestive heart failure, unspecified heart failure type ([place])      NEW MEDICATIONS STARTED DURING THIS VISIT:  New Prescriptions   No medications on file     Note:  This document was prepared using Dragon voice recognition software and may include unintentional dictation errors.    Arta Silence, MD 12/05/20 [phone]

## 2020-12-05 NOTE — Consult Note (Signed)
ANTICOAGULATION CONSULT NOTE - Initial Consult  Pharmacy Consult for warfarin Indication: atrial flutter   Allergies  Allergen Reactions   Carafate [Sucralfate] Other (See Comments)    Burns stomach   Clarithromycin Nausea Only   Famotidine Other (See Comments)    ABD. PAIN   Levaquin [Levofloxacin] Other (See Comments)    Stomach pain, has to eat a lot of food   Omeprazole Diarrhea   Oxytetracycline Other (See Comments)    BUMPS   Penicillins Swelling    Amoxicillin ok, Pt states he had to have a shot to reverse the reaction Has patient had a PCN reaction causing immediate rash, facial/tongue/throat swelling, SOB or lightheadedness with hypotension: Yes Has patient had a PCN reaction causing severe rash involving mucus membranes or skin necrosis: No Has patient had a PCN reaction that required hospitalization No Has patient had a PCN reaction occurring within the last 10 years: Yes If all of the above answers are "NO", then may proceed with   Proton Pump Inhibitors Other (See Comments)    GI upset, diarrhea, gas, bloating   Ranitidine Diarrhea   Doxycycline Rash    Patient Measurements: Height: '5\' 4"'$  (162.6 cm) Weight: 63.5 kg (140 lb) IBW/kg (Calculated) : 59.2  Vital Signs: BP: 111/66 (08/29 0830) Pulse Rate: 58 (08/29 0830)  Labs: Recent Labs    12/04/20 1956 12/04/20 2242 12/05/20 0838  HGB 12.9*  --   --   HCT 37.2*  --   --   PLT 110*  --   --   LABPROT 23.7*  --   --   INR 2.1*  --   --   CREATININE 1.66*  --  1.72*  TROPONINIHS 4 4  --     Estimated Creatinine Clearance: 25.3 mL/min (A) (by C-G formula based on SCr of 1.72 mg/dL (H)).   Medical History: Past Medical History:  Diagnosis Date   AICD (automatic cardioverter/defibrillator) present    s/p gen change 04/2015 w/ MDT [name] Crt-D  DTBA1D1, Serial number [id number]   Allergic rhinitis    [name]   Anemia    Anxiety    Arthritis    Atrial flutter ([place])    A.  Status post  cardioversion; B.  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 Lucky Cowboy)   Chronic lung disease    spirometry 2015 - no obstruction, + mild restrictive lung disease   Chronic sinusitis    Chronic systolic congestive heart failure ([place])    Dyspnea    with exertion   Extrinsic asthma    [name]   Fall with injury 05/28/2017   GERD (gastroesophageal reflux disease) 2010   h/o esophageal stricture with dilation, LA grade C reflux esophagitis by EGD 2010   Goiter    History of diverticulitis of colon 06/2020   History of GI bleed    Secondary to hemorrhoids   History of seasonal allergies    Hypertension    IBS (irritable bowel syndrome)    LBBB (left bundle branch block)    Multiple pulmonary nodules 06/2013   RUL ([facility] Adam Phenix [facility]) - ?vasculitis as of last CT at [facility]   NICM (nonischemic cardiomyopathy) [facility])    Cardiac catheterization March 2006 without coronary disease; EF 25% 2018 Jan   Orthopnea  Porphyria ([place])    Presence of permanent cardiac pacemaker    Sinus congestion 09/25/2010    Medications:  Warfarin PTA: 5 mg Monday, Friday 2.5 mg all other days TWD = 22.5 mg   Assessment: 87 y.o. male with PMH of atrial flutter on chronic warfarin therapy of outpatient regimen outline above. Presented with SOB with associated leg swelling. INR 2.1 (therapeutic) on admission.  DD Interactions: Patient started on 10 day course of cefdinir 300 mg BID 12/03/20 PTA prednisone (10 mg daily) temporarily increased to 10 mg BID x5 days on 12/03/20 for sinusitis   Date INR Warfarin Dose  8/27 -- 2.5 mg (PTA) 8/28 2.1 No dose given 8/29 1.9 5 MG   Goal of Therapy:  INR 2-3 (Per pt Pulmonologist wants INR around 2.0) Monitor platelets by anticoagulation protocol: Yes   Plan:  INR 1.9 (subtherapeutic)  following missing dose 8/28 Give 5 mg x 1 today Anticipate INR to be lower tomorrow due to missed Sunday dose INR daily    Dorothe Pea, PharmD, BCPS Clinical Pharmacist   12/05/2020,11:06 AM

## 2020-12-05 NOTE — Progress Notes (Signed)
PHARMACY NOTE:  ANTIMICROBIAL RENAL DOSAGE ADJUSTMENT  Current antimicrobial regimen includes a mismatch between antimicrobial dosage and estimated renal function.  As per policy approved by the Pharmacy & Therapeutics and Medical Executive Committees, the antimicrobial dosage will be adjusted accordingly.  Current antimicrobial dosage:  Cefdinir 300 mg q12h  Indication: sinus infection  Renal Function:  Estimated Creatinine Clearance: 25.3 mL/min (A) (by C-G formula based on SCr of 1.72 mg/dL (H)).    Antimicrobial dosage has been changed to:  Cefdinir 300 mg q24h  Additional comments:   Thank you for allowing pharmacy to be a part of this patient's care.  [place], [facility] 12/05/2020 7:13 PM

## 2020-12-05 NOTE — ED Notes (Signed)
Secure chat message sent to provider regarding 1600 dose of Lasix '80mg'$  IVP that is ordered.  Patients blood pressure is 98/70 and has been in the [id number] systolic, he confirms he wants med to be given.

## 2020-12-05 NOTE — ED Notes (Signed)
MD notified patient has only had 100cc of output since lasix administration.   Pt c/o decreased urine output for 5 days.

## 2020-12-05 NOTE — H&P (Signed)
History and Physical    [name] [id number] DOB: 1931-04-12 DOA: 12/05/2020  Referring MD/NP/PA:   PCP: Ria Bush, MD   Patient coming from:  The patient is coming from home.  At baseline, pt is independent for most of ADL.        Chief Complaint: SOB and weight gain  HPI: [name] is a 87 y.o. male with medical history significant of sCHF with EF of 20-25%, hypertension, asthma, GERD, gout, anxiety, AICD placement, left bundle blockage, porphyruria, IBS, GI bleeding, diverticulitis, BPH, atrial fibrillation on Coumadin, anemia, aortic valve regurgitation, esophageal stricture, pulmonary hypertension, thrombocytopenia, CKD-3A, sinus infection, who presents with shortness breath and weight gain  Patient states that he has gained more than 5 pounds in the past 5 days, he developed shortness of breath, which has been progressively worsening.  Patient has cough with yellow-colored mucus production.  No chest pain, fever or chills.  Patient states that he is not using oxygen at baseline at home.  He had 1 episode of oxygen desaturation to 60s-70s at home.  His oxygen saturations 82% on room air in ED. Patient had nausea, dry heaves, no diarrhea or abdominal pain.  No symptoms of UTI.  Patient states that he has sinus infection, and was started with cefdinir on 8/27.  He is still taking prednisone 5 mg twice a day which was prescribed by pulmonologist for "clearing my lung", possibly for asthma.  ED Course: pt was found to have BNP 2501, troponin level 4, 4, INR 2.1, negative COVID PCR, slightly worsening renal function, sodium 128, potassium 5.5, temperature normal, blood pressure 95/57, heart rate 45-110, RR 24. Chest x-ray showed cardiomegaly and bilateral small pleural effusion.  Patient is admitted to progressive bed as inpatient  Review of Systems:   General: no fevers, chills, has body weight gain, has fatigue HEENT: no blurry vision, hearing changes or  sore throat Respiratory: no dyspnea, coughing, no wheezing CV: no chest pain, no palpitations GI: no nausea, no vomiting, abdominal pain, diarrhea, constipation GU: no dysuria, burning on urination, increased urinary frequency, hematuria  Ext: has leg edema Neuro: no unilateral weakness, numbness, or tingling, no vision change or hearing loss Skin: no rash, no skin tear. MSK: No muscle spasm, no deformity, no limitation of range of movement in spin Heme: No easy bruising.  Travel history: No recent long distant travel.  Allergy:  Allergies  Allergen Reactions   Carafate [Sucralfate] Other (See Comments)    Burns stomach   Clarithromycin Nausea Only   Famotidine Other (See Comments)    ABD. PAIN   Levaquin [Levofloxacin] Other (See Comments)    Stomach pain, has to eat a lot of food   Omeprazole Diarrhea   Oxytetracycline Other (See Comments)    BUMPS   Penicillins Swelling    Amoxicillin ok, Pt states he had to have a shot to reverse the reaction Has patient had a PCN reaction causing immediate rash, facial/tongue/throat swelling, SOB or lightheadedness with hypotension: Yes Has patient had a PCN reaction causing severe rash involving mucus membranes or skin necrosis: No Has patient had a PCN reaction that required hospitalization No Has patient had a PCN reaction occurring within the last 10 years: Yes If all of the above answers are "NO", then may proceed with   Proton Pump Inhibitors Other (See Comments)    GI upset, diarrhea, gas, bloating   Ranitidine Diarrhea   Doxycycline Rash    Past Medical History:  Diagnosis Date  AICD (automatic cardioverter/defibrillator) present    s/p gen change 04/2015 w/ MDT [name] Crt-D  DTBA1D1, Serial number [id number]   Allergic rhinitis    Donneta Romberg   Anemia    Anxiety    Arthritis    Atrial flutter ([place])    A.  Status post cardioversion; B.  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 Lucky Cowboy)   Chronic lung disease    spirometry 2015 - no obstruction, + mild restrictive lung disease   Chronic sinusitis    Chronic systolic congestive heart failure ([place])    Dyspnea    with exertion   Extrinsic asthma    [name]   Fall with injury 05/28/2017   GERD (gastroesophageal reflux disease) 2010   h/o esophageal stricture with dilation, LA grade C reflux esophagitis by EGD 2010   Goiter    History of diverticulitis of colon 06/2020   History of GI bleed    Secondary to hemorrhoids   History of seasonal allergies    Hypertension    IBS (irritable bowel syndrome)    LBBB (left bundle branch block)    Multiple pulmonary nodules 06/2013   RUL ([facility]. Adam Phenix [facility]) - ?vasculitis as of last CT at [facility]   NICM (nonischemic cardiomyopathy) [facility])    Cardiac catheterization March 2006 without coronary disease; EF 25% 2018 Jan   Orthopnea    Porphyria [facility])    Presence of permanent cardiac pacemaker    Sinus congestion 09/25/2010    Past Surgical History:  Procedure Laterality Date   BACK SURGERY  1997   bulging disks   BiV-AICD implant     Medtronic   CARDIAC CATHETERIZATION  06/22/04   Severe, nonischemic cardiomyopathy,EF 25-30%   CARDIOVERSION  02/22/09   AFlutter-([facility])   CATARACT EXTRACTION W/PHACO Right 12/21/2019   Procedure: CATARACT EXTRACTION PHACO AND INTRAOCULAR LENS PLACEMENT (IOC) RIGHT 2.23  00:21.9;  Surgeon: Eulogio Bear, MD;  Location: [place];  Service: Ophthalmology;  Laterality: Right;   CATARACT EXTRACTION W/PHACO Left 02/01/2020   Procedure: CATARACT EXTRACTION PHACO AND INTRAOCULAR LENS PLACEMENT (IOC) LEFT 3.67 00:39.5;  Surgeon: Eulogio Bear, MD;  Location: [place];  Service: Ophthalmology;  Laterality: Left;   CHOLECYSTECTOMY     COLONOSCOPY  10/99   Divertic, splenic, hepatic fleure  only   COLONOSCOPY  10/21/08   aborted-divertics, int hemms (Dr. Vira Agar)   CORONARY ANGIOPLASTY     EP IMPLANTABLE DEVICE N/A 04/25/2015   Procedure: BIV ICD Generator Changeout;  Surgeon: Evans Lance, MD;  Location: [place] [facility];  Service: Cardiovascular;  Laterality: N/A;   ESOPHAGEAL DILATION  02/18/98   ESOPHAGOGASTRODUODENOSCOPY  01/1998   stricture, sliding HH, GERD   ESOPHAGOGASTRODUODENOSCOPY  04/08/01   stricture, gastritis, HH, GERD-no dilation(Dr. Henrene Pastor)   ESOPHAGOGASTRODUODENOSCOPY  12/22/04   stricture; gastritis; duodenitis, GERD   ESOPHAGOGASTRODUODENOSCOPY  10/21/08   Reflux esophagitis; Erythem. Duod. (Dr. Vira Agar)   ESOPHAGOGASTRODUODENOSCOPY  08/2013   erythema gastric fundus - minimal gastritis, HH ([place])   ESOPHAGOGASTRODUODENOSCOPY  08/2016   dysphagia - mod schatzki rin, dilated Tiffany Kocher)   ESOPHAGOGASTRODUODENOSCOPY (EGD) WITH PROPOFOL N/A 08/24/2016   mod schatzki ring dilated, duodenal deformity Vira Agar, Gavin Pound, MD)   goiter removal     HERNIA REPAIR  HERNIA REPAIR  01/30/02   Dr. Smith Mince / REPLACE / REMOVE PACEMAKER  2007 and 2017   JOINT REPLACEMENT Right    shoulder   NOSE SURGERY  1971   sinus surgery   PENILE PROSTHESIS IMPLANT  2007   [place]   PFT  10/2013   FVC 61%, FEV1 63%, ratio 0.76   Pulmonary eval  04/2002   ([facility]) Chronic congestive symptoms   REVERSE SHOULDER ARTHROPLASTY Right 01/17/2016   Procedure: REVERSE SHOULDER ARTHROPLASTY;  Surgeon: Corky Mull, MD;  Location: [facility];  Service: Orthopedics;  Laterality: Right;   REVERSE TOTAL SHOULDER ARTHROPLASTY Right 01/2016   Poggi   US ECHOCARDIOGRAPHY  07/13/04   EF 25-30%, Mod LVH; LA severe dilation; Mild AR; IRTR   US ECHOCARDIOGRAPHY  11/27/06   hypokinesis posterior wall, EF 35%; mild AR    Social History:  reports that he has never smoked. He has never used smokeless tobacco. He reports that he does not drink alcohol and does not use drugs.  Family History:   Family History  Problem Relation Age of Onset   Coronary artery disease Father    Hypertension Father    Heart failure Mother    Dysphagia Sister    Breast cancer Other    Ovarian cancer Other    Uterine cancer Other    Other Sister        stomach problems   Other Sister        stomach problems   Other Sister        stomach problems   Alcohol abuse Maternal Uncle      Prior to Admission medications   Medication Sig Start Date End Date Taking? Authorizing Provider  acetaminophen (TYLENOL) 500 MG tablet Take 250 mg by mouth daily as needed. Pain    Yes [provider]  allopurinol (ZYLOPRIM) 100 MG tablet Take 100 mg by mouth daily. Per patient taking 2 tablets (200 mg)   Yes [provider]  ALPRAZolam (XANAX) 0.25 MG tablet TAKE 1/2 TO 1 TABLET (0.125-0.25 MG TOTAL) BY MOUTH AT BEDTIME AS NEEDED FOR ANXIETY OR SLEEP. 11/25/20  Yes Ria Bush, MD  alum & mag hydroxide-simeth (MAALOX/MYLANTA) 200-200-20 MG/5ML suspension Take 15 mLs by mouth daily as needed for indigestion or heartburn.   Yes [provider]  Azelastine HCl 137 MCG/SPRAY SOLN Place 1 spray into both nostrils 2 (two) times daily. 11/27/20  Yes [provider]  carvedilol (COREG) 6.25 MG tablet TAKE 1 AND 1/2 TABLETS BY MOUTH TWICE DAILY 04/04/20  Yes [name], MD  cefdinir (OMNICEF) 300 MG capsule Take 1 capsule by mouth 2 (two) times daily. 12/03/20 12/13/20 Yes [provider]  fluticasone (FLONASE) 50 MCG/ACT nasal spray Place 1 spray into both nostrils daily.   Yes [provider]  montelukast (SINGULAIR) 10 MG tablet Take 10 mg by mouth at bedtime.   Yes [provider]  predniSONE (DELTASONE) 5 MG tablet Take 5 mg by mouth 2 (two) times daily. 11/17/20  Yes [provider]  tamsulosin (FLOMAX) 0.4 MG CAPS capsule Take 2 capsules (0.8 mg total) by mouth daily after supper. 07/06/20  Yes Billey Co, MD  torsemide (DEMADEX) 20 MG  tablet TAKE 2 TABLETS BY MOUTH 2 TIMES DAILY. 09/09/20  Yes [name], MD  vitamin B-12 (CYANOCOBALAMIN) 500 MCG tablet Take 2 tablets (1,000 mcg total) by mouth daily. 11/16/19  Yes Ria Bush, MD  warfarin (COUMADIN) 5 MG tablet TAKE  1/2 TO 1 TABLET BY MOUTH DAILY AS DIRECTED BY COUMADIN CLINIC 12/30/19  Yes Evans Lance, MD  polyethylene glycol [facility] Floria Raveling) packet Take 17 g by mouth daily as needed (constipation).  Patient not taking: No sig reported    [provider]  potassium chloride SA (KLOR-CON) 20 MEQ tablet Take 2 tablets (40 mEq total) by mouth once for 1 dose. Patient not taking: No sig reported 09/23/20 12/05/20  [name], MD    Physical Exam: Vitals:   12/05/20 1630 12/05/20 1700 12/05/20 1830 12/05/20 1900  BP: (!) 84/68 115/68 (!) 89/48 117/81  Pulse: 66 (!) 53 (!) 58 (!) 110  Resp: 20 17 (!) 46 18  Temp:    97.7 F (36.5 C)  TempSrc:      SpO2: 96% 96% 93% 99%  Weight:      Height:       General: Not in acute distress HEENT:       Eyes: PERRL, EOMI, no scleral icterus.       ENT: No discharge from the ears and nose, no pharynx injection, no tonsillar enlargement.        Neck: Positive JVD, no bruit, no mass felt. Heme: No neck lymph node enlargement. Cardiac: S1/S2, RRR, No murmurs, No gallops or rubs. Respiratory: Has fine crackles bilaterally GI: Soft, nondistended, nontender, no rebound pain, no organomegaly, BS present. GU: No hematuria Ext: has 2+ pitting leg edema bilaterally. 1+DP/PT pulse bilaterally. Musculoskeletal: No joint deformities, No joint redness or warmth, no limitation of ROM in spin. Skin: No rashes.  Neuro: Alert, oriented X3, cranial nerves II-XII grossly intact, moves all extremities normally.  Psych: Patient is not psychotic, no suicidal or hemocidal ideation.  Labs on Admission: I have personally reviewed following labs and imaging studies  CBC: Recent Labs  Lab 12/04/20 1956  WBC 8.8  HGB 12.9*   HCT 37.2*  MCV 89.0  PLT [id number]*   Basic Metabolic Panel: Recent Labs  Lab 12/04/20 1956 12/05/20 0838  NA 128* 128*  K 5.5* 4.3  CL 92* 94*  CO2 25 26  GLUCOSE 125* 108*  BUN 38* 43*  CREATININE 1.66* 1.72*  CALCIUM 9.1 8.8*   GFR: Estimated Creatinine Clearance: 25.3 mL/min (A) (by C-G formula based on SCr of 1.72 mg/dL (H)). Liver Function Tests: No results for input(s): AST, ALT, ALKPHOS, BILITOT, PROT, ALBUMIN in the last 168 hours. No results for input(s): LIPASE, AMYLASE in the last 168 hours. No results for input(s): AMMONIA in the last 168 hours. Coagulation Profile: Recent Labs  Lab 12/04/20 1956 12/05/20 1457  INR 2.1* 1.9*   Cardiac Enzymes: No results for input(s): CKTOTAL, CKMB, CKMBINDEX, TROPONINI in the last 168 hours. BNP (last 3 results) Recent Labs    09/01/20 1155  PROBNP 22,792*   HbA1C: No results for input(s): HGBA1C in the last 72 hours. CBG: No results for input(s): GLUCAP in the last 168 hours. Lipid Profile: No results for input(s): CHOL, HDL, LDLCALC, TRIG, CHOLHDL, LDLDIRECT in the last 72 hours. Thyroid Function Tests: No results for input(s): TSH, T4TOTAL, FREET4, T3FREE, THYROIDAB in the last 72 hours. Anemia Panel: No results for input(s): VITAMINB12, FOLATE, FERRITIN, TIBC, IRON, RETICCTPCT in the last 72 hours. Urine analysis:    Component Value Date/Time   COLORURINE ORANGE (A) 06/17/2020 1646   APPEARANCEUR HAZY (A) 06/17/2020 1646   APPEARANCEUR Clear 07/06/2019 1046   LABSPEC 1.015 06/17/2020 1646   LABSPEC 1.010 08/20/2013 1220   PHURINE  06/17/2020  1646    TEST NOT REPORTED DUE TO COLOR INTERFERENCE OF URINE PIGMENT   GLUCOSEU (A) 06/17/2020 1646    TEST NOT REPORTED DUE TO COLOR INTERFERENCE OF URINE PIGMENT   GLUCOSEU Negative 08/20/2013 1220   HGBUR (A) 06/17/2020 1646    TEST NOT REPORTED DUE TO COLOR INTERFERENCE OF URINE PIGMENT   BILIRUBINUR (A) 06/17/2020 1646    TEST NOT REPORTED DUE TO COLOR  INTERFERENCE OF URINE PIGMENT   BILIRUBINUR Negative 07/06/2019 1046   BILIRUBINUR Negative 08/20/2013 1220   KETONESUR (A) 06/17/2020 1646    TEST NOT REPORTED DUE TO COLOR INTERFERENCE OF URINE PIGMENT   PROTEINUR (A) 06/17/2020 1646    TEST NOT REPORTED DUE TO COLOR INTERFERENCE OF URINE PIGMENT   UROBILINOGEN 0.2 08/17/2013 1019   NITRITE (A) 06/17/2020 1646    TEST NOT REPORTED DUE TO COLOR INTERFERENCE OF URINE PIGMENT   LEUKOCYTESUR (A) 06/17/2020 1646    TEST NOT REPORTED DUE TO COLOR INTERFERENCE OF URINE PIGMENT   LEUKOCYTESUR Negative 08/20/2013 1220   Sepsis Labs: '@LABRCNTIP'$ (procalcitonin:4,lacticidven:4) ) Recent Results (from the past 240 hour(s))  Resp Panel by RT-PCR (Flu A&B, Covid) Nasopharyngeal Swab     Status: None   Collection Time: 12/05/20  4:44 AM   Specimen: Nasopharyngeal Swab; Nasopharyngeal(NP) swabs in vial transport medium  Result Value Ref Range Status   SARS Coronavirus 2 by RT PCR NEGATIVE NEGATIVE Final    Comment: (NOTE) SARS-CoV-2 target nucleic acids are NOT DETECTED.  The SARS-CoV-2 RNA is generally detectable in upper respiratory specimens during the acute phase of infection. The lowest concentration of SARS-CoV-2 viral copies this assay can detect is 138 copies/mL. A negative result does not preclude SARS-Cov-2 infection and should not be used as the sole basis for treatment or other patient management decisions. A negative result may occur with  improper specimen collection/handling, submission of specimen other than nasopharyngeal swab, presence of viral mutation(s) within the areas targeted by this assay, and inadequate number of viral copies(<138 copies/mL). A negative result must be combined with clinical observations, patient history, and epidemiological information. The expected result is Negative.  Fact Sheet for Patients:  EntrepreneurPulse.com.au  Fact Sheet for Healthcare Providers:   IncredibleEmployment.be  This test is no t yet approved or cleared by the [place] FDA and  has been authorized for detection and/or diagnosis of SARS-CoV-2 by FDA under an Emergency Use Authorization (EUA). This EUA will remain  in effect (meaning this test can be used) for the duration of the COVID-19 declaration under Section 564(b)(1) of the Act, 21 U.S.C.section 360bbb-3(b)(1), unless the authorization is terminated  or revoked sooner.       Influenza A by PCR NEGATIVE NEGATIVE Final   Influenza B by PCR NEGATIVE NEGATIVE Final    Comment: (NOTE) The Xpert Xpress SARS-CoV-2/FLU/RSV plus assay is intended as an aid in the diagnosis of influenza from Nasopharyngeal swab specimens and should not be used as a sole basis for treatment. Nasal washings and aspirates are unacceptable for Xpert Xpress SARS-CoV-2/FLU/RSV testing.  Fact Sheet for Patients: EntrepreneurPulse.com.au  Fact Sheet for Healthcare Providers: IncredibleEmployment.be  This test is not yet approved or cleared by the [place] FDA and has been authorized for detection and/or diagnosis of SARS-CoV-2 by FDA under an Emergency Use Authorization (EUA). This EUA will remain in effect (meaning this test can be used) for the duration of the COVID-19 declaration under Section 564(b)(1) of the Act, 21 U.S.C. section 360bbb-3(b)(1), unless the authorization is terminated  or revoked.  Performed at [facility], [address] [place], [place] [address]      Radiological Exams on Admission: DG Chest 2 View  Result Date: 12/04/2020 CLINICAL DATA:  Shortness of breath. EXAM: CHEST - 2 VIEW COMPARISON:  June 17, 2020 FINDINGS: There is a multi lead AICD. Stable, diffuse, chronic appearing increased interstitial lung markings are seen. Mild areas of atelectasis are noted within the bilateral lung bases. Small bilateral pleural effusions are seen.  There is no evidence of a pneumothorax. There is moderate to marked severity cardiac silhouette enlargement. A radiopaque right shoulder replacement is present. IMPRESSION: 1. Stable cardiomegaly with mild bibasilar atelectasis. 2. Small bilateral pleural effusions. Electronically Signed   By: Virgina Norfolk M.D.   On: 12/04/2020 20:26     EKG: I have personally reviewed.  Paced rhythm, QTC 456, low voltage, left bundle blockage, poor wave progression   Assessment/Plan Principal Problem:   Acute on chronic systolic CHF (congestive heart failure) (HCC) Active Problems:   Anxiety   BPH (benign prostatic hyperplasia)   Thrombocytopenia (HCC)   Chronic atrial fibrillation (HCC)   HTN (hypertension)   Asthma   Gout   Acute on chronic respiratory failure with hypoxia (HCC)   CKD (chronic kidney disease), stage IIIa   Hyponatremia   Hyperkalemia   Sinus infection  Acute on chronic respiratory failure with hypoxia due to acute on chronic systolic CHF (congestive heart failure) ([place]): 2D echo on 06/18/2020 showed EF 20-25%.  He has elevated BNP 2501, positive JVD, 2+ leg edema, clinically consistent with CHF exacerbation.  -Will admit to progressive unit as inpatient -Lasix 80 mg bid by IV -2d echo -Daily weights -strict I/O's -will not do Low salt diet due to hyponatremia -Fluid restriction -Obtain REDs Vest reading  Anxiety: -Xanax  BPH (benign prostatic hyperplasia) -Hold Flomax due to softer blood pressure  Thrombocytopenia ([place]): This is chronic issue.  Platelet 110, no bleeding tendency -Follow-up with CBC  Chronic atrial fibrillation ([place]) -Hold Coreg due to soft blood pressure -Continue Coumadin  HTN (hypertension) -IV hydralazine as needed -Hold Coreg due to softer blood pressure  Asthma -Bronchodilators -Continue home prednisone 5 mg twice daily -Give Solu-Cortef 50 mg as stress dose  Gout -Allopurinol  CKD (chronic kidney disease), stage IIIa: Slightly  worsening.  Baseline creatinine 1.2-1.3.  His creatinine is 1.666, BUN 38.  May be due to cardiorenal syndrome -Monitor renal function by BMP  Hyponatremia: Sodium 128, the repeated sodium is still at 128 after giving IV Lasix -Monitor sodium level by BMP  Hyperkalemia: K= 5.5 --> 4.3 after giving lasix -f/u with BMP  Sinus infection -continue Cefdinir     DVT ppx: on Coumadin Code Status: DNR per pt and his wife Family Communication:  Yes, patient's  wife  at bed side Disposition Plan:  Anticipate discharge back to previous environment Consults called:  none Admission status and Level of care: Progressive Cardiac:     progressive unit as inpt      Status is: Inpatient  Remains inpatient appropriate because:Inpatient level of care appropriate due to severity of illness  Dispo: The patient is from: Home              Anticipated d/c is to: Home              Patient currently is not medically stable to d/c.   Difficult to place patient No          Date of Service 12/05/2020  Ivor Costa [facility]   If 7PM-7AM, please contact night-coverage [email or web address] 12/05/2020, 7:04 PM

## 2020-12-05 NOTE — Consult Note (Signed)
   Heart Failure Nurse Navigator Note  HFrEF 20 to 25%.  Normal right ventricular systolic function.  Severe left atrial enlargement.  Moderate right atrial enlargement.  Mild mitral regurgitation.  Mild to moderate tricuspid regurgitation.  He presented to the emergency room with complaints of worsening shortness of breath, productive cough of yellow sputum, lower extremity edema, abdominal girth increase decreased appetite and decreased urination.  Comorbidities:  Arthritis Anxiety Hypertension Left bundle branch block  Atrial flutter COPD  History of insertion of Medtronic BiV ICD.  Medication:  Furosemide 80 mg IV every 12 Warfarin dosing per the pharmacy  At home he is on torsemide 20 mg 2 times a day, carvedilol 6.25 mg - 1-1/2 tablets twice a day  Labs:  Sodium 128, potassium 4.3, chloride 94, CO2 26, BUN 43, creatinine 1.72, troponin 4, BNP 2501   Assessment:  General-he is awake and alert lying on a gurney in the emergency room.  HEENT, wears glasses, non icteric  Cardiac-regular rate and rhythm.  Chest-diminished in the bases no wheezing or rhonchi noted.  Abdomen-rounded, non tender  Musculoskeletal-feet with 3+ pitting edema in legs with 2+ pitting edema  Psych-is pleasant and appropriate makes good eye contact.  Neurologic-speech is clear moves all extremities without difficulty.  Met with patient and wife in the emergency room.  He appears in no acute distress.  States that he does not feel he is urinating very much.  At home he had noticed his weight slowly creeping up, his abdomen getting larger and decrease in appetite along with lower extremity edema.  Wife wanted to call someone on Friday but patient had told her no he wanted to wait until Monday.  He states that he did not feel that they had been any dietary indiscretions and he been sticking with his fluid restriction.    Discussed calling the clinic when he notices that he is gaining weight,  feels that something is not right or there are symptoms of increasing shortness of breath etc.  Will continue to follow.  Has an appointment with Otila Kluver in the [facility] on September 8 at [place]

## 2020-12-06 ENCOUNTER — Inpatient Hospital Stay (HOSPITAL_COMMUNITY)
Admit: 2020-12-06 | Discharge: 2020-12-06 | Disposition: A | Discharge status: Home / Self Care | Payer: Medicare HMO | Attending: Internal Medicine | Admitting: Internal Medicine

## 2020-12-06 DIAGNOSIS — I5021 Acute systolic (congestive) heart failure: Secondary | ICD-10-CM

## 2020-12-06 LAB — BASIC METABOLIC PANEL
Anion gap: 6 (ref 5–15)
BUN: 37 mg/dL — ABNORMAL HIGH (ref 8–23)
CO2: 26 mmol/L (ref 22–32)
Calcium: 8.3 mg/dL — ABNORMAL LOW (ref 8.9–10.3)
Chloride: 99 mmol/L (ref 98–111)
Creatinine, Ser: 1.43 mg/dL — ABNORMAL HIGH (ref 0.61–1.24)
GFR, Estimated: 47 mL/min — ABNORMAL LOW (ref >60)
Glucose, Bld: 94 mg/dL (ref 70–99)
Potassium: 3.4 mmol/L — ABNORMAL LOW (ref 3.5–5.1)
Sodium: 131 mmol/L — ABNORMAL LOW (ref 135–145)

## 2020-12-06 LAB — ECHOCARDIOGRAM COMPLETE
AR max vel: 2.7 cm2
AV Area VTI: 3.06 cm2
AV Area mean vel: 2.62 cm2
AV Mean grad: 2 mmHg
AV Peak grad: 3.1 mmHg
Ao pk vel: 0.88 m/s
Area-P 1/2: 4.49 cm2
Calc EF: 29.2 %
S' Lateral: 4.56 cm
Single Plane A2C EF: 23.3 %
Single Plane A4C EF: 33.9 %
Weight: 2249.61 oz

## 2020-12-06 LAB — PROTIME-INR
INR: 1.7 — ABNORMAL HIGH (ref 0.8–1.2)
Prothrombin Time: 19.6 seconds — ABNORMAL HIGH (ref 11.4–15.2)

## 2020-12-06 LAB — MAGNESIUM: Magnesium: 2.3 mg/dL (ref 1.7–2.4)

## 2020-12-06 MED ORDER — WARFARIN SODIUM 5 MG PO TABS
5.0000 mg | ORAL_TABLET | Freq: Once | ORAL | Status: AC
  Administered 2020-12-06: 5 mg via ORAL
  Filled 2020-12-06 (×2): qty 1

## 2020-12-06 MED ORDER — CARVEDILOL 3.125 MG PO TABS
3.1250 mg | ORAL_TABLET | Freq: Two times a day (BID) | ORAL | Status: DC
  Administered 2020-12-06 – 2020-12-10 (×8): 3.125 mg via ORAL
  Filled 2020-12-06 (×8): qty 1

## 2020-12-06 MED ORDER — CEFDINIR 300 MG PO CAPS
300.0000 mg | ORAL_CAPSULE | Freq: Two times a day (BID) | ORAL | Status: DC
  Administered 2020-12-06 – 2020-12-07 (×3): 300 mg via ORAL
  Filled 2020-12-06 (×3): qty 1

## 2020-12-06 NOTE — Progress Notes (Signed)
   Heart Failure Nurse Navigator Note  Met with patient today, he is lying in bed no acute distress.  Family members are at the bedside.  Exam his lower extremity edema has much improved ,still is pitting at 1+.  Denies any shortness of breath.  He states that his urine output has picked up.  Discussed his follow-up appointment with Ms. Hackney in the outpatient heart failure clinic on September 8 at [place]

## 2020-12-06 NOTE — Progress Notes (Signed)
*  PRELIMINARY RESULTS* Echocardiogram 2D Echocardiogram has been performed.  [name] 12/06/2020, 8:47 AM

## 2020-12-06 NOTE — Progress Notes (Addendum)
Received a call from telemetry reporting patient had a 6 beat run of VT@ approx 2245.  Assessed patient, who was resting in bed; coincidentally RT had entered room to begin sleep study right after run of VT.  Patient asymptomatic; denies any pain, complaints, or SOB.   Vitals:  T = 98.2 oral BP 101/58 (MAP 73) RR = 18 SPO2 = 96% on RA HR = 64  Notified B. Randol Kern via secure message; who acknowledged.  No new orders at this time.  Will continue to monitor

## 2020-12-06 NOTE — Consult Note (Signed)
ANTICOAGULATION CONSULT NOTE - Initial Consult  Pharmacy Consult for warfarin Indication: atrial flutter   Allergies  Allergen Reactions   Carafate [Sucralfate] Other (See Comments)    Burns stomach   Clarithromycin Nausea Only   Famotidine Other (See Comments)    ABD. PAIN   Levaquin [Levofloxacin] Other (See Comments)    Stomach pain, has to eat a lot of food   Omeprazole Diarrhea   Oxytetracycline Other (See Comments)    BUMPS   Penicillins Swelling    Amoxicillin ok, Pt states he had to have a shot to reverse the reaction Has patient had a PCN reaction causing immediate rash, facial/tongue/throat swelling, SOB or lightheadedness with hypotension: Yes Has patient had a PCN reaction causing severe rash involving mucus membranes or skin necrosis: No Has patient had a PCN reaction that required hospitalization No Has patient had a PCN reaction occurring within the last 10 years: Yes If all of the above answers are "NO", then may proceed with   Proton Pump Inhibitors Other (See Comments)    GI upset, diarrhea, gas, bloating   Ranitidine Diarrhea   Doxycycline Rash    Patient Measurements: Height: '5\' 4"'$  (162.6 cm) Weight: 63.8 kg (140 lb 9.6 oz) IBW/kg (Calculated) : 59.2  Vital Signs: Temp: 97.7 F (36.5 C) (08/30 0514) Temp Source: Oral (08/30 0514) BP: 105/64 (08/30 0514) Pulse Rate: 70 (08/30 0514)  Labs: Recent Labs    12/04/20 1956 12/04/20 2242 12/05/20 0838 12/05/20 1457 12/06/20 0401  HGB 12.9*  --   --   --   --   HCT 37.2*  --   --   --   --   PLT 110*  --   --   --   --   LABPROT 23.7*  --   --  22.0* 19.6*  INR 2.1*  --   --  1.9* 1.7*  CREATININE 1.66*  --  1.72*  --  1.43*  TROPONINIHS 4 4  --   --   --      Estimated Creatinine Clearance: 30.5 mL/min (A) (by C-G formula based on SCr of 1.43 mg/dL (H)).   Medical History: Past Medical History:  Diagnosis Date   AICD (automatic cardioverter/defibrillator) present    s/p gen change 04/2015  w/ MDT [name] Crt-D  DTBA1D1, Serial number [id number]   Allergic rhinitis    [name]   Anemia    Anxiety    Arthritis    Atrial flutter ([place])    A.  Status post cardioversion; B.  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 Lucky Cowboy)   Chronic lung disease    spirometry 2015 - no obstruction, + mild restrictive lung disease   Chronic sinusitis    Chronic systolic congestive heart failure ([place])    Dyspnea    with exertion   Extrinsic asthma    [name]   Fall with injury 05/28/2017   GERD (gastroesophageal reflux disease) 2010   h/o esophageal stricture with dilation, LA grade C reflux esophagitis by EGD 2010   Goiter    History of diverticulitis of colon 06/2020   History of GI bleed    Secondary to hemorrhoids   History of seasonal allergies    Hypertension    IBS (irritable bowel syndrome)    LBBB (left bundle  branch block)    Multiple pulmonary nodules 06/2013   RUL ([facility] Adam [facility]ix [facility]) - ?vasculitis as of last CT at [facility]   NICM (nonischemic cardiomyopathy) [facility])    Cardiac catheterization March 2006 without coronary disease; EF 25% 2018 Jan   Orthopnea    [facility])    Presence of permanent cardiac pacemaker    Sinus congestion 09/25/2010    Medications:  Warfarin PTA: 5 mg Monday, Friday 2.5 mg all other days TWD = 22.5 mg   Assessment: 87 y.o. male with PMH of atrial flutter on chronic warfarin therapy of outpatient regimen outline above. Presented with SOB with associated leg swelling. INR 2.1 (therapeutic) on admission.  DD Interactions: Patient started on 10 day course of cefdinir 300 mg BID 12/03/20 PTA prednisone (10 mg daily) temporarily increased to 10 mg BID x5 days on 12/03/20 for sinusitis   Date INR Warfarin Dose  8/27 -- 2.5 mg (PTA) 8/28 2.1 No dose given 8/29 1.9 5  mg 8/30 1.7 5 mg  Goal of Therapy:  INR 2-3 (Per pt Pulmonologist wants INR around 2.0) Monitor platelets by anticoagulation protocol: Yes   Plan:  INR 1.7 (subtherapeutic) following missing dose 8/28 Will give 5 mg x 1 today, but back to regular schedule tomorrow Anticipate INR to possibly still be low tomorrow due to missed Sunday dose INR daily    Lu Duffel, PharmD, BCPS Clinical Pharmacist   12/06/2020,7:16 AM

## 2020-12-06 NOTE — Progress Notes (Signed)
PHARMACY NOTE:  ANTIMICROBIAL RENAL DOSAGE ADJUSTMENT  Current antimicrobial regimen includes a mismatch between antimicrobial dosage and estimated renal function.  As per policy approved by the Pharmacy & Therapeutics and Medical Executive Committees, the antimicrobial dosage will be adjusted accordingly.  Current antimicrobial dosage:  Cefdinir 300 mg q24h  Indication: sinus infection  Renal Function:  Estimated Creatinine Clearance: 30.5 mL/min (A) (by C-G formula based on SCr of 1.43 mg/dL (H)).    Antimicrobial dosage has been changed to:  Cefdinir 300 mg q12h  Additional comments:   Thank you for allowing pharmacy to be a part of this patient's care.  Lu Duffel, PharmD, BCPS Clinical Pharmacist 12/06/2020 7:15 AM

## 2020-12-06 NOTE — Progress Notes (Signed)
PT Cancellation Note  Patient Details Name: [name] MRN: [id number] DOB: 01-Feb-1932   Cancelled Treatment:    Reason Eval/Treat Not Completed: Other (comment). COnsult received and chart reviewed.  Upon arrival, pt very adamant that he does not want PT services. He reports he is indep at baseline and has had HHPT previously. Family in room and agreeable to pt request. Will complete current order at this time. Educated pt that if he changes mind and wants PT, please re-consult.   Khiem Gargis 12/06/2020, 10:30 AM Greggory Stallion, PT, DPT [phone]

## 2020-12-06 NOTE — Progress Notes (Signed)
[place] [facility] [place] NAME: [name]    MR#:  [id number]  DATE OF BIRTH:  January 06, 1932  SUBJECTIVE:  came in and increasing sob and leg swelling. Daughter and wife in the room.  Patient lately has been noncompliant with his diet eating salty food per family  shortness of breath improving. Does not normally wear oxygen at home. Able to lay almost flat in the bed  REVIEW OF SYSTEMS:   Review of Systems  Constitutional:  Negative for chills, fever and weight loss.  HENT:  Negative for ear discharge, ear pain and nosebleeds.   Eyes:  Negative for blurred vision, pain and discharge.  Respiratory:  Positive for shortness of breath. Negative for sputum production, wheezing and stridor.   Cardiovascular:  Positive for leg swelling. Negative for chest pain, palpitations, orthopnea and PND.  Gastrointestinal:  Negative for abdominal pain, diarrhea, nausea and vomiting.  Genitourinary:  Negative for frequency and urgency.  Musculoskeletal:  Negative for back pain and joint pain.  Neurological:  Negative for sensory change, speech change, focal weakness and weakness.  Psychiatric/Behavioral:  Negative for depression and hallucinations. The patient is not nervous/anxious.   Tolerating Diet:yes Tolerating PT: pt says he is independent  DRUG ALLERGIES:   Allergies  Allergen Reactions   Carafate [Sucralfate] Other (See Comments)    Burns stomach   Clarithromycin Nausea Only   Famotidine Other (See Comments)    ABD. PAIN   Levaquin [Levofloxacin] Other (See Comments)    Stomach pain, has to eat a lot of food   Omeprazole Diarrhea   Oxytetracycline Other (See Comments)    BUMPS   Penicillins Swelling    Amoxicillin ok, Pt states he had to have a shot to reverse the reaction Has patient had a PCN reaction causing immediate rash, facial/tongue/throat swelling, SOB or lightheadedness with hypotension: Yes Has patient had a PCN reaction  causing severe rash involving mucus membranes or skin necrosis: No Has patient had a PCN reaction that required hospitalization No Has patient had a PCN reaction occurring within the last 10 years: Yes If all of the above answers are "NO", then may proceed with   Proton Pump Inhibitors Other (See Comments)    GI upset, diarrhea, gas, bloating   Ranitidine Diarrhea   Doxycycline Rash    VITALS:  Blood pressure 98/62, pulse 61, temperature 98 F (36.7 C), temperature source Oral, resp. rate 20, height '5\' 4"'$  (1.626 m), weight 63.8 kg, SpO2 98 %.  PHYSICAL EXAMINATION:   Physical Exam  GENERAL:  87 y.o.-year-old patient lying in the bed with no acute distress. Weak, deconditioned LUNGS: decreased breath sounds bilaterally, no wheezing, rales, rhonchi. No use of accessory muscles of respiration.  CARDIOVASCULAR: S1, S2 normal. No murmurs, rubs, or gallops.  ABDOMEN: Soft, nontender, nondistended. Bowel sounds present. No organomegaly or mass.  EXTREMITIES: chronic leg edema b/l.    NEUROLOGIC: Cranial nerves II through XII are intact. No focal Motor or sensory deficits b/l.   PSYCHIATRIC:  patient is alert and oriented x 3.  SKIN: No obvious rash, lesion, or ulcer.   LABORATORY PANEL:  CBC Recent Labs  Lab 12/04/20 1956  WBC 8.8  HGB 12.9*  HCT 37.2*  PLT 110*    Chemistries  Recent Labs  Lab 12/06/20 0401  NA 131*  K 3.4*  CL 99  CO2 26  GLUCOSE 94  BUN 37*  CREATININE 1.43*  CALCIUM 8.3*  MG 2.3  Cardiac Enzymes No results for input(s): TROPONINI in the last 168 hours. RADIOLOGY:  DG Chest 2 View  Result Date: 12/04/2020 CLINICAL DATA:  Shortness of breath. EXAM: CHEST - 2 VIEW COMPARISON:  June 17, 2020 FINDINGS: There is a multi lead AICD. Stable, diffuse, chronic appearing increased interstitial lung markings are seen. Mild areas of atelectasis are noted within the bilateral lung bases. Small bilateral pleural effusions are seen. There is no evidence of a  pneumothorax. There is moderate to marked severity cardiac silhouette enlargement. A radiopaque right shoulder replacement is present. IMPRESSION: 1. Stable cardiomegaly with mild bibasilar atelectasis. 2. Small bilateral pleural effusions. Electronically Signed   By: Virgina Norfolk M.D.   On: 12/04/2020 20:26   ECHOCARDIOGRAM COMPLETE  Result Date: 12/06/2020    ECHOCARDIOGRAM REPORT   Patient Name:   [name] Date of Exam: 12/06/2020 Medical Rec #:  [id number]             Height:       64.0 in Accession #:    [id number]            Weight:       140.6 lb Date of Birth:  Sep 12, 1931             BSA:          1.684 m Patient Age:    87 years              BP:           105/64 mmHg Patient Gender: M                     HR:           70 bpm. Exam Location:  [facility] Procedure: 2D Echo, Cardiac Doppler, Color Doppler and Strain Analysis Indications:     CHF-acute systolic [id number]  History:         Patient has prior history of Echocardiogram examinations, most                  recent 06/18/2020. AICD, ischemic cardiomyopathy.  Sonographer:     Sherrie Sport Referring Phys:  [id number] Ivor Costa Diagnosing Phys: Nelva Bush MD  Sonographer Comments: Global longitudinal strain was attempted. IMPRESSIONS  1. Left ventricular ejection fraction, by estimation, is 20 to 25%. The left ventricle has severely decreased function. The left ventricle demonstrates global hypokinesis. Left ventricular diastolic parameters are indeterminate. The average left ventricular global longitudinal strain is -9.0 %. The global longitudinal strain is abnormal.  2. Right ventricular systolic function is moderately reduced. The right ventricular size is normal. There is mildly elevated pulmonary artery systolic pressure.  3. Left atrial size was severely dilated.  4. Right atrial size was moderately dilated.  5. The mitral valve is abnormal. Mild mitral valve regurgitation.  6. The aortic valve was not well visualized. Aortic valve  regurgitation is mild. No aortic stenosis is present.  7. The inferior vena cava is normal in size with <50% respiratory variability, suggesting right atrial pressure of 8 mmHg. FINDINGS  Left Ventricle: Left ventricular size and wall thickness not well-assessed. Left ventricular ejection fraction, by estimation, is 20 to 25%. The left ventricle has severely decreased function. The left ventricle demonstrates global hypokinesis. The average left ventricular global longitudinal strain is -9.0 %. The global longitudinal strain is abnormal. Left ventricular diastolic parameters are indeterminate. Right Ventricle: The right ventricular size is normal. Right vetricular wall thickness was not well visualized.  Right ventricular systolic function is moderately reduced. There is mildly elevated pulmonary artery systolic pressure. The tricuspid regurgitant velocity is 3.03 m/s, and with an assumed right atrial pressure of 8 mmHg, the estimated right ventricular systolic pressure is 123456 mmHg. Left Atrium: Left atrial size was severely dilated. Right Atrium: Right atrial size was moderately dilated. Pericardium: There is no evidence of pericardial effusion. Mitral Valve: The mitral valve is abnormal. There is mild thickening of the mitral valve leaflet(s). Mild mitral valve regurgitation. Tricuspid Valve: The tricuspid valve is not well visualized. Tricuspid valve regurgitation is trivial. Aortic Valve: The aortic valve was not well visualized. Aortic valve regurgitation is mild. No aortic stenosis is present. Aortic valve mean gradient measures 2.0 mmHg. Aortic valve peak gradient measures 3.1 mmHg. Aortic valve area, by VTI measures 3.06  cm. Pulmonic Valve: The pulmonic valve was not well visualized. Aorta: The aortic root was not well visualized. Venous: The inferior vena cava is normal in size with less than 50% respiratory variability, suggesting right atrial pressure of 8 mmHg. IAS/Shunts: The interatrial septum was  not well visualized. Additional Comments: A device lead is visualized in the right atrium and right ventricle.  LEFT VENTRICLE PLAX 2D LVIDd:         5.05 cm LVIDs:         4.56 cm      2D Longitudinal Strain LV PW:         0.96 cm      2D Strain GLS Avg:     -9.0 % LV IVS:        0.72 cm LVOT diam:     2.00 cm LV SV:         48 LV SV Index:   28           3D Volume EF: LVOT Area:     3.14 cm     3D EF:        42 %                             LV EDV:       166 ml                             LV ESV:       96 ml LV Volumes (MOD)            LV SV:        70 ml LV vol d, MOD A2C: 133.0 ml LV vol d, MOD A4C: 110.0 ml LV vol s, MOD A2C: 102.0 ml LV vol s, MOD A4C: 72.7 ml LV SV MOD A2C:     31.0 ml LV SV MOD A4C:     110.0 ml LV SV MOD BP:      37.0 ml RIGHT VENTRICLE RV Basal diam:  3.25 cm RV S prime:     7.07 cm/s TAPSE (M-mode): 1.2 cm LEFT ATRIUM              Index       RIGHT ATRIUM           Index LA diam:        4.90 cm  2.91 cm/m  RA Area:     28.50 cm LA Vol (A2C):   96.9 ml  57.53 ml/m RA Volume:   91.40 ml  54.27 ml/m LA Vol (A4C):  113.0 ml 67.09 ml/m LA Biplane Vol: 108.0 ml 64.12 ml/m  AORTIC VALVE AV Area (Vmax):    2.70 cm AV Area (Vmean):   2.62 cm AV Area (VTI):     3.06 cm AV Vmax:           87.50 cm/s AV Vmean:          62.400 cm/s AV VTI:            0.156 m AV Peak Grad:      3.1 mmHg AV Mean Grad:      2.0 mmHg LVOT Vmax:         75.10 cm/s LVOT Vmean:        52.000 cm/s LVOT VTI:          0.152 m LVOT/AV VTI ratio: 0.97 MITRAL VALVE               TRICUSPID VALVE MV Area (PHT): 4.49 cm    TR Peak grad:   36.7 mmHg MV Decel Time: 169 msec    TR Vmax:        303.00 cm/s MV E velocity: 85.30 cm/s                            SHUNTS                            Systemic VTI:  0.15 m                            Systemic Diam: 2.00 cm Nelva Bush MD Electronically signed by Nelva Bush MD Signature Date/Time: 12/06/2020/11:45:03 AM    Final    ASSESSMENT AND PLAN:  [name] is  a 87 y.o. male with medical history significant of sCHF with EF of 20-25%, hypertension, asthma, GERD, gout, anxiety, AICD placement, left bundle blockage, porphyruria, IBS, GI bleeding, diverticulitis, BPH, atrial fibrillation on Coumadin, anemia, aortic valve regurgitation, esophageal stricture, pulmonary hypertension, thrombocytopenia, CKD-3A, sinus infection, who presents with shortness breath and weight gain.   Acute on chronic respiratory failure with hypoxia due to acute on chronic systolic CHF (congestive heart failure) ([place]):  --2D echo on 12/06/2020 showed EF 20-25%.  He has elevated BNP 2501, positive JVD, 2+ leg edema, clinically consistent with CHF exacerbation. -Lasix 80 mg bid by IV (takes torsemide at home) -Daily weights -strict I/O's -Fluid restriction --decreased Coreg to 3.125 mg bid due to relative low blood pressure and patient getting high dose of Lasix. Adjust dosing according to patient's clinical symptoms -- consider cardiology consultation with [facility] if needed.  --patient improving --Overnite pulse oximetry tonite -- patient is not use oxygen at home. -- Patient follows at the [facility] heart failure clinic   Anxiety: -Xanax   BPH (benign prostatic hyperplasia) -hold Flomax due to softer blood pressure (resume at d/c)   Thrombocytopenia ([place]): This is chronic issue.  Platelet 110, no bleeding tendency --stable   Chronic atrial fibrillation ([facility]) --coreg decreased as above -Continue Coumadin   Asthma -Bronchodilators -Continue home prednisone 5 mg twice daily -Give Solu-Cortef 50 mg as stress dose   Gout -Allopurinol   CKD (chronic kidney disease), stage IIIa: Slightly worsening.  Baseline creatinine 1.2-1.3.  His creatinine is 1.66, BUN 38.  May be due to cardiorenal syndrome --creat 1.66--1.42   Hyponatremia: Sodium 128--132 --cont IV lasix  Hyperkalemia: K=  5.5 --> 4.3 after giving lasix -f/u with BMP   Sinus infection (ongoing treatment as  outpatient) -continue Cefdinir         DVT ppx: on Coumadin Code Status: DNR per pt and his wife Family Communication:  Yes, patient's  wife and dter at bed side Disposition Plan:  Anticipate discharge back to previous environment Consults called:  none Admission status and Level of care: Progressive Cardiac:     progressive unit as inpt        Status is: Inpatient   Remains inpatient appropriate because:Inpatient level of care appropriate due to severity of illness   Dispo: The patient is from: Home              Anticipated d/c is to: Home 1-3 days              Patient currently is not medically stable to d/c.              Difficult to place patient No             TOTAL TIME TAKING CARE OF THIS PATIENT: 30 minutes.  >50% time spent on counselling and coordination of care  Note: This dictation was prepared with Dragon dictation along with smaller phrase technology. Any transcriptional errors that result from this process are unintentional.  Fritzi Mandes M.D    [facility]   CC: Primary care physician; Ria Bush, MD Patient ID: Sherryle Lis, male   DOB: Jul 26, 1931, 87 y.o.   MRN: [id number]

## 2020-12-06 NOTE — Evaluation (Signed)
Occupational Therapy Evaluation Patient Details Name: [name] MRN: [id number] DOB: 1931/10/29 Today's Date: 12/06/2020    History of Present Illness [name] is a 87 y.o. male with medical history significant of sCHF with EF of 20-25%, hypertension, asthma, GERD, gout, anxiety, AICD placement, left bundle blockage, porphyruria, IBS, GI bleeding, diverticulitis, BPH, atrial fibrillation on Coumadin, anemia, aortic valve regurgitation, esophageal stricture, pulmonary hypertension, thrombocytopenia, CKD-3A, sinus infection, who presents with progressively worsening shortness breath and 5+ lbs weight gain in the past 5 days.   Clinical Impression   [name] presents today with generalized weakness, limited endurance, and 2+ pitting edema in b/l LE. He lives in a level-entry home with his wife, with 3 children living nearby and visiting daily. He no longer drives but is active around the home and garden/greenhouse and ambulates w/o AD, is IND/ModI with fxl mobility/ADL/IADL. During today's evaluation, he denies pain and SOB (SpO2 98% on room air), is able to perform bed mobility, transfers, grooming, UB bathing, UB dressing, and self-feeding with ModI-SUPV. MinA for LB dressing. Pt and family are adamant that they do not want any type of home health post DC, with pt saying that he finds it "annoying" to have strangers coming into his home. Wife and daughter, both present in room throughout the session, agree that they can provide any assistance pt may need. Will sign off now, no additional OT needed.    Follow Up Recommendations  No OT follow up    Equipment Recommendations  None recommended by OT    Recommendations for Other Services       Precautions / Restrictions Precautions Precautions: Fall Precaution Comments: Mod fall risk Restrictions Weight Bearing Restrictions: No      Mobility Bed Mobility Overal bed mobility: Modified Independent                   Transfers Overall transfer level: Needs assistance   Transfers: Sit to/from Stand Sit to Stand: Min guard              Balance Overall balance assessment: Modified Independent                                         ADL either performed or assessed with clinical judgement   ADL Overall ADL's : Needs assistance/impaired Eating/Feeding: Independent   Grooming: Applying deodorant;Wash/dry face;Wash/dry hands;Supervision/safety;Set up   Upper Body Bathing: Set up;Supervision/ safety;Sitting       Upper Body Dressing : Supervision/safety Upper Body Dressing Details (indicate cue type and reason): changing shirt Lower Body Dressing: Minimal assistance                       Vision         Perception     Praxis      Pertinent Vitals/Pain Pain Assessment: No/denies pain     Hand Dominance Right   Extremity/Trunk Assessment Upper Extremity Assessment Upper Extremity Assessment: Overall WFL for tasks assessed   Lower Extremity Assessment Lower Extremity Assessment: Overall WFL for tasks assessed       Communication Communication Communication: No difficulties   Cognition Arousal/Alertness: Awake/alert Behavior During Therapy: WFL for tasks assessed/performed Overall Cognitive Status: Within Functional Limits for tasks assessed  General Comments: A&O x 4; grumpy   General Comments  b/l LE edema    Exercises Other Exercises Other Exercises: bed mobility, transfers, grooming, UB bathing, dressing, self-feeding. Educ re: role of OT, POC, DC recs   Shoulder Instructions      Home Living Family/patient expects to be discharged to:: Private residence Living Arrangements: Spouse/significant other Available Help at Discharge: Family;Available 24 hours/day Type of Home: House Home Access: Level entry     Home Layout: One level     Bathroom Shower/Tub: Tub/shower  unit;Curtain   Bathroom Toilet: Handicapped height     Home Equipment: [place] - single point;Walker - standard;Bedside commode;Shower seat          Prior Functioning/Environment Level of Independence: Independent        Comments: Does not use AD, IND with ADL (generally taking sponge baths rather than showering), does not drive        OT Problem List: Decreased activity tolerance;Increased edema;Decreased strength      OT Treatment/Interventions:      OT Goals(Current goals can be found in the care plan section) Acute Rehab OT Goals Patient Stated Goal: to go home OT Goal Formulation: With patient/family Time For Goal Achievement: 12/20/20 Potential to Achieve Goals: Good  OT Frequency:     Barriers to D/C:            Co-evaluation              AM-PAC OT "6 Clicks" Daily Activity     Outcome Measure Help from another person eating meals?: None Help from another person taking care of personal grooming?: None Help from another person toileting, which includes using toliet, bedpan, or urinal?: A Little Help from another person bathing (including washing, rinsing, drying)?: A Little Help from another person to put on and taking off regular upper body clothing?: A Little Help from another person to put on and taking off regular lower body clothing?: A Little 6 Click Score: 20   End of Session    Activity Tolerance: Patient tolerated treatment well Patient left: in bed;with call bell/phone within reach;with family/visitor present  OT Visit Diagnosis: Muscle weakness (generalized) (M62.81)                Time: [id number] OT Time Calculation (min): 16 min Charges:  OT General Charges $OT Visit: 1 Visit OT Evaluation $OT Eval Low Complexity: 1 Low OT Treatments $Self Care/Home Management : 8-22 mins Josiah Lobo, PhD, MS, OTR/L 12/06/20, 10:14 AM

## 2020-12-07 ENCOUNTER — Telehealth: Payer: Self-pay | Admitting: Cardiology

## 2020-12-07 ENCOUNTER — Encounter: Payer: Self-pay | Admitting: Internal Medicine

## 2020-12-07 DIAGNOSIS — I4821 Permanent atrial fibrillation: Secondary | ICD-10-CM

## 2020-12-07 DIAGNOSIS — I428 Other cardiomyopathies: Secondary | ICD-10-CM | POA: Diagnosis not present

## 2020-12-07 DIAGNOSIS — I5023 Acute on chronic systolic (congestive) heart failure: Secondary | ICD-10-CM | POA: Diagnosis not present

## 2020-12-07 LAB — BASIC METABOLIC PANEL
Anion gap: 9 (ref 5–15)
BUN: 36 mg/dL — ABNORMAL HIGH (ref 8–23)
CO2: 25 mmol/L (ref 22–32)
Calcium: 8.6 mg/dL — ABNORMAL LOW (ref 8.9–10.3)
Chloride: 97 mmol/L — ABNORMAL LOW (ref 98–111)
Creatinine, Ser: 1.54 mg/dL — ABNORMAL HIGH (ref 0.61–1.24)
GFR, Estimated: 43 mL/min — ABNORMAL LOW (ref >60)
Glucose, Bld: 120 mg/dL — ABNORMAL HIGH (ref 70–99)
Potassium: 4 mmol/L (ref 3.5–5.1)
Sodium: 131 mmol/L — ABNORMAL LOW (ref 135–145)

## 2020-12-07 LAB — PROTIME-INR
INR: 1.6 — ABNORMAL HIGH (ref 0.8–1.2)
Prothrombin Time: 19.3 seconds — ABNORMAL HIGH (ref 11.4–15.2)

## 2020-12-07 MED ORDER — METOLAZONE 2.5 MG PO TABS
2.5000 mg | ORAL_TABLET | Freq: Every day | ORAL | Status: DC
  Filled 2020-12-07: qty 1

## 2020-12-07 MED ORDER — WARFARIN SODIUM 5 MG PO TABS
5.0000 mg | ORAL_TABLET | Freq: Once | ORAL | Status: AC
  Administered 2020-12-07: 5 mg via ORAL
  Filled 2020-12-07: qty 1

## 2020-12-07 MED ORDER — METOLAZONE 2.5 MG PO TABS
2.5000 mg | ORAL_TABLET | Freq: Once | ORAL | Status: AC
  Administered 2020-12-07: 2.5 mg via ORAL
  Filled 2020-12-07: qty 1

## 2020-12-07 MED ORDER — CEFDINIR 300 MG PO CAPS: 300.0000 mg | ORAL_CAPSULE | Freq: Every day | ORAL | Status: DC

## 2020-12-07 NOTE — Progress Notes (Signed)
   Heart Failure Nurse Navigator Note   Met with the patient and his wife today who was at the bedside.  Discussed limiting fluid intake which includes milk, water, juice, tea, coffee sodas, anything that is poured.  Also made them aware that ice cream and Jell-O is considered a liquid because it melts in the mouth.  She states that they were not aware that tea and coffee were included.  Suggested getting 2 L bottle and filling each morning with water for the allotted amount and then every time that he drank something that amount should be removed from the bottle to help in keeping track of his fluid limit.   Also discussed sodium restriction.  He states that recently he had eaten at [employer], having breakfast that included ham.  Talked about asking the wait staff if they would happen to have an idea of what the sodium intake on the meal that they want to order.  Also gave them the new learning to live with heart failure teaching booklet and pointed out the section on restaurant eating.  Patient question following with his PCP and with Ms. Hackney in the heart failure clinic, stressed that following with both of these healthcare professionals would aid in keeping him out of the hospital with heart failure exacerbations.  Also stressed weighing daily, reporting two pound weight gain overnight or 5 pounds within in the week or increased swelling of abdomen ,legs are just not feeling right to contact Ms. Hackney.  They voiced understanding.  Patient's feet remain edematous and pitting, also pitting noted halfway up his calf.  Spoke with Dr. Mal Misty for order for TED stockings.  Spent greater than 40 minutes discussing the above with patient and wife.  At times the patient had to be redirected.  Pricilla Riffle RN [facility]

## 2020-12-07 NOTE — Telephone Encounter (Signed)
Son states his sister who is there with patient is texting all day that patient is not having.  Informed son . To contact patient 's nurse and have her to contact hospitalist - the family would like a cardiology consult for evaluation. Son is aware.

## 2020-12-07 NOTE — Consult Note (Signed)
[name] for warfarin Indication: atrial flutter   Allergies  Allergen Reactions   Carafate [Sucralfate] Other (See Comments)    Burns stomach   Clarithromycin Nausea Only   Famotidine Other (See Comments)    ABD. PAIN   Levaquin [Levofloxacin] Other (See Comments)    Stomach pain, has to eat a lot of food   Omeprazole Diarrhea   Oxytetracycline Other (See Comments)    BUMPS   Penicillins Swelling    Amoxicillin ok, Pt states he had to have a shot to reverse the reaction Has patient had a PCN reaction causing immediate rash, facial/tongue/throat swelling, SOB or lightheadedness with hypotension: Yes Has patient had a PCN reaction causing severe rash involving mucus membranes or skin necrosis: No Has patient had a PCN reaction that required hospitalization No Has patient had a PCN reaction occurring within the last 10 years: Yes If all of the above answers are "NO", then may proceed with   Proton Pump Inhibitors Other (See Comments)    GI upset, diarrhea, gas, bloating   Ranitidine Diarrhea   Doxycycline Rash    Patient Measurements: Height: '5\' 4"'$  (162.6 cm) Weight: 63.8 kg (140 lb 11.2 oz) IBW/kg (Calculated) : 59.2  Vital Signs: Temp: 97.9 F (36.6 C) (08/31 0712) BP: 114/67 (08/31 0712) Pulse Rate: 61 (08/31 0712)  Labs: Recent Labs    12/04/20 1956 12/04/20 2242 12/05/20 0838 12/05/20 1457 12/06/20 0401 12/07/20 0412  HGB 12.9*  --   --   --   --   --   HCT 37.2*  --   --   --   --   --   PLT 110*  --   --   --   --   --   LABPROT 23.7*  --   --  22.0* 19.6* 19.3*  INR 2.1*  --   --  1.9* 1.7* 1.6*  CREATININE 1.66*  --  1.72*  --  1.43* 1.54*  TROPONINIHS 4 4  --   --   --   --      Estimated Creatinine Clearance: 28.3 mL/min (A) (by C-G formula based on SCr of 1.54 mg/dL (H)).   Medical History: Past Medical History:  Diagnosis Date   AICD (automatic cardioverter/defibrillator) present    s/p gen change  04/2015 w/ MDT [name] Crt-D  DTBA1D1, Serial number [id number]   Allergic rhinitis    [name]   Anemia    Anxiety    Arthritis    Atrial flutter ([place])    A.  Status post cardioversion; B.  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 Lucky Cowboy)   Chronic lung disease    spirometry 2015 - no obstruction, + mild restrictive lung disease   Chronic sinusitis    Chronic systolic congestive heart failure ([place])    Dyspnea    with exertion   Extrinsic asthma    [name]   Fall with injury 05/28/2017   GERD (gastroesophageal reflux disease) 2010   h/o esophageal stricture with dilation, LA grade C reflux esophagitis by EGD 2010   Goiter    History of diverticulitis of colon 06/2020   History of GI bleed    Secondary to hemorrhoids   History of seasonal allergies    Hypertension    IBS (  irritable bowel syndrome)    LBBB (left bundle branch block)    Multiple pulmonary nodules 06/2013   RUL ([facility]. Adam Phenix [facility]) - ?vasculitis as of last CT [facility]   NICM (nonischemic cardiomyopathy) [facility])    Cardiac catheterization March 2006 without coronary disease; EF 25% 2018 Jan   Orthopnea    [facility])    Presence of permanent cardiac pacemaker    Sinus congestion 09/25/2010    Medications:  Warfarin PTA: 5 mg Monday, Friday 2.5 mg all other days TWD = 22.5 mg   Assessment: 87 y.o. male with PMH of atrial flutter on chronic warfarin therapy of outpatient regimen outline above. Presented with SOB with associated leg swelling. INR 2.1 (therapeutic) on admission.  DD Interactions: Patient started on 10 day course of cefdinir 300 mg BID 12/03/20 PTA prednisone (10 mg daily) temporarily increased to 10 mg BID x5 days on 12/03/20 for sinusitis   Date INR Warfarin Dose  8/27 -- 2.5 mg (PTA) 8/28 2.1 No dose  given 8/29 1.9 5 mg 8/30 1.7 5 mg 8/31 1.6  Goal of Therapy:  INR 2-3 (Per pt Pulmonologist wants INR around 2.0) Monitor platelets by anticoagulation protocol: Yes   Plan:  INR 1.6 (subtherapeutic) following missing dose 8/28 Will give 5 mg x 1 again today, but consider back to regular schedule tomorrow Anticipate INR to be therapeutic tomorrow INR daily    Lu Duffel, PharmD, BCPS Clinical Pharmacist   12/07/2020,8:25 AM

## 2020-12-07 NOTE — Consult Note (Signed)
Cardiology Consultation:   Patient ID: [name] MRN: [id number]; DOB: 1932/04/02  Admit date: 12/05/2020 Date of Consult: 12/07/2020  PCP:  Ria Bush, MD   [facility] Providers Cardiologist:  [name], MD  Electrophysiologist:  Cristopher Peru, MD       Patient Profile:   [name] is a 87 y.o. male with a hx of nonischemic cardiomyopathy, AICD who is being seen 12/07/2020 for the evaluation of shortness of breath at the request of Dr. Mal Misty  History of Present Illness:   [name] is an 87 year old gentleman with history of nonischemic cardiomyopathy, last EF 20 to 25%, s/p AICD, atrial flutter, GERD, pulmonary hypertension who presents with worsening shortness of breath and weight gain.  Patient states having worsening shortness of breath over the 7-day period.  As per wife, this was associated with weight gain of over 7 pounds.  He typically weighs around 132-135 pounds.  His weight at home was over 140 pounds.  This was associated with hypoxia, pulse oximetry having sats of 80s at home.  He was admitted to the hospital about 3 days ago.  He states not peeing and off over the past 24 hours, has had minimal urine output.  Has had some anxiety, improved with Zofran.  Since admission, he has been placed on Lasix IV .   Past Medical History:  Diagnosis Date   AICD (automatic cardioverter/defibrillator) present    s/p gen change 04/2015 w/ MDT [name] Crt-D  DTBA1D1, Serial number [id number]   Allergic rhinitis    [name]   Anemia    Anxiety    Arthritis    Atrial flutter ([place])    A.  Status post cardioversion; B.  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 Lucky Cowboy)   Chronic lung disease    spirometry 2015 - no obstruction, + mild restrictive lung disease   Chronic sinusitis     Chronic systolic congestive heart failure ([place])    Dyspnea    with exertion   Extrinsic asthma    [name]   Fall with injury 05/28/2017   GERD (gastroesophageal reflux disease) 2010   h/o esophageal stricture with dilation, LA grade C reflux esophagitis by EGD 2010   Goiter    History of diverticulitis of colon 06/2020   History of GI bleed    Secondary to hemorrhoids   History of seasonal allergies    Hypertension    IBS (irritable bowel syndrome)    LBBB (left bundle branch block)    Multiple pulmonary nodules 06/2013   RUL ([facility]. Adam Phenix [facility]) - ?vasculitis as of last CT at [facility]   NICM (nonischemic cardiomyopathy) [facility]    Cardiac catheterization March 2006 without coronary disease; EF 25% 2018 Jan   Orthopnea    [facility])    Presence of permanent cardiac pacemaker    Sinus congestion 09/25/2010    Past Surgical History:  Procedure Laterality Date   BACK SURGERY  1997   bulging disks   BiV-AICD implant     [employer]   CARDIAC CATHETERIZATION  06/22/04   Severe, nonischemic cardiomyopathy,EF 25-30%   CARDIOVERSION  02/22/09   AFlutter-([facility])   CATARACT EXTRACTION W/PHACO Right 12/21/2019   Procedure: CATARACT EXTRACTION PHACO AND INTRAOCULAR LENS PLACEMENT (IOC) RIGHT 2.23  00:21.9;  Surgeon: Eulogio Bear, MD;  Location: [place];  Service: Ophthalmology;  Laterality: Right;   CATARACT EXTRACTION W/PHACO Left 02/01/2020   Procedure: CATARACT EXTRACTION PHACO AND INTRAOCULAR LENS PLACEMENT (IOC) LEFT 3.67 00:39.5;  Surgeon: Eulogio Bear, MD;  Location: [place];  Service: Ophthalmology;  Laterality: Left;   CHOLECYSTECTOMY     COLONOSCOPY  10/99   Divertic, splenic, hepatic fleure only   COLONOSCOPY  10/21/08   aborted-divertics, int hemms (Dr. Vira Agar)   CORONARY ANGIOPLASTY     EP IMPLANTABLE DEVICE N/A 04/25/2015   Procedure: BIV ICD Generator Changeout;  Surgeon: Evans Lance, MD;  Location: [place] [facility];   Service: Cardiovascular;  Laterality: N/A;   ESOPHAGEAL DILATION  02/18/98   ESOPHAGOGASTRODUODENOSCOPY  01/1998   stricture, sliding HH, GERD   ESOPHAGOGASTRODUODENOSCOPY  04/08/01   stricture, gastritis, HH, GERD-no dilation(Dr. Henrene Pastor)   ESOPHAGOGASTRODUODENOSCOPY  12/22/04   stricture; gastritis; duodenitis, GERD   ESOPHAGOGASTRODUODENOSCOPY  10/21/08   Reflux esophagitis; Erythem. Duod. (Dr. Vira Agar)   ESOPHAGOGASTRODUODENOSCOPY  08/2013   erythema gastric fundus - minimal gastritis, HH ([place])   ESOPHAGOGASTRODUODENOSCOPY  08/2016   dysphagia - mod schatzki rin, dilated Tiffany Kocher)   ESOPHAGOGASTRODUODENOSCOPY (EGD) WITH PROPOFOL N/A 08/24/2016   mod schatzki ring dilated, duodenal deformity Manya Silvas, MD)   goiter removal     HERNIA REPAIR     HERNIA REPAIR  01/30/02   Dr. Smith Mince / REPLACE / REMOVE PACEMAKER  2007 and 2017   JOINT REPLACEMENT Right    shoulder   NOSE SURGERY  1971   sinus surgery   PENILE PROSTHESIS IMPLANT  2007   [place]   PFT  10/2013   FVC 61%, FEV1 63%, ratio 0.76   Pulmonary eval  04/2002   ([facility]) Chronic congestive symptoms   REVERSE SHOULDER ARTHROPLASTY Right 01/17/2016   Procedure: REVERSE SHOULDER ARTHROPLASTY;  Surgeon: Corky Mull, MD;  Location: [facility];  Service: Orthopedics;  Laterality: Right;   REVERSE TOTAL SHOULDER ARTHROPLASTY Right 01/2016   [place]   US ECHOCARDIOGRAPHY  07/13/04   EF 25-30%, Mod LVH; LA severe dilation; Mild AR; IRTR   US ECHOCARDIOGRAPHY  11/27/06   hypokinesis posterior wall, EF 35%; mild AR     Home Medications:  Prior to Admission medications   Medication Sig Start Date End Date Taking? Authorizing Provider  acetaminophen (TYLENOL) 500 MG tablet Take 250 mg by mouth daily as needed. Pain    Yes [provider]  allopurinol (ZYLOPRIM) 100 MG tablet Take 100 mg by mouth daily. Per patient taking 2 tablets (200 mg)   Yes [provider]  ALPRAZolam (XANAX) 0.25 MG tablet TAKE 1/2 TO 1  TABLET (0.125-0.25 MG TOTAL) BY MOUTH AT BEDTIME AS NEEDED FOR ANXIETY OR SLEEP. Patient taking differently: 0.125 mg 2 (two) times daily as needed for anxiety or sleep. 11/25/20  Yes Ria Bush, MD  alum & mag hydroxide-simeth (MAALOX/MYLANTA) 200-200-20 MG/5ML suspension Take 15 mLs by mouth daily as needed for indigestion or heartburn.   Yes [provider]  Azelastine HCl 137 MCG/SPRAY SOLN Place 1 spray into both nostrils 2 (two) times daily. 11/27/20  Yes [provider]  carvedilol (COREG) 6.25 MG tablet TAKE 1 AND 1/2 TABLETS BY MOUTH TWICE DAILY 04/04/20  Yes [name], MD  cefdinir (OMNICEF) 300 MG capsule Take 1 capsule by mouth 2 (two) times daily. 12/03/20 12/13/20 Yes [provider]  fluticasone (FLONASE) 50 MCG/ACT nasal spray Place  1 spray into both nostrils daily.   Yes [provider]  montelukast (SINGULAIR) 10 MG tablet Take 10 mg by mouth at bedtime.   Yes [provider]  predniSONE (DELTASONE) 5 MG tablet Take 5 mg by mouth 2 (two) times daily. 11/17/20  Yes [provider]  tamsulosin (FLOMAX) 0.4 MG CAPS capsule Take 2 capsules (0.8 mg total) by mouth daily after supper. 07/06/20  Yes Billey Co, MD  torsemide (DEMADEX) 20 MG tablet TAKE 2 TABLETS BY MOUTH 2 TIMES DAILY. 09/09/20  Yes [name], MD  vitamin B-12 (CYANOCOBALAMIN) 500 MCG tablet Take 2 tablets (1,000 mcg total) by mouth daily. 11/16/19  Yes Ria Bush, MD  warfarin (COUMADIN) 5 MG tablet TAKE 1/2 TO 1 TABLET BY MOUTH DAILY AS DIRECTED BY COUMADIN CLINIC Patient taking differently: Take 5 mg by mouth 2 (two) times a week. Mondays and Fridays 12/30/19  Yes Evans Lance, MD  warfarin (COUMADIN) 5 MG tablet Take 2.5 mg by mouth as directed. 5 days weekly. All days except Monday and Friday   Yes [provider]  polyethylene glycol (MIRALAX / GLYCOLAX) packet Take 17 g by mouth daily as needed (constipation).  Patient not taking:  No sig reported    [provider]  potassium chloride SA (KLOR-CON) 20 MEQ tablet Take 2 tablets (40 mEq total) by mouth once for 1 dose. Patient not taking: No sig reported 09/23/20 12/05/20  [name], MD    Inpatient Medications: Scheduled Meds:  allopurinol  100 mg Oral Daily   azelastine  1 spray Each Nare BID   carvedilol  3.125 mg Oral BID WC   fluticasone  1 spray Each Nare Daily   furosemide  80 mg Intravenous Q12H   metolazone  2.5 mg Oral Once   [START ON 12/08/2020] metolazone  2.5 mg Oral Daily   montelukast  10 mg Oral QHS   polyethylene glycol  17 g Oral Daily   predniSONE  5 mg Oral BID   vitamin B-12  1,000 mcg Oral Daily   Warfarin - Pharmacist Dosing Inpatient   Does not apply q1600   Continuous Infusions:  PRN Meds: acetaminophen, albuterol, ALPRAZolam, alum & mag hydroxide-simeth, dextromethorphan-guaiFENesin, ondansetron (ZOFRAN) IV  Allergies:    Allergies  Allergen Reactions   Carafate [Sucralfate] Other (See Comments)    Burns stomach   Clarithromycin Nausea Only   Famotidine Other (See Comments)    ABD. PAIN   Levaquin [Levofloxacin] Other (See Comments)    Stomach pain, has to eat a lot of food   Omeprazole Diarrhea   Oxytetracycline Other (See Comments)    BUMPS   Penicillins Swelling    Amoxicillin ok, Pt states he had to have a shot to reverse the reaction Has patient had a PCN reaction causing immediate rash, facial/tongue/throat swelling, SOB or lightheadedness with hypotension: Yes Has patient had a PCN reaction causing severe rash involving mucus membranes or skin necrosis: No Has patient had a PCN reaction that required hospitalization No Has patient had a PCN reaction occurring within the last 10 years: Yes If all of the above answers are "NO", then may proceed with   Proton Pump Inhibitors Other (See Comments)    GI upset, diarrhea, gas, bloating   Ranitidine Diarrhea   Doxycycline Rash    Social History:   Social  History   Socioeconomic History   Marital status: Married    Spouse name: Not on file   Number of children: 3  Years of education: Not on file   Highest education level: Not on file  Occupational History   Occupation: retired    Fish farm manager: RETIRED  Tobacco Use   Smoking status: Never   Smokeless tobacco: Never  Vaping Use   Vaping Use: Never used  Substance and Sexual Activity   Alcohol use: No   Drug use: No   Sexual activity: Yes  Other Topics Concern   Not on file  Social History Narrative   Lives with wife   3 children-8 grandchildren   Still works on a farm.   Retired from [employer]   Had [employer]   Good friend with Dr. Jefm Bryant   Activity: No regular exercise   Diet: good water, fruits/vegetables daily   Social Determinants of Health   Financial Resource Strain: Not on file  Food Insecurity: Not on file  Transportation Needs: Not on file  Physical Activity: Not on file  Stress: Not on file  Social Connections: Not on file  Intimate Partner Violence: Not on file    Family History:    Family History  Problem Relation Age of Onset   Coronary artery disease Father    Hypertension Father    Heart failure Mother    Dysphagia Sister    Breast cancer Other    Ovarian cancer Other    Uterine cancer Other    Other Sister        stomach problems   Other Sister        stomach problems   Other Sister        stomach problems   Alcohol abuse Maternal Uncle      ROS:  Please see the history of present illness.   All other ROS reviewed and negative.     Physical Exam/Data:   Vitals:   12/06/20 1952 12/07/20 0325 12/07/20 0712 12/07/20 1540  BP: (!) 92/48 107/70 114/67 115/78  Pulse: (!) 59 63 61   Resp: '20 18 18 20  '$ Temp: (!) 97.3 F (36.3 C) 97.6 F (36.4 C) 97.9 F (36.6 C) 97.8 F (36.6 C)  TempSrc:    Oral  SpO2: 100% 97% 96% 98%  Weight:  63.8 kg    Height:        Intake/Output Summary (Last 24 hours) at 12/07/2020 1656 Last  data filed at 12/07/2020 [id number] Gross per 24 hour  Intake 300 ml  Output 480 ml  Net -180 ml   Last 3 Weights 12/07/2020 12/06/2020 12/06/2020  Weight (lbs) 140 lb 11.2 oz 140 lb 9.6 oz 140 lb 9.6 oz  Weight (kg) 63.821 kg 63.776 kg 63.776 kg     Body mass index is 24.15 kg/m.  General:  Well nourished, no acute distress HEENT: normal Lymph: no adenopathy Neck: no JVD Endocrine:  No thryomegaly Vascular: No carotid bruits; FA pulses 2+ bilaterally without bruits  Cardiac:  normal S1, S2; RRR; distant heart sounds Lungs: Appears clear anteriorly Abd: soft, nontender, mildly distended. Ext: 2+ edema, varicose veins Musculoskeletal:  No deformities, BUE and BLE strength normal and equal Skin: warm and dry  Neuro:  CNs 2-12 intact, no focal abnormalities noted Psych:  Normal affect   EKG:  The EKG was personally reviewed and demonstrates: V paced rhythm Telemetry:  Telemetry was personally reviewed and demonstrates: V paced rhythm  Relevant CV Studies: TTE 12/06/2020 1. Left ventricular ejection fraction, by estimation, is 20 to 25%. The  left ventricle has severely decreased function. The left ventricle  demonstrates global  hypokinesis. Left ventricular diastolic parameters are  indeterminate. The average left  ventricular global longitudinal strain is -9.0 %. The global longitudinal  strain is abnormal.   2. Right ventricular systolic function is moderately reduced. The right  ventricular size is normal. There is mildly elevated pulmonary artery  systolic pressure.   3. Left atrial size was severely dilated.   4. Right atrial size was moderately dilated.   5. The mitral valve is abnormal. Mild mitral valve regurgitation.   6. The aortic valve was not well visualized. Aortic valve regurgitation  is mild. No aortic stenosis is present.   7. The inferior vena cava is normal in size with <50% respiratory  variability, suggesting right atrial pressure of 8 mmHg.   Laboratory  Data:  High Sensitivity Troponin:   Recent Labs  Lab 12/04/20 1956 12/04/20 2242  TROPONINIHS 4 4     Chemistry Recent Labs  Lab 12/05/20 0838 12/06/20 0401 12/07/20 0412  NA 128* 131* 131*  K 4.3 3.4* 4.0  CL 94* 99 97*  CO2 '26 26 25  '$ GLUCOSE 108* 94 120*  BUN 43* 37* 36*  CREATININE 1.72* 1.43* 1.54*  CALCIUM 8.8* 8.3* 8.6*  GFRNONAA 38* 47* 43*  ANIONGAP '8 6 9    '$ No results for input(s): PROT, ALBUMIN, AST, ALT, ALKPHOS, BILITOT in the last 168 hours. Hematology Recent Labs  Lab 12/04/20 1956  WBC 8.8  RBC 4.18*  HGB 12.9*  HCT 37.2*  MCV 89.0  MCH 30.9  MCHC 34.7  RDW 17.7*  PLT 110*   BNP Recent Labs  Lab 12/04/20 1956  BNP 2,501.7*    DDimer No results for input(s): DDIMER in the last 168 hours.   Radiology/Studies:  DG Chest 2 View  Result Date: 12/04/2020 CLINICAL DATA:  Shortness of breath. EXAM: CHEST - 2 VIEW COMPARISON:  June 17, 2020 FINDINGS: There is a multi lead AICD. Stable, diffuse, chronic appearing increased interstitial lung markings are seen. Mild areas of atelectasis are noted within the bilateral lung bases. Small bilateral pleural effusions are seen. There is no evidence of a pneumothorax. There is moderate to marked severity cardiac silhouette enlargement. A radiopaque right shoulder replacement is present. IMPRESSION: 1. Stable cardiomegaly with mild bibasilar atelectasis. 2. Small bilateral pleural effusions. Electronically Signed   By: Virgina Norfolk M.D.   On: 12/04/2020 20:26   ECHOCARDIOGRAM COMPLETE  Result Date: 12/06/2020    ECHOCARDIOGRAM REPORT   Patient Name:   MARELL HIRANI Date of Exam: 12/06/2020 Medical Rec #:  [id number]             Height:       64.0 in Accession #:    [id number]            Weight:       140.6 lb Date of Birth:  [birth year]             BSA:          1.684 m Patient Age:    82 years              BP:           105/64 mmHg Patient Gender: M                     HR:           70 bpm. Exam  Location:  [facility] Procedure: 2D Echo, Cardiac Doppler, Color Doppler and Strain Analysis Indications:  CHF-acute systolic [id number]  History:         Patient has prior history of Echocardiogram examinations, most                  recent 06/18/2020. AICD, ischemic cardiomyopathy.  Sonographer:     Sherrie Sport Referring Phys:  [id number] Ivor Costa Diagnosing Phys: Nelva Bush MD  Sonographer Comments: Global longitudinal strain was attempted. IMPRESSIONS  1. Left ventricular ejection fraction, by estimation, is 20 to 25%. The left ventricle has severely decreased function. The left ventricle demonstrates global hypokinesis. Left ventricular diastolic parameters are indeterminate. The average left ventricular global longitudinal strain is -9.0 %. The global longitudinal strain is abnormal.  2. Right ventricular systolic function is moderately reduced. The right ventricular size is normal. There is mildly elevated pulmonary artery systolic pressure.  3. Left atrial size was severely dilated.  4. Right atrial size was moderately dilated.  5. The mitral valve is abnormal. Mild mitral valve regurgitation.  6. The aortic valve was not well visualized. Aortic valve regurgitation is mild. No aortic stenosis is present.  7. The inferior vena cava is normal in size with <50% respiratory variability, suggesting right atrial pressure of 8 mmHg. FINDINGS  Left Ventricle: Left ventricular size and wall thickness not well-assessed. Left ventricular ejection fraction, by estimation, is 20 to 25%. The left ventricle has severely decreased function. The left ventricle demonstrates global hypokinesis. The average left ventricular global longitudinal strain is -9.0 %. The global longitudinal strain is abnormal. Left ventricular diastolic parameters are indeterminate. Right Ventricle: The right ventricular size is normal. Right vetricular wall thickness was not well visualized. Right ventricular systolic function is moderately reduced. There is  mildly elevated pulmonary artery systolic pressure. The tricuspid regurgitant velocity is 3.03 m/s, and with an assumed right atrial pressure of 8 mmHg, the estimated right ventricular systolic pressure is 123456 mmHg. Left Atrium: Left atrial size was severely dilated. Right Atrium: Right atrial size was moderately dilated. Pericardium: There is no evidence of pericardial effusion. Mitral Valve: The mitral valve is abnormal. There is mild thickening of the mitral valve leaflet(s). Mild mitral valve regurgitation. Tricuspid Valve: The tricuspid valve is not well visualized. Tricuspid valve regurgitation is trivial. Aortic Valve: The aortic valve was not well visualized. Aortic valve regurgitation is mild. No aortic stenosis is present. Aortic valve mean gradient measures 2.0 mmHg. Aortic valve peak gradient measures 3.1 mmHg. Aortic valve area, by VTI measures 3.06  cm. Pulmonic Valve: The pulmonic valve was not well visualized. Aorta: The aortic root was not well visualized. Venous: The inferior vena cava is normal in size with less than 50% respiratory variability, suggesting right atrial pressure of 8 mmHg. IAS/Shunts: The interatrial septum was not well visualized. Additional Comments: A device lead is visualized in the right atrium and right ventricle.  LEFT VENTRICLE PLAX 2D LVIDd:         5.05 cm LVIDs:         4.56 cm      2D Longitudinal Strain LV PW:         0.96 cm      2D Strain GLS Avg:     -9.0 % LV IVS:        0.72 cm LVOT diam:     2.00 cm LV SV:         48 LV SV Index:   28           3D Volume EF: LVOT Area:  3.14 cm     3D EF:        42 %                             LV EDV:       166 ml                             LV ESV:       96 ml LV Volumes (MOD)            LV SV:        70 ml LV vol d, MOD A2C: 133.0 ml LV vol d, MOD A4C: 110.0 ml LV vol s, MOD A2C: 102.0 ml LV vol s, MOD A4C: 72.7 ml LV SV MOD A2C:     31.0 ml LV SV MOD A4C:     110.0 ml LV SV MOD BP:      37.0 ml RIGHT VENTRICLE RV Basal  diam:  3.25 cm RV S prime:     7.07 cm/s TAPSE (M-mode): 1.2 cm LEFT ATRIUM              Index       RIGHT ATRIUM           Index LA diam:        4.90 cm  2.91 cm/m  RA Area:     28.50 cm LA Vol (A2C):   96.9 ml  57.53 ml/m RA Volume:   91.40 ml  54.27 ml/m LA Vol (A4C):   113.0 ml 67.09 ml/m LA Biplane Vol: 108.0 ml 64.12 ml/m  AORTIC VALVE AV Area (Vmax):    2.70 cm AV Area (Vmean):   2.62 cm AV Area (VTI):     3.06 cm AV Vmax:           87.50 cm/s AV Vmean:          62.400 cm/s AV VTI:            0.156 m AV Peak Grad:      3.1 mmHg AV Mean Grad:      2.0 mmHg LVOT Vmax:         75.10 cm/s LVOT Vmean:        52.000 cm/s LVOT VTI:          0.152 m LVOT/AV VTI ratio: 0.97 MITRAL VALVE               TRICUSPID VALVE MV Area (PHT): 4.49 cm    TR Peak grad:   36.7 mmHg MV Decel Time: 169 msec    TR Vmax:        303.00 cm/s MV E velocity: 85.30 cm/s                            SHUNTS                            Systemic VTI:  0.15 m                            Systemic Diam: 2.00 cm Nelva Bush MD Electronically signed by Nelva Bush MD Signature Date/Time: 12/06/2020/11:45:03 AM    Final      Assessment and Plan:   Nonischemic cardiomyopathy EF 20  to 25% -Appears slightly volume overloaded.  2+ edema -Continue IV Lasix 80 mg bid -Start metolazone 2.5 mg daily. -Monitor ins and outs, creatinine. -Continue Coreg.  Low blood pressures preventing up titration of CHF medications at this time.  2.  Permanent A. Fib -Warfarin per pharmacy protocol  3.  S/p AICD -Device seems to be working appropriately.   -Telemetry currently shows V paced rhythm.   Total encounter time 30 minutes  Greater than 50% was spent in counseling and coordination of care with the patient   Signed, Kate Sable, MD  12/07/2020 4:56 PM

## 2020-12-07 NOTE — Progress Notes (Addendum)
Progress Note    [name]  [id number] DOB: 1931-04-15  DOA: 12/05/2020 PCP: Ria Bush, MD      Brief Narrative:    Medical records reviewed and are as summarized below:  [name] is a 87 y.o. male with medical history significant of sCHF with EF of 20-25%, hypertension, asthma, GERD, gout, anxiety, AICD placement, left bundle blockage, porphyruria, IBS, GI bleeding, diverticulitis, BPH, atrial fibrillation on Coumadin, anemia, aortic valve regurgitation, esophageal stricture, pulmonary hypertension, thrombocytopenia, CKD-3A, recent sinus infection.  He presented to the hospital with increasing shortness of breath, 5 pound weight gain and decreased oxygen saturation in the 70s.  He was admitted to the hospital for acute exacerbation of chronic systolic CHF and acute hypoxic respiratory failure.    Assessment/Plan:   Principal Problem:   Acute on chronic systolic CHF (congestive heart failure) (HCC) Active Problems:   Anxiety   BPH (benign prostatic hyperplasia)   Thrombocytopenia (HCC)   Chronic atrial fibrillation (HCC)   HTN (hypertension)   Asthma   Gout   Acute on chronic respiratory failure with hypoxia (HCC)   CKD (chronic kidney disease), stage IIIa   Hyponatremia   Hyperkalemia   Sinus infection    Body mass index is 24.15 kg/m.   Acute exacerbation of chronic systolic CHF, peripheral edema: Continue IV Lasix.  Monitor BMP daily weight.  2D echo on 12/07/2018 showed EF estimated at 20 to 25% (unchanged from 2D echo in March 2022).  Family requested a cardiology consult and cardiologist has been consulted.  Acute hypoxic respiratory failure: Improved.  He is tolerating room air.  Hypotension: BP is better.  Monitor BP closely.  Permanent atrial fibrillation: Continue Coumadin and monitor INR.  Chronic hyponatremia: Asymptomatic.  Recent sinus infection: Discontinue Omnicef.  Immunocompromised state: He is on  chronic prednisone therapy.  Other comorbidities include anxiety, BPH, chronic thrombocytopenia, asthma, gout   Diet Order             Diet regular Room service appropriate? Yes; Fluid consistency: Thin; Fluid restriction: 1200 mL Fluid  Diet effective now                      Consultants: None  Procedures: None    Medications:    allopurinol  100 mg Oral Daily   azelastine  1 spray Each Nare BID   carvedilol  3.125 mg Oral BID WC   fluticasone  1 spray Each Nare Daily   furosemide  80 mg Intravenous Q12H   montelukast  10 mg Oral QHS   polyethylene glycol  17 g Oral Daily   predniSONE  5 mg Oral BID   vitamin B-12  1,000 mcg Oral Daily   Warfarin - Pharmacist Dosing Inpatient   Does not apply q1600   Continuous Infusions:   Anti-infectives (From admission, onward)    Start     Dose/Rate Route Frequency Ordered Stop   12/08/20 1000  cefdinir (OMNICEF) capsule 300 mg  Status:  Discontinued        300 mg Oral Daily 12/07/20 0824 12/07/20 1156   12/06/20 1000  cefdinir (OMNICEF) capsule 300 mg  Status:  Discontinued        300 mg Oral Every 12 hours 12/06/20 0715 12/07/20 0824   12/05/20 2200  cefdinir (OMNICEF) capsule 300 mg  Status:  Discontinued        300 mg Oral Every 24 hours 12/05/20 1903 12/06/20 0715  Family Communication/Anticipated D/C date and plan/Code Status   DVT prophylaxis:      Code Status: DNR  Family Communication: Wife at the bedside Disposition Plan:    Status is: Inpatient  Remains inpatient appropriate because:IV treatments appropriate due to intensity of illness or inability to take PO  Dispo: The patient is from: Home              Anticipated d/c is to: Home              Patient currently is not medically stable to d/c.   Difficult to place patient No           Subjective:   C/o shortness of breath with mild activity and leg swelling  Objective:    Vitals:   12/06/20 1639 12/06/20  1952 12/07/20 0325 12/07/20 0712  BP: 105/66 (!) 92/48 107/70 114/67  Pulse: 60 (!) 59 63 61  Resp: '19 20 18 18  '$ Temp: 98.1 F (36.7 C) (!) 97.3 F (36.3 C) 97.6 F (36.4 C) 97.9 F (36.6 C)  TempSrc: Oral     SpO2: 100% 100% 97% 96%  Weight:   63.8 kg   Height:       No data found.   Intake/Output Summary (Last 24 hours) at 12/07/2020 1156 Last data filed at 12/07/2020 [id number] Gross per 24 hour  Intake 540 ml  Output 480 ml  Net 60 ml   Filed Weights   12/06/20 0005 12/06/20 0438 12/07/20 0325  Weight: 63.8 kg 63.8 kg 63.8 kg    Exam:  GEN: NAD SKIN: Warm and dry EYES: No pallor or icterus ENT: MMM CV: RRR PULM: Bibasilar rales.  No wheezing heard ABD: soft, obese, NT, +BS CNS: AAO x 3, non focal EXT: Bilateral leg and pedal edema (3+).  No tenderness       Data Reviewed:   I have personally reviewed following labs and imaging studies:  Labs: Labs show the following:   Basic Metabolic Panel: Recent Labs  Lab 12/04/20 1956 12/05/20 0838 12/06/20 0401 12/07/20 0412  NA 128* 128* 131* 131*  K 5.5* 4.3 3.4* 4.0  CL 92* 94* 99 97*  CO2 '25 26 26 25  '$ GLUCOSE 125* 108* 94 120*  BUN 38* 43* 37* 36*  CREATININE 1.66* 1.72* 1.43* 1.54*  CALCIUM 9.1 8.8* 8.3* 8.6*  MG  --   --  2.3  --    GFR Estimated Creatinine Clearance: 28.3 mL/min (A) (by C-G formula based on SCr of 1.54 mg/dL (H)). Liver Function Tests: No results for input(s): AST, ALT, ALKPHOS, BILITOT, PROT, ALBUMIN in the last 168 hours. No results for input(s): LIPASE, AMYLASE in the last 168 hours. No results for input(s): AMMONIA in the last 168 hours. Coagulation profile Recent Labs  Lab 12/04/20 1956 12/05/20 1457 12/06/20 0401 12/07/20 0412  INR 2.1* 1.9* 1.7* 1.6*    CBC: Recent Labs  Lab 12/04/20 1956  WBC 8.8  HGB 12.9*  HCT 37.2*  MCV 89.0  PLT 110*   Cardiac Enzymes: No results for input(s): CKTOTAL, CKMB, CKMBINDEX, TROPONINI in the last 168 hours. BNP (last 3  results) Recent Labs    09/01/20 1155  PROBNP 22,792*   CBG: No results for input(s): GLUCAP in the last 168 hours. D-Dimer: No results for input(s): DDIMER in the last 72 hours. Hgb A1c: No results for input(s): HGBA1C in the last 72 hours. Lipid Profile: No results for input(s): CHOL, HDL, LDLCALC, TRIG, CHOLHDL, LDLDIRECT in the  last 72 hours. Thyroid function studies: No results for input(s): TSH, T4TOTAL, T3FREE, THYROIDAB in the last 72 hours.  Invalid input(s): FREET3 Anemia work up: No results for input(s): VITAMINB12, FOLATE, FERRITIN, TIBC, IRON, RETICCTPCT in the last 72 hours. Sepsis Labs: Recent Labs  Lab 12/04/20 1956  WBC 8.8    Microbiology Recent Results (from the past 240 hour(s))  Resp Panel by RT-PCR (Flu A&B, Covid) Nasopharyngeal Swab     Status: None   Collection Time: 12/05/20  4:44 AM   Specimen: Nasopharyngeal Swab; Nasopharyngeal(NP) swabs in vial transport medium  Result Value Ref Range Status   SARS Coronavirus 2 by RT PCR NEGATIVE NEGATIVE Final    Comment: (NOTE) SARS-CoV-2 target nucleic acids are NOT DETECTED.  The SARS-CoV-2 RNA is generally detectable in upper respiratory specimens during the acute phase of infection. The lowest concentration of SARS-CoV-2 viral copies this assay can detect is 138 copies/mL. A negative result does not preclude SARS-Cov-2 infection and should not be used as the sole basis for treatment or other patient management decisions. A negative result may occur with  improper specimen collection/handling, submission of specimen other than nasopharyngeal swab, presence of viral mutation(s) within the areas targeted by this assay, and inadequate number of viral copies(<138 copies/mL). A negative result must be combined with clinical observations, patient history, and epidemiological information. The expected result is Negative.  Fact Sheet for Patients:  EntrepreneurPulse.com.au  Fact Sheet  for Healthcare Providers:  IncredibleEmployment.be  This test is no t yet approved or cleared by the [place] FDA and  has been authorized for detection and/or diagnosis of SARS-CoV-2 by FDA under an Emergency Use Authorization (EUA). This EUA will remain  in effect (meaning this test can be used) for the duration of the COVID-19 declaration under Section 564(b)(1) of the Act, 21 U.S.C.section 360bbb-3(b)(1), unless the authorization is terminated  or revoked sooner.       Influenza A by PCR NEGATIVE NEGATIVE Final   Influenza B by PCR NEGATIVE NEGATIVE Final    Comment: (NOTE) The Xpert Xpress SARS-CoV-2/FLU/RSV plus assay is intended as an aid in the diagnosis of influenza from Nasopharyngeal swab specimens and should not be used as a sole basis for treatment. Nasal washings and aspirates are unacceptable for Xpert Xpress SARS-CoV-2/FLU/RSV testing.  Fact Sheet for Patients: EntrepreneurPulse.com.au  Fact Sheet for Healthcare Providers: IncredibleEmployment.be  This test is not yet approved or cleared by the [place] FDA and has been authorized for detection and/or diagnosis of SARS-CoV-2 by FDA under an Emergency Use Authorization (EUA). This EUA will remain in effect (meaning this test can be used) for the duration of the COVID-19 declaration under Section 564(b)(1) of the Act, 21 U.S.C. section 360bbb-3(b)(1), unless the authorization is terminated or revoked.  Performed at [facility], [address], [place], [place] [address]     Procedures and diagnostic studies:  ECHOCARDIOGRAM COMPLETE  Result Date: 12/06/2020    ECHOCARDIOGRAM REPORT   Patient Name:   [name] Date of Exam: 12/06/2020 Medical Rec #:  [id number]             Height:       64.0 in Accession #:    [id number]            Weight:       140.6 lb Date of Birth:  Jun 05, 1931             BSA:          1.684  m Patient Age:     70 years              BP:           105/64 mmHg Patient Gender: M                     HR:           70 bpm. Exam Location:  [facility] Procedure: 2D Echo, Cardiac Doppler, Color Doppler and Strain Analysis Indications:     CHF-acute systolic [id number]  History:         Patient has prior history of Echocardiogram examinations, most                  recent 06/18/2020. AICD, ischemic cardiomyopathy.  Sonographer:     Sherrie Sport Referring Phys:  [id number] Ivor Costa Diagnosing Phys: Nelva Bush MD  Sonographer Comments: Global longitudinal strain was attempted. IMPRESSIONS  1. Left ventricular ejection fraction, by estimation, is 20 to 25%. The left ventricle has severely decreased function. The left ventricle demonstrates global hypokinesis. Left ventricular diastolic parameters are indeterminate. The average left ventricular global longitudinal strain is -9.0 %. The global longitudinal strain is abnormal.  2. Right ventricular systolic function is moderately reduced. The right ventricular size is normal. There is mildly elevated pulmonary artery systolic pressure.  3. Left atrial size was severely dilated.  4. Right atrial size was moderately dilated.  5. The mitral valve is abnormal. Mild mitral valve regurgitation.  6. The aortic valve was not well visualized. Aortic valve regurgitation is mild. No aortic stenosis is present.  7. The inferior vena cava is normal in size with <50% respiratory variability, suggesting right atrial pressure of 8 mmHg. FINDINGS  Left Ventricle: Left ventricular size and wall thickness not well-assessed. Left ventricular ejection fraction, by estimation, is 20 to 25%. The left ventricle has severely decreased function. The left ventricle demonstrates global hypokinesis. The average left ventricular global longitudinal strain is -9.0 %. The global longitudinal strain is abnormal. Left ventricular diastolic parameters are indeterminate. Right Ventricle: The right ventricular size is normal. Right  vetricular wall thickness was not well visualized. Right ventricular systolic function is moderately reduced. There is mildly elevated pulmonary artery systolic pressure. The tricuspid regurgitant velocity is 3.03 m/s, and with an assumed right atrial pressure of 8 mmHg, the estimated right ventricular systolic pressure is 123456 mmHg. Left Atrium: Left atrial size was severely dilated. Right Atrium: Right atrial size was moderately dilated. Pericardium: There is no evidence of pericardial effusion. Mitral Valve: The mitral valve is abnormal. There is mild thickening of the mitral valve leaflet(s). Mild mitral valve regurgitation. Tricuspid Valve: The tricuspid valve is not well visualized. Tricuspid valve regurgitation is trivial. Aortic Valve: The aortic valve was not well visualized. Aortic valve regurgitation is mild. No aortic stenosis is present. Aortic valve mean gradient measures 2.0 mmHg. Aortic valve peak gradient measures 3.1 mmHg. Aortic valve area, by VTI measures 3.06  cm. Pulmonic Valve: The pulmonic valve was not well visualized. Aorta: The aortic root was not well visualized. Venous: The inferior vena cava is normal in size with less than 50% respiratory variability, suggesting right atrial pressure of 8 mmHg. IAS/Shunts: The interatrial septum was not well visualized. Additional Comments: A device lead is visualized in the right atrium and right ventricle.  LEFT VENTRICLE PLAX 2D LVIDd:         5.05 cm LVIDs:         4.56  cm      2D Longitudinal Strain LV PW:         0.96 cm      2D Strain GLS Avg:     -9.0 % LV IVS:        0.72 cm LVOT diam:     2.00 cm LV SV:         48 LV SV Index:   28           3D Volume EF: LVOT Area:     3.14 cm     3D EF:        42 %                             LV EDV:       166 ml                             LV ESV:       96 ml LV Volumes (MOD)            LV SV:        70 ml LV vol d, MOD A2C: 133.0 ml LV vol d, MOD A4C: 110.0 ml LV vol s, MOD A2C: 102.0 ml LV vol s, MOD  A4C: 72.7 ml LV SV MOD A2C:     31.0 ml LV SV MOD A4C:     110.0 ml LV SV MOD BP:      37.0 ml RIGHT VENTRICLE RV Basal diam:  3.25 cm RV S prime:     7.07 cm/s TAPSE (M-mode): 1.2 cm LEFT ATRIUM              Index       RIGHT ATRIUM           Index LA diam:        4.90 cm  2.91 cm/m  RA Area:     28.50 cm LA Vol (A2C):   96.9 ml  57.53 ml/m RA Volume:   91.40 ml  54.27 ml/m LA Vol (A4C):   113.0 ml 67.09 ml/m LA Biplane Vol: 108.0 ml 64.12 ml/m  AORTIC VALVE AV Area (Vmax):    2.70 cm AV Area (Vmean):   2.62 cm AV Area (VTI):     3.06 cm AV Vmax:           87.50 cm/s AV Vmean:          62.400 cm/s AV VTI:            0.156 m AV Peak Grad:      3.1 mmHg AV Mean Grad:      2.0 mmHg LVOT Vmax:         75.10 cm/s LVOT Vmean:        52.000 cm/s LVOT VTI:          0.152 m LVOT/AV VTI ratio: 0.97 MITRAL VALVE               TRICUSPID VALVE MV Area (PHT): 4.49 cm    TR Peak grad:   36.7 mmHg MV Decel Time: 169 msec    TR Vmax:        303.00 cm/s MV E velocity: 85.30 cm/s                            SHUNTS  Systemic VTI:  0.15 m                            Systemic Diam: 2.00 cm Nelva Bush MD Electronically signed by Nelva Bush MD Signature Date/Time: 12/06/2020/11:45:03 AM    Final                LOS: 2 days   Jazzalyn Loewenstein  [facility]   Pager on [email or web address]. If 7PM-7AM, please contact night-coverage at [email or web address]     12/07/2020, 11:56 AM

## 2020-12-07 NOTE — Progress Notes (Signed)
PHARMACY NOTE:  ANTIMICROBIAL RENAL DOSAGE ADJUSTMENT  Current antimicrobial regimen includes a mismatch between antimicrobial dosage and estimated renal function.  As per policy approved by the Pharmacy & Therapeutics and Medical Executive Committees, the antimicrobial dosage will be adjusted accordingly.  Current antimicrobial dosage:  Cefdinir 300 mg q12h  Indication: sinus infection  Renal Function:  Estimated Creatinine Clearance: 28.3 mL/min (A) (by C-G formula based on SCr of 1.54 mg/dL (H)).    Antimicrobial dosage has been changed to:  Cefdinir 300 mg q24h  Additional comments:   Thank you for allowing pharmacy to be a part of this patient's care.  Lu Duffel, PharmD, BCPS Clinical Pharmacist 12/07/2020 8:24 AM

## 2020-12-07 NOTE — Telephone Encounter (Signed)
Patient's son called to say that his father is the hospital at [facility]. He would like for Dr. [name] to visit the patient while in the hospital. Please advise

## 2020-12-08 ENCOUNTER — Telehealth: Payer: Self-pay

## 2020-12-08 DIAGNOSIS — I255 Ischemic cardiomyopathy: Secondary | ICD-10-CM

## 2020-12-08 DIAGNOSIS — J9621 Acute and chronic respiratory failure with hypoxia: Secondary | ICD-10-CM | POA: Diagnosis not present

## 2020-12-08 DIAGNOSIS — I5023 Acute on chronic systolic (congestive) heart failure: Secondary | ICD-10-CM | POA: Diagnosis not present

## 2020-12-08 DIAGNOSIS — I482 Chronic atrial fibrillation, unspecified: Secondary | ICD-10-CM | POA: Diagnosis not present

## 2020-12-08 LAB — PROTIME-INR
INR: 1.9 — ABNORMAL HIGH (ref 0.8–1.2)
Prothrombin Time: 21.3 seconds — ABNORMAL HIGH (ref 11.4–15.2)

## 2020-12-08 LAB — BASIC METABOLIC PANEL
Anion gap: 8 (ref 5–15)
BUN: 36 mg/dL — ABNORMAL HIGH (ref 8–23)
CO2: 28 mmol/L (ref 22–32)
Calcium: 8.5 mg/dL — ABNORMAL LOW (ref 8.9–10.3)
Chloride: 96 mmol/L — ABNORMAL LOW (ref 98–111)
Creatinine, Ser: 1.39 mg/dL — ABNORMAL HIGH (ref 0.61–1.24)
GFR, Estimated: 49 mL/min — ABNORMAL LOW (ref >60)
Glucose, Bld: 115 mg/dL — ABNORMAL HIGH (ref 70–99)
Potassium: 3.4 mmol/L — ABNORMAL LOW (ref 3.5–5.1)
Sodium: 132 mmol/L — ABNORMAL LOW (ref 135–145)

## 2020-12-08 MED ORDER — WARFARIN SODIUM 2.5 MG PO TABS
2.5000 mg | ORAL_TABLET | Freq: Once | ORAL | Status: AC
  Administered 2020-12-08: 2.5 mg via ORAL
  Filled 2020-12-08: qty 1

## 2020-12-08 MED ORDER — METOLAZONE 2.5 MG PO TABS
2.5000 mg | ORAL_TABLET | Freq: Every day | ORAL | Status: DC
  Administered 2020-12-08 – 2020-12-09 (×2): 2.5 mg via ORAL
  Filled 2020-12-08 (×2): qty 1

## 2020-12-08 MED ORDER — POTASSIUM CHLORIDE CRYS ER 20 MEQ PO TBCR
40.0000 meq | EXTENDED_RELEASE_TABLET | Freq: Once | ORAL | Status: AC
  Administered 2020-12-08: 40 meq via ORAL
  Filled 2020-12-08: qty 2

## 2020-12-08 MED ORDER — ALPRAZOLAM 0.25 MG PO TABS
0.2500 mg | ORAL_TABLET | Freq: Two times a day (BID) | ORAL | Status: DC | PRN
  Administered 2020-12-08 (×2): 0.25 mg via ORAL
  Filled 2020-12-08 (×2): qty 1

## 2020-12-08 NOTE — Progress Notes (Addendum)
Progress Note  Patient Name: LENARDO GUNTRUM Date of Encounter: 12/08/2020  [facility] Cardiologist: [name], MD   Subjective   Long discussion with patient and family at the bedside concerning recent events Reports compliance with his torsemide 40 twice daily He has had worsening lower extremity edema Increasing cough.  When family recommended he seek assistance, reported he was feeling fine Edema continue to get worse, urine output dropped off, weight has increased approximately 8 to 10 pounds Goal weight typically 132 up to 135, weight currently above 140 Reports he has been eating out at places like [employer], eating salty ham High coffee intake, soups  Inpatient Medications    Scheduled Meds:  allopurinol  100 mg Oral Daily   azelastine  1 spray Each Nare BID   carvedilol  3.125 mg Oral BID WC   fluticasone  1 spray Each Nare Daily   furosemide  80 mg Intravenous Q12H   [START ON 12/09/2020] metolazone  2.5 mg Oral Daily   montelukast  10 mg Oral QHS   polyethylene glycol  17 g Oral Daily   predniSONE  5 mg Oral BID   vitamin B-12  1,000 mcg Oral Daily   Warfarin - Pharmacist Dosing Inpatient   Does not apply q1600   Continuous Infusions:  PRN Meds: acetaminophen, albuterol, ALPRAZolam, alum & mag hydroxide-simeth, dextromethorphan-guaiFENesin, ondansetron (ZOFRAN) IV   Vital Signs    Vitals:   12/08/20 0408 12/08/20 0834 12/08/20 0914 12/08/20 1251  BP: 107/69 112/74  105/67  Pulse: (!) 57 (!) 58  (!) 59  Resp: '20 17  16  '$ Temp: 97.6 F (36.4 C) (!) 97.4 F (36.3 C)  (!) 97.5 F (36.4 C)  TempSrc:    Oral  SpO2: 94% 97%  100%  Weight: 65.5 kg  64.3 kg   Height:        Intake/Output Summary (Last 24 hours) at 12/08/2020 1432 Last data filed at 12/08/2020 1300 Gross per 24 hour  Intake 960 ml  Output 2100 ml  Net -1140 ml   Last 3 Weights 12/08/2020 12/08/2020 12/07/2020  Weight (lbs) 141 lb 12.8 oz 144 lb 8 oz 140 lb 11.2 oz  Weight  (kg) 64.32 kg 65.545 kg 63.821 kg      Telemetry    Normal sinus rhythm- Personally Reviewed  ECG    - Personally Reviewed  Physical Exam   GEN: No acute distress.   Neck:  JVD 10+ Cardiac: Irregularly irregular, no murmurs, rubs, or gallops.  1-2+ pitting edema feet and ankles Respiratory: Clear to auscultation bilaterally. GI: Soft, nontender, non-distended  MS: No edema; No deformity. Neuro:  Nonfocal  Psych: Normal affect   Labs    High Sensitivity Troponin:   Recent Labs  Lab 12/04/20 1956 12/04/20 2242  TROPONINIHS 4 4      Chemistry Recent Labs  Lab 12/06/20 0401 12/07/20 0412 12/08/20 0536  NA 131* 131* 132*  K 3.4* 4.0 3.4*  CL 99 97* 96*  CO2 '26 25 28  '$ GLUCOSE 94 120* 115*  BUN 37* 36* 36*  CREATININE 1.43* 1.54* 1.39*  CALCIUM 8.3* 8.6* 8.5*  GFRNONAA 47* 43* 49*  ANIONGAP '6 9 8     '$ Hematology Recent Labs  Lab 12/04/20 1956  WBC 8.8  RBC 4.18*  HGB 12.9*  HCT 37.2*  MCV 89.0  MCH 30.9  MCHC 34.7  RDW 17.7*  PLT 110*    BNP Recent Labs  Lab 12/04/20 1956  BNP 2,501.7*  DDimer No results for input(s): DDIMER in the last 168 hours.   Radiology    No results found.  Cardiac Studies     Patient Profile     KOLTAN YANAGI is a 87 y.o. male with a hx of nonischemic cardiomyopathy, AICD who is being seen 12/07/2020 for the evaluation of shortness of breath, acute on chronic diastolic and systolic CHF   Assessment & Plan    Nonischemic cardiomyopathy/pulmonary hypertension EF 20 to 25% Baseline weight 132 to 135 pounds, current weight over 140 pounds Has not been adjusting his diuretic depending on weight gain Recent high salt and fluid intake = Still with significant leg edema, shortness of breath on walking to the bathroom -We will continue metolazone 2.5 mg daily, IV Lasix twice daily -Continue Coreg, = Discussed diuretic sliding scale at home Would recommend torsemide 40 twice daily weight 1 32-1  35 Recommend torsemide 60 twice daily for weight 136-139 Recommend metolazone 2.5 up to 5 mg with torsemide 60 twice daily for weight 140 or higher --Will need metolazone prescription for home  2.  Permanent A. Fib -Warfarin  Rate control with carvedilol, continue outpatient dosing   3.  S/p AICD -Telemetry currently shows V paced rhythm.   Long discussion with family concerning diuretic sliding scale as detailed above Discussed fluid, salt intake  Total encounter time more than 35 minutes  Greater than 50% was spent in counseling and coordination of care with the patient   For questions or updates, please contact [place] Please consult [email or web address] for contact info under        Signed, Ida Rogue, MD  12/08/2020, 2:32 PM

## 2020-12-08 NOTE — Progress Notes (Deleted)
PHARMACY NOTE:  ANTIMICROBIAL RENAL DOSAGE ADJUSTMENT  Current antimicrobial regimen includes a mismatch between antimicrobial dosage and estimated renal function.  As per policy approved by the Pharmacy & Therapeutics and Medical Executive Committees, the antimicrobial dosage will be adjusted accordingly.  Current antimicrobial dosage:  Cefdinir 300 mg q24h  Indication: sinus infection  Renal Function:  Estimated Creatinine Clearance: 31.4 mL/min (A) (by C-G formula based on SCr of 1.39 mg/dL (H)).    Antimicrobial dosage has been changed to:  Cefdinir 300 mg q12h  Additional comments:   Thank you for allowing pharmacy to be a part of this patient's care.  Lu Duffel, PharmD, BCPS Clinical Pharmacist 12/08/2020 7:18 AM

## 2020-12-08 NOTE — Telephone Encounter (Signed)
Spoke with pt son, Octavia Bruckner, okay per DPR. He was able to see message sent via MyChart, appreciative for the follow up and no additional questions at this time.

## 2020-12-08 NOTE — Care Management Important Message (Signed)
Important Message  Patient Details  Name: [name] MRN: [id number] Date of Birth: Jan 26, 1932   Medicare Important Message Given:  Yes     Dannette Barbara 12/08/2020, 1:46 PM

## 2020-12-08 NOTE — Consult Note (Signed)
[place] for warfarin Indication: atrial flutter   Allergies  Allergen Reactions   Carafate [Sucralfate] Other (See Comments)    Burns stomach   Clarithromycin Nausea Only   Famotidine Other (See Comments)    ABD. PAIN   Levaquin [Levofloxacin] Other (See Comments)    Stomach pain, has to eat a lot of food   Omeprazole Diarrhea   Oxytetracycline Other (See Comments)    BUMPS   Penicillins Swelling    Amoxicillin ok, Pt states he had to have a shot to reverse the reaction Has patient had a PCN reaction causing immediate rash, facial/tongue/throat swelling, SOB or lightheadedness with hypotension: Yes Has patient had a PCN reaction causing severe rash involving mucus membranes or skin necrosis: No Has patient had a PCN reaction that required hospitalization No Has patient had a PCN reaction occurring within the last 10 years: Yes If all of the above answers are "NO", then may proceed with   Proton Pump Inhibitors Other (See Comments)    GI upset, diarrhea, gas, bloating   Ranitidine Diarrhea   Doxycycline Rash    Patient Measurements: Height: '5\' 4"'$  (162.6 cm) Weight: 65.5 kg (144 lb 8 oz) IBW/kg (Calculated) : 59.2  Vital Signs: Temp: 97.6 F (36.4 C) (09/01 0408) BP: 107/69 (09/01 0408) Pulse Rate: 57 (09/01 0408)  Labs: Recent Labs    12/06/20 0401 12/07/20 0412 12/08/20 0536  LABPROT 19.6* 19.3* 21.3*  INR 1.7* 1.6* 1.9*  CREATININE 1.43* 1.54* 1.39*     Estimated Creatinine Clearance: 31.4 mL/min (A) (by C-G formula based on SCr of 1.39 mg/dL (H)).   Medical History: Past Medical History:  Diagnosis Date   AICD (automatic cardioverter/defibrillator) present    s/p gen change 04/2015 w/ MDT [name] Crt-D  DTBA1D1, Serial number [id number]   Allergic rhinitis    [name]   Anemia    Anxiety    Arthritis    Atrial flutter ([place])    A.  Status post cardioversion; B.  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 Lucky Cowboy)   Chronic lung disease    spirometry 2015 - no obstruction, + mild restrictive lung disease   Chronic sinusitis    Chronic systolic congestive heart failure ([place])    Dyspnea    with exertion   Extrinsic asthma    [name]   Fall with injury 05/28/2017   GERD (gastroesophageal reflux disease) 2010   h/o esophageal stricture with dilation, LA grade C reflux esophagitis by EGD 2010   Goiter    History of diverticulitis of colon 06/2020   History of GI bleed    Secondary to hemorrhoids   History of seasonal allergies    Hypertension    IBS (irritable bowel syndrome)    LBBB (left bundle branch block)    Multiple pulmonary nodules 06/2013   RUL ([facility] Adam Phenix [facility]) - ?vasculitis as of last CT at [facility]   NICM (nonischemic cardiomyopathy) [facility])    Cardiac catheterization March 2006 without coronary disease; EF 25% 2018 Jan   Orthopnea    Porphyria [facility])    Presence of permanent cardiac pacemaker    Sinus congestion 09/25/2010    Medications:  Warfarin PTA: 5 mg Monday, Friday 2.5 mg all other days TWD = 22.5  mg   Assessment: 87 y.o. male with PMH of atrial flutter on chronic warfarin therapy of outpatient regimen outline above. Presented with SOB with associated leg swelling. INR 2.1 (therapeutic) on admission.  DD Interactions: Patient started on 10 day course of cefdinir 300 mg BID 12/03/20 PTA prednisone (10 mg daily) temporarily increased to 10 mg BID x5 days on 12/03/20 for sinusitis   Date INR Warfarin Dose  8/27 -- 2.5 mg (PTA) 8/28 2.1 No dose given 8/29 1.9 5 mg 8/30 1.7 5 mg 8/31 1.6 5 mg 9/01 1.9 2.5 mg  Goal of Therapy:  INR 2-3 (Per pt Pulmonologist wants INR around 2.0) Monitor platelets by anticoagulation protocol: Yes   Plan:  INR 1.9 (subtherapeutic) following missing dose  8/28 Will give 2.5 mg x 1 - back to regular schedule tomorrow Anticipate INR to be therapeutic tomorrow INR daily    Lu Duffel, PharmD, BCPS Clinical Pharmacist   12/08/2020,7:22 AM

## 2020-12-08 NOTE — Progress Notes (Signed)
Chaplain Maggie met patient's wife, Vickii Chafe, in waiting area bench between [place] and [address]. Introductory conversation and hospitality was offered. Continued support available per on call chaplain.

## 2020-12-08 NOTE — Progress Notes (Addendum)
Progress Note    CHADE GOTCHER  [id number] DOB: April 01, 1932  DOA: 12/05/2020 PCP: Ria Bush, MD      Brief Narrative:    Medical records reviewed and are as summarized below:  [name] is a 87 y.o. male with medical history significant of sCHF with EF of 20-25%, hypertension, asthma, GERD, gout, anxiety, AICD placement, left bundle blockage, porphyruria, IBS, GI bleeding, diverticulitis, BPH, atrial fibrillation on Coumadin, anemia, aortic valve regurgitation, esophageal stricture, pulmonary hypertension, thrombocytopenia, CKD-3A, recent sinus infection.  He presented to the hospital with increasing shortness of breath, 5 pound weight gain and decreased oxygen saturation in the 70s.  He was admitted to the hospital for acute exacerbation of chronic systolic CHF and acute hypoxic respiratory failure.    Assessment/Plan:   Principal Problem:   Acute on chronic systolic CHF (congestive heart failure) (HCC) Active Problems:   Anxiety   BPH (benign prostatic hyperplasia)   Thrombocytopenia (HCC)   Chronic atrial fibrillation (HCC)   HTN (hypertension)   Asthma   Gout   Acute on chronic respiratory failure with hypoxia (HCC)   CKD (chronic kidney disease), stage IIIa   Hyponatremia   Hyperkalemia   Sinus infection    Body mass index is 24.34 kg/m.   Acute exacerbation of chronic systolic CHF, peripheral edema: Continue IV Lasix.  Metolazone has been added for adequate diuresis.  Monitor BMP daily weight.  2D echo on 12/07/2018 showed EF estimated at 20 to 25% (unchanged from 2D echo in March 2022).    Acute hypoxic respiratory failure: Improved  Hypotension: BP is better.  Permanent atrial fibrillation: Continue Coumadin and monitor INR.  Hypokalemia: Replete potassium and monitor levels  AKI on CKD stage IIIa: Creatinine has improved to baseline.  Chronic hyponatremia: Asymptomatic.  Recent sinus infection: Completed  Omnicef  Immunocompromised state: He is on chronic prednisone therapy.  Other comorbidities include anxiety, BPH, chronic thrombocytopenia, asthma, gout   Diet Order             Diet regular Room service appropriate? Yes; Fluid consistency: Thin; Fluid restriction: 1200 mL Fluid  Diet effective now                      Consultants: None  Procedures: None    Medications:    allopurinol  100 mg Oral Daily   azelastine  1 spray Each Nare BID   carvedilol  3.125 mg Oral BID WC   fluticasone  1 spray Each Nare Daily   furosemide  80 mg Intravenous Q12H   [START ON 12/09/2020] metolazone  2.5 mg Oral Daily   montelukast  10 mg Oral QHS   polyethylene glycol  17 g Oral Daily   predniSONE  5 mg Oral BID   vitamin B-12  1,000 mcg Oral Daily   Warfarin - Pharmacist Dosing Inpatient   Does not apply q1600   Continuous Infusions:   Anti-infectives (From admission, onward)    Start     Dose/Rate Route Frequency Ordered Stop   12/08/20 1000  cefdinir (OMNICEF) capsule 300 mg  Status:  Discontinued        300 mg Oral Daily 12/07/20 0824 12/07/20 1156   12/06/20 1000  cefdinir (OMNICEF) capsule 300 mg  Status:  Discontinued        300 mg Oral Every 12 hours 12/06/20 0715 12/07/20 0824   12/05/20 2200  cefdinir (OMNICEF) capsule 300 mg  Status:  Discontinued  300 mg Oral Every 24 hours 12/05/20 1903 12/06/20 0715              Family Communication/Anticipated D/C date and plan/Code Status   DVT prophylaxis: Place TED hose Start: 12/07/20 1200     Code Status: DNR  Family Communication: Son at the bedside Disposition Plan:    Status is: Inpatient  Remains inpatient appropriate because:IV treatments appropriate due to intensity of illness or inability to take PO  Dispo: The patient is from: Home              Anticipated d/c is to: Home              Patient currently is not medically stable to d/c.   Difficult to place patient  No           Subjective:   C/o anxiety, shortness of breath and nausea.  He requested that Xanax be increased to twice daily as needed.  His wife and son were present at the bedside  Objective:    Vitals:   12/07/20 2008 12/08/20 0408 12/08/20 0834 12/08/20 0914  BP: 108/68 107/69 112/74   Pulse: 60 (!) 57 (!) 58   Resp: '18 20 17   '$ Temp: 97.9 F (36.6 C) 97.6 F (36.4 C) (!) 97.4 F (36.3 C)   TempSrc:      SpO2: 95% 94% 97%   Weight:  65.5 kg  64.3 kg  Height:       No data found.   Intake/Output Summary (Last 24 hours) at 12/08/2020 1159 Last data filed at 12/08/2020 0909 Gross per 24 hour  Intake 960 ml  Output 1800 ml  Net -840 ml   Filed Weights   12/07/20 0325 12/08/20 0408 12/08/20 0914  Weight: 63.8 kg 65.5 kg 64.3 kg    Exam:  GEN: NAD SKIN: No rash EYES: EOMI ENT: MMM CV: RRR PULM: CTA B ABD: soft, abdominal distention is improving, NT, +BS CNS: AAO x 3, non focal EXT: Bilateral leg and pedal edema (3+) slowly improving.  No erythema or tenderness       Data Reviewed:   I have personally reviewed following labs and imaging studies:  Labs: Labs show the following:   Basic Metabolic Panel: Recent Labs  Lab 12/04/20 1956 12/05/20 0838 12/06/20 0401 12/07/20 0412 12/08/20 0536  NA 128* 128* 131* 131* 132*  K 5.5* 4.3 3.4* 4.0 3.4*  CL 92* 94* 99 97* 96*  CO2 '25 26 26 25 28  '$ GLUCOSE 125* 108* 94 120* 115*  BUN 38* 43* 37* 36* 36*  CREATININE 1.66* 1.72* 1.43* 1.54* 1.39*  CALCIUM 9.1 8.8* 8.3* 8.6* 8.5*  MG  --   --  2.3  --   --    GFR Estimated Creatinine Clearance: 31.4 mL/min (A) (by C-G formula based on SCr of 1.39 mg/dL (H)). Liver Function Tests: No results for input(s): AST, ALT, ALKPHOS, BILITOT, PROT, ALBUMIN in the last 168 hours. No results for input(s): LIPASE, AMYLASE in the last 168 hours. No results for input(s): AMMONIA in the last 168 hours. Coagulation profile Recent Labs  Lab 12/04/20 1956  12/05/20 1457 12/06/20 0401 12/07/20 0412 12/08/20 0536  INR 2.1* 1.9* 1.7* 1.6* 1.9*    CBC: Recent Labs  Lab 12/04/20 1956  WBC 8.8  HGB 12.9*  HCT 37.2*  MCV 89.0  PLT 110*   Cardiac Enzymes: No results for input(s): CKTOTAL, CKMB, CKMBINDEX, TROPONINI in the last 168 hours. BNP (last 3 results)  Recent Labs    09/01/20 1155  PROBNP 22,792*   CBG: No results for input(s): GLUCAP in the last 168 hours. D-Dimer: No results for input(s): DDIMER in the last 72 hours. Hgb A1c: No results for input(s): HGBA1C in the last 72 hours. Lipid Profile: No results for input(s): CHOL, HDL, LDLCALC, TRIG, CHOLHDL, LDLDIRECT in the last 72 hours. Thyroid function studies: No results for input(s): TSH, T4TOTAL, T3FREE, THYROIDAB in the last 72 hours.  Invalid input(s): FREET3 Anemia work up: No results for input(s): VITAMINB12, FOLATE, FERRITIN, TIBC, IRON, RETICCTPCT in the last 72 hours. Sepsis Labs: Recent Labs  Lab 12/04/20 1956  WBC 8.8    Microbiology Recent Results (from the past 240 hour(s))  Resp Panel by RT-PCR (Flu A&B, Covid) Nasopharyngeal Swab     Status: None   Collection Time: 12/05/20  4:44 AM   Specimen: Nasopharyngeal Swab; Nasopharyngeal(NP) swabs in vial transport medium  Result Value Ref Range Status   SARS Coronavirus 2 by RT PCR NEGATIVE NEGATIVE Final    Comment: (NOTE) SARS-CoV-2 target nucleic acids are NOT DETECTED.  The SARS-CoV-2 RNA is generally detectable in upper respiratory specimens during the acute phase of infection. The lowest concentration of SARS-CoV-2 viral copies this assay can detect is 138 copies/mL. A negative result does not preclude SARS-Cov-2 infection and should not be used as the sole basis for treatment or other patient management decisions. A negative result may occur with  improper specimen collection/handling, submission of specimen other than nasopharyngeal swab, presence of viral mutation(s) within the areas  targeted by this assay, and inadequate number of viral copies(<138 copies/mL). A negative result must be combined with clinical observations, patient history, and epidemiological information. The expected result is Negative.  Fact Sheet for Patients:  EntrepreneurPulse.com.au  Fact Sheet for Healthcare Providers:  IncredibleEmployment.be  This test is no t yet approved or cleared by the [place] FDA and  has been authorized for detection and/or diagnosis of SARS-CoV-2 by FDA under an Emergency Use Authorization (EUA). This EUA will remain  in effect (meaning this test can be used) for the duration of the COVID-19 declaration under Section 564(b)(1) of the Act, 21 U.S.C.section 360bbb-3(b)(1), unless the authorization is terminated  or revoked sooner.       Influenza A by PCR NEGATIVE NEGATIVE Final   Influenza B by PCR NEGATIVE NEGATIVE Final    Comment: (NOTE) The Xpert Xpress SARS-CoV-2/FLU/RSV plus assay is intended as an aid in the diagnosis of influenza from Nasopharyngeal swab specimens and should not be used as a sole basis for treatment. Nasal washings and aspirates are unacceptable for Xpert Xpress SARS-CoV-2/FLU/RSV testing.  Fact Sheet for Patients: EntrepreneurPulse.com.au  Fact Sheet for Healthcare Providers: IncredibleEmployment.be  This test is not yet approved or cleared by the [place] FDA and has been authorized for detection and/or diagnosis of SARS-CoV-2 by FDA under an Emergency Use Authorization (EUA). This EUA will remain in effect (meaning this test can be used) for the duration of the COVID-19 declaration under Section 564(b)(1) of the Act, 21 U.S.C. section 360bbb-3(b)(1), unless the authorization is terminated or revoked.  Performed at [facility], [address] [place], [place] [address]     Procedures and diagnostic studies:  No results  found.             LOS: 3 days   Miria Cappelli  [facility]   Pager on [email or web address]. If 7PM-7AM, please contact night-coverage at [email or web address]     12/08/2020, 11:59  AM

## 2020-12-09 LAB — BASIC METABOLIC PANEL
Anion gap: 9 (ref 5–15)
BUN: 36 mg/dL — ABNORMAL HIGH (ref 8–23)
CO2: 28 mmol/L (ref 22–32)
Calcium: 8.7 mg/dL — ABNORMAL LOW (ref 8.9–10.3)
Chloride: 95 mmol/L — ABNORMAL LOW (ref 98–111)
Creatinine, Ser: 1.53 mg/dL — ABNORMAL HIGH (ref 0.61–1.24)
GFR, Estimated: 44 mL/min — ABNORMAL LOW (ref >60)
Glucose, Bld: 113 mg/dL — ABNORMAL HIGH (ref 70–99)
Potassium: 3.4 mmol/L — ABNORMAL LOW (ref 3.5–5.1)
Sodium: 132 mmol/L — ABNORMAL LOW (ref 135–145)

## 2020-12-09 LAB — CBC
HCT: 33.8 % — ABNORMAL LOW (ref 39.0–52.0)
Hemoglobin: 11.7 g/dL — ABNORMAL LOW (ref 13.0–17.0)
MCH: 30.6 pg (ref 26.0–34.0)
MCHC: 34.6 g/dL (ref 30.0–36.0)
MCV: 88.5 fL (ref 80.0–100.0)
Platelets: 107 10*3/uL — ABNORMAL LOW (ref 150–400)
RBC: 3.82 MIL/uL — ABNORMAL LOW (ref 4.22–5.81)
RDW: 18.1 % — ABNORMAL HIGH (ref 11.5–15.5)
WBC: 7.3 10*3/uL (ref 4.0–10.5)
nRBC: 0 % (ref 0.0–0.2)

## 2020-12-09 LAB — PROTIME-INR
INR: 2.2 — ABNORMAL HIGH (ref 0.8–1.2)
Prothrombin Time: 24.4 seconds — ABNORMAL HIGH (ref 11.4–15.2)

## 2020-12-09 LAB — MAGNESIUM: Magnesium: 2.3 mg/dL (ref 1.7–2.4)

## 2020-12-09 MED ORDER — TORSEMIDE 20 MG PO TABS
60.0000 mg | ORAL_TABLET | Freq: Two times a day (BID) | ORAL | Status: DC
  Administered 2020-12-10: 60 mg via ORAL
  Filled 2020-12-09: qty 3

## 2020-12-09 MED ORDER — WARFARIN SODIUM 5 MG PO TABS
5.0000 mg | ORAL_TABLET | Freq: Once | ORAL | Status: AC
  Administered 2020-12-09: 5 mg via ORAL
  Filled 2020-12-09: qty 1

## 2020-12-09 MED ORDER — ALPRAZOLAM 0.25 MG PO TABS
0.1250 mg | ORAL_TABLET | Freq: Two times a day (BID) | ORAL | Status: DC | PRN
  Administered 2020-12-09: 0.125 mg via ORAL
  Filled 2020-12-09: qty 1

## 2020-12-09 MED ORDER — ALBUTEROL SULFATE (2.5 MG/3ML) 0.083% IN NEBU: 3.0000 mL | INHALATION_SOLUTION | Freq: Four times a day (QID) | RESPIRATORY_TRACT | Status: DC | PRN

## 2020-12-09 MED ORDER — PREDNISONE 10 MG PO TABS
5.0000 mg | ORAL_TABLET | Freq: Once | ORAL | Status: AC
  Administered 2020-12-09: 5 mg via ORAL
  Filled 2020-12-09: qty 1

## 2020-12-09 MED ORDER — POTASSIUM CHLORIDE CRYS ER 20 MEQ PO TBCR
40.0000 meq | EXTENDED_RELEASE_TABLET | Freq: Two times a day (BID) | ORAL | Status: DC
  Administered 2020-12-09 – 2020-12-10 (×3): 40 meq via ORAL
  Filled 2020-12-09 (×2): qty 4
  Filled 2020-12-09: qty 2

## 2020-12-09 MED ORDER — MELATONIN 5 MG PO TABS
2.5000 mg | ORAL_TABLET | Freq: Every day | ORAL | Status: DC
  Administered 2020-12-09: 2.5 mg via ORAL
  Filled 2020-12-09: qty 1

## 2020-12-09 MED ORDER — PREDNISONE 10 MG PO TABS
10.0000 mg | ORAL_TABLET | Freq: Every day | ORAL | Status: DC
  Administered 2020-12-10: 10 mg via ORAL
  Filled 2020-12-09: qty 1

## 2020-12-09 NOTE — Progress Notes (Signed)
87 [name]  [id number] DOB: 04-Apr-1932 DOA: 12/05/2020 PCP: Ria Bush, MD    Brief Narrative:  87 year old with a history of systolic CHF (EF 0000000), HTN, asthma, GERD, gout, anxiety disorder, LBBB, status post AICD, IBS, diverticulitis, BPH, and chronic atrial fibrillation on warfarin therapy who presented to the hospital with worsening shortness of breath and a 5 pound weight gain accompanied by decreasing oxygen saturations in the low 70s.  Consultants:  Cardiology  Code Status: NO CODE BLUE  Antimicrobials:  Cefdinir 8/29 > 8/31  DVT prophylaxis: Warfarin  Subjective: Vital signs stable.  Afebrile.  Saturation 97% on room air.  Reports that he is feeling better overall.  Is anxious to go home soon as possible.  Denies chest pain nausea vomiting or abdominal pain.  Assessment & Plan:  Acute exacerbation chronic systolic CHF baseline weight 132-135 pounds (~60kg) - repeat TTE 12/06/2020 notes stable EF of 20-25% - net negative approximately 3100 cc since admission -clinically improving -ambulate -metolazone has been added to his regimen -hypotension has complicated his effective treatment - sliding scale diuretic regimen recommended for home by Cardiology:  torsemide 40 twice daily weight 132-1 35 torsemide 60 twice daily for weight 136-139 metolazone 2.5 up to 5 mg with torsemide 60 twice daily for weight 140 or >  Filed Weights   12/08/20 0408 12/08/20 0914 12/09/20 0517  Weight: 65.5 kg 64.3 kg 64.2 kg    Acute hypoxic respiratory failure - pulmonary edema Due to above  Hypotension Complicating titration of diuretics -blood pressure stable today  Permanent atrial fibrillation Rate controlled -continue chronic Coumadin therapy -INR at goal  Hypokalemia Due to diuretic use -supplement and follow  Acute kidney injury on CKD stage III AAA Creatinine presently at baseline -peaked at 1.72 during this hospital stay -baseline appears to be  approximately 1.45  Recent Labs  Lab 12/05/20 0838 12/06/20 0401 12/07/20 0412 12/08/20 0536 12/09/20 0441  CREATININE 1.72* 1.43* 1.54* 1.39* 1.53*     Chronic hyponatremia Likely due to CHF itself -sodium stable  Normocytic anemia Check anemia panel  Immunocompromise state Patient is on chronic steroid therapy due to gout     Family Communication: Spoke with wife and son at bedside Status is: Inpatient  Remains inpatient appropriate because:Inpatient level of care appropriate due to severity of illness  Dispo: The patient is from: Home              Anticipated d/c is to: Home              Patient currently is not medically stable to d/c.   Difficult to place patient No         Objective: Blood pressure 108/62, pulse 69, temperature 97.6 F (36.4 C), temperature source Oral, resp. rate 17, height '5\' 4"'$  (1.626 m), weight 64.2 kg, SpO2 97 %.  Intake/Output Summary (Last 24 hours) at 12/09/2020 0906 Last data filed at 12/09/2020 0809 Gross per 24 hour  Intake 960 ml  Output 1775 ml  Net -815 ml   Filed Weights   12/08/20 0408 12/08/20 0914 12/09/20 0517  Weight: 65.5 kg 64.3 kg 64.2 kg    Examination: General: No acute respiratory distress Lungs: Clear to auscultation bilaterally without wheezes or crackles Cardiovascular: Irregularly irregular with rate controlled with no murmur or rub Abdomen: Nontender, nondistended, soft, bowel sounds positive, no rebound, no ascites, no appreciable mass Extremities: 1+ bilateral lower extremity edema  CBC: Recent Labs  Lab 12/04/20 1956 12/09/20 0441  WBC 8.8 7.3  HGB 12.9* 11.7*  HCT 37.2* 33.8*  MCV 89.0 88.5  PLT 110* [id number]*   Basic Metabolic Panel: Recent Labs  Lab 12/06/20 0401 12/07/20 0412 12/08/20 0536 12/09/20 0441  NA 131* 131* 132* 132*  K 3.4* 4.0 3.4* 3.4*  CL 99 97* 96* 95*  CO2 '26 25 28 28  '$ GLUCOSE 94 120* 115* 113*  BUN 37* 36* 36* 36*  CREATININE 1.43* 1.54* 1.39* 1.53*  CALCIUM  8.3* 8.6* 8.5* 8.7*  MG 2.3  --   --  2.3   GFR: Estimated Creatinine Clearance: 28.5 mL/min (A) (by C-G formula based on SCr of 1.53 mg/dL (H)).    Coagulation Profile: Recent Labs  Lab 12/05/20 1457 12/06/20 0401 12/07/20 0412 12/08/20 0536 12/09/20 0441  INR 1.9* 1.7* 1.6* 1.9* 2.2*     HbA1C: Hgb A1c MFr Bld  Date/Time Value Ref Range Status  11/16/2019 03:58 PM 6.1 4.6 - 6.5 % Final    Comment:    Glycemic Control Guidelines for People with Diabetes:Non Diabetic:  <6%Goal of Therapy: <7%Additional Action Suggested:  >8%   11/06/2018 12:06 PM 6.2 4.6 - 6.5 % Final    Comment:    Glycemic Control Guidelines for People with Diabetes:Non Diabetic:  <6%Goal of Therapy: <7%Additional Action Suggested:  >8%     Recent Results (from the past 240 hour(s))  Resp Panel by RT-PCR (Flu A&B, Covid) Nasopharyngeal Swab     Status: None   Collection Time: 12/05/20  4:44 AM   Specimen: Nasopharyngeal Swab; Nasopharyngeal(NP) swabs in vial transport medium  Result Value Ref Range Status   SARS Coronavirus 2 by RT PCR NEGATIVE NEGATIVE Final    Comment: (NOTE) SARS-CoV-2 target nucleic acids are NOT DETECTED.  The SARS-CoV-2 RNA is generally detectable in upper respiratory specimens during the acute phase of infection. The lowest concentration of SARS-CoV-2 viral copies this assay can detect is 138 copies/mL. A negative result does not preclude SARS-Cov-2 infection and should not be used as the sole basis for treatment or other patient management decisions. A negative result may occur with  improper specimen collection/handling, submission of specimen other than nasopharyngeal swab, presence of viral mutation(s) within the areas targeted by this assay, and inadequate number of viral copies(<138 copies/mL). A negative result must be combined with clinical observations, patient history, and epidemiological information. The expected result is Negative.  Fact Sheet for Patients:   EntrepreneurPulse.com.au  Fact Sheet for Healthcare Providers:  IncredibleEmployment.be  This test is no t yet approved or cleared by the [place] FDA and  has been authorized for detection and/or diagnosis of SARS-CoV-2 by FDA under an Emergency Use Authorization (EUA). This EUA will remain  in effect (meaning this test can be used) for the duration of the COVID-19 declaration under Section 564(b)(1) of the Act, 21 U.S.C.section 360bbb-3(b)(1), unless the authorization is terminated  or revoked sooner.       Influenza A by PCR NEGATIVE NEGATIVE Final   Influenza B by PCR NEGATIVE NEGATIVE Final    Comment: (NOTE) The Xpert Xpress SARS-CoV-2/FLU/RSV plus assay is intended as an aid in the diagnosis of influenza from Nasopharyngeal swab specimens and should not be used as a sole basis for treatment. Nasal washings and aspirates are unacceptable for Xpert Xpress SARS-CoV-2/FLU/RSV testing.  Fact Sheet for Patients: EntrepreneurPulse.com.au  Fact Sheet for Healthcare Providers: IncredibleEmployment.be  This test is not yet approved or cleared by the [place] FDA and has been authorized for detection and/or diagnosis of SARS-CoV-2 by FDA  under an Emergency Use Authorization (EUA). This EUA will remain in effect (meaning this test can be used) for the duration of the COVID-19 declaration under Section 564(b)(1) of the Act, 21 U.S.C. section 360bbb-3(b)(1), unless the authorization is terminated or revoked.  Performed at [facility], [address] [place], [place] [address]      Scheduled Meds:  allopurinol  100 mg Oral Daily   azelastine  1 spray Each Nare BID   carvedilol  3.125 mg Oral BID WC   fluticasone  1 spray Each Nare Daily   furosemide  80 mg Intravenous Q12H   metolazone  2.5 mg Oral Daily   montelukast  10 mg Oral QHS   polyethylene glycol  17 g Oral Daily    predniSONE  5 mg Oral BID   vitamin B-12  1,000 mcg Oral Daily   Warfarin - Pharmacist Dosing Inpatient   Does not apply q1600      LOS: 4 days   Cherene Altes, MD [facility] Office  [phone] Pager - Text Page per Shea Evans  If 7PM-7AM, please contact night-coverage per Amion 12/09/2020, 9:06 AM

## 2020-12-09 NOTE — Consult Note (Signed)
[place] [facility] for warfarin Indication: atrial flutter   Allergies  Allergen Reactions   Carafate [Sucralfate] Other (See Comments)    Burns stomach   Clarithromycin Nausea Only   Famotidine Other (See Comments)    ABD. PAIN   Levaquin [Levofloxacin] Other (See Comments)    Stomach pain, has to eat a lot of food   Omeprazole Diarrhea   Oxytetracycline Other (See Comments)    BUMPS   Penicillins Swelling    Amoxicillin ok, Pt states he had to have a shot to reverse the reaction Has patient had a PCN reaction causing immediate rash, facial/tongue/throat swelling, SOB or lightheadedness with hypotension: Yes Has patient had a PCN reaction causing severe rash involving mucus membranes or skin necrosis: No Has patient had a PCN reaction that required hospitalization No Has patient had a PCN reaction occurring within the last 10 years: Yes If all of the above answers are "NO", then may proceed with   Proton Pump Inhibitors Other (See Comments)    GI upset, diarrhea, gas, bloating   Ranitidine Diarrhea   Doxycycline Rash    Patient Measurements: Height: '5\' 4"'$  (162.6 cm) Weight: 64.2 kg (141 lb 8.6 oz) IBW/kg (Calculated) : 59.2  Vital Signs: Temp: 97.5 F (36.4 C) (09/02 0517) Temp Source: Oral (09/02 0517) BP: 108/55 (09/02 0517) Pulse Rate: 67 (09/02 0517)  Labs: Recent Labs    12/07/20 0412 12/08/20 0536 12/09/20 0441  HGB  --   --  11.7*  HCT  --   --  33.8*  PLT  --   --  107*  LABPROT 19.3* 21.3* 24.4*  INR 1.6* 1.9* 2.2*  CREATININE 1.54* 1.39* 1.53*     Estimated Creatinine Clearance: 28.5 mL/min (A) (by C-G formula based on SCr of 1.53 mg/dL (H)).   Medical History: Past Medical History:  Diagnosis Date   AICD (automatic cardioverter/defibrillator) present    s/p gen change 04/2015 w/ MDT [name] Crt-D  DTBA1D1, Serial number [id number]   Allergic rhinitis    [name]   Anemia    Anxiety    Arthritis    Atrial  flutter ([place])    A.  Status post cardioversion; B.  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 Lucky Cowboy)   Chronic lung disease    spirometry 2015 - no obstruction, + mild restrictive lung disease   Chronic sinusitis    Chronic systolic congestive heart failure ([place])    Dyspnea    with exertion   Extrinsic asthma    [name]   Fall with injury 05/28/2017   GERD (gastroesophageal reflux disease) 2010   h/o esophageal stricture with dilation, LA grade C reflux esophagitis by EGD 2010   Goiter    History of diverticulitis of colon 06/2020   History of GI bleed    Secondary to hemorrhoids   History of seasonal allergies    Hypertension    IBS (irritable bowel syndrome)    LBBB (left bundle branch block)    Multiple pulmonary nodules 06/2013   RUL (Dr. Adam Phenix at [facility]) - ?vasculitis as of last CT at [facility]   NICM (nonischemic cardiomyopathy) [facility])    Cardiac catheterization March 2006 without coronary disease; EF 25% 2018 Jan   Orthopnea    [facility])  Presence of permanent cardiac pacemaker    Sinus congestion 09/25/2010    Medications:  Warfarin PTA: 5 mg Monday, Friday 2.5 mg all other days TWD = 22.5 mg   Assessment: 87 y.o. male with PMH of atrial flutter on chronic warfarin therapy of outpatient regimen outline above. Presented with SOB with associated leg swelling. INR 2.1 (therapeutic) on admission.  DD Interactions: Patient started on 10 day course of cefdinir 300 mg BID 12/03/20, stopped by MD 9/1 PTA prednisone (10 mg daily) temporarily increased to 10 mg BID x5 days on 12/03/20 for sinusitis, but has returned to '10mg'$  daily   Date INR Warfarin Dose  8/27 -- 2.5 mg (PTA) 8/28 2.1 No dose given 8/29 1.9 5 mg 8/30 1.7 5 mg 8/31 1.6 5 mg 9/01 1.9 2.5 mg 09/02 2.2 '5mg'$   Goal of Therapy:   INR 2-3 (Per pt Pulmonologist wants INR around 2.0) Monitor platelets by anticoagulation protocol: Yes   Plan:  INR 2.2 (therapeutic) following missing dose 8/28 Will give 5 mg x 1 - based on regular home regimen Continue to monitor INR daily and make adjustments as needed   Narda Rutherford, PharmD Pharmacy Resident  12/09/2020 9:58 AM

## 2020-12-10 DIAGNOSIS — I5023 Acute on chronic systolic (congestive) heart failure: Secondary | ICD-10-CM | POA: Diagnosis not present

## 2020-12-10 LAB — BASIC METABOLIC PANEL
Anion gap: 7 (ref 5–15)
BUN: 40 mg/dL — ABNORMAL HIGH (ref 8–23)
CO2: 32 mmol/L (ref 22–32)
Calcium: 8.8 mg/dL — ABNORMAL LOW (ref 8.9–10.3)
Chloride: 97 mmol/L — ABNORMAL LOW (ref 98–111)
Creatinine, Ser: 1.71 mg/dL — ABNORMAL HIGH (ref 0.61–1.24)
GFR, Estimated: 38 mL/min — ABNORMAL LOW (ref >60)
Glucose, Bld: 94 mg/dL (ref 70–99)
Potassium: 3.9 mmol/L (ref 3.5–5.1)
Sodium: 136 mmol/L (ref 135–145)

## 2020-12-10 LAB — FERRITIN: Ferritin: 48 ng/mL (ref 24–336)

## 2020-12-10 LAB — CBC
HCT: 35.3 % — ABNORMAL LOW (ref 39.0–52.0)
Hemoglobin: 12.1 g/dL — ABNORMAL LOW (ref 13.0–17.0)
MCH: 30.1 pg (ref 26.0–34.0)
MCHC: 34.3 g/dL (ref 30.0–36.0)
MCV: 87.8 fL (ref 80.0–100.0)
Platelets: 112 10*3/uL — ABNORMAL LOW (ref 150–400)
RBC: 4.02 MIL/uL — ABNORMAL LOW (ref 4.22–5.81)
RDW: 18.2 % — ABNORMAL HIGH (ref 11.5–15.5)
WBC: 6.9 10*3/uL (ref 4.0–10.5)
nRBC: 0 % (ref 0.0–0.2)

## 2020-12-10 LAB — IRON AND TIBC
Iron: 49 ug/dL (ref 45–182)
Saturation Ratios: 16 % — ABNORMAL LOW (ref 17.9–39.5)
TIBC: 300 ug/dL (ref 250–450)
UIBC: 251 ug/dL

## 2020-12-10 LAB — PROTIME-INR
INR: 2 — ABNORMAL HIGH (ref 0.8–1.2)
Prothrombin Time: 22.7 seconds — ABNORMAL HIGH (ref 11.4–15.2)

## 2020-12-10 LAB — FOLATE: Folate: 15.6 ng/mL (ref >5.9)

## 2020-12-10 LAB — RETICULOCYTES
Immature Retic Fract: 20.2 % — ABNORMAL HIGH (ref 2.3–15.9)
RBC.: 3.88 MIL/uL — ABNORMAL LOW (ref 4.22–5.81)
Retic Count, Absolute: 95.4 10*3/uL (ref 19.0–186.0)
Retic Ct Pct: 2.5 % (ref 0.4–3.1)

## 2020-12-10 LAB — VITAMIN B12: Vitamin B-12: 1396 pg/mL — ABNORMAL HIGH (ref 180–914)

## 2020-12-10 MED ORDER — PREDNISONE 5 MG PO TABS: 10.0000 mg | ORAL_TABLET | Freq: Every day | ORAL | Status: AC

## 2020-12-10 MED ORDER — TORSEMIDE 40 MG PO TABS: ORAL_TABLET | ORAL | 1 refills | Status: DC

## 2020-12-10 MED ORDER — METOLAZONE 2.5 MG PO TABS: ORAL_TABLET | ORAL | 1 refills | Status: AC

## 2020-12-10 MED ORDER — WARFARIN SODIUM 2.5 MG PO TABS
2.5000 mg | ORAL_TABLET | Freq: Once | ORAL | Status: DC
  Filled 2020-12-10: qty 1

## 2020-12-10 MED ORDER — POTASSIUM CHLORIDE CRYS ER 20 MEQ PO TBCR: 40.0000 meq | EXTENDED_RELEASE_TABLET | Freq: Two times a day (BID) | ORAL | 1 refills | Status: AC

## 2020-12-10 MED ORDER — CARVEDILOL 3.125 MG PO TABS: 3.1250 mg | ORAL_TABLET | Freq: Two times a day (BID) | ORAL | 1 refills | Status: AC

## 2020-12-10 NOTE — Progress Notes (Signed)
Discharge instructions. RX's and follow up appts explained and provided to patient , wife and son verbalized understanding. Patient left floor via wheelchair accompanied by volunteers, no c/o pain or shortness of breath at d/c.   Shalaunda Weatherholtz, Tivis Ringer, RN

## 2020-12-10 NOTE — Discharge Summary (Signed)
DISCHARGE SUMMARY  JONE SCHURMAN  MR#: [id number]  DOB:03-12-1932  Date of Admission: 12/05/2020 Date of Discharge: 12/10/2020  Attending Physician:Allyn Bertoni Hennie Duos, MD  Patient's PT:[id number], Garlon Hatchet, MD  Consults: Cardiology   Disposition: D/C home   Follow-up Appts:  Follow-up Information     Ria Bush, MD Follow up in 1 week(s).   Specialty: Family Medicine Contact information: [place] [address] [phone]         [name], MD .   Specialty: Cardiology Contact information: [address] [place] [address] [place] [address] [phone]         Evans Lance, MD .   Specialty: Cardiology Contact information: [phone] [address] [place] [address] [phone]                 Issues Needing Follow-up: -monitor renal function -monitor weight/volume status  -assess BP   Discharge Diagnoses: Acute exacerbation chronic systolic CHF Acute hypoxic respiratory failure - pulmonary edema Hypotension Permanent atrial fibrillation Hypokalemia Acute kidney injury on CKD stage III AAA Chronic hyponatremia Normocytic anemia Immunocompromise state  Initial presentation: 87 year old with a history of systolic CHF (EF 0000000), HTN, asthma, GERD, gout, anxiety disorder, LBBB, status post AICD, IBS, diverticulitis, BPH, and chronic atrial fibrillation on warfarin therapy who presented to the hospital with worsening shortness of breath and a 5 pound weight gain accompanied by decreasing oxygen saturations in the low 70s.  Hospital Course:  Acute exacerbation chronic systolic CHF baseline weight 132-135 pounds (~60kg) - repeat TTE 12/06/2020 noted stable EF of 20-25% - net negative approximately 3700 cc since admission -weight finally beginning to reflect diuresis - clinically improving -ambulated -metolazone has been added to his regimen -hypotension has complicated his effective treatment -  sliding scale diuretic regimen recommended for home by Cardiology:   torsemide 40 twice daily weight 132-1 35 torsemide 60 twice daily for weight 136-139 metolazone 5 mg with torsemide 60 twice daily for weight 140 or >  Acute hypoxic respiratory failure - pulmonary edema Due to above -resolved with patient stable on room air at time of discharge   Hypotension Complicated titration of diuretics -blood pressure stable with systolics in the mid 0000000 and patient tolerating well   Permanent atrial fibrillation Rate controlled -continue chronic Coumadin therapy -INR at goal   Hypokalemia Due to diuretic use -corrected with supplementation   Acute kidney injury on CKD stage III AAA Creatinine continues to wax and wane - peaked at 1.72 during this hospital stay - baseline appears to be approximately 1.45 -will need to be monitored in outpatient setting  Chronic hyponatremia Likely due to CHF itself -sodium has normalized with aggressive diuresis   Normocytic anemia anemia panel unrevealing -no evidence of gross blood loss   Immunocompromise state Patient is on chronic steroid therapy due to gout     Allergies as of 12/10/2020       Reactions   Carafate [sucralfate] Other (See Comments)   Burns stomach   Clarithromycin Nausea Only   Famotidine Other (See Comments)   ABD. PAIN   Levaquin [levofloxacin] Other (See Comments)   Stomach pain, has to eat a lot of food   Omeprazole Diarrhea   Oxytetracycline Other (See Comments)   BUMPS   Penicillins Swelling   Amoxicillin ok, Pt states he had to have a shot to reverse the reaction Has patient had a PCN reaction causing immediate rash, facial/tongue/throat swelling, SOB or lightheadedness with hypotension: Yes Has patient  had a PCN reaction causing severe rash involving mucus membranes or skin necrosis: No Has patient had a PCN reaction that required hospitalization No Has patient had a PCN reaction occurring within the last 10 years:  Yes If all of the above answers are "NO", then may proceed with   Proton Pump Inhibitors Other (See Comments)   GI upset, diarrhea, gas, bloating   Ranitidine Diarrhea   Doxycycline Rash        Medication List     STOP taking these medications    cefdinir 300 MG capsule Commonly known as: OMNICEF       TAKE these medications    acetaminophen 500 MG tablet Commonly known as: TYLENOL Take 250 mg by mouth daily as needed. Pain   allopurinol 100 MG tablet Commonly known as: ZYLOPRIM Take 100 mg by mouth daily. Per patient taking 2 tablets (200 mg)   ALPRAZolam 0.25 MG tablet Commonly known as: XANAX TAKE 1/2 TO 1 TABLET (0.125-0.25 MG TOTAL) BY MOUTH AT BEDTIME AS NEEDED FOR ANXIETY OR SLEEP. What changed: See the new instructions.   alum & mag hydroxide-simeth 200-200-20 MG/5ML suspension Commonly known as: MAALOX/MYLANTA Take 15 mLs by mouth daily as needed for indigestion or heartburn.   Azelastine HCl 137 MCG/SPRAY Soln Place 1 spray into both nostrils 2 (two) times daily.   carvedilol 3.125 MG tablet Commonly known as: COREG Take 1 tablet (3.125 mg total) by mouth 2 (two) times daily with a meal. What changed:  medication strength how much to take when to take this   fluticasone 50 MCG/ACT nasal spray Commonly known as: FLONASE Place 1 spray into both nostrils daily.   metolazone 2.5 MG tablet Commonly known as: ZAROXOLYN metolazone 2.'5mg'$  daily for weight < 140lbs metolazone 5 mg for weight 140lbs or >   montelukast 10 MG tablet Commonly known as: SINGULAIR Take 10 mg by mouth at bedtime.   polyethylene glycol 17 g packet Commonly known as: MIRALAX / GLYCOLAX Take 17 g by mouth daily as needed (constipation).   potassium chloride SA 20 MEQ tablet Commonly known as: KLOR-CON Take 2 tablets (40 mEq total) by mouth 2 (two) times daily. What changed: when to take this   predniSONE 5 MG tablet Commonly known as: DELTASONE Take 2 tablets (10 mg  total) by mouth daily with breakfast. What changed:  how much to take when to take this   tamsulosin 0.4 MG Caps capsule Commonly known as: FLOMAX Take 2 capsules (0.8 mg total) by mouth daily after supper.   Torsemide 40 MG Tabs torsemide 40 twice daily for weight 132 - 135lbs torsemide 60 twice daily for weight 136 - 139lbs torsemide 60 twice daily along with metolazone '5mg'$  daily for weight 140lbs or > What changed:  medication strength how much to take how to take this when to take this additional instructions   vitamin B-12 500 MCG tablet Commonly known as: CYANOCOBALAMIN Take 2 tablets (1,000 mcg total) by mouth daily.   warfarin 5 MG tablet Commonly known as: COUMADIN Take as directed. If you are unsure how to take this medication, talk to your nurse or doctor. Original instructions: Take 2.5 mg by mouth as directed. 5 days weekly. All days except Monday and Friday What changed: Another medication with the same name was changed. Make sure you understand how and when to take each.   warfarin 5 MG tablet Commonly known as: COUMADIN Take as directed. If you are unsure how to take this medication, talk  to your nurse or doctor. Original instructions: TAKE 1/2 TO 1 TABLET BY MOUTH DAILY AS DIRECTED BY COUMADIN CLINIC What changed: See the new instructions.        Day of Discharge BP (!) 97/55 (BP Location: Left Arm)   Pulse 60   Temp 97.6 F (36.4 C)   Resp 16   Ht '5\' 4"'$  (1.626 m)   Wt 62.2 kg   SpO2 92%   BMI 23.54 kg/m   Physical Exam: General: No acute respiratory distress Lungs: Clear to auscultation bilaterally without wheezes or crackles Cardiovascular: Regular rate and rhythm without murmur gallop or rub normal S1 and S2 Abdomen: Nontender, nondistended, soft, bowel sounds positive, no rebound, no ascites, no appreciable mass Extremities: No significant cyanosis, clubbing, or edema bilateral lower extremities  Basic Metabolic Panel: Recent Labs  Lab  12/06/20 0401 12/07/20 0412 12/08/20 0536 12/09/20 0441 12/10/20 0439  NA 131* 131* 132* 132* 136  K 3.4* 4.0 3.4* 3.4* 3.9  CL 99 97* 96* 95* 97*  CO2 '26 25 28 28 '$ 32  GLUCOSE 94 120* 115* 113* 94  BUN 37* 36* 36* 36* 40*  CREATININE 1.43* 1.54* 1.39* 1.53* 1.71*  CALCIUM 8.3* 8.6* 8.5* 8.7* 8.8*  MG 2.3  --   --  2.3  --     Coags: Recent Labs  Lab 12/06/20 0401 12/07/20 0412 12/08/20 0536 12/09/20 0441 12/10/20 0439  INR 1.7* 1.6* 1.9* 2.2* 2.0*    CBC: Recent Labs  Lab 12/04/20 1956 12/09/20 0441 12/10/20 0439  WBC 8.8 7.3 6.9  HGB 12.9* 11.7* 12.1*  HCT 37.2* 33.8* 35.3*  MCV 89.0 88.5 87.8  PLT 110* 107* 112*    BNP (last 3 results) Recent Labs    06/17/20 1555 06/19/20 0600 12/04/20 1956  BNP 2,366.8* 2,034.7* 2,501.7*    ProBNP (last 3 results) Recent Labs    09/01/20 1155  PROBNP 22,792*    Recent Results (from the past 240 hour(s))  Resp Panel by RT-PCR (Flu A&B, Covid) Nasopharyngeal Swab     Status: None   Collection Time: 12/05/20  4:44 AM   Specimen: Nasopharyngeal Swab; Nasopharyngeal(NP) swabs in vial transport medium  Result Value Ref Range Status   SARS Coronavirus 2 by RT PCR NEGATIVE NEGATIVE Final    Comment: (NOTE) SARS-CoV-2 target nucleic acids are NOT DETECTED.  The SARS-CoV-2 RNA is generally detectable in upper respiratory specimens during the acute phase of infection. The lowest concentration of SARS-CoV-2 viral copies this assay can detect is 138 copies/mL. A negative result does not preclude SARS-Cov-2 infection and should not be used as the sole basis for treatment or other patient management decisions. A negative result may occur with  improper specimen collection/handling, submission of specimen other than nasopharyngeal swab, presence of viral mutation(s) within the areas targeted by this assay, and inadequate number of viral copies(<138 copies/mL). A negative result must be combined with clinical  observations, patient history, and epidemiological information. The expected result is Negative.  Fact Sheet for Patients:  EntrepreneurPulse.com.au  Fact Sheet for Healthcare Providers:  IncredibleEmployment.be  This test is no t yet approved or cleared by the [place] FDA and  has been authorized for detection and/or diagnosis of SARS-CoV-2 by FDA under an Emergency Use Authorization (EUA). This EUA will remain  in effect (meaning this test can be used) for the duration of the COVID-19 declaration under Section 564(b)(1) of the Act, 21 U.S.C.section 360bbb-3(b)(1), unless the authorization is terminated  or revoked sooner.  Influenza A by PCR NEGATIVE NEGATIVE Final   Influenza B by PCR NEGATIVE NEGATIVE Final    Comment: (NOTE) The Xpert Xpress SARS-CoV-2/FLU/RSV plus assay is intended as an aid in the diagnosis of influenza from Nasopharyngeal swab specimens and should not be used as a sole basis for treatment. Nasal washings and aspirates are unacceptable for Xpert Xpress SARS-CoV-2/FLU/RSV testing.  Fact Sheet for Patients: EntrepreneurPulse.com.au  Fact Sheet for Healthcare Providers: IncredibleEmployment.be  This test is not yet approved or cleared by the [place] FDA and has been authorized for detection and/or diagnosis of SARS-CoV-2 by FDA under an Emergency Use Authorization (EUA). This EUA will remain in effect (meaning this test can be used) for the duration of the COVID-19 declaration under Section 564(b)(1) of the Act, 21 U.S.C. section 360bbb-3(b)(1), unless the authorization is terminated or revoked.  Performed at [facility], [address] [place], [place] [address]       Time spent in discharge (includes decision making & examination of pt): 35 minutes  12/10/2020, 12:34 PM   Cherene Altes, MD [facility] Office   [phone]

## 2020-12-10 NOTE — Progress Notes (Signed)
Progress Note  Patient Name: [name] Date of Encounter: 12/10/2020  [facility] Cardiologist: [name], MD   Subjective   Shortness of breath is improved, edema is also improved since starting metolazone.  Inpatient Medications    Scheduled Meds:  allopurinol  100 mg Oral Daily   azelastine  1 spray Each Nare BID   carvedilol  3.125 mg Oral BID WC   fluticasone  1 spray Each Nare Daily   melatonin  2.5 mg Oral QHS   metolazone  2.5 mg Oral Daily   montelukast  10 mg Oral QHS   polyethylene glycol  17 g Oral Daily   potassium chloride  40 mEq Oral BID   predniSONE  10 mg Oral Q breakfast   torsemide  60 mg Oral BID   vitamin B-12  1,000 mcg Oral Daily   warfarin  2.5 mg Oral ONCE-1600   Warfarin - Pharmacist Dosing Inpatient   Does not apply q1600   Continuous Infusions:  PRN Meds: acetaminophen, albuterol, ALPRAZolam, alum & mag hydroxide-simeth, dextromethorphan-guaiFENesin, ondansetron (ZOFRAN) IV   Vital Signs    Vitals:   12/10/20 0553 12/10/20 0600 12/10/20 0747 12/10/20 1251  BP: (!) 94/56  (!) 97/55 115/71  Pulse: (!) 56  60 60  Resp: '20  16 18  '$ Temp: 97.7 F (36.5 C)  97.6 F (36.4 C) 97.9 F (36.6 C)  TempSrc: Oral     SpO2: 93%  92% 99%  Weight:  62.2 kg    Height:        Intake/Output Summary (Last 24 hours) at 12/10/2020 1353 Last data filed at 12/10/2020 0900 Gross per 24 hour  Intake 240 ml  Output 750 ml  Net -510 ml   Last 3 Weights 12/10/2020 12/09/2020 12/08/2020  Weight (lbs) 137 lb 2 oz 141 lb 8.6 oz 141 lb 12.8 oz  Weight (kg) 62.2 kg 64.2 kg 64.32 kg      Telemetry    V paced rhythm- Personally Reviewed  ECG     - Personally Reviewed  Physical Exam   GEN: No acute distress, elderly gentleman Neck: No JVD Cardiac: RRR, no murmur  Respiratory: Clear to auscultation bilaterally. GI: Soft, nontender, non-distended  MS: 1+  edema; varicose veins noted Neuro:  Nonfocal  Psych: Normal affect   Labs     High Sensitivity Troponin:   Recent Labs  Lab 12/04/20 1956 12/04/20 2242  TROPONINIHS 4 4      Chemistry Recent Labs  Lab 12/08/20 0536 12/09/20 0441 12/10/20 0439  NA 132* 132* 136  K 3.4* 3.4* 3.9  CL 96* 95* 97*  CO2 28 28 32  GLUCOSE 115* 113* 94  BUN 36* 36* 40*  CREATININE 1.39* 1.53* 1.71*  CALCIUM 8.5* 8.7* 8.8*  GFRNONAA 49* 44* 38*  ANIONGAP '8 9 7     '$ Hematology Recent Labs  Lab 12/04/20 1956 12/09/20 0441 12/10/20 0439  WBC 8.8 7.3 6.9  RBC 4.18* 3.82* 4.02*  3.88*  HGB 12.9* 11.7* 12.1*  HCT 37.2* 33.8* 35.3*  MCV 89.0 88.5 87.8  MCH 30.9 30.6 30.1  MCHC 34.7 34.6 34.3  RDW 17.7* 18.1* 18.2*  PLT 110* 107* 112*    BNP Recent Labs  Lab 12/04/20 1956  BNP 2,501.7*     DDimer No results for input(s): DDIMER in the last 168 hours.   Radiology    No results found.  Cardiac Studies   TTE 12/06/2020 1. Left ventricular ejection fraction, by estimation, is  20 to 25%. The  left ventricle has severely decreased function. The left ventricle  demonstrates global hypokinesis. Left ventricular diastolic parameters are  indeterminate. The average left  ventricular global longitudinal strain is -9.0 %. The global longitudinal  strain is abnormal.   2. Right ventricular systolic function is moderately reduced. The right  ventricular size is normal. There is mildly elevated pulmonary artery  systolic pressure.   3. Left atrial size was severely dilated.   4. Right atrial size was moderately dilated.   5. The mitral valve is abnormal. Mild mitral valve regurgitation.   6. The aortic valve was not well visualized. Aortic valve regurgitation  is mild. No aortic stenosis is present.   7. The inferior vena cava is normal in size with <50% respiratory  variability, suggesting right atrial pressure of 8 mmHg.  Patient Profile     87 y.o. male with history of nonischemic cardiomyopathy, last EF 20 to 25%, s/p AICD, atrial flutter, GERD, pulmonary  hypertension who presents with worsening shortness of breath and weight gain.  Being seen for CHF exacerbation.  Assessment & Plan    Nonischemic cardiomyopathy EF 20 to 25% -Shortness of breath improved, edema improved. -Continue torsemide 60 mg twice daily, metolazone 2.5 mg daily. -Continue Coreg 3.125 mg twice daily. -Check weights, blood pressures frequently at home.  Blood pressures running low, consider decreasing/stopping Coreg.   2.  Permanent A. Fib -Warfarin per pharmacy protocol   3.  S/p AICD -Device seems to be working appropriately.   -Telemetry currently shows V paced rhythm.   Patient can be discharged home today on current medications as prescribed.  Close monitoring of daily weights, advised.  Close follow-up as outpatient with cardiology advised.  Total encounter time 35 minutes  Greater than 50% was spent in counseling and coordination of care with the patient      Signed, Kate Sable, MD  12/10/2020, 1:53 PM

## 2020-12-10 NOTE — Plan of Care (Signed)
  Problem: Education: Goal: Knowledge of General Education information will improve Description: Including pain rating scale, medication(s)/side effects and non-pharmacologic comfort measures 12/10/2020 1349 by Emmaline Life, RN Outcome: Adequate for Discharge 12/10/2020 1241 by Emmaline Life, RN Outcome: Progressing   Problem: Health Behavior/Discharge Planning: Goal: Ability to manage health-related needs will improve Outcome: Adequate for Discharge   Problem: Clinical Measurements: Goal: Ability to maintain clinical measurements within normal limits will improve Outcome: Adequate for Discharge Goal: Will remain free from infection Outcome: Adequate for Discharge Goal: Diagnostic test results will improve Outcome: Adequate for Discharge Goal: Respiratory complications will improve Outcome: Adequate for Discharge Goal: Cardiovascular complication will be avoided Outcome: Adequate for Discharge   Problem: Activity: Goal: Risk for activity intolerance will decrease Outcome: Adequate for Discharge   Problem: Nutrition: Goal: Adequate nutrition will be maintained Outcome: Adequate for Discharge   Problem: Coping: Goal: Level of anxiety will decrease Outcome: Adequate for Discharge   Problem: Elimination: Goal: Will not experience complications related to bowel motility Outcome: Adequate for Discharge Goal: Will not experience complications related to urinary retention Outcome: Adequate for Discharge   Problem: Pain Managment: Goal: General experience of comfort will improve Outcome: Adequate for Discharge   Problem: Safety: Goal: Ability to remain free from injury will improve Outcome: Adequate for Discharge   Problem: Skin Integrity: Goal: Risk for impaired skin integrity will decrease Outcome: Adequate for Discharge   Problem: Education: Goal: Ability to demonstrate management of disease process will improve Outcome: Adequate for Discharge Goal: Ability to  verbalize understanding of medication therapies will improve Outcome: Adequate for Discharge Goal: Individualized Educational Video(s) Outcome: Adequate for Discharge   Problem: Activity: Goal: Capacity to carry out activities will improve Outcome: Adequate for Discharge   Problem: Cardiac: Goal: Ability to achieve and maintain adequate cardiopulmonary perfusion will improve Outcome: Adequate for Discharge

## 2020-12-10 NOTE — Plan of Care (Signed)
  Problem: Education: Goal: Knowledge of General Education information will improve Description Including pain rating scale, medication(s)/side effects and non-pharmacologic comfort measures Outcome: Progressing   

## 2020-12-10 NOTE — Progress Notes (Addendum)
OT Cancellation Note  Patient Details Name: [name] MRN: [id number] DOB: [birth year]   Cancelled Treatment:    Reason Eval/Treat Not Completed: Patient declined, no reason specified Pt., wife, and son present upon arrival and refused OT intervention. Family reports they have been through therapy at a preveious admission, as well as therapy at home. They report that they can assist pt. with ADL functional needs at home. No follow-up services are warranted this time will complete the order per pt. familiy request  Harrel Carina, MS, OTR/L 12/10/2020, 10:15 AM

## 2020-12-10 NOTE — TOC Progression Note (Signed)
Transition of Care [facility]) - Progression Note    Patient Details  Name: [name] MRN: [id number] Date of Birth: [birth year]  Transition of Care [facility]) CM/SW Contact  Izola Price, RN Phone Number: 12/10/2020, 12:46 PM  Clinical Narrative:  Patient has discharge orders and no has declined OT recommendations, no PT evaluation listed, first try was not completed secondary to patient declination. Heart failure RN has consulted with patient on 12/06/20. Follow up in outpatient HF clinic on 12/15/20 per HF RN progress notes.   Contacted spouse due to no answer in patient room. Spouse states they have all DME needed from prior discharges and still declines any HH PT/OT needs. Has strong family support with grown children who help with any transportation needs. Com[facility] to [facility] to [facility] work. Spouse did not have any further questions or expressed any neeeds. Informed her that is they change their mind regarding therapy to reach out to PCP to have that set up.  Son will be transporting patient and spouse to home.  Simmie Davies RN CM [id number] 12/10/20.          Expected Discharge Plan and Services           Expected Discharge Date: 12/10/20                                     Social Determinants of Health (SDOH) Interventions    Readmission Risk Interventions Readmission Risk Prevention Plan 06/21/2020  Transportation Screening Complete  PCP or Specialist Appt within 3-5 Days Complete  HRI or [place] Complete  Social Work Consult for [place] Planning/Counseling Complete  Palliative Care Screening Not Applicable  Medication Review [employer]) Complete  Some recent data might be hidden

## 2020-12-10 NOTE — Consult Note (Signed)
[place] for warfarin Indication: atrial flutter   Allergies  Allergen Reactions   Carafate [Sucralfate] Other (See Comments)    Burns stomach   Clarithromycin Nausea Only   Famotidine Other (See Comments)    ABD. PAIN   Levaquin [Levofloxacin] Other (See Comments)    Stomach pain, has to eat a lot of food   Omeprazole Diarrhea   Oxytetracycline Other (See Comments)    BUMPS   Penicillins Swelling    Amoxicillin ok, Pt states he had to have a shot to reverse the reaction Has patient had a PCN reaction causing immediate rash, facial/tongue/throat swelling, SOB or lightheadedness with hypotension: Yes Has patient had a PCN reaction causing severe rash involving mucus membranes or skin necrosis: No Has patient had a PCN reaction that required hospitalization No Has patient had a PCN reaction occurring within the last 10 years: Yes If all of the above answers are "NO", then may proceed with   Proton Pump Inhibitors Other (See Comments)    GI upset, diarrhea, gas, bloating   Ranitidine Diarrhea   Doxycycline Rash    Patient Measurements: Height: '5\' 4"'$  (162.6 cm) Weight: 62.2 kg (137 lb 2 oz) IBW/kg (Calculated) : 59.2  Vital Signs: Temp: 97.6 F (36.4 C) (09/03 0747) Temp Source: Oral (09/03 0553) BP: 97/55 (09/03 0747) Pulse Rate: 60 (09/03 0747)  Labs: Recent Labs    12/08/20 0536 12/09/20 0441 12/10/20 0439  HGB  --  11.7* 12.1*  HCT  --  33.8* 35.3*  PLT  --  107* 112*  LABPROT 21.3* 24.4* 22.7*  INR 1.9* 2.2* 2.0*  CREATININE 1.39* 1.53* 1.71*     Estimated Creatinine Clearance: 25.5 mL/min (A) (by C-G formula based on SCr of 1.71 mg/dL (H)).   Medical History: Past Medical History:  Diagnosis Date   AICD (automatic cardioverter/defibrillator) present    s/p gen change 04/2015 w/ MDT [name] Crt-D  DTBA1D1, Serial number [id number]   Allergic rhinitis    [name]   Anemia    Anxiety    Arthritis    Atrial  flutter ([place])    A.  Status post cardioversion; B.  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 Lucky Cowboy)   Chronic lung disease    spirometry 2015 - no obstruction, + mild restrictive lung disease   Chronic sinusitis    Chronic systolic congestive heart failure ([place])    Dyspnea    with exertion   Extrinsic asthma    [name]   Fall with injury 05/28/2017   GERD (gastroesophageal reflux disease) 2010   h/o esophageal stricture with dilation, LA grade C reflux esophagitis by EGD 2010   Goiter    History of diverticulitis of colon 06/2020   History of GI bleed    Secondary to hemorrhoids   History of seasonal allergies    Hypertension    IBS (irritable bowel syndrome)    LBBB (left bundle branch block)    Multiple pulmonary nodules 06/2013   RUL (Dr. Adam Phenix at [facility]) - ?vasculitis as of last CT at [facility]   NICM (nonischemic cardiomyopathy) [facility])    Cardiac catheterization March 2006 without coronary disease; EF 25% 2018 Jan   Orthopnea    Porphyria [facility])    Presence of permanent cardiac  pacemaker    Sinus congestion 09/25/2010    Medications:  Warfarin PTA: 5 mg Monday, Friday 2.5 mg all other days TWD = 22.5 mg   Assessment: 87 y.o. male with PMH of atrial flutter on chronic warfarin therapy of outpatient regimen outline above. Presented with SOB with associated leg swelling. INR 2.1 (therapeutic) on admission.  DD Interactions: Patient started on 10 day course of cefdinir 300 mg BID 12/03/20, stopped by MD 9/1 PTA prednisone (10 mg daily) temporarily increased to 10 mg BID x5 days on 12/03/20 for sinusitis, but has returned to '10mg'$  daily (increase INR) Allopurinol can enhance warfarin effect. (Home med)  Date INR Warfarin Dose  8/27 -- 2.5 mg (PTA) 8/28 2.1 No dose given 8/29 1.9 5 mg 8/30 1.7 5  mg 8/31 1.6 5 mg 9/01 1.9 2.5 mg 09/02 2.2 '5mg'$  9/3 2   Goal of Therapy:  INR 2-3 (Per pt Pulmonologist wants INR around 2.0) Monitor platelets by anticoagulation protocol: Yes   Plan:  INR is therapeutic but trending down probably due to the 2.5 mg dose given on 9/1. Will give pt warfarin 2.5 mg x 1 (home regimen). Daily INR ordered. CBC at least every 3 days.    Oswald Hillock, PharmD 12/10/2020 10:44 AM

## 2020-12-12 ENCOUNTER — Telehealth: Payer: Self-pay | Admitting: Physician Assistant

## 2020-12-12 NOTE — Telephone Encounter (Signed)
Got called from son,[name], that his dad is dealing with sinus congestion. Patient was dealing with sinus congestion prior to recent admission. He was taking Cefnidir in outpatient setting but did not given during admission per son.   Patient's breathing is improving and weight is going down.  Son wants to give cefnidir to his dad due to ongoing sinus congestion. I have recommended to okay to give but reach out to PCP tomorrow.

## 2020-12-13 ENCOUNTER — Observation Stay
Admission: EM | Admit: 2020-12-13 | Discharge: 2020-12-16 | Disposition: A | Discharge status: Home / Self Care | Payer: Medicare HMO | Attending: Internal Medicine | Admitting: Internal Medicine

## 2020-12-13 ENCOUNTER — Other Ambulatory Visit: Payer: Self-pay

## 2020-12-13 ENCOUNTER — Emergency Department: Payer: Medicare HMO

## 2020-12-13 DIAGNOSIS — Z8249 Family history of ischemic heart disease and other diseases of the circulatory system: Secondary | ICD-10-CM | POA: Diagnosis not present

## 2020-12-13 DIAGNOSIS — Z950 Presence of cardiac pacemaker: Secondary | ICD-10-CM | POA: Diagnosis not present

## 2020-12-13 DIAGNOSIS — R7401 Elevation of levels of liver transaminase levels: Secondary | ICD-10-CM | POA: Diagnosis not present

## 2020-12-13 DIAGNOSIS — E876 Hypokalemia: Secondary | ICD-10-CM | POA: Insufficient documentation

## 2020-12-13 DIAGNOSIS — K582 Mixed irritable bowel syndrome: Secondary | ICD-10-CM | POA: Insufficient documentation

## 2020-12-13 DIAGNOSIS — E875 Hyperkalemia: Secondary | ICD-10-CM | POA: Diagnosis present

## 2020-12-13 DIAGNOSIS — I130 Hypertensive heart and chronic kidney disease with heart failure and stage 1 through stage 4 chronic kidney disease, or unspecified chronic kidney disease: Secondary | ICD-10-CM | POA: Diagnosis not present

## 2020-12-13 DIAGNOSIS — I428 Other cardiomyopathies: Secondary | ICD-10-CM

## 2020-12-13 DIAGNOSIS — Z79899 Other long term (current) drug therapy: Secondary | ICD-10-CM | POA: Diagnosis not present

## 2020-12-13 DIAGNOSIS — Z880 Allergy status to penicillin: Secondary | ICD-10-CM | POA: Insufficient documentation

## 2020-12-13 DIAGNOSIS — I482 Chronic atrial fibrillation, unspecified: Secondary | ICD-10-CM | POA: Diagnosis present

## 2020-12-13 DIAGNOSIS — N1831 Chronic kidney disease, stage 3a: Secondary | ICD-10-CM | POA: Insufficient documentation

## 2020-12-13 DIAGNOSIS — R945 Abnormal results of liver function studies: Secondary | ICD-10-CM | POA: Diagnosis not present

## 2020-12-13 DIAGNOSIS — R7303 Prediabetes: Secondary | ICD-10-CM | POA: Insufficient documentation

## 2020-12-13 DIAGNOSIS — I10 Essential (primary) hypertension: Secondary | ICD-10-CM | POA: Diagnosis present

## 2020-12-13 DIAGNOSIS — I5022 Chronic systolic (congestive) heart failure: Secondary | ICD-10-CM | POA: Diagnosis present

## 2020-12-13 DIAGNOSIS — J45909 Unspecified asthma, uncomplicated: Secondary | ICD-10-CM | POA: Diagnosis not present

## 2020-12-13 DIAGNOSIS — R7989 Other specified abnormal findings of blood chemistry: Secondary | ICD-10-CM | POA: Diagnosis not present

## 2020-12-13 DIAGNOSIS — R188 Other ascites: Secondary | ICD-10-CM | POA: Diagnosis not present

## 2020-12-13 DIAGNOSIS — J90 Pleural effusion, not elsewhere classified: Secondary | ICD-10-CM | POA: Insufficient documentation

## 2020-12-13 DIAGNOSIS — I5033 Acute on chronic diastolic (congestive) heart failure: Secondary | ICD-10-CM | POA: Diagnosis not present

## 2020-12-13 DIAGNOSIS — Z743 Need for continuous supervision: Secondary | ICD-10-CM | POA: Diagnosis not present

## 2020-12-13 DIAGNOSIS — E872 Acidosis, unspecified: Secondary | ICD-10-CM | POA: Diagnosis present

## 2020-12-13 DIAGNOSIS — I4821 Permanent atrial fibrillation: Secondary | ICD-10-CM | POA: Diagnosis not present

## 2020-12-13 DIAGNOSIS — E860 Dehydration: Secondary | ICD-10-CM | POA: Diagnosis not present

## 2020-12-13 DIAGNOSIS — N183 Chronic kidney disease, stage 3 unspecified: Secondary | ICD-10-CM | POA: Diagnosis present

## 2020-12-13 DIAGNOSIS — Z96611 Presence of right artificial shoulder joint: Secondary | ICD-10-CM | POA: Diagnosis not present

## 2020-12-13 DIAGNOSIS — Z881 Allergy status to other antibiotic agents status: Secondary | ICD-10-CM | POA: Insufficient documentation

## 2020-12-13 DIAGNOSIS — E871 Hypo-osmolality and hyponatremia: Secondary | ICD-10-CM | POA: Diagnosis not present

## 2020-12-13 DIAGNOSIS — R112 Nausea with vomiting, unspecified: Secondary | ICD-10-CM | POA: Diagnosis not present

## 2020-12-13 DIAGNOSIS — K5909 Other constipation: Secondary | ICD-10-CM | POA: Diagnosis present

## 2020-12-13 DIAGNOSIS — Z7901 Long term (current) use of anticoagulants: Secondary | ICD-10-CM | POA: Insufficient documentation

## 2020-12-13 DIAGNOSIS — R1011 Right upper quadrant pain: Secondary | ICD-10-CM | POA: Insufficient documentation

## 2020-12-13 DIAGNOSIS — I517 Cardiomegaly: Secondary | ICD-10-CM | POA: Diagnosis not present

## 2020-12-13 DIAGNOSIS — R110 Nausea: Secondary | ICD-10-CM | POA: Diagnosis not present

## 2020-12-13 DIAGNOSIS — D696 Thrombocytopenia, unspecified: Secondary | ICD-10-CM | POA: Insufficient documentation

## 2020-12-13 DIAGNOSIS — R197 Diarrhea, unspecified: Secondary | ICD-10-CM | POA: Diagnosis not present

## 2020-12-13 DIAGNOSIS — R531 Weakness: Secondary | ICD-10-CM

## 2020-12-13 DIAGNOSIS — R0902 Hypoxemia: Secondary | ICD-10-CM | POA: Diagnosis not present

## 2020-12-13 DIAGNOSIS — R000 Tachycardia, unspecified: Secondary | ICD-10-CM | POA: Diagnosis not present

## 2020-12-13 DIAGNOSIS — M109 Gout, unspecified: Secondary | ICD-10-CM | POA: Insufficient documentation

## 2020-12-13 DIAGNOSIS — D649 Anemia, unspecified: Secondary | ICD-10-CM | POA: Insufficient documentation

## 2020-12-13 DIAGNOSIS — R609 Edema, unspecified: Secondary | ICD-10-CM | POA: Diagnosis not present

## 2020-12-13 DIAGNOSIS — F419 Anxiety disorder, unspecified: Secondary | ICD-10-CM | POA: Diagnosis present

## 2020-12-13 LAB — COMPREHENSIVE METABOLIC PANEL
ALT: 361 U/L — ABNORMAL HIGH (ref 0–44)
AST: 432 U/L — ABNORMAL HIGH (ref 15–41)
Albumin: 3.9 g/dL (ref 3.5–5.0)
Alkaline Phosphatase: 107 U/L (ref 38–126)
Anion gap: 16 — ABNORMAL HIGH (ref 5–15)
BUN: 51 mg/dL — ABNORMAL HIGH (ref 8–23)
CO2: 25 mmol/L (ref 22–32)
Calcium: 9.4 mg/dL (ref 8.9–10.3)
Chloride: 88 mmol/L — ABNORMAL LOW (ref 98–111)
Creatinine, Ser: 2.22 mg/dL — ABNORMAL HIGH (ref 0.61–1.24)
GFR, Estimated: 28 mL/min — ABNORMAL LOW (ref >60)
Glucose, Bld: 114 mg/dL — ABNORMAL HIGH (ref 70–99)
Potassium: 5.5 mmol/L — ABNORMAL HIGH (ref 3.5–5.1)
Sodium: 129 mmol/L — ABNORMAL LOW (ref 135–145)
Total Bilirubin: 2.7 mg/dL — ABNORMAL HIGH (ref 0.3–1.2)
Total Protein: 6.4 g/dL — ABNORMAL LOW (ref 6.5–8.1)

## 2020-12-13 LAB — GASTROINTESTINAL PANEL BY PCR, STOOL (REPLACES STOOL CULTURE)

## 2020-12-13 LAB — CBC WITH DIFFERENTIAL/PLATELET
Abs Immature Granulocytes: 0.11 10*3/uL — ABNORMAL HIGH (ref 0.00–0.07)
Basophils Absolute: 0.1 10*3/uL (ref 0.0–0.1)
Basophils Relative: 1 %
Eosinophils Absolute: 0 10*3/uL (ref 0.0–0.5)
Eosinophils Relative: 0 %
HCT: 42.5 % (ref 39.0–52.0)
Hemoglobin: 14.1 g/dL (ref 13.0–17.0)
Immature Granulocytes: 1 %
Lymphocytes Relative: 9 %
Lymphs Abs: 1 10*3/uL (ref 0.7–4.0)
MCH: 29.4 pg (ref 26.0–34.0)
MCHC: 33.2 g/dL (ref 30.0–36.0)
MCV: 88.7 fL (ref 80.0–100.0)
Monocytes Absolute: 1.2 10*3/uL — ABNORMAL HIGH (ref 0.1–1.0)
Monocytes Relative: 11 %
Neutro Abs: 8.2 10*3/uL — ABNORMAL HIGH (ref 1.7–7.7)
Neutrophils Relative %: 78 %
Platelets: 142 10*3/uL — ABNORMAL LOW (ref 150–400)
RBC: 4.79 MIL/uL (ref 4.22–5.81)
RDW: 18.3 % — ABNORMAL HIGH (ref 11.5–15.5)
WBC: 10.5 10*3/uL (ref 4.0–10.5)
nRBC: 0.4 % — ABNORMAL HIGH (ref 0.0–0.2)

## 2020-12-13 LAB — LACTIC ACID, PLASMA
Lactic Acid, Venous: 5.4 mmol/L (ref 0.5–1.9)
Lactic Acid, Venous: 4.2 mmol/L (ref 0.5–1.9)

## 2020-12-13 LAB — APTT: aPTT: 36 seconds (ref 24–36)

## 2020-12-13 LAB — C DIFFICILE QUICK SCREEN W PCR REFLEX
C Diff antigen: NEGATIVE
C Diff interpretation: NOT DETECTED
C Diff toxin: NEGATIVE

## 2020-12-13 LAB — ACETAMINOPHEN LEVEL: Acetaminophen (Tylenol), Serum: <10 ug/mL — ABNORMAL LOW (ref 10–30)

## 2020-12-13 LAB — BRAIN NATRIURETIC PEPTIDE: B Natriuretic Peptide: 2212.4 pg/mL — ABNORMAL HIGH (ref 0.0–100.0)

## 2020-12-13 LAB — TROPONIN I (HIGH SENSITIVITY)
Troponin I (High Sensitivity): 9 ng/L (ref <18)
Troponin I (High Sensitivity): 11 ng/L (ref <18)

## 2020-12-13 LAB — PROTIME-INR
INR: 2.5 — ABNORMAL HIGH (ref 0.8–1.2)
Prothrombin Time: 27 seconds — ABNORMAL HIGH (ref 11.4–15.2)

## 2020-12-13 MED ORDER — WARFARIN SODIUM 2.5 MG PO TABS: 2.5000 mg | ORAL_TABLET | ORAL | Status: DC

## 2020-12-13 MED ORDER — WARFARIN SODIUM 5 MG PO TABS: 5.0000 mg | ORAL_TABLET | ORAL | Status: DC

## 2020-12-13 MED ORDER — ALLOPURINOL 100 MG PO TABS
100.0000 mg | ORAL_TABLET | Freq: Every day | ORAL | Status: DC
  Administered 2020-12-14 – 2020-12-16 (×3): 100 mg via ORAL
  Filled 2020-12-13 (×3): qty 1

## 2020-12-13 MED ORDER — SODIUM CHLORIDE 0.9 % IV SOLN: 250.0000 mL | INTRAVENOUS | Status: DC | PRN

## 2020-12-13 MED ORDER — SODIUM CHLORIDE 0.9% FLUSH
3.0000 mL | Freq: Two times a day (BID) | INTRAVENOUS | Status: DC
  Administered 2020-12-14 – 2020-12-16 (×4): 3 mL via INTRAVENOUS

## 2020-12-13 MED ORDER — WARFARIN - PHARMACIST DOSING INPATIENT
Freq: Every day | Status: DC
  Filled 2020-12-13: qty 1

## 2020-12-13 MED ORDER — AZELASTINE HCL 0.1 % NA SOLN
1.0000 | Freq: Two times a day (BID) | NASAL | Status: DC
  Administered 2020-12-13 – 2020-12-16 (×6): 1 via NASAL
  Filled 2020-12-13: qty 30

## 2020-12-13 MED ORDER — WARFARIN SODIUM 1 MG PO TABS
1.5000 mg | ORAL_TABLET | Freq: Once | ORAL | Status: AC
  Administered 2020-12-13: 1.5 mg via ORAL
  Filled 2020-12-13 (×2): qty 1

## 2020-12-13 MED ORDER — ONDANSETRON HCL 4 MG PO TABS: 4.0000 mg | ORAL_TABLET | Freq: Four times a day (QID) | ORAL | Status: DC | PRN

## 2020-12-13 MED ORDER — METOCLOPRAMIDE HCL 5 MG/ML IJ SOLN
10.0000 mg | Freq: Once | INTRAMUSCULAR | Status: AC
  Administered 2020-12-13: 10 mg via INTRAVENOUS
  Filled 2020-12-13: qty 2

## 2020-12-13 MED ORDER — FLUTICASONE PROPIONATE 50 MCG/ACT NA SUSP
1.0000 | Freq: Every day | NASAL | Status: DC
  Administered 2020-12-14 – 2020-12-16 (×3): 1 via NASAL
  Filled 2020-12-13: qty 16

## 2020-12-13 MED ORDER — ONDANSETRON HCL 4 MG/2ML IJ SOLN
4.0000 mg | Freq: Four times a day (QID) | INTRAMUSCULAR | Status: DC | PRN
  Administered 2020-12-13: 4 mg via INTRAVENOUS
  Filled 2020-12-13: qty 2

## 2020-12-13 MED ORDER — PREDNISONE 10 MG PO TABS
10.0000 mg | ORAL_TABLET | Freq: Every day | ORAL | Status: DC
  Administered 2020-12-14 – 2020-12-16 (×3): 10 mg via ORAL
  Filled 2020-12-13 (×3): qty 1

## 2020-12-13 MED ORDER — VITAMIN B-12 1000 MCG PO TABS
1000.0000 ug | ORAL_TABLET | Freq: Every day | ORAL | Status: DC
  Administered 2020-12-14 – 2020-12-16 (×3): 1000 ug via ORAL
  Filled 2020-12-13 (×3): qty 1

## 2020-12-13 MED ORDER — MONTELUKAST SODIUM 10 MG PO TABS
10.0000 mg | ORAL_TABLET | Freq: Every day | ORAL | Status: DC
  Administered 2020-12-13 – 2020-12-15 (×3): 10 mg via ORAL
  Filled 2020-12-13 (×3): qty 1

## 2020-12-13 MED ORDER — ALPRAZOLAM 0.25 MG PO TABS
0.1250 mg | ORAL_TABLET | Freq: Two times a day (BID) | ORAL | Status: DC | PRN
  Administered 2020-12-13 – 2020-12-16 (×6): 0.125 mg via ORAL
  Filled 2020-12-13 (×6): qty 1

## 2020-12-13 MED ORDER — ONDANSETRON HCL 4 MG/2ML IJ SOLN
4.0000 mg | Freq: Once | INTRAMUSCULAR | Status: AC
  Administered 2020-12-13: 4 mg via INTRAVENOUS
  Filled 2020-12-13: qty 2

## 2020-12-13 MED ORDER — SODIUM CHLORIDE 0.9% FLUSH: 3.0000 mL | INTRAVENOUS | Status: DC | PRN

## 2020-12-13 MED ORDER — SODIUM CHLORIDE 0.9 % IV BOLUS
250.0000 mL | Freq: Once | INTRAVENOUS | Status: AC
  Administered 2020-12-13: 250 mL via INTRAVENOUS

## 2020-12-13 MED ORDER — SODIUM ZIRCONIUM CYCLOSILICATE 10 G PO PACK
10.0000 g | PACK | Freq: Every day | ORAL | Status: DC
  Administered 2020-12-13: 10 g via ORAL
  Filled 2020-12-13 (×2): qty 1

## 2020-12-13 MED ORDER — TAMSULOSIN HCL 0.4 MG PO CAPS
0.8000 mg | ORAL_CAPSULE | Freq: Every day | ORAL | Status: DC
  Administered 2020-12-13 – 2020-12-15 (×3): 0.8 mg via ORAL
  Filled 2020-12-13 (×3): qty 2

## 2020-12-13 MED ORDER — SODIUM CHLORIDE 0.9 % IV SOLN: Freq: Once | INTRAVENOUS | Status: AC

## 2020-12-13 NOTE — ED Triage Notes (Signed)
Pt BIBA from home. Pt was d/c from hospital Saturday and has had nausea and weakness since. Denies pain. Pt endorses some SHOB, lower extremity edema

## 2020-12-13 NOTE — ED Notes (Signed)
US at bedside

## 2020-12-13 NOTE — Progress Notes (Signed)
ANTICOAGULATION CONSULT NOTE - Initial Consult  Pharmacy Consult for warfarin Indication: atrial fibrillation  Allergies  Allergen Reactions   Carafate [Sucralfate] Other (See Comments)    Burns stomach   Clarithromycin Nausea Only   Famotidine Other (See Comments)    ABD. PAIN   Levaquin [Levofloxacin] Other (See Comments)    Stomach pain, has to eat a lot of food   Omeprazole Diarrhea   Oxytetracycline Other (See Comments)    BUMPS   Penicillins Swelling    Amoxicillin ok, Pt states he had to have a shot to reverse the reaction Has patient had a PCN reaction causing immediate rash, facial/tongue/throat swelling, SOB or lightheadedness with hypotension: Yes Has patient had a PCN reaction causing severe rash involving mucus membranes or skin necrosis: No Has patient had a PCN reaction that required hospitalization No Has patient had a PCN reaction occurring within the last 10 years: Yes If all of the above answers are "NO", then may proceed with   Proton Pump Inhibitors Other (See Comments)    GI upset, diarrhea, gas, bloating   Ranitidine Diarrhea   Doxycycline Rash    Patient Measurements: Height: '5\' 4"'$  (162.6 cm) Weight: 56.4 kg (124 lb 6.4 oz) IBW/kg (Calculated) : 59.2   Vital Signs: Temp: 99 F (37.2 C) (09/06 0728) Temp Source: Oral (09/06 0728) BP: 109/65 (09/06 1142) Pulse Rate: 54 (09/06 1142)  Labs: Recent Labs    12/13/20 0802 12/13/20 0953  HGB 14.1  --   HCT 42.5  --   PLT 142*  --   APTT 36  --   LABPROT 27.0*  --   INR 2.5*  --   CREATININE 2.22*  --   TROPONINIHS 9 11    Estimated Creatinine Clearance: 18.7 mL/min (A) (by C-G formula based on SCr of 2.22 mg/dL (H)).   Medical History: Past Medical History:  Diagnosis Date   AICD (automatic cardioverter/defibrillator) present    s/p gen change 04/2015 w/ MDT [name] Crt-D  DTBA1D1, Serial number [id number]   Allergic rhinitis    [name]   Anemia    Anxiety    Arthritis    Atrial  flutter ([place])    A.  Status post cardioversion; B.  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 Lucky Cowboy)   Chronic lung disease    spirometry 2015 - no obstruction, + mild restrictive lung disease   Chronic sinusitis    Chronic systolic congestive heart failure ([place])    Dyspnea    with exertion   Extrinsic asthma    [name]   Fall with injury 05/28/2017   GERD (gastroesophageal reflux disease) 2010   h/o esophageal stricture with dilation, LA grade C reflux esophagitis by EGD 2010   Goiter    History of diverticulitis of colon 06/2020   History of GI bleed    Secondary to hemorrhoids   History of seasonal allergies    Hypertension    IBS (irritable bowel syndrome)    LBBB (left bundle branch block)    Multiple pulmonary nodules 06/2013   RUL ([facility]. Adam Phenix at [facility]) - ?vasculitis as of [facility]   NICM (nonischemic cardiomyopathy) [facility])    Cardiac catheterization March 2006 without coronary disease; EF 25% 2018 Jan   Orthopnea  Porphyria ([place])    Presence of permanent cardiac pacemaker    Sinus congestion 09/25/2010    Medications:  (Not in a hospital admission)  Scheduled:   allopurinol  100 mg Oral Daily   Azelastine HCl  1 spray Each Nare BID   fluticasone  1 spray Each Nare Daily   montelukast  10 mg Oral QHS   [START ON 12/14/2020] predniSONE  10 mg Oral Q breakfast   sodium chloride flush  3 mL Intravenous Q12H   sodium zirconium cyclosilicate  10 g Oral Daily   tamsulosin  0.8 mg Oral QPC supper   vitamin B-12  1,000 mcg Oral Daily   warfarin  2.5 mg Oral UD   [START ON 12/15/2020] warfarin  5 mg Oral Once per day on Mon Thu   Infusions:   sodium chloride     PRN: sodium chloride, ALPRAZolam, ondansetron **OR** ondansetron (ZOFRAN) IV, sodium chloride flush Anti-infectives (From  admission, onward)    None       Assessment: [name] with PMH of permanent atrial fibrillation on warfarin presenting with nausea, diarrhea, and weakness. Last week, patient presented with SOB and associated leg swelling last week, determined to be related to nonischemic cardiomyopathy with an EF of 20-25%. Estimated CHADS2VASC 4. Pharmacy consulted to assist with warfarin management while patient is admitted.   Patient admitted with multiple electrolyte abnormalities including transaminitis (AST/ALT 432/361) which appears new. Abdominal ultrasound suggestive of cirrhosis. Transaminitis may also be explained by hepatic congestion.   Warfarin PTA 5 mg Monday, Friday 2.5 mg all other days TWD = 22.5 mg  During admission last week, patient received the following:  Date    INR      Warfarin Dose  8/27     --          2.5 mg (PTA) 8/28     2.1       No dose given 8/29     1.9       5 mg 8/30     1.7       5 mg 8/31     1.6       5 mg 9/01     1.9       2.5 mg 09/02   2.2       '5mg'$  9/3       2    Warfarin dosing during this admission: Date INR Warfarin Dose  9/5 -- 5 mg (PTA)  9/6 2.5    DDI Lokelma: increased concentration of warfarin (Given at 1000 daily) - separate administration by 2 hours Allopurinol: enhanced anticoagulant effect (PTA med continued)  Goal of Therapy:  INR 2-3   Plan:  --Given signs of liver injury and likely poor oral intake from nausea, suspect patient will be more sensitive to warfarin, thus will give warfarin 1.5 mg x 1 today, roughly 33% decrease from home dose --Patient also with diarrhea, therefore also possibility for poor absorption --Daily INR per protocol --CBC at least every 3 days per protocol   Wynelle Cleveland, PharmD Pharmacy Resident  12/13/2020 12:09 PM

## 2020-12-13 NOTE — H&P (Signed)
History and Physical    [name] [id number] DOB: 1931/05/08 DOA: 12/13/2020  PCP: Ria Bush, MD   Patient coming from: Home  I have personally briefly reviewed patient's old medical records in [place]  Chief Complaint: Nausea/weakness  HPI: [name] is a 87 y.o. male with medical history significant for chronic systolic CHF with EF of 0000000, hypertension, asthma, GERD, gout, anxiety, AICD placement, left bundle blockage, porphyruria, IBS, GI bleeding, diverticulitis, BPH, atrial fibrillation on Coumadin, anemia, aortic valve regurgitation, esophageal stricture, pulmonary hypertension, thrombocytopenia, CKD-3A, sinus infection, who was recently discharged from the hospital after hospitalization for acute exacerbation of his known CHF.  He presents to the emergency room for evaluation of nausea and weakness. Patient's family who were at the bedside state that since his discharge he has had nausea and has been having to clear his throat frequently.  His oral intake has been poor and he has also complained of having chills.  Over the last 24 hours patient has had multiple loose stools which he attributes to taking too many laxatives.  He also complains of worsening shortness of breath from his baseline and had to use his sister's oxygen over the weekend because he could not breathe. He has bilateral lower extremity swelling and states that he had chills at home. He denies having any chest pain, no abdominal pain, no dizziness, no lightheadedness, no hematemesis, no hematochezia, no headache, no blurred vision, no diaphoresis or palpitations. Labs show sodium 129, potassium 5.5, chloride 88, bicarb 25, glucose 114, BUN 51, creatinine 2.22, calcium 9.4, alkaline phosphatase 107, albumin 3.9, AST 432, ALT 361, total protein 6.4, total bilirubin 2.7, BNP 2212, lactic acid 5.4, white count 10.2, hemoglobin 14.1, hematocrit 42.5, MCV 88.7, RDW 18.3, platelet count  142, PT 27, INR 2.5 Chest x-ray reviewed by me shows chronic cardiomegaly.  Small pleural effusions, stable.  No new cardiopulmonary abnormality. Liver ultrasound shows small amount of right upper quadrant ascites.  Mildly nodular liver findings are suggestive of cirrhosis. Twelve-lead EKG shows paced rhythm   ED Course: Patient is an 87 year old Caucasian male who was recently discharged from the hospital and who presents to the emergency room for evaluation of nausea, weakness and diarrhea since his hospital discharge. Patient is noted to have multiple lab abnormalities which include slight worsening of his renal function from his baseline on discharge 1.7 >> 2.22, hyponatremia as well as lactic acidosis with lactate level of 5.4. He will be referred to observation for further evaluation.   EKG as reviewed by me :  Imaging:   Review of Systems: As per HPI otherwise all other systems reviewed and negative.    Past Medical History:  Diagnosis Date   AICD (automatic cardioverter/defibrillator) present    s/p gen change 04/2015 w/ MDT [name] Crt-D  [id number], Serial number [id number]   Allergic rhinitis    [name]   Anemia    Anxiety    Arthritis    Atrial flutter ([place])    A.  Status post cardioversion; B.  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 Lucky Cowboy)   Chronic lung disease    spirometry 2015 - no obstruction, + mild restrictive lung disease   Chronic sinusitis    Chronic systolic congestive heart failure (HCC)    Dyspnea  with exertion   Extrinsic asthma    Shon Baton with injury 05/28/2017   GERD (gastroesophageal reflux disease) 2010   h/o esophageal stricture with dilation, LA grade C reflux esophagitis by EGD 2010   Goiter    History of diverticulitis of colon 06/2020   History of GI bleed    Secondary  to hemorrhoids   History of seasonal allergies    Hypertension    IBS (irritable bowel syndrome)    LBBB (left bundle branch block)    Multiple pulmonary nodules 06/2013   RUL ([facility]. Adam Phenix [facility]) - ?vasculitis as of last CT at [facility]   NICM (nonischemic cardiomyopathy) [facility])    Cardiac catheterization March 2006 without coronary disease; EF 25% 2018 Jan   Orthopnea    Porphyria [facility])    Presence of permanent cardiac pacemaker    Sinus congestion 09/25/2010    Past Surgical History:  Procedure Laterality Date   BACK SURGERY  1997   bulging disks   BiV-AICD implant     [employer]   CARDIAC CATHETERIZATION  06/22/04   Severe, nonischemic cardiomyopathy,EF 25-30%   CARDIOVERSION  02/22/09   AFlutter-([facility])   CATARACT EXTRACTION W/PHACO Right 12/21/2019   Procedure: CATARACT EXTRACTION PHACO AND INTRAOCULAR LENS PLACEMENT (IOC) RIGHT 2.23  00:21.9;  Surgeon: Eulogio Bear, MD;  Location: [place];  Service: Ophthalmology;  Laterality: Right;   CATARACT EXTRACTION W/PHACO Left 02/01/2020   Procedure: CATARACT EXTRACTION PHACO AND INTRAOCULAR LENS PLACEMENT (IOC) LEFT 3.67 00:39.5;  Surgeon: Eulogio Bear, MD;  Location: [place];  Service: Ophthalmology;  Laterality: Left;   CHOLECYSTECTOMY     COLONOSCOPY  10/99   Divertic, splenic, hepatic fleure only   COLONOSCOPY  10/21/08   aborted-divertics, int hemms (Dr. Vira Agar)   CORONARY ANGIOPLASTY     EP IMPLANTABLE DEVICE N/A 04/25/2015   Procedure: BIV ICD Generator Changeout;  Surgeon: Evans Lance, MD;  Location: [place] [facility];  Service: Cardiovascular;  Laterality: N/A;   ESOPHAGEAL DILATION  02/18/98   ESOPHAGOGASTRODUODENOSCOPY  01/1998   stricture, sliding HH, GERD   ESOPHAGOGASTRODUODENOSCOPY  04/08/01   stricture, gastritis, HH, GERD-no dilation(Dr. Henrene Pastor)   ESOPHAGOGASTRODUODENOSCOPY  12/22/04   stricture; gastritis; duodenitis, GERD   ESOPHAGOGASTRODUODENOSCOPY  10/21/08    Reflux esophagitis; Erythem. Duod. (Dr. Vira Agar)   ESOPHAGOGASTRODUODENOSCOPY  08/2013   erythema gastric fundus - minimal gastritis, HH ([place])   ESOPHAGOGASTRODUODENOSCOPY  08/2016   dysphagia - mod schatzki rin, dilated Tiffany Kocher)   ESOPHAGOGASTRODUODENOSCOPY (EGD) WITH PROPOFOL N/A 08/24/2016   mod schatzki ring dilated, duodenal deformity Manya Silvas, MD)   goiter removal     HERNIA REPAIR     HERNIA REPAIR  01/30/02   Dr. Smith Mince / REPLACE / REMOVE PACEMAKER  2007 and 2017   JOINT REPLACEMENT Right    shoulder   NOSE SURGERY  1971   sinus surgery   PENILE PROSTHESIS IMPLANT  2007   [place]   PFT  10/2013   FVC 61%, FEV1 63%, ratio 0.76   Pulmonary eval  04/2002   ([facility]) Chronic congestive symptoms   REVERSE SHOULDER ARTHROPLASTY Right 01/17/2016   Procedure: REVERSE SHOULDER ARTHROPLASTY;  Surgeon: Corky Mull, MD;  Location: [facility];  Service: Orthopedics;  Laterality: Right;   REVERSE TOTAL SHOULDER ARTHROPLASTY Right 01/2016   Poggi   US ECHOCARDIOGRAPHY  07/13/04   EF 25-30%, Mod LVH; LA severe dilation; Mild AR; IRTR  US ECHOCARDIOGRAPHY  11/27/06   hypokinesis posterior wall, EF 35%; mild AR     reports that he has never smoked. He has never used smokeless tobacco. He reports that he does not drink alcohol and does not use drugs.  Allergies  Allergen Reactions   Carafate [Sucralfate] Other (See Comments)    Burns stomach   Clarithromycin Nausea Only   Famotidine Other (See Comments)    ABD. PAIN   Levaquin [Levofloxacin] Other (See Comments)    Stomach pain, has to eat a lot of food   Omeprazole Diarrhea   Oxytetracycline Other (See Comments)    BUMPS   Penicillins Swelling    Amoxicillin ok, Pt states he had to have a shot to reverse the reaction Has patient had a PCN reaction causing immediate rash, facial/tongue/throat swelling, SOB or lightheadedness with hypotension: Yes Has patient had a PCN reaction causing severe rash involving mucus membranes  or skin necrosis: No Has patient had a PCN reaction that required hospitalization No Has patient had a PCN reaction occurring within the last 10 years: Yes If all of the above answers are "NO", then may proceed with   Proton Pump Inhibitors Other (See Comments)    GI upset, diarrhea, gas, bloating   Ranitidine Diarrhea   Doxycycline Rash    Family History  Problem Relation Age of Onset   Coronary artery disease Father    Hypertension Father    Heart failure Mother    Dysphagia Sister    Breast cancer Other    Ovarian cancer Other    Uterine cancer Other    Other Sister        stomach problems   Other Sister        stomach problems   Other Sister        stomach problems   Alcohol abuse Maternal Uncle       Prior to Admission medications   Medication Sig Start Date End Date Taking? Authorizing Provider  acetaminophen (TYLENOL) 500 MG tablet Take 250 mg by mouth daily as needed. Pain     [provider]  allopurinol (ZYLOPRIM) 100 MG tablet Take 100 mg by mouth daily. Per patient taking 2 tablets (200 mg)    [provider]  ALPRAZolam (XANAX) 0.25 MG tablet TAKE 1/2 TO 1 TABLET (0.125-0.25 MG TOTAL) BY MOUTH AT BEDTIME AS NEEDED FOR ANXIETY OR SLEEP. Patient taking differently: 0.125 mg 2 (two) times daily as needed for anxiety or sleep. 11/25/20   Ria Bush, MD  alum & mag hydroxide-simeth (MAALOX/MYLANTA) 200-200-20 MG/5ML suspension Take 15 mLs by mouth daily as needed for indigestion or heartburn.    [provider]  Azelastine HCl 137 MCG/SPRAY SOLN Place 1 spray into both nostrils 2 (two) times daily. 11/27/20   [provider]  carvedilol (COREG) 3.125 MG tablet Take 1 tablet (3.125 mg total) by mouth 2 (two) times daily with a meal. 12/10/20   Cherene Altes, MD  fluticasone (FLONASE) 50 MCG/ACT nasal spray Place 1 spray into both nostrils daily.    [provider]  metolazone (ZAROXOLYN) 2.5 MG tablet metolazone 2.'5mg'$   daily for weight < 140lbs metolazone 5 mg for weight 140lbs or > 12/10/20   Cherene Altes, MD  montelukast (SINGULAIR) 10 MG tablet Take 10 mg by mouth at bedtime.    [provider]  polyethylene glycol (MIRALAX / GLYCOLAX) packet Take 17 g by mouth daily as needed (constipation).  Patient not taking: No sig reported  [provider]  potassium chloride SA (KLOR-CON) 20 MEQ tablet Take 2 tablets (40 mEq total) by mouth 2 (two) times daily. 12/10/20   Cherene Altes, MD  predniSONE (DELTASONE) 5 MG tablet Take 2 tablets (10 mg total) by mouth daily with breakfast. 12/10/20   Cherene Altes, MD  tamsulosin (FLOMAX) 0.4 MG CAPS capsule Take 2 capsules (0.8 mg total) by mouth daily after supper. 07/06/20   Billey Co, MD  torsemide 40 MG TABS torsemide 40 twice daily for weight 132 - 135lbs torsemide 60 twice daily for weight 136 - 139lbs torsemide 60 twice daily along with metolazone '5mg'$  daily for weight 140lbs or > 12/10/20   Cherene Altes, MD  vitamin B-12 (CYANOCOBALAMIN) 500 MCG tablet Take 2 tablets (1,000 mcg total) by mouth daily. 11/16/19   Ria Bush, MD  warfarin (COUMADIN) 5 MG tablet TAKE 1/2 TO 1 TABLET BY MOUTH DAILY AS DIRECTED BY COUMADIN CLINIC Patient taking differently: Take 5 mg by mouth 2 (two) times a week. Mondays and Fridays 12/30/19   Evans Lance, MD  warfarin (COUMADIN) 5 MG tablet Take 2.5 mg by mouth as directed. 5 days weekly. All days except Monday and Friday    [provider]    Physical Exam: Vitals:   12/13/20 0845 12/13/20 0900 12/13/20 0930 12/13/20 1000  BP:  111/63 129/86 100/82  Pulse: 63 63 62 67  Resp: 19 (!) 33 (!) 24 19  Temp:      TempSrc:      SpO2: 100% 98% 90% 95%  Weight:      Height:         Vitals:   12/13/20 0845 12/13/20 0900 12/13/20 0930 12/13/20 1000  BP:  111/63 129/86 100/82  Pulse: 63 63 62 67  Resp: 19 (!) 33 (!) 24 19  Temp:      TempSrc:      SpO2: 100% 98% 90% 95%   Weight:      Height:          Constitutional: Alert and oriented x 3 . Not in any apparent distress. Irritable HEENT:      Head: Normocephalic and atraumatic.         Eyes: PERLA, EOMI, Conjunctivae are normal. Sclera is non-icteric.       Mouth/Throat: Mucous membranes are dry.       Neck: Supple with no signs of meningismus. Cardiovascular: Regular rate and rhythm. No murmurs, gallops, or rubs. 2+ symmetrical distal pulses are present . No JVD. 2+ LE edema Respiratory: Tachypneic.bilateral air entry in both lung fields bilaterally. No wheezes, crackles, or rhonchi.  Gastrointestinal: Soft, non tender, and non distended with positive bowel sounds.  Genitourinary: No CVA tenderness. Musculoskeletal: Nontender with normal range of motion in all extremities. No cyanosis, or erythema of extremities. Neurologic:  Face is symmetric. Moving all extremities. No gross focal neurologic deficits .  Generalized weakness Skin: Skin is warm, dry.  No rash or ulcers Psychiatric: Mood and affect are normal    Labs on Admission: I have personally reviewed following labs and imaging studies  CBC: Recent Labs  Lab 12/09/20 0441 12/10/20 0439 12/13/20 0802  WBC 7.3 6.9 10.5  NEUTROABS  --   --  8.2*  HGB 11.7* 12.1* 14.1  HCT 33.8* 35.3* 42.5  MCV 88.5 87.8 88.7  PLT 107* 112* [id number]*   Basic Metabolic Panel: Recent Labs  Lab 12/07/20 0412 12/08/20 0536 12/09/20 0441 12/10/20 0439 12/13/20 0802  NA  131* 132* 132* 136 129*  K 4.0 3.4* 3.4* 3.9 5.5*  CL 97* 96* 95* 97* 88*  CO2 '25 28 28 '$ 32 25  GLUCOSE 120* 115* 113* 94 114*  BUN 36* 36* 36* 40* 51*  CREATININE 1.54* 1.39* 1.53* 1.71* 2.22*  CALCIUM 8.6* 8.5* 8.7* 8.8* 9.4  MG  --   --  2.3  --   --    GFR: Estimated Creatinine Clearance: 18.7 mL/min (A) (by C-G formula based on SCr of 2.22 mg/dL (H)). Liver Function Tests: Recent Labs  Lab 12/13/20 0802  AST 432*  ALT 361*  ALKPHOS 107  BILITOT 2.7*  PROT 6.4*  ALBUMIN 3.9    No results for input(s): LIPASE, AMYLASE in the last 168 hours. No results for input(s): AMMONIA in the last 168 hours. Coagulation Profile: Recent Labs  Lab 12/07/20 0412 12/08/20 0536 12/09/20 0441 12/10/20 0439 12/13/20 0802  INR 1.6* 1.9* 2.2* 2.0* 2.5*   Cardiac Enzymes: No results for input(s): CKTOTAL, CKMB, CKMBINDEX, TROPONINI in the last 168 hours. BNP (last 3 results) Recent Labs    09/01/20 1155  PROBNP 22,792*   HbA1C: No results for input(s): HGBA1C in the last 72 hours. CBG: No results for input(s): GLUCAP in the last 168 hours. Lipid Profile: No results for input(s): CHOL, HDL, LDLCALC, TRIG, CHOLHDL, LDLDIRECT in the last 72 hours. Thyroid Function Tests: No results for input(s): TSH, T4TOTAL, FREET4, T3FREE, THYROIDAB in the last 72 hours. Anemia Panel: No results for input(s): VITAMINB12, FOLATE, FERRITIN, TIBC, IRON, RETICCTPCT in the last 72 hours. Urine analysis:    Component Value Date/Time   COLORURINE ORANGE (A) 06/17/2020 1646   APPEARANCEUR HAZY (A) 06/17/2020 1646   APPEARANCEUR Clear 07/06/2019 1046   LABSPEC 1.015 06/17/2020 1646   LABSPEC 1.010 08/20/2013 1220   PHURINE  06/17/2020 1646    TEST NOT REPORTED DUE TO COLOR INTERFERENCE OF URINE PIGMENT   GLUCOSEU (A) 06/17/2020 1646    TEST NOT REPORTED DUE TO COLOR INTERFERENCE OF URINE PIGMENT   GLUCOSEU Negative 08/20/2013 1220   HGBUR (A) 06/17/2020 1646    TEST NOT REPORTED DUE TO COLOR INTERFERENCE OF URINE PIGMENT   BILIRUBINUR (A) 06/17/2020 1646    TEST NOT REPORTED DUE TO COLOR INTERFERENCE OF URINE PIGMENT   BILIRUBINUR Negative 07/06/2019 1046   BILIRUBINUR Negative 08/20/2013 1220   KETONESUR (A) 06/17/2020 1646    TEST NOT REPORTED DUE TO COLOR INTERFERENCE OF URINE PIGMENT   PROTEINUR (A) 06/17/2020 1646    TEST NOT REPORTED DUE TO COLOR INTERFERENCE OF URINE PIGMENT   UROBILINOGEN 0.2 08/17/2013 1019   NITRITE (A) 06/17/2020 1646    TEST NOT REPORTED DUE TO COLOR  INTERFERENCE OF URINE PIGMENT   LEUKOCYTESUR (A) 06/17/2020 1646    TEST NOT REPORTED DUE TO COLOR INTERFERENCE OF URINE PIGMENT   LEUKOCYTESUR Negative 08/20/2013 1220    Radiological Exams on Admission: DG Chest Portable 1 View  Result Date: 12/13/2020 CLINICAL DATA:  87 year old male with nausea and weakness. EXAM: PORTABLE CHEST 1 VIEW COMPARISON:  Chest radiographs 12/04/2020 and earlier. FINDINGS: Portable AP upright view at 0737 hours. Stable cardiomegaly and mediastinal contours. Visualized tracheal air column is within normal limits. Stable left chest AICD. A small right pleural effusion appears increased since March, stable since last month. No pneumothorax or pulmonary edema. No consolidation or areas of worsening ventilation. Previous right shoulder arthroplasty. No acute osseous abnormality identified. IMPRESSION: 1. Chronic cardiomegaly. Small pleural effusion(s) stable from last month. 2. No  new cardiopulmonary abnormality. Electronically Signed   By: Genevie Ann M.D.   On: 12/13/2020 07:59   US Abdomen Limited RUQ (LIVER/GB)  Result Date: 12/13/2020 CLINICAL DATA:  Elevated LFTs. EXAM: ULTRASOUND ABDOMEN LIMITED RIGHT UPPER QUADRANT COMPARISON:  None. FINDINGS: Gallbladder: Surgically absent. Common bile duct: Diameter: 3 mm Liver: No focal lesion identified. Within normal limits in parenchymal echogenicity. Mildly nodular contour. Portal vein is patent on color Doppler imaging with normal direction of blood flow towards the liver. Other: Small amount of right upper quadrant ascites IMPRESSION: Small amount of right upper quadrant ascites. Mildly nodular liver contour, findings are suggestive of cirrhosis. Electronically Signed   By: Yetta Glassman M.D.   On: 12/13/2020 09:38     Assessment/Plan Principal Problem:   Dehydration with hyponatremia Active Problems:   Anxiety   NICM (nonischemic cardiomyopathy) (HCC)   Chronic systolic heart failure (HCC)   Chronic constipation    Chronic atrial fibrillation (HCC)   HTN (hypertension)   CKD (chronic kidney disease), stage IIIa   Hyponatremia   Hyperkalemia   Generalized weakness   Lactic acidosis   Transaminitis     Patient is an 87 year old Caucasian male who was recently discharged from the hospital after treatment for acute on chronic systolic CHF.  He returns to the emergency room for evaluation of nausea, diarrhea and weakness and is found to have multiple lab abnormalities.     Dehydration with hyponatremia Secondary to diuresis as well as GI losses from diarrhea. Hold all diuretics for now Hold laxatives Encourage oral fluid intake    Lactic acidosis Secondary to diarrhea with no evidence of sepsis at this time    Chronic systolic heart failure Patient's last 2D echo from 08/22 shows an LVEF of 20 to 25% Hold metolazone, torsemide and carvedilol due to relative hypotension     Acute hypoxic respiratory failure  Most likely secondary to known CHF as well as underlying chronic lung disease Patient is currently on 2 L of oxygen via nasal cannula Will need to be assessed for home oxygen need prior to discharge    Hypotension Secondary to overdiuresis as well as GI losses from diarrhea Hold carvedilol and diuretics for now    Permanent atrial fibrillation Rate controlled  Patient on chronic anticoagulation therapy with Coumadin and INR is within therapeutic limits    Hyperkalemia Iatrogenic following potassium supplementation Place patient on Lokelma    Acute kidney injury on CKD stage III AAA Creatinine continues to wax and wane  peaked at 1.72 during his last hospital stay On admission today it is 2.2, will hold off on IV fluid hydration since patient is at high risk of developing symptoms of fluid overload.     Transaminitis Unclear etiology and most likely secondary to hepatic congestion from known history of CHF Abdominal ultrasound shows findings suggestive of  cirrhosis    Normocytic anemia anemia panel unrevealing -no evidence of gross blood loss     Immunocompromise state Patient is on chronic steroid therapy due to gout        DVT prophylaxis: Coumadin Code Status: DO NOT RESUSCITATE Family Communication: Greater than 50% of time was spent discussing patient's condition and plan of care with him and his family at the bedside.  CODE STATUS was discussed and he is a DO NOT RESUSCITATE. Disposition Plan: Back to previous home environment Consults called: Cardiology Status: Observation    Jem Castro MD [facility]     12/13/2020, 10:03 AM

## 2020-12-13 NOTE — Consult Note (Signed)
Cardiology Consultation:   Patient ID: [name] MRN: [id number]; DOB: 1931/05/28  Admit date: 12/13/2020 Date of Consult: 12/13/2020  PCP:  Ria Bush, [facility]  Cardiologist:  [name], MD  Advanced Practice Provider:  No care team member to display Electrophysiologist:  Cristopher Peru, MD [id number]}    Patient Profile:   [name] is a 87 y.o. male with a hx of nonischemic cardiomyopathy with EF 20 to 25%, pulmonary hypertension, AICD, atrial flutter, GERD, and who is being seen today for the evaluation of HFrEF at the request of Dr. Francine Graven.  History of Present Illness:   [name] is an 87 year old male with PMH as above and including nonischemic cardiomyopathy with last EF 20 to 25%, s/p AICD, atrial flutter, GERD, pulmonary hypertension.  He was recently admitted with worsening shortness of breath and IV diuresed with improvement in symptoms.  He was discharged home on torsemide 60 mg twice daily, metolazone 2.5 mg daily, Coreg 3.125 mg twice daily.  He was continued on warfarin for permanent atrial fibrillation.  Recommendation was for discharge home and close monitoring/follow-up.  On 12/12/2020, the clinic was called with report that the patient was dealing with sinus congestion.  It was noted his breathing was improving and weight decreasing.  Today 12/13/2020 who presents to the [facility] ED with nausea and weakness.  Most of the below HPI was provided by family, given he has received medications for nausea and is quite fatigued today.  The family reports that he was mildly nauseated before discharge at his previous admission, worsening since that time. He was able to eat until 12/12/2020, at which time he stopped eating.  He is weak / fatigued today, as he was awake all night and using the restroom after taking MiraLAX and subsequently having loose stools.  No reported emesis, though the family report gagging noises/coughing  noises. No reported chest pain. Per family, his volume status had been significantly improved since discharge. Yesterday, the family wondered if his potassium supplementation pills were causing his nausea and thus did not give him any potassium supplementation.  With ongoing/ worsening nausea, he was brought to the ED.  Of note, he did not report abdominal pain to his family, though at the time of cardiology consultation, he is very tender to palpation during the abdominal exam.  In the ED, sodium 129, potassium 5.5, glucose 114, creatinine 2.22, BUN 51, AST 432, ALT 361, high-sensitivity troponin 9/11, calcium 9.4, alk phos 107, albumin 3.9, AST 432, ALT 361, BNP 2212.4, lactic acid 5.4, WBC 10.2, hemoglobin 14.1, hematocrit 42.5, platelets 142, INR 2.5.  Chest x-ray with small and stable pleural effusions.  Liver ultrasound showed RUQ ascites and possible cirrhosis.  EKG showed v paced rhythm.  Due to worsening renal function, his diuretics were held.   Past Medical History:  Diagnosis Date   AICD (automatic cardioverter/defibrillator) present    s/p gen change 04/2015 w/ MDT [name] Crt-D  DTBA1D1, Serial number [id number]   Allergic rhinitis    [name]   Anemia    Anxiety    Arthritis    Atrial flutter ([place])    A.  Status post cardioversion; B.  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 Lucky Cowboy)   Chronic lung disease  spirometry 2015 - no obstruction, + mild restrictive lung disease   Chronic sinusitis    Chronic systolic congestive heart failure (HCC)    Dyspnea    with exertion   Extrinsic asthma    [name]   Fall with injury 05/28/2017   GERD (gastroesophageal reflux disease) 2010   h/o esophageal stricture with dilation, LA grade C reflux esophagitis by EGD 2010   Goiter    History of diverticulitis of colon 06/2020    History of GI bleed    Secondary to hemorrhoids   History of seasonal allergies    Hypertension    IBS (irritable bowel syndrome)    LBBB (left bundle branch block)    Multiple pulmonary nodules 06/2013   RUL ([facility]. Adam Phenix [facility]) - ?vasculitis as of last CT at [facility]   NICM (nonischemic cardiomyopathy) [facility])    Cardiac catheterization March 2006 without coronary disease; EF 25% 2018 Jan   Orthopnea    [facility])    Presence of permanent cardiac pacemaker    Sinus congestion 09/25/2010    Past Surgical History:  Procedure Laterality Date   BACK SURGERY  1997   bulging disks   BiV-AICD implant     [employer]   CARDIAC CATHETERIZATION  06/22/04   Severe, nonischemic cardiomyopathy,EF 25-30%   CARDIOVERSION  02/22/09   AFlutter-([facility])   CATARACT EXTRACTION W/PHACO Right 12/21/2019   Procedure: CATARACT EXTRACTION PHACO AND INTRAOCULAR LENS PLACEMENT (IOC) RIGHT 2.23  00:21.9;  Surgeon: Eulogio Bear, MD;  Location: [place];  Service: Ophthalmology;  Laterality: Right;   CATARACT EXTRACTION W/PHACO Left 02/01/2020   Procedure: CATARACT EXTRACTION PHACO AND INTRAOCULAR LENS PLACEMENT (IOC) LEFT 3.67 00:39.5;  Surgeon: Eulogio Bear, MD;  Location: [place];  Service: Ophthalmology;  Laterality: Left;   CHOLECYSTECTOMY     COLONOSCOPY  10/99   Divertic, splenic, hepatic fleure only   COLONOSCOPY  10/21/08   aborted-divertics, int hemms (Dr. Vira Agar)   CORONARY ANGIOPLASTY     EP IMPLANTABLE DEVICE N/A 04/25/2015   Procedure: BIV ICD Generator Changeout;  Surgeon: Evans Lance, MD;  Location: [place] [facility];  Service: Cardiovascular;  Laterality: N/A;   ESOPHAGEAL DILATION  02/18/98   ESOPHAGOGASTRODUODENOSCOPY  01/1998   stricture, sliding HH, GERD   ESOPHAGOGASTRODUODENOSCOPY  04/08/01   stricture, gastritis, HH, GERD-no dilation(Dr. Henrene Pastor)   ESOPHAGOGASTRODUODENOSCOPY  12/22/04   stricture; gastritis; duodenitis, GERD    ESOPHAGOGASTRODUODENOSCOPY  10/21/08   Reflux esophagitis; Erythem. Duod. (Dr. Vira Agar)   ESOPHAGOGASTRODUODENOSCOPY  08/2013   erythema gastric fundus - minimal gastritis, HH ([place])   ESOPHAGOGASTRODUODENOSCOPY  08/2016   dysphagia - mod schatzki rin, dilated Tiffany Kocher)   ESOPHAGOGASTRODUODENOSCOPY (EGD) WITH PROPOFOL N/A 08/24/2016   mod schatzki ring dilated, duodenal deformity Manya Silvas, MD)   goiter removal     HERNIA REPAIR     HERNIA REPAIR  01/30/02   Dr. Smith Mince / REPLACE / REMOVE PACEMAKER  2007 and 2017   JOINT REPLACEMENT Right    shoulder   NOSE SURGERY  1971   sinus surgery   PENILE PROSTHESIS IMPLANT  2007   [place]   PFT  10/2013   FVC 61%, FEV1 63%, ratio 0.76   Pulmonary eval  04/2002   ([facility]) Chronic congestive symptoms   REVERSE SHOULDER ARTHROPLASTY Right 01/17/2016   Procedure: REVERSE SHOULDER ARTHROPLASTY;  Surgeon: Corky Mull, MD;  Location: [facility];  Service: Orthopedics;  Laterality: Right;  REVERSE TOTAL SHOULDER ARTHROPLASTY Right 01/2016   [place]   US ECHOCARDIOGRAPHY  07/13/04   EF 25-30%, Mod LVH; LA severe dilation; Mild AR; IRTR   US ECHOCARDIOGRAPHY  11/27/06   hypokinesis posterior wall, EF 35%; mild AR     Home Medications:  Prior to Admission medications   Medication Sig Start Date End Date Taking? Authorizing Provider  allopurinol (ZYLOPRIM) 100 MG tablet Take 100 mg by mouth daily. Per patient taking 2 tablets (200 mg)   Yes [provider]  ALPRAZolam (XANAX) 0.25 MG tablet TAKE 1/2 TO 1 TABLET (0.125-0.25 MG TOTAL) BY MOUTH AT BEDTIME AS NEEDED FOR ANXIETY OR SLEEP. Patient taking differently: 0.125 mg 2 (two) times daily as needed for anxiety or sleep. 11/25/20  Yes Ria Bush, MD  Azelastine HCl 137 MCG/SPRAY SOLN Place 1 spray into both nostrils 2 (two) times daily. 11/27/20  Yes [provider]  carvedilol (COREG) 3.125 MG tablet Take 1 tablet (3.125 mg total) by mouth 2 (two) times daily with a meal.  12/10/20  Yes Cherene Altes, MD  fluticasone (FLONASE) 50 MCG/ACT nasal spray Place 1 spray into both nostrils daily.   Yes [provider]  montelukast (SINGULAIR) 10 MG tablet Take 10 mg by mouth at bedtime.   Yes [provider]  predniSONE (DELTASONE) 5 MG tablet Take 2 tablets (10 mg total) by mouth daily with breakfast. 12/10/20  Yes Cherene Altes, MD  tamsulosin (FLOMAX) 0.4 MG CAPS capsule Take 2 capsules (0.8 mg total) by mouth daily after supper. 07/06/20  Yes Billey Co, MD  torsemide 40 MG TABS torsemide 40 twice daily for weight 132 - 135lbs torsemide 60 twice daily for weight 136 - 139lbs torsemide 60 twice daily along with metolazone 31m daily for weight 140lbs or > 12/10/20  Yes MCherene Altes MD  vitamin B-12 (CYANOCOBALAMIN) 500 MCG tablet Take 2 tablets (1,000 mcg total) by mouth daily. 11/16/19  Yes GRia Bush MD  warfarin (COUMADIN) 5 MG tablet TAKE 1/2 TO 1 TABLET BY MOUTH DAILY AS DIRECTED BY COUMADIN CLINIC Patient taking differently: Take 5 mg by mouth 2 (two) times a week. Mondays and Fridays 12/30/19  Yes TEvans Lance MD  acetaminophen (TYLENOL) 500 MG tablet Take 250 mg by mouth daily as needed. Pain     [provider]  alum & mag hydroxide-simeth (MAALOX/MYLANTA) 200-200-20 MG/5ML suspension Take 15 mLs by mouth daily as needed for indigestion or heartburn.    [provider]  metolazone (ZAROXOLYN) 2.5 MG tablet metolazone 2.546mdaily for weight < 140lbs metolazone 5 mg for weight 140lbs or > 12/10/20   McCherene AltesMD  polyethylene glycol (MIRALAX / GLYCOLAX) packet Take 17 g by mouth daily as needed (constipation).  Patient not taking: No sig reported    [provider]  potassium chloride SA (KLOR-CON) 20 MEQ tablet Take 2 tablets (40 mEq total) by mouth 2 (two) times daily. 12/10/20   McCherene AltesMD  warfarin (COUMADIN) 5 MG tablet Take 2.5 mg by mouth as directed. 5 days weekly. All  days except Monday and Friday Patient not taking: No sig reported    [provider]    Inpatient Medications: Scheduled Meds:  [START ON 12/14/2020] allopurinol  100 mg Oral Daily   azelastine  1 spray Each Nare BID   [START ON 12/14/2020] fluticasone  1 spray Each Nare Daily   montelukast  10 mg Oral QHS   [START ON 12/14/2020]  predniSONE  10 mg Oral Q breakfast   sodium chloride flush  3 mL Intravenous Q12H   sodium zirconium cyclosilicate  10 g Oral Daily   tamsulosin  0.8 mg Oral QPC supper   [START ON 12/14/2020] vitamin B-12  1,000 mcg Oral Daily   warfarin  1.5 mg Oral ONCE-1600   Warfarin - Pharmacist Dosing Inpatient   Does not apply q1600   Continuous Infusions:  sodium chloride     PRN Meds: sodium chloride, ALPRAZolam, ondansetron **OR** ondansetron (ZOFRAN) IV, sodium chloride flush  Allergies:    Allergies  Allergen Reactions   Carafate [Sucralfate] Other (See Comments)    Burns stomach   Clarithromycin Nausea Only   Famotidine Other (See Comments)    ABD. PAIN   Levaquin [Levofloxacin] Other (See Comments)    Stomach pain, has to eat a lot of food   Omeprazole Diarrhea   Oxytetracycline Other (See Comments)    BUMPS   Penicillins Swelling    Amoxicillin ok, Pt states he had to have a shot to reverse the reaction Has patient had a PCN reaction causing immediate rash, facial/tongue/throat swelling, SOB or lightheadedness with hypotension: Yes Has patient had a PCN reaction causing severe rash involving mucus membranes or skin necrosis: No Has patient had a PCN reaction that required hospitalization No Has patient had a PCN reaction occurring within the last 10 years: Yes If all of the above answers are "NO", then may proceed with   Proton Pump Inhibitors Other (See Comments)    GI upset, diarrhea, gas, bloating   Ranitidine Diarrhea   Doxycycline Rash    Social History:   Social History   Socioeconomic History   Marital status: Married    Spouse  name: Not on file   Number of children: 3   Years of education: Not on file   Highest education level: Not on file  Occupational History   Occupation: retired    Fish farm manager: RETIRED  Tobacco Use   Smoking status: Never   Smokeless tobacco: Never  Vaping Use   Vaping Use: Never used  Substance and Sexual Activity   Alcohol use: No   Drug use: No   Sexual activity: Yes  Other Topics Concern   Not on file  Social History Narrative   Lives with wife   3 children-8 grandchildren   Still works on a farm.   Retired from [employer]   Had [employer]   Good friend with Dr. Jefm Bryant   Activity: No regular exercise   Diet: good water, fruits/vegetables daily   Social Determinants of Health   Financial Resource Strain: Not on file  Food Insecurity: Not on file  Transportation Needs: Not on file  Physical Activity: Not on file  Stress: Not on file  Social Connections: Not on file  Intimate Partner Violence: Not on file    Family History:    Family History  Problem Relation Age of Onset   Coronary artery disease Father    Hypertension Father    Heart failure Mother    Dysphagia Sister    Breast cancer Other    Ovarian cancer Other    Uterine cancer Other    Other Sister        stomach problems   Other Sister        stomach problems   Other Sister        stomach problems   Alcohol abuse Maternal Uncle      ROS:  Please see  the history of present illness.  Review of Systems  Constitutional:  Positive for malaise/fatigue.  Respiratory:  Positive for cough and sputum production.        Coughing/gagging, attributed to nausea  Cardiovascular:  Positive for leg swelling. Negative for chest pain.       Leg swelling improved per family report  Gastrointestinal:  Positive for abdominal pain, diarrhea and nausea. Negative for vomiting.       No reported abdominal pain but then TTP during abdominal exam  Neurological:  Positive for weakness.  All other systems reviewed  and are negative.  All other ROS reviewed and negative.     Physical Exam/Data:   Vitals:   12/13/20 1345 12/13/20 1400 12/13/20 1415 12/13/20 1430  BP:  (!) 106/57  (!) 106/57  Pulse:  63  (!) 59  Resp:    19  Temp:      TempSrc:      SpO2: 97%  93% 96%  Weight:      Height:        Intake/Output Summary (Last 24 hours) at 12/13/2020 1444 Last data filed at 12/13/2020 1111 Gross per 24 hour  Intake 483.34 ml  Output --  Net 483.34 ml   Last 3 Weights 12/13/2020 12/10/2020 12/09/2020  Weight (lbs) 124 lb 6.4 oz 137 lb 2 oz 141 lb 8.6 oz  Weight (kg) 56.427 kg 62.2 kg 64.2 kg     Body mass index is 21.35 kg/m.  General: Elderly male, fatigued, no acute distress HEENT: normal Neck: no JVD Vascular: Radial pulses 2+ bilaterally Cardiac: Soft heart sounds, normal S1, S2; RRR; no murmur  Lungs:  clear to auscultation bilaterally with mildly reduced bibasilar breath sounds, no wheezing, rhonchi or rales  Abd: soft, tender to palpation Ext: 2+ bilateral lower extremity edema Musculoskeletal:  No deformities Skin: warm and dry  Neuro:  no focal abnormalities noted Psych:  Normal affect, fatigued  EKG:  The EKG was personally reviewed and demonstrates: Ventricular paced rhythm Telemetry:  Telemetry was personally reviewed and demonstrates: Ventricular paced rhythm  Relevant CV Studies: TTE 12/06/2020 1. Left ventricular ejection fraction, by estimation, is 20 to 25%. The  left ventricle has severely decreased function. The left ventricle  demonstrates global hypokinesis. Left ventricular diastolic parameters are  indeterminate. The average left  ventricular global longitudinal strain is -9.0 %. The global longitudinal  strain is abnormal.   2. Right ventricular systolic function is moderately reduced. The right  ventricular size is normal. There is mildly elevated pulmonary artery  systolic pressure.   3. Left atrial size was severely dilated.   4. Right atrial size was  moderately dilated.   5. The mitral valve is abnormal. Mild mitral valve regurgitation.   6. The aortic valve was not well visualized. Aortic valve regurgitation  is mild. No aortic stenosis is present.   7. The inferior vena cava is normal in size with <50% respiratory  variability, suggesting right atrial pressure of 8 mmHg.     Laboratory Data:  High Sensitivity Troponin:   Recent Labs  Lab 12/04/20 1956 12/04/20 2242 12/13/20 0802 12/13/20 0953  TROPONINIHS _0 Chemistry Recent Labs  Lab 12/09/20 0441 12/10/20 0439 12/13/20 0802  NA 132* 136 129*  K 3.4* 3.9 5.5*  CL 95* 97* 88*  CO2 28 32 25  GLUCOSE 113* 94 114*  BUN 36* 40* 51*  CREATININE 1.53* 1.71* 2.22*  CALCIUM 8.7* 8.8* 9.4  GFRNONAA  44* 38* 28*  ANIONGAP 9 7 16*    Recent Labs  Lab 12/13/20 0802  PROT 6.4*  ALBUMIN 3.9  AST 432*  ALT 361*  ALKPHOS 107  BILITOT 2.7*   Hematology Recent Labs  Lab 12/09/20 0441 12/10/20 0439 12/13/20 0802  WBC 7.3 6.9 10.5  RBC 3.82* 4.02*  3.88* 4.79  HGB 11.7* 12.1* 14.1  HCT 33.8* 35.3* 42.5  MCV 88.5 87.8 88.7  MCH 30.6 30.1 29.4  MCHC 34.6 34.3 33.2  RDW 18.1* 18.2* 18.3*  PLT 107* 112* 142*   BNP Recent Labs  Lab 12/13/20 0802  BNP 2,212.4*    DDimer No results for input(s): DDIMER in the last 168 hours.   Radiology/Studies:  DG Chest Portable 1 View  Result Date: 12/13/2020 CLINICAL DATA:  87 year old male with nausea and weakness. EXAM: PORTABLE CHEST 1 VIEW COMPARISON:  Chest radiographs 12/04/2020 and earlier. FINDINGS: Portable AP upright view at 0737 hours. Stable cardiomegaly and mediastinal contours. Visualized tracheal air column is within normal limits. Stable left chest AICD. A small right pleural effusion appears increased since March, stable since last month. No pneumothorax or pulmonary edema. No consolidation or areas of worsening ventilation. Previous right shoulder arthroplasty. No acute osseous abnormality  identified. IMPRESSION: 1. Chronic cardiomegaly. Small pleural effusion(s) stable from last month. 2. No new cardiopulmonary abnormality. Electronically Signed   By: Genevie Ann M.D.   On: 12/13/2020 07:59   US Abdomen Limited RUQ (LIVER/GB)  Result Date: 12/13/2020 CLINICAL DATA:  Elevated LFTs. EXAM: ULTRASOUND ABDOMEN LIMITED RIGHT UPPER QUADRANT COMPARISON:  None. FINDINGS: Gallbladder: Surgically absent. Common bile duct: Diameter: 3 mm Liver: No focal lesion identified. Within normal limits in parenchymal echogenicity. Mildly nodular contour. Portal vein is patent on color Doppler imaging with normal direction of blood flow towards the liver. Other: Small amount of right upper quadrant ascites IMPRESSION: Small amount of right upper quadrant ascites. Mildly nodular liver contour, findings are suggestive of cirrhosis. Electronically Signed   By: Yetta Glassman M.D.   On: 12/13/2020 09:38     Assessment and Plan:   Nonischemic cardiomyopathy, EF 20 to 25% --Presents with progressive nausea and recent loose stools.  Recent admission for volume overload with discharge on torsemide 60 mg twice daily, metolazone 2.5 mg daily, carvedilol 3.125 mg twice daily. CXR with small stable b/l effusions. Bilateral lower extremity edema noted on exam though per family reported as improved from baseline.  Suspect worsening renal function in the setting of dehydration given recent GI illness/possible cirrhosis with recent loose stools and poor oral intake after recently discharged with increased home diuresis regimen.  Continue to hold diuresis at this time given AKI / hypotension and until improvement in renal function and room in pressure.  Continue to hold beta-blocker due to bradycardia/hypotension.  Work-up of GI issue/cirrhosis per IM/GI.  Daily BMET.  Maintain electrolytes at goal.  Continue to monitor vitals closely.  Permanent atrial fibrillation --Rates currently controlled to bradycardic.  Continue to hold  beta-blocker.  Continue current anticoagulation.  Daily CBC.  Daily BMET.  Maintain electrolytes at goal.  Check TSH.  Continue to monitor on telemetry.  S/p AICD --V paced rhythm.  Possible cirrhosis --Per IM/GI.  Consider as etiology of abdominal pain.  For questions or updates, please contact [place] Please consult [email or web address] for contact info under    Signed, Arvil Chaco, PA-C  12/13/2020 2:44 PM

## 2020-12-13 NOTE — ED Notes (Signed)
Pt placed on hospital bed for comfort while waiting for inpt room. Family at bedside

## 2020-12-13 NOTE — ED Provider Notes (Signed)
[facility] Emergency Department Provider Note  ____________________________________________   Event Date/Time   First MD Initiated Contact with Patient 12/13/20 0730     (approximate)  I have reviewed the triage vital signs and the nursing notes.   HISTORY  Chief Complaint Weakness    HPI [name] is a 87 y.o. male who was recently in the hospital with CHF.  He comes in complaining of being up all night using the bathroom because of the MiraLAX he was given.  Says he has not been able to pee this morning.  He does not think he has any urine in his bladder.  He is nauseated and was worse this morning.  He denies any abdominal pain or any pain elsewhere.  He is not short of breath.  O2 sats were 93 but is gone up to 95/96 . the nurses working to put an IV in him.        Past Medical History:  Diagnosis Date   AICD (automatic cardioverter/defibrillator) present    s/p gen change 04/2015 w/ MDT [name] Crt-D  DTBA1D1, Serial number [id number]   Allergic rhinitis    [name]   Anemia    Anxiety    Arthritis    Atrial flutter ([place])    A.  Status post cardioversion; B.  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 Lucky Cowboy)   Chronic lung disease    spirometry 2015 - no obstruction, + mild restrictive lung disease   Chronic sinusitis    Chronic systolic congestive heart failure ([place])    Dyspnea    with exertion   Extrinsic asthma    [name]   Fall with injury 05/28/2017   GERD (gastroesophageal reflux disease) 2010   h/o esophageal stricture with dilation, LA grade C reflux esophagitis by EGD 2010   Goiter    History of diverticulitis of colon 06/2020   History of GI bleed    Secondary to hemorrhoids   History of seasonal allergies    Hypertension    IBS (irritable bowel  syndrome)    LBBB (left bundle branch block)    Multiple pulmonary nodules 06/2013   RUL (Dr. Adam Phenix at [facility]) - ?vasculitis as of last CT at [facility]   NICM (nonischemic cardiomyopathy) [place])    Cardiac catheterization March 2006 without coronary disease; EF 25% 2018 Jan   Orthopnea    [facility])    Presence of permanent cardiac pacemaker    Sinus congestion 09/25/2010    Patient Active Problem List   Diagnosis Date Noted   Generalized weakness 12/13/2020   Lactic acidosis    Transaminitis    Dehydration with hyponatremia    Acute CHF (congestive heart failure) ([place]) 12/05/2020   HTN (hypertension) 12/05/2020   Asthma 12/05/2020   Gout 12/05/2020   Acute on chronic systolic CHF (congestive heart failure) ([place]) 12/05/2020   CKD (chronic kidney disease), stage IIIa 12/05/2020   Hyponatremia 12/05/2020   Hyperkalemia 12/05/2020   Acute on chronic HFrEF (heart failure with reduced ejection fraction) ([place])    Chronic atrial fibrillation ([place]) 02/16/2020   Prediabetes 11/22/2019   Pedal edema 11/16/2019   Decreased visual acuity 11/16/2019   Low back pain radiating to right lower extremity 07/09/2019   Health maintenance examination 11/11/2018  Chronic lung disease    Nausea 11/04/2017   Abnormal x-ray 10/28/2017   Pulmonary HTN ([place]) 03/25/2017   Aortic valve regurgitation 03/25/2017   Other fatigue 12/04/2016   Nondisplaced fracture of greater trochanter of left femur ([place]) 10/30/2016   Status post reverse total arthroplasty of right shoulder 01/17/2016   Complete tear of right rotator cuff 12/10/2015   Acute sinusitis 03/18/2015   Complete tear of left rotator cuff 05/07/2014   Warfarin anticoagulation 05/07/2014   Advanced care planning/counseling discussion 03/16/2014   Chronic constipation 09/09/2013   Hyperglycemia 09/09/2013   Medicare annual wellness visit, subsequent 04/22/2012   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[place]) 10/08/2011   Left sided sciatica  06/11/2011   Thrombocytopenia ([place]) 02/25/2011   Vitamin B12 deficiency 02/22/2011   Congestion of respiratory tract 09/25/2010   Long term current use of anticoagulant 06/27/2010   ESOPHAGEAL STRICTURE 02/16/2010   CHEST PAIN 01/03/2010   Atrial flutter ([place]) 12/20/2008   Anxiety 12/08/2008   NICM (nonischemic cardiomyopathy) ([place]) 09/13/2008   LBBB 09/13/2008   Automatic implantable cardioverter-defibrillator in situ 09/13/2008   PORPHYRIA [id number]   Chronic systolic heart failure ([place]) 12/16/2006   Allergic rhinitis 12/16/2006   GERD 12/16/2006   BPH (benign prostatic hyperplasia) 12/16/2006   IBS 12/08/1997    Past Surgical History:  Procedure Laterality Date   BACK SURGERY  1997   bulging disks   BiV-AICD implant     [employer]   CARDIAC CATHETERIZATION  06/22/04   Severe, nonischemic cardiomyopathy,EF 25-30%   CARDIOVERSION  02/22/09   AFlutter-([facility])   CATARACT EXTRACTION W/PHACO Right 12/21/2019   Procedure: CATARACT EXTRACTION PHACO AND INTRAOCULAR LENS PLACEMENT (IOC) RIGHT 2.23  00:21.9;  Surgeon: Eulogio Bear, MD;  Location: [place];  Service: Ophthalmology;  Laterality: Right;   CATARACT EXTRACTION W/PHACO Left 02/01/2020   Procedure: CATARACT EXTRACTION PHACO AND INTRAOCULAR LENS PLACEMENT (IOC) LEFT 3.67 00:39.5;  Surgeon: Eulogio Bear, MD;  Location: [place];  Service: Ophthalmology;  Laterality: Left;   CHOLECYSTECTOMY     COLONOSCOPY  10/99   Divertic, splenic, hepatic fleure only   COLONOSCOPY  10/21/08   aborted-divertics, int hemms (Dr. Vira Agar)   CORONARY ANGIOPLASTY     EP IMPLANTABLE DEVICE N/A 04/25/2015   Procedure: BIV ICD Generator Changeout;  Surgeon: Evans Lance, MD;  Location: [place] [facility];  Service: Cardiovascular;  Laterality: N/A;   ESOPHAGEAL DILATION  02/18/98   ESOPHAGOGASTRODUODENOSCOPY  01/1998   stricture, sliding HH, GERD   ESOPHAGOGASTRODUODENOSCOPY  04/08/01   stricture, gastritis, HH,  GERD-no dilation(Dr. Henrene Pastor)   ESOPHAGOGASTRODUODENOSCOPY  12/22/04   stricture; gastritis; duodenitis, GERD   ESOPHAGOGASTRODUODENOSCOPY  10/21/08   Reflux esophagitis; Erythem. Duod. (Dr. Vira Agar)   ESOPHAGOGASTRODUODENOSCOPY  08/2013   erythema gastric fundus - minimal gastritis, HH ([place])   ESOPHAGOGASTRODUODENOSCOPY  08/2016   dysphagia - mod schatzki rin, dilated Tiffany Kocher)   ESOPHAGOGASTRODUODENOSCOPY (EGD) WITH PROPOFOL N/A 08/24/2016   mod schatzki ring dilated, duodenal deformity Manya Silvas, MD)   goiter removal     HERNIA REPAIR     HERNIA REPAIR  01/30/02   Dr. Smith Mince / REPLACE / REMOVE PACEMAKER  2007 and 2017   JOINT REPLACEMENT Right    shoulder   NOSE SURGERY  1971   sinus surgery   PENILE PROSTHESIS IMPLANT  2007   [place]   PFT  10/2013   FVC 61%, FEV1 63%, ratio 0.76   Pulmonary eval  04/2002   ([facility]) Chronic  congestive symptoms   REVERSE SHOULDER ARTHROPLASTY Right 01/17/2016   Procedure: REVERSE SHOULDER ARTHROPLASTY;  Surgeon: Corky Mull, MD;  Location: [facility];  Service: Orthopedics;  Laterality: Right;   REVERSE TOTAL SHOULDER ARTHROPLASTY Right 01/2016   [place]   US ECHOCARDIOGRAPHY  07/13/04   EF 25-30%, Mod LVH; LA severe dilation; Mild AR; IRTR   US ECHOCARDIOGRAPHY  11/27/06   hypokinesis posterior wall, EF 35%; mild AR    Prior to Admission medications   Medication Sig Start Date End Date Taking? Authorizing Provider  allopurinol (ZYLOPRIM) 100 MG tablet Take 100 mg by mouth daily. Per patient taking 2 tablets (200 mg)   Yes [provider]  ALPRAZolam (XANAX) 0.25 MG tablet TAKE 1/2 TO 1 TABLET (0.125-0.25 MG TOTAL) BY MOUTH AT BEDTIME AS NEEDED FOR ANXIETY OR SLEEP. Patient taking differently: 0.125 mg 2 (two) times daily as needed for anxiety or sleep. 11/25/20  Yes Ria Bush, MD  Azelastine HCl 137 MCG/SPRAY SOLN Place 1 spray into both nostrils 2 (two) times daily. 11/27/20  Yes [provider]  carvedilol  (COREG) 3.125 MG tablet Take 1 tablet (3.125 mg total) by mouth 2 (two) times daily with a meal. 12/10/20  Yes Cherene Altes, MD  fluticasone (FLONASE) 50 MCG/ACT nasal spray Place 1 spray into both nostrils daily.   Yes [provider]  predniSONE (DELTASONE) 5 MG tablet Take 2 tablets (10 mg total) by mouth daily with breakfast. 12/10/20  Yes Cherene Altes, MD  tamsulosin (FLOMAX) 0.4 MG CAPS capsule Take 2 capsules (0.8 mg total) by mouth daily after supper. 07/06/20  Yes Billey Co, MD  torsemide 40 MG TABS torsemide 40 twice daily for weight 132 - 135lbs torsemide 60 twice daily for weight 136 - 139lbs torsemide 60 twice daily along with metolazone '5mg'$  daily for weight 140lbs or > 12/10/20  Yes Cherene Altes, MD  vitamin B-12 (CYANOCOBALAMIN) 500 MCG tablet Take 2 tablets (1,000 mcg total) by mouth daily. 11/16/19  Yes Ria Bush, MD  warfarin (COUMADIN) 5 MG tablet TAKE 1/2 TO 1 TABLET BY MOUTH DAILY AS DIRECTED BY COUMADIN CLINIC Patient taking differently: Take 5 mg by mouth 2 (two) times a week. Mondays and Fridays 12/30/19  Yes Evans Lance, MD  acetaminophen (TYLENOL) 500 MG tablet Take 250 mg by mouth daily as needed. Pain     [provider]  alum & mag hydroxide-simeth (MAALOX/MYLANTA) 200-200-20 MG/5ML suspension Take 15 mLs by mouth daily as needed for indigestion or heartburn.    [provider]  metolazone (ZAROXOLYN) 2.5 MG tablet metolazone 2.'5mg'$  daily for weight < 140lbs metolazone 5 mg for weight 140lbs or > 12/10/20   Cherene Altes, MD  montelukast (SINGULAIR) 10 MG tablet Take 10 mg by mouth at bedtime.    [provider]  polyethylene glycol (MIRALAX / GLYCOLAX) packet Take 17 g by mouth daily as needed (constipation).  Patient not taking: No sig reported    [provider]  potassium chloride SA (KLOR-CON) 20 MEQ tablet Take 2 tablets (40 mEq total) by mouth 2 (two) times daily. 12/10/20   Cherene Altes, MD  warfarin (COUMADIN) 5 MG tablet Take 2.5 mg by mouth as directed. 5 days weekly. All days except Monday and Friday Patient not taking: No sig reported    [provider]    Allergies Carafate [sucralfate], Clarithromycin, Famotidine, Levaquin [levofloxacin], Omeprazole, Oxytetracycline, Penicillins, Proton pump inhibitors, Ranitidine, and Doxycycline  Family History  Problem Relation Age of Onset   Coronary artery disease Father    Hypertension Father    Heart failure Mother    Dysphagia Sister    Breast cancer Other    Ovarian cancer Other    Uterine cancer Other    Other Sister        stomach problems   Other Sister        stomach problems   Other Sister        stomach problems   Alcohol abuse Maternal Uncle     Social History Social History   Tobacco Use   Smoking status: Never   Smokeless tobacco: Never  Vaping Use   Vaping Use: Never used  Substance Use Topics   Alcohol use: No   Drug use: No    Review of Systems  Constitutional: No fever/chills Eyes: No visual changes. ENT: No sore throat. Cardiovascular: Denies chest pain. Respiratory: Denies shortness of breath. Gastrointestinal: No abdominal pain.  nausea, no vomiting.  No diarrhea.  No constipation. Genitourinary: Negative for dysuria. Musculoskeletal: Negative for back pain. Skin: Negative for rash. Neurological: Negative for headaches, focal weakness   ____________________________________________   PHYSICAL EXAM:  VITAL SIGNS: ED Triage Vitals  Enc Vitals Group     BP 12/13/20 0728 131/78     Pulse Rate 12/13/20 0728 69     Resp 12/13/20 0728 20     Temp 12/13/20 0728 99 F (37.2 C)     Temp Source 12/13/20 0728 Oral     SpO2 12/13/20 0728 93 %     Weight 12/13/20 0729 124 lb 6.4 oz (56.4 kg)     Height 12/13/20 0729 '5\' 4"'$  (1.626 m)     Head Circumference --      Peak Flow --      Pain Score 12/13/20 0728 0     Pain Loc --      Pain Edu? --      Excl. in Short? --      Constitutional: Alert and oriented. Well appearing and in no acute distress. Eyes: Conjunctivae are normal. PER EOMI. Head: Atraumatic. Nose: No congestion/rhinnorhea. Mouth/Throat: Mucous membranes are moist.  Oropharynx non-erythematous. Neck: No stridor.  Cardiovascular: Normal rate, regular rhythm. Grossly normal heart sounds.  Good peripheral circulation. Respiratory: Normal respiratory effort.  No retractions. Lungs CTAB. Gastrointestinal: Soft and nontender. No distention. No abdominal bruits.  Musculoskeletal: No lower extremity tenderness trace edema.  Neurologic:  Normal speech and language. No gross focal neurologic deficits are appreciated.  Skin:  Skin is warm, dry and intact. No rash noted.   ____________________________________________   LABS (all labs ordered are listed, but only abnormal results are displayed)  Labs Reviewed  COMPREHENSIVE METABOLIC PANEL - Abnormal; Notable for the following components:      Result Value   Sodium 129 (*)    Potassium 5.5 (*)    Chloride 88 (*)    Glucose, Bld 114 (*)    BUN 51 (*)    Creatinine, Ser 2.22 (*)    Total Protein 6.4 (*)    AST 432 (*)    ALT 361 (*)    Total Bilirubin 2.7 (*)    GFR, Estimated 28 (*)    Anion gap 16 (*)    All other components within normal limits  LACTIC ACID, PLASMA - Abnormal; Notable for the following components:   Lactic Acid, Venous 5.4 (*)    All other components within normal limits  LACTIC ACID, PLASMA -  Abnormal; Notable for the following components:   Lactic Acid, Venous 4.2 (*)    All other components within normal limits  CBC WITH DIFFERENTIAL/PLATELET - Abnormal; Notable for the following components:   RDW 18.3 (*)    Platelets 142 (*)    nRBC 0.4 (*)    Neutro Abs 8.2 (*)    Monocytes Absolute 1.2 (*)    Abs Immature Granulocytes 0.11 (*)    All other components within normal limits  BRAIN NATRIURETIC PEPTIDE - Abnormal; Notable for the following components:   B  Natriuretic Peptide 2,212.4 (*)    All other components within normal limits  PROTIME-INR - Abnormal; Notable for the following components:   Prothrombin Time 27.0 (*)    INR 2.5 (*)    All other components within normal limits  ACETAMINOPHEN LEVEL - Abnormal; Notable for the following components:   Acetaminophen (Tylenol), Serum <10 (*)    All other components within normal limits  GASTROINTESTINAL PANEL BY PCR, STOOL (REPLACES STOOL CULTURE)  C DIFFICILE QUICK SCREEN W PCR REFLEX    APTT  URINALYSIS, COMPLETE (UACMP) WITH MICROSCOPIC  TROPONIN I (HIGH SENSITIVITY)  TROPONIN I (HIGH SENSITIVITY)   ____________________________________________  EKG  EKG read interpreted by me shows a fully paced rhythm with PVCs at a rate of 77 EKG looks very similar to 1 from last month on the 28th.  ____________________________________________  RADIOLOGY Gertha Calkin, personally viewed and evaluated these images (plain radiographs) as part of my medical decision making, as well as reviewing the written report by the radiologist.  ED MD interpretation: Chest x-ray read by radiology reviewed by me shows some cardiomegaly but no obvious new findings.  Ultrasound reviewed by me read by radiology shows some small amount of ascites around the liver.  No obvious other findings.  Radiology feels that the liver looks like it may be cirrhotic.  Official radiology report(s): DG Chest Portable 1 View  Result Date: 12/13/2020 CLINICAL DATA:  87 year old male with nausea and weakness. EXAM: PORTABLE CHEST 1 VIEW COMPARISON:  Chest radiographs 12/04/2020 and earlier. FINDINGS: Portable AP upright view at 0737 hours. Stable cardiomegaly and mediastinal contours. Visualized tracheal air column is within normal limits. Stable left chest AICD. A small right pleural effusion appears increased since March, stable since last month. No pneumothorax or pulmonary edema. No consolidation or areas of worsening  ventilation. Previous right shoulder arthroplasty. No acute osseous abnormality identified. IMPRESSION: 1. Chronic cardiomegaly. Small pleural effusion(s) stable from last month. 2. No new cardiopulmonary abnormality. Electronically Signed   By: Genevie Ann M.D.   On: 12/13/2020 07:59   US Abdomen Limited RUQ (LIVER/GB)  Result Date: 12/13/2020 CLINICAL DATA:  Elevated LFTs. EXAM: ULTRASOUND ABDOMEN LIMITED RIGHT UPPER QUADRANT COMPARISON:  None. FINDINGS: Gallbladder: Surgically absent. Common bile duct: Diameter: 3 mm Liver: No focal lesion identified. Within normal limits in parenchymal echogenicity. Mildly nodular contour. Portal vein is patent on color Doppler imaging with normal direction of blood flow towards the liver. Other: Small amount of right upper quadrant ascites IMPRESSION: Small amount of right upper quadrant ascites. Mildly nodular liver contour, findings are suggestive of cirrhosis. Electronically Signed   By: Yetta Glassman M.D.   On: 12/13/2020 09:38    ____________________________________________   PROCEDURES  Procedure(s) performed (including Critical Care):  Procedures   ____________________________________________   INITIAL IMPRESSION / ASSESSMENT AND PLAN / ED COURSE  Patient clinically appears to be dehydrated.  His sodium is low his potassium is elevated  his GFR is gone up.  His lactic acid additionally is elevated at 5.4.  His BNP however is 2200.  His troponin was 9 went up slightly to 11.  Patient is not having any pain in the abdomen chest neck back or elsewhere he said initially but on exam he did have some right upper quadrant pain.  The ultrasound did not show any pathology there.  He did have some elevated liver function test however.  Because of the elevated liver function test the hyponatremia and his overall appearance together with the elevated lactic acid, temperature of 99 Fahrenheit and BNP being up I will get him in the hospital.               ____________________________________________   FINAL CLINICAL IMPRESSION(S) / ED DIAGNOSES  Final diagnoses:  RUQ pain  Elevated LFTs     ED Discharge Orders     None        Note:  This document was prepared using Dragon voice recognition software and may include unintentional dictation errors.    Nena Polio, MD 12/13/20 [phone]

## 2020-12-14 ENCOUNTER — Telehealth: Payer: Self-pay | Admitting: Cardiovascular Disease

## 2020-12-14 DIAGNOSIS — K7469 Other cirrhosis of liver: Secondary | ICD-10-CM

## 2020-12-14 DIAGNOSIS — N179 Acute kidney failure, unspecified: Secondary | ICD-10-CM | POA: Diagnosis not present

## 2020-12-14 DIAGNOSIS — I482 Chronic atrial fibrillation, unspecified: Secondary | ICD-10-CM | POA: Diagnosis not present

## 2020-12-14 DIAGNOSIS — R7401 Elevation of levels of liver transaminase levels: Secondary | ICD-10-CM | POA: Diagnosis not present

## 2020-12-14 DIAGNOSIS — I5022 Chronic systolic (congestive) heart failure: Secondary | ICD-10-CM | POA: Diagnosis not present

## 2020-12-14 DIAGNOSIS — E860 Dehydration: Secondary | ICD-10-CM | POA: Diagnosis not present

## 2020-12-14 DIAGNOSIS — E871 Hypo-osmolality and hyponatremia: Secondary | ICD-10-CM | POA: Diagnosis not present

## 2020-12-14 LAB — URINALYSIS, COMPLETE (UACMP) WITH MICROSCOPIC
Bacteria, UA: NONE SEEN
Bilirubin Urine: NEGATIVE
Glucose, UA: NEGATIVE mg/dL
Ketones, ur: NEGATIVE mg/dL
Leukocytes,Ua: NEGATIVE
Nitrite: NEGATIVE
Protein, ur: NEGATIVE mg/dL
RBC / HPF: NONE SEEN RBC/hpf (ref 0–5)
Specific Gravity, Urine: 1.015 (ref 1.005–1.030)
pH: 6 (ref 5.0–8.0)

## 2020-12-14 LAB — COMPREHENSIVE METABOLIC PANEL
ALT: 620 U/L — ABNORMAL HIGH (ref 0–44)
AST: 768 U/L — ABNORMAL HIGH (ref 15–41)
Albumin: 2.9 g/dL — ABNORMAL LOW (ref 3.5–5.0)
Alkaline Phosphatase: 79 U/L (ref 38–126)
Anion gap: 8 (ref 5–15)
BUN: 48 mg/dL — ABNORMAL HIGH (ref 8–23)
CO2: 26 mmol/L (ref 22–32)
Calcium: 8 mg/dL — ABNORMAL LOW (ref 8.9–10.3)
Chloride: 98 mmol/L (ref 98–111)
Creatinine, Ser: 2 mg/dL — ABNORMAL HIGH (ref 0.61–1.24)
GFR, Estimated: 32 mL/min — ABNORMAL LOW (ref >60)
Glucose, Bld: 80 mg/dL (ref 70–99)
Potassium: 3.7 mmol/L (ref 3.5–5.1)
Sodium: 132 mmol/L — ABNORMAL LOW (ref 135–145)
Total Bilirubin: 1.7 mg/dL — ABNORMAL HIGH (ref 0.3–1.2)
Total Protein: 4.7 g/dL — ABNORMAL LOW (ref 6.5–8.1)

## 2020-12-14 LAB — CBC
HCT: 33.8 % — ABNORMAL LOW (ref 39.0–52.0)
Hemoglobin: 11.9 g/dL — ABNORMAL LOW (ref 13.0–17.0)
MCH: 30.6 pg (ref 26.0–34.0)
MCHC: 35.2 g/dL (ref 30.0–36.0)
MCV: 86.9 fL (ref 80.0–100.0)
Platelets: 94 10*3/uL — ABNORMAL LOW (ref 150–400)
RBC: 3.89 MIL/uL — ABNORMAL LOW (ref 4.22–5.81)
RDW: 18.1 % — ABNORMAL HIGH (ref 11.5–15.5)
WBC: 6.1 10*3/uL (ref 4.0–10.5)
nRBC: 0 % (ref 0.0–0.2)

## 2020-12-14 LAB — PROTIME-INR
INR: 2.6 — ABNORMAL HIGH (ref 0.8–1.2)
Prothrombin Time: 27.4 seconds — ABNORMAL HIGH (ref 11.4–15.2)

## 2020-12-14 MED ORDER — ACETAMINOPHEN 325 MG PO TABS
650.0000 mg | ORAL_TABLET | Freq: Once | ORAL | Status: AC
  Administered 2020-12-14: 650 mg via ORAL
  Filled 2020-12-14: qty 2

## 2020-12-14 MED ORDER — WARFARIN SODIUM 2 MG PO TABS
2.0000 mg | ORAL_TABLET | Freq: Every day | ORAL | Status: AC
  Administered 2020-12-14: 2 mg via ORAL
  Filled 2020-12-14: qty 1

## 2020-12-14 MED ORDER — TORSEMIDE 20 MG PO TABS
20.0000 mg | ORAL_TABLET | Freq: Every day | ORAL | Status: DC
  Administered 2020-12-15 – 2020-12-16 (×2): 20 mg via ORAL
  Filled 2020-12-14 (×2): qty 1

## 2020-12-14 MED ORDER — TORSEMIDE 20 MG PO TABS
40.0000 mg | ORAL_TABLET | Freq: Every day | ORAL | Status: DC
  Administered 2020-12-14: 20 mg via ORAL

## 2020-12-14 NOTE — TOC Initial Note (Signed)
Transition of Care [facility]) - Initial/Assessment Note    Patient Details  Name: [name] MRN: [id number] Date of Birth: [birth year]  Transition of Care [facility]) CM/SW Contact:    Shelbie Hutching, RN Phone Number: 12/14/2020, 1:13 PM  Clinical Narrative:                 RNCM met with patient at the bedside, wife Vickii Chafe and a daughter are also at the bedside.  Patient is from home with his wife and is independent at baseline, he does walk with a walker when he feels he needs to.  He can drive but wife does most of the driving.   OT worked with patient this morning and recommended HH, patient and family decline home health at this time.   Patient is current with his PCP and has no trouble getting medications.    Expected Discharge Plan: Home/Self Care Barriers to Discharge: Continued Medical Work up   Patient Goals and CMS Choice Patient states their goals for this hospitalization and ongoing recovery are:: Patient wants to get out of the hospital      Expected Discharge Plan and Services Expected Discharge Plan: Home/Self Care   Discharge Planning Services: CM Consult   Living arrangements for the past 2 months: Single Family Home                 DME Arranged: N/A DME Agency: NA       HH Arranged: Refused [place] Agency: NA        Prior Living Arrangements/Services Living arrangements for the past 2 months: Single Family Home Lives with:: Spouse Patient language and need for interpreter reviewed:: Yes Do you feel safe going back to the place where you live?: Yes      Need for Family Participation in Patient Care: Yes (Comment) Care giver support system in place?: Yes (comment) Current home services: DME (walker) Criminal Activity/Legal Involvement Pertinent to Current Situation/Hospitalization: No - Comment as needed  Activities of Daily Living Home Assistive Devices/Equipment: None ADL Screening (condition at time of admission) Patient's cognitive ability  adequate to safely complete daily activities?: Yes Is the patient deaf or have difficulty hearing?: No Does the patient have difficulty seeing, even when wearing glasses/contacts?: No Does the patient have difficulty concentrating, remembering, or making decisions?: No Patient able to express need for assistance with ADLs?: Yes Does the patient have difficulty dressing or bathing?: Yes Independently performs ADLs?: No Communication: Needs assistance Is this a change from baseline?: Change from baseline, expected to last <3 days Dressing (OT): Needs assistance Is this a change from baseline?: Change from baseline, expected to last <3days Grooming: Needs assistance Is this a change from baseline?: Change from baseline, expected to last <3 days Feeding: Independent Bathing: Needs assistance Is this a change from baseline?: Change from baseline, expected to last <3 days Toileting: Needs assistance Is this a change from baseline?: Change from baseline, expected to last <3 days In/Out Bed: Needs assistance Is this a change from baseline?: Change from baseline, expected to last <3 days Walks in Home: Independent Is this a change from baseline?: Pre-admission baseline Does the patient have difficulty walking or climbing stairs?: No Weakness of Legs: Both Weakness of Arms/Hands: Both  Permission Sought/Granted Permission sought to share information with : Case Manager, Family Supports Permission granted to share information with : Yes, Verbal Permission Granted  Share Information with NAME: Vickii Chafe     Permission granted to share info w Relationship: wife  Permission granted to share info w Contact Information: [phone]  Emotional Assessment Appearance:: Appears stated age Attitude/Demeanor/Rapport: Lethargic Affect (typically observed): Accepting Orientation: : Oriented to Self, Oriented to Place, Oriented to  Time, Oriented to Situation Alcohol / Substance Use: Not Applicable Psych  Involvement: No (comment)  Admission diagnosis:  Hyponatremia [E87.1] Right upper quadrant pain [R10.11] RUQ pain [R10.11] Elevated liver function tests [R79.89] Elevated LFTs [R79.89] Generalized weakness [R53.1] Patient Active Problem List   Diagnosis Date Noted   Generalized weakness 12/13/2020   Lactic acidosis    Transaminitis    Dehydration with hyponatremia    Acute CHF (congestive heart failure) ([place]) 12/05/2020   HTN (hypertension) 12/05/2020   Asthma 12/05/2020   Gout 12/05/2020   Acute on chronic systolic CHF (congestive heart failure) ([place]) 12/05/2020   CKD (chronic kidney disease), stage IIIa 12/05/2020   Hyponatremia 12/05/2020   Hyperkalemia 12/05/2020   Acute on chronic HFrEF (heart failure with reduced ejection fraction) (HCC)    Chronic atrial fibrillation ([place]) 02/16/2020   Prediabetes 11/22/2019   Pedal edema 11/16/2019   Decreased visual acuity 11/16/2019   Low back pain radiating to right lower extremity 07/09/2019   Health maintenance examination 11/11/2018   Chronic lung disease    Nausea 11/04/2017   Abnormal x-ray 10/28/2017   Pulmonary HTN ([place]) 03/25/2017   Aortic valve regurgitation 03/25/2017   Other fatigue 12/04/2016   Nondisplaced fracture of greater trochanter of left femur ([place]) 10/30/2016   Status post reverse total arthroplasty of right shoulder 01/17/2016   Complete tear of right rotator cuff 12/10/2015   Acute sinusitis 03/18/2015   Complete tear of left rotator cuff 05/07/2014   Warfarin anticoagulation 05/07/2014   Advanced care planning/counseling discussion 03/16/2014   Chronic constipation 09/09/2013   Hyperglycemia 09/09/2013   Medicare annual wellness visit, subsequent 04/22/2012   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   Left sided sciatica 06/11/2011   Thrombocytopenia ([place]) 02/25/2011   Vitamin B12 deficiency 02/22/2011   Congestion of respiratory tract 09/25/2010   Long term current use of anticoagulant 06/27/2010    ESOPHAGEAL STRICTURE 02/16/2010   CHEST PAIN 01/03/2010   Atrial flutter ([place]) 12/20/2008   Anxiety 12/08/2008   NICM (nonischemic cardiomyopathy) ([place]) 09/13/2008   LBBB 09/13/2008   Automatic implantable cardioverter-defibrillator in situ 09/13/2008   PORPHYRIA 95/28/4132   Chronic systolic heart failure ([place]) 12/16/2006   Allergic rhinitis 12/16/2006   GERD 12/16/2006   BPH (benign prostatic hyperplasia) 12/16/2006   IBS 12/08/1997   PCP:  Ria Bush, MD Pharmacy:   [id number] - [place], [place] - [address] [place] [address] Phone: [phone] Fax: [place], [place] - [address] [place] [address] Phone: [phone] Fax: [phone]     Social Determinants of Health (SDOH) Interventions    Readmission Risk Interventions Readmission Risk Prevention Plan 06/21/2020  Transportation Screening Complete  PCP or Specialist Appt within 3-5 Days Complete  HRI or Home Care Consult Complete  Social Work Consult for [place] Planning/Counseling Complete  Palliative Care Screening Not Applicable  Medication Review Press photographer) Complete  Some recent data might be hidden

## 2020-12-14 NOTE — Care Management Obs Status (Signed)
[place] NOTIFICATION   Patient Details  Name: [name] MRN: [id number] Date of Birth: 1931-05-21   Medicare Observation Status Notification Given:  Yes    Shelbie Hutching, RN 12/14/2020, 10:39 AM

## 2020-12-14 NOTE — Telephone Encounter (Signed)
Son calling with concern father is back in hospital for dehydration wants to discuss when he can go back to taking his torsemide

## 2020-12-14 NOTE — Telephone Encounter (Signed)
Was able to reach out to pts son Octavia Bruckner (DPR approved), advised Dr. Rockey Situ cannot give out recommendations at this time for pt's torsemide, pt is currently admitted into the hospital, advised that pt's treatment team will be following his care and his medications. Tried to explain that Dr. Rockey Situ cannot leave clinic to go and evaluate pt and adjust his medications while being admitted as Dr. Rockey Situ is not on call for the hospitlal, but another one of our providers are.  Suggest to have order for cardiology consult, Octavia Bruckner stated "they should have already put that in".  Advised then one of our cardiologist should be able to come assess pt. At this time, dehydration noted and torsemide may not be needed at this time, but has f/u app with Darylene Price on 9/14, she may be able to address his torsemide if not given orders to hold or restart after hospital discharge. [name] verbalized understanding, but upset Dr. Rockey Situ cannot go see pt or give order over the phone. RN gave apologies over the phone.  [name], hangup phone in a hurry as RN was Furniture conservator/restorer.   Noted this pt has not been seen by our cardiologist team, he is followed by [name], MD, with [facility].   When addressing call to Dr. Rockey Situ in person: Dr. Saunders Revel verbalized seen pt last night, Dr. Rockey Situ made aware of situation as he was standing by nurse, reports needs to follow medication guidance from care team of the hospital and the cardiology on-call plan of care. Dr. Saunders Revel and Dr. Rockey Situ consulted regarding pt, he is not a patient under [facility], he is followed by [facility] Dr. Rockey Situ reports he spoke with family and pt back on 9/01 as he was the cardiologist on call that hospital admission, but is not his patient, any concerns he needs to reach out to Dr. [name].

## 2020-12-14 NOTE — Progress Notes (Signed)
Progress Note  Patient Name: [name] Date of Encounter: 12/14/2020  [facility] Cardiologist: [name], MD   Subjective   States doing okay, would like to go home.  Daughter at bedside, worried that patient has not had any diuretics the past 2 days.  Inpatient Medications    Scheduled Meds:  allopurinol  100 mg Oral Daily   azelastine  1 spray Each Nare BID   fluticasone  1 spray Each Nare Daily   montelukast  10 mg Oral QHS   predniSONE  10 mg Oral Q breakfast   sodium chloride flush  3 mL Intravenous Q12H   tamsulosin  0.8 mg Oral QPC supper   vitamin B-12  1,000 mcg Oral Daily   warfarin  2 mg Oral q1600   Warfarin - Pharmacist Dosing Inpatient   Does not apply q1600   Continuous Infusions:  sodium chloride     PRN Meds: sodium chloride, ALPRAZolam, ondansetron **OR** ondansetron (ZOFRAN) IV, sodium chloride flush   Vital Signs    Vitals:   12/14/20 0610 12/14/20 0700 12/14/20 0809 12/14/20 1243  BP: (!) 155/138  (!) 95/48 104/65  Pulse: 71  85 (!) 103  Resp: '19  20 18  '$ Temp: 98.7 F (37.1 C)  98.9 F (37.2 C) 98.6 F (37 C)  TempSrc:    Oral  SpO2: (!) 83% 93% 94% 91%  Weight:      Height:        Intake/Output Summary (Last 24 hours) at 12/14/2020 1342 Last data filed at 12/14/2020 0809 Gross per 24 hour  Intake --  Output 725 ml  Net -725 ml   Last 3 Weights 12/13/2020 12/10/2020 12/09/2020  Weight (lbs) 124 lb 6.4 oz 137 lb 2 oz 141 lb 8.6 oz  Weight (kg) 56.427 kg 62.2 kg 64.2 kg      Telemetry    Currently off telemetry- Personally Reviewed  ECG     - Personally Reviewed  Physical Exam   GEN: No acute distress.   Neck: No JVD Cardiac: RRR, no murmurs Respiratory: Clear to auscultation bilaterally. GI: Soft, nontender, non-distended  MS: 1-2+ edema; No deformity. Neuro:  Nonfocal  Psych: Normal affect   Labs    High Sensitivity Troponin:   Recent Labs  Lab 12/04/20 1956 12/04/20 2242 12/13/20 0802  12/13/20 0953  TROPONINIHS '4 4 9 11      '$ Chemistry Recent Labs  Lab 12/10/20 0439 12/13/20 0802 12/14/20 0442  NA 136 129* 132*  K 3.9 5.5* 3.7  CL 97* 88* 98  CO2 32 25 26  GLUCOSE 94 114* 80  BUN 40* 51* 48*  CREATININE 1.71* 2.22* 2.00*  CALCIUM 8.8* 9.4 8.0*  PROT  --  6.4* 4.7*  ALBUMIN  --  3.9 2.9*  AST  --  432* 768*  ALT  --  361* 620*  ALKPHOS  --  107 79  BILITOT  --  2.7* 1.7*  GFRNONAA 38* 28* 32*  ANIONGAP 7 16* 8     Hematology Recent Labs  Lab 12/10/20 0439 12/13/20 0802 12/14/20 0442  WBC 6.9 10.5 6.1  RBC 4.02*  3.88* 4.79 3.89*  HGB 12.1* 14.1 11.9*  HCT 35.3* 42.5 33.8*  MCV 87.8 88.7 86.9  MCH 30.1 29.4 30.6  MCHC 34.3 33.2 35.2  RDW 18.2* 18.3* 18.1*  PLT 112* 142* 94*    BNP Recent Labs  Lab 12/13/20 0802  BNP 2,212.4*     DDimer No results for input(s):  DDIMER in the last 168 hours.   Radiology    DG Chest Portable 1 View  Result Date: 12/13/2020 CLINICAL DATA:  87 year old male with nausea and weakness. EXAM: PORTABLE CHEST 1 VIEW COMPARISON:  Chest radiographs 12/04/2020 and earlier. FINDINGS: Portable AP upright view at 0737 hours. Stable cardiomegaly and mediastinal contours. Visualized tracheal air column is within normal limits. Stable left chest AICD. A small right pleural effusion appears increased since March, stable since last month. No pneumothorax or pulmonary edema. No consolidation or areas of worsening ventilation. Previous right shoulder arthroplasty. No acute osseous abnormality identified. IMPRESSION: 1. Chronic cardiomegaly. Small pleural effusion(s) stable from last month. 2. No new cardiopulmonary abnormality. Electronically Signed   By: Genevie Ann M.D.   On: 12/13/2020 07:59   US Abdomen Limited RUQ (LIVER/GB)  Result Date: 12/13/2020 CLINICAL DATA:  Elevated LFTs. EXAM: ULTRASOUND ABDOMEN LIMITED RIGHT UPPER QUADRANT COMPARISON:  None. FINDINGS: Gallbladder: Surgically absent. Common bile duct: Diameter: 3 mm  Liver: No focal lesion identified. Within normal limits in parenchymal echogenicity. Mildly nodular contour. Portal vein is patent on color Doppler imaging with normal direction of blood flow towards the liver. Other: Small amount of right upper quadrant ascites IMPRESSION: Small amount of right upper quadrant ascites. Mildly nodular liver contour, findings are suggestive of cirrhosis. Electronically Signed   By: Yetta Glassman M.D.   On: 12/13/2020 09:38    Cardiac Studies   TTE 12/06/2020 1. Left ventricular ejection fraction, by estimation, is 20 to 25%. The  left ventricle has severely decreased function. The left ventricle  demonstrates global hypokinesis. Left ventricular diastolic parameters are  indeterminate. The average left  ventricular global longitudinal strain is -9.0 %. The global longitudinal  strain is abnormal.   2. Right ventricular systolic function is moderately reduced. The right  ventricular size is normal. There is mildly elevated pulmonary artery  systolic pressure.   3. Left atrial size was severely dilated.   4. Right atrial size was moderately dilated.   5. The mitral valve is abnormal. Mild mitral valve regurgitation.   6. The aortic valve was not well visualized. Aortic valve regurgitation  is mild. No aortic stenosis is present.   7. The inferior vena cava is normal in size with <50% respiratory  variability, suggesting right atrial pressure of 8 mmHg.  Patient Profile     87 y.o. male with history of nonischemic cardiomyopathy, last EF 20 to 25%, s/p AICD, atrial flutter, GERD, pulmonary hypertension who presents with diarrhea, dehydration.  Being seen for cardiomyopathy.  Assessment & Plan    NICM EF 20-25% -Blood pressure is low normal -Recently admitted for volume overload -Start torsemide 40 mg daily.  Titrate as BP permits for adequate diuresis.  Recently discharged on 60 mg torsemide and metolazone. -Low blood pressures preventing use of GDMT at  this time.  2.  Permanent atrial fibrillation -Warfarin  3.  S/p AICD -Appears to be working normally  4.  Dehydration, electrolyte abnormalities -Diarrhea secondary to laxative use -S/p IV fluids   Overall prognosis remains guarded.  Slow titration of diuretics as above.  Total encounter time 35 minutes  Greater than 50% was spent in counseling and coordination of care with the patient       Signed, Kate Sable, MD  12/14/2020, 1:42 PM

## 2020-12-14 NOTE — Evaluation (Addendum)
Occupational Therapy Evaluation Patient Details Name: [name] MRN: [id number] DOB: 12/06/1931 Today's Date: 12/14/2020    History of Present Illness 87 y.o. male with medical history significant for chronic systolic CHF with EF of 0000000, hypertension, asthma, GERD, gout, anxiety, AICD placement, left bundle blockage, porphyruria, IBS, GI bleeding, diverticulitis, BPH, atrial fibrillation on Coumadin, anemia, aortic valve regurgitation, esophageal stricture, pulmonary hypertension, thrombocytopenia, CKD-3A, sinus infection, who was recently discharged from the hospital after hospitalization for acute exacerbation of his known CHF.  He presents to the emergency room for evaluation of nausea and weakness.   Clinical Impression   Patient presenting with decreased Ind in self care, balance, functional mobility/transfers, endurance, and safety awareness.  Patient reports living at home with wife and utilizing RW for functional mobility recently for safety. Pt endorses being Ind with self care tasks and takes sponge baths. Pt's wife and daughter present to confirm baseline. Pt with decreased alertness at beginning of session possibility from medication. Patient performs supine >sit with min guard to EOB. Pt needing min A to thread socks over feet and use of figure four position to pull up with min guard for balance. Pt standing with min A and taking several side steps, turns, and short distance ambulation forwards and backwards with min A overall without use of AD. Pt returns to bed with supervision and min cuing for technique. Patient will benefit from acute OT to increase overall independence in the areas of ADLs, functional mobility, safety awareness in order to safely discharge home with family.  BP in supine:106/62 BP after standing:97/53    Follow Up Recommendations  Home health OT;Supervision - Intermittent    Equipment Recommendations  None recommended by OT       Precautions /  Restrictions Precautions Precautions: Fall      Mobility Bed Mobility Overal bed mobility: Needs Assistance Bed Mobility: Supine to Sit;Sit to Supine     Supine to sit: Min guard;HOB elevated Sit to supine: Min guard;HOB elevated   General bed mobility comments: use of bed rails and min verbal cuing for technique    Transfers Overall transfer level: Needs assistance Equipment used: 1 person hand held assist Transfers: Sit to/from [facility] Sit to Stand: Min guard Stand pivot transfers: Min guard       General transfer comment: min guard for balance    Balance Overall balance assessment: Needs assistance Sitting-balance support: Feet supported Sitting balance-Leahy Scale: Good     Standing balance support: During functional activity Standing balance-Leahy Scale: Fair                             ADL either performed or assessed with clinical judgement   ADL Overall ADL's : Needs assistance/impaired                     Lower Body Dressing: Minimal assistance Lower Body Dressing Details (indicate cue type and reason): min A to thread socks onto feet and min guard for balance on EOB                     Vision Patient Visual Report: No change from baseline              Pertinent Vitals/Pain Pain Assessment: No/denies pain     Hand Dominance Right   Extremity/Trunk Assessment Upper Extremity Assessment Upper Extremity Assessment: Overall WFL for tasks assessed;Generalized weakness   Lower Extremity  Assessment Lower Extremity Assessment: Overall WFL for tasks assessed;Generalized weakness       Communication Communication Communication: No difficulties   Cognition Arousal/Alertness: Awake/alert Behavior During Therapy: WFL for tasks assessed/performed Overall Cognitive Status: Within Functional Limits for tasks assessed                                 General Comments: A & O x4               Home Living Family/patient expects to be discharged to:: Private residence Living Arrangements: Spouse/significant other Available Help at Discharge: Family;Available 24 hours/day Type of Home: House Home Access: Level entry     Home Layout: One level     Bathroom Shower/Tub: Tub/shower unit;Curtain   Bathroom Toilet: Handicapped height     Home Equipment: [place] - single point;Walker - standard;Bedside commode;Shower seat          Prior Functioning/Environment Level of Independence: Independent with assistive device(s)        Comments: Pt has been using RW for functional mobility/transfers. He reports being Ind in self care and taking sponge baths. Pt does not drive.        OT Problem List: Decreased strength;Decreased activity tolerance;Decreased safety awareness;Impaired balance (sitting and/or standing);Decreased knowledge of precautions      OT Treatment/Interventions: Self-care/ADL training;Therapeutic exercise;Therapeutic activities;Energy conservation;Patient/family education;Balance training;Manual therapy    OT Goals(Current goals can be found in the care plan section) Acute Rehab OT Goals Patient Stated Goal: to go home OT Goal Formulation: With patient/family Time For Goal Achievement: 12/28/20 Potential to Achieve Goals: Good ADL Goals Pt Will Perform Grooming: with modified independence;standing Pt Will Perform Lower Body Dressing: with modified independence;sit to/from stand Pt Will Transfer to Toilet: with modified independence;ambulating Pt Will Perform Toileting - Clothing Manipulation and hygiene: with modified independence;sit to/from stand  OT Frequency: Min 2X/week   Barriers to D/C:    none known at this time          AM-PAC OT "6 Clicks" Daily Activity     Outcome Measure Help from another person eating meals?: None Help from another person taking care of personal grooming?: None Help from another person toileting, which includes  using toliet, bedpan, or urinal?: A Little Help from another person bathing (including washing, rinsing, drying)?: A Little Help from another person to put on and taking off regular upper body clothing?: A Little Help from another person to put on and taking off regular lower body clothing?: A Little 6 Click Score: 20   End of Session    Activity Tolerance: Patient tolerated treatment well Patient left: in bed;with call bell/phone within reach;with family/visitor present  OT Visit Diagnosis: Muscle weakness (generalized) (M62.81)                Time: [id number] OT Time Calculation (min): 23 min Charges:  OT General Charges $OT Visit: 1 Visit OT Evaluation $OT Eval Low Complexity: 1 Low OT Treatments $Self Care/Home Management : 8-22 mins  Darleen Crocker, MS, OTR/L , CBIS ascom [phone]  12/14/20, 10:39 AM

## 2020-12-14 NOTE — Progress Notes (Addendum)
[place] for warfarin Indication: atrial fibrillation  Allergies  Allergen Reactions   Carafate [Sucralfate] Other (See Comments)    Burns stomach   Clarithromycin Nausea Only   Famotidine Other (See Comments)    ABD. PAIN   Levaquin [Levofloxacin] Other (See Comments)    Stomach pain, has to eat a lot of food   Omeprazole Diarrhea   Oxytetracycline Other (See Comments)    BUMPS   Penicillins Swelling    Amoxicillin ok, Pt states he had to have a shot to reverse the reaction Has patient had a PCN reaction causing immediate rash, facial/tongue/throat swelling, SOB or lightheadedness with hypotension: Yes Has patient had a PCN reaction causing severe rash involving mucus membranes or skin necrosis: No Has patient had a PCN reaction that required hospitalization No Has patient had a PCN reaction occurring within the last 10 years: Yes If all of the above answers are "NO", then may proceed with   Proton Pump Inhibitors Other (See Comments)    GI upset, diarrhea, gas, bloating   Ranitidine Diarrhea   Doxycycline Rash    Patient Measurements: Height: '5\' 4"'$  (162.6 cm) Weight: 56.4 kg (124 lb 6.4 oz) IBW/kg (Calculated) : 59.2   Vital Signs: Temp: 98.7 F (37.1 C) (09/07 0610) Temp Source: Oral (09/06 2332) BP: 155/138 (09/07 0610) Pulse Rate: 71 (09/07 0610)  Labs: Recent Labs    12/13/20 0802 12/13/20 0953 12/14/20 0442  HGB 14.1  --  11.9*  HCT 42.5  --  33.8*  PLT 142*  --  94*  APTT 36  --   --   LABPROT 27.0*  --  27.4*  INR 2.5*  --  2.6*  CREATININE 2.22*  --  2.00*  TROPONINIHS 9 11  --     Estimated Creatinine Clearance: 20.8 mL/min (A) (by C-G formula based on SCr of 2 mg/dL (H)).   Medical History: Past Medical History:  Diagnosis Date   AICD (automatic cardioverter/defibrillator) present    s/p gen change 04/2015 w/ MDT [name] Crt-D  DTBA1D1, Serial number [id number]   Allergic rhinitis    [name]   Anemia     Anxiety    Arthritis    Atrial flutter ([place])    A.  Status post cardioversion; B.  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 Lucky Cowboy)   Chronic lung disease    spirometry 2015 - no obstruction, + mild restrictive lung disease   Chronic sinusitis    Chronic systolic congestive heart failure ([place])    Dyspnea    with exertion   Extrinsic asthma    [name]   Fall with injury 05/28/2017   GERD (gastroesophageal reflux disease) 2010   h/o esophageal stricture with dilation, LA grade C reflux esophagitis by EGD 2010   Goiter    History of diverticulitis of colon 06/2020   History of GI bleed    Secondary to hemorrhoids   History of seasonal allergies    Hypertension    IBS (irritable bowel syndrome)    LBBB (left bundle branch block)    Multiple pulmonary nodules 06/2013   RUL ([facility] Adam Phenix [facility]) - ?vasculitis as of last CT at [facility]   NICM (nonischemic cardiomyopathy) [facility])    Cardiac catheterization March 2006 without coronary  disease; EF 25% 2018 Jan   Orthopnea    [facility])    Presence of permanent cardiac pacemaker    Sinus congestion 09/25/2010    Medications:  Medications Prior to Admission  Medication Sig Dispense Refill Last Dose   allopurinol (ZYLOPRIM) 100 MG tablet Take 100 mg by mouth daily. Per patient taking 2 tablets (200 mg)   12/12/2020 at 0800   ALPRAZolam (XANAX) 0.25 MG tablet TAKE 1/2 TO 1 TABLET (0.125-0.25 MG TOTAL) BY MOUTH AT BEDTIME AS NEEDED FOR ANXIETY OR SLEEP. (Patient taking differently: 0.125 mg 2 (two) times daily as needed for anxiety or sleep.) 30 tablet 1 12/12/2020 at 2000   Azelastine HCl 137 MCG/SPRAY SOLN Place 1 spray into both nostrils 2 (two) times daily.   12/12/2020 at 2000   carvedilol (COREG) 3.125 MG tablet Take 1 tablet (3.125 mg total) by mouth 2 (two) times  daily with a meal. 60 tablet 1 12/12/2020 at 2000   fluticasone (FLONASE) 50 MCG/ACT nasal spray Place 1 spray into both nostrils daily.   12/12/2020 at 1500   montelukast (SINGULAIR) 10 MG tablet Take 10 mg by mouth at bedtime.   12/12/2020 at 1800   predniSONE (DELTASONE) 5 MG tablet Take 2 tablets (10 mg total) by mouth daily with breakfast.   12/12/2020 at 0800   tamsulosin (FLOMAX) 0.4 MG CAPS capsule Take 2 capsules (0.8 mg total) by mouth daily after supper. 90 capsule 7 12/12/2020 at 1800   torsemide 40 MG TABS torsemide 40 twice daily for weight 132 - 135lbs torsemide 60 twice daily for weight 136 - 139lbs torsemide 60 twice daily along with metolazone '5mg'$  daily for weight 140lbs or > 90 tablet 1 12/12/2020 at 0800   vitamin B-12 (CYANOCOBALAMIN) 500 MCG tablet Take 2 tablets (1,000 mcg total) by mouth daily.   12/12/2020 at 0800   warfarin (COUMADIN) 5 MG tablet TAKE 1/2 TO 1 TABLET BY MOUTH DAILY AS DIRECTED BY COUMADIN CLINIC (Patient taking differently: Take 5 mg by mouth 2 (two) times a week. Mondays and Fridays) 90 tablet 2 12/12/2020 at 1800   acetaminophen (TYLENOL) 500 MG tablet Take 250 mg by mouth daily as needed. Pain    unknown   alum & mag hydroxide-simeth (MAALOX/MYLANTA) 200-200-20 MG/5ML suspension Take 15 mLs by mouth daily as needed for indigestion or heartburn.   unknown at prn   metolazone (ZAROXOLYN) 2.5 MG tablet metolazone 2.'5mg'$  daily for weight < 140lbs metolazone 5 mg for weight 140lbs or > 30 tablet 1 prn at prn   polyethylene glycol (MIRALAX / GLYCOLAX) packet Take 17 g by mouth daily as needed (constipation).  (Patient not taking: No sig reported)      potassium chloride SA (KLOR-CON) 20 MEQ tablet Take 2 tablets (40 mEq total) by mouth 2 (two) times daily. 120 tablet 1 12/11/2020 at 2000   warfarin (COUMADIN) 5 MG tablet Take 2.5 mg by mouth as directed. 5 days weekly. All days except Monday and Friday (Patient not taking: No sig reported)   Not Taking   Scheduled:    allopurinol  100 mg Oral Daily   azelastine  1 spray Each Nare BID   fluticasone  1 spray Each Nare Daily   montelukast  10 mg Oral QHS   predniSONE  10 mg Oral Q breakfast   sodium chloride flush  3 mL Intravenous Q12H   sodium zirconium cyclosilicate  10 g Oral Daily   tamsulosin  0.8 mg Oral QPC supper  vitamin B-12  1,000 mcg Oral Daily   Warfarin - Pharmacist Dosing Inpatient   Does not apply q1600   Infusions:   sodium chloride     PRN: sodium chloride, ALPRAZolam, ondansetron **OR** ondansetron (ZOFRAN) IV, sodium chloride flush Anti-infectives (From admission, onward)    None       Assessment: 89YOM with PMH of permanent atrial fibrillation on warfarin presenting with nausea, diarrhea, and weakness. Last week, patient presented with SOB and associated leg swelling last week, determined to be related to nonischemic cardiomyopathy with an EF of 20-25%. Estimated CHADS2VASC 4. Pharmacy consulted to assist with warfarin management while patient is admitted.   Patient admitted with multiple electrolyte abnormalities including transaminitis (AST/ALT 432/361) which appears new. Abdominal ultrasound suggestive of cirrhosis. Transaminitis may also be explained by hepatic congestion.   Warfarin PTA 5 mg Monday, Friday 2.5 mg all other days TWD = 22.5 mg  During admission last week, patient received the following:  Date    INR      Warfarin Dose  8/27     --          2.5 mg (PTA) 8/28     2.1       No dose given 8/29     1.9       5 mg 8/30     1.7       5 mg 8/31     1.6       5 mg 9/01     1.9       2.5 mg 09/02   2.2       '5mg'$  9/3       2    Warfarin dosing during this admission: Date INR Warfarin Dose  9/5 -- 5 mg (PTA)  9/6 2.5 1.'5mg'$   9/7 2.6    DDI Lokelma: increased concentration of warfarin (Given at 1000 daily) - separate administration by 2 hours Allopurinol: enhanced anticoagulant effect (PTA med continued)  Goal of Therapy:  INR 2-3   Plan:  --Given  signs of liver injury and likely poor oral intake from nausea, suspect patient will be more sensitive to warfarin, thus will give warfarin 1.5 mg x 1 today, roughly 20% decrease from home dose --Patient also with diarrhea, therefore also possibility for poor absorption --Daily INR per protocol --CBC at least every 3 days per protocol   Lu Duffel, PharmD, BCPS Clinical Pharmacist 12/14/2020 8:10 AM

## 2020-12-14 NOTE — Progress Notes (Addendum)
PROGRESS NOTE    [name]  [id number] DOB: 05-Dec-1931 DOA: 12/13/2020 PCP: Ria Bush, MD  Assessment & Plan:   Principal Problem:   Dehydration with hyponatremia Active Problems:   Anxiety   NICM (nonischemic cardiomyopathy) (HCC)   Chronic systolic heart failure (HCC)   Chronic constipation   Chronic atrial fibrillation (HCC)   HTN (hypertension)   CKD (chronic kidney disease), stage IIIa   Hyponatremia   Hyperkalemia   Generalized weakness   Lactic acidosis   Transaminitis     Dehydration: likely secondary to diuresis as well as GI losses from diarrhea. Cr is trending down slightly from day prior.    Lactic acidosis: secondary to diarrhea with no evidence of sepsis at this time  Chronic systolic CHF: echo from [id number] shows an LVEF of 20 to [id number], diastolic function is indeterminate. Continue to hold metolazone, torsemide and carvedilol    Acute hypoxic respiratory failure: likely secondary CHF. Weaned off of supplemental oxygen    Hypotension: secondary to overdiuresis as well as GI losses from diarrhea. Hold carvedilol and diuretics   Permanent a. Fib: rate controlled. Continue on coumadin.    Hyperkalemia: resolved   AKI on CKDIIIa: Cr is labile. Avoid nephrotoxic meds    Transaminitis: etiology unclear, likely secondary to cirrhosis. Abdominal ultrasound shows findings suggestive of cirrhosis. No alcohol use, possibly secondary to NASH   Normocytic anemia: H&H are labile. Will continue to monitor   Gout: continue on chronic dose of steroids    Thrombocytopenia: etiology unclear, likely secondary to cirrhosis. Will continue to monitor   DVT prophylaxis: warfarin  Code Status: DNR Family Communication: discussed pt's care w/ pt's family at bedside and answered their questions Disposition Plan: likely d/c back home  Level of care: Med-Surg  Status is: Observation  The patient remains OBS appropriate and will d/c before 2  midnights.  Dispo: The patient is from: Home              Anticipated d/c is to: Home              Patient currently is not medically stable to d/c.   Difficult to place patient unclear    Consultants:   Procedures:  Antimicrobials:   Subjective: Pt c/o fatigue  Objective: Vitals:   12/13/20 2051 12/13/20 2332 12/14/20 0610 12/14/20 0700  BP: 98/65 (!) 99/53 (!) 155/138   Pulse: 60 72 71   Resp: '17 16 19   '$ Temp: 98.4 F (36.9 C) 99.1 F (37.3 C) 98.7 F (37.1 C)   TempSrc:  Oral    SpO2: 92% 99% (!) 83% 93%  Weight:      Height:        Intake/Output Summary (Last 24 hours) at 12/14/2020 0730 Last data filed at 12/14/2020 0616 Gross per 24 hour  Intake 483.34 ml  Output 500 ml  Net -16.66 ml   Filed Weights   12/13/20 0729  Weight: 56.4 kg    Examination:  General exam: Appears calm and comfortable  Respiratory system: diminished breath sounds b/l  Cardiovascular system: S1 & S2 +. No rubs, gallops or clicks. Gastrointestinal system: Abdomen is nondistended, soft and nontender.  Hypoactive bowel sounds heard. Central nervous system: lethargic. Moves all extremities Psychiatry: Judgement and insight appear normal. Flat mood and affect    Data Reviewed: I have personally reviewed following labs and imaging studies  CBC: Recent Labs  Lab 12/09/20 0441 12/10/20 0439 12/13/20 0802 12/14/20 0442  WBC 7.3 6.9 10.5  6.1  NEUTROABS  --   --  8.2*  --   HGB 11.7* 12.1* 14.1 11.9*  HCT 33.8* 35.3* 42.5 33.8*  MCV 88.5 87.8 88.7 86.9  PLT 107* 112* 142* 94*   Basic Metabolic Panel: Recent Labs  Lab 12/08/20 0536 12/09/20 0441 12/10/20 0439 12/13/20 0802 12/14/20 0442  NA 132* 132* 136 129* 132*  K 3.4* 3.4* 3.9 5.5* 3.7  CL 96* 95* 97* 88* 98  CO2 28 28 32 25 26  GLUCOSE 115* 113* 94 114* 80  BUN 36* 36* 40* 51* 48*  CREATININE 1.39* 1.53* 1.71* 2.22* 2.00*  CALCIUM 8.5* 8.7* 8.8* 9.4 8.0*  MG  --  2.3  --   --   --    GFR: Estimated  Creatinine Clearance: 20.8 mL/min (A) (by C-G formula based on SCr of 2 mg/dL (H)). Liver Function Tests: Recent Labs  Lab 12/13/20 0802 12/14/20 0442  AST 432* 768*  ALT 361* 620*  ALKPHOS 107 79  BILITOT 2.7* 1.7*  PROT 6.4* 4.7*  ALBUMIN 3.9 2.9*   No results for input(s): LIPASE, AMYLASE in the last 168 hours. No results for input(s): AMMONIA in the last 168 hours. Coagulation Profile: Recent Labs  Lab 12/08/20 0536 12/09/20 0441 12/10/20 0439 12/13/20 0802 12/14/20 0442  INR 1.9* 2.2* 2.0* 2.5* 2.6*   Cardiac Enzymes: No results for input(s): CKTOTAL, CKMB, CKMBINDEX, TROPONINI in the last 168 hours. BNP (last 3 results) Recent Labs    09/01/20 1155  PROBNP 22,792*   HbA1C: No results for input(s): HGBA1C in the last 72 hours. CBG: No results for input(s): GLUCAP in the last 168 hours. Lipid Profile: No results for input(s): CHOL, HDL, LDLCALC, TRIG, CHOLHDL, LDLDIRECT in the last 72 hours. Thyroid Function Tests: No results for input(s): TSH, T4TOTAL, FREET4, T3FREE, THYROIDAB in the last 72 hours. Anemia Panel: No results for input(s): VITAMINB12, FOLATE, FERRITIN, TIBC, IRON, RETICCTPCT in the last 72 hours. Sepsis Labs: Recent Labs  Lab 12/13/20 0802 12/13/20 0953  LATICACIDVEN 5.4* 4.2*    Recent Results (from the past 240 hour(s))  Resp Panel by RT-PCR (Flu A&B, Covid) Nasopharyngeal Swab     Status: None   Collection Time: 12/05/20  4:44 AM   Specimen: Nasopharyngeal Swab; Nasopharyngeal(NP) swabs in vial transport medium  Result Value Ref Range Status   SARS Coronavirus 2 by RT PCR NEGATIVE NEGATIVE Final    Comment: (NOTE) SARS-CoV-2 target nucleic acids are NOT DETECTED.  The SARS-CoV-2 RNA is generally detectable in upper respiratory specimens during the acute phase of infection. The lowest concentration of SARS-CoV-2 viral copies this assay can detect is 138 copies/mL. A negative result does not preclude SARS-Cov-2 infection and should  not be used as the sole basis for treatment or other patient management decisions. A negative result may occur with  improper specimen collection/handling, submission of specimen other than nasopharyngeal swab, presence of viral mutation(s) within the areas targeted by this assay, and inadequate number of viral copies(<138 copies/mL). A negative result must be combined with clinical observations, patient history, and epidemiological information. The expected result is Negative.  Fact Sheet for Patients:  EntrepreneurPulse.com.au  Fact Sheet for Healthcare Providers:  IncredibleEmployment.be  This test is no t yet approved or cleared by the [place] FDA and  has been authorized for detection and/or diagnosis of SARS-CoV-2 by FDA under an Emergency Use Authorization (EUA). This EUA will remain  in effect (meaning this test can be used) for the duration of the COVID-19  declaration under Section 564(b)(1) of the Act, 21 U.S.C.section 360bbb-3(b)(1), unless the authorization is terminated  or revoked sooner.       Influenza A by PCR NEGATIVE NEGATIVE Final   Influenza B by PCR NEGATIVE NEGATIVE Final    Comment: (NOTE) The Xpert Xpress SARS-CoV-2/FLU/RSV plus assay is intended as an aid in the diagnosis of influenza from Nasopharyngeal swab specimens and should not be used as a sole basis for treatment. Nasal washings and aspirates are unacceptable for Xpert Xpress SARS-CoV-2/FLU/RSV testing.  Fact Sheet for Patients: EntrepreneurPulse.com.au  Fact Sheet for Healthcare Providers: IncredibleEmployment.be  This test is not yet approved or cleared by the [place] FDA and has been authorized for detection and/or diagnosis of SARS-CoV-2 by FDA under an Emergency Use Authorization (EUA). This EUA will remain in effect (meaning this test can be used) for the duration of the COVID-19 declaration under  Section 564(b)(1) of the Act, 21 U.S.C. section 360bbb-3(b)(1), unless the authorization is terminated or revoked.  Performed at [facility], [address] [place], [place] [address]   Gastrointestinal Panel by PCR , Stool     Status: None   Collection Time: 12/13/20 10:22 AM   Specimen: Stool  Result Value Ref Range Status   Campylobacter species NOT DETECTED NOT DETECTED Final   Plesimonas shigelloides NOT DETECTED NOT DETECTED Final   Salmonella species NOT DETECTED NOT DETECTED Final   Yersinia enterocolitica NOT DETECTED NOT DETECTED Final   Vibrio species NOT DETECTED NOT DETECTED Final   Vibrio cholerae NOT DETECTED NOT DETECTED Final   Enteroaggregative E coli (EAEC) NOT DETECTED NOT DETECTED Final   Enteropathogenic E coli (EPEC) NOT DETECTED NOT DETECTED Final   Enterotoxigenic E coli (ETEC) NOT DETECTED NOT DETECTED Final   Shiga like toxin producing E coli (STEC) NOT DETECTED NOT DETECTED Final   Shigella/Enteroinvasive E coli (EIEC) NOT DETECTED NOT DETECTED Final   Cryptosporidium NOT DETECTED NOT DETECTED Final   Cyclospora cayetanensis NOT DETECTED NOT DETECTED Final   Entamoeba histolytica NOT DETECTED NOT DETECTED Final   Giardia lamblia NOT DETECTED NOT DETECTED Final   Adenovirus F40/41 NOT DETECTED NOT DETECTED Final   Astrovirus NOT DETECTED NOT DETECTED Final   Norovirus GI/GII NOT DETECTED NOT DETECTED Final   Rotavirus A NOT DETECTED NOT DETECTED Final   Sapovirus (I, II, IV, and V) NOT DETECTED NOT DETECTED Final    Comment: Performed at [facility], [address] [place], [place] [address]  C Difficile Quick Screen w PCR reflex     Status: None   Collection Time: 12/13/20 10:22 AM   Specimen: Stool  Result Value Ref Range Status   C Diff antigen NEGATIVE NEGATIVE Final   C Diff toxin NEGATIVE NEGATIVE Final   C Diff interpretation No C. difficile detected.  Final    Comment: Performed at [facility], [address], [place], [place] [address]         Radiology Studies: DG Chest Portable 1 View  Result Date: 12/13/2020 CLINICAL DATA:  87 year old male with nausea and weakness. EXAM: PORTABLE CHEST 1 VIEW COMPARISON:  Chest radiographs 12/04/2020 and earlier. FINDINGS: Portable AP upright view at 0737 hours. Stable cardiomegaly and mediastinal contours. Visualized tracheal air column is within normal limits. Stable left chest AICD. A small right pleural effusion appears increased since March, stable since last month. No pneumothorax or pulmonary edema. No consolidation or areas of worsening ventilation. Previous right shoulder arthroplasty. No acute osseous abnormality identified. IMPRESSION:  1. Chronic cardiomegaly. Small pleural effusion(s) stable from last month. 2. No new cardiopulmonary abnormality. Electronically Signed   By: Genevie Ann M.D.   On: 12/13/2020 07:59   US Abdomen Limited RUQ (LIVER/GB)  Result Date: 12/13/2020 CLINICAL DATA:  Elevated LFTs. EXAM: ULTRASOUND ABDOMEN LIMITED RIGHT UPPER QUADRANT COMPARISON:  None. FINDINGS: Gallbladder: Surgically absent. Common bile duct: Diameter: 3 mm Liver: No focal lesion identified. Within normal limits in parenchymal echogenicity. Mildly nodular contour. Portal vein is patent on color Doppler imaging with normal direction of blood flow towards the liver. Other: Small amount of right upper quadrant ascites IMPRESSION: Small amount of right upper quadrant ascites. Mildly nodular liver contour, findings are suggestive of cirrhosis. Electronically Signed   By: Yetta Glassman M.D.   On: 12/13/2020 09:38        Scheduled Meds:  allopurinol  100 mg Oral Daily   azelastine  1 spray Each Nare BID   fluticasone  1 spray Each Nare Daily   montelukast  10 mg Oral QHS   predniSONE  10 mg Oral Q breakfast   sodium chloride flush  3 mL Intravenous Q12H   sodium zirconium cyclosilicate  10 g Oral Daily   tamsulosin  0.8 mg Oral QPC supper   vitamin B-12   1,000 mcg Oral Daily   Warfarin - Pharmacist Dosing Inpatient   Does not apply q1600   Continuous Infusions:  sodium chloride       LOS: 0 days    Time spent: 35 mins    Wyvonnia Dusky, MD [facility] Pager [phone]  If 7PM-7AM, please contact night-coverage 12/14/2020, 7:30 AM

## 2020-12-14 NOTE — Evaluation (Signed)
Physical Therapy Evaluation Patient Details Name: [name] MRN: [id number] DOB: Aug 07, 1931 Today's Date: 12/14/2020   History of Present Illness  87 y.o. male with medical history significant for chronic systolic CHF with EF of 0000000, hypertension, asthma, GERD, gout, anxiety, AICD placement, left bundle blockage, porphyruria, IBS, GI bleeding, diverticulitis, BPH, atrial fibrillation on Coumadin, anemia, aortic valve regurgitation, esophageal stricture, pulmonary hypertension, thrombocytopenia, CKD-3A, sinus infection, who was recently discharged from the hospital after hospitalization for acute exacerbation of his known CHF.  He presents to the emergency room for evaluation of nausea and weakness.  Clinical Impression  Pt received supine in bed, daughter in room and agreeable to therapy. Pt and daughter were quick to state they do not want HHPT out to the house, however pt is agreeable to PT while in the hospital. At baseline, pt lives with family who are available 24/7. He is Mod I with all ADLs and uses either a SPC or RW. Pt performed bed mobility Mod I. STS and 269f ambulation were performed with HHA x1 with CGA to steady. Pt did report fatigue upon completion of session. BLE are generally weak, 3+ to 4/5 with MMT bilaterally. Pt would benefit from continued PT to increase strength, functional strength and stability. PT is recommending OP PT - daughter is agreeable to the idea, pt is not. PT educated she would continue OP rec in the case pt changes his mind.     Follow Up Recommendations Outpatient PT;Supervision - Intermittent    Equipment Recommendations  None recommended by PT    Recommendations for Other Services       Precautions / Restrictions Precautions Precautions: Fall Restrictions Weight Bearing Restrictions: No      Mobility  Bed Mobility Overal bed mobility: Needs Assistance Bed Mobility: Supine to Sit;Sit to Supine     Supine to sit: HOB  elevated;Supervision Sit to supine: HOB elevated;Supervision   General bed mobility comments: Increased time and effort, used bed rails    Transfers Overall transfer level: Needs assistance Equipment used: 1 person hand held assist Transfers: Sit to/from Stand Sit to Stand: Min guard         General transfer comment: CGA to steady; 2 reps  Ambulation/Gait Ambulation/Gait assistance: Min guard Gait Distance (Feet): 200 Feet Assistive device: 1 person hand held assist Gait Pattern/deviations: WFL(Within Functional Limits);Step-through pattern;Decreased stride length;Trunk flexed;Narrow base of support Gait velocity: decreased   General Gait Details: 2094fwith HHA in place of cane. Decreased gait velocity, shuffle-like appearance however pt did clear BLE.  Stairs            Wheelchair Mobility    Modified Rankin (Stroke Patients Only)       Balance Overall balance assessment: Needs assistance Sitting-balance support: Feet supported;No upper extremity supported Sitting balance-Leahy Scale: Good Sitting balance - Comments: no sitting balance concerns   Standing balance support: During functional activity;Single extremity supported Standing balance-Leahy Scale: Fair Standing balance comment: Able to maintain static standing without HHA. Required HHAx1 during ambulation to stabilize.                             Pertinent Vitals/Pain Pain Assessment: No/denies pain    Home Living Family/patient expects to be discharged to:: Private residence Living Arrangements: Spouse/significant other Available Help at Discharge: Family;Available 24 hours/day Type of Home: House Home Access: Level entry     Home Layout: One level Home Equipment: Cane - single point;Walker - standard;Bedside  commode;Shower seat      Prior Function Level of Independence: Independent with assistive device(s)         Comments: Pt states he has been using SPC for mobility. Per  OT: pt has been using RW for functional mobility/transfers. He reports being Ind in self care and taking sponge baths. Pt does not drive.     Hand Dominance   Dominant Hand: Right    Extremity/Trunk Assessment   Upper Extremity Assessment Upper Extremity Assessment: Overall WFL for tasks assessed;Generalized weakness    Lower Extremity Assessment Lower Extremity Assessment: Overall WFL for tasks assessed;Generalized weakness (3+ to 4/5 throughout general BLE)       Communication   Communication: No difficulties  Cognition Arousal/Alertness: Awake/alert Behavior During Therapy: WFL for tasks assessed/performed Overall Cognitive Status: Within Functional Limits for tasks assessed                                 General Comments: A & O x4      General Comments General comments (skin integrity, edema, etc.): LE edema in B feet    Exercises Other Exercises Other Exercises: Discussed types of PT upon d/c and encouraged pt to consider OP PT - daughter in agreeance. Also discussed level of bed alarm and encouraged pt to sit up to the EOB for meals or transfer to chair with assist of nursing staff. Education provided on hospital-acquired weakness and decreased endurance due to recent hospitalization.   Assessment/Plan    PT Assessment Patient needs continued PT services  PT Problem List         PT Treatment Interventions Balance training;Gait training;Neuromuscular re-education;Functional mobility training;Therapeutic activities;Therapeutic exercise;Patient/family education    PT Goals (Current goals can be found in the Care Plan section)  Acute Rehab PT Goals Patient Stated Goal: to go home PT Goal Formulation: With patient/family Time For Goal Achievement: 12/28/20 Potential to Achieve Goals: Good    Frequency Min 2X/week   Barriers to discharge        Co-evaluation               AM-PAC PT "6 Clicks" Mobility  Outcome Measure Help needed  turning from your back to your side while in a flat bed without using bedrails?: None Help needed moving from lying on your back to sitting on the side of a flat bed without using bedrails?: None Help needed moving to and from a bed to a chair (including a wheelchair)?: A Little Help needed standing up from a chair using your arms (e.g., wheelchair or bedside chair)?: A Little Help needed to walk in hospital room?: A Little Help needed climbing 3-5 steps with a railing? : A Lot 6 Click Score: 19    End of Session Equipment Utilized During Treatment: Gait belt Activity Tolerance: Patient tolerated treatment well;Patient limited by fatigue Patient left: in bed;with call bell/phone within reach;with bed alarm set;with family/visitor present Nurse Communication: Mobility status PT Visit Diagnosis: Unsteadiness on feet (R26.81);Other abnormalities of gait and mobility (R26.89);Muscle weakness (generalized) (M62.81)    Time: [id number] PT Time Calculation (min) (ACUTE ONLY): 26 min   Charges:   PT Evaluation $PT Eval Moderate Complexity: 1 Mod PT Treatments $Therapeutic Activity: 8-22 mins        Patrina Levering PT, DPT 12/14/20 3:46 PM [id number]   [facility] Lab 12/14/2020, 3:40 PM

## 2020-12-14 NOTE — Progress Notes (Signed)
PT Cancellation Note  Patient Details Name: [name] MRN: [id number] DOB: Dec 27, 1931   Cancelled Treatment:    Reason Eval/Treat Not Completed: Fatigue/lethargy limiting ability to participate. Order received, pt chart reviewed. Pt received Xanex this morning. OT reports pt was very lethargic and was not able to fully participate in OT evaluation. Will reassess pt symptoms and attempt evaluation this afternoon as appropriate.    Patrina Levering PT, DPT 12/14/20 10:49 AM [phone]

## 2020-12-15 ENCOUNTER — Ambulatory Visit: Payer: Medicare HMO | Admitting: Family

## 2020-12-15 DIAGNOSIS — E860 Dehydration: Secondary | ICD-10-CM | POA: Diagnosis not present

## 2020-12-15 DIAGNOSIS — N1831 Chronic kidney disease, stage 3a: Secondary | ICD-10-CM | POA: Diagnosis not present

## 2020-12-15 DIAGNOSIS — R531 Weakness: Secondary | ICD-10-CM

## 2020-12-15 DIAGNOSIS — I482 Chronic atrial fibrillation, unspecified: Secondary | ICD-10-CM | POA: Diagnosis not present

## 2020-12-15 DIAGNOSIS — K7469 Other cirrhosis of liver: Secondary | ICD-10-CM | POA: Diagnosis not present

## 2020-12-15 DIAGNOSIS — I5022 Chronic systolic (congestive) heart failure: Secondary | ICD-10-CM | POA: Diagnosis not present

## 2020-12-15 DIAGNOSIS — N179 Acute kidney failure, unspecified: Secondary | ICD-10-CM | POA: Diagnosis not present

## 2020-12-15 DIAGNOSIS — E871 Hypo-osmolality and hyponatremia: Secondary | ICD-10-CM | POA: Diagnosis not present

## 2020-12-15 DIAGNOSIS — I428 Other cardiomyopathies: Secondary | ICD-10-CM | POA: Diagnosis not present

## 2020-12-15 DIAGNOSIS — R7401 Elevation of levels of liver transaminase levels: Secondary | ICD-10-CM | POA: Diagnosis not present

## 2020-12-15 DIAGNOSIS — E872 Acidosis: Secondary | ICD-10-CM | POA: Diagnosis not present

## 2020-12-15 DIAGNOSIS — I420 Dilated cardiomyopathy: Secondary | ICD-10-CM

## 2020-12-15 DIAGNOSIS — R7989 Other specified abnormal findings of blood chemistry: Secondary | ICD-10-CM | POA: Diagnosis not present

## 2020-12-15 LAB — MAGNESIUM: Magnesium: 1.8 mg/dL (ref 1.7–2.4)

## 2020-12-15 LAB — COMPREHENSIVE METABOLIC PANEL
ALT: 1229 U/L — ABNORMAL HIGH (ref 0–44)
AST: 1291 U/L — ABNORMAL HIGH (ref 15–41)
Albumin: 2.8 g/dL — ABNORMAL LOW (ref 3.5–5.0)
Alkaline Phosphatase: 74 U/L (ref 38–126)
Anion gap: 9 (ref 5–15)
BUN: 39 mg/dL — ABNORMAL HIGH (ref 8–23)
CO2: 28 mmol/L (ref 22–32)
Calcium: 8 mg/dL — ABNORMAL LOW (ref 8.9–10.3)
Chloride: 94 mmol/L — ABNORMAL LOW (ref 98–111)
Creatinine, Ser: 1.63 mg/dL — ABNORMAL HIGH (ref 0.61–1.24)
GFR, Estimated: 41 mL/min — ABNORMAL LOW (ref >60)
Glucose, Bld: 92 mg/dL (ref 70–99)
Potassium: 3.2 mmol/L — ABNORMAL LOW (ref 3.5–5.1)
Sodium: 131 mmol/L — ABNORMAL LOW (ref 135–145)
Total Bilirubin: 1.6 mg/dL — ABNORMAL HIGH (ref 0.3–1.2)
Total Protein: 5 g/dL — ABNORMAL LOW (ref 6.5–8.1)

## 2020-12-15 LAB — CBC
HCT: 34.6 % — ABNORMAL LOW (ref 39.0–52.0)
Hemoglobin: 11.8 g/dL — ABNORMAL LOW (ref 13.0–17.0)
MCH: 29.7 pg (ref 26.0–34.0)
MCHC: 34.1 g/dL (ref 30.0–36.0)
MCV: 87.2 fL (ref 80.0–100.0)
Platelets: 90 10*3/uL — ABNORMAL LOW (ref 150–400)
RBC: 3.97 MIL/uL — ABNORMAL LOW (ref 4.22–5.81)
RDW: 17.8 % — ABNORMAL HIGH (ref 11.5–15.5)
WBC: 5.1 10*3/uL (ref 4.0–10.5)
nRBC: 0 % (ref 0.0–0.2)

## 2020-12-15 LAB — PROTIME-INR
INR: 2.6 — ABNORMAL HIGH (ref 0.8–1.2)
Prothrombin Time: 27.7 seconds — ABNORMAL HIGH (ref 11.4–15.2)

## 2020-12-15 MED ORDER — POTASSIUM CHLORIDE CRYS ER 20 MEQ PO TBCR
20.0000 meq | EXTENDED_RELEASE_TABLET | Freq: Once | ORAL | Status: AC
  Administered 2020-12-15: 20 meq via ORAL
  Filled 2020-12-15: qty 1

## 2020-12-15 MED ORDER — WARFARIN SODIUM 2 MG PO TABS: 2.0000 mg | ORAL_TABLET | Freq: Once | ORAL | Status: DC

## 2020-12-15 MED ORDER — WARFARIN SODIUM 1 MG PO TABS
1.5000 mg | ORAL_TABLET | Freq: Once | ORAL | Status: AC
  Administered 2020-12-15: 17:00:00 1.5 mg via ORAL
  Filled 2020-12-15: qty 1

## 2020-12-15 MED ORDER — MORPHINE SULFATE (PF) 2 MG/ML IV SOLN: 1.0000 mg | INTRAVENOUS | Status: DC | PRN

## 2020-12-15 MED ORDER — OXYCODONE HCL 5 MG PO TABS
5.0000 mg | ORAL_TABLET | Freq: Four times a day (QID) | ORAL | Status: DC | PRN
  Filled 2020-12-15: qty 1

## 2020-12-15 NOTE — Progress Notes (Signed)
[place] for warfarin Indication: atrial fibrillation  Allergies  Allergen Reactions   Carafate [Sucralfate] Other (See Comments)    Burns stomach   Clarithromycin Nausea Only   Famotidine Other (See Comments)    ABD. PAIN   Levaquin [Levofloxacin] Other (See Comments)    Stomach pain, has to eat a lot of food   Omeprazole Diarrhea   Oxytetracycline Other (See Comments)    BUMPS   Penicillins Swelling    Amoxicillin ok, Pt states he had to have a shot to reverse the reaction Has patient had a PCN reaction causing immediate rash, facial/tongue/throat swelling, SOB or lightheadedness with hypotension: Yes Has patient had a PCN reaction causing severe rash involving mucus membranes or skin necrosis: No Has patient had a PCN reaction that required hospitalization No Has patient had a PCN reaction occurring within the last 10 years: Yes If all of the above answers are "NO", then may proceed with   Proton Pump Inhibitors Other (See Comments)    GI upset, diarrhea, gas, bloating   Ranitidine Diarrhea   Doxycycline Rash    Patient Measurements: Height: '5\' 4"'$  (162.6 cm) Weight: 56.4 kg (124 lb 6.4 oz) IBW/kg (Calculated) : 59.2   Vital Signs: Temp: 98.1 F (36.7 C) (09/08 0501) BP: 97/44 (09/08 0501) Pulse Rate: 85 (09/08 0501)  Labs: Recent Labs    12/13/20 0802 12/13/20 0953 12/14/20 0442 12/15/20 0630  HGB 14.1  --  11.9* 11.8*  HCT 42.5  --  33.8* 34.6*  PLT 142*  --  94* 90*  APTT 36  --   --   --   LABPROT 27.0*  --  27.4* 27.7*  INR 2.5*  --  2.6* 2.6*  CREATININE 2.22*  --  2.00* 1.63*  TROPONINIHS 9 11  --   --     Estimated Creatinine Clearance: 25.5 mL/min (A) (by C-G formula based on SCr of 1.63 mg/dL (H)).   Medical History: Past Medical History:  Diagnosis Date   AICD (automatic cardioverter/defibrillator) present    s/p gen change 04/2015 w/ MDT [name] Crt-D  DTBA1D1, Serial number [id number]   Allergic  rhinitis    [name]   Anemia    Anxiety    Arthritis    Atrial flutter ([place])    A.  Status post cardioversion; B.  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 Lucky Cowboy)   Chronic lung disease    spirometry 2015 - no obstruction, + mild restrictive lung disease   Chronic sinusitis    Chronic systolic congestive heart failure ([place])    Dyspnea    with exertion   Extrinsic asthma    [name]   Fall with injury 05/28/2017   GERD (gastroesophageal reflux disease) 2010   h/o esophageal stricture with dilation, LA grade C reflux esophagitis by EGD 2010   Goiter    History of diverticulitis of colon 06/2020   History of GI bleed    Secondary to hemorrhoids   History of seasonal allergies    Hypertension    IBS (irritable bowel syndrome)    LBBB (left bundle branch block)    Multiple pulmonary nodules 06/2013   RUL ([facility]. Adam Phenix at [facility]) - ?vasculitis as of last CT at [facility]   NICM (nonischemic cardiomyopathy) ([place])  Cardiac catheterization March 2006 without coronary disease; EF 25% 2018 Jan   Orthopnea    [facility])    Presence of permanent cardiac pacemaker    Sinus congestion 09/25/2010    Medications:  Medications Prior to Admission  Medication Sig Dispense Refill Last Dose   allopurinol (ZYLOPRIM) 100 MG tablet Take 100 mg by mouth daily. Per patient taking 2 tablets (200 mg)   12/12/2020 at 0800   ALPRAZolam (XANAX) 0.25 MG tablet TAKE 1/2 TO 1 TABLET (0.125-0.25 MG TOTAL) BY MOUTH AT BEDTIME AS NEEDED FOR ANXIETY OR SLEEP. (Patient taking differently: 0.125 mg 2 (two) times daily as needed for anxiety or sleep.) 30 tablet 1 12/12/2020 at 2000   Azelastine HCl 137 MCG/SPRAY SOLN Place 1 spray into both nostrils 2 (two) times daily.   12/12/2020 at 2000   carvedilol (COREG) 3.125 MG tablet Take 1 tablet (3.125  mg total) by mouth 2 (two) times daily with a meal. 60 tablet 1 12/12/2020 at 2000   fluticasone (FLONASE) 50 MCG/ACT nasal spray Place 1 spray into both nostrils daily.   12/12/2020 at 1500   montelukast (SINGULAIR) 10 MG tablet Take 10 mg by mouth at bedtime.   12/12/2020 at 1800   predniSONE (DELTASONE) 5 MG tablet Take 2 tablets (10 mg total) by mouth daily with breakfast.   12/12/2020 at 0800   tamsulosin (FLOMAX) 0.4 MG CAPS capsule Take 2 capsules (0.8 mg total) by mouth daily after supper. 90 capsule 7 12/12/2020 at 1800   torsemide 40 MG TABS torsemide 40 twice daily for weight 132 - 135lbs torsemide 60 twice daily for weight 136 - 139lbs torsemide 60 twice daily along with metolazone '5mg'$  daily for weight 140lbs or > 90 tablet 1 12/12/2020 at 0800   vitamin B-12 (CYANOCOBALAMIN) 500 MCG tablet Take 2 tablets (1,000 mcg total) by mouth daily.   12/12/2020 at 0800   warfarin (COUMADIN) 5 MG tablet TAKE 1/2 TO 1 TABLET BY MOUTH DAILY AS DIRECTED BY COUMADIN CLINIC (Patient taking differently: Take 5 mg by mouth 2 (two) times a week. Mondays and Fridays) 90 tablet 2 12/12/2020 at 1800   acetaminophen (TYLENOL) 500 MG tablet Take 250 mg by mouth daily as needed. Pain    unknown   alum & mag hydroxide-simeth (MAALOX/MYLANTA) 200-200-20 MG/5ML suspension Take 15 mLs by mouth daily as needed for indigestion or heartburn.   unknown at prn   metolazone (ZAROXOLYN) 2.5 MG tablet metolazone 2.'5mg'$  daily for weight < 140lbs metolazone 5 mg for weight 140lbs or > 30 tablet 1 prn at prn   polyethylene glycol (MIRALAX / GLYCOLAX) packet Take 17 g by mouth daily as needed (constipation).  (Patient not taking: No sig reported)      potassium chloride SA (KLOR-CON) 20 MEQ tablet Take 2 tablets (40 mEq total) by mouth 2 (two) times daily. 120 tablet 1 12/11/2020 at 2000   warfarin (COUMADIN) 5 MG tablet Take 2.5 mg by mouth as directed. 5 days weekly. All days except Monday and Friday (Patient not taking: No sig reported)   Not  Taking   Scheduled:   allopurinol  100 mg Oral Daily   azelastine  1 spray Each Nare BID   fluticasone  1 spray Each Nare Daily   montelukast  10 mg Oral QHS   potassium chloride  20 mEq Oral Once   predniSONE  10 mg Oral Q breakfast   sodium chloride flush  3 mL Intravenous Q12H   tamsulosin  0.8 mg  Oral QPC supper   torsemide  20 mg Oral Daily   vitamin B-12  1,000 mcg Oral Daily   Warfarin - Pharmacist Dosing Inpatient   Does not apply q1600   Infusions:   sodium chloride     PRN: sodium chloride, ALPRAZolam, morphine injection, ondansetron **OR** ondansetron (ZOFRAN) IV, oxyCODONE, sodium chloride flush Anti-infectives (From admission, onward)    None       Assessment: 89YOM with PMH of permanent atrial fibrillation on warfarin presenting with nausea, diarrhea, and weakness. Last week, patient presented with SOB and associated leg swelling last week, determined to be related to nonischemic cardiomyopathy with an EF of 20-25%. Estimated CHADS2VASC 4. Pharmacy consulted to assist with warfarin management while patient is admitted.   Patient admitted with multiple electrolyte abnormalities including transaminitis (AST/ALT 432/361) which appears new. Abdominal ultrasound suggestive of cirrhosis. Transaminitis may also be explained by hepatic congestion.   Warfarin PTA 5 mg Monday, Friday 2.5 mg all other days TWD = 22.5 mg  During admission last week, patient received the following:  Date    INR      Warfarin Dose  8/27     --          2.5 mg (PTA) 8/28     2.1       No dose given 8/29     1.9       5 mg 8/30     1.7       5 mg 8/31     1.6       5 mg 9/01     1.9       2.5 mg 09/02   2.2       '5mg'$  9/3       2    Warfarin dosing during this admission: Date INR Warfarin Dose  9/5 -- 5 mg (PTA)  9/6 2.5 1.5 mg  9/7 2.6 2.0 mg  9/8 2.6    DDI Lokelma: increased concentration of warfarin (Given at 1000 daily) - separate administration by 2 hours Allopurinol:  enhanced anticoagulant effect (PTA med continued)  Goal of Therapy:  INR 2-3   Plan:  --Given increasing signs of liver injury and likely poor oral intake from nausea, suspect patient will be more sensitive to warfarin, thus will give warfarin 2 mg x 1 today, roughly 20% decrease from home dose --Patient also with diarrhea, therefore also possibility for poor absorption --Daily INR per protocol --CBC at least every 3 days per protocol   Lu Duffel, PharmD, BCPS Clinical Pharmacist 12/15/2020 8:12 AM

## 2020-12-15 NOTE — Progress Notes (Signed)
Progress Note  Patient Name: [name] Date of Encounter: 12/15/2020  [facility] Cardiologist: [name], MD   Subjective   Reports he took too much MiraLAX at home leading to significant loose bowel movements and dehydration Has not had bowel movement in 2 days, starting to feel better Renal function improving, diuretics held Worsening LFTs, GI called Family at the bedside, long discussion concerning his presentation  Inpatient Medications    Scheduled Meds:  allopurinol  100 mg Oral Daily   azelastine  1 spray Each Nare BID   fluticasone  1 spray Each Nare Daily   montelukast  10 mg Oral QHS   predniSONE  10 mg Oral Q breakfast   sodium chloride flush  3 mL Intravenous Q12H   tamsulosin  0.8 mg Oral QPC supper   torsemide  20 mg Oral Daily   vitamin B-12  1,000 mcg Oral Daily   warfarin  1.5 mg Oral ONCE-1600   Warfarin - Pharmacist Dosing Inpatient   Does not apply q1600   Continuous Infusions:  sodium chloride     PRN Meds: sodium chloride, ALPRAZolam, morphine injection, ondansetron **OR** ondansetron (ZOFRAN) IV, oxyCODONE, sodium chloride flush   Vital Signs    Vitals:   12/15/20 0009 12/15/20 0501 12/15/20 0823 12/15/20 1148  BP: (!) 97/51 (!) 97/44 100/71 101/63  Pulse: 60 85 91 82  Resp: '17 20 18 16  '$ Temp: 97.7 F (36.5 C) 98.1 F (36.7 C) 97.9 F (36.6 C) 98.3 F (36.8 C)  TempSrc:   Oral   SpO2: 97% 94% 97% 98%  Weight:      Height:        Intake/Output Summary (Last 24 hours) at 12/15/2020 1503 Last data filed at 12/15/2020 1404 Gross per 24 hour  Intake 480 ml  Output 1950 ml  Net -1470 ml   Last 3 Weights 12/13/2020 12/10/2020 12/09/2020  Weight (lbs) 124 lb 6.4 oz 137 lb 2 oz 141 lb 8.6 oz  Weight (kg) 56.427 kg 62.2 kg 64.2 kg      Telemetry    Paced rhythm personally Reviewed  ECG    - Personally Reviewed  Physical Exam   GEN: No acute distress.   Neck: No JVD Cardiac: RRR, no murmurs, rubs, or gallops.   Respiratory: Clear to auscultation bilaterally. GI: Soft, nontender, non-distended  MS: No edema; No deformity. Neuro:  Nonfocal  Psych: Normal affect   Labs    High Sensitivity Troponin:   Recent Labs  Lab 12/04/20 1956 12/04/20 2242 12/13/20 0802 12/13/20 0953  TROPONINIHS '4 4 9 11      '$ Chemistry Recent Labs  Lab 12/13/20 0802 12/14/20 0442 12/15/20 0630  NA 129* 132* 131*  K 5.5* 3.7 3.2*  CL 88* 98 94*  CO2 '25 26 28  '$ GLUCOSE 114* 80 92  BUN 51* 48* 39*  CREATININE 2.22* 2.00* 1.63*  CALCIUM 9.4 8.0* 8.0*  PROT 6.4* 4.7* 5.0*  ALBUMIN 3.9 2.9* 2.8*  AST 432* 768* 1,291*  ALT 361* 620* 1,229*  ALKPHOS 107 79 74  BILITOT 2.7* 1.7* 1.6*  GFRNONAA 28* 32* 41*  ANIONGAP 16* 8 9     Hematology Recent Labs  Lab 12/13/20 0802 12/14/20 0442 12/15/20 0630  WBC 10.5 6.1 5.1  RBC 4.79 3.89* 3.97*  HGB 14.1 11.9* 11.8*  HCT 42.5 33.8* 34.6*  MCV 88.7 86.9 87.2  MCH 29.4 30.6 29.7  MCHC 33.2 35.2 34.1  RDW 18.3* 18.1* 17.8*  PLT 142* 94*  90*    BNP Recent Labs  Lab 12/13/20 0802  BNP 2,212.4*     DDimer No results for input(s): DDIMER in the last 168 hours.   Radiology    No results found.  Cardiac Studies   Echocardiogram December 06, 2020  1. Left ventricular ejection fraction, by estimation, is 20 to 25%. The  left ventricle has severely decreased function. The left ventricle  demonstrates global hypokinesis. Left ventricular diastolic parameters are  indeterminate. The average left  ventricular global longitudinal strain is -9.0 %. The global longitudinal  strain is abnormal.   2. Right ventricular systolic function is moderately reduced. The right  ventricular size is normal. There is mildly elevated pulmonary artery  systolic pressure.   3. Left atrial size was severely dilated.   4. Right atrial size was moderately dilated.   5. The mitral valve is abnormal. Mild mitral valve regurgitation.   6. The aortic valve was not well  visualized. Aortic valve regurgitation  is mild. No aortic stenosis is present.   7. The inferior vena cava is normal in size with <50% respiratory  variability, suggesting right atrial pressure of 8 mmHg.   Patient Profile     87 y.o. male with history of nonischemic cardiomyopathy, last EF 20 to 25%, s/p AICD, atrial flutter, GERD, pulmonary hypertension who presents with diarrhea, dehydration.  Being seen for cardiomyopathy.  Assessment & Plan    NICM EF 20-25% Recent hospitalization for acute on chronic systolic CHF Discharge in euvolemic state, had diarrhea at home Returns to hospital with dehydration, diuretic restarted at 20 mg daily Renal function close to his baseline Metolazone on hold Will look to titrate up torsemide only once his oral intake gets back to normal, currently minimal fluid intake per family sitting at his bedside -Cardiac medications limited/held secondary to hypotension   2.  Permanent atrial fibrillation -Warfarin INR 2.6, will need to be watched closely in the setting of transaminitis of unclear etiology   3.  S/p AICD -Appears to be working normally   4.  Dehydration, electrolyte abnormalities -Diarrhea presumed secondary to laxative use though in the setting of profound symptoms, now with transaminitis, etiology unclear -S/p IV fluids with improved renal function   Long discussion with 2 family members at the bedside concerning above  Total encounter time more than 35 minutes  Greater than 50% was spent in counseling and coordination of care with the patient   For questions or updates, please contact [facility] Please consult [email or web address] for contact info under        Signed, Ida Rogue, MD  12/15/2020, 3:03 PM

## 2020-12-15 NOTE — Progress Notes (Signed)
PROGRESS NOTE    [name]  [id number] DOB: 1932/02/01 DOA: 12/13/2020 PCP: Ria Bush, MD  Assessment & Plan:   Principal Problem:   Dehydration with hyponatremia Active Problems:   Anxiety   NICM (nonischemic cardiomyopathy) (HCC)   Chronic systolic heart failure (HCC)   Chronic constipation   Chronic atrial fibrillation (HCC)   HTN (hypertension)   CKD (chronic kidney disease), stage IIIa   Hyponatremia   Hyperkalemia   Generalized weakness   Lactic acidosis   Transaminitis     Dehydration: likely secondary to diuresis as well as GI losses from diarrhea. Cr continues to trend down daily. Resolved    Lactic acidosis: secondary to diarrhea with no evidence of sepsis at this time  Chronic systolic CHF: echo from [id number] shows an LVEF of 20 to [id number], diastolic function is indeterminate. Restarted home dose of torsemide as per cardio. Continue to hold metolazone, carvedilol    Acute hypoxic respiratory failure: likely secondary CHF.Resolved    Hypotension: secondary to overdiuresis as well as GI losses from diarrhea. Continue to hold carvedilol    Permanent a. fib: rate controlled. Continue on warfarin    Hyperkalemia: resolved   Hypokalemia: KCl given  AKI on CKDIIIa: Cr is trending down from day prior   Transaminitis: etiology unclear, likely secondary to cirrhosis. Trending up today. Avoid tylenol. Abd US shows findings suggestive of cirrhosis. No alcohol use, possibly secondary to NASH.  MELD score of 27. GI consulted    Normocytic anemia: H&H are labile. No need for a transfusion currently    Gout: continue on chronic dose of steroids    Thrombocytopenia: etiology unclear, likely secondary to cirrhosis. Will continue to monitor   DVT prophylaxis: warfarin  Code Status: DNR Family Communication: discussed pt's care w/ pt's family at bedside and answered their questions Disposition Plan: likely d/c back home  Level of care:  Med-Surg  Status is: Observation  The patient remains OBS appropriate and will d/c before 2 midnights.  Dispo: The patient is from: Home              Anticipated d/c is to: Home              Patient currently is not medically stable to d/c.   Difficult to place patient unclear    Consultants:   Procedures:  Antimicrobials:   Subjective: Pt c/o malaise   Objective: Vitals:   12/14/20 1539 12/14/20 1952 12/15/20 0009 12/15/20 0501  BP: (!) 89/58 (!) 99/54 (!) 97/51 (!) 97/44  Pulse: 92 73 60 85  Resp: '18 18 17 20  '$ Temp: 99.8 F (37.7 C) 98.2 F (36.8 C) 97.7 F (36.5 C) 98.1 F (36.7 C)  TempSrc:      SpO2: 92% 98% 97% 94%  Weight:      Height:        Intake/Output Summary (Last 24 hours) at 12/15/2020 [id number] Last data filed at 12/15/2020 0502 Gross per 24 hour  Intake 0 ml  Output 2025 ml  Net -2025 ml   Filed Weights   12/13/20 0729  Weight: 56.4 kg    Examination:  General exam: Appears frustrated  Respiratory system: decreased breath sounds b/l  Cardiovascular system: S1/S2+. No rubs or clicks Gastrointestinal system: Abd is soft, NT, ND & normal bowel sounds  Central nervous system: Awake and oriented. Moves all extremities  Psychiatry: Judgement and insight appear normal. Flat mood and affect     Data Reviewed: I have personally reviewed following  labs and imaging studies  CBC: Recent Labs  Lab 12/09/20 0441 12/10/20 0439 12/13/20 0802 12/14/20 0442 12/15/20 0630  WBC 7.3 6.9 10.5 6.1 5.1  NEUTROABS  --   --  8.2*  --   --   HGB 11.7* 12.1* 14.1 11.9* 11.8*  HCT 33.8* 35.3* 42.5 33.8* 34.6*  MCV 88.5 87.8 88.7 86.9 87.2  PLT 107* 112* 142* 94* 90*   Basic Metabolic Panel: Recent Labs  Lab 12/09/20 0441 12/10/20 0439 12/13/20 0802 12/14/20 0442 12/15/20 0630  NA 132* 136 129* 132* 131*  K 3.4* 3.9 5.5* 3.7 3.2*  CL 95* 97* 88* 98 94*  CO2 28 32 '25 26 28  '$ GLUCOSE 113* 94 114* 80 92  BUN 36* 40* 51* 48* 39*  CREATININE 1.53*  1.71* 2.22* 2.00* 1.63*  CALCIUM 8.7* 8.8* 9.4 8.0* 8.0*  MG 2.3  --   --   --  1.8   GFR: Estimated Creatinine Clearance: 25.5 mL/min (A) (by C-G formula based on SCr of 1.63 mg/dL (H)). Liver Function Tests: Recent Labs  Lab 12/13/20 0802 12/14/20 0442 12/15/20 0630  AST 432* 768* 1,291*  ALT 361* 620* 1,229*  ALKPHOS 107 79 74  BILITOT 2.7* 1.7* 1.6*  PROT 6.4* 4.7* 5.0*  ALBUMIN 3.9 2.9* 2.8*   No results for input(s): LIPASE, AMYLASE in the last 168 hours. No results for input(s): AMMONIA in the last 168 hours. Coagulation Profile: Recent Labs  Lab 12/09/20 0441 12/10/20 0439 12/13/20 0802 12/14/20 0442 12/15/20 0630  INR 2.2* 2.0* 2.5* 2.6* 2.6*   Cardiac Enzymes: No results for input(s): CKTOTAL, CKMB, CKMBINDEX, TROPONINI in the last 168 hours. BNP (last 3 results) Recent Labs    09/01/20 1155  PROBNP 22,792*   HbA1C: No results for input(s): HGBA1C in the last 72 hours. CBG: No results for input(s): GLUCAP in the last 168 hours. Lipid Profile: No results for input(s): CHOL, HDL, LDLCALC, TRIG, CHOLHDL, LDLDIRECT in the last 72 hours. Thyroid Function Tests: No results for input(s): TSH, T4TOTAL, FREET4, T3FREE, THYROIDAB in the last 72 hours. Anemia Panel: No results for input(s): VITAMINB12, FOLATE, FERRITIN, TIBC, IRON, RETICCTPCT in the last 72 hours. Sepsis Labs: Recent Labs  Lab 12/13/20 0802 12/13/20 0953  LATICACIDVEN 5.4* 4.2*    Recent Results (from the past 240 hour(s))  Gastrointestinal Panel by PCR , Stool     Status: None   Collection Time: 12/13/20 10:22 AM   Specimen: Stool  Result Value Ref Range Status   Campylobacter species NOT DETECTED NOT DETECTED Final   Plesimonas shigelloides NOT DETECTED NOT DETECTED Final   Salmonella species NOT DETECTED NOT DETECTED Final   Yersinia enterocolitica NOT DETECTED NOT DETECTED Final   Vibrio species NOT DETECTED NOT DETECTED Final   Vibrio cholerae NOT DETECTED NOT DETECTED Final    Enteroaggregative E coli (EAEC) NOT DETECTED NOT DETECTED Final   Enteropathogenic E coli (EPEC) NOT DETECTED NOT DETECTED Final   Enterotoxigenic E coli (ETEC) NOT DETECTED NOT DETECTED Final   Shiga like toxin producing E coli (STEC) NOT DETECTED NOT DETECTED Final   Shigella/Enteroinvasive E coli (EIEC) NOT DETECTED NOT DETECTED Final   Cryptosporidium NOT DETECTED NOT DETECTED Final   Cyclospora cayetanensis NOT DETECTED NOT DETECTED Final   Entamoeba histolytica NOT DETECTED NOT DETECTED Final   Giardia lamblia NOT DETECTED NOT DETECTED Final   Adenovirus F40/41 NOT DETECTED NOT DETECTED Final   Astrovirus NOT DETECTED NOT DETECTED Final   Norovirus GI/GII NOT DETECTED NOT DETECTED  Final   Rotavirus A NOT DETECTED NOT DETECTED Final   Sapovirus (I, II, IV, and V) NOT DETECTED NOT DETECTED Final    Comment: Performed at [facility], [place], [place] [address]  C Difficile Quick Screen w PCR reflex     Status: None   Collection Time: 12/13/20 10:22 AM   Specimen: Stool  Result Value Ref Range Status   C Diff antigen NEGATIVE NEGATIVE Final   C Diff toxin NEGATIVE NEGATIVE Final   C Diff interpretation No C. difficile detected.  Final    Comment: Performed at [facility], [address] [place], [place] [address]         Radiology Studies: DG Chest Portable 1 View  Result Date: 12/13/2020 CLINICAL DATA:  87 year old male with nausea and weakness. EXAM: PORTABLE CHEST 1 VIEW COMPARISON:  Chest radiographs 12/04/2020 and earlier. FINDINGS: Portable AP upright view at 0737 hours. Stable cardiomegaly and mediastinal contours. Visualized tracheal air column is within normal limits. Stable left chest AICD. A small right pleural effusion appears increased since March, stable since last month. No pneumothorax or pulmonary edema. No consolidation or areas of worsening ventilation. Previous right shoulder arthroplasty. No acute osseous abnormality  identified. IMPRESSION: 1. Chronic cardiomegaly. Small pleural effusion(s) stable from last month. 2. No new cardiopulmonary abnormality. Electronically Signed   By: Genevie Ann M.D.   On: 12/13/2020 07:59   US Abdomen Limited RUQ (LIVER/GB)  Result Date: 12/13/2020 CLINICAL DATA:  Elevated LFTs. EXAM: ULTRASOUND ABDOMEN LIMITED RIGHT UPPER QUADRANT COMPARISON:  None. FINDINGS: Gallbladder: Surgically absent. Common bile duct: Diameter: 3 mm Liver: No focal lesion identified. Within normal limits in parenchymal echogenicity. Mildly nodular contour. Portal vein is patent on color Doppler imaging with normal direction of blood flow towards the liver. Other: Small amount of right upper quadrant ascites IMPRESSION: Small amount of right upper quadrant ascites. Mildly nodular liver contour, findings are suggestive of cirrhosis. Electronically Signed   By: Yetta Glassman M.D.   On: 12/13/2020 09:38        Scheduled Meds:  allopurinol  100 mg Oral Daily   azelastine  1 spray Each Nare BID   fluticasone  1 spray Each Nare Daily   montelukast  10 mg Oral QHS   predniSONE  10 mg Oral Q breakfast   sodium chloride flush  3 mL Intravenous Q12H   tamsulosin  0.8 mg Oral QPC supper   torsemide  20 mg Oral Daily   vitamin B-12  1,000 mcg Oral Daily   Warfarin - Pharmacist Dosing Inpatient   Does not apply q1600   Continuous Infusions:  sodium chloride       LOS: 0 days    Time spent: 33 mins    Wyvonnia Dusky, MD [facility] Pager [phone]  If 7PM-7AM, please contact night-coverage 12/15/2020, 7:22 AM

## 2020-12-15 NOTE — Consult Note (Signed)
Consultation  Referring Provider:   Hospitalist   Admit date: 12/13/2020 Consult date: 12/15/2020         Reason for Consultation:     Transaminitis         HPI:   [name] is a 87 y.o. gentleman with HFrEF (EF 20-25%) and multiple other medical problems for which we were consulted for abnormal liver enzymes. Patient originally admitted two days ago for dehydration after taking too much miralax. Had nausea and some abdominal pain at that time. Since admission his liver enzymes have increased to over 1000 (both AST/ALT) today. He had some dizziness and lightheadness at home. He has never been a drinker or smoker and never done IV drugs. He was recently hospitalized for volume overload. Had Uncles with cirrhosis due to alcohol. Patient has had congestive heart failure for many years. There is some mention of severe pulmonary hypertension in one of his cardiology notes but can find a recent heart cath. Patient states he feels fine right now. His INR is elevated due to coumadin for a. Fib.   Past Medical History:  Diagnosis Date   AICD (automatic cardioverter/defibrillator) present    s/p gen change 04/2015 w/ MDT [name] Crt-D  DTBA1D1, Serial number [id number]   Allergic rhinitis    [name]   Anemia    Anxiety    Arthritis    Atrial flutter ([place])    A.  Status post cardioversion; B.  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 Lucky Cowboy)   Chronic lung disease    spirometry 2015 - no obstruction, + mild restrictive lung disease   Chronic sinusitis    Chronic systolic congestive heart failure ([place])    Dyspnea    with exertion   Extrinsic asthma    [name]   Fall with injury 05/28/2017   GERD (gastroesophageal reflux disease) 2010   h/o esophageal stricture with dilation, LA grade C reflux esophagitis by EGD 2010   Goiter     History of diverticulitis of colon 06/2020   History of GI bleed    Secondary to hemorrhoids   History of seasonal allergies    Hypertension    IBS (irritable bowel syndrome)    LBBB (left bundle branch block)    Multiple pulmonary nodules 06/2013   RUL ([facility]. [facility] Phenix [facility]) - ?vasculitis as of last CT at [facility]   NICM (nonischemic cardiomyopathy) [facility])    Cardiac catheterization March 2006 without coronary disease; EF 25% 2018 Jan   Orthopnea    [facility])    Presence of permanent cardiac pacemaker    Sinus congestion 09/25/2010    Past Surgical History:  Procedure Laterality Date   BACK SURGERY  1997   bulging disks   BiV-AICD implant     [employer]   CARDIAC CATHETERIZATION  06/22/04   Severe, nonischemic cardiomyopathy,EF 25-30%   CARDIOVERSION  02/22/09   AFlutter-([facility])   CATARACT EXTRACTION W/PHACO Right 12/21/2019   Procedure: CATARACT EXTRACTION PHACO AND INTRAOCULAR LENS PLACEMENT (IOC) RIGHT 2.23  00:21.9;  Surgeon: Eulogio Bear, MD;  Location: [place];  Service: Ophthalmology;  Laterality: Right;   CATARACT EXTRACTION W/PHACO Left 02/01/2020   Procedure: CATARACT EXTRACTION PHACO AND INTRAOCULAR LENS PLACEMENT (IOC) LEFT 3.67 00:39.5;  Surgeon: Eulogio Bear, MD;  Location: [place];  Service: Ophthalmology;  Laterality: Left;   CHOLECYSTECTOMY     COLONOSCOPY  10/99   Divertic, splenic, hepatic fleure only   COLONOSCOPY  10/21/08   aborted-divertics, int hemms (Dr. Vira Agar)   CORONARY ANGIOPLASTY     EP IMPLANTABLE DEVICE N/A 04/25/2015   Procedure: BIV ICD Generator Changeout;  Surgeon: Evans Lance, MD;  Location: [place] [facility];  Service: Cardiovascular;  Laterality: N/A;   ESOPHAGEAL DILATION  02/18/98   ESOPHAGOGASTRODUODENOSCOPY  01/1998   stricture, sliding HH, GERD   ESOPHAGOGASTRODUODENOSCOPY  04/08/01   stricture, gastritis, HH, GERD-no dilation(Dr. Henrene Pastor)   ESOPHAGOGASTRODUODENOSCOPY  12/22/04    stricture; gastritis; duodenitis, GERD   ESOPHAGOGASTRODUODENOSCOPY  10/21/08   Reflux esophagitis; Erythem. Duod. (Dr. Vira Agar)   ESOPHAGOGASTRODUODENOSCOPY  08/2013   erythema gastric fundus - minimal gastritis, HH ([place])   ESOPHAGOGASTRODUODENOSCOPY  08/2016   dysphagia - mod schatzki rin, dilated Tiffany Kocher)   ESOPHAGOGASTRODUODENOSCOPY (EGD) WITH PROPOFOL N/A 08/24/2016   mod schatzki ring dilated, duodenal deformity Manya Silvas, MD)   goiter removal     HERNIA REPAIR     HERNIA REPAIR  01/30/02   Dr. Smith Mince / REPLACE / REMOVE PACEMAKER  2007 and 2017   JOINT REPLACEMENT Right    shoulder   NOSE SURGERY  1971   sinus surgery   PENILE PROSTHESIS IMPLANT  2007   [place]   PFT  10/2013   FVC 61%, FEV1 63%, ratio 0.76   Pulmonary eval  04/2002   ([facility]) Chronic congestive symptoms   REVERSE SHOULDER ARTHROPLASTY Right 01/17/2016   Procedure: REVERSE SHOULDER ARTHROPLASTY;  Surgeon: Corky Mull, MD;  Location: [facility];  Service: Orthopedics;  Laterality: Right;   REVERSE TOTAL SHOULDER ARTHROPLASTY Right 01/2016   Poggi   US ECHOCARDIOGRAPHY  07/13/04   EF 25-30%, Mod LVH; LA severe dilation; Mild AR; IRTR   US ECHOCARDIOGRAPHY  11/27/06   hypokinesis posterior wall, EF 35%; mild AR    Family History  Problem Relation Age of Onset   Coronary artery disease Father    Hypertension Father    Heart failure Mother    Dysphagia Sister    Breast cancer Other    Ovarian cancer Other    Uterine cancer Other    Other Sister        stomach problems   Other Sister        stomach problems   Other Sister        stomach problems   Alcohol abuse Maternal Uncle     Social History   Tobacco Use   Smoking status: Never   Smokeless tobacco: Never  Vaping Use   Vaping Use: Never used  Substance Use Topics   Alcohol use: No   Drug use: No    Prior to Admission medications   Medication Sig Start Date End Date Taking? Authorizing Provider  allopurinol (ZYLOPRIM) 100 MG  tablet Take 100 mg by mouth daily. Per patient taking 2 tablets (200 mg)   Yes [provider]  ALPRAZolam (XANAX) 0.25 MG tablet TAKE 1/2 TO 1 TABLET (0.125-0.25 MG TOTAL) BY MOUTH AT BEDTIME AS NEEDED FOR ANXIETY OR SLEEP. Patient taking differently: 0.125 mg 2 (two) times daily as needed for anxiety or sleep. 11/25/20  Yes Ria Bush, MD  Azelastine HCl 137 MCG/SPRAY SOLN Place 1 spray into both nostrils 2 (two) times daily. 11/27/20  Yes [provider]  carvedilol (COREG) 3.125 MG tablet Take  1 tablet (3.125 mg total) by mouth 2 (two) times daily with a meal. 12/10/20  Yes Cherene Altes, MD  fluticasone (FLONASE) 50 MCG/ACT nasal spray Place 1 spray into both nostrils daily.   Yes [provider]  montelukast (SINGULAIR) 10 MG tablet Take 10 mg by mouth at bedtime.   Yes [provider]  predniSONE (DELTASONE) 5 MG tablet Take 2 tablets (10 mg total) by mouth daily with breakfast. 12/10/20  Yes Cherene Altes, MD  tamsulosin (FLOMAX) 0.4 MG CAPS capsule Take 2 capsules (0.8 mg total) by mouth daily after supper. 07/06/20  Yes Billey Co, MD  torsemide 40 MG TABS torsemide 40 twice daily for weight 132 - 135lbs torsemide 60 twice daily for weight 136 - 139lbs torsemide 60 twice daily along with metolazone '5mg'$  daily for weight 140lbs or > 12/10/20  Yes Cherene Altes, MD  vitamin B-12 (CYANOCOBALAMIN) 500 MCG tablet Take 2 tablets (1,000 mcg total) by mouth daily. 11/16/19  Yes Ria Bush, MD  warfarin (COUMADIN) 5 MG tablet TAKE 1/2 TO 1 TABLET BY MOUTH DAILY AS DIRECTED BY COUMADIN CLINIC Patient taking differently: Take 5 mg by mouth 2 (two) times a week. Mondays and Fridays 12/30/19  Yes Evans Lance, MD  acetaminophen (TYLENOL) 500 MG tablet Take 250 mg by mouth daily as needed. Pain     [provider]  alum & mag hydroxide-simeth (MAALOX/MYLANTA) 200-200-20 MG/5ML suspension Take 15 mLs by mouth daily as needed for  indigestion or heartburn.    [provider]  metolazone (ZAROXOLYN) 2.5 MG tablet metolazone 2.'5mg'$  daily for weight < 140lbs metolazone 5 mg for weight 140lbs or > 12/10/20   Cherene Altes, MD  polyethylene glycol (MIRALAX / GLYCOLAX) packet Take 17 g by mouth daily as needed (constipation).  Patient not taking: No sig reported    [provider]  potassium chloride SA (KLOR-CON) 20 MEQ tablet Take 2 tablets (40 mEq total) by mouth 2 (two) times daily. 12/10/20   Cherene Altes, MD  warfarin (COUMADIN) 5 MG tablet Take 2.5 mg by mouth as directed. 5 days weekly. All days except Monday and Friday Patient not taking: No sig reported    [provider]    Current Facility-Administered Medications  Medication Dose Route Frequency Provider Last Rate Last Admin   0.9 %  sodium chloride infusion  250 mL Intravenous PRN Agbata, Tochukwu, MD       allopurinol (ZYLOPRIM) tablet 100 mg  100 mg Oral Daily Agbata, Tochukwu, MD   100 mg at 12/15/20 0825   ALPRAZolam (XANAX) tablet 0.125 mg  0.125 mg Oral BID PRN Agbata, Tochukwu, MD   0.125 mg at 12/15/20 0830   azelastine (ASTELIN) 0.1 % nasal spray 1 spray  1 spray Each Nare BID Agbata, Tochukwu, MD   1 spray at 12/15/20 0831   fluticasone (FLONASE) 50 MCG/ACT nasal spray 1 spray  1 spray Each Nare Daily Agbata, Tochukwu, MD   1 spray at 12/15/20 0832   montelukast (SINGULAIR) tablet 10 mg  10 mg Oral QHS Agbata, Tochukwu, MD   10 mg at 12/14/20 2151   morphine 2 MG/ML injection 1 mg  1 mg Intravenous Q4H PRN Wyvonnia Dusky, MD       ondansetron [facility]) tablet 4 mg  4 mg Oral Q6H PRN Agbata, Tochukwu, MD       Or   ondansetron (ZOFRAN) injection 4 mg  4 mg Intravenous Q6H PRN Agbata, Tochukwu,  MD   4 mg at 12/13/20 1741   oxyCODONE (Oxy IR/ROXICODONE) immediate release tablet 5 mg  5 mg Oral Q6H PRN Wyvonnia Dusky, MD       predniSONE (DELTASONE) tablet 10 mg  10 mg Oral Q breakfast Agbata, Tochukwu, MD   10 mg  at 12/15/20 0825   sodium chloride flush (NS) 0.9 % injection 3 mL  3 mL Intravenous Q12H Agbata, Tochukwu, MD   3 mL at 12/15/20 [id number]   sodium chloride flush (NS) 0.9 % injection 3 mL  3 mL Intravenous PRN Agbata, Tochukwu, MD       tamsulosin (FLOMAX) capsule 0.8 mg  0.8 mg Oral QPC supper Agbata, Tochukwu, MD   0.8 mg at 12/14/20 1804   torsemide (DEMADEX) tablet 20 mg  20 mg Oral Daily Kate Sable, MD   20 mg at 12/15/20 0825   vitamin B-12 (CYANOCOBALAMIN) tablet 1,000 mcg  1,000 mcg Oral Daily Agbata, Tochukwu, MD   1,000 mcg at 12/15/20 0825   warfarin (COUMADIN) tablet 1.5 mg  1.5 mg Oral ONCE-1600 Shanlever, Pierce Crane, [facility]       Warfarin - Pharmacist Dosing Inpatient   Does not apply q1600 Dorothe Pea, [facility]        Allergies as of 12/13/2020 - Review Complete 12/13/2020  Allergen Reaction Noted   Carafate [sucralfate] Other (See Comments) 07/12/2016   Clarithromycin Nausea Only 12/16/2006   Famotidine Other (See Comments) 12/16/2006   Levaquin [levofloxacin] Other (See Comments) 04/11/2015   Omeprazole Diarrhea 12/16/2006   Oxytetracycline Other (See Comments) 12/16/2006   Penicillins Swelling 12/16/2006   Proton pump inhibitors Other (See Comments) 10/21/2011   Ranitidine Diarrhea 12/08/2008   Doxycycline Rash 08/27/2014     Review of Systems:    All systems reviewed and negative except where noted in HPI.  Review of Systems  Constitutional:  Negative for chills and fever.  HENT:  Positive for hearing loss.   Respiratory:  Negative for cough.   Cardiovascular:  Negative for chest pain.  Gastrointestinal:  Negative for abdominal pain, blood in stool, constipation, diarrhea, melena, nausea and vomiting.  Musculoskeletal:  Positive for joint pain.  Neurological:  Negative for focal weakness.  Psychiatric/Behavioral:  Negative for substance abuse.   All other systems reviewed and are negative.     Physical Exam:  Vital signs in last 24 hours: Temp:  [97.7 F  (36.5 C)-98.3 F (36.8 C)] 98.3 F (36.8 C) (09/08 1148) Pulse Rate:  [60-91] 82 (09/08 1148) Resp:  [16-20] 16 (09/08 1148) BP: (97-101)/(44-71) 101/63 (09/08 1148) SpO2:  [94 %-98 %] 98 % (09/08 1148) Last BM Date: 12/13/20 General:   Pleasant in NAD Head:  Normocephalic and atraumatic. Eyes:   No icterus.   Conjunctiva pink. Mouth: Mucosa pink moist, no lesions. Neck:  Supple; no masses felt Lungs:  No respiratory distress Abdomen:  soft, slightly distended Rectal:  Not performed.  Msk:  Warm extremities, 1+ edema Neurologic:  Alert and  oriented x4;  Cranial nerves II-XII intact.  Skin:  Warm, dry, pink without significant lesions or rashes. Psych:  Alert and cooperative. Normal affect.  LAB RESULTS: Recent Labs    12/13/20 0802 12/14/20 0442 12/15/20 0630  WBC 10.5 6.1 5.1  HGB 14.1 11.9* 11.8*  HCT 42.5 33.8* 34.6*  PLT 142* 94* 90*   BMET Recent Labs    12/13/20 0802 12/14/20 0442 12/15/20 0630  NA 129* 132* 131*  K 5.5* 3.7 3.2*  CL 88* 98 94*  CO2 '25 26 28  '$ GLUCOSE 114* 80 92  BUN 51* 48* 39*  CREATININE 2.22* 2.00* 1.63*  CALCIUM 9.4 8.0* 8.0*   LFT Recent Labs    12/15/20 0630  PROT 5.0*  ALBUMIN 2.8*  AST 1,291*  ALT 1,229*  ALKPHOS 74  BILITOT 1.6*   PT/INR Recent Labs    12/14/20 0442 12/15/20 0630  LABPROT 27.4* 27.7*  INR 2.6* 2.6*    STUDIES: No results found.     Impression / Plan:   87 y/o gentleman with history of HFrEF with EF of 20-25% who presented with dehydration and has had progressive rise of enzymes during this hospitalization when he has had previously normal liver enzymes. Given history of advanced heart failure and trend of liver enzymes, this is most consistent with ischemic hepatopathy  - avoid hypotension as much as possible - management of heart failure per cardiology - trend liver enzymes, anticipate worsening before they improve which occurs over 7-10 days assuming no further hypotension - recommend  checking acute hepatitis panel for completeness sake - will not check auto-immune markers at this time given based on history this is most consistent with ischemic hepatopathy - he likely has cirrhosis secondary to long standing heart failure vs NAFLD but regardless, this wouldn't change management at this time  Will continue to follow. Please call with any concerns or questions.  Raylene Miyamoto MD, MPH [place]

## 2020-12-16 DIAGNOSIS — I5022 Chronic systolic (congestive) heart failure: Secondary | ICD-10-CM | POA: Diagnosis not present

## 2020-12-16 DIAGNOSIS — R7989 Other specified abnormal findings of blood chemistry: Secondary | ICD-10-CM | POA: Diagnosis not present

## 2020-12-16 DIAGNOSIS — E860 Dehydration: Secondary | ICD-10-CM | POA: Diagnosis not present

## 2020-12-16 DIAGNOSIS — E872 Acidosis: Secondary | ICD-10-CM | POA: Diagnosis not present

## 2020-12-16 DIAGNOSIS — N1831 Chronic kidney disease, stage 3a: Secondary | ICD-10-CM | POA: Diagnosis not present

## 2020-12-16 DIAGNOSIS — R7401 Elevation of levels of liver transaminase levels: Secondary | ICD-10-CM | POA: Diagnosis not present

## 2020-12-16 DIAGNOSIS — N179 Acute kidney failure, unspecified: Secondary | ICD-10-CM | POA: Diagnosis not present

## 2020-12-16 DIAGNOSIS — I482 Chronic atrial fibrillation, unspecified: Secondary | ICD-10-CM | POA: Diagnosis not present

## 2020-12-16 DIAGNOSIS — E871 Hypo-osmolality and hyponatremia: Secondary | ICD-10-CM | POA: Diagnosis not present

## 2020-12-16 DIAGNOSIS — R531 Weakness: Secondary | ICD-10-CM | POA: Diagnosis not present

## 2020-12-16 DIAGNOSIS — I420 Dilated cardiomyopathy: Secondary | ICD-10-CM | POA: Diagnosis not present

## 2020-12-16 DIAGNOSIS — K7469 Other cirrhosis of liver: Secondary | ICD-10-CM | POA: Diagnosis not present

## 2020-12-16 DIAGNOSIS — I428 Other cardiomyopathies: Secondary | ICD-10-CM | POA: Diagnosis not present

## 2020-12-16 LAB — PROTIME-INR
INR: 2 — ABNORMAL HIGH (ref 0.8–1.2)
Prothrombin Time: 22.6 seconds — ABNORMAL HIGH (ref 11.4–15.2)

## 2020-12-16 LAB — CBC
HCT: 35.4 % — ABNORMAL LOW (ref 39.0–52.0)
Hemoglobin: 12.2 g/dL — ABNORMAL LOW (ref 13.0–17.0)
MCH: 30 pg (ref 26.0–34.0)
MCHC: 34.5 g/dL (ref 30.0–36.0)
MCV: 87 fL (ref 80.0–100.0)
Platelets: 79 10*3/uL — ABNORMAL LOW (ref 150–400)
RBC: 4.07 MIL/uL — ABNORMAL LOW (ref 4.22–5.81)
RDW: 17.6 % — ABNORMAL HIGH (ref 11.5–15.5)
WBC: 5 10*3/uL (ref 4.0–10.5)
nRBC: 0 % (ref 0.0–0.2)

## 2020-12-16 LAB — COMPREHENSIVE METABOLIC PANEL
ALT: 1069 U/L — ABNORMAL HIGH (ref 0–44)
AST: 740 U/L — ABNORMAL HIGH (ref 15–41)
Albumin: 3.1 g/dL — ABNORMAL LOW (ref 3.5–5.0)
Alkaline Phosphatase: 79 U/L (ref 38–126)
Anion gap: 12 (ref 5–15)
BUN: 36 mg/dL — ABNORMAL HIGH (ref 8–23)
CO2: 28 mmol/L (ref 22–32)
Calcium: 8.4 mg/dL — ABNORMAL LOW (ref 8.9–10.3)
Chloride: 92 mmol/L — ABNORMAL LOW (ref 98–111)
Creatinine, Ser: 1.44 mg/dL — ABNORMAL HIGH (ref 0.61–1.24)
GFR, Estimated: 47 mL/min — ABNORMAL LOW (ref >60)
Glucose, Bld: 89 mg/dL (ref 70–99)
Potassium: 3.8 mmol/L (ref 3.5–5.1)
Sodium: 132 mmol/L — ABNORMAL LOW (ref 135–145)
Total Bilirubin: 1.5 mg/dL — ABNORMAL HIGH (ref 0.3–1.2)
Total Protein: 5.4 g/dL — ABNORMAL LOW (ref 6.5–8.1)

## 2020-12-16 MED ORDER — TORSEMIDE 20 MG PO TABS: ORAL_TABLET | ORAL | 0 refills | Status: AC

## 2020-12-16 MED ORDER — WARFARIN SODIUM 5 MG PO TABS
5.0000 mg | ORAL_TABLET | Freq: Once | ORAL | Status: DC
  Filled 2020-12-16: qty 1

## 2020-12-16 NOTE — Progress Notes (Signed)
Progress Note  Patient Name: [name] Date of Encounter: 12/16/2020  [facility] Cardiologist: [name], MD   Subjective   Reports he took too much MiraLAX at home leading to significant loose bowel movements and dehydration Has not had bowel movement in 2 days, starting to feel better Renal function improving, diuretics held Worsening LFTs, GI called Family at the bedside, long discussion concerning his presentation  Inpatient Medications    Scheduled Meds:  allopurinol  100 mg Oral Daily   azelastine  1 spray Each Nare BID   fluticasone  1 spray Each Nare Daily   montelukast  10 mg Oral QHS   predniSONE  10 mg Oral Q breakfast   sodium chloride flush  3 mL Intravenous Q12H   tamsulosin  0.8 mg Oral QPC supper   torsemide  20 mg Oral Daily   vitamin B-12  1,000 mcg Oral Daily   warfarin  5 mg Oral ONCE-1600   Warfarin - Pharmacist Dosing Inpatient   Does not apply q1600   Continuous Infusions:  sodium chloride     PRN Meds: sodium chloride, ALPRAZolam, morphine injection, ondansetron **OR** ondansetron (ZOFRAN) IV, oxyCODONE, sodium chloride flush   Vital Signs    Vitals:   12/15/20 1933 12/15/20 2353 12/16/20 0412 12/16/20 0953  BP: 97/60 (!) 94/59 97/69 102/74  Pulse: (!) 58 87 (!) 55 (!) 56  Resp: '18 18 19   '$ Temp: 98.3 F (36.8 C) 98.5 F (36.9 C) 98.3 F (36.8 C) 98.6 F (37 C)  TempSrc:    Oral  SpO2: 96% 93% 96% 94%  Weight:      Height:        Intake/Output Summary (Last 24 hours) at 12/16/2020 1215 Last data filed at 12/16/2020 1017 Gross per 24 hour  Intake 720 ml  Output 750 ml  Net -30 ml   Last 3 Weights 12/13/2020 12/10/2020 12/09/2020  Weight (lbs) 124 lb 6.4 oz 137 lb 2 oz 141 lb 8.6 oz  Weight (kg) 56.427 kg 62.2 kg 64.2 kg      Telemetry    Paced rhythm personally Reviewed  ECG    - Personally Reviewed  Physical Exam   GEN: No acute distress.   Neck: No JVD Cardiac: RRR, no murmurs, rubs, or gallops.   Respiratory: Clear to auscultation bilaterally. GI: Soft, nontender, non-distended  MS: No edema; No deformity. Neuro:  Nonfocal  Psych: Normal affect   Labs    High Sensitivity Troponin:   Recent Labs  Lab 12/04/20 1956 12/04/20 2242 12/13/20 0802 12/13/20 0953  TROPONINIHS '4 4 9 11      '$ Chemistry Recent Labs  Lab 12/14/20 0442 12/15/20 0630 12/16/20 0625  NA 132* 131* 132*  K 3.7 3.2* 3.8  CL 98 94* 92*  CO2 '26 28 28  '$ GLUCOSE 80 92 89  BUN 48* 39* 36*  CREATININE 2.00* 1.63* 1.44*  CALCIUM 8.0* 8.0* 8.4*  PROT 4.7* 5.0* 5.4*  ALBUMIN 2.9* 2.8* 3.1*  AST 768* 1,291* 740*  ALT 620* 1,229* 1,069*  ALKPHOS 79 74 79  BILITOT 1.7* 1.6* 1.5*  GFRNONAA 32* 41* 47*  ANIONGAP '8 9 12     '$ Hematology Recent Labs  Lab 12/14/20 0442 12/15/20 0630 12/16/20 0625  WBC 6.1 5.1 5.0  RBC 3.89* 3.97* 4.07*  HGB 11.9* 11.8* 12.2*  HCT 33.8* 34.6* 35.4*  MCV 86.9 87.2 87.0  MCH 30.6 29.7 30.0  MCHC 35.2 34.1 34.5  RDW 18.1* 17.8* 17.6*  PLT  94* 90* 79*    BNP Recent Labs  Lab 12/13/20 0802  BNP 2,212.4*     DDimer No results for input(s): DDIMER in the last 168 hours.   Radiology    No results found.  Cardiac Studies   Echocardiogram December 06, 2020  1. Left ventricular ejection fraction, by estimation, is 20 to 25%. The  left ventricle has severely decreased function. The left ventricle  demonstrates global hypokinesis. Left ventricular diastolic parameters are  indeterminate. The average left  ventricular global longitudinal strain is -9.0 %. The global longitudinal  strain is abnormal.   2. Right ventricular systolic function is moderately reduced. The right  ventricular size is normal. There is mildly elevated pulmonary artery  systolic pressure.   3. Left atrial size was severely dilated.   4. Right atrial size was moderately dilated.   5. The mitral valve is abnormal. Mild mitral valve regurgitation.   6. The aortic valve was not well  visualized. Aortic valve regurgitation  is mild. No aortic stenosis is present.   7. The inferior vena cava is normal in size with <50% respiratory  variability, suggesting right atrial pressure of 8 mmHg.   Patient Profile     87 y.o. male with history of nonischemic cardiomyopathy, last EF 20 to 25%, s/p AICD, atrial flutter, GERD, pulmonary hypertension who presents with diarrhea, dehydration.  Being seen for cardiomyopathy.  Assessment & Plan    NICM EF 20-25% Recent hospitalization for acute on chronic systolic CHF in the setting of poor diet, high salt intake He was discharged in euvolemic state, had diarrhea at home/possible excessive MiraLAX Return to the hospital with dehydration, required IV fluids Renal function improved to his baseline Restarted on metolazone 20 mg past 2 days -We have recommended torsemide sliding scale as below  weight 130 or less no torsemide,  weight 131 to 132 torsemide 20,  weight 133-137 torsemide 40,  weight 138-140 torsemide 40 twice daily,  over 140 take metolazone in the morning with torsemide 40 twice a day We have ordered CMP for next week when he comes in for Coumadin clinic in [place] and then follow-up with Dr. [name] in [place] his primary cardiologist  2.  Permanent atrial fibrillation -Warfarin Plan for INR check next Wednesday in the office  3.  S/p AICD -Appears to be working normally   4.  Dehydration, electrolyte abnormalities -Diarrhea presumed secondary  -S/p IV fluids with improved renal function Now back to baseline  5.  Transaminitis Improving numbers, etiology unclear, did not have cardiac congestion this admission   Discharge instructions discussed with patient, discussed diuretic regiment in detail  Total encounter time more than 35 minutes  Greater than 50% was spent in counseling and coordination of care with the patient   For questions or updates, please contact [place] Please consult  [email or web address] for contact info under        Signed, Ida Rogue, MD  12/16/2020, 12:15 PM

## 2020-12-16 NOTE — Discharge Summary (Addendum)
Physician Discharge Summary  [name] [id number] DOB: 09/13/1931 DOA: 12/13/2020  PCP: [name] Bush, MD  Admit date: 12/13/2020 Discharge date: 12/16/2020  Admitted From: home  Disposition:  home   Recommendations for Outpatient Follow-up:  Follow up with PCP in 1-2 weeks F/u w/ GI, Dr. Haig Prophet, in 1-2 weeks F/u w/ cardio, Dr. [name], in 1 week Needs a CMP to f/u on Cr & liver functions within 1 week  Home Health: no  Equipment/Devices:  Discharge Condition: stable  CODE STATUS:  DNR  Diet recommendation: Heart Healthy  Brief/Interim Summary: HPI was taken from Dr. Francine Graven: [name] is a 87 y.o. male with medical history significant for chronic systolic CHF with EF of 0000000, hypertension, asthma, GERD, gout, anxiety, AICD placement, left bundle blockage, porphyruria, IBS, GI bleeding, diverticulitis, BPH, atrial fibrillation on Coumadin, anemia, aortic valve regurgitation, esophageal stricture, pulmonary hypertension, thrombocytopenia, CKD-3A, sinus infection, who was recently discharged from the hospital after hospitalization for acute exacerbation of his known CHF.  He presents to the emergency room for evaluation of nausea and weakness. Patient's family who were at the bedside state that since his discharge he has had nausea and has been having to clear his throat frequently.  His oral intake has been poor and he has also complained of having chills.  Over the last 24 hours patient has had multiple loose stools which he attributes to taking too many laxatives.  He also complains of worsening shortness of breath from his baseline and had to use his sister's oxygen over the weekend because he could not breathe. He has bilateral lower extremity swelling and states that he had chills at home. He denies having any chest pain, no abdominal pain, no dizziness, no lightheadedness, no hematemesis, no hematochezia, no headache, no blurred vision, no diaphoresis or  palpitations. Labs show sodium 129, potassium 5.5, chloride 88, bicarb 25, glucose 114, BUN 51, creatinine 2.22, calcium 9.4, alkaline phosphatase 107, albumin 3.9, AST 432, ALT 361, total protein 6.4, total bilirubin 2.7, BNP 2212, lactic acid 5.4, white count 10.2, hemoglobin 14.1, hematocrit 42.5, MCV 88.7, RDW 18.3, platelet count 142, PT 27, INR 2.5 Chest x-ray reviewed by me shows chronic cardiomegaly.  Small pleural effusions, stable.  No new cardiopulmonary abnormality. Liver ultrasound shows small amount of right upper quadrant ascites.  Mildly nodular liver findings are suggestive of cirrhosis. Twelve-lead EKG shows paced rhythm   ED Course: Patient is an 87 year old Caucasian male who was recently discharged from the hospital and who presents to the emergency room for evaluation of nausea, weakness and diarrhea since his hospital discharge. Patient is noted to have multiple lab abnormalities which include slight worsening of his renal function from his baseline on discharge 1.7 >> 2.22, hyponatremia as well as lactic acidosis with lactate level of 5.4. He will be referred to observation for further evaluation.  Hospital course from Dr. Jimmye Norman 9/7-12/16/20: Pt presented w/ dehydration likely secondary to diuresis as well as from diarrhea. The diarrhea resolved prior to d/c. Pt diuretics were initially held but torsemide was restarted at a reduced dose and will be continued on different sliding scale based off of the pt's weight. Of note, pt had [place] abd which showed findings suggestive of cirrhosis. MELD score of 27. Liver functions were significantly elevated but trending down on the day of d/c. GI was consulted and the transaminitis was likely secondary to ischemic hepatopathy and they recommended supportive care and avoiding hypotension as much as possible. For more information, please  see previous progress/consult notes.   Discharge Diagnoses:  Principal Problem:   Dehydration with  hyponatremia Active Problems:   Anxiety   NICM (nonischemic cardiomyopathy) (HCC)   Chronic systolic heart failure (HCC)   Chronic constipation   Chronic atrial fibrillation (HCC)   HTN (hypertension)   CKD (chronic kidney disease), stage IIIa   Hyponatremia   Hyperkalemia   Generalized weakness   Lactic acidosis   Transaminitis  Dehydration: likely secondary to diuresis as well as GI losses from diarrhea. Cr continues to trend down daily. Resolved    Lactic acidosis: secondary to diarrhea with no evidence of sepsis at this time  Chronic systolic CHF: echo from [id number] shows an LVEF of 20 to [id number], diastolic function is indeterminate. Continue on torsemide. Continue to hold metolazone, carvedilol    Acute hypoxic respiratory failure: likely secondary CHF.Resolved    Hypotension: secondary to overdiuresis as well as GI losses from diarrhea. Continue to hold carvedilol    Permanent a. fib: rate controlled. Continue on warfarin    Hyperkalemia: resolved   Hypokalemia: WNL today   AKI on CKDIIIa: Cr is trending down from day prior    Transaminitis: etiology unclear, likely secondary to cirrhosis. Still elevated but trending down from day prior. Avoid tylenol. Abd US shows findings suggestive of cirrhosis. No alcohol use, possibly secondary to NASH.  MELD score of 27. GI consulted    Normocytic anemia: H&H are labile. No indication for transfusion currently    Gout: continue on chronic dose of steroids    Thrombocytopenia: etiology unclear, likely secondary to cirrhosis. Will continue to monitor   Discharge Instructions  Discharge Instructions     Diet - low sodium heart healthy   Complete by: As directed    Discharge instructions   Complete by: As directed    F/u w/ cardio, Dr. [name], in 1 week.  Weight 130 or less no torsemide, weight 130 to 132 torsemide 20, weight 133-137 torsemide 40, weight 137-140 torsemide 40 twice daily, over 140 take metolazone in the morning with  torsemide 40 twice a day as per cardio. F/u w/ GI, Dr. Haig Prophet, in 1-2 weeks. F/u w/ PCP in 1-2 weeks.   Increase activity slowly   Complete by: As directed       Allergies as of 12/16/2020       Reactions   Carafate [sucralfate] Other (See Comments)   Burns stomach   Clarithromycin Nausea Only   Famotidine Other (See Comments)   ABD. PAIN   Levaquin [levofloxacin] Other (See Comments)   Stomach pain, has to eat a lot of food   Omeprazole Diarrhea   Oxytetracycline Other (See Comments)   BUMPS   Penicillins Swelling   Amoxicillin ok, Pt states he had to have a shot to reverse the reaction Has patient had a PCN reaction causing immediate rash, facial/tongue/throat swelling, SOB or lightheadedness with hypotension: Yes Has patient had a PCN reaction causing severe rash involving mucus membranes or skin necrosis: No Has patient had a PCN reaction that required hospitalization No Has patient had a PCN reaction occurring within the last 10 years: Yes If all of the above answers are "NO", then may proceed with   Proton Pump Inhibitors Other (See Comments)   GI upset, diarrhea, gas, bloating   Ranitidine Diarrhea   Doxycycline Rash        Medication List     TAKE these medications    acetaminophen 500 MG tablet Commonly known as: TYLENOL Take 250 mg  by mouth daily as needed. Pain   allopurinol 100 MG tablet Commonly known as: ZYLOPRIM Take 100 mg by mouth daily. Per patient taking 2 tablets (200 mg)   ALPRAZolam 0.25 MG tablet Commonly known as: XANAX TAKE 1/2 TO 1 TABLET (0.125-0.25 MG TOTAL) BY MOUTH AT BEDTIME AS NEEDED FOR ANXIETY OR SLEEP. What changed: See the new instructions.   alum & mag hydroxide-simeth 200-200-20 MG/5ML suspension Commonly known as: MAALOX/MYLANTA Take 15 mLs by mouth daily as needed for indigestion or heartburn.   Azelastine HCl 137 MCG/SPRAY Soln Place 1 spray into both nostrils 2 (two) times daily.   carvedilol 3.125 MG  tablet Commonly known as: COREG Take 1 tablet (3.125 mg total) by mouth 2 (two) times daily with a meal.   fluticasone 50 MCG/ACT nasal spray Commonly known as: FLONASE Place 1 spray into both nostrils daily.   metolazone 2.5 MG tablet Commonly known as: ZAROXOLYN metolazone 2.'5mg'$  daily for weight < 140lbs metolazone 5 mg for weight 140lbs or >   montelukast 10 MG tablet Commonly known as: SINGULAIR Take 10 mg by mouth at bedtime.   polyethylene glycol 17 g packet Commonly known as: MIRALAX / GLYCOLAX Take 17 g by mouth daily as needed (constipation).   potassium chloride SA 20 MEQ tablet Commonly known as: KLOR-CON Take 2 tablets (40 mEq total) by mouth 2 (two) times daily.   predniSONE 5 MG tablet Commonly known as: DELTASONE Take 2 tablets (10 mg total) by mouth daily with breakfast.   tamsulosin 0.4 MG Caps capsule Commonly known as: FLOMAX Take 2 capsules (0.8 mg total) by mouth daily after supper.   torsemide 20 MG tablet Commonly known as: DEMADEX Weight 130 or less no torsemide, weight 130 to  132 torsemide 20, weight 133-137 torsemide 40, weight 137-140 torsemide 40 twice daily, over 140 take metolazone in the morning with torsemide 40 twice a day. What changed:  medication strength additional instructions   vitamin B-12 500 MCG tablet Commonly known as: CYANOCOBALAMIN Take 2 tablets (1,000 mcg total) by mouth daily.   warfarin 5 MG tablet Commonly known as: COUMADIN Take as directed. If you are unsure how to take this medication, talk to your nurse or doctor. Original instructions: Take 2.5 mg by mouth as directed. 5 days weekly. All days except Monday and Friday What changed: Another medication with the same name was changed. Make sure you understand how and when to take each.   warfarin 5 MG tablet Commonly known as: COUMADIN Take as directed. If you are unsure how to take this medication, talk to your nurse or doctor. Original instructions: TAKE 1/2  TO 1 TABLET BY MOUTH DAILY AS DIRECTED BY COUMADIN CLINIC What changed: See the new instructions.        Follow-up Information     Lesly Rubenstein, MD Follow up in 2 week(s).   Specialty: Gastroenterology Contact information: [place] [address] [phone]         [name], MD Follow up in 1 week(s).   Specialty: Cardiology Contact information: [id number] [place] [address] [phone]                Allergies  Allergen Reactions   Carafate [Sucralfate] Other (See Comments)    Burns stomach   Clarithromycin Nausea Only   Famotidine Other (See Comments)    ABD. PAIN   Levaquin [Levofloxacin] Other (See Comments)    Stomach pain, has to eat a  lot of food   Omeprazole Diarrhea   Oxytetracycline Other (See Comments)    BUMPS   Penicillins Swelling    Amoxicillin ok, Pt states he had to have a shot to reverse the reaction Has patient had a PCN reaction causing immediate rash, facial/tongue/throat swelling, SOB or lightheadedness with hypotension: Yes Has patient had a PCN reaction causing severe rash involving mucus membranes or skin necrosis: No Has patient had a PCN reaction that required hospitalization No Has patient had a PCN reaction occurring within the last 10 years: Yes If all of the above answers are "NO", then may proceed with   Proton Pump Inhibitors Other (See Comments)    GI upset, diarrhea, gas, bloating   Ranitidine Diarrhea   Doxycycline Rash    Consultations: Cardio GI   Procedures/Studies: DG Chest 2 View  Result Date: 12/04/2020 CLINICAL DATA:  Shortness of breath. EXAM: CHEST - 2 VIEW COMPARISON:  June 17, 2020 FINDINGS: There is a multi lead AICD. Stable, diffuse, chronic appearing increased interstitial lung markings are seen. Mild areas of atelectasis are noted within the bilateral lung bases. Small bilateral pleural effusions are seen. There is no evidence of a  pneumothorax. There is moderate to marked severity cardiac silhouette enlargement. A radiopaque right shoulder replacement is present. IMPRESSION: 1. Stable cardiomegaly with mild bibasilar atelectasis. 2. Small bilateral pleural effusions. Electronically Signed   By: Virgina Norfolk M.D.   On: 12/04/2020 20:26   DG Chest Portable 1 View  Result Date: 12/13/2020 CLINICAL DATA:  87 year old male with nausea and weakness. EXAM: PORTABLE CHEST 1 VIEW COMPARISON:  Chest radiographs 12/04/2020 and earlier. FINDINGS: Portable AP upright view at 0737 hours. Stable cardiomegaly and mediastinal contours. Visualized tracheal air column is within normal limits. Stable left chest AICD. A small right pleural effusion appears increased since March, stable since last month. No pneumothorax or pulmonary edema. No consolidation or areas of worsening ventilation. Previous right shoulder arthroplasty. No acute osseous abnormality identified. IMPRESSION: 1. Chronic cardiomegaly. Small pleural effusion(s) stable from last month. 2. No new cardiopulmonary abnormality. Electronically Signed   By: Genevie Ann M.D.   On: 12/13/2020 07:59   ECHOCARDIOGRAM COMPLETE  Result Date: 12/06/2020    ECHOCARDIOGRAM REPORT   Patient Name:   CARL ZALES Date of Exam: 12/06/2020 Medical Rec #:  [id number]             Height:       64.0 in Accession #:    [id number]            Weight:       140.6 lb Date of Birth:  08-09-1931             BSA:          1.684 m Patient Age:    45 years              BP:           105/64 mmHg Patient Gender: M                     HR:           70 bpm. Exam Location:  [facility] Procedure: 2D Echo, Cardiac Doppler, Color Doppler and Strain Analysis Indications:     CHF-acute systolic [id number]  History:         Patient has prior history of Echocardiogram examinations, most  recent 06/18/2020. AICD, ischemic cardiomyopathy.  Sonographer:     Sherrie Sport Referring Phys:  [id number] Ivor Costa Diagnosing Phys:  Nelva Bush MD  Sonographer Comments: Global longitudinal strain was attempted. IMPRESSIONS  1. Left ventricular ejection fraction, by estimation, is 20 to 25%. The left ventricle has severely decreased function. The left ventricle demonstrates global hypokinesis. Left ventricular diastolic parameters are indeterminate. The average left ventricular global longitudinal strain is -9.0 %. The global longitudinal strain is abnormal.  2. Right ventricular systolic function is moderately reduced. The right ventricular size is normal. There is mildly elevated pulmonary artery systolic pressure.  3. Left atrial size was severely dilated.  4. Right atrial size was moderately dilated.  5. The mitral valve is abnormal. Mild mitral valve regurgitation.  6. The aortic valve was not well visualized. Aortic valve regurgitation is mild. No aortic stenosis is present.  7. The inferior vena cava is normal in size with <50% respiratory variability, suggesting right atrial pressure of 8 mmHg. FINDINGS  Left Ventricle: Left ventricular size and wall thickness not well-assessed. Left ventricular ejection fraction, by estimation, is 20 to 25%. The left ventricle has severely decreased function. The left ventricle demonstrates global hypokinesis. The average left ventricular global longitudinal strain is -9.0 %. The global longitudinal strain is abnormal. Left ventricular diastolic parameters are indeterminate. Right Ventricle: The right ventricular size is normal. Right vetricular wall thickness was not well visualized. Right ventricular systolic function is moderately reduced. There is mildly elevated pulmonary artery systolic pressure. The tricuspid regurgitant velocity is 3.03 m/s, and with an assumed right atrial pressure of 8 mmHg, the estimated right ventricular systolic pressure is 123456 mmHg. Left Atrium: Left atrial size was severely dilated. Right Atrium: Right atrial size was moderately dilated. Pericardium: There is no  evidence of pericardial effusion. Mitral Valve: The mitral valve is abnormal. There is mild thickening of the mitral valve leaflet(s). Mild mitral valve regurgitation. Tricuspid Valve: The tricuspid valve is not well visualized. Tricuspid valve regurgitation is trivial. Aortic Valve: The aortic valve was not well visualized. Aortic valve regurgitation is mild. No aortic stenosis is present. Aortic valve mean gradient measures 2.0 mmHg. Aortic valve peak gradient measures 3.1 mmHg. Aortic valve area, by VTI measures 3.06  cm. Pulmonic Valve: The pulmonic valve was not well visualized. Aorta: The aortic root was not well visualized. Venous: The inferior vena cava is normal in size with less than 50% respiratory variability, suggesting right atrial pressure of 8 mmHg. IAS/Shunts: The interatrial septum was not well visualized. Additional Comments: A device lead is visualized in the right atrium and right ventricle.  LEFT VENTRICLE PLAX 2D LVIDd:         5.05 cm LVIDs:         4.56 cm      2D Longitudinal Strain LV PW:         0.96 cm      2D Strain GLS Avg:     -9.0 % LV IVS:        0.72 cm LVOT diam:     2.00 cm LV SV:         48 LV SV Index:   28           3D Volume EF: LVOT Area:     3.14 cm     3D EF:        42 %  LV EDV:       166 ml                             LV ESV:       96 ml LV Volumes (MOD)            LV SV:        70 ml LV vol d, MOD A2C: 133.0 ml LV vol d, MOD A4C: 110.0 ml LV vol s, MOD A2C: 102.0 ml LV vol s, MOD A4C: 72.7 ml LV SV MOD A2C:     31.0 ml LV SV MOD A4C:     110.0 ml LV SV MOD BP:      37.0 ml RIGHT VENTRICLE RV Basal diam:  3.25 cm RV S prime:     7.07 cm/s TAPSE (M-mode): 1.2 cm LEFT ATRIUM              Index       RIGHT ATRIUM           Index LA diam:        4.90 cm  2.91 cm/m  RA Area:     28.50 cm LA Vol (A2C):   96.9 ml  57.53 ml/m RA Volume:   91.40 ml  54.27 ml/m LA Vol (A4C):   113.0 ml 67.09 ml/m LA Biplane Vol: 108.0 ml 64.12 ml/m  AORTIC VALVE  AV Area (Vmax):    2.70 cm AV Area (Vmean):   2.62 cm AV Area (VTI):     3.06 cm AV Vmax:           87.50 cm/s AV Vmean:          62.400 cm/s AV VTI:            0.156 m AV Peak Grad:      3.1 mmHg AV Mean Grad:      2.0 mmHg LVOT Vmax:         75.10 cm/s LVOT Vmean:        52.000 cm/s LVOT VTI:          0.152 m LVOT/AV VTI ratio: 0.97 MITRAL VALVE               TRICUSPID VALVE MV Area (PHT): 4.49 cm    TR Peak grad:   36.7 mmHg MV Decel Time: 169 msec    TR Vmax:        303.00 cm/s MV E velocity: 85.30 cm/s                            SHUNTS                            Systemic VTI:  0.15 m                            Systemic Diam: 2.00 cm Nelva Bush MD Electronically signed by Nelva Bush MD Signature Date/Time: 12/06/2020/11:45:03 AM    Final    [place] MESENTERIC ARTERIES  Result Date: 11/28/2020 CLINICAL DATA:  Abdominal pain, chronic, right upper quadrant EXAM: [place] MESENTERIC ARTERIAL DOPPLER COMPARISON:  CT 06/14/2020 and previous FINDINGS: Celiac axis: 72.9 cm/sec proximally, 40 cm/seconds distal Celiac axis with inspiration: 74.2 cm/sec Celiac axis with expiration: 123.7 cm/sec Splenic artery: 33 cm/sec Hepatic artery: 77 cm/sec  SMA: 74-129 cm/sec IMA: Not visualized Aorta: 72.7 cm/sec Aortic size: 2.5 cm proximally, 1.8 cm in the mid segment and 1.5 cm distally IMPRESSION: 1. No Doppler evidence of hemodynamically significant mesenteric arterial occlusive disease. 2. Inferior mesenteric artery was not visualized. Electronically Signed   By: Lucrezia Europe M.D.   On: 11/28/2020 12:20   US Abdomen Limited RUQ (LIVER/GB)  Result Date: 12/13/2020 CLINICAL DATA:  Elevated LFTs. EXAM: ULTRASOUND ABDOMEN LIMITED RIGHT UPPER QUADRANT COMPARISON:  None. FINDINGS: Gallbladder: Surgically absent. Common bile duct: Diameter: 3 mm Liver: No focal lesion identified. Within normal limits in parenchymal echogenicity. Mildly nodular contour. Portal vein is patent on color Doppler imaging with normal direction of  blood flow towards the liver. Other: Small amount of right upper quadrant ascites IMPRESSION: Small amount of right upper quadrant ascites. Mildly nodular liver contour, findings are suggestive of cirrhosis. Electronically Signed   By: Yetta Glassman M.D.   On: 12/13/2020 09:38   (Echo, Carotid, EGD, Colonoscopy, ERCP)    Subjective: Pt denies any complaints    Discharge Exam: Vitals:   12/16/20 0412 12/16/20 0953  BP: 97/69 102/74  Pulse: (!) 55 (!) 56  Resp: 19   Temp: 98.3 F (36.8 C) 98.6 F (37 C)  SpO2: 96% 94%   Vitals:   12/15/20 1933 12/15/20 2353 12/16/20 0412 12/16/20 0953  BP: 97/60 (!) 94/59 97/69 102/74  Pulse: (!) 58 87 (!) 55 (!) 56  Resp: '18 18 19   '$ Temp: 98.3 F (36.8 C) 98.5 F (36.9 C) 98.3 F (36.8 C) 98.6 F (37 C)  TempSrc:    Oral  SpO2: 96% 93% 96% 94%  Weight:      Height:        General: Pt is alert, awake, not in acute distress Cardiovascular: S1/S2 +, no rubs, no gallops Respiratory: CTA bilaterally, no wheezing, no rhonchi Abdominal: Soft, NT, ND, bowel sounds + Extremities: no cyanosis    The results of significant diagnostics from this hospitalization (including imaging, microbiology, ancillary and laboratory) are listed below for reference.     Microbiology: Recent Results (from the past 240 hour(s))  Gastrointestinal Panel by PCR , Stool     Status: None   Collection Time: 12/13/20 10:22 AM   Specimen: Stool  Result Value Ref Range Status   Campylobacter species NOT DETECTED NOT DETECTED Final   Plesimonas shigelloides NOT DETECTED NOT DETECTED Final   Salmonella species NOT DETECTED NOT DETECTED Final   Yersinia enterocolitica NOT DETECTED NOT DETECTED Final   Vibrio species NOT DETECTED NOT DETECTED Final   Vibrio cholerae NOT DETECTED NOT DETECTED Final   Enteroaggregative E coli (EAEC) NOT DETECTED NOT DETECTED Final   Enteropathogenic E coli (EPEC) NOT DETECTED NOT DETECTED Final   Enterotoxigenic E coli (ETEC) NOT  DETECTED NOT DETECTED Final   Shiga like toxin producing E coli (STEC) NOT DETECTED NOT DETECTED Final   Shigella/Enteroinvasive E coli (EIEC) NOT DETECTED NOT DETECTED Final   Cryptosporidium NOT DETECTED NOT DETECTED Final   Cyclospora cayetanensis NOT DETECTED NOT DETECTED Final   Entamoeba histolytica NOT DETECTED NOT DETECTED Final   Giardia lamblia NOT DETECTED NOT DETECTED Final   Adenovirus F40/41 NOT DETECTED NOT DETECTED Final   Astrovirus NOT DETECTED NOT DETECTED Final   Norovirus GI/GII NOT DETECTED NOT DETECTED Final   Rotavirus A NOT DETECTED NOT DETECTED Final   Sapovirus (I, II, IV, and V) NOT DETECTED NOT DETECTED Final    Comment: Performed at [facility]  [facility], [place], [place] [address]  C Difficile Quick Screen w PCR reflex     Status: None   Collection Time: 12/13/20 10:22 AM   Specimen: Stool  Result Value Ref Range Status   C Diff antigen NEGATIVE NEGATIVE Final   C Diff toxin NEGATIVE NEGATIVE Final   C Diff interpretation No C. difficile detected.  Final    Comment: Performed at [facility] [place], [place] [address]     Labs: BNP (last 3 results) Recent Labs    06/19/20 0600 12/04/20 1956 12/13/20 0802  BNP 2,034.7* 2,501.7* [id number]*   Basic Metabolic Panel: Recent Labs  Lab 12/10/20 0439 12/13/20 0802 12/14/20 0442 12/15/20 0630 12/16/20 0625  NA 136 129* 132* 131* 132*  K 3.9 5.5* 3.7 3.2* 3.8  CL 97* 88* 98 94* 92*  CO2 32 '25 26 28 28  '$ GLUCOSE 94 114* 80 92 89  BUN 40* 51* 48* 39* 36*  CREATININE 1.71* 2.22* 2.00* 1.63* 1.44*  CALCIUM 8.8* 9.4 8.0* 8.0* 8.4*  MG  --   --   --  1.8  --    Liver Function Tests: Recent Labs  Lab 12/13/20 0802 12/14/20 0442 12/15/20 0630 12/16/20 0625  AST 432* 768* 1,291* 740*  ALT 361* 620* 1,229* 1,069*  ALKPHOS 107 79 74 79  BILITOT 2.7* 1.7* 1.6* 1.5*  PROT 6.4* 4.7* 5.0* 5.4*  ALBUMIN 3.9 2.9* 2.8* 3.1*   No results for input(s): LIPASE,  AMYLASE in the last 168 hours. No results for input(s): AMMONIA in the last 168 hours. CBC: Recent Labs  Lab 12/10/20 0439 12/13/20 0802 12/14/20 0442 12/15/20 0630 12/16/20 0625  WBC 6.9 10.5 6.1 5.1 5.0  NEUTROABS  --  8.2*  --   --   --   HGB 12.1* 14.1 11.9* 11.8* 12.2*  HCT 35.3* 42.5 33.8* 34.6* 35.4*  MCV 87.8 88.7 86.9 87.2 87.0  PLT 112* 142* 94* 90* 79*   Cardiac Enzymes: No results for input(s): CKTOTAL, CKMB, CKMBINDEX, TROPONINI in the last 168 hours. BNP: Invalid input(s): POCBNP CBG: No results for input(s): GLUCAP in the last 168 hours. D-Dimer No results for input(s): DDIMER in the last 72 hours. Hgb A1c No results for input(s): HGBA1C in the last 72 hours. Lipid Profile No results for input(s): CHOL, HDL, LDLCALC, TRIG, CHOLHDL, LDLDIRECT in the last 72 hours. Thyroid function studies No results for input(s): TSH, T4TOTAL, T3FREE, THYROIDAB in the last 72 hours.  Invalid input(s): FREET3 Anemia work up No results for input(s): VITAMINB12, FOLATE, FERRITIN, TIBC, IRON, RETICCTPCT in the last 72 hours. Urinalysis    Component Value Date/Time   COLORURINE YELLOW 12/14/2020 0533   APPEARANCEUR CLEAR 12/14/2020 0533   APPEARANCEUR Clear 07/06/2019 1046   LABSPEC 1.015 12/14/2020 0533   LABSPEC 1.010 08/20/2013 1220   PHURINE 6.0 12/14/2020 0533   GLUCOSEU NEGATIVE 12/14/2020 0533   GLUCOSEU Negative 08/20/2013 1220   HGBUR SMALL (A) 12/14/2020 0533   BILIRUBINUR NEGATIVE 12/14/2020 0533   BILIRUBINUR Negative 07/06/2019 1046   BILIRUBINUR Negative 08/20/2013 1220   KETONESUR NEGATIVE 12/14/2020 0533   PROTEINUR NEGATIVE 12/14/2020 0533   UROBILINOGEN 0.2 08/17/2013 1019   NITRITE NEGATIVE 12/14/2020 0533   LEUKOCYTESUR NEGATIVE 12/14/2020 0533   LEUKOCYTESUR Negative 08/20/2013 1220   Sepsis Labs Invalid input(s): PROCALCITONIN,  WBC,  LACTICIDVEN Microbiology Recent Results (from the past 240 hour(s))  Gastrointestinal Panel by PCR , Stool      Status: None  Collection Time: 12/13/20 10:22 AM   Specimen: Stool  Result Value Ref Range Status   Campylobacter species NOT DETECTED NOT DETECTED Final   Plesimonas shigelloides NOT DETECTED NOT DETECTED Final   Salmonella species NOT DETECTED NOT DETECTED Final   Yersinia enterocolitica NOT DETECTED NOT DETECTED Final   Vibrio species NOT DETECTED NOT DETECTED Final   Vibrio cholerae NOT DETECTED NOT DETECTED Final   Enteroaggregative E coli (EAEC) NOT DETECTED NOT DETECTED Final   Enteropathogenic E coli (EPEC) NOT DETECTED NOT DETECTED Final   Enterotoxigenic E coli (ETEC) NOT DETECTED NOT DETECTED Final   Shiga like toxin producing E coli (STEC) NOT DETECTED NOT DETECTED Final   Shigella/Enteroinvasive E coli (EIEC) NOT DETECTED NOT DETECTED Final   Cryptosporidium NOT DETECTED NOT DETECTED Final   Cyclospora cayetanensis NOT DETECTED NOT DETECTED Final   Entamoeba histolytica NOT DETECTED NOT DETECTED Final   Giardia lamblia NOT DETECTED NOT DETECTED Final   Adenovirus F40/41 NOT DETECTED NOT DETECTED Final   Astrovirus NOT DETECTED NOT DETECTED Final   Norovirus GI/GII NOT DETECTED NOT DETECTED Final   Rotavirus A NOT DETECTED NOT DETECTED Final   Sapovirus (I, II, IV, and V) NOT DETECTED NOT DETECTED Final    Comment: Performed at [facility], [address] [place], [place] [address]  C Difficile Quick Screen w PCR reflex     Status: None   Collection Time: 12/13/20 10:22 AM   Specimen: Stool  Result Value Ref Range Status   C Diff antigen NEGATIVE NEGATIVE Final   C Diff toxin NEGATIVE NEGATIVE Final   C Diff interpretation No C. difficile detected.  Final    Comment: Performed at [facility] [address] [place], [place] [address]     Time coordinating discharge: Over 30 minutes  SIGNED:   Wyvonnia Dusky, MD  [facility] 12/16/2020, 12:47 PM Pager   If 7PM-7AM, please contact night-coverage

## 2020-12-16 NOTE — Progress Notes (Signed)
Physical Therapy Treatment Patient Details Name: [name] MRN: [id number] DOB: Apr 16, 1931 Today's Date: 12/16/2020    History of Present Illness 87 y.o. male with medical history significant for chronic systolic CHF with EF of 0000000, hypertension, asthma, GERD, gout, anxiety, AICD placement, left bundle blockage, porphyruria, IBS, GI bleeding, diverticulitis, BPH, atrial fibrillation on Coumadin, anemia, aortic valve regurgitation, esophageal stricture, pulmonary hypertension, thrombocytopenia, CKD-3A, sinus infection, who was recently discharged from the hospital after hospitalization for acute exacerbation of his known CHF.  He presents to the emergency room for evaluation of nausea and weakness.    PT Comments    Pt was long sitting in bed upon arriving. He agrees to PT session and is cooperative  throughout. Supportive daughter at bedside and reports they feel good about Dcing home without follow up therapy. Pt was able to exit L side of bed, stand, and ambulate without much assistance. Lengthy education + recommendation for use of RW until strength fully returns. Pt states understanding and reports he will use his personal RW at DC.    Follow Up Recommendations  Supervision/Assistance - 24 hour;Supervision for mobility/OOB;Other (comment) (pt currently refusing any follow up PT. Family is ok with DC home without continued therapy)     Equipment Recommendations  None recommended by PT       Precautions / Restrictions Precautions Precautions: Fall    Mobility  Bed Mobility Overal bed mobility: Needs Assistance Bed Mobility: Supine to Sit;Sit to Supine     Supine to sit: HOB elevated;Supervision Sit to supine: HOB elevated;Supervision        Transfers Overall transfer level: Needs assistance Equipment used: Straight cane Transfers: Sit to/from Stand Sit to Stand: Supervision         General transfer comment: close supervision for safety. recommend use of RW  at DC  Ambulation/Gait Ambulation/Gait assistance: Min guard Gait Distance (Feet): 200 Feet Assistive device: Straight cane Gait Pattern/deviations: WFL(Within Functional Limits);Step-through pattern;Decreased stride length;Trunk flexed;Narrow base of support Gait velocity: decreased   General Gait Details: pt was easily able to ambulate 200 ft with [facility] however required constant CGA for safety. recommend use of RW at DC until pt regains full strength      Balance Overall balance assessment: Needs assistance Sitting-balance support: Feet supported;No upper extremity supported Sitting balance-Leahy Scale: Good     Standing balance support: During functional activity;Single extremity supported Standing balance-Leahy Scale: Fair         Cognition Arousal/Alertness: Awake/alert Behavior During Therapy: WFL for tasks assessed/performed Overall Cognitive Status: Within Functional Limits for tasks assessed      General Comments: A & O x4             Pertinent Vitals/Pain Pain Assessment: No/denies pain           PT Goals (current goals can now be found in the care plan section) Acute Rehab PT Goals Patient Stated Goal: to go home Progress towards PT goals: Progressing toward goals    Frequency    Min 2X/week      PT Plan Current plan remains appropriate       AM-PAC PT "6 Clicks" Mobility   Outcome Measure  Help needed turning from your back to your side while in a flat bed without using bedrails?: None Help needed moving from lying on your back to sitting on the side of a flat bed without using bedrails?: None Help needed moving to and from a bed to a chair (including a wheelchair)?: A  Little Help needed standing up from a chair using your arms (e.g., wheelchair or bedside chair)?: A Little Help needed to walk in hospital room?: A Little Help needed climbing 3-5 steps with a railing? : A Little 6 Click Score: 20    End of Session Equipment Utilized  During Treatment: Gait belt Activity Tolerance: Patient tolerated treatment well Patient left: in bed;with call bell/phone within reach;with bed alarm set;with family/visitor present Nurse Communication: Mobility status PT Visit Diagnosis: Unsteadiness on feet (R26.81);Other abnormalities of gait and mobility (R26.89);Muscle weakness (generalized) (M62.81)     Time: [id number] PT Time Calculation (min) (ACUTE ONLY): 25 min  Charges:  $Gait Training: 8-22 mins $Therapeutic Activity: 8-22 mins                     Julaine Fusi PTA 12/16/20, 9:56 AM

## 2020-12-16 NOTE — Progress Notes (Addendum)
[place] for warfarin Indication: atrial fibrillation  Allergies  Allergen Reactions   Carafate [Sucralfate] Other (See Comments)    Burns stomach   Clarithromycin Nausea Only   Famotidine Other (See Comments)    ABD. PAIN   Levaquin [Levofloxacin] Other (See Comments)    Stomach pain, has to eat a lot of food   Omeprazole Diarrhea   Oxytetracycline Other (See Comments)    BUMPS   Penicillins Swelling    Amoxicillin ok, Pt states he had to have a shot to reverse the reaction Has patient had a PCN reaction causing immediate rash, facial/tongue/throat swelling, SOB or lightheadedness with hypotension: Yes Has patient had a PCN reaction causing severe rash involving mucus membranes or skin necrosis: No Has patient had a PCN reaction that required hospitalization No Has patient had a PCN reaction occurring within the last 10 years: Yes If all of the above answers are "NO", then may proceed with   Proton Pump Inhibitors Other (See Comments)    GI upset, diarrhea, gas, bloating   Ranitidine Diarrhea   Doxycycline Rash    Patient Measurements: Height: '5\' 4"'$  (162.6 cm) Weight: 56.4 kg (124 lb 6.4 oz) IBW/kg (Calculated) : 59.2   Vital Signs: Temp: 98.3 F (36.8 C) (09/09 0412) BP: 97/69 (09/09 0412) Pulse Rate: 55 (09/09 0412)  Labs: Recent Labs    12/13/20 0953 12/14/20 0442 12/14/20 0442 12/15/20 0630 12/16/20 0625  HGB  --  11.9*   < > 11.8* 12.2*  HCT  --  33.8*  --  34.6* 35.4*  PLT  --  94*  --  90* 79*  LABPROT  --  27.4*  --  27.7* 22.6*  INR  --  2.6*  --  2.6* 2.0*  CREATININE  --  2.00*  --  1.63* 1.44*  TROPONINIHS 11  --   --   --   --    < > = values in this interval not displayed.    Estimated Creatinine Clearance: 28.8 mL/min (A) (by C-G formula based on SCr of 1.44 mg/dL (H)).   Medical History: Past Medical History:  Diagnosis Date   AICD (automatic cardioverter/defibrillator) present    s/p gen change  04/2015 w/ MDT [name] Crt-D  DTBA1D1, Serial number [id number]   Allergic rhinitis    [name]   Anemia    Anxiety    Arthritis    Atrial flutter ([place])    A.  Status post cardioversion; B.  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 Lucky Cowboy)   Chronic lung disease    spirometry 2015 - no obstruction, + mild restrictive lung disease   Chronic sinusitis    Chronic systolic congestive heart failure ([place])    Dyspnea    with exertion   Extrinsic asthma    [name]   Fall with injury 05/28/2017   GERD (gastroesophageal reflux disease) 2010   h/o esophageal stricture with dilation, LA grade C reflux esophagitis by EGD 2010   Goiter    History of diverticulitis of colon 06/2020   History of GI bleed    Secondary to hemorrhoids   History of seasonal allergies    Hypertension    IBS (irritable bowel syndrome)    LBBB (left bundle branch block)    Multiple pulmonary nodules  06/2013   RUL ([facility]) - ?vasculitis as of last CT at [facility]   NICM (nonischemic cardiomyopathy) [facility]    Cardiac catheterization March 2006 without coronary disease; EF 25% 2018 Jan   Orthopnea    Porphyria [facility])    Presence of permanent cardiac pacemaker    Sinus congestion 09/25/2010    Medications:  Medications Prior to Admission  Medication Sig Dispense Refill Last Dose   allopurinol (ZYLOPRIM) 100 MG tablet Take 100 mg by mouth daily. Per patient taking 2 tablets (200 mg)   12/12/2020 at 0800   ALPRAZolam (XANAX) 0.25 MG tablet TAKE 1/2 TO 1 TABLET (0.125-0.25 MG TOTAL) BY MOUTH AT BEDTIME AS NEEDED FOR ANXIETY OR SLEEP. (Patient taking differently: 0.125 mg 2 (two) times daily as needed for anxiety or sleep.) 30 tablet 1 12/12/2020 at 2000   Azelastine HCl 137 MCG/SPRAY SOLN Place 1 spray into both nostrils 2 (two) times daily.    12/12/2020 at 2000   carvedilol (COREG) 3.125 MG tablet Take 1 tablet (3.125 mg total) by mouth 2 (two) times daily with a meal. 60 tablet 1 12/12/2020 at 2000   fluticasone (FLONASE) 50 MCG/ACT nasal spray Place 1 spray into both nostrils daily.   12/12/2020 at 1500   montelukast (SINGULAIR) 10 MG tablet Take 10 mg by mouth at bedtime.   12/12/2020 at 1800   predniSONE (DELTASONE) 5 MG tablet Take 2 tablets (10 mg total) by mouth daily with breakfast.   12/12/2020 at 0800   tamsulosin (FLOMAX) 0.4 MG CAPS capsule Take 2 capsules (0.8 mg total) by mouth daily after supper. 90 capsule 7 12/12/2020 at 1800   torsemide 40 MG TABS torsemide 40 twice daily for weight 132 - 135lbs torsemide 60 twice daily for weight 136 - 139lbs torsemide 60 twice daily along with metolazone '5mg'$  daily for weight 140lbs or > 90 tablet 1 12/12/2020 at 0800   vitamin B-12 (CYANOCOBALAMIN) 500 MCG tablet Take 2 tablets (1,000 mcg total) by mouth daily.   12/12/2020 at 0800   warfarin (COUMADIN) 5 MG tablet TAKE 1/2 TO 1 TABLET BY MOUTH DAILY AS DIRECTED BY COUMADIN CLINIC (Patient taking differently: Take 5 mg by mouth 2 (two) times a week. Mondays and Fridays) 90 tablet 2 12/12/2020 at 1800   acetaminophen (TYLENOL) 500 MG tablet Take 250 mg by mouth daily as needed. Pain    unknown   alum & mag hydroxide-simeth (MAALOX/MYLANTA) 200-200-20 MG/5ML suspension Take 15 mLs by mouth daily as needed for indigestion or heartburn.   unknown at prn   metolazone (ZAROXOLYN) 2.5 MG tablet metolazone 2.'5mg'$  daily for weight < 140lbs metolazone 5 mg for weight 140lbs or > 30 tablet 1 prn at prn   polyethylene glycol (MIRALAX / GLYCOLAX) packet Take 17 g by mouth daily as needed (constipation).  (Patient not taking: No sig reported)      potassium chloride SA (KLOR-CON) 20 MEQ tablet Take 2 tablets (40 mEq total) by mouth 2 (two) times daily. 120 tablet 1 12/11/2020 at 2000   warfarin (COUMADIN) 5 MG tablet Take 2.5 mg by mouth as directed. 5 days weekly. All  days except Monday and Friday (Patient not taking: No sig reported)   Not Taking   Scheduled:   allopurinol  100 mg Oral Daily   azelastine  1 spray Each Nare BID   fluticasone  1 spray Each Nare Daily   montelukast  10 mg Oral QHS   predniSONE  10 mg  Oral Q breakfast   sodium chloride flush  3 mL Intravenous Q12H   tamsulosin  0.8 mg Oral QPC supper   torsemide  20 mg Oral Daily   vitamin B-12  1,000 mcg Oral Daily   Warfarin - Pharmacist Dosing Inpatient   Does not apply q1600   Infusions:   sodium chloride     PRN: sodium chloride, ALPRAZolam, morphine injection, ondansetron **OR** ondansetron (ZOFRAN) IV, oxyCODONE, sodium chloride flush Anti-infectives (From admission, onward)    None       Assessment: [name] with PMH of permanent atrial fibrillation on warfarin presenting with nausea, diarrhea, and weakness. Last week, patient presented with SOB and associated leg swelling last week, determined to be related to nonischemic cardiomyopathy with an EF of 20-25%. Estimated CHADS2VASC 4. Pharmacy consulted to assist with warfarin management while patient is admitted.   Patient admitted with multiple electrolyte abnormalities including transaminitis (AST/ALT 432/361) which appears new. Abdominal ultrasound suggestive of cirrhosis. Transaminitis may also be explained by hepatic congestion.   Scr improving 2.22>>>1.44  LFT's finally trending down AST 432>>>1291>740 ALT 361>>>1229>1069  Warfarin PTA 5 mg Monday, Friday 2.5 mg all other days TWD = 22.5 mg  During admission last week, patient received the following:  Date    INR      Warfarin Dose  8/27     --          2.5 mg (PTA) 8/28     2.1       No dose given 8/29     1.9       5 mg 8/30     1.7       5 mg 8/31     1.6       5 mg 9/01     1.9       2.5 mg 09/02   2.2       '5mg'$  9/3       2    Warfarin dosing during this admission: Date INR Warfarin Dose  9/5 -- 5 mg (PTA)  9/6 2.5 1.5 mg  9/7 2.6 2.0 mg  9/8 2.6  1.5 mg  9/9 2.0 5 mg   HGB stable   DDI Allopurinol: enhanced anticoagulant effect (PTA med continued)  Goal of Therapy:  INR 2-3   Plan:  --Given decreasing signs of liver injury and improved oral intake, will resume home dosing today --Daily INR per protocol --CBC at least every 3 days per protocol   Lu Duffel, PharmD, BCPS Clinical Pharmacist 12/16/2020 9:33 AM

## 2020-12-16 NOTE — Progress Notes (Signed)
Pt discharged to home. DC instructions given with daughter at bedside. No concerns voiced. Pt left unit in wheelchair pushed by hospital volunteer.Left in stable condition.  VWilliams,RN.

## 2020-12-18 ENCOUNTER — Other Ambulatory Visit: Payer: Self-pay | Admitting: Pulmonary Disease

## 2020-12-19 ENCOUNTER — Telehealth: Payer: Self-pay | Admitting: Family Medicine

## 2020-12-19 ENCOUNTER — Other Ambulatory Visit: Payer: Self-pay

## 2020-12-19 ENCOUNTER — Other Ambulatory Visit: Payer: Self-pay | Admitting: *Deleted

## 2020-12-19 DIAGNOSIS — I482 Chronic atrial fibrillation, unspecified: Secondary | ICD-10-CM

## 2020-12-19 DIAGNOSIS — G4700 Insomnia, unspecified: Secondary | ICD-10-CM | POA: Insufficient documentation

## 2020-12-19 DIAGNOSIS — Z79899 Other long term (current) drug therapy: Secondary | ICD-10-CM

## 2020-12-19 DIAGNOSIS — R7401 Elevation of levels of liver transaminase levels: Secondary | ICD-10-CM

## 2020-12-19 DIAGNOSIS — I5022 Chronic systolic (congestive) heart failure: Secondary | ICD-10-CM

## 2020-12-19 DIAGNOSIS — I428 Other cardiomyopathies: Secondary | ICD-10-CM

## 2020-12-19 DIAGNOSIS — F419 Anxiety disorder, unspecified: Secondary | ICD-10-CM

## 2020-12-19 DIAGNOSIS — Z95810 Presence of automatic (implantable) cardiac defibrillator: Secondary | ICD-10-CM

## 2020-12-19 MED ORDER — HYDROXYZINE HCL 25 MG PO TABS: 12.5000 mg | ORAL_TABLET | Freq: Two times a day (BID) | ORAL | 0 refills | Status: AC | PRN

## 2020-12-19 NOTE — Telephone Encounter (Signed)
Patient Daughter [name] called in stated patient is not sleeping . No longer want to give Xanax medication.would like a call back # [phone] . Please Advise

## 2020-12-19 NOTE — Telephone Encounter (Signed)
Probably the next best thing to try is hydroxyzine for anxiety/sleep which is similar to benadryl.  It is anticholinergic so we have to watch for dry mouth, constipation, blurry vision, or increased unsteadiness.  Start at 1/2 tab at night and update [place] with effect.

## 2020-12-19 NOTE — Addendum Note (Signed)
Addended by: Ria Bush on: 12/19/2020 05:40 PM   Modules accepted: Orders

## 2020-12-19 NOTE — Telephone Encounter (Signed)
Spoke with pt's daughter, Coralyn Mark (on dpr), asking about message.  States pt is not sleeping.  Says he stays anxious and walks during the night and eating.  Says they don't want to give him Xanax due to it causing pt to hear/seeing things.  Tried OTC melatonin, not helpful.  Wants to know what else can someone his age can take.  Says he's just always nervous and worrying things.  Plz advise.  Gives permission to lvm.

## 2020-12-20 ENCOUNTER — Telehealth: Payer: Self-pay | Admitting: *Deleted

## 2020-12-20 ENCOUNTER — Encounter: Payer: Self-pay | Admitting: Family

## 2020-12-20 NOTE — Telephone Encounter (Signed)
Terry returned Lisa's call.

## 2020-12-20 NOTE — Telephone Encounter (Signed)
Lvm asking pt's daughter, Coralyn Mark (on dpr), to call back.  Need to relay Dr. Synthia Innocent message.

## 2020-12-20 NOTE — Chronic Care Management (AMB) (Signed)
  Chronic Care Management   Note  12/20/2020 Name: [name] MRN: [id number] DOB: 1931-09-22  [name] is a 87 y.o. year old male who is a primary care patient of Ria Bush, MD. I reached out to Sherryle Lis by phone today in response to a referral sent by [name] PCP Ria Bush, MD     [name] was given information about Chronic Care Management services today including:  CCM service includes personalized support from designated clinical staff supervised by his physician, including individualized plan of care and coordination with other care providers 24/7 contact phone numbers for assistance for urgent and routine care needs. Service will only be billed when office clinical staff spend 20 minutes or more in a month to coordinate care. Only one practitioner may furnish and bill the service in a calendar month. The patient may stop CCM services at any time (effective at the end of the month) by phone call to the office staff. The patient will be responsible for cost sharing (co-pay) of up to 20% of the service fee (after annual deductible is met).  Patient agreed to services and verbal consent obtained.   Follow up plan: Telephone appointment with care management team member scheduled for: 12/26/2020  Julian Hy, [place] Management  Direct Dial: [phone]

## 2020-12-21 ENCOUNTER — Ambulatory Visit (INDEPENDENT_AMBULATORY_CARE_PROVIDER_SITE_OTHER): Payer: Medicare HMO

## 2020-12-21 ENCOUNTER — Other Ambulatory Visit (INDEPENDENT_AMBULATORY_CARE_PROVIDER_SITE_OTHER): Payer: Medicare HMO | Admitting: *Deleted

## 2020-12-21 ENCOUNTER — Other Ambulatory Visit: Payer: Self-pay

## 2020-12-21 ENCOUNTER — Ambulatory Visit: Payer: Medicare HMO | Admitting: Family

## 2020-12-21 ENCOUNTER — Ambulatory Visit: Payer: Medicare HMO

## 2020-12-21 DIAGNOSIS — Z79899 Other long term (current) drug therapy: Secondary | ICD-10-CM

## 2020-12-21 DIAGNOSIS — Z5181 Encounter for therapeutic drug level monitoring: Secondary | ICD-10-CM | POA: Diagnosis not present

## 2020-12-21 DIAGNOSIS — R7401 Elevation of levels of liver transaminase levels: Secondary | ICD-10-CM

## 2020-12-21 DIAGNOSIS — I4892 Unspecified atrial flutter: Secondary | ICD-10-CM | POA: Diagnosis not present

## 2020-12-21 DIAGNOSIS — Z7901 Long term (current) use of anticoagulants: Secondary | ICD-10-CM | POA: Diagnosis not present

## 2020-12-21 DIAGNOSIS — I428 Other cardiomyopathies: Secondary | ICD-10-CM | POA: Diagnosis not present

## 2020-12-21 LAB — CUP PACEART REMOTE DEVICE CHECK
Battery Remaining Longevity: 10 mo
Battery Voltage: 2.85 V
Brady Statistic RA Percent Paced: 0.02 %
Brady Statistic RV Percent Paced: 89.64 %
Date Time Interrogation Session: 20220914043623
HighPow Impedance: 38 Ohm
HighPow Impedance: 52 Ohm
Implantable Lead Implant Date: 20070525
Implantable Lead Implant Date: 20070525
Implantable Lead Implant Date: 20070525
Implantable Lead Location: 753858
Implantable Lead Location: 753859
Implantable Lead Location: 753860
Implantable Lead Model: 4194
Implantable Lead Model: 5076
Implantable Lead Model: 6949
Implantable Pulse Generator Implant Date: 20170116
Lead Channel Impedance Value: 228 Ohm
Lead Channel Impedance Value: 304 Ohm
Lead Channel Impedance Value: 342 Ohm
Lead Channel Impedance Value: 456 Ohm
Lead Channel Impedance Value: 475 Ohm
Lead Channel Impedance Value: 475 Ohm
Lead Channel Pacing Threshold Amplitude: 0.75 V
Lead Channel Pacing Threshold Amplitude: 0.75 V
Lead Channel Pacing Threshold Pulse Width: 0.4 ms
Lead Channel Pacing Threshold Pulse Width: 0.4 ms
Lead Channel Sensing Intrinsic Amplitude: 1.625 mV
Lead Channel Sensing Intrinsic Amplitude: 1.625 mV
Lead Channel Sensing Intrinsic Amplitude: 10 mV
Lead Channel Sensing Intrinsic Amplitude: 10 mV
Lead Channel Setting Pacing Amplitude: 2 V
Lead Channel Setting Pacing Amplitude: 2 V
Lead Channel Setting Pacing Amplitude: 2 V
Lead Channel Setting Pacing Pulse Width: 0.4 ms
Lead Channel Setting Pacing Pulse Width: 0.4 ms
Lead Channel Setting Sensing Sensitivity: 0.45 mV

## 2020-12-21 LAB — POCT INR: INR: 4.7 — AB (ref 2.0–3.0)

## 2020-12-21 NOTE — Patient Instructions (Signed)
-   skip warfarin tonight and tomorrow, then  - START NEW DOSAGE  of warfarin of 1/2 tablet daily - Recheck INR in 2 weeks

## 2020-12-22 LAB — COMPREHENSIVE METABOLIC PANEL
ALT: 254 IU/L — ABNORMAL HIGH (ref 0–44)
AST: 39 IU/L (ref 0–40)
Albumin/Globulin Ratio: 2.2 (ref 1.2–2.2)
Albumin: 3.8 g/dL (ref 3.6–4.6)
Alkaline Phosphatase: 99 IU/L (ref 44–121)
BUN/Creatinine Ratio: 20 (ref 10–24)
BUN: 26 mg/dL (ref 8–27)
Bilirubin Total: 1.5 mg/dL — ABNORMAL HIGH (ref 0.0–1.2)
CO2: 23 mmol/L (ref 20–29)
Calcium: 8.5 mg/dL — ABNORMAL LOW (ref 8.6–10.2)
Chloride: 90 mmol/L — ABNORMAL LOW (ref 96–106)
Creatinine, Ser: 1.33 mg/dL — ABNORMAL HIGH (ref 0.76–1.27)
Globulin, Total: 1.7 g/dL (ref 1.5–4.5)
Glucose: 94 mg/dL (ref 65–99)
Potassium: 4.1 mmol/L (ref 3.5–5.2)
Sodium: 131 mmol/L — ABNORMAL LOW (ref 134–144)
Total Protein: 5.5 g/dL — ABNORMAL LOW (ref 6.0–8.5)
eGFR: 52 mL/min/{1.73_m2} — ABNORMAL LOW (ref >59)

## 2020-12-23 ENCOUNTER — Encounter: Payer: Self-pay | Admitting: Family Medicine

## 2020-12-23 ENCOUNTER — Telehealth: Payer: Self-pay | Admitting: Cardiology

## 2020-12-23 NOTE — Telephone Encounter (Signed)
Called and spoke with his son.  He is very anxious and has nausea.  Labs are better a couple of days ago were better than previous.  He is breathing OK but very depressed.  His family is asking about hospice and I think that that is very appropriate and would help with comfort measures that are so important to him.  We will call a consult.

## 2020-12-23 NOTE — Telephone Encounter (Signed)
Spoke with wife.  Fully off xanax.  Trying full hydroxyzine '25mg'$  nightly with some benefit.  Advised to keep [place] updated with how he's doing.  Cardiology is getting hospice set up for him.

## 2020-12-24 NOTE — Telephone Encounter (Signed)
Spoke to pt's son, Octavia Bruckner. He did get the hydroxyzine that was sent in. He was able to sleep last night with it. The problem he is having right now is nausea. Zofran is not helping. When he tries to rest, he moans a lot. Son is asking about phenergan suppositories that could help with nausea and day time resting. Also, he believes Dr [name] is doing a hospice referral for the pt. They are aware it may be Monday before we can get back to them. But, I will be checking messages tomorrow and will reach out if I get a response. They do use his MyChart. So, if Dr Darnell Level. Does want to send in the suppositories, he can send a MyChart message or call.

## 2020-12-25 ENCOUNTER — Other Ambulatory Visit: Payer: Self-pay | Admitting: Cardiology

## 2020-12-26 ENCOUNTER — Telehealth: Payer: Medicare HMO

## 2020-12-26 MED ORDER — PROMETHAZINE HCL 25 MG RE SUPP: 25.0000 mg | Freq: Two times a day (BID) | RECTAL | 0 refills | Status: AC | PRN

## 2020-12-26 NOTE — Telephone Encounter (Signed)
Patient's son [name] notified by telephone as instructed. Patient's son stated that they have not heard anything from Hospice yet and would like for an urgent referral to be sent.   [name] stated that his dad has a telephone appointment scheduled  today at 1:00 with [facility]-CCM case manager and wants to know if this needs to be cancelled since Hospice will be coming in.

## 2020-12-26 NOTE — Addendum Note (Signed)
Addended by: Ria Bush on: 12/26/2020 09:48 AM   Modules accepted: Orders

## 2020-12-26 NOTE — Telephone Encounter (Signed)
Patient's son notified as instructed by telephone and verbalized understanding. Patient's son Octavia Bruckner stated that he does not have a phone number to cancel the appointment today at 1:00 pm. Appointment cancelled per patient's son's request.

## 2020-12-26 NOTE — Telephone Encounter (Signed)
Phenergan suppositories sent in. Let me know if they haven't heard from hospice and I will place an urgent referral.

## 2020-12-26 NOTE — Addendum Note (Signed)
Addended by: Ria Bush on: 12/26/2020 12:33 AM   Modules accepted: Orders

## 2020-12-26 NOTE — Telephone Encounter (Signed)
Stat hospice referral placed. Will forward to referral coordinator. Yes I think CCM visit can be cancelled.

## 2020-12-27 ENCOUNTER — Telehealth: Payer: Self-pay | Admitting: Family Medicine

## 2020-12-27 NOTE — Telephone Encounter (Signed)
Patient referral sent to [facility] They are working on start of care ASAP They reached out to the patient last night 12/26/20 and care was accepted by the patients.

## 2020-12-27 NOTE — Telephone Encounter (Signed)
[facility] 12/26/2020 First visit is scheduled for today 12/27/20  Nothing further needed.

## 2020-12-27 NOTE — Telephone Encounter (Signed)
[name] from [facility] [employer] called in stated Pt has accepted hospice services at 7pm last night would like a call back #[phone]

## 2020-12-28 NOTE — Telephone Encounter (Signed)
Successful telephone encounter to patients son, Chandan Fly (on [place]) to follow up on his and patient's wishes to discontinue tachy therapies secondary to hospice care admission. Discussed with Dr. Lovena Le who agrees and provides verbal order to discontinue tachy therapies and alerts. Industry consulted and made aware of order. Son updated.

## 2020-12-28 NOTE — Progress Notes (Signed)
Remote ICD transmission.   

## 2020-12-29 ENCOUNTER — Encounter: Payer: Self-pay | Admitting: Family

## 2020-12-29 NOTE — Telephone Encounter (Signed)
MD called and spoke with son 12/29/20

## 2021-01-02 ENCOUNTER — Other Ambulatory Visit: Payer: Self-pay

## 2021-01-03 DIAGNOSIS — R404 Transient alteration of awareness: Secondary | ICD-10-CM | POA: Diagnosis not present

## 2021-01-03 DIAGNOSIS — R0902 Hypoxemia: Secondary | ICD-10-CM | POA: Diagnosis not present

## 2021-01-03 DIAGNOSIS — Z743 Need for continuous supervision: Secondary | ICD-10-CM | POA: Diagnosis not present

## 2021-01-04 ENCOUNTER — Ambulatory Visit: Payer: Medicare HMO | Admitting: Family

## 2021-01-07 DEATH — deceased

## 2021-01-09 ENCOUNTER — Telehealth: Payer: Self-pay | Admitting: Family Medicine

## 2021-01-09 NOTE — Telephone Encounter (Addendum)
Spoke with wife Vickii Chafe, offered my condolences.  Pt passed away at [facility] on 12/16/2020

## 2021-02-21 ENCOUNTER — Encounter: Payer: Medicare HMO | Admitting: Internal Medicine

## 2021-05-29 ENCOUNTER — Ambulatory Visit: Payer: Medicare HMO | Admitting: Cardiology

## 2021-06-08 ENCOUNTER — Telehealth: Payer: Self-pay

## 2021-06-08 NOTE — Telephone Encounter (Signed)
Received letter from denial from insurance. Daughter called wanted to make sure that there was no balance due on his account or if any information was needed.  ?

## 2021-07-06 ENCOUNTER — Ambulatory Visit: Payer: Self-pay | Admitting: Urology

## 2021-10-27 NOTE — Telephone Encounter (Signed)
This encounter was created in error - please disregard.
